# Patient Record
Sex: Female | Born: 1945 | Race: White | Hispanic: No | Smoking: Former smoker
Health system: Southern US, Community
[De-identification: ages and names within clinical notes are randomized; demographics above are authoritative.]

## PROBLEM LIST (undated history)

## (undated) ENCOUNTER — Ambulatory Visit: Admission: EM

## (undated) DIAGNOSIS — I639 Cerebral infarction, unspecified: Secondary | ICD-10-CM

## (undated) DIAGNOSIS — E1136 Type 2 diabetes mellitus with diabetic cataract: Secondary | ICD-10-CM

## (undated) DIAGNOSIS — K209 Esophagitis, unspecified without bleeding: Secondary | ICD-10-CM

## (undated) DIAGNOSIS — A0472 Enterocolitis due to Clostridium difficile, not specified as recurrent: Secondary | ICD-10-CM

## (undated) DIAGNOSIS — F419 Anxiety disorder, unspecified: Secondary | ICD-10-CM

## (undated) DIAGNOSIS — I739 Peripheral vascular disease, unspecified: Secondary | ICD-10-CM

## (undated) DIAGNOSIS — M1990 Unspecified osteoarthritis, unspecified site: Secondary | ICD-10-CM

## (undated) DIAGNOSIS — M869 Osteomyelitis, unspecified: Secondary | ICD-10-CM

## (undated) DIAGNOSIS — E538 Deficiency of other specified B group vitamins: Secondary | ICD-10-CM

## (undated) DIAGNOSIS — E782 Mixed hyperlipidemia: Secondary | ICD-10-CM

## (undated) DIAGNOSIS — J302 Other seasonal allergic rhinitis: Secondary | ICD-10-CM

## (undated) DIAGNOSIS — I10 Essential (primary) hypertension: Secondary | ICD-10-CM

## (undated) HISTORY — DX: Anxiety disorder, unspecified: F41.9

## (undated) HISTORY — PX: TONSILLECTOMY AND ADENOIDECTOMY: SUR1326

## (undated) HISTORY — DX: Esophagitis, unspecified without bleeding: K20.90

## (undated) HISTORY — DX: Cerebral infarction, unspecified: I63.9

## (undated) HISTORY — DX: Enterocolitis due to Clostridium difficile, not specified as recurrent: A04.72

## (undated) HISTORY — DX: Deficiency of other specified B group vitamins: E53.8

## (undated) HISTORY — PX: ABDOMINAL HYSTERECTOMY: SHX81

## (undated) HISTORY — DX: Peripheral vascular disease, unspecified: I73.9

## (undated) HISTORY — DX: Mixed hyperlipidemia: E78.2

## (undated) HISTORY — DX: Essential (primary) hypertension: I10

## (undated) HISTORY — DX: Type 2 diabetes mellitus with diabetic cataract: E11.36

---

## 1966-05-30 HISTORY — PX: PARTIAL HYMENECTOMY: SHX327

## 1985-05-30 HISTORY — PX: VAGINAL HYSTERECTOMY: SUR661

## 1988-05-30 DIAGNOSIS — I10 Essential (primary) hypertension: Secondary | ICD-10-CM

## 1988-05-30 HISTORY — DX: Essential (primary) hypertension: I10

## 2000-05-30 DIAGNOSIS — E538 Deficiency of other specified B group vitamins: Secondary | ICD-10-CM | POA: Insufficient documentation

## 2000-05-30 DIAGNOSIS — E1136 Type 2 diabetes mellitus with diabetic cataract: Secondary | ICD-10-CM

## 2000-05-30 HISTORY — DX: Type 2 diabetes mellitus with diabetic cataract: E11.36

## 2008-05-30 DIAGNOSIS — E782 Mixed hyperlipidemia: Secondary | ICD-10-CM

## 2008-05-30 DIAGNOSIS — I739 Peripheral vascular disease, unspecified: Secondary | ICD-10-CM | POA: Insufficient documentation

## 2008-05-30 HISTORY — DX: Mixed hyperlipidemia: E78.2

## 2015-07-15 DIAGNOSIS — M5020 Other cervical disc displacement, unspecified cervical region: Secondary | ICD-10-CM | POA: Diagnosis not present

## 2015-07-15 DIAGNOSIS — M5412 Radiculopathy, cervical region: Secondary | ICD-10-CM | POA: Diagnosis not present

## 2015-07-18 DIAGNOSIS — M5412 Radiculopathy, cervical region: Secondary | ICD-10-CM | POA: Diagnosis not present

## 2015-07-18 DIAGNOSIS — M5020 Other cervical disc displacement, unspecified cervical region: Secondary | ICD-10-CM | POA: Diagnosis not present

## 2015-07-19 DIAGNOSIS — M5020 Other cervical disc displacement, unspecified cervical region: Secondary | ICD-10-CM | POA: Diagnosis not present

## 2015-07-19 DIAGNOSIS — M5412 Radiculopathy, cervical region: Secondary | ICD-10-CM | POA: Diagnosis not present

## 2015-07-21 DIAGNOSIS — M5020 Other cervical disc displacement, unspecified cervical region: Secondary | ICD-10-CM | POA: Diagnosis not present

## 2015-07-21 DIAGNOSIS — M5412 Radiculopathy, cervical region: Secondary | ICD-10-CM | POA: Diagnosis not present

## 2015-07-22 DIAGNOSIS — M5412 Radiculopathy, cervical region: Secondary | ICD-10-CM | POA: Diagnosis not present

## 2015-07-22 DIAGNOSIS — M5020 Other cervical disc displacement, unspecified cervical region: Secondary | ICD-10-CM | POA: Diagnosis not present

## 2015-07-25 DIAGNOSIS — M5412 Radiculopathy, cervical region: Secondary | ICD-10-CM | POA: Diagnosis not present

## 2015-07-25 DIAGNOSIS — M5020 Other cervical disc displacement, unspecified cervical region: Secondary | ICD-10-CM | POA: Diagnosis not present

## 2015-07-29 DIAGNOSIS — M5412 Radiculopathy, cervical region: Secondary | ICD-10-CM | POA: Diagnosis not present

## 2015-07-29 DIAGNOSIS — M5020 Other cervical disc displacement, unspecified cervical region: Secondary | ICD-10-CM | POA: Diagnosis not present

## 2015-08-01 DIAGNOSIS — M5412 Radiculopathy, cervical region: Secondary | ICD-10-CM | POA: Diagnosis not present

## 2015-08-01 DIAGNOSIS — M5020 Other cervical disc displacement, unspecified cervical region: Secondary | ICD-10-CM | POA: Diagnosis not present

## 2015-08-05 DIAGNOSIS — M5020 Other cervical disc displacement, unspecified cervical region: Secondary | ICD-10-CM | POA: Diagnosis not present

## 2015-08-05 DIAGNOSIS — M5412 Radiculopathy, cervical region: Secondary | ICD-10-CM | POA: Diagnosis not present

## 2015-08-12 DIAGNOSIS — M5020 Other cervical disc displacement, unspecified cervical region: Secondary | ICD-10-CM | POA: Diagnosis not present

## 2015-08-12 DIAGNOSIS — M5412 Radiculopathy, cervical region: Secondary | ICD-10-CM | POA: Diagnosis not present

## 2015-08-13 DIAGNOSIS — M5412 Radiculopathy, cervical region: Secondary | ICD-10-CM | POA: Diagnosis not present

## 2015-08-13 DIAGNOSIS — M5020 Other cervical disc displacement, unspecified cervical region: Secondary | ICD-10-CM | POA: Diagnosis not present

## 2015-08-14 DIAGNOSIS — M5412 Radiculopathy, cervical region: Secondary | ICD-10-CM | POA: Diagnosis not present

## 2015-08-14 DIAGNOSIS — M5020 Other cervical disc displacement, unspecified cervical region: Secondary | ICD-10-CM | POA: Diagnosis not present

## 2015-08-19 DIAGNOSIS — M5412 Radiculopathy, cervical region: Secondary | ICD-10-CM | POA: Diagnosis not present

## 2015-08-19 DIAGNOSIS — M5020 Other cervical disc displacement, unspecified cervical region: Secondary | ICD-10-CM | POA: Diagnosis not present

## 2015-11-20 DIAGNOSIS — M9901 Segmental and somatic dysfunction of cervical region: Secondary | ICD-10-CM | POA: Diagnosis not present

## 2015-11-20 DIAGNOSIS — M5020 Other cervical disc displacement, unspecified cervical region: Secondary | ICD-10-CM | POA: Diagnosis not present

## 2015-11-25 DIAGNOSIS — B372 Candidiasis of skin and nail: Secondary | ICD-10-CM | POA: Diagnosis not present

## 2015-11-25 DIAGNOSIS — E1129 Type 2 diabetes mellitus with other diabetic kidney complication: Secondary | ICD-10-CM | POA: Diagnosis not present

## 2015-11-25 DIAGNOSIS — Z1389 Encounter for screening for other disorder: Secondary | ICD-10-CM | POA: Diagnosis not present

## 2015-11-25 DIAGNOSIS — Z6833 Body mass index (BMI) 33.0-33.9, adult: Secondary | ICD-10-CM | POA: Diagnosis not present

## 2015-11-25 DIAGNOSIS — E7800 Pure hypercholesterolemia, unspecified: Secondary | ICD-10-CM | POA: Diagnosis not present

## 2015-11-25 DIAGNOSIS — I10 Essential (primary) hypertension: Secondary | ICD-10-CM | POA: Diagnosis not present

## 2016-01-19 DIAGNOSIS — E1165 Type 2 diabetes mellitus with hyperglycemia: Secondary | ICD-10-CM | POA: Diagnosis not present

## 2016-01-19 DIAGNOSIS — E782 Mixed hyperlipidemia: Secondary | ICD-10-CM | POA: Diagnosis not present

## 2016-01-19 DIAGNOSIS — I10 Essential (primary) hypertension: Secondary | ICD-10-CM | POA: Diagnosis not present

## 2016-04-26 DIAGNOSIS — E1165 Type 2 diabetes mellitus with hyperglycemia: Secondary | ICD-10-CM | POA: Diagnosis not present

## 2016-04-26 DIAGNOSIS — E782 Mixed hyperlipidemia: Secondary | ICD-10-CM | POA: Diagnosis not present

## 2016-04-26 DIAGNOSIS — I10 Essential (primary) hypertension: Secondary | ICD-10-CM | POA: Diagnosis not present

## 2016-04-27 DIAGNOSIS — Z23 Encounter for immunization: Secondary | ICD-10-CM | POA: Diagnosis not present

## 2016-06-29 DIAGNOSIS — E1165 Type 2 diabetes mellitus with hyperglycemia: Secondary | ICD-10-CM | POA: Diagnosis not present

## 2016-08-08 DIAGNOSIS — E1165 Type 2 diabetes mellitus with hyperglycemia: Secondary | ICD-10-CM | POA: Diagnosis not present

## 2016-08-08 DIAGNOSIS — E782 Mixed hyperlipidemia: Secondary | ICD-10-CM | POA: Diagnosis not present

## 2016-08-08 DIAGNOSIS — I10 Essential (primary) hypertension: Secondary | ICD-10-CM | POA: Diagnosis not present

## 2016-08-31 DIAGNOSIS — B370 Candidal stomatitis: Secondary | ICD-10-CM | POA: Diagnosis not present

## 2016-08-31 DIAGNOSIS — J208 Acute bronchitis due to other specified organisms: Secondary | ICD-10-CM | POA: Diagnosis not present

## 2016-11-10 DIAGNOSIS — I10 Essential (primary) hypertension: Secondary | ICD-10-CM | POA: Diagnosis not present

## 2016-11-10 DIAGNOSIS — E782 Mixed hyperlipidemia: Secondary | ICD-10-CM | POA: Diagnosis not present

## 2016-11-10 DIAGNOSIS — E119 Type 2 diabetes mellitus without complications: Secondary | ICD-10-CM | POA: Diagnosis not present

## 2017-01-06 DIAGNOSIS — M19041 Primary osteoarthritis, right hand: Secondary | ICD-10-CM | POA: Diagnosis not present

## 2017-01-06 DIAGNOSIS — S6991XA Unspecified injury of right wrist, hand and finger(s), initial encounter: Secondary | ICD-10-CM | POA: Diagnosis not present

## 2017-01-06 DIAGNOSIS — M25531 Pain in right wrist: Secondary | ICD-10-CM | POA: Diagnosis not present

## 2017-01-06 DIAGNOSIS — M79641 Pain in right hand: Secondary | ICD-10-CM | POA: Diagnosis not present

## 2017-01-12 ENCOUNTER — Other Ambulatory Visit: Payer: Self-pay | Admitting: Pharmacy Technician

## 2017-01-12 NOTE — Patient Outreach (Signed)
[name] Care Management  01/12/2017  Lauren Craig Dec 28, 1945 [id number]  I attempted to contact patient in reference to medication adherence for [employer]. The medication in question is Atorvastatin 20 mg which was last filled 08/08/2016. I also wanted to inquire whether or not patient is aware of the 90 day option that is available to her which would save her a co-pay on her medication's. I left a voicemail and requested for the patient to return my call.  [name], [place] [phone]

## 2017-01-31 DIAGNOSIS — Z01818 Encounter for other preprocedural examination: Secondary | ICD-10-CM | POA: Diagnosis not present

## 2017-01-31 DIAGNOSIS — H25812 Combined forms of age-related cataract, left eye: Secondary | ICD-10-CM | POA: Diagnosis not present

## 2017-02-17 DIAGNOSIS — E782 Mixed hyperlipidemia: Secondary | ICD-10-CM | POA: Diagnosis not present

## 2017-02-17 DIAGNOSIS — I10 Essential (primary) hypertension: Secondary | ICD-10-CM | POA: Diagnosis not present

## 2017-02-17 DIAGNOSIS — Z794 Long term (current) use of insulin: Secondary | ICD-10-CM | POA: Diagnosis not present

## 2017-02-17 DIAGNOSIS — Z23 Encounter for immunization: Secondary | ICD-10-CM | POA: Diagnosis not present

## 2017-02-17 DIAGNOSIS — E1136 Type 2 diabetes mellitus with diabetic cataract: Secondary | ICD-10-CM | POA: Diagnosis not present

## 2017-02-17 DIAGNOSIS — E119 Type 2 diabetes mellitus without complications: Secondary | ICD-10-CM | POA: Diagnosis not present

## 2017-05-25 DIAGNOSIS — I10 Essential (primary) hypertension: Secondary | ICD-10-CM | POA: Diagnosis not present

## 2017-05-25 DIAGNOSIS — E782 Mixed hyperlipidemia: Secondary | ICD-10-CM | POA: Diagnosis not present

## 2017-05-25 DIAGNOSIS — E1136 Type 2 diabetes mellitus with diabetic cataract: Secondary | ICD-10-CM | POA: Diagnosis not present

## 2017-07-05 DIAGNOSIS — M79662 Pain in left lower leg: Secondary | ICD-10-CM | POA: Diagnosis not present

## 2017-07-05 DIAGNOSIS — M79661 Pain in right lower leg: Secondary | ICD-10-CM | POA: Diagnosis not present

## 2017-07-05 DIAGNOSIS — I10 Essential (primary) hypertension: Secondary | ICD-10-CM | POA: Diagnosis not present

## 2017-07-19 DIAGNOSIS — I7789 Other specified disorders of arteries and arterioles: Secondary | ICD-10-CM | POA: Diagnosis not present

## 2017-07-19 DIAGNOSIS — M79661 Pain in right lower leg: Secondary | ICD-10-CM | POA: Diagnosis not present

## 2017-07-19 DIAGNOSIS — I70211 Atherosclerosis of native arteries of extremities with intermittent claudication, right leg: Secondary | ICD-10-CM | POA: Diagnosis not present

## 2017-07-19 DIAGNOSIS — I70212 Atherosclerosis of native arteries of extremities with intermittent claudication, left leg: Secondary | ICD-10-CM | POA: Diagnosis not present

## 2017-07-24 ENCOUNTER — Encounter (HOSPITAL_COMMUNITY): Payer: Self-pay

## 2017-07-24 ENCOUNTER — Encounter: Payer: Self-pay | Admitting: Surgery

## 2017-07-24 DIAGNOSIS — E782 Mixed hyperlipidemia: Secondary | ICD-10-CM

## 2017-08-02 ENCOUNTER — Other Ambulatory Visit: Payer: Self-pay

## 2017-08-02 ENCOUNTER — Ambulatory Visit (INDEPENDENT_AMBULATORY_CARE_PROVIDER_SITE_OTHER): Payer: PPO | Admitting: Vascular Surgery

## 2017-08-02 ENCOUNTER — Encounter: Payer: Self-pay | Admitting: *Deleted

## 2017-08-02 ENCOUNTER — Other Ambulatory Visit: Payer: Self-pay | Admitting: *Deleted

## 2017-08-02 ENCOUNTER — Encounter: Payer: Self-pay | Admitting: Vascular Surgery

## 2017-08-02 VITALS — BP 131/79 | HR 97 | Temp 97.9°F | Resp 16 | Ht 65.5 in | Wt 176.0 lb

## 2017-08-02 DIAGNOSIS — I739 Peripheral vascular disease, unspecified: Secondary | ICD-10-CM

## 2017-08-02 NOTE — Progress Notes (Signed)
Patient name: Lauren Craig MRN: [id number] DOB: 11/11/1945 Sex: female   REASON FOR CONSULT:    Peripheral vascular disease.  The consult is requested by Darrol Jump, PA  HPI:   Lauren Craig is a pleasant 72 y.o. female, who was referred for evaluation of peripheral vascular disease. I have reviewed the records that were sent from the referring office.  The patient was seen on February 6.  The patient is followed with hypertension which has been under good control.  She was complaining of Claudication which initially began 1-2 months ago.  Her symptoms were progressing and therefore she sent for vascular consultation.  On my history she describes the gradual onset of pain mostly in the left leg which is brought on by ambulation and relieved with rest.  This involves her left thigh and calf.  This occurs at a very short distance.  The symptoms have gradually progressed over the last year.  She does have some occasional right calf claudication.  She describes pain in both lower extremities at night but given that the symptoms are equal on both sides are not convinced that this represents rest pain.  She denies any history of nonhealing wounds.  Her risk factors for peripheral vascular disease include diabetes, hypertension, and hypercholesterolemia.  She denies any family history of premature cardiovascular disease.  She is not a smoker.  She is on aspirin, a statin, and Xarelto.  It is not clear to me why she is on Xarelto.  I do not see any studies to suggest that she has a DVT.  Past Medical History:  Diagnosis Date  . Essential (primary) hypertension 1990  . Mixed hyperlipidemia 2010  . 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 Diabetes Mellitus II Mother   . Hypertension Father   . Heart Problems Brother        pacemaker  . Diabetes Mellitus II Brother   . Bipolar disorder Brother     SOCIAL  HISTORY: Social History   Socioeconomic History  . Marital status: Divorced    Spouse name: Not on file  . Number of children: 2  . Years of education: Not on file  . Highest education level: Not on file  Social Needs  . Financial resource strain: Not on file  . Food insecurity - worry: Not on file  . Food insecurity - inability: Not on file  . Transportation needs - medical: Not on file  . Transportation needs - non-medical: Not on file  Occupational History  . Occupation: substitute Product manager: [employer]  . 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 Smoking status: Former Smoker    Types: Cigarettes    Last attempt to quit: 2004    Years since quitting: 15.1  . Smokeless tobacco: Never Used  Substance and Sexual Activity  . Alcohol use: No    Frequency: Never  . Drug use: No  . Sexual activity: Not on file  Other Topics Concern  . Not on file  Social History Narrative  . Not on file    Allergies  Allergen Reactions  . Penicillins Rash    Current Outpatient Medications  Medication Sig Dispense Refill  . aspirin 325 MG EC tablet Take 325 mg by mouth daily.     Marland Kitchen atorvastatin (  LIPITOR) 20 MG tablet Take 20 mg by mouth Nightly.    . cyclobenzaprine (FLEXERIL) 10 MG tablet Take 10 mg by mouth at bedtime.    . enalapril (VASOTEC) 2.5 MG tablet Take 2.5 mg by mouth daily.    Marland Kitchen gabapentin (NEURONTIN) 100 MG capsule Take 100 mg by mouth 2 (two) times daily.    . insulin aspart (NOVOLOG) 100 UNIT/ML FlexPen Inject 30 Units into the skin 2 (two) times daily. Inject 30-35 units BID    . Insulin Glargine (LANTUS SOLOSTAR) 100 UNIT/ML Solostar Pen Inject 50 Units into the skin every morning.    . rivaroxaban (XARELTO) 2.5 MG TABS tablet Take 2.5 mg by mouth 2 (two) times daily.    Marland Kitchen triamcinolone cream (KENALOG) 0.1 % Apply 1 application topically 2 (two) times daily.    Marland Kitchen  albuterol (PROVENTIL HFA;VENTOLIN HFA) 108 (90 Base) MCG/ACT inhaler Inhale 2 puffs into the lungs every 6 (six) hours as needed for wheezing or shortness of breath.    . Fluticasone Furoate 50 MCG/ACT AEPB Place 1-2 sprays into both nostrils daily.     No current facility-administered medications for this visit.     REVIEW OF SYSTEMS:  [X]  denotes positive finding, [ ]  denotes negative finding Cardiac  Comments:  Chest pain or chest pressure:    Shortness of breath upon exertion:    Short of breath when lying flat:    Irregular heart rhythm:        Vascular    Pain in calf, thigh, or hip brought on by ambulation: x   Pain in feet at night that wakes you up from your sleep:  x   Blood clot in your veins:    Leg swelling:  x       Pulmonary    Oxygen at home:    Productive cough:     Wheezing:         Neurologic    Sudden weakness in arms or legs:     Sudden numbness in arms or legs:     Sudden onset of difficulty speaking or slurred speech:    Temporary loss of vision in one eye:     Problems with dizziness:         Gastrointestinal    Blood in stool:     Vomited blood:         Genitourinary    Burning when urinating:     Blood in urine:        Psychiatric    Major depression:         Hematologic    Bleeding problems:    Problems with blood clotting too easily:        Skin    Rashes or ulcers:        Constitutional    Fever or chills:     PHYSICAL EXAM:   Vitals:   08/02/17 1355  BP: 131/79  Pulse: 97  Resp: 16  Temp: 97.9 F (36.6 C)  TempSrc: Oral  SpO2: 95%  Weight: 176 lb (79.8 kg)  Height: 5' 5.5" (1.664 m)    GENERAL: The patient is a well-nourished female, in no acute distress. The vital signs are documented above. CARDIAC: There is a regular rate and rhythm.  VASCULAR: I do not detect carotid bruits. On the left side, which is the symptomatic side, I cannot palpate a femoral, popliteal, or pedal pulses. On the right side, there is a  palpable femoral, popliteal, and dorsalis pedis  pulse. PULMONARY: There is good air exchange bilaterally without wheezing or rales. ABDOMEN: Soft and non-tender with normal pitched bowel sounds.  MUSCULOSKELETAL: There are no major deformities or cyanosis. NEUROLOGIC: No focal weakness or paresthesias are detected. SKIN: There are no ulcers or rashes noted. PSYCHIATRIC: The patient has a normal affect.  DATA:    ARTERIAL DOPPLER STUDY: I have reviewed the arterial Doppler study that was done on 07/19/2017.  On the right side ABI is 80%.  On the left side ABI is 41%.  There are monophasic signals in the common femoral artery on the left suggesting iliac artery occlusive disease.  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  Her symptoms are quite disabling.  We have discussed the option of conservative treatment including a structured walking program.  However, she feels that her symptoms are not tolerable and that this is not an option.  For this reason I recommended that we proceed with arteriography.  I have reviewed with the patient the indications for arteriography. In addition, I have reviewed the potential complications of arteriography including but not limited to: Bleeding, arterial injury, arterial thrombosis, dye action, renal insufficiency, or other unpredictable medical problems. I have explained to the patient that if we find disease amenable to angioplasty we could potentially address this at the same time. I have discussed the potential complications of angioplasty and stenting, including but not limited to: Bleeding, arterial thrombosis, arterial injury, dissection, or the need for surgical intervention.  We will stop her Xarelto today.  Her angiogram is scheduled for 08/11/2017.  I will  make further recommendations pending these results.  If she is not a candidate for endovascular approach she might be a candidate for a right to left femorofemoral bypass.  We will need to check her renal function preoperatively.  I am unable to find any recent lab work.  [name] [facility] [phone]

## 2017-08-02 NOTE — H&P (View-Only) (Signed)
Patient name: Lauren Craig MRN: [id number] DOB: 08-10-1945 Sex: female   REASON FOR CONSULT:    Peripheral vascular disease.  The consult is requested by Darrol Jump, PA  HPI:   Lauren Craig is a pleasant 72 y.o. female, who was referred for evaluation of peripheral vascular disease. I have reviewed the records that were sent from the referring office.  The patient was seen on February 6.  The patient is followed with hypertension which has been under good control.  She was complaining of Claudication which initially began 1-2 months ago.  Her symptoms were progressing and therefore she sent for vascular consultation.  On my history she describes the gradual onset of pain mostly in the left leg which is brought on by ambulation and relieved with rest.  This involves her left thigh and calf.  This occurs at a very short distance.  The symptoms have gradually progressed over the last year.  She does have some occasional right calf claudication.  She describes pain in both lower extremities at night but given that the symptoms are equal on both sides are not convinced that this represents rest pain.  She denies any history of nonhealing wounds.  Her risk factors for peripheral vascular disease include diabetes, hypertension, and hypercholesterolemia.  She denies any family history of premature cardiovascular disease.  She is not a smoker.  She is on aspirin, a statin, and Xarelto.  It is not clear to me why she is on Xarelto.  I do not see any studies to suggest that she has a DVT.  Past Medical History:  Diagnosis Date  . Essential (primary) hypertension 1990  . Mixed hyperlipidemia 2010  . 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 Diabetes Mellitus II Mother   . Hypertension Father   . Heart Problems Brother        pacemaker  . Diabetes Mellitus II Brother   . Bipolar disorder Brother     SOCIAL  HISTORY: Social History   Socioeconomic History  . Marital status: Divorced    Spouse name: Not on file  . Number of children: 2  . Years of education: Not on file  . Highest education level: Not on file  Social Needs  . Financial resource strain: Not on file  . Food insecurity - worry: Not on file  . Food insecurity - inability: Not on file  . Transportation needs - medical: Not on file  . Transportation needs - non-medical: Not on file  Occupational History  . Occupation: substitute Product manager: [employer]  . 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 Smoking status: Former Smoker    Types: Cigarettes    Last attempt to quit: 2004    Years since quitting: 15.1  . Smokeless tobacco: Never Used  Substance and Sexual Activity  . Alcohol use: No    Frequency: Never  . Drug use: No  . Sexual activity: Not on file  Other Topics Concern  . Not on file  Social History Narrative  . Not on file    Allergies  Allergen Reactions  . Penicillins Rash    Current Outpatient Medications  Medication Sig Dispense Refill  . aspirin 325 MG EC tablet Take 325 mg by mouth daily.     Marland Kitchen atorvastatin (  LIPITOR) 20 MG tablet Take 20 mg by mouth Nightly.    . cyclobenzaprine (FLEXERIL) 10 MG tablet Take 10 mg by mouth at bedtime.    . enalapril (VASOTEC) 2.5 MG tablet Take 2.5 mg by mouth daily.    Marland Kitchen gabapentin (NEURONTIN) 100 MG capsule Take 100 mg by mouth 2 (two) times daily.    . insulin aspart (NOVOLOG) 100 UNIT/ML FlexPen Inject 30 Units into the skin 2 (two) times daily. Inject 30-35 units BID    . Insulin Glargine (LANTUS SOLOSTAR) 100 UNIT/ML Solostar Pen Inject 50 Units into the skin every morning.    . rivaroxaban (XARELTO) 2.5 MG TABS tablet Take 2.5 mg by mouth 2 (two) times daily.    Marland Kitchen triamcinolone cream (KENALOG) 0.1 % Apply 1 application topically 2 (two) times daily.    Marland Kitchen  albuterol (PROVENTIL HFA;VENTOLIN HFA) 108 (90 Base) MCG/ACT inhaler Inhale 2 puffs into the lungs every 6 (six) hours as needed for wheezing or shortness of breath.    . Fluticasone Furoate 50 MCG/ACT AEPB Place 1-2 sprays into both nostrils daily.     No current facility-administered medications for this visit.     REVIEW OF SYSTEMS:  [X]  denotes positive finding, [ ]  denotes negative finding Cardiac  Comments:  Chest pain or chest pressure:    Shortness of breath upon exertion:    Short of breath when lying flat:    Irregular heart rhythm:        Vascular    Pain in calf, thigh, or hip brought on by ambulation: x   Pain in feet at night that wakes you up from your sleep:  x   Blood clot in your veins:    Leg swelling:  x       Pulmonary    Oxygen at home:    Productive cough:     Wheezing:         Neurologic    Sudden weakness in arms or legs:     Sudden numbness in arms or legs:     Sudden onset of difficulty speaking or slurred speech:    Temporary loss of vision in one eye:     Problems with dizziness:         Gastrointestinal    Blood in stool:     Vomited blood:         Genitourinary    Burning when urinating:     Blood in urine:        Psychiatric    Major depression:         Hematologic    Bleeding problems:    Problems with blood clotting too easily:        Skin    Rashes or ulcers:        Constitutional    Fever or chills:     PHYSICAL EXAM:   Vitals:   08/02/17 1355  BP: 131/79  Pulse: 97  Resp: 16  Temp: 97.9 F (36.6 C)  TempSrc: Oral  SpO2: 95%  Weight: 176 lb (79.8 kg)  Height: 5' 5.5" (1.664 m)    GENERAL: The patient is a well-nourished female, in no acute distress. The vital signs are documented above. CARDIAC: There is a regular rate and rhythm.  VASCULAR: I do not detect carotid bruits. On the left side, which is the symptomatic side, I cannot palpate a femoral, popliteal, or pedal pulses. On the right side, there is a  palpable femoral, popliteal, and dorsalis pedis  pulse. PULMONARY: There is good air exchange bilaterally without wheezing or rales. ABDOMEN: Soft and non-tender with normal pitched bowel sounds.  MUSCULOSKELETAL: There are no major deformities or cyanosis. NEUROLOGIC: No focal weakness or paresthesias are detected. SKIN: There are no ulcers or rashes noted. PSYCHIATRIC: The patient has a normal affect.  DATA:    ARTERIAL DOPPLER STUDY: I have reviewed the arterial Doppler study that was done on 07/19/2017.  On the right side ABI is 80%.  On the left side ABI is 41%.  There are monophasic signals in the common femoral artery on the left suggesting iliac artery occlusive disease.  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  Her symptoms are quite disabling.  We have discussed the option of conservative treatment including a structured walking program.  However, she feels that her symptoms are not tolerable and that this is not an option.  For this reason I recommended that we proceed with arteriography.  I have reviewed with the patient the indications for arteriography. In addition, I have reviewed the potential complications of arteriography including but not limited to: Bleeding, arterial injury, arterial thrombosis, dye action, renal insufficiency, or other unpredictable medical problems. I have explained to the patient that if we find disease amenable to angioplasty we could potentially address this at the same time. I have discussed the potential complications of angioplasty and stenting, including but not limited to: Bleeding, arterial thrombosis, arterial injury, dissection, or the need for surgical intervention.  We will stop her Xarelto today.  Her angiogram is scheduled for 08/11/2017.  I will  make further recommendations pending these results.  If she is not a candidate for endovascular approach she might be a candidate for a right to left femorofemoral bypass.  We will need to check her renal function preoperatively.  I am unable to find any recent lab work.  [name] [facility] [phone]

## 2017-08-07 ENCOUNTER — Encounter: Payer: Self-pay | Admitting: Medical

## 2017-08-11 ENCOUNTER — Encounter (HOSPITAL_COMMUNITY)
Admission: RE | Disposition: A | Discharge status: Home / Self Care | Payer: Self-pay | Source: Ambulatory Visit | Attending: Vascular Surgery

## 2017-08-11 ENCOUNTER — Ambulatory Visit (HOSPITAL_COMMUNITY)
Admission: RE | Admit: 2017-08-11 | Discharge: 2017-08-11 | Disposition: A | Discharge status: Home / Self Care | Payer: PPO | Source: Ambulatory Visit | Attending: Vascular Surgery | Admitting: Vascular Surgery

## 2017-08-11 DIAGNOSIS — E782 Mixed hyperlipidemia: Secondary | ICD-10-CM | POA: Insufficient documentation

## 2017-08-11 DIAGNOSIS — E669 Obesity, unspecified: Secondary | ICD-10-CM | POA: Insufficient documentation

## 2017-08-11 DIAGNOSIS — I70212 Atherosclerosis of native arteries of extremities with intermittent claudication, left leg: Secondary | ICD-10-CM | POA: Diagnosis not present

## 2017-08-11 DIAGNOSIS — Z794 Long term (current) use of insulin: Secondary | ICD-10-CM | POA: Insufficient documentation

## 2017-08-11 DIAGNOSIS — E1136 Type 2 diabetes mellitus with diabetic cataract: Secondary | ICD-10-CM | POA: Insufficient documentation

## 2017-08-11 DIAGNOSIS — Z7982 Long term (current) use of aspirin: Secondary | ICD-10-CM | POA: Diagnosis not present

## 2017-08-11 DIAGNOSIS — I745 Embolism and thrombosis of iliac artery: Secondary | ICD-10-CM | POA: Diagnosis not present

## 2017-08-11 DIAGNOSIS — E1151 Type 2 diabetes mellitus with diabetic peripheral angiopathy without gangrene: Secondary | ICD-10-CM | POA: Insufficient documentation

## 2017-08-11 DIAGNOSIS — Z87891 Personal history of nicotine dependence: Secondary | ICD-10-CM | POA: Diagnosis not present

## 2017-08-11 DIAGNOSIS — I708 Atherosclerosis of other arteries: Secondary | ICD-10-CM | POA: Diagnosis not present

## 2017-08-11 DIAGNOSIS — I10 Essential (primary) hypertension: Secondary | ICD-10-CM | POA: Diagnosis not present

## 2017-08-11 DIAGNOSIS — I739 Peripheral vascular disease, unspecified: Secondary | ICD-10-CM | POA: Diagnosis present

## 2017-08-11 DIAGNOSIS — Z8249 Family history of ischemic heart disease and other diseases of the circulatory system: Secondary | ICD-10-CM | POA: Insufficient documentation

## 2017-08-11 DIAGNOSIS — Z7901 Long term (current) use of anticoagulants: Secondary | ICD-10-CM | POA: Insufficient documentation

## 2017-08-11 DIAGNOSIS — Z79899 Other long term (current) drug therapy: Secondary | ICD-10-CM | POA: Insufficient documentation

## 2017-08-11 DIAGNOSIS — Z833 Family history of diabetes mellitus: Secondary | ICD-10-CM | POA: Insufficient documentation

## 2017-08-11 HISTORY — PX: ABDOMINAL AORTOGRAM W/LOWER EXTREMITY: CATH118223

## 2017-08-11 LAB — GLUCOSE, CAPILLARY
Glucose-Capillary: 266 mg/dL — ABNORMAL HIGH (ref 65–99)
Glucose-Capillary: 219 mg/dL — ABNORMAL HIGH (ref 65–99)

## 2017-08-11 LAB — POCT I-STAT, CHEM 8
BUN: 13 mg/dL (ref 6–20)
CREATININE: 0.6 mg/dL (ref 0.44–1.00)
Calcium, Ion: 1.14 mmol/L — ABNORMAL LOW (ref 1.15–1.40)
Chloride: 101 mmol/L (ref 101–111)
Glucose, Bld: 341 mg/dL — ABNORMAL HIGH (ref 65–99)
HEMATOCRIT: 43 % (ref 36.0–46.0)
HEMOGLOBIN: 14.6 g/dL (ref 12.0–15.0)
POTASSIUM: 4.1 mmol/L (ref 3.5–5.1)
SODIUM: 136 mmol/L (ref 135–145)
TCO2: 25 mmol/L (ref 22–32)

## 2017-08-11 SURGERY — ABDOMINAL AORTOGRAM W/LOWER EXTREMITY
Anesthesia: LOCAL

## 2017-08-11 MED ORDER — SODIUM CHLORIDE 0.9% FLUSH: 3.0000 mL | Freq: Two times a day (BID) | INTRAVENOUS | Status: DC

## 2017-08-11 MED ORDER — LABETALOL HCL 5 MG/ML IV SOLN: 10.0000 mg | INTRAVENOUS | Status: DC | PRN

## 2017-08-11 MED ORDER — SODIUM CHLORIDE 0.9 % IV SOLN: 250.0000 mL | INTRAVENOUS | Status: DC | PRN

## 2017-08-11 MED ORDER — INSULIN ASPART 100 UNIT/ML ~~LOC~~ SOLN
SUBCUTANEOUS | Status: AC
  Filled 2017-08-11: qty 1

## 2017-08-11 MED ORDER — HYDRALAZINE HCL 20 MG/ML IJ SOLN: 5.0000 mg | INTRAMUSCULAR | Status: DC | PRN

## 2017-08-11 MED ORDER — MIDAZOLAM HCL 2 MG/2ML IJ SOLN
INTRAMUSCULAR | Status: AC
  Filled 2017-08-11: qty 2

## 2017-08-11 MED ORDER — IODIXANOL 320 MG/ML IV SOLN
INTRAVENOUS | Status: DC | PRN
  Administered 2017-08-11: 150 mL via INTRA_ARTERIAL

## 2017-08-11 MED ORDER — INSULIN ASPART 100 UNIT/ML ~~LOC~~ SOLN
8.0000 [IU] | Freq: Once | SUBCUTANEOUS | Status: AC
  Administered 2017-08-11: 8 [IU] via SUBCUTANEOUS
  Filled 2017-08-11: qty 0.08

## 2017-08-11 MED ORDER — LIDOCAINE HCL (PF) 1 % IJ SOLN
INTRAMUSCULAR | Status: DC | PRN
  Administered 2017-08-11 (×2): 10 mL

## 2017-08-11 MED ORDER — FENTANYL CITRATE (PF) 100 MCG/2ML IJ SOLN
INTRAMUSCULAR | Status: DC | PRN
  Administered 2017-08-11: 50 ug via INTRAVENOUS

## 2017-08-11 MED ORDER — HEPARIN (PORCINE) IN NACL 2-0.9 UNIT/ML-% IJ SOLN
INTRAMUSCULAR | Status: AC
  Filled 2017-08-11: qty 1000

## 2017-08-11 MED ORDER — FENTANYL CITRATE (PF) 100 MCG/2ML IJ SOLN
INTRAMUSCULAR | Status: AC
  Filled 2017-08-11: qty 2

## 2017-08-11 MED ORDER — LIDOCAINE HCL 1 % IJ SOLN
INTRAMUSCULAR | Status: AC
  Filled 2017-08-11: qty 20

## 2017-08-11 MED ORDER — MIDAZOLAM HCL 2 MG/2ML IJ SOLN
INTRAMUSCULAR | Status: DC | PRN
  Administered 2017-08-11 (×2): 1 mg via INTRAVENOUS

## 2017-08-11 MED ORDER — SODIUM CHLORIDE 0.9 % IV SOLN
INTRAVENOUS | Status: DC
  Administered 2017-08-11: 08:00:00 via INTRAVENOUS

## 2017-08-11 MED ORDER — HEPARIN (PORCINE) IN NACL 2-0.9 UNIT/ML-% IJ SOLN
INTRAMUSCULAR | Status: AC | PRN
  Administered 2017-08-11 (×2): 500 mL via INTRA_ARTERIAL

## 2017-08-11 MED ORDER — SODIUM CHLORIDE 0.9 % WEIGHT BASED INFUSION: 1.0000 mL/kg/h | INTRAVENOUS | Status: DC

## 2017-08-11 MED ORDER — SODIUM CHLORIDE 0.9% FLUSH: 3.0000 mL | INTRAVENOUS | Status: DC | PRN

## 2017-08-11 SURGICAL SUPPLY — 18 items
BALLN MUSTANG 4.0X40 75 (BALLOONS) ×2
BALLOON MUSTANG 4.0X40 75 (BALLOONS) ×1 IMPLANT
CATH ANGIO 5F BER2 65CM (CATHETERS) ×2 IMPLANT
CATH ANGIO 5F PIGTAIL 100CM (CATHETERS) ×2 IMPLANT
CATH OMNI FLUSH 5F 65CM (CATHETERS) ×2 IMPLANT
COVER PRB 48X5XTLSCP FOLD TPE (BAG) ×1 IMPLANT
COVER PROBE 5X48 (BAG) ×1
KIT ENCORE 26 ADVANTAGE (KITS) ×2 IMPLANT
KIT MICROINTRODUCER 5F 7206 (SHEATH) ×4 IMPLANT
KIT PV (KITS) ×2 IMPLANT
SHEATH AVANTI 11CM 5FR (SHEATH) ×6 IMPLANT
SYRINGE MEDRAD AVANTA MACH 7 (SYRINGE) ×2 IMPLANT
TRANSDUCER W/STOPCOCK (MISCELLANEOUS) ×2 IMPLANT
TRAY PV CATH (CUSTOM PROCEDURE TRAY) ×2 IMPLANT
WIRE AQUATRAK .035X150 ANG (WIRE) ×2 IMPLANT
WIRE BENTSON .035X145CM (WIRE) ×2 IMPLANT
WIRE HI TORQ VERSACORE J 260CM (WIRE) ×6 IMPLANT
WIRE TORQFLEX AUST .018X40CM (WIRE) ×2 IMPLANT

## 2017-08-11 NOTE — Discharge Instructions (Signed)
**Note -identified via Obfuscation** Femoral Site Care °Refer to this sheet in the next few weeks. These instructions provide you with information about caring for yourself after your procedure. Your health care provider may also give you more specific instructions. Your treatment has been planned according to current medical practices, but problems sometimes occur. Call your health care provider if you have any problems or questions after your procedure. °What can I expect after the procedure? °After your procedure, it is typical to have the following: °· Bruising at the site that usually fades within 1-2 weeks. °· Blood collecting in the tissue (hematoma) that may be painful to the touch. It should usually decrease in size and tenderness within 1-2 weeks. ° °Follow these instructions at home: °· Take medicines only as directed by your health care provider. °· You may shower 24-48 hours after the procedure or as directed by your health care provider. Remove the bandage (dressing) and gently wash the site with plain soap and water. Pat the area dry with a clean towel. Do not rub the site, because this may cause bleeding. °· Do not take baths, swim, or use a hot tub until your health care provider approves. °· Check your insertion site every day for redness, swelling, or drainage. °· Do not apply powder or lotion to the site. °· Limit use of stairs to twice a day for the first 2-3 days or as directed by your health care provider. °· Do not squat for the first 2-3 days or as directed by your health care provider. °· Do not lift over 10 lb (4.5 kg) for 5 days after your procedure or as directed by your health care provider. °· Ask your health care provider when it is okay to: °? Return to work or school. °? Resume usual physical activities or sports. °? Resume sexual activity. °· Do not drive home if you are discharged the same day as the procedure. Have someone else drive you. °· You may drive 24 hours after the procedure unless otherwise instructed by  your health care provider. °· Do not operate machinery or power tools for 24 hours after the procedure or as directed by your health care provider. °· If your procedure was done as an outpatient procedure, which means that you went home the same day as your procedure, a responsible adult should be with you for the first 24 hours after you arrive home. °· Keep all follow-up visits as directed by your health care provider. This is important. °Contact a health care provider if: °· You have a fever. °· You have chills. °· You have increased bleeding from the site. Hold pressure on the site. °Get help right away if: °· You have unusual pain at the site. °· You have redness, warmth, or swelling at the site. °· You have drainage (other than a small amount of blood on the dressing) from the site. °· The site is bleeding, and the bleeding does not stop after 30 minutes of holding steady pressure on the site. °· Your leg or foot becomes pale, cool, tingly, or numb. °This information is not intended to replace advice given to you by your health care provider. Make sure you discuss any questions you have with your health care provider. °Document Released: 01/17/2014 Document Revised: 10/22/2015 Document Reviewed: 12/03/2013 °Elsevier Interactive Patient Education © 2018 [employer] ° °

## 2017-08-11 NOTE — Interval H&P Note (Signed)
History and Physical Interval Note:  08/11/2017 9:05 AM  Lauren Craig  has presented today for surgery, with the diagnosis of claudication  The various methods of treatment have been discussed with the patient and family. After consideration of risks, benefits and other options for treatment, the patient has consented to  Procedure(s): ABDOMINAL AORTOGRAM W/LOWER EXTREMITY (N/A) as a surgical intervention .  The patient's history has been reviewed, patient examined, no change in status, stable for surgery.  I have reviewed the patient's chart and labs.  Questions were answered to the patient's satisfaction.     [name]

## 2017-08-11 NOTE — Progress Notes (Signed)
49fr sheath aspirated and removed from rfa. Manual pressure applied for 20 minutes. Groin level 0, no s+s of hematoma. Tegaderm dressing applied, bedrest instructions given. Bilateral dp pulses palpable.   Bedrest begins at 11:27:00

## 2017-08-11 NOTE — Op Note (Signed)
PATIENT: Lauren Craig      MRN: [id number] DOB: Sep 14, 1945    DATE OF PROCEDURE: 08/11/2017  INDICATIONS:    Lauren Craig is a 72 y.o. female who presents with disabling claudication on the left side.  We had discussed conservative treatment however she wished to pursue revascularization.  Therefore she presents for elective arteriography and possible intervention.  PROCEDURE:    1.  Ultrasound-guided access to the right common femoral artery 2.  Arch aortogram 3.  Aortogram with bilateral iliac arteriogram and bilateral lower extremity runoff 4.  Conscious sedation  SURGEON: Judeth Cornfield. Scot Dock, MD, FACS  ANESTHESIA: Local with sedation  EBL: Minimal  TECHNIQUE: The patient was taken to the peripheral vascular lab and was sedated. The period of conscious sedation was 51 1 mg of Versed and 50 mcg of fentanyl minutes.  During that time period, I was present face-to-face 100% of the time.  The patient was administered 1 mg of Versed and 50 mcg of fentanyl.. The patient's heart rate, blood pressure, and oxygen saturation were monitored by the nurse continuously during the procedure.  Both groins were prepped and draped in usual sterile fashion.  Under ultrasound guidance, after the skin was anesthetized, the right common femoral artery was cannulated with a micropuncture needle and a micropuncture sheath introduced over a wire.  I then advanced the Bentson wire but this would not advanced to the common iliac artery.  I used a Berenstein 2 catheter and an angled Glidewire to get the wire up into the infrarenal aorta.  A pigtail catheter was then positioned in the ascending aortic arch and arch aortogram was obtained.  This was done in case the patient required axillobifemoral bypass grafting in order to assess the inflow of the left subclavian artery.  Next the catheter was retracted to the L1 vertebral body level and aortogram was obtained with bilateral iliac arteriogram and bilateral  lower extremity runoff.  I felt that it would be reasonable to attempt cannulation of the left groin to try to used to cross the occluded left common iliac artery, however, there was a significant plaque in the common femoral artery and I attempted twice with a micropuncture needle but the wire would not advanced and it therefore I did not think it was safe to proceed.  In order to stick above the plaque would require a fairly high stick and risk of retroperitoneal hematoma.  The wire in the right was then removed.  The patient was transferred to the holding area for removal of the sheath.  No immediate complications were noted.   FINDINGS:   1.  The aortic arch is widely patent.  The innominate, left common carotid artery, and left subclavian artery are patent without significant stenosis. 2.  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  The renal arteries are patent without significant renal artery stenosis identified. 3.  On the left side, which is the more symptomatic side, the common iliac artery is occluded at its origin with reconstitution of the distal common iliac artery.  The external iliac artery is patent.  There is an eccentric calcific plaque in the left common femoral artery.  The deep femoral artery and superficial femoral arteries are patent.  The popliteal, anterior tibial, and peroneal arteries are patent on the left.  The posterior tibial artery in the left is occluded. On the right side, there is a calcific plaque4.  Producing significant stenosis in the right common iliac artery.  The iliac  artery below that is patent.  The hypogastric artery and external iliac artery and the right are patent.  The common femoral, superficial femoral, popliteal, anterior tibial, and peroneal arteries are patent on the right.  The posterior tibial artery is occluded on the right.    CLINICAL NOTE: Given this patient's age, obesity, and diabetes, I would favor a conservative approach.   However if she felt that we needed to proceed with revascularization I think it would be reasonable to perform a left femoral endarterectomy and then bilateral kissing balloon stents with covered stents if I was able to successfully cross the left common iliac artery occlusion.  Otherwise she might potentially be a candidate for an axillobifemoral bypass.  My primary concern however is infection especially in the groins given her obesity and diabetes.  [name], MD, FACS [facility]  DATE OF DICTATION:   08/11/2017

## 2017-08-14 ENCOUNTER — Telehealth: Payer: Self-pay | Admitting: Vascular Surgery

## 2017-08-14 ENCOUNTER — Encounter (HOSPITAL_COMMUNITY): Payer: Self-pay | Admitting: Vascular Surgery

## 2017-08-14 MED FILL — Lidocaine HCl Local Inj 1%: INTRAMUSCULAR | Qty: 20 | Status: AC

## 2017-08-14 MED FILL — Heparin Sodium (Porcine) 2 Unit/ML in Sodium Chloride 0.9%: INTRAMUSCULAR | Qty: 1000 | Status: AC

## 2017-08-14 NOTE — Telephone Encounter (Signed)
Spoke to pt and confirmed 09/13/17 appt. Mailed letter.

## 2017-08-14 NOTE — Telephone Encounter (Signed)
-----   Message from Mena Goes, RN sent at 08/11/2017  2:02 PM EDT ----- Regarding: 3-4 weeks   ----- Message ----- From: Angelia Mould, MD Sent: 08/11/2017  10:54 AM To: [facility] Charge Pool Subject: charge                                          PROCEDURE:   1.  Ultrasound-guided access to the right common femoral artery 2.  Arch aortogram 3.  Aortogram with bilateral iliac arteriogram and bilateral lower extremity runoff 4.  Conscious sedation  SURGEON: Judeth Cornfield. Scot Dock, MD, FACS  I need to save see this patient in follow-up in 3-4 weeks to discuss conservative treatment versus surgery.  Thank you. CD

## 2017-08-16 ENCOUNTER — Encounter: Payer: PPO | Admitting: Vascular Surgery

## 2017-08-24 ENCOUNTER — Encounter: Payer: Self-pay | Admitting: Vascular Surgery

## 2017-08-24 DIAGNOSIS — E782 Mixed hyperlipidemia: Secondary | ICD-10-CM | POA: Diagnosis not present

## 2017-08-24 DIAGNOSIS — I7389 Other specified peripheral vascular diseases: Secondary | ICD-10-CM | POA: Diagnosis not present

## 2017-08-24 DIAGNOSIS — I10 Essential (primary) hypertension: Secondary | ICD-10-CM | POA: Diagnosis not present

## 2017-08-24 DIAGNOSIS — E1151 Type 2 diabetes mellitus with diabetic peripheral angiopathy without gangrene: Secondary | ICD-10-CM | POA: Diagnosis not present

## 2017-09-13 ENCOUNTER — Other Ambulatory Visit: Payer: Self-pay

## 2017-09-13 ENCOUNTER — Ambulatory Visit (INDEPENDENT_AMBULATORY_CARE_PROVIDER_SITE_OTHER): Payer: PPO | Admitting: Vascular Surgery

## 2017-09-13 ENCOUNTER — Encounter: Payer: Self-pay | Admitting: Vascular Surgery

## 2017-09-13 VITALS — BP 151/86 | HR 84 | Temp 97.1°F | Resp 20 | Ht 65.5 in | Wt 176.0 lb

## 2017-09-13 DIAGNOSIS — I739 Peripheral vascular disease, unspecified: Secondary | ICD-10-CM | POA: Diagnosis not present

## 2017-09-13 NOTE — Progress Notes (Signed)
Patient name: Lauren Craig MRN: [id number] DOB: [birth year] Sex: female  REASON FOR VISIT:   Follow-up after arteriography.  HPI:   Lauren Craig is a pleasant 72 y.o. female who presented with disabling claudication of the left lower extremity.  We discussed conservative treatment however she wished to pursue a more aggressive approach.  She underwent an arteriogram on 08/11/2017.  This showed that on the left side, which is the more symptomatic side, the common iliac artery is occluded at its origin with reconstitution of the distal common iliac artery.  The external iliac artery is patent.  There is eccentric calcific plaque in the left common femoral artery.  The deep femoral artery and superficial femoral artery are patent.  The popliteal, anterior tibial, and peroneal arteries are patent on the left.  The posterior tibial artery was occluded.  Given the patient's age, obesity, and diabetes, I favored a more conservative approach.  However I felt that if she needed to consider revascularization it would be reasonable to perform a left femoral endarterectomy and then bilateral kissing balloon stents with covered stents if I was able to successfully cross the lot left common iliac artery occlusion.  Otherwise she might be a candidate for axillobifemoral bypass.  My main concern was the risk of infection in her groins given her obesity and diabetes.  The patient continues to have left lower extremity claudication which is brought on by ambulation and relieved with rest.  I do not get any history of rest pain.  She admits having some pain on the medial aspect of her left foot which sounds like it could potentially be tendinitis.  She denies any nonhealing ulcers.  The patient has become very interested in her health and his obtained to look Dr. Caren Macadam books.  She is pursuing a plant-based diet.  She is also joined [employer] and is starting to exercise daily.  She seems highly motivated to  improve her health.  Current Outpatient Medications  Medication Sig Dispense Refill  . albuterol (PROVENTIL HFA;VENTOLIN HFA) 108 (90 Base) MCG/ACT inhaler Inhale 2 puffs into the lungs every 6 (six) hours as needed for wheezing or shortness of breath.    Marland Kitchen aspirin 325 MG EC tablet Take 325 mg by mouth 2 (two) times daily.     Marland Kitchen atorvastatin (LIPITOR) 20 MG tablet Take 20 mg by mouth at bedtime.     . cyclobenzaprine (FLEXERIL) 5 MG tablet Take 5 mg by mouth 3 (three) times daily as needed for muscle spasms.    . enalapril (VASOTEC) 5 MG tablet Take 2.5 mg by mouth daily.    . fluticasone (FLONASE) 50 MCG/ACT nasal spray Place 1 spray into both nostrils daily as needed for allergies or rhinitis.    Marland Kitchen gabapentin (NEURONTIN) 100 MG capsule Take 100 mg by mouth 2 (two) times daily.    . insulin aspart (NOVOLOG) 100 UNIT/ML FlexPen Inject 25 Units into the skin 2 (two) times daily before a meal.     . Insulin Glargine (LANTUS SOLOSTAR) 100 UNIT/ML Solostar Pen Inject 50 Units into the skin at bedtime.     . triamcinolone cream (KENALOG) 0.1 % Apply 1 application topically daily as needed (for rash).      No current facility-administered medications for this visit.     REVIEW OF SYSTEMS:  [X]  denotes positive finding, [ ]  denotes negative finding Cardiac  Comments:  Chest pain or chest pressure:    Shortness of breath upon exertion:  Short of breath when lying flat:    Irregular heart rhythm:    Constitutional    Fever or chills:     PHYSICAL EXAM:   Vitals:   09/13/17 1546  BP: (!) 151/86  Pulse: 84  Resp: 20  Temp: (!) 97.1 F (36.2 C)  TempSrc: Oral  SpO2: 94%  Weight: 176 lb (79.8 kg)  Height: 5' 5.5" (1.664 m)    GENERAL: The patient is a well-nourished female, in no acute distress. The vital signs are documented above. CARDIOVASCULAR: There is a regular rate and rhythm. PULMONARY: There is good air exchange bilaterally without wheezing or rales. VASCULAR: The patient  has a diminished right femoral pulse  and a barely palpable right dorsalis pedis pulse. There is no left femoral pulse and no distal pulses on the left. There is no erythema of the left foot or ulcers.  DATA:   ARTERIOGRAM: I was unable to retrieve her images from her arteriogram in March in the office today.  I suspect the images were not sent to the server.  MEDICAL ISSUES:   MULTILEVEL ARTERIAL OCCLUSIVE DISEASE: This patient seems very highly motivated to begin exercising and work on her nutrition.  I think there is a reasonable chance that we will get by without surgery.  However if her symptoms progress I think the best option would be to consider left femoral endarterectomy and then kissing balloon stents in the iliac arteries.  Otherwise she might be a candidate for axillobifemoral bypass.  Alternatively we could address the iliac disease on the right and she can be a candidate potentially for a right to left femorofemoral bypass.  However she seems very motivated to try to avoid surgery.  I will see her back in 6 months.  She knows to call sooner if she has problems.  She is on aspirin and is on a statin.  [name] [facility] [phone]

## 2017-09-14 ENCOUNTER — Other Ambulatory Visit: Payer: Self-pay

## 2017-09-14 DIAGNOSIS — I739 Peripheral vascular disease, unspecified: Secondary | ICD-10-CM

## 2017-10-03 DIAGNOSIS — E118 Type 2 diabetes mellitus with unspecified complications: Secondary | ICD-10-CM | POA: Insufficient documentation

## 2017-10-03 DIAGNOSIS — E1165 Type 2 diabetes mellitus with hyperglycemia: Secondary | ICD-10-CM | POA: Diagnosis not present

## 2017-10-03 DIAGNOSIS — I10 Essential (primary) hypertension: Secondary | ICD-10-CM | POA: Insufficient documentation

## 2017-10-03 DIAGNOSIS — E785 Hyperlipidemia, unspecified: Secondary | ICD-10-CM | POA: Insufficient documentation

## 2017-10-30 DIAGNOSIS — E785 Hyperlipidemia, unspecified: Secondary | ICD-10-CM | POA: Diagnosis not present

## 2017-10-30 DIAGNOSIS — E1165 Type 2 diabetes mellitus with hyperglycemia: Secondary | ICD-10-CM | POA: Diagnosis not present

## 2017-10-30 DIAGNOSIS — Z794 Long term (current) use of insulin: Secondary | ICD-10-CM | POA: Diagnosis not present

## 2017-10-30 DIAGNOSIS — I10 Essential (primary) hypertension: Secondary | ICD-10-CM | POA: Diagnosis not present

## 2017-11-14 DIAGNOSIS — E119 Type 2 diabetes mellitus without complications: Secondary | ICD-10-CM | POA: Diagnosis not present

## 2017-11-14 DIAGNOSIS — H25812 Combined forms of age-related cataract, left eye: Secondary | ICD-10-CM | POA: Diagnosis not present

## 2017-11-15 DIAGNOSIS — M79675 Pain in left toe(s): Secondary | ICD-10-CM | POA: Diagnosis not present

## 2017-11-15 DIAGNOSIS — F411 Generalized anxiety disorder: Secondary | ICD-10-CM | POA: Diagnosis not present

## 2017-11-17 DIAGNOSIS — H2512 Age-related nuclear cataract, left eye: Secondary | ICD-10-CM | POA: Diagnosis not present

## 2017-11-17 DIAGNOSIS — H25812 Combined forms of age-related cataract, left eye: Secondary | ICD-10-CM | POA: Diagnosis not present

## 2017-11-27 DIAGNOSIS — E1151 Type 2 diabetes mellitus with diabetic peripheral angiopathy without gangrene: Secondary | ICD-10-CM | POA: Diagnosis not present

## 2017-11-27 DIAGNOSIS — Z6832 Body mass index (BMI) 32.0-32.9, adult: Secondary | ICD-10-CM | POA: Diagnosis not present

## 2017-11-27 DIAGNOSIS — I10 Essential (primary) hypertension: Secondary | ICD-10-CM | POA: Diagnosis not present

## 2017-11-27 DIAGNOSIS — I7389 Other specified peripheral vascular diseases: Secondary | ICD-10-CM | POA: Diagnosis not present

## 2017-11-27 DIAGNOSIS — E782 Mixed hyperlipidemia: Secondary | ICD-10-CM | POA: Diagnosis not present

## 2017-11-27 DIAGNOSIS — F411 Generalized anxiety disorder: Secondary | ICD-10-CM | POA: Diagnosis not present

## 2017-12-13 DIAGNOSIS — H25811 Combined forms of age-related cataract, right eye: Secondary | ICD-10-CM | POA: Diagnosis not present

## 2017-12-13 DIAGNOSIS — Z01818 Encounter for other preprocedural examination: Secondary | ICD-10-CM | POA: Diagnosis not present

## 2017-12-15 DIAGNOSIS — H25811 Combined forms of age-related cataract, right eye: Secondary | ICD-10-CM | POA: Diagnosis not present

## 2017-12-15 DIAGNOSIS — H2511 Age-related nuclear cataract, right eye: Secondary | ICD-10-CM | POA: Diagnosis not present

## 2017-12-27 ENCOUNTER — Ambulatory Visit (INDEPENDENT_AMBULATORY_CARE_PROVIDER_SITE_OTHER): Payer: PPO | Admitting: Vascular Surgery

## 2017-12-27 ENCOUNTER — Ambulatory Visit (HOSPITAL_COMMUNITY)
Admission: RE | Admit: 2017-12-27 | Discharge: 2017-12-27 | Disposition: A | Discharge status: Home / Self Care | Payer: PPO | Source: Ambulatory Visit | Attending: Vascular Surgery | Admitting: Vascular Surgery

## 2017-12-27 ENCOUNTER — Encounter: Payer: Self-pay | Admitting: Vascular Surgery

## 2017-12-27 ENCOUNTER — Other Ambulatory Visit: Payer: Self-pay

## 2017-12-27 VITALS — BP 163/75 | HR 65 | Temp 97.1°F | Resp 14 | Ht 65.0 in | Wt 178.0 lb

## 2017-12-27 DIAGNOSIS — I739 Peripheral vascular disease, unspecified: Secondary | ICD-10-CM | POA: Insufficient documentation

## 2017-12-27 NOTE — Progress Notes (Signed)
Vitals:   12/27/17 1447 12/27/17 1452  BP: (!) 157/75 (!) 142/65  Pulse: 65 65  Resp: 14   Temp: (!) 97.1 F (36.2 C)   TempSrc: Oral   SpO2: 96%   Weight: 178 lb (80.7 kg)   Height: 5\' 5"  (1.651 m)

## 2017-12-27 NOTE — Progress Notes (Signed)
Patient name: Lauren Craig MRN: [id number] DOB: Jul 13, 1945 Sex: female  REASON FOR VISIT:   Follow-up of peripheral vascular disease.  HPI:   Lauren Craig is a pleasant 72 y.o. female who had presented with disabling claudication of the left lower extremity.  She underwent an arteriogram in March of this year which showed on the left side the common iliac artery was occluded in its origin with reconstitution of the distal common iliac artery.  The external iliac artery was patent.  There was eccentric calcific plaque in the left common femoral artery.  The posterior tibial artery was occluded.  Given the patient's age and comorbidities including obesity and diabetes I favor a conservative approach.  If she did require revascularization I felt that it would be reasonable to consider a left femoral endarterectomy with bilateral kissing balloon stents if I was able to successfully cross the left common iliac artery occlusion.  Otherwise she might be a candidate for axillobifemoral bypass.  My main concern was her risk of an groin infections given her obesity and diabetes.  She comes in for 56-month follow-up visit.  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  Her symptoms have continued to gradually improve.  She is also become very interested in nutrition and is eating a largely plant-based diet with excellent results.  She has quit smoking.  Her claudication symptoms have improved.  She still experiences some pain in both legs which is brought on by ambulation relieved with rest however she can walk farther now.  Her symptoms are more significant on the left side.  She denies any history of rest pain.  Past Medical History:  Diagnosis Date  . Essential (primary) hypertension 1990  . Mixed hyperlipidemia 2010  . 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 Diabetes Mellitus II Mother   . Hypertension Father   . Heart Problems Brother        pacemaker  . Diabetes Mellitus II Brother   . Bipolar disorder Brother     SOCIAL HISTORY: Social History   Tobacco Use  . Smoking status: Former Smoker    Types: Cigarettes    Last attempt to quit: 2004    Years since quitting: 15.5  . Smokeless tobacco: Never Used  Substance Use Topics  . Alcohol use: No    Frequency: Never    Allergies  Allergen Reactions  . Penicillins Rash and Other (See Comments)    Has patient had a PCN reaction causing immediate rash, facial/tongue/throat swelling, SOB or lightheadedness with hypotension: Yes Has patient had a PCN reaction causing severe rash involving mucus membranes or skin necrosis: No Has patient had a PCN reaction that required hospitalization: Yes - MD office Has patient had a PCN reaction occurring within the last 10 years: No If all of the above answers are "NO", then may proceed with Cephalosporin use.  Other reaction(s): Other (See Comments), Rash Has patient had a PCN reaction causing immediate rash, facial/tongue/throat swelling, SOB or lightheadedness with hypotension: Yes Has patient had a PCN reaction causing severe rash involving mucus membranes or skin necrosis: No Has patient had a PCN reaction that required hospitalization: Yes - MD office Has patient had a PCN reaction occurring within the last 10 years:  No If all of the above answers are "NO", then may proceed with Cephalosporin use.  . Humalog [Insulin Lispro] Itching and Other (See Comments)    Site of injection was inflamed, red, painful episode    Current Outpatient Medications  Medication Sig Dispense Refill  . albuterol (PROVENTIL HFA;VENTOLIN HFA) 108 (90 Base) MCG/ACT inhaler Inhale 2 puffs into the lungs every 6 (six) hours as needed for wheezing or shortness of breath.    Marland Kitchen aspirin 325 MG EC tablet Take 325 mg  by mouth 2 (two) times daily.     Marland Kitchen atorvastatin (LIPITOR) 20 MG tablet Take 20 mg by mouth at bedtime.     . cyclobenzaprine (FLEXERIL) 5 MG tablet Take 5 mg by mouth 3 (three) times daily as needed for muscle spasms.    . enalapril (VASOTEC) 5 MG tablet Take 2.5 mg by mouth daily.    . fluticasone (FLONASE) 50 MCG/ACT nasal spray Place 1 spray into both nostrils daily as needed for allergies or rhinitis.    Marland Kitchen gabapentin (NEURONTIN) 100 MG capsule Take 100 mg by mouth 2 (two) times daily.    . insulin aspart (NOVOLOG) 100 UNIT/ML FlexPen Inject 25 Units into the skin 2 (two) times daily before a meal.     . Insulin Glargine (LANTUS SOLOSTAR) 100 UNIT/ML Solostar Pen Inject 50 Units into the skin at bedtime.     . triamcinolone cream (KENALOG) 0.1 % Apply 1 application topically daily as needed (for rash).      No current facility-administered medications for this visit.     REVIEW OF SYSTEMS:  [X]  denotes positive finding, [ ]  denotes negative finding Cardiac  Comments:  Chest pain or chest pressure:    Shortness of breath upon exertion:    Short of breath when lying flat:    Irregular heart rhythm:        Vascular    Pain in calf, thigh, or hip brought on by ambulation: x   Pain in feet at night that wakes you up from your sleep:     Blood clot in your veins:    Leg swelling:         Pulmonary    Oxygen at home:    Productive cough:     Wheezing:         Neurologic    Sudden weakness in arms or legs:     Sudden numbness in arms or legs:     Sudden onset of difficulty speaking or slurred speech:    Temporary loss of vision in one eye:     Problems with dizziness:         Gastrointestinal    Blood in stool:     Vomited blood:         Genitourinary    Burning when urinating:     Blood in urine:        Psychiatric    Major depression:         Hematologic    Bleeding problems:    Problems with blood clotting too easily:        Skin    Rashes or ulcers:         Constitutional    Fever or chills:     PHYSICAL EXAM:   Vitals:   12/27/17 1447 12/27/17 1452 12/27/17 1453  BP: (!) 157/75 (!) 142/65 (!) 163/75  Pulse: 65 65 65  Resp: 14    Temp: (!) 97.1 F (36.2 C)  TempSrc: Oral    SpO2: 96%    Weight: 178 lb (80.7 kg)    Height: 5\' 5"  (1.651 m)      GENERAL: The patient is a well-nourished female, in no acute distress. The vital signs are documented above. CARDIAC: There is a regular rate and rhythm.  VASCULAR: I do not detect carotid bruits. On the right side she has a slightly diminished but palpable right femoral pulse.  She has a palpable right dorsalis pedis pulse. On the left side cannot palpate a femoral pulse or pedal pulses. Both feet are adequately perfused. She has no significant lower extremity swelling. PULMONARY: There is good air exchange bilaterally without wheezing or rales. ABDOMEN: Soft and non-tender with normal pitched bowel sounds.  MUSCULOSKELETAL: There are no major deformities or cyanosis. NEUROLOGIC: No focal weakness or paresthesias are detected. SKIN: There are no ulcers or rashes noted. PSYCHIATRIC: The patient has a normal affect.  DATA:    ARTERIAL DOPPLER STUDY: I have independently interpreted her arterial Doppler study today.  On the left side there is a dorsalis pedis signal with a Doppler which is monophasic.  The ABI is 43% with a toe pressure of 75 mmHg.  On the right side there is a monophasic dorsalis pedis signal.  ABI is 73% with a toe pressure of 95 mmHg.  MEDICAL ISSUES:   MULTILEVEL ARTERIAL OCCLUSIVE DISEASE: This patient has multilevel arterial occlusive disease and I have been concerned about our surgical options given her risk for infection.  Fortunately, she has made excellent progress with conservative measures including a largely plant-based diet and lots of walking.  Her symptoms have improved significantly.  I have encouraged her to continue on this journey and I suspect she  will continue to improve and will not require surgery.  I plan on seeing her back in 6 months.  I have ordered follow-up ABIs at that time and also she is very concerned about the potential for carotid disease so I have ordered a carotid duplex scan.  She is on aspirin and is on a statin.  [name] [facility] [phone]

## 2018-01-15 ENCOUNTER — Other Ambulatory Visit: Payer: Self-pay

## 2018-01-15 DIAGNOSIS — I739 Peripheral vascular disease, unspecified: Secondary | ICD-10-CM

## 2018-01-15 DIAGNOSIS — I6529 Occlusion and stenosis of unspecified carotid artery: Secondary | ICD-10-CM

## 2018-01-23 DIAGNOSIS — H02834 Dermatochalasis of left upper eyelid: Secondary | ICD-10-CM | POA: Diagnosis not present

## 2018-01-23 DIAGNOSIS — H02831 Dermatochalasis of right upper eyelid: Secondary | ICD-10-CM | POA: Diagnosis not present

## 2018-02-01 DIAGNOSIS — E1165 Type 2 diabetes mellitus with hyperglycemia: Secondary | ICD-10-CM | POA: Diagnosis not present

## 2018-02-01 DIAGNOSIS — Z833 Family history of diabetes mellitus: Secondary | ICD-10-CM | POA: Diagnosis not present

## 2018-02-01 DIAGNOSIS — E785 Hyperlipidemia, unspecified: Secondary | ICD-10-CM | POA: Diagnosis not present

## 2018-02-01 DIAGNOSIS — Z794 Long term (current) use of insulin: Secondary | ICD-10-CM | POA: Diagnosis not present

## 2018-02-01 DIAGNOSIS — I10 Essential (primary) hypertension: Secondary | ICD-10-CM | POA: Diagnosis not present

## 2018-03-05 DIAGNOSIS — I739 Peripheral vascular disease, unspecified: Secondary | ICD-10-CM | POA: Diagnosis not present

## 2018-03-05 DIAGNOSIS — B379 Candidiasis, unspecified: Secondary | ICD-10-CM | POA: Diagnosis not present

## 2018-03-05 DIAGNOSIS — Z79899 Other long term (current) drug therapy: Secondary | ICD-10-CM | POA: Diagnosis not present

## 2018-03-05 DIAGNOSIS — R5383 Other fatigue: Secondary | ICD-10-CM | POA: Diagnosis not present

## 2018-03-05 DIAGNOSIS — Z0000 Encounter for general adult medical examination without abnormal findings: Secondary | ICD-10-CM | POA: Diagnosis not present

## 2018-03-05 DIAGNOSIS — E1165 Type 2 diabetes mellitus with hyperglycemia: Secondary | ICD-10-CM | POA: Diagnosis not present

## 2018-03-05 DIAGNOSIS — Z6832 Body mass index (BMI) 32.0-32.9, adult: Secondary | ICD-10-CM | POA: Diagnosis not present

## 2018-03-05 DIAGNOSIS — Z1339 Encounter for screening examination for other mental health and behavioral disorders: Secondary | ICD-10-CM | POA: Diagnosis not present

## 2018-03-05 DIAGNOSIS — Z1331 Encounter for screening for depression: Secondary | ICD-10-CM | POA: Diagnosis not present

## 2018-03-05 DIAGNOSIS — Z23 Encounter for immunization: Secondary | ICD-10-CM | POA: Diagnosis not present

## 2018-03-05 DIAGNOSIS — I10 Essential (primary) hypertension: Secondary | ICD-10-CM | POA: Diagnosis not present

## 2018-03-05 DIAGNOSIS — E785 Hyperlipidemia, unspecified: Secondary | ICD-10-CM | POA: Diagnosis not present

## 2018-03-15 DIAGNOSIS — Z6832 Body mass index (BMI) 32.0-32.9, adult: Secondary | ICD-10-CM | POA: Diagnosis not present

## 2018-03-15 DIAGNOSIS — E7800 Pure hypercholesterolemia, unspecified: Secondary | ICD-10-CM | POA: Diagnosis not present

## 2018-03-15 DIAGNOSIS — E1165 Type 2 diabetes mellitus with hyperglycemia: Secondary | ICD-10-CM | POA: Diagnosis not present

## 2018-03-15 DIAGNOSIS — D510 Vitamin B12 deficiency anemia due to intrinsic factor deficiency: Secondary | ICD-10-CM | POA: Diagnosis not present

## 2018-03-15 DIAGNOSIS — Z780 Asymptomatic menopausal state: Secondary | ICD-10-CM | POA: Diagnosis not present

## 2018-03-15 DIAGNOSIS — I10 Essential (primary) hypertension: Secondary | ICD-10-CM | POA: Diagnosis not present

## 2018-03-20 DIAGNOSIS — E1165 Type 2 diabetes mellitus with hyperglycemia: Secondary | ICD-10-CM | POA: Diagnosis not present

## 2018-03-28 DIAGNOSIS — Z6832 Body mass index (BMI) 32.0-32.9, adult: Secondary | ICD-10-CM | POA: Diagnosis not present

## 2018-03-28 DIAGNOSIS — E1165 Type 2 diabetes mellitus with hyperglycemia: Secondary | ICD-10-CM | POA: Diagnosis not present

## 2018-04-04 DIAGNOSIS — L97509 Non-pressure chronic ulcer of other part of unspecified foot with unspecified severity: Secondary | ICD-10-CM | POA: Insufficient documentation

## 2018-04-04 DIAGNOSIS — M79672 Pain in left foot: Secondary | ICD-10-CM | POA: Diagnosis not present

## 2018-04-04 DIAGNOSIS — E11621 Type 2 diabetes mellitus with foot ulcer: Secondary | ICD-10-CM | POA: Diagnosis not present

## 2018-04-04 DIAGNOSIS — M86172 Other acute osteomyelitis, left ankle and foot: Secondary | ICD-10-CM | POA: Diagnosis not present

## 2018-04-04 DIAGNOSIS — M8610 Other acute osteomyelitis, unspecified site: Secondary | ICD-10-CM | POA: Insufficient documentation

## 2018-04-05 DIAGNOSIS — D510 Vitamin B12 deficiency anemia due to intrinsic factor deficiency: Secondary | ICD-10-CM | POA: Diagnosis not present

## 2018-04-12 ENCOUNTER — Other Ambulatory Visit (HOSPITAL_COMMUNITY): Payer: Self-pay | Admitting: Orthopedic Surgery

## 2018-04-12 DIAGNOSIS — D510 Vitamin B12 deficiency anemia due to intrinsic factor deficiency: Secondary | ICD-10-CM | POA: Diagnosis not present

## 2018-04-20 DIAGNOSIS — D510 Vitamin B12 deficiency anemia due to intrinsic factor deficiency: Secondary | ICD-10-CM | POA: Diagnosis not present

## 2018-04-28 DIAGNOSIS — D510 Vitamin B12 deficiency anemia due to intrinsic factor deficiency: Secondary | ICD-10-CM | POA: Diagnosis not present

## 2018-04-28 DIAGNOSIS — E1165 Type 2 diabetes mellitus with hyperglycemia: Secondary | ICD-10-CM | POA: Diagnosis not present

## 2018-04-28 DIAGNOSIS — E785 Hyperlipidemia, unspecified: Secondary | ICD-10-CM | POA: Diagnosis not present

## 2018-04-28 DIAGNOSIS — I10 Essential (primary) hypertension: Secondary | ICD-10-CM | POA: Diagnosis not present

## 2018-05-01 DIAGNOSIS — D510 Vitamin B12 deficiency anemia due to intrinsic factor deficiency: Secondary | ICD-10-CM | POA: Diagnosis not present

## 2018-05-09 ENCOUNTER — Encounter (HOSPITAL_BASED_OUTPATIENT_CLINIC_OR_DEPARTMENT_OTHER): Payer: Self-pay | Admitting: *Deleted

## 2018-05-09 ENCOUNTER — Other Ambulatory Visit: Payer: Self-pay

## 2018-05-15 DIAGNOSIS — E7800 Pure hypercholesterolemia, unspecified: Secondary | ICD-10-CM | POA: Diagnosis not present

## 2018-05-15 DIAGNOSIS — E1165 Type 2 diabetes mellitus with hyperglycemia: Secondary | ICD-10-CM | POA: Diagnosis not present

## 2018-05-15 DIAGNOSIS — Z6831 Body mass index (BMI) 31.0-31.9, adult: Secondary | ICD-10-CM | POA: Diagnosis not present

## 2018-05-15 DIAGNOSIS — I10 Essential (primary) hypertension: Secondary | ICD-10-CM | POA: Diagnosis not present

## 2018-05-15 DIAGNOSIS — M810 Age-related osteoporosis without current pathological fracture: Secondary | ICD-10-CM | POA: Diagnosis not present

## 2018-05-17 ENCOUNTER — Encounter (HOSPITAL_BASED_OUTPATIENT_CLINIC_OR_DEPARTMENT_OTHER)
Admission: RE | Disposition: A | Discharge status: Home / Self Care | Payer: Self-pay | Source: Home / Self Care | Attending: Orthopedic Surgery

## 2018-05-17 ENCOUNTER — Encounter (HOSPITAL_BASED_OUTPATIENT_CLINIC_OR_DEPARTMENT_OTHER): Payer: Self-pay | Admitting: Anesthesiology

## 2018-05-17 ENCOUNTER — Ambulatory Visit (HOSPITAL_BASED_OUTPATIENT_CLINIC_OR_DEPARTMENT_OTHER)
Admission: RE | Admit: 2018-05-17 | Discharge: 2018-05-17 | Disposition: A | Discharge status: Home / Self Care | Payer: PPO | Attending: Orthopedic Surgery | Admitting: Orthopedic Surgery

## 2018-05-17 ENCOUNTER — Ambulatory Visit (HOSPITAL_BASED_OUTPATIENT_CLINIC_OR_DEPARTMENT_OTHER): Payer: PPO | Admitting: Anesthesiology

## 2018-05-17 DIAGNOSIS — E1136 Type 2 diabetes mellitus with diabetic cataract: Secondary | ICD-10-CM | POA: Diagnosis not present

## 2018-05-17 DIAGNOSIS — Z79899 Other long term (current) drug therapy: Secondary | ICD-10-CM | POA: Diagnosis not present

## 2018-05-17 DIAGNOSIS — L97529 Non-pressure chronic ulcer of other part of left foot with unspecified severity: Secondary | ICD-10-CM | POA: Insufficient documentation

## 2018-05-17 DIAGNOSIS — M1990 Unspecified osteoarthritis, unspecified site: Secondary | ICD-10-CM | POA: Diagnosis not present

## 2018-05-17 DIAGNOSIS — E1169 Type 2 diabetes mellitus with other specified complication: Secondary | ICD-10-CM | POA: Insufficient documentation

## 2018-05-17 DIAGNOSIS — Z87891 Personal history of nicotine dependence: Secondary | ICD-10-CM | POA: Insufficient documentation

## 2018-05-17 DIAGNOSIS — E11621 Type 2 diabetes mellitus with foot ulcer: Secondary | ICD-10-CM | POA: Diagnosis not present

## 2018-05-17 DIAGNOSIS — Z794 Long term (current) use of insulin: Secondary | ICD-10-CM | POA: Diagnosis not present

## 2018-05-17 DIAGNOSIS — E13621 Other specified diabetes mellitus with foot ulcer: Secondary | ICD-10-CM | POA: Diagnosis not present

## 2018-05-17 DIAGNOSIS — M868X7 Other osteomyelitis, ankle and foot: Secondary | ICD-10-CM | POA: Insufficient documentation

## 2018-05-17 DIAGNOSIS — M869 Osteomyelitis, unspecified: Secondary | ICD-10-CM | POA: Diagnosis not present

## 2018-05-17 HISTORY — PX: AMPUTATION TOE: SHX6595

## 2018-05-17 HISTORY — DX: Unspecified osteoarthritis, unspecified site: M19.90

## 2018-05-17 HISTORY — DX: Osteomyelitis, unspecified: M86.9

## 2018-05-17 HISTORY — DX: Other seasonal allergic rhinitis: J30.2

## 2018-05-17 LAB — GLUCOSE, CAPILLARY: Glucose-Capillary: 145 mg/dL — ABNORMAL HIGH (ref 70–99)

## 2018-05-17 SURGERY — AMPUTATION, TOE
Anesthesia: Monitor Anesthesia Care | Site: Foot | Laterality: Left

## 2018-05-17 MED ORDER — PROPOFOL 500 MG/50ML IV EMUL
INTRAVENOUS | Status: DC | PRN
  Administered 2018-05-17: 75 ug/kg/min via INTRAVENOUS

## 2018-05-17 MED ORDER — SCOPOLAMINE 1 MG/3DAYS TD PT72: 1.0000 | MEDICATED_PATCH | Freq: Once | TRANSDERMAL | Status: DC | PRN

## 2018-05-17 MED ORDER — MIDAZOLAM HCL 2 MG/2ML IJ SOLN
INTRAMUSCULAR | Status: AC
  Filled 2018-05-17: qty 2

## 2018-05-17 MED ORDER — CHLORHEXIDINE GLUCONATE 4 % EX LIQD: 60.0000 mL | Freq: Once | CUTANEOUS | Status: DC

## 2018-05-17 MED ORDER — MIDAZOLAM HCL 2 MG/2ML IJ SOLN
1.0000 mg | INTRAMUSCULAR | Status: DC | PRN
  Administered 2018-05-17: 1 mg via INTRAVENOUS

## 2018-05-17 MED ORDER — CEFAZOLIN SODIUM-DEXTROSE 2-4 GM/100ML-% IV SOLN
2.0000 g | INTRAVENOUS | Status: AC
  Administered 2018-05-17: 2 g via INTRAVENOUS

## 2018-05-17 MED ORDER — FENTANYL CITRATE (PF) 100 MCG/2ML IJ SOLN
50.0000 ug | INTRAMUSCULAR | Status: DC | PRN
  Administered 2018-05-17: 50 ug via INTRAVENOUS

## 2018-05-17 MED ORDER — SODIUM CHLORIDE 0.9 % IV SOLN: INTRAVENOUS | Status: DC

## 2018-05-17 MED ORDER — CEFAZOLIN SODIUM-DEXTROSE 2-4 GM/100ML-% IV SOLN
INTRAVENOUS | Status: AC
  Filled 2018-05-17: qty 100

## 2018-05-17 MED ORDER — LACTATED RINGERS IV SOLN
INTRAVENOUS | Status: DC
  Administered 2018-05-17: 13:00:00 via INTRAVENOUS

## 2018-05-17 MED ORDER — BUPIVACAINE-EPINEPHRINE (PF) 0.5% -1:200000 IJ SOLN
INTRAMUSCULAR | Status: DC | PRN
  Administered 2018-05-17: 25 mL via PERINEURAL

## 2018-05-17 MED ORDER — TRAMADOL HCL 50 MG PO TABS: 50.0000 mg | ORAL_TABLET | Freq: Four times a day (QID) | ORAL | 0 refills | Status: AC | PRN

## 2018-05-17 MED ORDER — FENTANYL CITRATE (PF) 100 MCG/2ML IJ SOLN
INTRAMUSCULAR | Status: AC
  Filled 2018-05-17: qty 2

## 2018-05-17 MED ORDER — 0.9 % SODIUM CHLORIDE (POUR BTL) OPTIME
TOPICAL | Status: DC | PRN
  Administered 2018-05-17: 200 mL

## 2018-05-17 MED ORDER — FENTANYL CITRATE (PF) 100 MCG/2ML IJ SOLN: 25.0000 ug | INTRAMUSCULAR | Status: DC | PRN

## 2018-05-17 SURGICAL SUPPLY — 64 items
BLADE AVERAGE 25MMX9MM (BLADE)
BLADE AVERAGE 25X9 (BLADE) IMPLANT
BLADE OSC/SAG .038X5.5 CUT EDG (BLADE) IMPLANT
BLADE SURG 10 STRL SS (BLADE) ×3 IMPLANT
BLADE SURG 15 STRL LF DISP TIS (BLADE) ×2 IMPLANT
BLADE SURG 15 STRL SS (BLADE) ×4
BNDG COHESIVE 4X5 TAN STRL (GAUZE/BANDAGES/DRESSINGS) ×3 IMPLANT
BNDG CONFORM 3 STRL LF (GAUZE/BANDAGES/DRESSINGS) IMPLANT
BNDG ESMARK 4X9 LF (GAUZE/BANDAGES/DRESSINGS) ×3 IMPLANT
CHLORAPREP W/TINT 26ML (MISCELLANEOUS) ×3 IMPLANT
COVER BACK TABLE 60X90IN (DRAPES) ×3 IMPLANT
COVER WAND RF STERILE (DRAPES) IMPLANT
CUFF TOURNIQUET SINGLE 24IN (TOURNIQUET CUFF) IMPLANT
DECANTER SPIKE VIAL GLASS SM (MISCELLANEOUS) IMPLANT
DRAPE EXTREMITY T 121X128X90 (DRAPE) ×3 IMPLANT
DRAPE SURG 17X23 STRL (DRAPES) ×3 IMPLANT
DRAPE U-SHAPE 47X51 STRL (DRAPES) ×3 IMPLANT
DRSG EMULSION OIL 3X3 NADH (GAUZE/BANDAGES/DRESSINGS) IMPLANT
DRSG MEPITEL 4X7.2 (GAUZE/BANDAGES/DRESSINGS) ×3 IMPLANT
DRSG PAD ABDOMINAL 8X10 ST (GAUZE/BANDAGES/DRESSINGS) IMPLANT
ELECT REM PT RETURN 9FT ADLT (ELECTROSURGICAL) ×3
ELECTRODE REM PT RTRN 9FT ADLT (ELECTROSURGICAL) ×1 IMPLANT
GAUZE SPONGE 4X4 12PLY STRL (GAUZE/BANDAGES/DRESSINGS) ×3 IMPLANT
GLOVE BIO SURGEON STRL SZ8 (GLOVE) ×3 IMPLANT
GLOVE BIOGEL PI IND STRL 7.0 (GLOVE) ×1 IMPLANT
GLOVE BIOGEL PI IND STRL 8 (GLOVE) ×2 IMPLANT
GLOVE BIOGEL PI INDICATOR 7.0 (GLOVE) ×2
GLOVE BIOGEL PI INDICATOR 8 (GLOVE) ×4
GLOVE ECLIPSE 8.0 STRL XLNG CF (GLOVE) ×3 IMPLANT
GLOVE SURG SYN 7.5  E (GLOVE) ×2
GLOVE SURG SYN 7.5 E (GLOVE) ×1 IMPLANT
GOWN STRL REIN XL XLG (GOWN DISPOSABLE) ×3 IMPLANT
GOWN STRL REUS W/ TWL LRG LVL3 (GOWN DISPOSABLE) IMPLANT
GOWN STRL REUS W/ TWL XL LVL3 (GOWN DISPOSABLE) ×1 IMPLANT
GOWN STRL REUS W/TWL LRG LVL3 (GOWN DISPOSABLE)
GOWN STRL REUS W/TWL XL LVL3 (GOWN DISPOSABLE) ×2
NDL SAFETY ECLIPSE 18X1.5 (NEEDLE) IMPLANT
NEEDLE HYPO 18GX1.5 SHARP (NEEDLE)
NEEDLE HYPO 25X1 1.5 SAFETY (NEEDLE) IMPLANT
NS IRRIG 1000ML POUR BTL (IV SOLUTION) ×3 IMPLANT
PACK BASIN DAY SURGERY FS (CUSTOM PROCEDURE TRAY) ×3 IMPLANT
PAD CAST 4YDX4 CTTN HI CHSV (CAST SUPPLIES) ×1 IMPLANT
PADDING CAST COTTON 4X4 STRL (CAST SUPPLIES) ×2
PENCIL BUTTON HOLSTER BLD 10FT (ELECTRODE) ×3 IMPLANT
SANITIZER HAND PURELL 535ML FO (MISCELLANEOUS) ×3 IMPLANT
SHEET MEDIUM DRAPE 40X70 STRL (DRAPES) ×6 IMPLANT
SLEEVE SCD COMPRESS KNEE MED (MISCELLANEOUS) ×3 IMPLANT
SPONGE LAP 18X18 RF (DISPOSABLE) ×3 IMPLANT
STOCKINETTE 6  STRL (DRAPES) ×2
STOCKINETTE 6 STRL (DRAPES) ×1 IMPLANT
SUCTION FRAZIER HANDLE 10FR (MISCELLANEOUS)
SUCTION TUBE FRAZIER 10FR DISP (MISCELLANEOUS) IMPLANT
SUT ETHILON 2 0 FS 18 (SUTURE) ×3 IMPLANT
SUT ETHILON 2 0 FSLX (SUTURE) IMPLANT
SUT ETHILON 3 0 PS 1 (SUTURE) IMPLANT
SUT MNCRL AB 3-0 PS2 18 (SUTURE) IMPLANT
SWAB COLLECTION DEVICE MRSA (MISCELLANEOUS) IMPLANT
SWAB CULTURE ESWAB REG 1ML (MISCELLANEOUS) IMPLANT
SYR BULB 3OZ (MISCELLANEOUS) ×3 IMPLANT
SYR CONTROL 10ML LL (SYRINGE) IMPLANT
TOWEL GREEN STERILE FF (TOWEL DISPOSABLE) ×3 IMPLANT
TUBE CONNECTING 20'X1/4 (TUBING)
TUBE CONNECTING 20X1/4 (TUBING) IMPLANT
UNDERPAD 30X30 (UNDERPADS AND DIAPERS) ×3 IMPLANT

## 2018-05-17 NOTE — Anesthesia Procedure Notes (Signed)
Performed by: Denette Hass D, CRNA       

## 2018-05-17 NOTE — Anesthesia Postprocedure Evaluation (Signed)
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)     Patient location during evaluation: PACU Anesthesia Type: Regional Level of consciousness: awake Pain management: pain level controlled Vital Signs Assessment: post-procedure vital signs reviewed and stable Respiratory status: spontaneous breathing and respiratory function stable Cardiovascular status: stable Postop Assessment: no apparent nausea or vomiting Anesthetic complications: no    Last Vitals:  Vitals:   05/17/18 1415 05/17/18 1430  BP: (!) 142/58 (!) 142/61  Pulse: 86 84  Resp: 17 16  Temp:    SpO2: 100% 100%    Last Pain:  Vitals:   05/17/18 1430  TempSrc:   PainSc: 0-No pain                 Gaspard Isbell DANIEL

## 2018-05-17 NOTE — Anesthesia Preprocedure Evaluation (Signed)
Anesthesia Evaluation  Patient identified by MRN, date of birth, ID band Patient awake    Reviewed: Allergy & Precautions, NPO status , Patient's Chart, lab work & pertinent test results  Airway Mallampati: II  TM Distance: >3 FB Neck ROM: Full    Dental   Pulmonary former smoker,    breath sounds clear to auscultation       Cardiovascular hypertension, Pt. on medications  Rhythm:Regular Rate:Normal     Neuro/Psych negative neurological ROS     GI/Hepatic negative GI ROS, Neg liver ROS,   Endo/Other  diabetes, Type 2, Insulin Dependent  Renal/GU negative Renal ROS     Musculoskeletal  (+) Arthritis ,   Abdominal   Peds  Hematology negative hematology ROS (+)   Anesthesia Other Findings   Reproductive/Obstetrics                             Anesthesia Physical Anesthesia Plan  ASA: III  Anesthesia Plan: MAC and Regional   Post-op Pain Management:  Regional for Post-op pain   Induction: Intravenous  PONV Risk Score and Plan: 2 and Ondansetron, Propofol infusion and Treatment may vary due to age or medical condition  Airway Management Planned: Natural Airway and Simple Face Mask  Additional Equipment:   Intra-op Plan:   Post-operative Plan:   Informed Consent: I have reviewed the patients History and Physical, chart, labs and discussed the procedure including the risks, benefits and alternatives for the proposed anesthesia with the patient or authorized representative who has indicated his/her understanding and acceptance.     Plan Discussed with: CRNA  Anesthesia Plan Comments:         Anesthesia Quick Evaluation

## 2018-05-17 NOTE — H&P (Signed)
Lauren Craig is an 72 y.o. female.   Chief Complaint:  Left 5th toe diabetic ulcer HPI: The patient is a 72 year old female with a past medical history significant for diabetes.  She has 1/5 toe nonhealing ulcer and has developed osteomyelitis deep to that wound.  She has failed nonoperative treatment to date and presents today for amputation of the left fifth toe.  Past Medical History:  Diagnosis Date  . Arthritis    hands, hips  . Essential (primary) hypertension 1990  . Mixed hyperlipidemia 2010  . Osteomyelitis ([place])    left 5th toe  . Seasonal allergies   . 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 ABDOMINAL AORTOGRAM W/LOWER EXTREMITY N/A 08/11/2017   Procedure: ABDOMINAL AORTOGRAM W/LOWER EXTREMITY;  Surgeon: Angelia Mould, MD;  Location: [facility];  Service: Cardiovascular;  Laterality: N/A;  . PARTIAL HYMENECTOMY  1968  . TONSILLECTOMY AND ADENOIDECTOMY      Family History  Problem Relation Age of Onset  . Diabetes Mellitus II Mother   . Hypertension Father   . Heart Problems Brother        pacemaker  . Diabetes Mellitus II Brother   . Bipolar disorder Brother    Social History:  reports that she quit smoking about 15 years ago. Her smoking use included cigarettes. She has never used smokeless tobacco. She reports current alcohol use. She reports that she does not use drugs.  Allergies:  Allergies  Allergen Reactions  . Penicillins Rash and Other (See Comments)    Has patient had a PCN reaction causing immediate rash, facial/tongue/throat swelling, SOB or lightheadedness with hypotension: Yes Has patient had a PCN reaction causing severe rash involving mucus membranes or skin necrosis: No Has patient had a PCN reaction that required hospitalization: Yes - MD office Has patient had a PCN reaction occurring within the last 10 years: No If all of the above answers are "NO",  then may proceed with Cephalosporin use.  Other reaction(s): Other (See Comments), Rash Has patient had a PCN reaction causing immediate rash, facial/tongue/throat swelling, SOB or lightheadedness with hypotension: Yes Has patient had a PCN reaction causing severe rash involving mucus membranes or skin necrosis: No Has patient had a PCN reaction that required hospitalization: Yes - MD office Has patient had a PCN reaction occurring within the last 10 years: No If all of the above answers are "NO", then may proceed with Cephalosporin use.  . Humalog [Insulin Lispro] Itching and Other (See Comments)    Site of injection was inflamed, red, painful episode    Medications Prior to Admission  Medication Sig Dispense Refill  . atorvastatin (LIPITOR) 20 MG tablet Take 20 mg by mouth at bedtime.     . empagliflozin (JARDIANCE) 10 MG TABS tablet Take 10 mg by mouth daily.    . enalapril (VASOTEC) 5 MG tablet Take 2.5 mg by mouth daily.    Marland Kitchen gabapentin (NEURONTIN) 100 MG capsule Take 100 mg by mouth 2 (two) times daily.    . insulin aspart (NOVOLOG) 100 UNIT/ML FlexPen Inject 25 Units into the skin 2 (two) times daily before a meal.     . Insulin Glargine (LANTUS SOLOSTAR) 100 UNIT/ML Solostar Pen Inject 40 Units into the skin at bedtime.     . Semaglutide,0.25 or 0.5MG /DOS, (OZEMPIC, 0.25 OR 0.5 MG/DOSE,) 2 MG/1.5ML SOPN Inject into the skin.    Marland Kitchen albuterol (PROVENTIL HFA;VENTOLIN  HFA) 108 (90 Base) MCG/ACT inhaler Inhale 2 puffs into the lungs every 6 (six) hours as needed for wheezing or shortness of breath.      No results found for this or any previous visit (from the past 48 hour(s)). No results found.  ROS no recent fever, chills, nausea, vomiting or changes in her appetite  Blood pressure (!) 120/57, pulse 88, temperature 97.6 F (36.4 C), temperature source Oral, resp. rate (!) 4, height 5' 5.5" (1.664 m), weight 79 kg, SpO2 94 %. Physical Exam  Well-nourished well-developed woman in no  apparent distress.  Alert and oriented x4.  Mood and affect are normal.  Extraocular motions are intact.  Respirations are unlabored.  Gait is normal.  Left fifth toe has an ulcer laterally.  Skin is otherwise healthy and intact.  Brisk capillary refill at the toes.  Diminished sensibility to light touch at the forefoot.  5 out of 5 strength in plantar flexion and dorsiflexion of the toes.  Assessment/Plan Left fifth toe diabetic ulcer and osteomyelitis -to the operating room today for left fifth toe amputation.  The risks and benefits of the alternative treatment options have been discussed in detail.  The patient wishes to proceed with surgery and specifically understands risks of bleeding, infection, nerve damage, blood clots, need for additional surgery, amputation and death.   Wylene Simmer, MD 2018-05-24, 12:50 PM

## 2018-05-17 NOTE — Op Note (Signed)
05/17/2018  2:04 PM  PATIENT:  Lauren Craig  72 y.o. female  PRE-OPERATIVE DIAGNOSIS: Left fifth toe diabetic ulcer and osteomyelitis  POST-OPERATIVE DIAGNOSIS: Same  Procedure(s): Left fifth toe amputation through the MTP joint  SURGEON:  Wylene Simmer, MD  ASSISTANT: None  ANESTHESIA:   MAC, regional  EBL:  minimal   TOURNIQUET:   Total Tourniquet Time Documented: Leg (Left) - 9 minutes Total: Leg (Left) - 9 minutes  COMPLICATIONS:  None apparent  DISPOSITION:  Extubated, awake and stable to recovery.  INDICATION FOR PROCEDURE: The patient is a 72 year old female with a past medical history significant for diabetes.  She has a chronic ulcer at her left fifth toe that has not healed despite extensive wound care.  She also has underlying osteomyelitis.  She presents now for left fifth toe amputation.  The risks and benefits of the alternative treatment options have been discussed in detail.  The patient wishes to proceed with surgery and specifically understands risks of bleeding, infection, nerve damage, blood clots, need for additional surgery, amputation and death.  PROCEDURE IN DETAIL:  After pre operative consent was obtained, and the correct operative site was identified, the patient was brought to the operating room and placed supine on the OR table.  Anesthesia was administered.  Pre-operative antibiotics were administered.  A surgical timeout was taken.  The left lower extremity was prepped and draped in standard sterile fashion.  The foot was exsanguinated and a 4 inch Esmarch tourniquet wrapped around the ankle.  A racquet style incision was made around the base of the fifth toe.  Dissection was carried down through the subcutaneous tissues.  Subperiosteal dissection was then carried to the MTP joint.  The toe was disarticulated and passed off the field.  The wound was irrigated copiously.  Vascular bundles were cauterized.  The incision was closed with horizontal mattress  sutures of 2-0 nylon.  Sterile dressings were applied followed by a compression wrap.  The tourniquet was released after application of the dressings.  The patient was awakened from anesthesia and transported to the recovery room in stable condition.   FOLLOW UP PLAN: Weightbearing as tolerated in a flat postop shoe.  Follow-up in the office in 3 weeks for suture removal.

## 2018-05-17 NOTE — Transfer of Care (Signed)
Immediate Anesthesia Transfer of Care Note  Patient: Lauren Craig  Procedure(s) Performed: 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 (Left Foot)  Patient Location: PACU  Anesthesia Type:MAC combined with regional for post-op pain  Level of Consciousness: awake, alert , oriented and patient cooperative  Airway & Oxygen Therapy: Patient Spontanous Breathing and Patient connected to face mask oxygen  Post-op Assessment: Report given to RN and Post -op Vital signs reviewed and stable  Post vital signs: Reviewed and stable  Last Vitals:  Vitals Value Taken Time  BP    Temp    Pulse 89 05/17/2018  1:57 PM  Resp 18 05/17/2018  1:57 PM  SpO2 98 % 05/17/2018  1:57 PM  Vitals shown include unvalidated device data.  Last Pain:  Vitals:   05/17/18 1239  TempSrc: Oral  PainSc: 5          Complications: No apparent anesthesia complications

## 2018-05-17 NOTE — Progress Notes (Signed)
Assisted Dr. Rob Fitzgerald with left, ultrasound guided, popliteal block. Side rails up, monitors on throughout procedure. See vital signs in flow sheet. Tolerated Procedure well. 

## 2018-05-17 NOTE — Anesthesia Procedure Notes (Signed)
Procedure Name: MAC Date/Time: 05/17/2018 1:39 PM Performed by: Signe Colt, CRNA Pre-anesthesia Checklist: Patient identified, Emergency Drugs available, Suction available, Patient being monitored and Timeout performed Patient Re-evaluated:Patient Re-evaluated prior to induction Oxygen Delivery Method: Simple face mask

## 2018-05-17 NOTE — Discharge Instructions (Addendum)
Wylene Simmer, MD [place]  Please read the following information regarding your care after surgery.  Medications  You only need a prescription for the narcotic pain medicine (ex. oxycodone, Percocet, Norco).  All of the other medicines listed below are available over the counter. X Aleve 2 pills twice a day for the first 3 days after surgery. X acetominophen (Tylenol) 650 mg every 4-6 hours as you need for minor to moderate pain X tramadol as prescribed for severe pain  Weight Bearing ? Bear weight when you are able on your operated leg or foot. X Bear weight only on your operated foot in the post-op shoe. ? Do not bear any weight on the operated leg or foot.  Cast / Splint / Dressing X Keep your splint, cast or dressing clean and dry.  Dont put anything (coat hanger, pencil, etc) down inside of it.  If it gets damp, use a hair dryer on the cool setting to dry it.  If it gets soaked, call the office to schedule an appointment for a cast change. ? Remove your dressing 3 days after surgery and cover the incisions with dry dressings.    After your dressing, cast or splint is removed; you may shower, but do not soak or scrub the wound.  Allow the water to run over it, and then gently pat it dry.  Swelling It is normal for you to have swelling where you had surgery.  To reduce swelling and pain, keep your toes above your nose for at least 3 days after surgery.  It may be necessary to keep your foot or leg elevated for several weeks.  If it hurts, it should be elevated.  Follow Up Call my office at [phone] when you are discharged from the hospital or surgery center to schedule an appointment to be seen two weeks after surgery.  Call my office at [phone] if you develop a fever >101.5 F, nausea, vomiting, bleeding from the surgical site or severe pain.     Regional Anesthesia Blocks  1. Numbness or the inability to move the "blocked" extremity may last from 3-48  hours after placement. The length of time depends on the medication injected and your individual response to the medication. If the numbness is not going away after 48 hours, call your surgeon.  2. The extremity that is blocked will need to be protected until the numbness is gone and the  Strength has returned. Because you cannot feel it, you will need to take extra care to avoid injury. Because it may be weak, you may have difficulty moving it or using it. You may not know what position it is in without looking at it while the block is in effect.  3. For blocks in the legs and feet, returning to weight bearing and walking needs to be done carefully. You will need to wait until the numbness is entirely gone and the strength has returned. You should be able to move your leg and foot normally before you try and bear weight or walk. You will need someone to be with you when you first try to ensure you do not fall and possibly risk injury.  4. Bruising and tenderness at the needle site are common side effects and will resolve in a few days.  5. Persistent numbness or new problems with movement should be communicated to the surgeon or the [facility] [phone] [place] [phone]).   Post Anesthesia Home Care Instructions  Activity: Get plenty of rest for the remainder of the day. A responsible individual must stay with you for 24 hours following the procedure.  For the next 24 hours, DO NOT: -Drive a car -Paediatric nurse -Drink alcoholic beverages -Take any medication unless instructed by your physician -Make any legal decisions or sign important papers.  Meals: Start with liquid foods such as gelatin or soup. Progress to regular foods as tolerated. Avoid greasy, spicy, heavy foods. If nausea and/or vomiting occur, drink only clear liquids until the nausea and/or vomiting subsides. Call your physician if vomiting continues.  Special  Instructions/Symptoms: Your throat may feel dry or sore from the anesthesia or the breathing tube placed in your throat during surgery. If this causes discomfort, gargle with warm salt water. The discomfort should disappear within 24 hours.  If you had a scopolamine patch placed behind your ear for the management of post- operative nausea and/or vomiting:  1. The medication in the patch is effective for 72 hours, after which it should be removed.  Wrap patch in a tissue and discard in the trash. Wash hands thoroughly with soap and water. 2. You may remove the patch earlier than 72 hours if you experience unpleasant side effects which may include dry mouth, dizziness or visual disturbances. 3. Avoid touching the patch. Wash your hands with soap and water after contact with the patch.

## 2018-05-17 NOTE — Anesthesia Procedure Notes (Signed)
Anesthesia Regional Block: Popliteal block   Pre-Anesthetic Checklist: ,, timeout performed, Correct Patient, Correct Site, Correct Laterality, Correct Procedure, Correct Position, site marked, Risks and benefits discussed,  Surgical consent,  Pre-op evaluation,  At surgeon's request and post-op pain management  Laterality: Left  Prep: chloraprep       Needles:  Injection technique: Single-shot  Needle Type: Echogenic Needle     Needle Length: 9cm  Needle Gauge: 21     Additional Needles:   Procedures:,,,, ultrasound used (permanent image in chart),,,,  Narrative:  Start time: 05/17/2018 12:20 PM End time: 05/17/2018 12:26 PM Injection made incrementally with aspirations every 5 mL.  Performed by: Personally  Anesthesiologist: Suzette Battiest, MD

## 2018-05-18 ENCOUNTER — Encounter (HOSPITAL_BASED_OUTPATIENT_CLINIC_OR_DEPARTMENT_OTHER): Payer: Self-pay | Admitting: Orthopedic Surgery

## 2018-05-18 NOTE — Addendum Note (Signed)
Addendum  created 05/18/18 1712 by Taher Vannote, Ernesta Amble, CRNA   Charge Capture section accepted

## 2018-05-29 DIAGNOSIS — E1165 Type 2 diabetes mellitus with hyperglycemia: Secondary | ICD-10-CM | POA: Diagnosis not present

## 2018-05-29 DIAGNOSIS — I739 Peripheral vascular disease, unspecified: Secondary | ICD-10-CM | POA: Diagnosis not present

## 2018-06-04 LAB — POCT I-STAT, CHEM 8
BUN: 21 mg/dL (ref 8–23)
Calcium, Ion: 1.11 mmol/L — ABNORMAL LOW (ref 1.15–1.40)
Chloride: 103 mmol/L (ref 98–111)
Creatinine, Ser: 0.9 mg/dL (ref 0.44–1.00)
Glucose, Bld: 177 mg/dL — ABNORMAL HIGH (ref 70–99)
HEMATOCRIT: 46 % (ref 36.0–46.0)
HEMOGLOBIN: 15.6 g/dL — AB (ref 12.0–15.0)
Potassium: 4.6 mmol/L (ref 3.5–5.1)
Sodium: 135 mmol/L (ref 135–145)
TCO2: 24 mmol/L (ref 22–32)

## 2018-06-27 ENCOUNTER — Encounter (HOSPITAL_COMMUNITY): Payer: PPO

## 2018-06-27 ENCOUNTER — Ambulatory Visit: Payer: PPO | Admitting: Vascular Surgery

## 2018-06-28 DIAGNOSIS — E1165 Type 2 diabetes mellitus with hyperglycemia: Secondary | ICD-10-CM | POA: Diagnosis not present

## 2018-06-28 DIAGNOSIS — I10 Essential (primary) hypertension: Secondary | ICD-10-CM | POA: Diagnosis not present

## 2018-06-28 DIAGNOSIS — D510 Vitamin B12 deficiency anemia due to intrinsic factor deficiency: Secondary | ICD-10-CM | POA: Diagnosis not present

## 2018-07-11 ENCOUNTER — Ambulatory Visit (INDEPENDENT_AMBULATORY_CARE_PROVIDER_SITE_OTHER): Payer: PPO | Admitting: Vascular Surgery

## 2018-07-11 ENCOUNTER — Encounter: Payer: Self-pay | Admitting: Vascular Surgery

## 2018-07-11 ENCOUNTER — Ambulatory Visit (HOSPITAL_COMMUNITY)
Admission: RE | Admit: 2018-07-11 | Discharge: 2018-07-11 | Disposition: A | Discharge status: Home / Self Care | Payer: PPO | Source: Ambulatory Visit | Attending: Vascular Surgery | Admitting: Vascular Surgery

## 2018-07-11 ENCOUNTER — Ambulatory Visit (INDEPENDENT_AMBULATORY_CARE_PROVIDER_SITE_OTHER)
Admission: RE | Admit: 2018-07-11 | Discharge: 2018-07-11 | Disposition: A | Discharge status: Home / Self Care | Payer: PPO | Source: Ambulatory Visit | Attending: Vascular Surgery | Admitting: Vascular Surgery

## 2018-07-11 ENCOUNTER — Other Ambulatory Visit: Payer: Self-pay

## 2018-07-11 VITALS — BP 128/70 | HR 81 | Temp 97.8°F | Resp 20 | Ht 65.5 in | Wt 174.0 lb

## 2018-07-11 DIAGNOSIS — I739 Peripheral vascular disease, unspecified: Secondary | ICD-10-CM | POA: Insufficient documentation

## 2018-07-11 DIAGNOSIS — I6529 Occlusion and stenosis of unspecified carotid artery: Secondary | ICD-10-CM | POA: Diagnosis not present

## 2018-07-11 MED ORDER — TRAMADOL HCL 50 MG PO TABS: 50.0000 mg | ORAL_TABLET | Freq: Four times a day (QID) | ORAL | 0 refills | Status: DC | PRN

## 2018-07-11 NOTE — Progress Notes (Signed)
Patient name: Lauren Craig MRN: [id number] DOB: January 03, 1946 Sex: female  REASON FOR VISIT:   Follow-up of peripheral vascular disease.  HPI:   Lauren Craig is a pleasant 73 y.o. female who I last saw on 12/27/2017.  She had presented initially with disabling claudication of the left lower extremity.  She underwent an arteriogram in March 2019 which showed that the left common iliac artery was occluded.  There was eccentric calcific plaque in the left common femoral artery.  The posterior tibial artery was occluded.  Given the patient's age and comorbidities including obesity and diabetes we elected a conservative approach.  At the time of her last visit her ABI on the left was 43% with a toe pressure of 75 mmHg.  On the right ABI was 73% with a toe pressure of 95 mmHg.  She was making excellent progress and was largely on a plant-based diet and doing lots of walking.  Her symptoms had improved significantly.  She comes in for 46-month follow-up visit.  She was also concerned about the potential for carotid disease or carotid duplex scan was ordered.  She was on aspirin and a statin.  Since I saw her last she apparently developed a wound on her left fifth toe and was seen by [facility] orthopedics and underwent a left fifth toe amputation on 05/20/18.  Fortunately this healed.  She does describe some mild bilateral calf claudication which is equal on both sides.  She denies any history of rest pain but gets pain in her calves at night which sounds like cramps.  She has requested Ultram for this.  She denies any history of stroke, TIAs, expressive or receptive aphasia, or amaurosis fugax.  She remains largely on a plant-based diet and is trying to stay as active as possible.  She continues to teach high school math.  Past Medical History:  Diagnosis Date  . Arthritis    hands, hips  . Essential (primary) hypertension 1990  . Mixed hyperlipidemia 2010  . Osteomyelitis ([place])    left 5th toe  .  Seasonal allergies   . 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 Diabetes Mellitus II Mother   . Hypertension Father   . Heart Problems Brother        pacemaker  . Diabetes Mellitus II Brother   . Bipolar disorder Brother     SOCIAL HISTORY: Social History   Tobacco Use  . Smoking status: Former Smoker    Types: Cigarettes    Last attempt to quit: 2004    Years since quitting: 16.1  . Smokeless tobacco: Never Used  Substance Use Topics  . Alcohol use: Yes    Frequency: Never    Comment: wine    Allergies  Allergen Reactions  . Penicillins Rash and Other (See Comments)    Has patient had a PCN reaction causing immediate rash, facial/tongue/throat swelling, SOB or lightheadedness with hypotension: Yes Has patient had a PCN reaction causing severe rash involving mucus membranes or skin necrosis: No Has patient had a PCN reaction that required hospitalization: Yes - MD office Has patient had a PCN reaction occurring within the last 10 years: No If all of the above answers are "NO", then may proceed with Cephalosporin use.  Other reaction(s): Other (See Comments), Rash Has patient had a PCN reaction causing immediate rash, facial/tongue/throat swelling, SOB or lightheadedness with hypotension: Yes Has patient had a PCN  reaction causing severe rash involving mucus membranes or skin necrosis: No Has patient had a PCN reaction that required hospitalization: Yes - MD office Has patient had a PCN reaction occurring within the last 10 years: No If all of the above answers are "NO", then may proceed with Cephalosporin use.  . Humalog Mix 50-50  [Insulin Lispro Prot & Lispro]   . Humalog [Insulin Lispro] Itching and Other (See Comments)    Site of injection was inflamed, red, painful episode    Current Outpatient Medications  Medication Sig Dispense Refill  . albuterol (PROVENTIL HFA;VENTOLIN HFA) 108 (90 Base)  MCG/ACT inhaler Inhale 2 puffs into the lungs every 6 (six) hours as needed for wheezing or shortness of breath.    Marland Kitchen atorvastatin (LIPITOR) 40 MG tablet Take 40 mg by mouth daily.    . empagliflozin (JARDIANCE) 10 MG TABS tablet Take 10 mg by mouth daily.    . enalapril (VASOTEC) 5 MG tablet Take 2.5 mg by mouth daily.    Marland Kitchen gabapentin (NEURONTIN) 100 MG capsule Take 100 mg by mouth 2 (two) times daily.    . insulin aspart (NOVOLOG) 100 UNIT/ML FlexPen Inject 25 Units into the skin 2 (two) times daily before a meal.     . Insulin Glargine (LANTUS SOLOSTAR) 100 UNIT/ML Solostar Pen Inject 40 Units into the skin at bedtime.     . Semaglutide,0.25 or 0.5MG /DOS, (OZEMPIC, 0.25 OR 0.5 MG/DOSE,) 2 MG/1.5ML SOPN Inject into the skin.    Marland Kitchen traMADol (ULTRAM) 50 MG tablet Take 1 tablet (50 mg total) by mouth every 6 (six) hours as needed for severe pain. (Patient not taking: Reported on 07/11/2018) 10 tablet 0   No current facility-administered medications for this visit.     REVIEW OF SYSTEMS:  [X]  denotes positive finding, [ ]  denotes negative finding Cardiac  Comments:  Chest pain or chest pressure:    Shortness of breath upon exertion:    Short of breath when lying flat:    Irregular heart rhythm:        Vascular    Pain in calf, thigh, or hip brought on by ambulation: x   Pain in feet at night that wakes you up from your sleep:  x   Blood clot in your veins:    Leg swelling:  x       Pulmonary    Oxygen at home:    Productive cough:     Wheezing:         Neurologic    Sudden weakness in arms or legs:     Sudden numbness in arms or legs:     Sudden onset of difficulty speaking or slurred speech:    Temporary loss of vision in one eye:     Problems with dizziness:         Gastrointestinal    Blood in stool:     Vomited blood:         Genitourinary    Burning when urinating:     Blood in urine:        Psychiatric    Major depression:         Hematologic    Bleeding problems:     Problems with blood clotting too easily:        Skin    Rashes or ulcers:        Constitutional    Fever or chills:     PHYSICAL EXAM:   Vitals:   07/11/18 1606 07/11/18  1611  BP: 126/69 128/70  Pulse: 81   Resp: 20   Temp: 97.8 F (36.6 C)   SpO2: 92%   Weight: 174 lb (78.9 kg)   Height: 5' 5.5" (1.664 m)     GENERAL: The patient is a well-nourished female, in no acute distress. The vital signs are documented above. CARDIAC: There is a regular rate and rhythm.  VASCULAR: I do not detect carotid bruits. I cannot palpate femoral pulses. On the right side I cannot palpate a dorsalis pedis pulse. I cannot palpate pulses in the left foot. Both feet are warm and well-perfused. She has no significant lower extremity swelling. PULMONARY: There is good air exchange bilaterally without wheezing or rales. ABDOMEN: Soft and non-tender with normal pitched bowel sounds.  MUSCULOSKELETAL: She has had an amputation of her left fifth toe. NEUROLOGIC: No focal weakness or paresthesias are detected. SKIN: There are no ulcers or rashes noted. PSYCHIATRIC: The patient has a normal affect.  DATA:    ARTERIAL DOPPLER STUDY: I have independently interpreted her arterial Doppler study today.  On the right side she has a biphasic posterior tibial and dorsalis pedis signal.  Her ABIs 81% with a toe pressure of 87 mmHg.  On the left side she has a monophasic dorsalis pedis and posterior tibial signal.  ABI is 47%.  Toe pressure is 34 mmHg.  CAROTID DUPLEX: I have independently interpreted her carotid duplex scan today.  On the right side there is a less than 39% carotid stenosis.  Right vertebral artery is patent with antegrade flow.  On the left side there is a less than 39% stenosis.  The left vertebral artery is patent with antegrade flow.  ARTERIOGRAM: I did review her arteriogram that was done on 08/11/2017.   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  Fortunately she has minimal symptoms and is very motivated following a largely plant-based diet and exercising regularly.  She is not a smoker.  She is on aspirin and a statin.  In addition she healed the toe amputation on the left which is the side with the iliac occlusion.  I have encouraged her to continue with her conservative treatment.  I will plan on seeing her back in 9 months with follow-up ABIs.  If she develops critical limb ischemia I think there would be 3 options.  She could potentially have an axillobifemoral bypass.  We would need to check arm pressures as the proximal left subclavian is somewhat difficult to visualize on her arch aortogram.  Second option would be left femoral endarterectomy with intraoperative placement of covered stents in the iliac system if I could get through the left common iliac artery.  The third option would be to address the right common iliac artery stenosis with a covered stent and consider a right to left femorofemoral bypass.  Regardless, because of her obesity and diabetes I think she would be at increased risk for a wound infection so hopefully we will continue with conservative treatment and she will not require intervention.  I will see her back in 9 months.  She knows to call sooner if she has problems.  [name] [facility] [phone]

## 2018-08-28 DIAGNOSIS — D519 Vitamin B12 deficiency anemia, unspecified: Secondary | ICD-10-CM | POA: Diagnosis not present

## 2018-08-28 DIAGNOSIS — E1165 Type 2 diabetes mellitus with hyperglycemia: Secondary | ICD-10-CM | POA: Diagnosis not present

## 2018-10-12 DIAGNOSIS — E7800 Pure hypercholesterolemia, unspecified: Secondary | ICD-10-CM | POA: Diagnosis not present

## 2018-10-12 DIAGNOSIS — I10 Essential (primary) hypertension: Secondary | ICD-10-CM | POA: Diagnosis not present

## 2018-10-12 DIAGNOSIS — E785 Hyperlipidemia, unspecified: Secondary | ICD-10-CM | POA: Diagnosis not present

## 2018-10-12 DIAGNOSIS — E1159 Type 2 diabetes mellitus with other circulatory complications: Secondary | ICD-10-CM | POA: Diagnosis not present

## 2018-10-12 DIAGNOSIS — E1165 Type 2 diabetes mellitus with hyperglycemia: Secondary | ICD-10-CM | POA: Diagnosis not present

## 2018-10-12 DIAGNOSIS — Z79899 Other long term (current) drug therapy: Secondary | ICD-10-CM | POA: Diagnosis not present

## 2018-10-12 DIAGNOSIS — Z6831 Body mass index (BMI) 31.0-31.9, adult: Secondary | ICD-10-CM | POA: Diagnosis not present

## 2018-10-12 DIAGNOSIS — Z9119 Patient's noncompliance with other medical treatment and regimen: Secondary | ICD-10-CM | POA: Diagnosis not present

## 2018-10-12 DIAGNOSIS — Z9181 History of falling: Secondary | ICD-10-CM | POA: Diagnosis not present

## 2018-10-12 DIAGNOSIS — M5432 Sciatica, left side: Secondary | ICD-10-CM | POA: Diagnosis not present

## 2018-10-23 DIAGNOSIS — M2560 Stiffness of unspecified joint, not elsewhere classified: Secondary | ICD-10-CM | POA: Diagnosis not present

## 2018-10-23 DIAGNOSIS — R2689 Other abnormalities of gait and mobility: Secondary | ICD-10-CM | POA: Diagnosis not present

## 2018-10-23 DIAGNOSIS — M545 Low back pain: Secondary | ICD-10-CM | POA: Diagnosis not present

## 2018-10-23 DIAGNOSIS — M79605 Pain in left leg: Secondary | ICD-10-CM | POA: Diagnosis not present

## 2018-10-23 DIAGNOSIS — M5430 Sciatica, unspecified side: Secondary | ICD-10-CM | POA: Diagnosis not present

## 2018-10-23 DIAGNOSIS — M6281 Muscle weakness (generalized): Secondary | ICD-10-CM | POA: Diagnosis not present

## 2018-10-23 DIAGNOSIS — R293 Abnormal posture: Secondary | ICD-10-CM | POA: Diagnosis not present

## 2018-10-24 DIAGNOSIS — M9901 Segmental and somatic dysfunction of cervical region: Secondary | ICD-10-CM | POA: Diagnosis not present

## 2018-10-24 DIAGNOSIS — M5020 Other cervical disc displacement, unspecified cervical region: Secondary | ICD-10-CM | POA: Diagnosis not present

## 2018-10-26 DIAGNOSIS — M9903 Segmental and somatic dysfunction of lumbar region: Secondary | ICD-10-CM | POA: Diagnosis not present

## 2018-10-26 DIAGNOSIS — M5186 Other intervertebral disc disorders, lumbar region: Secondary | ICD-10-CM | POA: Diagnosis not present

## 2018-10-31 DIAGNOSIS — M5186 Other intervertebral disc disorders, lumbar region: Secondary | ICD-10-CM | POA: Diagnosis not present

## 2018-10-31 DIAGNOSIS — M9903 Segmental and somatic dysfunction of lumbar region: Secondary | ICD-10-CM | POA: Diagnosis not present

## 2018-11-07 DIAGNOSIS — M5186 Other intervertebral disc disorders, lumbar region: Secondary | ICD-10-CM | POA: Diagnosis not present

## 2018-11-07 DIAGNOSIS — M9903 Segmental and somatic dysfunction of lumbar region: Secondary | ICD-10-CM | POA: Diagnosis not present

## 2018-11-13 DIAGNOSIS — R1110 Vomiting, unspecified: Secondary | ICD-10-CM | POA: Diagnosis not present

## 2018-11-13 DIAGNOSIS — E1165 Type 2 diabetes mellitus with hyperglycemia: Secondary | ICD-10-CM | POA: Diagnosis not present

## 2018-11-13 DIAGNOSIS — R197 Diarrhea, unspecified: Secondary | ICD-10-CM | POA: Diagnosis not present

## 2018-11-13 DIAGNOSIS — I10 Essential (primary) hypertension: Secondary | ICD-10-CM | POA: Diagnosis not present

## 2018-11-13 DIAGNOSIS — Z6831 Body mass index (BMI) 31.0-31.9, adult: Secondary | ICD-10-CM | POA: Diagnosis not present

## 2018-11-14 DIAGNOSIS — M5186 Other intervertebral disc disorders, lumbar region: Secondary | ICD-10-CM | POA: Diagnosis not present

## 2018-11-14 DIAGNOSIS — M9903 Segmental and somatic dysfunction of lumbar region: Secondary | ICD-10-CM | POA: Diagnosis not present

## 2018-11-20 DIAGNOSIS — K529 Noninfective gastroenteritis and colitis, unspecified: Secondary | ICD-10-CM | POA: Diagnosis not present

## 2018-11-21 DIAGNOSIS — M9903 Segmental and somatic dysfunction of lumbar region: Secondary | ICD-10-CM | POA: Diagnosis not present

## 2018-11-21 DIAGNOSIS — M5186 Other intervertebral disc disorders, lumbar region: Secondary | ICD-10-CM | POA: Diagnosis not present

## 2018-11-27 DIAGNOSIS — I10 Essential (primary) hypertension: Secondary | ICD-10-CM | POA: Diagnosis not present

## 2018-11-27 DIAGNOSIS — E1165 Type 2 diabetes mellitus with hyperglycemia: Secondary | ICD-10-CM | POA: Diagnosis not present

## 2018-11-28 DIAGNOSIS — M5186 Other intervertebral disc disorders, lumbar region: Secondary | ICD-10-CM | POA: Diagnosis not present

## 2018-11-28 DIAGNOSIS — M9903 Segmental and somatic dysfunction of lumbar region: Secondary | ICD-10-CM | POA: Diagnosis not present

## 2018-12-05 DIAGNOSIS — M9903 Segmental and somatic dysfunction of lumbar region: Secondary | ICD-10-CM | POA: Diagnosis not present

## 2018-12-05 DIAGNOSIS — M5186 Other intervertebral disc disorders, lumbar region: Secondary | ICD-10-CM | POA: Diagnosis not present

## 2018-12-12 DIAGNOSIS — M9903 Segmental and somatic dysfunction of lumbar region: Secondary | ICD-10-CM | POA: Diagnosis not present

## 2018-12-12 DIAGNOSIS — M5186 Other intervertebral disc disorders, lumbar region: Secondary | ICD-10-CM | POA: Diagnosis not present

## 2018-12-19 DIAGNOSIS — M9903 Segmental and somatic dysfunction of lumbar region: Secondary | ICD-10-CM | POA: Diagnosis not present

## 2018-12-19 DIAGNOSIS — M5186 Other intervertebral disc disorders, lumbar region: Secondary | ICD-10-CM | POA: Diagnosis not present

## 2018-12-31 DIAGNOSIS — R5381 Other malaise: Secondary | ICD-10-CM | POA: Diagnosis not present

## 2018-12-31 DIAGNOSIS — Z6831 Body mass index (BMI) 31.0-31.9, adult: Secondary | ICD-10-CM | POA: Diagnosis not present

## 2018-12-31 DIAGNOSIS — E785 Hyperlipidemia, unspecified: Secondary | ICD-10-CM | POA: Diagnosis not present

## 2018-12-31 DIAGNOSIS — I10 Essential (primary) hypertension: Secondary | ICD-10-CM | POA: Diagnosis not present

## 2018-12-31 DIAGNOSIS — R5383 Other fatigue: Secondary | ICD-10-CM | POA: Diagnosis not present

## 2018-12-31 DIAGNOSIS — D519 Vitamin B12 deficiency anemia, unspecified: Secondary | ICD-10-CM | POA: Diagnosis not present

## 2018-12-31 DIAGNOSIS — E1165 Type 2 diabetes mellitus with hyperglycemia: Secondary | ICD-10-CM | POA: Diagnosis not present

## 2018-12-31 DIAGNOSIS — F329 Major depressive disorder, single episode, unspecified: Secondary | ICD-10-CM | POA: Diagnosis not present

## 2018-12-31 DIAGNOSIS — Z79899 Other long term (current) drug therapy: Secondary | ICD-10-CM | POA: Diagnosis not present

## 2019-01-28 DIAGNOSIS — D519 Vitamin B12 deficiency anemia, unspecified: Secondary | ICD-10-CM | POA: Diagnosis not present

## 2019-01-28 DIAGNOSIS — E785 Hyperlipidemia, unspecified: Secondary | ICD-10-CM | POA: Diagnosis not present

## 2019-01-28 DIAGNOSIS — E1165 Type 2 diabetes mellitus with hyperglycemia: Secondary | ICD-10-CM | POA: Diagnosis not present

## 2019-02-19 DIAGNOSIS — E538 Deficiency of other specified B group vitamins: Secondary | ICD-10-CM | POA: Diagnosis not present

## 2019-02-19 DIAGNOSIS — Z23 Encounter for immunization: Secondary | ICD-10-CM | POA: Diagnosis not present

## 2019-03-29 DIAGNOSIS — E669 Obesity, unspecified: Secondary | ICD-10-CM | POA: Diagnosis not present

## 2019-03-29 DIAGNOSIS — E785 Hyperlipidemia, unspecified: Secondary | ICD-10-CM | POA: Diagnosis not present

## 2019-04-02 DIAGNOSIS — E1165 Type 2 diabetes mellitus with hyperglycemia: Secondary | ICD-10-CM | POA: Diagnosis not present

## 2019-04-02 DIAGNOSIS — D519 Vitamin B12 deficiency anemia, unspecified: Secondary | ICD-10-CM | POA: Diagnosis not present

## 2019-04-24 DIAGNOSIS — E538 Deficiency of other specified B group vitamins: Secondary | ICD-10-CM | POA: Diagnosis not present

## 2019-04-29 DIAGNOSIS — D519 Vitamin B12 deficiency anemia, unspecified: Secondary | ICD-10-CM | POA: Diagnosis not present

## 2019-04-29 DIAGNOSIS — E1165 Type 2 diabetes mellitus with hyperglycemia: Secondary | ICD-10-CM | POA: Diagnosis not present

## 2019-04-29 DIAGNOSIS — E7800 Pure hypercholesterolemia, unspecified: Secondary | ICD-10-CM | POA: Diagnosis not present

## 2019-05-30 DIAGNOSIS — E785 Hyperlipidemia, unspecified: Secondary | ICD-10-CM | POA: Diagnosis not present

## 2019-05-30 DIAGNOSIS — E1165 Type 2 diabetes mellitus with hyperglycemia: Secondary | ICD-10-CM | POA: Diagnosis not present

## 2019-06-12 DIAGNOSIS — Z0189 Encounter for other specified special examinations: Secondary | ICD-10-CM | POA: Diagnosis not present

## 2019-06-12 DIAGNOSIS — E1169 Type 2 diabetes mellitus with other specified complication: Secondary | ICD-10-CM | POA: Diagnosis not present

## 2019-06-12 DIAGNOSIS — E1165 Type 2 diabetes mellitus with hyperglycemia: Secondary | ICD-10-CM | POA: Diagnosis not present

## 2019-06-12 DIAGNOSIS — M545 Low back pain: Secondary | ICD-10-CM | POA: Diagnosis not present

## 2019-06-12 DIAGNOSIS — I10 Essential (primary) hypertension: Secondary | ICD-10-CM | POA: Diagnosis not present

## 2019-06-12 DIAGNOSIS — E7841 Elevated Lipoprotein(a): Secondary | ICD-10-CM | POA: Diagnosis not present

## 2019-06-27 DIAGNOSIS — E1165 Type 2 diabetes mellitus with hyperglycemia: Secondary | ICD-10-CM | POA: Diagnosis not present

## 2019-06-27 DIAGNOSIS — L03116 Cellulitis of left lower limb: Secondary | ICD-10-CM | POA: Diagnosis not present

## 2019-06-27 DIAGNOSIS — S91302A Unspecified open wound, left foot, initial encounter: Secondary | ICD-10-CM | POA: Diagnosis not present

## 2019-06-28 DIAGNOSIS — Z87891 Personal history of nicotine dependence: Secondary | ICD-10-CM | POA: Diagnosis not present

## 2019-06-28 DIAGNOSIS — E11621 Type 2 diabetes mellitus with foot ulcer: Secondary | ICD-10-CM | POA: Diagnosis not present

## 2019-06-28 DIAGNOSIS — Z794 Long term (current) use of insulin: Secondary | ICD-10-CM | POA: Diagnosis not present

## 2019-06-28 DIAGNOSIS — L97509 Non-pressure chronic ulcer of other part of unspecified foot with unspecified severity: Secondary | ICD-10-CM | POA: Diagnosis not present

## 2019-06-28 DIAGNOSIS — L03116 Cellulitis of left lower limb: Secondary | ICD-10-CM | POA: Diagnosis not present

## 2019-06-28 DIAGNOSIS — Z79899 Other long term (current) drug therapy: Secondary | ICD-10-CM | POA: Diagnosis not present

## 2019-06-28 DIAGNOSIS — E08621 Diabetes mellitus due to underlying condition with foot ulcer: Secondary | ICD-10-CM | POA: Diagnosis not present

## 2019-06-28 DIAGNOSIS — E119 Type 2 diabetes mellitus without complications: Secondary | ICD-10-CM | POA: Diagnosis not present

## 2019-06-28 DIAGNOSIS — E785 Hyperlipidemia, unspecified: Secondary | ICD-10-CM | POA: Diagnosis not present

## 2019-06-28 DIAGNOSIS — D649 Anemia, unspecified: Secondary | ICD-10-CM | POA: Diagnosis not present

## 2019-06-28 DIAGNOSIS — D72829 Elevated white blood cell count, unspecified: Secondary | ICD-10-CM | POA: Diagnosis not present

## 2019-06-28 DIAGNOSIS — I10 Essential (primary) hypertension: Secondary | ICD-10-CM | POA: Diagnosis not present

## 2019-06-28 DIAGNOSIS — E1165 Type 2 diabetes mellitus with hyperglycemia: Secondary | ICD-10-CM | POA: Diagnosis not present

## 2019-06-28 DIAGNOSIS — L97526 Non-pressure chronic ulcer of other part of left foot with bone involvement without evidence of necrosis: Secondary | ICD-10-CM | POA: Diagnosis not present

## 2019-06-28 DIAGNOSIS — L02612 Cutaneous abscess of left foot: Secondary | ICD-10-CM | POA: Diagnosis not present

## 2019-06-29 DIAGNOSIS — L03116 Cellulitis of left lower limb: Secondary | ICD-10-CM

## 2019-06-29 DIAGNOSIS — E11621 Type 2 diabetes mellitus with foot ulcer: Secondary | ICD-10-CM

## 2019-07-03 DIAGNOSIS — L03116 Cellulitis of left lower limb: Secondary | ICD-10-CM

## 2019-07-03 DIAGNOSIS — E11621 Type 2 diabetes mellitus with foot ulcer: Secondary | ICD-10-CM

## 2019-07-03 DIAGNOSIS — L97509 Non-pressure chronic ulcer of other part of unspecified foot with unspecified severity: Secondary | ICD-10-CM

## 2019-07-12 ENCOUNTER — Other Ambulatory Visit: Payer: Self-pay

## 2019-07-12 ENCOUNTER — Ambulatory Visit: Payer: PPO | Admitting: Sports Medicine

## 2019-07-12 ENCOUNTER — Other Ambulatory Visit: Payer: Self-pay | Admitting: Sports Medicine

## 2019-07-12 ENCOUNTER — Ambulatory Visit (INDEPENDENT_AMBULATORY_CARE_PROVIDER_SITE_OTHER): Payer: PPO

## 2019-07-12 ENCOUNTER — Encounter: Payer: Self-pay | Admitting: Sports Medicine

## 2019-07-12 DIAGNOSIS — D72829 Elevated white blood cell count, unspecified: Secondary | ICD-10-CM | POA: Insufficient documentation

## 2019-07-12 DIAGNOSIS — L03119 Cellulitis of unspecified part of limb: Secondary | ICD-10-CM | POA: Diagnosis not present

## 2019-07-12 DIAGNOSIS — R739 Hyperglycemia, unspecified: Secondary | ICD-10-CM | POA: Insufficient documentation

## 2019-07-12 DIAGNOSIS — E1165 Type 2 diabetes mellitus with hyperglycemia: Secondary | ICD-10-CM | POA: Diagnosis not present

## 2019-07-12 DIAGNOSIS — L97521 Non-pressure chronic ulcer of other part of left foot limited to breakdown of skin: Secondary | ICD-10-CM | POA: Diagnosis not present

## 2019-07-12 DIAGNOSIS — L02619 Cutaneous abscess of unspecified foot: Secondary | ICD-10-CM

## 2019-07-12 DIAGNOSIS — L03116 Cellulitis of left lower limb: Secondary | ICD-10-CM | POA: Insufficient documentation

## 2019-07-12 DIAGNOSIS — L02612 Cutaneous abscess of left foot: Secondary | ICD-10-CM

## 2019-07-12 DIAGNOSIS — L03032 Cellulitis of left toe: Secondary | ICD-10-CM

## 2019-07-12 DIAGNOSIS — E1142 Type 2 diabetes mellitus with diabetic polyneuropathy: Secondary | ICD-10-CM | POA: Diagnosis not present

## 2019-07-12 DIAGNOSIS — M8610 Other acute osteomyelitis, unspecified site: Secondary | ICD-10-CM | POA: Diagnosis not present

## 2019-07-12 DIAGNOSIS — E11621 Type 2 diabetes mellitus with foot ulcer: Secondary | ICD-10-CM | POA: Insufficient documentation

## 2019-07-12 MED ORDER — TRAMADOL HCL 50 MG PO TABS: 50.0000 mg | ORAL_TABLET | Freq: Four times a day (QID) | ORAL | 0 refills | Status: DC | PRN

## 2019-07-12 NOTE — Progress Notes (Signed)
Subjective: Kamaiya Antilla is a 74 y.o. female patient seen in office for evaluation of ulceration of the left foot sub met 5 and a new spot at the dorsal aspect of the foot. Patient has a history of diabetes and a blood glucose level  today of 145 mg/dl, last A1c 7-8.  Patient is changing the dressing using betadine but on Sunday started noticing some redness, drainage, pain and some maceration. Denies nausea/fever/vomiting/chills/night sweats/shortness of breath. Patient has no other pedal complaints at this time.  Review of Systems  All other systems reviewed and are negative.    Patient Active Problem List   Diagnosis Date Noted  . Cellulitis of foot, left 07/12/2019  . Diabetic foot ulcer ([place]) 07/12/2019  . Hyperglycemia 07/12/2019  . Leukocytosis 07/12/2019  . Acute osteomyelitis ([place]) 04/04/2018  . 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 HTN (hypertension) 10/03/2017  . Hyperlipidemia 10/03/2017  . Uncontrolled type 2 diabetes mellitus with hyperglycemia ([place]) 10/03/2017   Current Outpatient Medications on File Prior to Visit  Medication Sig Dispense Refill  . albuterol (PROVENTIL HFA;VENTOLIN HFA) 108 (90 Base) MCG/ACT inhaler Inhale 2 puffs into the lungs every 6 (six) hours as needed for wheezing or shortness of breath.    Marland Kitchen atorvastatin (LIPITOR) 40 MG tablet Take 40 mg by mouth daily.    . empagliflozin (JARDIANCE) 10 MG TABS tablet Take 10 mg by mouth daily.    . enalapril (VASOTEC) 5 MG tablet Take 2.5 mg by mouth daily.    Marland Kitchen gabapentin (NEURONTIN) 100 MG capsule Take 100 mg by mouth 2 (two) times daily.    . insulin aspart (NOVOLOG) 100 UNIT/ML FlexPen Inject 25 Units into the skin 2 (two) times daily before a meal.     . Insulin Glargine (LANTUS SOLOSTAR) 100 UNIT/ML Solostar Pen Inject 40 Units into the skin at bedtime.     . Semaglutide,0.25 or 0.5MG/DOS, (OZEMPIC, 0.25 OR 0.5 MG/DOSE,) 2 MG/1.5ML SOPN Inject into the skin.      No current facility-administered medications on file prior to visit.   Allergies  Allergen Reactions  . Penicillins Rash and Other (See Comments)    Has patient had a PCN reaction causing immediate rash, facial/tongue/throat swelling, SOB or lightheadedness with hypotension: Yes Has patient had a PCN reaction causing severe rash involving mucus membranes or skin necrosis: No Has patient had a PCN reaction that required hospitalization: Yes - MD office Has patient had a PCN reaction occurring within the last 10 years: No If all of the above answers are "NO", then may proceed with Cephalosporin use.  Other reaction(s): Other (See Comments), Rash Has patient had a PCN reaction causing immediate rash, facial/tongue/throat swelling, SOB or lightheadedness with hypotension: Yes Has patient had a PCN reaction causing severe rash involving mucus membranes or skin necrosis: No Has patient had a PCN reaction that required hospitalization: Yes - MD office Has patient had a PCN reaction occurring within the last 10 years: No If all of the above answers are "NO", then may proceed with Cephalosporin use.  . Humalog Mix 50-50  [Insulin Lispro Prot & Lispro]   . Humalog [Insulin Lispro] Itching and Other (See Comments)    Site of injection was inflamed, red, painful episode  . Semaglutide Nausea And Vomiting  . Metformin Diarrhea    No results found for this or any previous visit (from the past 2160 hour(s)).  Objective: There were no vitals filed for this visit.  General: Patient is awake, alert, oriented x 3 and in no acute distress.  Dermatology: Skin is warm and dry bilateral with a full thickness ulceration present dorsal 5th and plantar 5th on left that probes to capsule dorsally. Ulceration measures 0.5x0.8cm dorsally and 0.3x0.5cm plantarly. There is a  keratotic border with a fibrogranular base. There is no malodor, no active drainage, focal erythema, L>R pitting edema. No other acute  signs of infection.   Vascular: Dorsalis Pedis pulse = 1/4 Bilateral,  Posterior Tibial pulse = 0/4 Bilateral,  Capillary Fill Time < 5 seconds  Neurologic: Protective sensation diminished bilateral.  Musculosketal: There is mild pain with palpation to ulcerated area. No pain with compression to calves bilateral. 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  Xrays, Left foot, bony fragment, possible suggestive of osteomyelitis at 5th met head. No gas in soft tissues.   No results for input(s): GRAMSTAIN, LABORGA in the last 8760 hours.  Assessment and Plan:  Problem List Items Addressed This Visit      Endocrine   Uncontrolled type 2 diabetes mellitus with hyperglycemia ([place])     Musculoskeletal and Integument   Acute osteomyelitis ([place])    Other Visit Diagnoses    Ulcer of left foot, limited to breakdown of skin ([place])    -  Primary   Relevant Orders   WOUND CULTURE   Cellulitis and abscess of toe of left foot       Relevant Orders   WOUND CULTURE   Diabetic polyneuropathy associated with type 2 diabetes mellitus ([place])           -Examined patient and discussed the progression of the wound and treatment alternatives. -Xrays reviewed - Excisionally dedbrided ulceration at left dorsal and plantar 5th to healthy bleeding borders removing nonviable tissue using a sterile chisel blade. Wound measures post debridement as above. Wound was debrided to the level of the dermis with viable wound base exposed to promote healing. Hemostasis was achieved with manuel pressure. Patient tolerated procedure well without any discomfort or anesthesia necessary for this wound debridement.  -Wound culture obtained  -Applied silver cream and dry sterile dressing and instructed patient to continue with daily dressings at home consisting of and bandaid/dry sterile dressing. -Continue with clinda and cipro  -Rx Tramadol for pain  -CT ordered to evaluate for osteomyelitis  - Advised patient to go to the ER or return to  office if the wound worsens or if constitutional symptoms are present. -Patient to return to office in 1 week for follow up care and evaluation or sooner if problems arise.  Landis Martins, DPM

## 2019-07-14 LAB — WOUND CULTURE: Organism ID, Bacteria: NONE SEEN

## 2019-07-15 ENCOUNTER — Other Ambulatory Visit: Payer: Self-pay | Admitting: Sports Medicine

## 2019-07-15 DIAGNOSIS — L02619 Cutaneous abscess of unspecified foot: Secondary | ICD-10-CM

## 2019-07-15 DIAGNOSIS — L03119 Cellulitis of unspecified part of limb: Secondary | ICD-10-CM

## 2019-07-15 DIAGNOSIS — L97521 Non-pressure chronic ulcer of other part of left foot limited to breakdown of skin: Secondary | ICD-10-CM

## 2019-07-19 ENCOUNTER — Other Ambulatory Visit: Payer: Self-pay

## 2019-07-19 ENCOUNTER — Encounter: Payer: Self-pay | Admitting: Sports Medicine

## 2019-07-19 ENCOUNTER — Ambulatory Visit: Payer: PPO | Admitting: Sports Medicine

## 2019-07-19 DIAGNOSIS — L97521 Non-pressure chronic ulcer of other part of left foot limited to breakdown of skin: Secondary | ICD-10-CM | POA: Diagnosis not present

## 2019-07-19 DIAGNOSIS — M868X7 Other osteomyelitis, ankle and foot: Secondary | ICD-10-CM

## 2019-07-19 DIAGNOSIS — M8610 Other acute osteomyelitis, unspecified site: Secondary | ICD-10-CM

## 2019-07-19 DIAGNOSIS — L02612 Cutaneous abscess of left foot: Secondary | ICD-10-CM

## 2019-07-19 DIAGNOSIS — L03032 Cellulitis of left toe: Secondary | ICD-10-CM

## 2019-07-19 DIAGNOSIS — E1165 Type 2 diabetes mellitus with hyperglycemia: Secondary | ICD-10-CM

## 2019-07-19 NOTE — Progress Notes (Signed)
Subjective: Lauren Craig is a 74 y.o. female patient seen in office for follow up evaluation of ulceration of the left foot sub met 5 and dorsal fifth metatarsal head. Patient has a history of diabetes and a blood glucose level  Yesterday of 145 mg/dl, last A1c 7-8.  Patient is changing the dressing using Silvadene and reports that it is looking better occasional sharp pain 6 out of 10 with less swelling reports that there is a little bit of yellow drainage but is doing well with less redness and no warmth no odor.  Patient reports that she is also having increase in left knee pain and is currently walking with a cane. Denies nausea/fever/vomiting/chills/night sweats/shortness of breath. Patient has no other pedal complaints at this time.  Patient Active Problem List   Diagnosis Date Noted  . Cellulitis of foot, left 07/12/2019  . Diabetic foot ulcer ([place]) 07/12/2019  . Hyperglycemia 07/12/2019  . Leukocytosis 07/12/2019  . Acute osteomyelitis ([place]) 04/04/2018  . 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 HTN (hypertension) 10/03/2017  . Hyperlipidemia 10/03/2017  . Uncontrolled type 2 diabetes mellitus with hyperglycemia ([place]) 10/03/2017   Current Outpatient Medications on File Prior to Visit  Medication Sig Dispense Refill  . albuterol (PROVENTIL HFA;VENTOLIN HFA) 108 (90 Base) MCG/ACT inhaler Inhale 2 puffs into the lungs every 6 (six) hours as needed for wheezing or shortness of breath.    Marland Kitchen atorvastatin (LIPITOR) 40 MG tablet Take 40 mg by mouth daily.    Marland Kitchen atorvastatin (LIPITOR) 80 MG tablet Take 80 mg by mouth at bedtime.    . ciprofloxacin (CIPRO) 250 MG tablet TAKE THREE TABLETS (750MG) BY MOUTH TWICE DAILY FOR 12 DAYS.    Marland Kitchen clindamycin (CLEOCIN) 150 MG capsule TAKE 3 CAPSULES (450MG) BY MOUTH EVERY 8 HOURS FOR 12 DAYS.    Marland Kitchen empagliflozin (JARDIANCE) 10 MG TABS tablet Take 10 mg by mouth daily.    . enalapril (VASOTEC) 5 MG tablet Take 2.5 mg by  mouth daily.    Marland Kitchen gabapentin (NEURONTIN) 100 MG capsule Take 100 mg by mouth 2 (two) times daily.    . hydrALAZINE (APRESOLINE) 25 MG tablet Take 25 mg by mouth 3 (three) times daily.    . insulin aspart (NOVOLOG) 100 UNIT/ML FlexPen Inject 25 Units into the skin 2 (two) times daily before a meal.     . Insulin Glargine (LANTUS SOLOSTAR) 100 UNIT/ML Solostar Pen Inject 40 Units into the skin at bedtime.     . meloxicam (MOBIC) 15 MG tablet Take 15 mg by mouth at bedtime.    . meloxicam (MOBIC) 15 MG tablet Take 15 mg by mouth.    . mupirocin ointment (BACTROBAN) 2 % mupirocin 2 % topical ointment    . oxyCODONE (OXY IR/ROXICODONE) 5 MG immediate release tablet oxycodone 5 mg tablet    . Semaglutide,0.25 or 0.5MG/DOS, (OZEMPIC, 0.25 OR 0.5 MG/DOSE,) 2 MG/1.5ML SOPN Inject into the skin.    Marland Kitchen sertraline (ZOLOFT) 50 MG tablet Take 50 mg by mouth.    . traMADol (ULTRAM) 50 MG tablet Take 1 tablet (50 mg total) by mouth every 6 (six) hours as needed. 25 tablet 0  . triamcinolone ointment (KENALOG) 0.1 % APPLY TO AFFECTED AREAS TWICE DAILY AS NEEDED.     No current facility-administered medications on file prior to visit.   Allergies  Allergen Reactions  . Penicillins Rash and Other (See Comments)    Has patient had a PCN  reaction causing immediate rash, facial/tongue/throat swelling, SOB or lightheadedness with hypotension: Yes Has patient had a PCN reaction causing severe rash involving mucus membranes or skin necrosis: No Has patient had a PCN reaction that required hospitalization: Yes - MD office Has patient had a PCN reaction occurring within the last 10 years: No If all of the above answers are "NO", then may proceed with Cephalosporin use.  Other reaction(s): Other (See Comments), Rash Has patient had a PCN reaction causing immediate rash, facial/tongue/throat swelling, SOB or lightheadedness with hypotension: Yes Has patient had a PCN reaction causing severe rash involving mucus  membranes or skin necrosis: No Has patient had a PCN reaction that required hospitalization: Yes - MD office Has patient had a PCN reaction occurring within the last 10 years: No If all of the above answers are "NO", then may proceed with Cephalosporin use.  . Humalog Mix 50-50  [Insulin Lispro Prot & Lispro]   . Humalog [Insulin Lispro] Itching and Other (See Comments)    Site of injection was inflamed, red, painful episode  . Semaglutide Nausea And Vomiting  . Metformin Diarrhea    Recent Results (from the past 2160 hour(s))  WOUND CULTURE     Status: None   Collection Time: 07/12/19  2:05 PM   Specimen: Foot, Left; Wound   WOUND CULTURE AND SENS  Result Value Ref Range   Gram Stain Result Final report    Organism ID, Bacteria Comment     Comment: No white blood cells seen.   Organism ID, Bacteria No organisms seen    Aerobic Bacterial Culture Final report    Organism ID, Bacteria Comment     Comment: No growth in 36 - 48 hours.    Objective: There were no vitals filed for this visit.  General: Patient is awake, alert, oriented x 3 and in no acute distress.  Dermatology: Skin is warm and dry bilateral with a full thickness ulceration present dorsal 5th and plantar 5th on left that probes to capsule dorsally. Ulceration measures 0.5x0.4cm dorsally and 0.3x0.4cm plantarly. There is a  keratotic border with a fibrogranular base. There is no malodor, no active drainage, decreased erythema, L>R pitting edema, much improved from prior. No other acute signs of infection.   Vascular: Dorsalis Pedis pulse = 1/4 Bilateral,  Posterior Tibial pulse = 0/4 Bilateral,  Capillary Fill Time < 5 seconds  Neurologic: Protective sensation diminished bilateral.  Musculosketal: There is minimal pain with palpation to ulcerated area. No pain with compression to calves bilateral. 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   No results for input(s): GRAMSTAIN, LABORGA in the last 8760 hours.  Assessment and  Plan:  Problem List Items Addressed This Visit      Endocrine   Uncontrolled type 2 diabetes mellitus with hyperglycemia ([place])     Musculoskeletal and Integument   Acute osteomyelitis ([place])    Other Visit Diagnoses    Ulcer of left foot, limited to breakdown of skin ([place])    -  Primary   Cellulitis and abscess of toe of left foot           -Examined patient and discussed the progression of the wound and treatment alternatives. - Excisionally dedbrided ulceration at left dorsal and plantar 5th to healthy bleeding borders removing nonviable tissue using a sterile chisel blade. Wound measures post debridement as above. Wound was debrided to the level of the dermis with viable wound base exposed to promote healing. Hemostasis was achieved with manuel pressure. Patient  tolerated procedure well without any discomfort or anesthesia necessary for this wound debridement.  -Applied Medihoney and dry sterile dressing and instructed patient to continue with daily dressings at home consisting of and bandaid/dry sterile dressing. -Continue with clinda and cipro until completed at this time does not need any additional antibiotics since previous wound culture was negative -Continue with tramadol for pain and use of walking cane for knee pain -CT re-ordered to evaluate for osteomyelitis - Advised patient to go to the ER or return to office if the wound worsens or if constitutional symptoms are present. -Patient to return to office in 1-2 weeks for follow up care and evaluation or sooner if problems arise.  Landis Martins, DPM

## 2019-07-19 NOTE — Addendum Note (Signed)
Addended by: Cranford Mon R on: 07/19/2019 04:41 PM   Modules accepted: Orders

## 2019-07-24 ENCOUNTER — Telehealth: Payer: Self-pay | Admitting: *Deleted

## 2019-07-24 NOTE — Telephone Encounter (Signed)
The order was put in on 07-19-2019 and I sent a message to Dr Cannon Kettle and Assunta Found to get the Prior Authorization and to call the patient. Lauren Craig

## 2019-07-25 DIAGNOSIS — M1389 Other specified arthritis, multiple sites: Secondary | ICD-10-CM | POA: Diagnosis not present

## 2019-07-25 DIAGNOSIS — I10 Essential (primary) hypertension: Secondary | ICD-10-CM | POA: Diagnosis not present

## 2019-07-25 DIAGNOSIS — L03116 Cellulitis of left lower limb: Secondary | ICD-10-CM | POA: Diagnosis not present

## 2019-07-25 DIAGNOSIS — E7849 Other hyperlipidemia: Secondary | ICD-10-CM | POA: Diagnosis not present

## 2019-07-25 DIAGNOSIS — E1165 Type 2 diabetes mellitus with hyperglycemia: Secondary | ICD-10-CM | POA: Diagnosis not present

## 2019-07-25 DIAGNOSIS — E1169 Type 2 diabetes mellitus with other specified complication: Secondary | ICD-10-CM | POA: Diagnosis not present

## 2019-08-02 ENCOUNTER — Ambulatory Visit: Payer: PPO | Admitting: Sports Medicine

## 2019-09-11 ENCOUNTER — Ambulatory Visit
Admission: RE | Admit: 2019-09-11 | Discharge: 2019-09-11 | Disposition: A | Discharge status: Home / Self Care | Payer: PPO | Source: Ambulatory Visit | Attending: Sports Medicine | Admitting: Sports Medicine

## 2019-09-11 DIAGNOSIS — M868X7 Other osteomyelitis, ankle and foot: Secondary | ICD-10-CM

## 2019-09-11 DIAGNOSIS — M86172 Other acute osteomyelitis, left ankle and foot: Secondary | ICD-10-CM | POA: Diagnosis not present

## 2019-09-11 IMAGING — CT CT FOOT*L* W/O CM
3 of 4 series · 12 of 33 positions shown, 14 images · non-contrast
Comparison: Left foot x-rays dated [DATE]. CT left foot
dated [DATE].

CLINICAL DATA: Skin ulcer near the fifth metatarsal head. Evaluate
for osteomyelitis. Prior fifth toe amputation.

EXAM:
CT OF THE LEFT FOOT WITHOUT CONTRAST
TECHNIQUE: Multidetector CT imaging of the left foot was performed according to
the standard protocol. Multiplanar CT image reconstructions were
also generated.

[Series 9: lfov lower extremity 2.00 br40 s3 soft · coronal · 0.22mm/px · 3 of 125 slices shown (1 of 2)]
[im 44/125  bone]
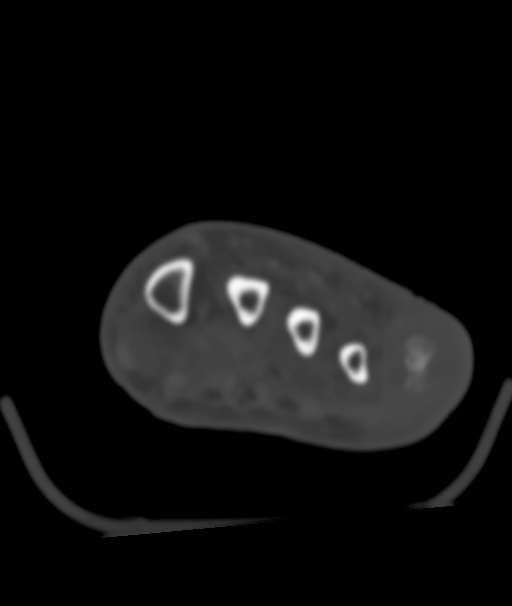
[im 56/125  bone]
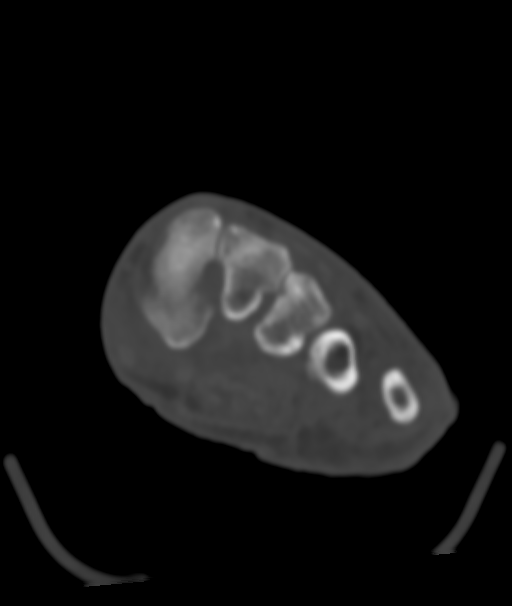
[im 69/125  bone]
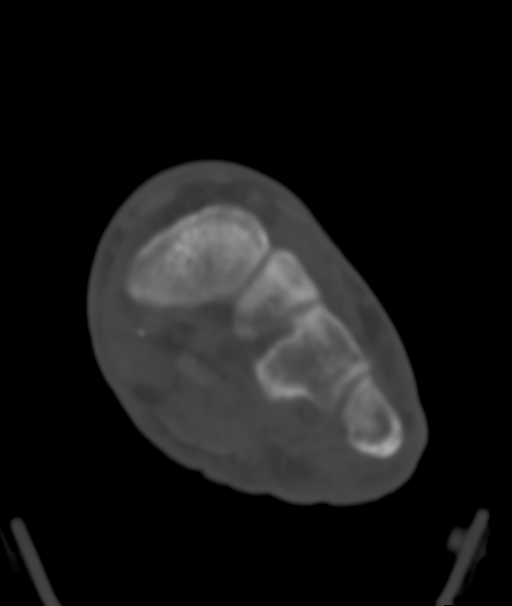

[Series 13: lfov lower extremity 2.00 br40 s3 soft · sagittal · 0.25mm/px · 5 of 56 slices shown, 6 images (2 of 2)]
[im 19/56  bone]
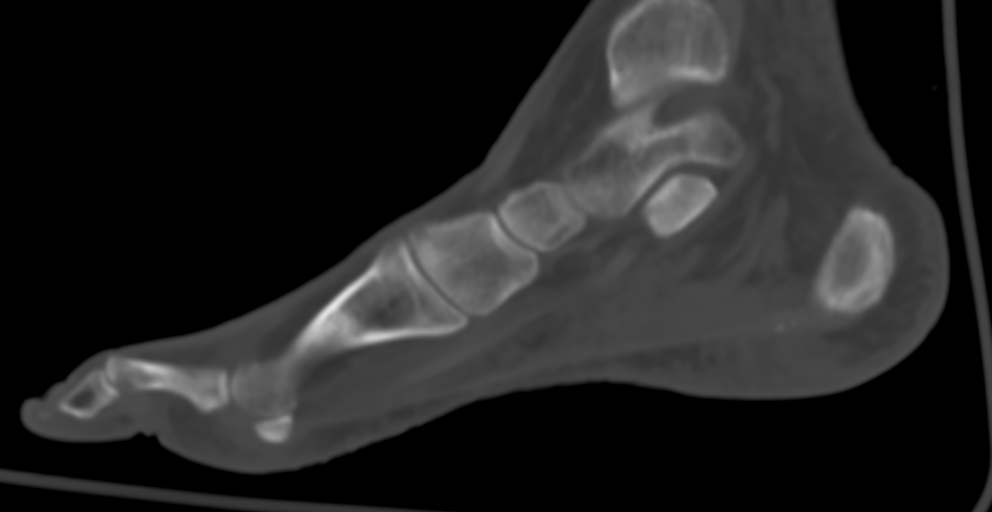
[im 23/56  bone]
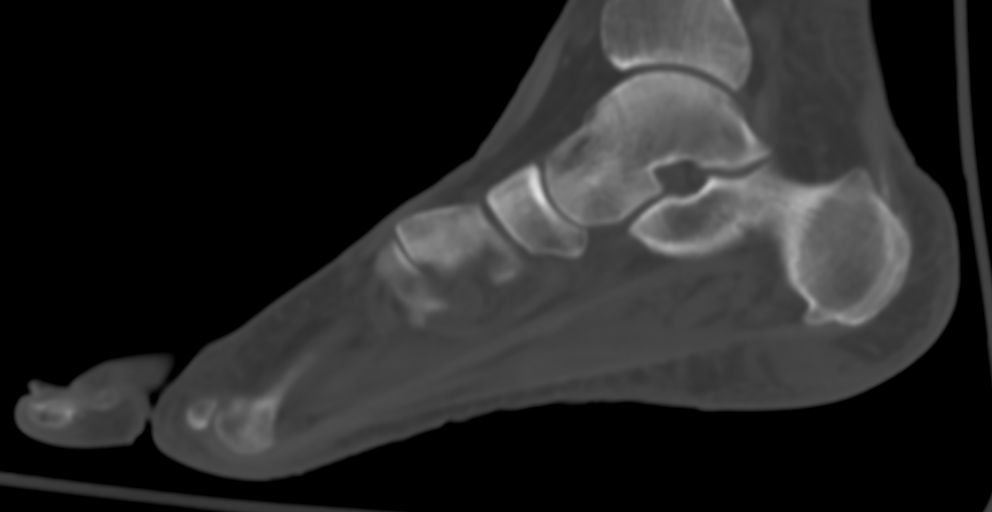
[im 28/56  soft-tissue]
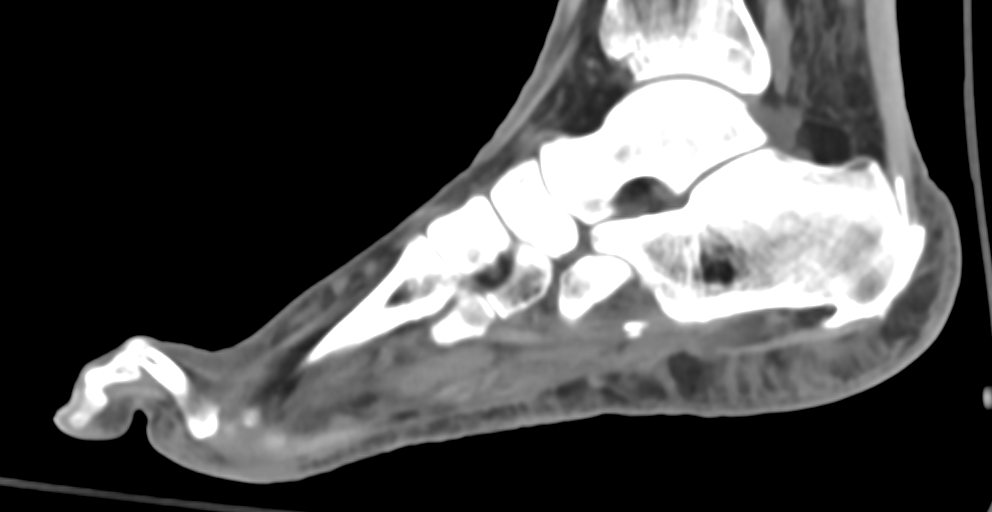
[im 28/56  bone]
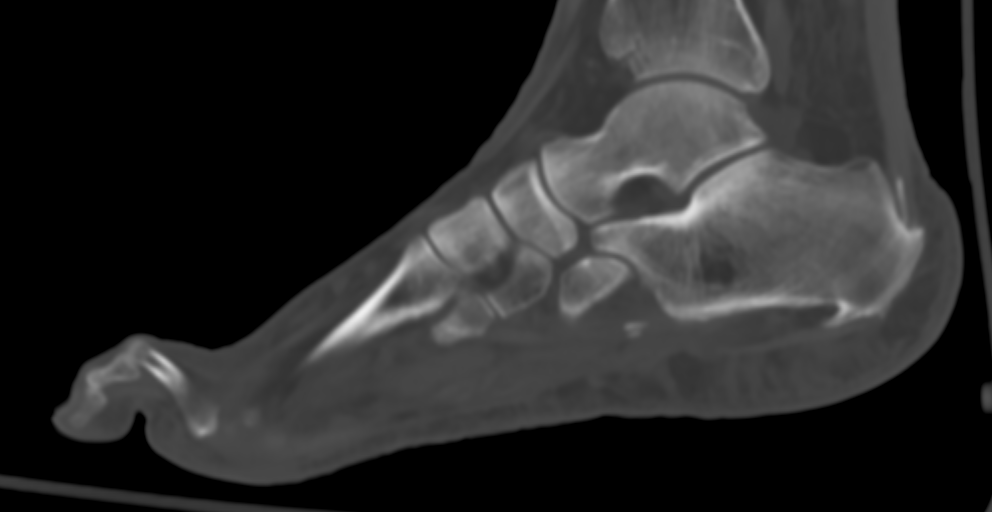
[im 33/56  bone]
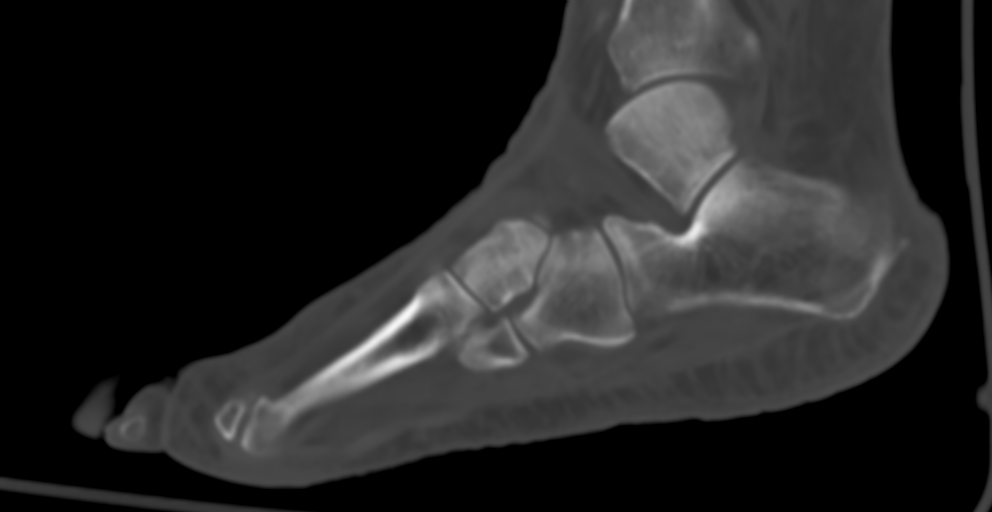
[im 37/56  bone]
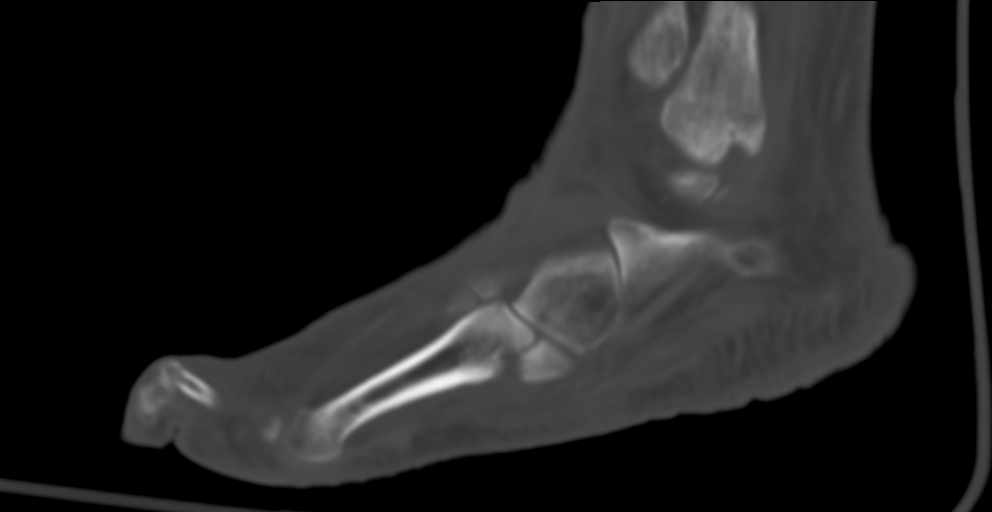

[Series 16: lfov lower extremity 0.60 br40 s3 soft thin · axial · 0.49mm/px · z∈[+454,+530]mm · 4 of 212 slices shown, 5 images]
[im 43/212  soft-tissue]
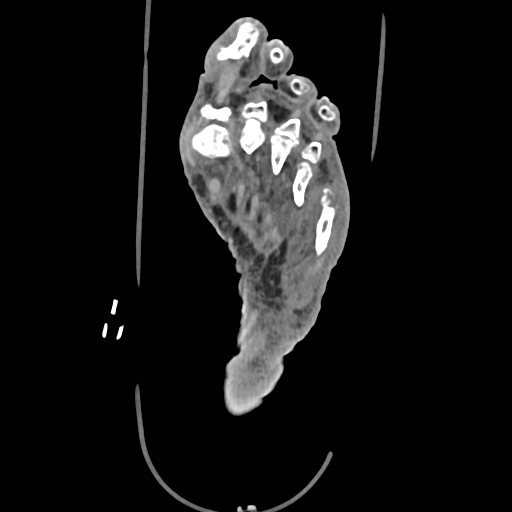
[im 43/212  bone]
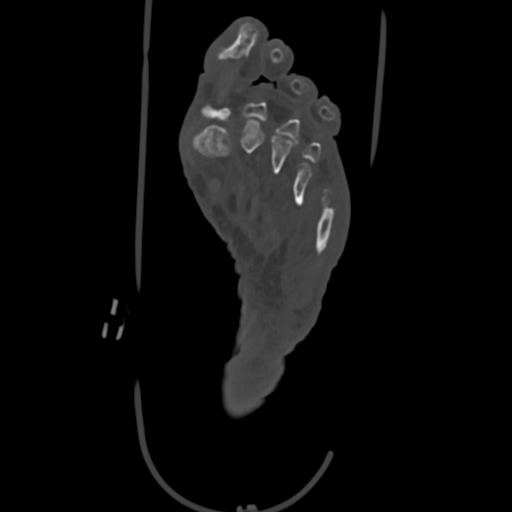
[im 85/212  bone]
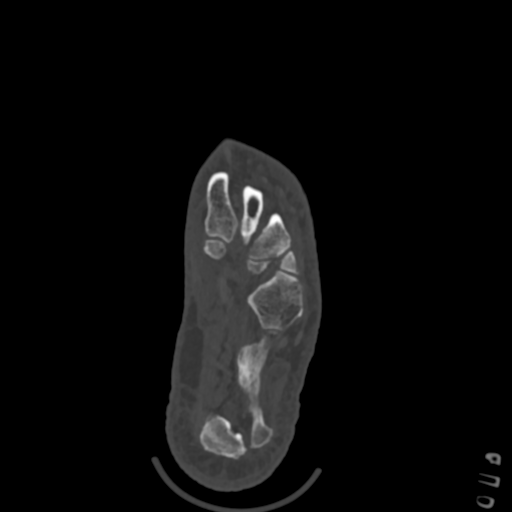
[im 127/212  bone]
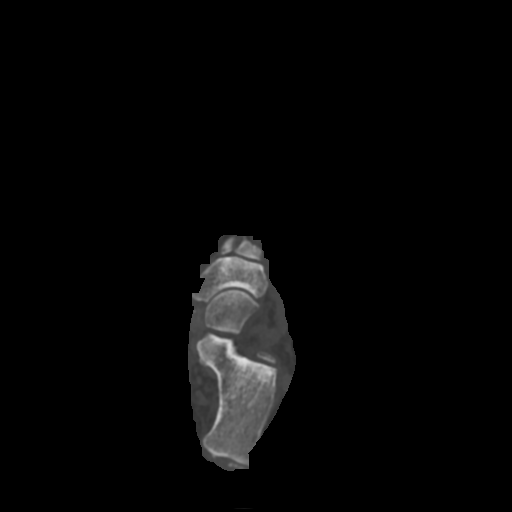
[im 169/212  bone]
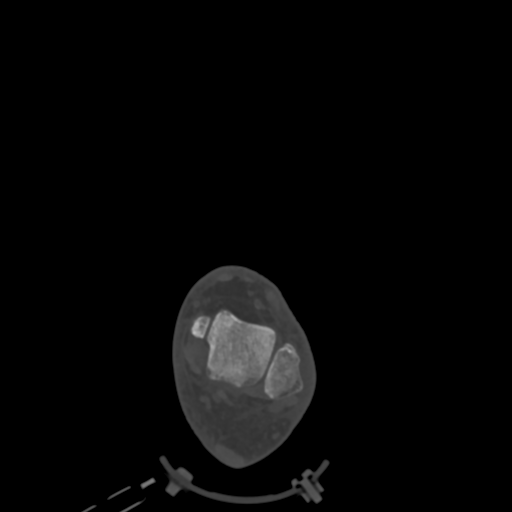

[12 of 33 positions shown; findings below may reference images not displayed]

FINDINGS: Bones/Joint/Cartilage

Bony destruction of the fifth metatarsal head with pathologic
fracture through the neck, progressed since prior x-rays from
[facility]. Findings are new when compared to prior CT.

Prior fifth toe amputation. Unchanged mild osteoarthritis of the
third MTP joint. No joint effusion.

Ligaments

Ligaments are suboptimally evaluated by CT.

Muscles and Tendons
Grossly intact.

Soft tissue
Prominent soft tissue swelling around the fifth metatarsal head. No
fluid collection or hematoma. No soft tissue mass.
IMPRESSION: 1. Osteomyelitis of the fifth metatarsal head with pathologic
fracture through the neck, progressed since prior x-rays from
[DATE]. Prominent soft tissue swelling around the fifth metatarsal head
without discrete abscess.

## 2019-09-12 ENCOUNTER — Telehealth: Payer: Self-pay

## 2019-09-12 NOTE — Telephone Encounter (Signed)
Tried contacting the pt to review CT results but the The mail box is full and can not receive any new messages.

## 2019-09-12 NOTE — Telephone Encounter (Signed)
-----   Message from Landis Martins, [place] sent at 09/11/2019  5:26 PM EDT ----- Will you let patient know that her CT results have been reviewed. 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 She needs to make a follow up appointment with me to discuss treatment options of more IV antibiotics or surgery to remove the infected bone. -Dr. Cannon Kettle

## 2019-09-12 NOTE — Telephone Encounter (Signed)
Called and spoke with pt about CT results, pt stated understanding of results and states she will give the [place] office a call later to schedule up an appt with Dr. Cannon Kettle to discuss treatment options for her infected toe.

## 2019-09-13 ENCOUNTER — Other Ambulatory Visit: Payer: Self-pay

## 2019-09-13 ENCOUNTER — Ambulatory Visit: Payer: PPO | Admitting: Sports Medicine

## 2019-09-13 DIAGNOSIS — M8610 Other acute osteomyelitis, unspecified site: Secondary | ICD-10-CM | POA: Diagnosis not present

## 2019-09-13 DIAGNOSIS — E1142 Type 2 diabetes mellitus with diabetic polyneuropathy: Secondary | ICD-10-CM | POA: Diagnosis not present

## 2019-09-13 DIAGNOSIS — E1165 Type 2 diabetes mellitus with hyperglycemia: Secondary | ICD-10-CM

## 2019-09-13 DIAGNOSIS — L03116 Cellulitis of left lower limb: Secondary | ICD-10-CM

## 2019-09-13 DIAGNOSIS — L97521 Non-pressure chronic ulcer of other part of left foot limited to breakdown of skin: Secondary | ICD-10-CM

## 2019-09-13 MED ORDER — SULFAMETHOXAZOLE-TRIMETHOPRIM 400-80 MG PO TABS: 1.0000 | ORAL_TABLET | Freq: Two times a day (BID) | ORAL | 0 refills | Status: AC

## 2019-09-13 NOTE — Progress Notes (Signed)
Subjective: Lauren Craig is a 74 y.o. female patient seen in office for follow up evaluation of left foot, reports that all the sores have healed but she did go for the CT scan last month and is here to discuss the results. Denies redness, swelling, drainage, denies nausea/fever/vomiting/chills/night sweats/shortness of breath, admits that sometimes when she had pain take tramadol but otherwise she is doing good. Patient has no other pedal complaints at this time.  Patient Active Problem List   Diagnosis Date Noted  . Cellulitis of foot, left 07/12/2019  . Diabetic foot ulcer ([place]) 07/12/2019  . Hyperglycemia 07/12/2019  . Leukocytosis 07/12/2019  . Acute osteomyelitis ([place]) 04/04/2018  . 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 HTN (hypertension) 10/03/2017  . Hyperlipidemia 10/03/2017  . Uncontrolled type 2 diabetes mellitus with hyperglycemia ([place]) 10/03/2017   Current Outpatient Medications on File Prior to Visit  Medication Sig Dispense Refill  . albuterol (PROVENTIL HFA;VENTOLIN HFA) 108 (90 Base) MCG/ACT inhaler Inhale 2 puffs into the lungs every 6 (six) hours as needed for wheezing or shortness of breath.    Marland Kitchen atorvastatin (LIPITOR) 40 MG tablet Take 40 mg by mouth daily.    Marland Kitchen atorvastatin (LIPITOR) 80 MG tablet Take 80 mg by mouth at bedtime.    . ciprofloxacin (CIPRO) 250 MG tablet TAKE THREE TABLETS ('750MG'$ ) BY MOUTH TWICE DAILY FOR 12 DAYS.    Marland Kitchen clindamycin (CLEOCIN) 150 MG capsule TAKE 3 CAPSULES ('450MG'$ ) BY MOUTH EVERY 8 HOURS FOR 12 DAYS.    Marland Kitchen empagliflozin (JARDIANCE) 10 MG TABS tablet Take 10 mg by mouth daily.    . enalapril (VASOTEC) 10 MG tablet Take 10 mg by mouth daily.    . enalapril (VASOTEC) 5 MG tablet Take 2.5 mg by mouth daily.    Marland Kitchen gabapentin (NEURONTIN) 100 MG capsule Take 100 mg by mouth 2 (two) times daily.    . hydrALAZINE (APRESOLINE) 25 MG tablet Take 25 mg by mouth 3 (three) times daily.    . insulin aspart (NOVOLOG)  100 UNIT/ML FlexPen Inject 25 Units into the skin 2 (two) times daily before a meal.     . Insulin Glargine (LANTUS SOLOSTAR) 100 UNIT/ML Solostar Pen Inject 40 Units into the skin at bedtime.     . meloxicam (MOBIC) 15 MG tablet Take 15 mg by mouth at bedtime.    . meloxicam (MOBIC) 15 MG tablet Take 15 mg by mouth.    . mupirocin ointment (BACTROBAN) 2 % mupirocin 2 % topical ointment    . oxyCODONE (OXY IR/ROXICODONE) 5 MG immediate release tablet oxycodone 5 mg tablet    . Semaglutide,0.25 or 0.'5MG'$ /DOS, (OZEMPIC, 0.25 OR 0.5 MG/DOSE,) 2 MG/1.5ML SOPN Inject into the skin.    Marland Kitchen sertraline (ZOLOFT) 50 MG tablet Take 50 mg by mouth.    . traMADol (ULTRAM) 50 MG tablet Take 1 tablet (50 mg total) by mouth every 6 (six) hours as needed. 25 tablet 0  . triamcinolone ointment (KENALOG) 0.1 % APPLY TO AFFECTED AREAS TWICE DAILY AS NEEDED.     No current facility-administered medications on file prior to visit.   Allergies  Allergen Reactions  . Penicillins Rash and Other (See Comments)    Has patient had a PCN reaction causing immediate rash, facial/tongue/throat swelling, SOB or lightheadedness with hypotension: Yes Has patient had a PCN reaction causing severe rash involving mucus membranes or skin necrosis: No Has patient had a PCN reaction that required hospitalization: Yes -  MD office Has patient had a PCN reaction occurring within the last 10 years: No If all of the above answers are "NO", then may proceed with Cephalosporin use.  Other reaction(s): Other (See Comments), Rash Has patient had a PCN reaction causing immediate rash, facial/tongue/throat swelling, SOB or lightheadedness with hypotension: Yes Has patient had a PCN reaction causing severe rash involving mucus membranes or skin necrosis: No Has patient had a PCN reaction that required hospitalization: Yes - MD office Has patient had a PCN reaction occurring within the last 10 years: No If all of the above answers are "NO", then  may proceed with Cephalosporin use.  . Humalog Mix 50-50  [Insulin Lispro Prot & Lispro]   . Humalog [Insulin Lispro] Itching and Other (See Comments)    Site of injection was inflamed, red, painful episode  . Semaglutide Nausea And Vomiting  . Metformin Diarrhea    Recent Results (from the past 2160 hour(s))  WOUND CULTURE     Status: None   Collection Time: 07/12/19  2:05 PM   Specimen: Foot, Left; Wound   WOUND CULTURE AND SENS  Result Value Ref Range   Gram Stain Result Final report    Organism ID, Bacteria Comment     Comment: No white blood cells seen.   Organism ID, Bacteria No organisms seen    Aerobic Bacterial Culture Final report    Organism ID, Bacteria Comment     Comment: No growth in 36 - 48 hours.    Objective: There were no vitals filed for this visit.  General: Patient is awake, alert, oriented x 3 and in no acute distress.  Dermatology: No open wound to the left foot, all previous ulcerations are healed, There is no erythema, no warmth, minimal swelling. No other acute signs of infection.   Vascular: Dorsalis Pedis pulse = 1/4 Bilateral,  Posterior Tibial pulse = 0/4 Bilateral,  Capillary Fill Time < 5 seconds.  Neurologic: Protective sensation diminished bilateral.  Musculosketal: There is no pain with palpation left foot. No pain with compression to calves bilateral. 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   No results for input(s): GRAMSTAIN, LABORGA in the last 8760 hours.  Assessment and Plan:  Problem List Items Addressed This Visit      Endocrine   Uncontrolled type 2 diabetes mellitus with hyperglycemia ([facility])   Relevant Medications   enalapril (VASOTEC) 10 MG tablet     Musculoskeletal and Integument   Acute osteomyelitis ([facility]) - Primary   Relevant Medications   sulfamethoxazole-trimethoprim (BACTRIM) 400-80 MG tablet   Cellulitis of foot, left    Other Visit Diagnoses    Ulcer of left foot, limited to breakdown of skin ([place])       HEALED     Diabetic polyneuropathy associated with type 2 diabetes mellitus ([facility])       Relevant Medications   enalapril (VASOTEC) 10 MG tablet     -Examined patient  -CT results reviewed and suggest Osteomyelitis and pathologic fracture at 5th met head  -All wounds have healed on left with no acute signs of infection -Advised patient that since wounds are healed there is no need for IV antibiotics dispite the CT findings or surgery since her foot does not show any acute symptoms however at this time due to patient history and risks will treat these bone changes with PO bactrim x 6 weeks -Advised stiff sole shoe to avoid worsening of pathologic fracture - Advised patient to go to the ER or return  to office if the wound worsens or if constitutional symptoms are present. -Patient to return to office in 3 weeks for follow up care and xrays or sooner if problems arise.  Landis Martins, DPM

## 2019-09-18 ENCOUNTER — Ambulatory Visit: Payer: PPO | Admitting: Sports Medicine

## 2019-09-19 DIAGNOSIS — E1169 Type 2 diabetes mellitus with other specified complication: Secondary | ICD-10-CM | POA: Diagnosis not present

## 2019-09-19 DIAGNOSIS — E7849 Other hyperlipidemia: Secondary | ICD-10-CM | POA: Diagnosis not present

## 2019-09-19 DIAGNOSIS — I10 Essential (primary) hypertension: Secondary | ICD-10-CM | POA: Diagnosis not present

## 2019-09-19 DIAGNOSIS — E559 Vitamin D deficiency, unspecified: Secondary | ICD-10-CM | POA: Diagnosis not present

## 2019-09-19 DIAGNOSIS — H6093 Unspecified otitis externa, bilateral: Secondary | ICD-10-CM | POA: Diagnosis not present

## 2019-10-04 ENCOUNTER — Ambulatory Visit: Payer: PPO | Admitting: Sports Medicine

## 2019-10-11 DIAGNOSIS — M79672 Pain in left foot: Secondary | ICD-10-CM | POA: Diagnosis not present

## 2019-10-11 DIAGNOSIS — S92353A Displaced fracture of fifth metatarsal bone, unspecified foot, initial encounter for closed fracture: Secondary | ICD-10-CM | POA: Insufficient documentation

## 2019-10-11 DIAGNOSIS — M869 Osteomyelitis, unspecified: Secondary | ICD-10-CM | POA: Diagnosis not present

## 2019-10-11 DIAGNOSIS — S92352A Displaced fracture of fifth metatarsal bone, left foot, initial encounter for closed fracture: Secondary | ICD-10-CM | POA: Diagnosis not present

## 2019-10-14 ENCOUNTER — Other Ambulatory Visit (HOSPITAL_COMMUNITY): Payer: Self-pay | Admitting: Orthopedic Surgery

## 2019-10-31 DIAGNOSIS — H9203 Otalgia, bilateral: Secondary | ICD-10-CM | POA: Diagnosis not present

## 2019-10-31 DIAGNOSIS — H938X3 Other specified disorders of ear, bilateral: Secondary | ICD-10-CM | POA: Diagnosis not present

## 2019-10-31 DIAGNOSIS — M26623 Arthralgia of bilateral temporomandibular joint: Secondary | ICD-10-CM | POA: Diagnosis not present

## 2019-10-31 DIAGNOSIS — J342 Deviated nasal septum: Secondary | ICD-10-CM | POA: Diagnosis not present

## 2019-11-08 ENCOUNTER — Other Ambulatory Visit: Payer: Self-pay

## 2019-11-08 ENCOUNTER — Encounter (HOSPITAL_BASED_OUTPATIENT_CLINIC_OR_DEPARTMENT_OTHER): Payer: Self-pay | Admitting: Orthopedic Surgery

## 2019-11-11 ENCOUNTER — Encounter (HOSPITAL_BASED_OUTPATIENT_CLINIC_OR_DEPARTMENT_OTHER)
Admission: RE | Admit: 2019-11-11 | Discharge: 2019-11-11 | Disposition: A | Discharge status: Home / Self Care | Payer: PPO | Source: Ambulatory Visit | Attending: Orthopedic Surgery | Admitting: Orthopedic Surgery

## 2019-11-11 ENCOUNTER — Other Ambulatory Visit (HOSPITAL_COMMUNITY)
Admission: RE | Admit: 2019-11-11 | Discharge: 2019-11-11 | Disposition: A | Discharge status: Home / Self Care | Payer: PPO | Source: Ambulatory Visit | Attending: Orthopedic Surgery | Admitting: Orthopedic Surgery

## 2019-11-11 DIAGNOSIS — Z20822 Contact with and (suspected) exposure to covid-19: Secondary | ICD-10-CM | POA: Diagnosis not present

## 2019-11-11 DIAGNOSIS — Z01818 Encounter for other preprocedural examination: Secondary | ICD-10-CM | POA: Insufficient documentation

## 2019-11-11 DIAGNOSIS — Z01812 Encounter for preprocedural laboratory examination: Secondary | ICD-10-CM | POA: Diagnosis not present

## 2019-11-11 LAB — SARS CORONAVIRUS 2 (TAT 6-24 HRS): SARS Coronavirus 2: NEGATIVE

## 2019-11-11 LAB — BASIC METABOLIC PANEL
Anion gap: 10 (ref 5–15)
BUN: 13 mg/dL (ref 8–23)
CO2: 24 mmol/L (ref 22–32)
Calcium: 9 mg/dL (ref 8.9–10.3)
Chloride: 103 mmol/L (ref 98–111)
Creatinine, Ser: 0.92 mg/dL (ref 0.44–1.00)
GFR calc Af Amer: >60 mL/min (ref >60)
GFR calc non Af Amer: >60 mL/min (ref >60)
Glucose, Bld: 202 mg/dL — ABNORMAL HIGH (ref 70–99)
Potassium: 4.4 mmol/L (ref 3.5–5.1)
Sodium: 137 mmol/L (ref 135–145)

## 2019-11-11 NOTE — Progress Notes (Signed)

## 2019-11-14 ENCOUNTER — Ambulatory Visit (HOSPITAL_BASED_OUTPATIENT_CLINIC_OR_DEPARTMENT_OTHER): Payer: PPO | Admitting: Anesthesiology

## 2019-11-14 ENCOUNTER — Ambulatory Visit (HOSPITAL_BASED_OUTPATIENT_CLINIC_OR_DEPARTMENT_OTHER)
Admission: RE | Admit: 2019-11-14 | Discharge: 2019-11-14 | Disposition: A | Discharge status: Home / Self Care | Payer: PPO | Attending: Orthopedic Surgery | Admitting: Orthopedic Surgery

## 2019-11-14 ENCOUNTER — Encounter (HOSPITAL_BASED_OUTPATIENT_CLINIC_OR_DEPARTMENT_OTHER): Payer: Self-pay | Admitting: Orthopedic Surgery

## 2019-11-14 ENCOUNTER — Encounter (HOSPITAL_BASED_OUTPATIENT_CLINIC_OR_DEPARTMENT_OTHER)
Admission: RE | Disposition: A | Discharge status: Home / Self Care | Payer: Self-pay | Source: Home / Self Care | Attending: Orthopedic Surgery

## 2019-11-14 ENCOUNTER — Other Ambulatory Visit: Payer: Self-pay

## 2019-11-14 DIAGNOSIS — Z87891 Personal history of nicotine dependence: Secondary | ICD-10-CM | POA: Insufficient documentation

## 2019-11-14 DIAGNOSIS — M160 Bilateral primary osteoarthritis of hip: Secondary | ICD-10-CM | POA: Insufficient documentation

## 2019-11-14 DIAGNOSIS — X58XXXA Exposure to other specified factors, initial encounter: Secondary | ICD-10-CM | POA: Insufficient documentation

## 2019-11-14 DIAGNOSIS — Z8249 Family history of ischemic heart disease and other diseases of the circulatory system: Secondary | ICD-10-CM | POA: Diagnosis not present

## 2019-11-14 DIAGNOSIS — S92352A Displaced fracture of fifth metatarsal bone, left foot, initial encounter for closed fracture: Secondary | ICD-10-CM | POA: Insufficient documentation

## 2019-11-14 DIAGNOSIS — I10 Essential (primary) hypertension: Secondary | ICD-10-CM | POA: Insufficient documentation

## 2019-11-14 DIAGNOSIS — Z791 Long term (current) use of non-steroidal anti-inflammatories (NSAID): Secondary | ICD-10-CM | POA: Insufficient documentation

## 2019-11-14 DIAGNOSIS — E1169 Type 2 diabetes mellitus with other specified complication: Secondary | ICD-10-CM | POA: Insufficient documentation

## 2019-11-14 DIAGNOSIS — Z833 Family history of diabetes mellitus: Secondary | ICD-10-CM | POA: Diagnosis not present

## 2019-11-14 DIAGNOSIS — Z6830 Body mass index (BMI) 30.0-30.9, adult: Secondary | ICD-10-CM | POA: Insufficient documentation

## 2019-11-14 DIAGNOSIS — Z880 Allergy status to penicillin: Secondary | ICD-10-CM | POA: Insufficient documentation

## 2019-11-14 DIAGNOSIS — M19042 Primary osteoarthritis, left hand: Secondary | ICD-10-CM | POA: Insufficient documentation

## 2019-11-14 DIAGNOSIS — M86672 Other chronic osteomyelitis, left ankle and foot: Secondary | ICD-10-CM | POA: Insufficient documentation

## 2019-11-14 DIAGNOSIS — E782 Mixed hyperlipidemia: Secondary | ICD-10-CM | POA: Insufficient documentation

## 2019-11-14 DIAGNOSIS — M8610 Other acute osteomyelitis, unspecified site: Secondary | ICD-10-CM

## 2019-11-14 DIAGNOSIS — Z888 Allergy status to other drugs, medicaments and biological substances status: Secondary | ICD-10-CM | POA: Diagnosis not present

## 2019-11-14 DIAGNOSIS — Z794 Long term (current) use of insulin: Secondary | ICD-10-CM | POA: Diagnosis not present

## 2019-11-14 DIAGNOSIS — E669 Obesity, unspecified: Secondary | ICD-10-CM | POA: Diagnosis not present

## 2019-11-14 DIAGNOSIS — Z79899 Other long term (current) drug therapy: Secondary | ICD-10-CM | POA: Insufficient documentation

## 2019-11-14 DIAGNOSIS — M86172 Other acute osteomyelitis, left ankle and foot: Secondary | ICD-10-CM | POA: Diagnosis not present

## 2019-11-14 DIAGNOSIS — M86272 Subacute osteomyelitis, left ankle and foot: Secondary | ICD-10-CM | POA: Diagnosis not present

## 2019-11-14 DIAGNOSIS — L97529 Non-pressure chronic ulcer of other part of left foot with unspecified severity: Secondary | ICD-10-CM | POA: Diagnosis not present

## 2019-11-14 DIAGNOSIS — M19041 Primary osteoarthritis, right hand: Secondary | ICD-10-CM | POA: Diagnosis not present

## 2019-11-14 DIAGNOSIS — E11621 Type 2 diabetes mellitus with foot ulcer: Secondary | ICD-10-CM | POA: Diagnosis not present

## 2019-11-14 HISTORY — PX: AMPUTATION: SHX166

## 2019-11-14 LAB — GLUCOSE, CAPILLARY
Glucose-Capillary: 194 mg/dL — ABNORMAL HIGH (ref 70–99)
Glucose-Capillary: 189 mg/dL — ABNORMAL HIGH (ref 70–99)
Glucose-Capillary: 178 mg/dL — ABNORMAL HIGH (ref 70–99)

## 2019-11-14 SURGERY — AMPUTATION, FOOT, RAY
Anesthesia: Monitor Anesthesia Care | Site: Foot | Laterality: Left

## 2019-11-14 MED ORDER — CEFAZOLIN SODIUM-DEXTROSE 1-4 GM/50ML-% IV SOLN
INTRAVENOUS | Status: AC
  Filled 2019-11-14: qty 50

## 2019-11-14 MED ORDER — PROPOFOL 500 MG/50ML IV EMUL
INTRAVENOUS | Status: DC | PRN
  Administered 2019-11-14: 50 ug/kg/min via INTRAVENOUS

## 2019-11-14 MED ORDER — LABETALOL HCL 5 MG/ML IV SOLN
INTRAVENOUS | Status: AC
  Filled 2019-11-14: qty 4

## 2019-11-14 MED ORDER — FENTANYL CITRATE (PF) 100 MCG/2ML IJ SOLN
100.0000 ug | Freq: Once | INTRAMUSCULAR | Status: AC
  Administered 2019-11-14: 50 ug via INTRAVENOUS

## 2019-11-14 MED ORDER — ONDANSETRON HCL 4 MG/2ML IJ SOLN
INTRAMUSCULAR | Status: DC | PRN
  Administered 2019-11-14: 4 mg via INTRAVENOUS

## 2019-11-14 MED ORDER — LIDOCAINE 2% (20 MG/ML) 5 ML SYRINGE
INTRAMUSCULAR | Status: DC | PRN
  Administered 2019-11-14: 40 mg via INTRAVENOUS

## 2019-11-14 MED ORDER — 0.9 % SODIUM CHLORIDE (POUR BTL) OPTIME
TOPICAL | Status: DC | PRN
  Administered 2019-11-14: 120 mL

## 2019-11-14 MED ORDER — FENTANYL CITRATE (PF) 100 MCG/2ML IJ SOLN: 25.0000 ug | INTRAMUSCULAR | Status: DC | PRN

## 2019-11-14 MED ORDER — OXYCODONE HCL 5 MG/5ML PO SOLN: 5.0000 mg | Freq: Once | ORAL | Status: DC | PRN

## 2019-11-14 MED ORDER — ONDANSETRON HCL 4 MG/2ML IJ SOLN: 4.0000 mg | Freq: Once | INTRAMUSCULAR | Status: DC | PRN

## 2019-11-14 MED ORDER — INSULIN ASPART 100 UNIT/ML ~~LOC~~ SOLN
5.0000 [IU] | Freq: Once | SUBCUTANEOUS | Status: AC
  Administered 2019-11-14: 5 [IU] via SUBCUTANEOUS

## 2019-11-14 MED ORDER — MIDAZOLAM HCL 2 MG/2ML IJ SOLN
INTRAMUSCULAR | Status: AC
  Filled 2019-11-14: qty 2

## 2019-11-14 MED ORDER — LABETALOL HCL 5 MG/ML IV SOLN
5.0000 mg | Freq: Once | INTRAVENOUS | Status: AC
  Administered 2019-11-14: 5 mg via INTRAVENOUS

## 2019-11-14 MED ORDER — CEFAZOLIN SODIUM-DEXTROSE 2-4 GM/100ML-% IV SOLN
2.0000 g | INTRAVENOUS | Status: AC
  Administered 2019-11-14: 2 g via INTRAVENOUS

## 2019-11-14 MED ORDER — PROPOFOL 10 MG/ML IV BOLUS
INTRAVENOUS | Status: DC | PRN
  Administered 2019-11-14: 20 mg via INTRAVENOUS

## 2019-11-14 MED ORDER — FENTANYL CITRATE (PF) 100 MCG/2ML IJ SOLN
INTRAMUSCULAR | Status: AC
  Filled 2019-11-14: qty 2

## 2019-11-14 MED ORDER — VANCOMYCIN HCL 500 MG IV SOLR
INTRAVENOUS | Status: DC | PRN
  Administered 2019-11-14: 500 mg via TOPICAL

## 2019-11-14 MED ORDER — PROPOFOL 10 MG/ML IV BOLUS
INTRAVENOUS | Status: AC
  Filled 2019-11-14: qty 20

## 2019-11-14 MED ORDER — SODIUM CHLORIDE 0.9 % IV SOLN: INTRAVENOUS | Status: DC

## 2019-11-14 MED ORDER — INSULIN ASPART 100 UNIT/ML ~~LOC~~ SOLN
SUBCUTANEOUS | Status: AC
  Filled 2019-11-14: qty 1

## 2019-11-14 MED ORDER — LABETALOL HCL 5 MG/ML IV SOLN
5.0000 mg | INTRAVENOUS | Status: AC | PRN
  Administered 2019-11-14 (×2): 5 mg via INTRAVENOUS

## 2019-11-14 MED ORDER — LACTATED RINGERS IV SOLN: INTRAVENOUS | Status: DC

## 2019-11-14 MED ORDER — OXYCODONE HCL 5 MG PO TABS: 5.0000 mg | ORAL_TABLET | Freq: Once | ORAL | Status: DC | PRN

## 2019-11-14 MED ORDER — BUPIVACAINE HCL (PF) 0.5 % IJ SOLN
INTRAMUSCULAR | Status: DC | PRN
Start: 2019-11-14 — End: 2019-11-14
  Administered 2019-11-14: 30 mL via PERINEURAL

## 2019-11-14 SURGICAL SUPPLY — 69 items
BLADE AVERAGE 25MMX9MM (BLADE) ×1
BLADE AVERAGE 25X9 (BLADE) ×2 IMPLANT
BLADE MICRO SAGITTAL (BLADE) IMPLANT
BLADE OSC/SAG .038X5.5 CUT EDG (BLADE) IMPLANT
BLADE SURG 10 STRL SS (BLADE) ×3 IMPLANT
BLADE SURG 15 STRL LF DISP TIS (BLADE) ×2 IMPLANT
BLADE SURG 15 STRL SS (BLADE) ×4
BNDG COHESIVE 4X5 TAN STRL (GAUZE/BANDAGES/DRESSINGS) ×3 IMPLANT
BNDG CONFORM 3 STRL LF (GAUZE/BANDAGES/DRESSINGS) ×3 IMPLANT
BNDG ESMARK 4X9 LF (GAUZE/BANDAGES/DRESSINGS) ×3 IMPLANT
BRUSH SCRUB EZ PLAIN DRY (MISCELLANEOUS) IMPLANT
CHLORAPREP W/TINT 26 (MISCELLANEOUS) ×3 IMPLANT
CNTNR URN SCR LID CUP LEK RST (MISCELLANEOUS) ×1 IMPLANT
CONT SPEC 4OZ STRL OR WHT (MISCELLANEOUS) ×2
COVER BACK TABLE 60X90IN (DRAPES) ×3 IMPLANT
COVER WAND RF STERILE (DRAPES) IMPLANT
DECANTER SPIKE VIAL GLASS SM (MISCELLANEOUS) IMPLANT
DRAPE EXTREMITY T 121X128X90 (DISPOSABLE) ×3 IMPLANT
DRAPE OEC MINIVIEW 54X84 (DRAPES) IMPLANT
DRAPE SURG 17X23 STRL (DRAPES) IMPLANT
DRAPE U-SHAPE 47X51 STRL (DRAPES) ×3 IMPLANT
DRSG MEPITEL 4X7.2 (GAUZE/BANDAGES/DRESSINGS) ×3 IMPLANT
DRSG PAD ABDOMINAL 8X10 ST (GAUZE/BANDAGES/DRESSINGS) IMPLANT
ELECT REM PT RETURN 9FT ADLT (ELECTROSURGICAL) ×3
ELECTRODE REM PT RTRN 9FT ADLT (ELECTROSURGICAL) ×1 IMPLANT
GAUZE SPONGE 4X4 12PLY STRL (GAUZE/BANDAGES/DRESSINGS) ×3 IMPLANT
GLOVE BIO SURGEON STRL SZ8 (GLOVE) ×3 IMPLANT
GLOVE BIOGEL PI IND STRL 7.0 (GLOVE) ×2 IMPLANT
GLOVE BIOGEL PI IND STRL 8 (GLOVE) ×2 IMPLANT
GLOVE BIOGEL PI INDICATOR 7.0 (GLOVE) ×4
GLOVE BIOGEL PI INDICATOR 8 (GLOVE) ×4
GLOVE ECLIPSE 8.0 STRL XLNG CF (GLOVE) ×3 IMPLANT
GLOVE SURG SS PI 7.0 STRL IVOR (GLOVE) ×3 IMPLANT
GOWN STRL REUS W/ TWL LRG LVL3 (GOWN DISPOSABLE) ×1 IMPLANT
GOWN STRL REUS W/ TWL XL LVL3 (GOWN DISPOSABLE) ×2 IMPLANT
GOWN STRL REUS W/TWL LRG LVL3 (GOWN DISPOSABLE) ×2
GOWN STRL REUS W/TWL XL LVL3 (GOWN DISPOSABLE) ×4
NDL SAFETY ECLIPSE 18X1.5 (NEEDLE) IMPLANT
NEEDLE HYPO 18GX1.5 SHARP (NEEDLE)
NEEDLE HYPO 25X1 1.5 SAFETY (NEEDLE) IMPLANT
NS IRRIG 1000ML POUR BTL (IV SOLUTION) ×3 IMPLANT
PAD CAST 4YDX4 CTTN HI CHSV (CAST SUPPLIES) ×1 IMPLANT
PADDING CAST COTTON 4X4 STRL (CAST SUPPLIES) ×2
PENCIL SMOKE EVACUATOR (MISCELLANEOUS) ×3 IMPLANT
SANITIZER HAND PURELL 535ML FO (MISCELLANEOUS) IMPLANT
SET BASIN DAY SURGERY F.S. (CUSTOM PROCEDURE TRAY) ×3 IMPLANT
SHEET MEDIUM DRAPE 40X70 STRL (DRAPES) ×3 IMPLANT
SLEEVE SCD COMPRESS KNEE MED (MISCELLANEOUS) ×3 IMPLANT
SPONGE LAP 18X18 RF (DISPOSABLE) ×3 IMPLANT
STOCKINETTE 6  STRL (DRAPES) ×2
STOCKINETTE 6 STRL (DRAPES) ×1 IMPLANT
SUCTION FRAZIER HANDLE 10FR (MISCELLANEOUS)
SUCTION TUBE FRAZIER 10FR DISP (MISCELLANEOUS) IMPLANT
SUT ETHILON 2 0 FS 18 (SUTURE) IMPLANT
SUT ETHILON 2 0 FSLX (SUTURE) IMPLANT
SUT ETHILON 3 0 PS 1 (SUTURE) ×3 IMPLANT
SUT MNCRL AB 3-0 PS2 18 (SUTURE) IMPLANT
SUT PDS AB 0 CT 36 (SUTURE) IMPLANT
SUT PDS AB 2-0 CT2 27 (SUTURE) IMPLANT
SUT VIC AB 2-0 SH 27 (SUTURE) ×2
SUT VIC AB 2-0 SH 27XBRD (SUTURE) ×1 IMPLANT
SWAB COLLECTION DEVICE MRSA (MISCELLANEOUS) IMPLANT
SYR BULB EAR ULCER 3OZ GRN STR (SYRINGE) ×3 IMPLANT
SYR CONTROL 10ML LL (SYRINGE) IMPLANT
TOWEL GREEN STERILE FF (TOWEL DISPOSABLE) ×3 IMPLANT
TRAY DSU PREP LF (CUSTOM PROCEDURE TRAY) IMPLANT
TUBE CONNECTING 20'X1/4 (TUBING) ×1
TUBE CONNECTING 20X1/4 (TUBING) ×2 IMPLANT
UNDERPAD 30X36 HEAVY ABSORB (UNDERPADS AND DIAPERS) ×3 IMPLANT

## 2019-11-14 NOTE — Anesthesia Postprocedure Evaluation (Signed)
Anesthesia Post Note  Patient: Marletta Bousquet  Procedure(s) Performed: Left Fifth ray amputation (Left Foot)     Patient location during evaluation: PACU Anesthesia Type: Regional Level of consciousness: awake and alert Pain management: pain level controlled Vital Signs Assessment: post-procedure vital signs reviewed and stable Respiratory status: spontaneous breathing, nonlabored ventilation and respiratory function stable Cardiovascular status: blood pressure returned to baseline and stable Postop Assessment: no apparent nausea or vomiting Anesthetic complications: no   No complications documented.  Last Vitals:  Vitals:   11/14/19 1045 11/14/19 1100  BP: (!) 147/56 (!) 162/60  Pulse: 69 68  Resp: 18 12  Temp:    SpO2: 99% 100%    Last Pain:  Vitals:   11/14/19 1100  TempSrc:   PainSc: 0-No pain    LLE Motor Response: No movement due to regional block (11/14/19 1100) LLE Sensation: Decreased (regional block) (11/14/19 1100)          Audry Pili

## 2019-11-14 NOTE — H&P (Signed)
Lauren Craig is an 74 y.o. female.   Chief Complaint: Left foot osteomyelitis HPI: The patient is a 74 year old female with a past medical history significant for diabetes complicated by previous fifth toe amputation.  Recently she was hospitalized for sepsis related to left foot cellulitis.  She presented to my office with radiographic findings of osteomyelitis at her fifth metatarsal.  She presents now for fifth ray amputation in an attempt to eradicate remaining occult osteomyelitis.  Past Medical History:  Diagnosis Date  . Arthritis    hands, hips  . Essential (primary) hypertension 1990  . Mixed hyperlipidemia 2010  . Osteomyelitis ([place])    left 5th toe  . Seasonal allergies   . 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 ABDOMINAL AORTOGRAM W/LOWER EXTREMITY N/A 08/11/2017   Procedure: ABDOMINAL AORTOGRAM W/LOWER EXTREMITY;  Surgeon: Angelia Mould, MD;  Location: [facility];  Service: Cardiovascular;  Laterality: N/A;  . ABDOMINAL HYSTERECTOMY    . AMPUTATION TOE Left 05/17/2018   Procedure: 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;  Surgeon: Wylene Simmer, MD;  Location: [facility];  Service: Orthopedics;  Laterality: Left;  . PARTIAL HYMENECTOMY  1968  . TONSILLECTOMY AND ADENOIDECTOMY    . VAGINAL HYSTERECTOMY  1987    Family History  Problem Relation Age of Onset  . Diabetes Mellitus II Mother   . Hypertension Father   . Heart Problems Brother        pacemaker  . Diabetes Mellitus II Brother   . Bipolar disorder Brother    Social History:  reports that she quit smoking about 17 years ago. Her smoking use included cigarettes. She has never used smokeless tobacco. She reports current alcohol use of about 1.0 standard drink of alcohol per week. She reports that she does not use drugs.  Allergies:  Allergies  Allergen Reactions  . Penicillins Rash and Other (See Comments)    Has  patient had a PCN reaction causing immediate rash, facial/tongue/throat swelling, SOB or lightheadedness with hypotension: Yes Has patient had a PCN reaction causing severe rash involving mucus membranes or skin necrosis: No Has patient had a PCN reaction that required hospitalization: Yes - MD office Has patient had a PCN reaction occurring within the last 10 years: No If all of the above answers are "NO", then may proceed with Cephalosporin use.  Other reaction(s): Other (See Comments), Rash Has patient had a PCN reaction causing immediate rash, facial/tongue/throat swelling, SOB or lightheadedness with hypotension: Yes Has patient had a PCN reaction causing severe rash involving mucus membranes or skin necrosis: No Has patient had a PCN reaction that required hospitalization: Yes - MD office Has patient had a PCN reaction occurring within the last 10 years: No If all of the above answers are "NO", then may proceed with Cephalosporin use.  . Humalog Mix 50-50  [Insulin Lispro Prot & Lispro]   . Humalog [Insulin Lispro] Itching and Other (See Comments)    Site of injection was inflamed, red, painful episode  . Semaglutide Nausea And Vomiting  . Metformin Diarrhea    Medications Prior to Admission  Medication Sig Dispense Refill  . albuterol (PROVENTIL HFA;VENTOLIN HFA) 108 (90 Base) MCG/ACT inhaler Inhale 2 puffs into the lungs every 6 (six) hours as needed for wheezing or shortness of breath.    Marland Kitchen atorvastatin (LIPITOR) 80 MG tablet Take 80 mg by mouth at bedtime.    Marland Kitchen  empagliflozin (JARDIANCE) 10 MG TABS tablet Take 10 mg by mouth daily.    . enalapril (VASOTEC) 10 MG tablet Take 10 mg by mouth daily.    Marland Kitchen gabapentin (NEURONTIN) 100 MG capsule Take 100 mg by mouth 2 (two) times daily.    . Insulin Glargine (LANTUS SOLOSTAR) 100 UNIT/ML Solostar Pen Inject 40 Units into the skin at bedtime.     . meloxicam (MOBIC) 15 MG tablet Take 15 mg by mouth at bedtime.    . traMADol (ULTRAM) 50 MG  tablet Take 1 tablet (50 mg total) by mouth every 6 (six) hours as needed. 25 tablet 0  . atorvastatin (LIPITOR) 40 MG tablet Take 40 mg by mouth daily.    . ciprofloxacin (CIPRO) 250 MG tablet TAKE THREE TABLETS (750MG ) BY MOUTH TWICE DAILY FOR 12 DAYS.    Marland Kitchen meloxicam (MOBIC) 15 MG tablet Take 15 mg by mouth.    . mupirocin ointment (BACTROBAN) 2 % mupirocin 2 % topical ointment    . triamcinolone ointment (KENALOG) 0.1 % APPLY TO AFFECTED AREAS TWICE DAILY AS NEEDED.      Results for orders placed or performed during the hospital encounter of 2019-11-17 (from the past 48 hour(s))  Glucose, capillary     Status: Abnormal   Collection Time: 11-17-2019  8:18 AM  Result Value Ref Range   Glucose-Capillary 194 (H) 70 - 99 mg/dL    Comment: Glucose reference range applies only to samples taken after fasting for at least 8 hours.  Glucose, capillary     Status: Abnormal   Collection Time: 17-Nov-2019  8:56 AM  Result Value Ref Range   Glucose-Capillary 189 (H) 70 - 99 mg/dL    Comment: Glucose reference range applies only to samples taken after fasting for at least 8 hours.   No results found.  Review of Systems no recent fever, chills, nausea, vomiting or changes in her appetite  Blood pressure (!) 183/56, pulse 71, temperature 97.7 F (36.5 C), temperature source Oral, resp. rate (!) 8, height 5' 5.5" (1.664 m), weight 85.1 kg, SpO2 100 %. Physical Exam  Well-nourished well-developed female no apparent distress.  Alert and oriented x4.  Normal mood and affect.  Extraocular motions are intact.  Respirations are unlabored.  Gait is normal.  Left foot has a previous fifth toe amputation that is well-healed.  Pulses are palpable.  Diminished sensibility to light touch in the left foot distally.  Active plantar flexion and dorsiflexion strength at the ankle and remaining 4 toes.   Assessment/Plan Left fifth metatarsal osteomyelitis -to the operating room today for fifth ray amputation.  The risks and  benefits of the alternative treatment options have been discussed in detail.  The patient wishes to proceed with surgery and specifically understands risks of bleeding, infection, nerve damage, blood clots, need for additional surgery, amputation and death.   Wylene Simmer, MD 11/17/2019, 9:41 AM

## 2019-11-14 NOTE — Transfer of Care (Signed)
Immediate Anesthesia Transfer of Care Note  Patient: Lauren Craig  Procedure(s) Performed: Left Fifth ray amputation (Left Foot)  Patient Location: PACU  Anesthesia Type:MAC combined with regional for post-op pain  Level of Consciousness: awake, alert  and oriented  Airway & Oxygen Therapy: Patient Spontanous Breathing and Patient connected to nasal cannula oxygen  Post-op Assessment: Report given to RN and Post -op Vital signs reviewed and stable  Post vital signs: Reviewed and stable  Last Vitals:  Vitals Value Taken Time  BP 124/75 11/14/19 1032  Temp    Pulse 69 11/14/19 1033  Resp 12 11/14/19 1033  SpO2 97 % 11/14/19 1033  Vitals shown include unvalidated device data.  Last Pain:  Vitals:   11/14/19 0919  TempSrc:   PainSc: 0-No pain         Complications: No complications documented.

## 2019-11-14 NOTE — Anesthesia Preprocedure Evaluation (Addendum)
Anesthesia Evaluation  Patient identified by MRN, date of birth, ID band Patient awake    Reviewed: Allergy & Precautions, NPO status , Patient's Chart, lab work & pertinent test results  History of Anesthesia Complications Negative for: history of anesthetic complications  Airway Mallampati: II  TM Distance: >3 FB Neck ROM: Full    Dental  (+) Upper Dentures, Partial Lower, Dental Advisory Given   Pulmonary former smoker,    Pulmonary exam normal        Cardiovascular hypertension, Pt. on medications Normal cardiovascular exam     Neuro/Psych negative neurological ROS  negative psych ROS   GI/Hepatic negative GI ROS, Neg liver ROS,   Endo/Other  diabetes, Poorly Controlled, Type 2, Insulin Dependent Obesity   Renal/GU negative Renal ROS     Musculoskeletal  (+) Arthritis ,   Abdominal   Peds  Hematology negative hematology ROS (+)   Anesthesia Other Findings Covid test negative   Reproductive/Obstetrics                            Anesthesia Physical Anesthesia Plan  ASA: III  Anesthesia Plan: Regional and MAC   Post-op Pain Management:    Induction: Intravenous  PONV Risk Score and Plan: 2 and Propofol infusion and Treatment may vary due to age or medical condition  Airway Management Planned: Natural Airway and Simple Face Mask  Additional Equipment: None  Intra-op Plan:   Post-operative Plan:   Informed Consent: I have reviewed the patients History and Physical, chart, labs and discussed the procedure including the risks, benefits and alternatives for the proposed anesthesia with the patient or authorized representative who has indicated his/her understanding and acceptance.       Plan Discussed with: CRNA and Anesthesiologist  Anesthesia Plan Comments:        Anesthesia Quick Evaluation

## 2019-11-14 NOTE — Op Note (Signed)
11/14/2019  10:40 AM  PATIENT:  Lauren Craig  74 y.o. female  PRE-OPERATIVE DIAGNOSIS:  Left 5th metatarsal fracture/osteomyelitis  POST-OPERATIVE DIAGNOSIS:  Left 5th metatarsal fracture/osteomyelitis  Procedure(s): Left Fifth ray amputation  SURGEON:  Wylene Simmer, MD  ASSISTANT: Mechele Claude, PA-C  ANESTHESIA:   MAC, regional  EBL:  minimal   TOURNIQUET:   Total Tourniquet Time Documented: Calf (Left) - 8 minutes Total: Calf (Left) - 8 minutes  COMPLICATIONS:  None apparent  DISPOSITION:  Extubated, awake and stable to recovery.  INDICATION FOR PROCEDURE: The patient is a 74 year old female with a past medical history significant for diabetes.  She has a history of fifth toe amputation for diabetic ulcer.  Recently she was hospitalized with sepsis related to a left foot ulcer and cellulitis.  Radiographs reveal osteomyelitis of the fifth metatarsal.  She presents now for left fifth ray amputation in an effort to eradicate her chronic osteomyelitis.  The risks and benefits of the alternative treatment options have been discussed in detail.  The patient wishes to proceed with surgery and specifically understands risks of bleeding, infection, nerve damage, blood clots, need for additional surgery, amputation and death.  PROCEDURE IN DETAIL:  After pre operative consent was obtained, and the correct operative site was identified, the patient was brought to the operating room and placed supine on the OR table.  Anesthesia was administered.  Pre-operative antibiotics were held.  A surgical timeout was taken.  The left lower extremity was prepped and draped in standard sterile fashion.  The foot was exsanguinated and a 4 inch Esmarch tourniquet wrapped around the ankle.  A longitudinal incision was made along the fifth ray.  Dissection was carried down through the subcutaneous tissues to the level of the fifth metatarsal.  Subperiosteal dissection was then carried proximally to the  junction of the middle and proximal thirds.  The distal portion of the bone was dissected free from the surrounding soft tissues in subperiosteal fashion.  The oscillating saw was used to cut through the metatarsal beveling the cut appropriately.  The distal two thirds of the bone was removed.  Deep tissue was harvested adjacent to the area of fracture and osteomyelitis.  This was sent as a specimen to microbiology for aerobic and anaerobic culture.  Perioperative antibiotics were then administered.  The tourniquet was released and hemostasis was achieved.  The wound was irrigated copiously.  Vancomycin powder was sprinkled in the wound.  The deep soft tissues were approximated with simple sutures of 3-0 Monocryl.  The skin incision was closed with horizontal mattress sutures of 3-0 nylon.  Sterile dressings were applied followed by a compression wrap.  The patient was awakened from anesthesia and transported to the recovery room in stable condition.  FOLLOW UP PLAN: Weightbearing as tolerated on the heel in a flat postop shoe.  Follow-up in the office in 2 weeks for suture removal and a wound check.    Mechele Claude PA-C was present and scrubbed for the duration of the operative case. His assistance was essential in positioning the patient, prepping and draping, gaining and maintaining exposure, performing the operation, closing and dressing the wounds and applying the splint.

## 2019-11-14 NOTE — Progress Notes (Signed)
Assisted Dr. Fransisco Beau with left, ultrasound guided, popliteal block. Side rails up, monitors on throughout procedure. See vital signs in flow sheet. Tolerated Procedure well.

## 2019-11-14 NOTE — Anesthesia Procedure Notes (Signed)
Anesthesia Regional Block: Popliteal block   Pre-Anesthetic Checklist: ,, timeout performed, Correct Patient, Correct Site, Correct Laterality, Correct Procedure, Correct Position, site marked, Risks and benefits discussed,  Surgical consent,  Pre-op evaluation,  At surgeon's request and post-op pain management  Laterality: Left  Prep: chloraprep       Needles:  Injection technique: Single-shot  Needle Type: Echogenic Needle     Needle Length: 10cm  Needle Gauge: 21     Additional Needles:   Narrative:  Start time: 11/14/2019 9:07 AM End time: 11/14/2019 9:11 AM Injection made incrementally with aspirations every 5 mL.  Performed by: Personally  Anesthesiologist: Audry Pili, MD  Additional Notes: No pain on injection. No increased resistance to injection. Injection made in 5cc increments. Good needle visualization. Patient tolerated the procedure well.

## 2019-11-14 NOTE — Discharge Instructions (Addendum)
Wylene Simmer, MD [facility]  Please read the following information regarding your care after surgery.  Medications  You only need a prescription for the narcotic pain medicine (ex. oxycodone, Percocet, Norco).  All of the other medicines listed below are available over the counter. X Resume Meloxicam that you have at home as prescribed for the first 3 days after surgery. X acetominophen (Tylenol) 650 mg every 4-6 hours as you need for minor to moderate pain X Resume tramadol that you have at home as prescribed for severe pain  Weight Bearing X Bear weight only on your operated foot in the post-op shoe.   Cast / Splint / Dressing X Keep your splint, cast or dressing clean and dry.  Don't put anything (coat hanger, pencil, etc) down inside of it.  If it gets damp, use a hair dryer on the cool setting to dry it.  If it gets soaked, call the office to schedule an appointment for a cast change.   After your dressing, cast or splint is removed; you may shower, but do not soak or scrub the wound.  Allow the water to run over it, and then gently pat it dry.  Swelling It is normal for you to have swelling where you had surgery.  To reduce swelling and pain, keep your toes above your nose for at least 3 days after surgery.  It may be necessary to keep your foot or leg elevated for several weeks.  If it hurts, it should be elevated.  Follow Up Call my office at [phone] when you are discharged from the hospital or surgery center to schedule an appointment to be seen two weeks after surgery.  Call my office at [phone] if you develop a fever >101.5 F, nausea, vomiting, bleeding from the surgical site or severe pain.      Post Anesthesia Home Care Instructions  Activity: Get plenty of rest for the remainder of the day. A responsible individual must stay with you for 24 hours following the procedure.  For the next 24 hours, DO NOT: -Drive a car -Paediatric nurse -Drink alcoholic  beverages -Take any medication unless instructed by your physician -Make any legal decisions or sign important papers.  Meals: Start with liquid foods such as gelatin or soup. Progress to regular foods as tolerated. Avoid greasy, spicy, heavy foods. If nausea and/or vomiting occur, drink only clear liquids until the nausea and/or vomiting subsides. Call your physician if vomiting continues.  Special Instructions/Symptoms: Your throat may feel dry or sore from the anesthesia or the breathing tube placed in your throat during surgery. If this causes discomfort, gargle with warm salt water. The discomfort should disappear within 24 hours.      Regional Anesthesia Blocks  1. Numbness or the inability to move the "blocked" extremity may last from 3-48 hours after placement. The length of time depends on the medication injected and your individual response to the medication. If the numbness is not going away after 48 hours, call your surgeon.  2. The extremity that is blocked will need to be protected until the numbness is gone and the  Strength has returned. Because you cannot feel it, you will need to take extra care to avoid injury. Because it may be weak, you may have difficulty moving it or using it. You may not know what position it is in without looking at it while the block is in effect.  3. For blocks in the legs and feet, returning to weight bearing and  walking needs to be done carefully. You will need to wait until the numbness is entirely gone and the strength has returned. You should be able to move your leg and foot normally before you try and bear weight or walk. You will need someone to be with you when you first try to ensure you do not fall and possibly risk injury.  4. Bruising and tenderness at the needle site are common side effects and will resolve in a few days.  5. Persistent numbness or new problems with movement should be communicated to the surgeon or the [place] [phone] [place] [phone]).

## 2019-11-15 ENCOUNTER — Encounter (HOSPITAL_BASED_OUTPATIENT_CLINIC_OR_DEPARTMENT_OTHER): Payer: Self-pay | Admitting: Orthopedic Surgery

## 2019-11-19 DIAGNOSIS — N289 Disorder of kidney and ureter, unspecified: Secondary | ICD-10-CM | POA: Diagnosis not present

## 2019-11-19 DIAGNOSIS — R1013 Epigastric pain: Secondary | ICD-10-CM | POA: Diagnosis not present

## 2019-11-19 DIAGNOSIS — R1011 Right upper quadrant pain: Secondary | ICD-10-CM | POA: Diagnosis not present

## 2019-11-19 LAB — AEROBIC/ANAEROBIC CULTURE W GRAM STAIN (SURGICAL/DEEP WOUND): Culture: NO GROWTH

## 2019-11-27 DIAGNOSIS — R1011 Right upper quadrant pain: Secondary | ICD-10-CM | POA: Diagnosis not present

## 2019-11-27 DIAGNOSIS — R1013 Epigastric pain: Secondary | ICD-10-CM | POA: Diagnosis not present

## 2019-12-18 DIAGNOSIS — E7849 Other hyperlipidemia: Secondary | ICD-10-CM | POA: Diagnosis not present

## 2019-12-18 DIAGNOSIS — H9223 Otorrhagia, bilateral: Secondary | ICD-10-CM | POA: Diagnosis not present

## 2019-12-18 DIAGNOSIS — E1165 Type 2 diabetes mellitus with hyperglycemia: Secondary | ICD-10-CM | POA: Diagnosis not present

## 2019-12-18 DIAGNOSIS — R1011 Right upper quadrant pain: Secondary | ICD-10-CM | POA: Diagnosis not present

## 2020-01-23 DIAGNOSIS — K801 Calculus of gallbladder with chronic 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: Secondary | ICD-10-CM | POA: Insufficient documentation

## 2020-01-31 DIAGNOSIS — L219 Seborrheic dermatitis, unspecified: Secondary | ICD-10-CM | POA: Diagnosis not present

## 2020-01-31 DIAGNOSIS — Z1159 Encounter for screening for other viral diseases: Secondary | ICD-10-CM | POA: Diagnosis not present

## 2020-01-31 DIAGNOSIS — L719 Rosacea, unspecified: Secondary | ICD-10-CM | POA: Diagnosis not present

## 2020-02-07 DIAGNOSIS — Z20828 Contact with and (suspected) exposure to other viral communicable diseases: Secondary | ICD-10-CM | POA: Diagnosis not present

## 2020-02-14 DIAGNOSIS — Z8719 Personal history of other diseases of the digestive system: Secondary | ICD-10-CM | POA: Diagnosis not present

## 2020-02-14 DIAGNOSIS — Z7982 Long term (current) use of aspirin: Secondary | ICD-10-CM | POA: Diagnosis not present

## 2020-02-14 DIAGNOSIS — E119 Type 2 diabetes mellitus without complications: Secondary | ICD-10-CM | POA: Diagnosis not present

## 2020-02-14 DIAGNOSIS — I10 Essential (primary) hypertension: Secondary | ICD-10-CM | POA: Diagnosis not present

## 2020-02-14 DIAGNOSIS — K801 Calculus of gallbladder with chronic 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: Secondary | ICD-10-CM | POA: Diagnosis not present

## 2020-02-14 DIAGNOSIS — E669 Obesity, unspecified: Secondary | ICD-10-CM | POA: Diagnosis not present

## 2020-02-14 DIAGNOSIS — Z881 Allergy status to other antibiotic agents status: Secondary | ICD-10-CM | POA: Diagnosis not present

## 2020-02-14 DIAGNOSIS — Z79899 Other long term (current) drug therapy: Secondary | ICD-10-CM | POA: Diagnosis not present

## 2020-02-14 DIAGNOSIS — Z9109 Other allergy status, other than to drugs and biological substances: Secondary | ICD-10-CM | POA: Diagnosis not present

## 2020-02-14 DIAGNOSIS — M1990 Unspecified osteoarthritis, unspecified site: Secondary | ICD-10-CM | POA: Diagnosis not present

## 2020-02-14 DIAGNOSIS — F419 Anxiety disorder, unspecified: Secondary | ICD-10-CM | POA: Diagnosis not present

## 2020-02-14 DIAGNOSIS — K802 Calculus of gallbladder without 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: Secondary | ICD-10-CM | POA: Diagnosis not present

## 2020-02-14 DIAGNOSIS — Z87891 Personal history of nicotine dependence: Secondary | ICD-10-CM | POA: Diagnosis not present

## 2020-02-14 DIAGNOSIS — Z794 Long term (current) use of insulin: Secondary | ICD-10-CM | POA: Diagnosis not present

## 2020-02-14 DIAGNOSIS — K811 Chronic cholecystitis: Secondary | ICD-10-CM | POA: Diagnosis not present

## 2020-02-14 DIAGNOSIS — E7800 Pure hypercholesterolemia, unspecified: Secondary | ICD-10-CM | POA: Diagnosis not present

## 2020-02-20 DIAGNOSIS — Z09 Encounter for follow-up examination after completed treatment for conditions other than malignant neoplasm: Secondary | ICD-10-CM | POA: Insufficient documentation

## 2020-03-18 DIAGNOSIS — H9203 Otalgia, bilateral: Secondary | ICD-10-CM | POA: Diagnosis not present

## 2020-03-18 DIAGNOSIS — Z23 Encounter for immunization: Secondary | ICD-10-CM | POA: Diagnosis not present

## 2020-03-18 DIAGNOSIS — I10 Essential (primary) hypertension: Secondary | ICD-10-CM | POA: Diagnosis not present

## 2020-05-14 DIAGNOSIS — E1169 Type 2 diabetes mellitus with other specified complication: Secondary | ICD-10-CM | POA: Diagnosis not present

## 2020-05-14 DIAGNOSIS — I10 Essential (primary) hypertension: Secondary | ICD-10-CM | POA: Diagnosis not present

## 2020-05-14 DIAGNOSIS — R609 Edema, unspecified: Secondary | ICD-10-CM | POA: Diagnosis not present

## 2020-07-13 DIAGNOSIS — I10 Essential (primary) hypertension: Secondary | ICD-10-CM | POA: Diagnosis not present

## 2020-07-13 DIAGNOSIS — H539 Unspecified visual disturbance: Secondary | ICD-10-CM | POA: Diagnosis not present

## 2020-07-13 DIAGNOSIS — J189 Pneumonia, unspecified organism: Secondary | ICD-10-CM | POA: Diagnosis not present

## 2020-07-13 DIAGNOSIS — M47814 Spondylosis without myelopathy or radiculopathy, thoracic region: Secondary | ICD-10-CM | POA: Diagnosis not present

## 2020-07-13 DIAGNOSIS — I6350 Cerebral infarction due to unspecified occlusion or stenosis of unspecified cerebral artery: Secondary | ICD-10-CM | POA: Diagnosis not present

## 2020-07-13 DIAGNOSIS — N179 Acute kidney failure, unspecified: Secondary | ICD-10-CM | POA: Diagnosis not present

## 2020-07-13 DIAGNOSIS — R112 Nausea with vomiting, unspecified: Secondary | ICD-10-CM | POA: Diagnosis not present

## 2020-07-13 DIAGNOSIS — E119 Type 2 diabetes mellitus without complications: Secondary | ICD-10-CM | POA: Diagnosis not present

## 2020-07-13 DIAGNOSIS — I6389 Other cerebral infarction: Secondary | ICD-10-CM | POA: Diagnosis not present

## 2020-07-13 DIAGNOSIS — Z79899 Other long term (current) drug therapy: Secondary | ICD-10-CM | POA: Diagnosis not present

## 2020-07-13 DIAGNOSIS — I6782 Cerebral ischemia: Secondary | ICD-10-CM | POA: Diagnosis not present

## 2020-07-13 DIAGNOSIS — E7800 Pure hypercholesterolemia, unspecified: Secondary | ICD-10-CM | POA: Diagnosis not present

## 2020-07-13 DIAGNOSIS — Z794 Long term (current) use of insulin: Secondary | ICD-10-CM | POA: Diagnosis not present

## 2020-07-13 DIAGNOSIS — H53462 Homonymous bilateral field defects, left side: Secondary | ICD-10-CM | POA: Diagnosis not present

## 2020-07-13 DIAGNOSIS — I160 Hypertensive urgency: Secondary | ICD-10-CM | POA: Diagnosis not present

## 2020-07-13 DIAGNOSIS — J9811 Atelectasis: Secondary | ICD-10-CM | POA: Diagnosis not present

## 2020-07-13 DIAGNOSIS — F419 Anxiety disorder, unspecified: Secondary | ICD-10-CM | POA: Diagnosis not present

## 2020-07-13 DIAGNOSIS — R29702 NIHSS score 2: Secondary | ICD-10-CM | POA: Diagnosis not present

## 2020-07-13 DIAGNOSIS — R079 Chest pain, unspecified: Secondary | ICD-10-CM | POA: Diagnosis not present

## 2020-07-13 DIAGNOSIS — R197 Diarrhea, unspecified: Secondary | ICD-10-CM | POA: Diagnosis not present

## 2020-07-13 DIAGNOSIS — R531 Weakness: Secondary | ICD-10-CM | POA: Diagnosis not present

## 2020-07-13 DIAGNOSIS — Z880 Allergy status to penicillin: Secondary | ICD-10-CM | POA: Diagnosis not present

## 2020-07-13 DIAGNOSIS — R0602 Shortness of breath: Secondary | ICD-10-CM | POA: Diagnosis not present

## 2020-07-13 DIAGNOSIS — K449 Diaphragmatic hernia without obstruction or gangrene: Secondary | ICD-10-CM | POA: Diagnosis not present

## 2020-07-13 DIAGNOSIS — Z888 Allergy status to other drugs, medicaments and biological substances status: Secondary | ICD-10-CM | POA: Diagnosis not present

## 2020-07-13 DIAGNOSIS — M1990 Unspecified osteoarthritis, unspecified site: Secondary | ICD-10-CM | POA: Diagnosis not present

## 2020-07-13 DIAGNOSIS — I6529 Occlusion and stenosis of unspecified carotid artery: Secondary | ICD-10-CM | POA: Diagnosis not present

## 2020-07-13 DIAGNOSIS — I2510 Atherosclerotic heart disease of native coronary artery without angina pectoris: Secondary | ICD-10-CM | POA: Diagnosis not present

## 2020-07-14 DIAGNOSIS — I6350 Cerebral infarction due to unspecified occlusion or stenosis of unspecified cerebral artery: Secondary | ICD-10-CM | POA: Diagnosis not present

## 2020-07-14 DIAGNOSIS — K449 Diaphragmatic hernia without obstruction or gangrene: Secondary | ICD-10-CM | POA: Diagnosis not present

## 2020-07-14 DIAGNOSIS — R197 Diarrhea, unspecified: Secondary | ICD-10-CM | POA: Diagnosis not present

## 2020-07-14 DIAGNOSIS — I2510 Atherosclerotic heart disease of native coronary artery without angina pectoris: Secondary | ICD-10-CM | POA: Diagnosis not present

## 2020-07-14 DIAGNOSIS — H539 Unspecified visual disturbance: Secondary | ICD-10-CM | POA: Diagnosis not present

## 2020-07-14 DIAGNOSIS — J189 Pneumonia, unspecified organism: Secondary | ICD-10-CM | POA: Diagnosis not present

## 2020-07-14 DIAGNOSIS — J9811 Atelectasis: Secondary | ICD-10-CM | POA: Diagnosis not present

## 2020-07-14 DIAGNOSIS — N179 Acute kidney failure, unspecified: Secondary | ICD-10-CM | POA: Diagnosis not present

## 2020-07-14 DIAGNOSIS — M47814 Spondylosis without myelopathy or radiculopathy, thoracic region: Secondary | ICD-10-CM | POA: Diagnosis not present

## 2020-07-15 DIAGNOSIS — N179 Acute kidney failure, unspecified: Secondary | ICD-10-CM | POA: Diagnosis not present

## 2020-07-15 DIAGNOSIS — I6350 Cerebral infarction due to unspecified occlusion or stenosis of unspecified cerebral artery: Secondary | ICD-10-CM | POA: Diagnosis not present

## 2020-07-15 DIAGNOSIS — J189 Pneumonia, unspecified organism: Secondary | ICD-10-CM | POA: Diagnosis not present

## 2020-08-12 DIAGNOSIS — E1165 Type 2 diabetes mellitus with hyperglycemia: Secondary | ICD-10-CM | POA: Diagnosis not present

## 2020-08-12 DIAGNOSIS — E1169 Type 2 diabetes mellitus with other specified complication: Secondary | ICD-10-CM | POA: Diagnosis not present

## 2020-08-12 DIAGNOSIS — R1110 Vomiting, unspecified: Secondary | ICD-10-CM | POA: Diagnosis not present

## 2020-08-12 DIAGNOSIS — I10 Essential (primary) hypertension: Secondary | ICD-10-CM | POA: Diagnosis not present

## 2020-08-12 DIAGNOSIS — E7849 Other hyperlipidemia: Secondary | ICD-10-CM | POA: Diagnosis not present

## 2020-08-12 DIAGNOSIS — J690 Pneumonitis due to inhalation of food and vomit: Secondary | ICD-10-CM | POA: Diagnosis not present

## 2020-08-12 DIAGNOSIS — R519 Headache, unspecified: Secondary | ICD-10-CM | POA: Diagnosis not present

## 2020-09-17 DIAGNOSIS — I672 Cerebral atherosclerosis: Secondary | ICD-10-CM | POA: Diagnosis not present

## 2020-09-17 DIAGNOSIS — F32A Depression, unspecified: Secondary | ICD-10-CM | POA: Diagnosis not present

## 2020-09-17 DIAGNOSIS — R29702 NIHSS score 2: Secondary | ICD-10-CM | POA: Diagnosis not present

## 2020-09-17 DIAGNOSIS — R778 Other specified abnormalities of plasma proteins: Secondary | ICD-10-CM | POA: Diagnosis not present

## 2020-09-17 DIAGNOSIS — E1122 Type 2 diabetes mellitus with diabetic chronic kidney disease: Secondary | ICD-10-CM | POA: Diagnosis not present

## 2020-09-17 DIAGNOSIS — Z79899 Other long term (current) drug therapy: Secondary | ICD-10-CM | POA: Diagnosis not present

## 2020-09-17 DIAGNOSIS — Z87891 Personal history of nicotine dependence: Secondary | ICD-10-CM | POA: Diagnosis not present

## 2020-09-17 DIAGNOSIS — N179 Acute kidney failure, unspecified: Secondary | ICD-10-CM | POA: Diagnosis not present

## 2020-09-17 DIAGNOSIS — Z794 Long term (current) use of insulin: Secondary | ICD-10-CM | POA: Diagnosis not present

## 2020-09-17 DIAGNOSIS — N281 Cyst of kidney, acquired: Secondary | ICD-10-CM | POA: Diagnosis not present

## 2020-09-17 DIAGNOSIS — N189 Chronic kidney disease, unspecified: Secondary | ICD-10-CM | POA: Diagnosis not present

## 2020-09-17 DIAGNOSIS — Z7901 Long term (current) use of anticoagulants: Secondary | ICD-10-CM | POA: Diagnosis not present

## 2020-09-17 DIAGNOSIS — I6350 Cerebral infarction due to unspecified occlusion or stenosis of unspecified cerebral artery: Secondary | ICD-10-CM | POA: Diagnosis not present

## 2020-09-17 DIAGNOSIS — Z8673 Personal history of transient ischemic attack (TIA), and cerebral infarction without residual deficits: Secondary | ICD-10-CM | POA: Diagnosis not present

## 2020-09-17 DIAGNOSIS — Z888 Allergy status to other drugs, medicaments and biological substances status: Secondary | ICD-10-CM | POA: Diagnosis not present

## 2020-09-17 DIAGNOSIS — E785 Hyperlipidemia, unspecified: Secondary | ICD-10-CM | POA: Diagnosis not present

## 2020-09-17 DIAGNOSIS — N184 Chronic kidney disease, stage 4 (severe): Secondary | ICD-10-CM | POA: Diagnosis not present

## 2020-09-17 DIAGNOSIS — Z880 Allergy status to penicillin: Secondary | ICD-10-CM | POA: Diagnosis not present

## 2020-09-17 DIAGNOSIS — R29810 Facial weakness: Secondary | ICD-10-CM | POA: Diagnosis not present

## 2020-09-17 DIAGNOSIS — F419 Anxiety disorder, unspecified: Secondary | ICD-10-CM | POA: Diagnosis not present

## 2020-09-17 DIAGNOSIS — I63233 Cerebral infarction due to unspecified occlusion or stenosis of bilateral carotid arteries: Secondary | ICD-10-CM | POA: Diagnosis not present

## 2020-09-17 DIAGNOSIS — Z7982 Long term (current) use of aspirin: Secondary | ICD-10-CM | POA: Diagnosis not present

## 2020-09-17 DIAGNOSIS — I6389 Other cerebral infarction: Secondary | ICD-10-CM

## 2020-09-17 DIAGNOSIS — M1990 Unspecified osteoarthritis, unspecified site: Secondary | ICD-10-CM | POA: Diagnosis not present

## 2020-09-17 DIAGNOSIS — R29704 NIHSS score 4: Secondary | ICD-10-CM | POA: Diagnosis not present

## 2020-09-17 DIAGNOSIS — K224 Dyskinesia of esophagus: Secondary | ICD-10-CM | POA: Diagnosis not present

## 2020-09-17 DIAGNOSIS — E7800 Pure hypercholesterolemia, unspecified: Secondary | ICD-10-CM | POA: Diagnosis not present

## 2020-09-17 DIAGNOSIS — R4781 Slurred speech: Secondary | ICD-10-CM | POA: Diagnosis not present

## 2020-09-17 DIAGNOSIS — I129 Hypertensive chronic kidney disease with stage 1 through stage 4 chronic kidney disease, or unspecified chronic kidney disease: Secondary | ICD-10-CM | POA: Diagnosis not present

## 2020-09-17 DIAGNOSIS — R29818 Other symptoms and signs involving the nervous system: Secondary | ICD-10-CM | POA: Diagnosis not present

## 2020-09-18 DIAGNOSIS — I6389 Other cerebral infarction: Secondary | ICD-10-CM | POA: Diagnosis not present

## 2020-10-27 DIAGNOSIS — R112 Nausea with vomiting, unspecified: Secondary | ICD-10-CM | POA: Diagnosis not present

## 2020-10-28 ENCOUNTER — Encounter (HOSPITAL_COMMUNITY): Payer: Self-pay | Admitting: Family Medicine

## 2020-10-28 ENCOUNTER — Other Ambulatory Visit: Payer: Self-pay

## 2020-10-28 ENCOUNTER — Inpatient Hospital Stay (HOSPITAL_COMMUNITY)
Admission: EM | Admit: 2020-10-28 | Discharge: 2020-11-01 | DRG: 392 | Disposition: A | Discharge status: Home / Self Care | Payer: PPO | Attending: Family Medicine | Admitting: Family Medicine

## 2020-10-28 ENCOUNTER — Emergency Department (HOSPITAL_COMMUNITY): Payer: PPO

## 2020-10-28 DIAGNOSIS — K2100 Gastro-esophageal reflux disease with esophagitis, without bleeding: Principal | ICD-10-CM | POA: Diagnosis present

## 2020-10-28 DIAGNOSIS — K429 Umbilical hernia without obstruction or gangrene: Secondary | ICD-10-CM | POA: Diagnosis not present

## 2020-10-28 DIAGNOSIS — L03116 Cellulitis of left lower limb: Secondary | ICD-10-CM

## 2020-10-28 DIAGNOSIS — E875 Hyperkalemia: Secondary | ICD-10-CM | POA: Diagnosis not present

## 2020-10-28 DIAGNOSIS — K2980 Duodenitis without bleeding: Secondary | ICD-10-CM

## 2020-10-28 DIAGNOSIS — I129 Hypertensive chronic kidney disease with stage 1 through stage 4 chronic kidney disease, or unspecified chronic kidney disease: Secondary | ICD-10-CM | POA: Diagnosis present

## 2020-10-28 DIAGNOSIS — R197 Diarrhea, unspecified: Secondary | ICD-10-CM | POA: Diagnosis not present

## 2020-10-28 DIAGNOSIS — R935 Abnormal findings on diagnostic imaging of other abdominal regions, including retroperitoneum: Secondary | ICD-10-CM | POA: Diagnosis not present

## 2020-10-28 DIAGNOSIS — E860 Dehydration: Secondary | ICD-10-CM | POA: Diagnosis not present

## 2020-10-28 DIAGNOSIS — Z8673 Personal history of transient ischemic attack (TIA), and cerebral infarction without residual deficits: Secondary | ICD-10-CM

## 2020-10-28 DIAGNOSIS — E782 Mixed hyperlipidemia: Secondary | ICD-10-CM | POA: Diagnosis not present

## 2020-10-28 DIAGNOSIS — Z7984 Long term (current) use of oral hypoglycemic drugs: Secondary | ICD-10-CM | POA: Diagnosis not present

## 2020-10-28 DIAGNOSIS — J302 Other seasonal allergic rhinitis: Secondary | ICD-10-CM | POA: Diagnosis not present

## 2020-10-28 DIAGNOSIS — Z8249 Family history of ischemic heart disease and other diseases of the circulatory system: Secondary | ICD-10-CM | POA: Diagnosis not present

## 2020-10-28 DIAGNOSIS — F419 Anxiety disorder, unspecified: Secondary | ICD-10-CM | POA: Diagnosis present

## 2020-10-28 DIAGNOSIS — K5730 Diverticulosis of large intestine without perforation or abscess without bleeding: Secondary | ICD-10-CM | POA: Diagnosis not present

## 2020-10-28 DIAGNOSIS — Z818 Family history of other mental and behavioral disorders: Secondary | ICD-10-CM | POA: Diagnosis not present

## 2020-10-28 DIAGNOSIS — E1165 Type 2 diabetes mellitus with hyperglycemia: Secondary | ICD-10-CM | POA: Diagnosis not present

## 2020-10-28 DIAGNOSIS — R778 Other specified abnormalities of plasma proteins: Secondary | ICD-10-CM | POA: Diagnosis not present

## 2020-10-28 DIAGNOSIS — I159 Secondary hypertension, unspecified: Secondary | ICD-10-CM | POA: Diagnosis not present

## 2020-10-28 DIAGNOSIS — Z9049 Acquired absence of other specified parts of digestive tract: Secondary | ICD-10-CM | POA: Diagnosis not present

## 2020-10-28 DIAGNOSIS — R1010 Upper abdominal pain, unspecified: Secondary | ICD-10-CM | POA: Diagnosis not present

## 2020-10-28 DIAGNOSIS — E1122 Type 2 diabetes mellitus with diabetic chronic kidney disease: Secondary | ICD-10-CM | POA: Diagnosis present

## 2020-10-28 DIAGNOSIS — Z79899 Other long term (current) drug therapy: Secondary | ICD-10-CM

## 2020-10-28 DIAGNOSIS — Z794 Long term (current) use of insulin: Secondary | ICD-10-CM

## 2020-10-28 DIAGNOSIS — K449 Diaphragmatic hernia without obstruction or gangrene: Secondary | ICD-10-CM | POA: Diagnosis not present

## 2020-10-28 DIAGNOSIS — E1121 Type 2 diabetes mellitus with diabetic nephropathy: Secondary | ICD-10-CM | POA: Diagnosis not present

## 2020-10-28 DIAGNOSIS — K2950 Unspecified chronic gastritis without bleeding: Secondary | ICD-10-CM | POA: Diagnosis not present

## 2020-10-28 DIAGNOSIS — R1110 Vomiting, unspecified: Secondary | ICD-10-CM | POA: Diagnosis present

## 2020-10-28 DIAGNOSIS — Z888 Allergy status to other drugs, medicaments and biological substances status: Secondary | ICD-10-CM

## 2020-10-28 DIAGNOSIS — I10 Essential (primary) hypertension: Secondary | ICD-10-CM | POA: Diagnosis not present

## 2020-10-28 DIAGNOSIS — Z20822 Contact with and (suspected) exposure to covid-19: Secondary | ICD-10-CM | POA: Diagnosis present

## 2020-10-28 DIAGNOSIS — Z89422 Acquired absence of other left toe(s): Secondary | ICD-10-CM

## 2020-10-28 DIAGNOSIS — N179 Acute kidney failure, unspecified: Secondary | ICD-10-CM | POA: Diagnosis present

## 2020-10-28 DIAGNOSIS — E1169 Type 2 diabetes mellitus with other specified complication: Secondary | ICD-10-CM | POA: Diagnosis present

## 2020-10-28 DIAGNOSIS — Z880 Allergy status to penicillin: Secondary | ICD-10-CM

## 2020-10-28 DIAGNOSIS — R109 Unspecified abdominal pain: Secondary | ICD-10-CM | POA: Diagnosis not present

## 2020-10-28 DIAGNOSIS — R112 Nausea with vomiting, unspecified: Secondary | ICD-10-CM | POA: Diagnosis not present

## 2020-10-28 DIAGNOSIS — Z90710 Acquired absence of both cervix and uterus: Secondary | ICD-10-CM | POA: Diagnosis not present

## 2020-10-28 DIAGNOSIS — Z87891 Personal history of nicotine dependence: Secondary | ICD-10-CM | POA: Diagnosis not present

## 2020-10-28 DIAGNOSIS — K2990 Gastroduodenitis, unspecified, without bleeding: Secondary | ICD-10-CM | POA: Diagnosis not present

## 2020-10-28 DIAGNOSIS — R739 Hyperglycemia, unspecified: Secondary | ICD-10-CM | POA: Diagnosis present

## 2020-10-28 DIAGNOSIS — Z833 Family history of diabetes mellitus: Secondary | ICD-10-CM | POA: Diagnosis not present

## 2020-10-28 DIAGNOSIS — K2210 Ulcer of esophagus without bleeding: Secondary | ICD-10-CM

## 2020-10-28 DIAGNOSIS — F339 Major depressive disorder, recurrent, unspecified: Secondary | ICD-10-CM

## 2020-10-28 DIAGNOSIS — N189 Chronic kidney disease, unspecified: Secondary | ICD-10-CM | POA: Diagnosis present

## 2020-10-28 DIAGNOSIS — R1111 Vomiting without nausea: Secondary | ICD-10-CM | POA: Diagnosis not present

## 2020-10-28 DIAGNOSIS — Z791 Long term (current) use of non-steroidal anti-inflammatories (NSAID): Secondary | ICD-10-CM

## 2020-10-28 DIAGNOSIS — R531 Weakness: Secondary | ICD-10-CM | POA: Diagnosis present

## 2020-10-28 DIAGNOSIS — E118 Type 2 diabetes mellitus with unspecified complications: Secondary | ICD-10-CM | POA: Diagnosis present

## 2020-10-28 DIAGNOSIS — J309 Allergic rhinitis, unspecified: Secondary | ICD-10-CM | POA: Diagnosis not present

## 2020-10-28 LAB — CBC
HCT: 42.3 % (ref 36.0–46.0)
Hemoglobin: 13.3 g/dL (ref 12.0–15.0)
MCH: 26.1 pg (ref 26.0–34.0)
MCHC: 31.4 g/dL (ref 30.0–36.0)
MCV: 83.1 fL (ref 80.0–100.0)
Platelets: 328 10*3/uL (ref 150–400)
RBC: 5.09 MIL/uL (ref 3.87–5.11)
RDW: 15.9 % — ABNORMAL HIGH (ref 11.5–15.5)
WBC: 12.5 10*3/uL — ABNORMAL HIGH (ref 4.0–10.5)
nRBC: 0 % (ref 0.0–0.2)

## 2020-10-28 LAB — URINALYSIS, ROUTINE W REFLEX MICROSCOPIC
Bilirubin Urine: NEGATIVE
Glucose, UA: 50 mg/dL — AB
Ketones, ur: NEGATIVE mg/dL
Leukocytes,Ua: NEGATIVE
Nitrite: NEGATIVE
Protein, ur: 100 mg/dL — AB
Specific Gravity, Urine: 1.013 (ref 1.005–1.030)
pH: 5 (ref 5.0–8.0)

## 2020-10-28 LAB — COMPREHENSIVE METABOLIC PANEL
ALT: 10 U/L (ref 0–44)
AST: 10 U/L — ABNORMAL LOW (ref 15–41)
Albumin: 3.3 g/dL — ABNORMAL LOW (ref 3.5–5.0)
Alkaline Phosphatase: 77 U/L (ref 38–126)
Anion gap: 10 (ref 5–15)
BUN: 54 mg/dL — ABNORMAL HIGH (ref 8–23)
CO2: 18 mmol/L — ABNORMAL LOW (ref 22–32)
Calcium: 9.5 mg/dL (ref 8.9–10.3)
Chloride: 106 mmol/L (ref 98–111)
Creatinine, Ser: 2.43 mg/dL — ABNORMAL HIGH (ref 0.44–1.00)
GFR, Estimated: 20 mL/min — ABNORMAL LOW (ref >60)
Glucose, Bld: 132 mg/dL — ABNORMAL HIGH (ref 70–99)
Potassium: 5.3 mmol/L — ABNORMAL HIGH (ref 3.5–5.1)
Sodium: 134 mmol/L — ABNORMAL LOW (ref 135–145)
Total Bilirubin: 0.7 mg/dL (ref 0.3–1.2)
Total Protein: 6.9 g/dL (ref 6.5–8.1)

## 2020-10-28 LAB — CBG MONITORING, ED: Glucose-Capillary: 127 mg/dL — ABNORMAL HIGH (ref 70–99)

## 2020-10-28 LAB — RESP PANEL BY RT-PCR (FLU A&B, COVID) ARPGX2
Influenza A by PCR: NEGATIVE
Influenza B by PCR: NEGATIVE
SARS Coronavirus 2 by RT PCR: NEGATIVE

## 2020-10-28 LAB — TROPONIN I (HIGH SENSITIVITY)
Troponin I (High Sensitivity): 26 ng/L — ABNORMAL HIGH (ref <18)
Troponin I (High Sensitivity): 16 ng/L (ref <18)

## 2020-10-28 LAB — GLUCOSE, CAPILLARY: Glucose-Capillary: 116 mg/dL — ABNORMAL HIGH (ref 70–99)

## 2020-10-28 LAB — LIPASE, BLOOD: Lipase: 36 U/L (ref 11–51)

## 2020-10-28 IMAGING — CT CT ABD-PELV W/O CM
2 of 4 series · 15 of 46 positions shown, 17 images · non-contrast
Comparison: Ultrasound [DATE].

CLINICAL DATA: Abdominal pain, nausea, vomiting

EXAM:
CT ABDOMEN AND PELVIS WITHOUT CONTRAST
TECHNIQUE: Multidetector CT imaging of the abdomen and pelvis was performed
following the standard protocol without IV contrast.

[Series 3: a/p w/o 5mm · axial · non-contrast · 0.84mm/px · z∈[+890,+1345]mm · 12 of 101 slices shown, 14 images]
[im 5/101  soft-tissue]
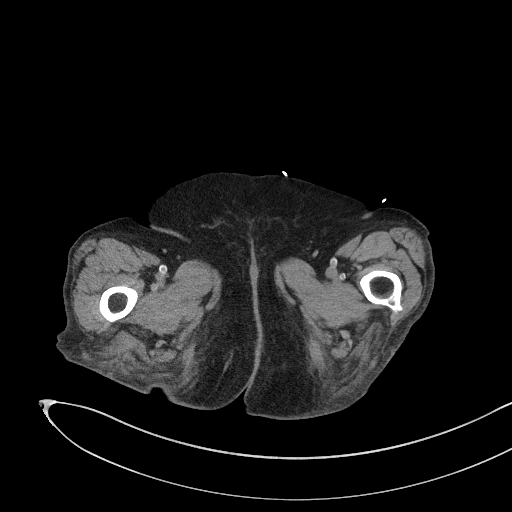
[im 5/101  bone]
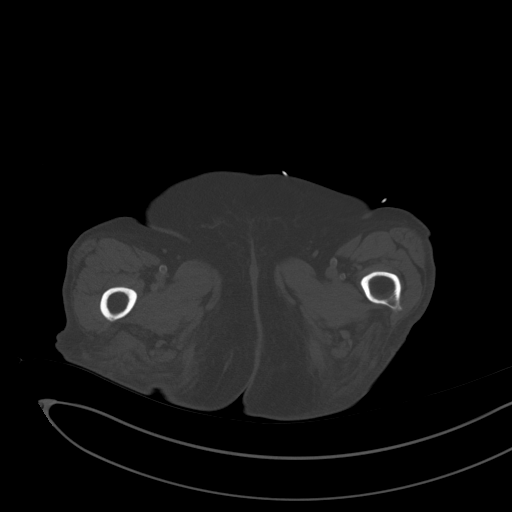
[im 13/101  soft-tissue]
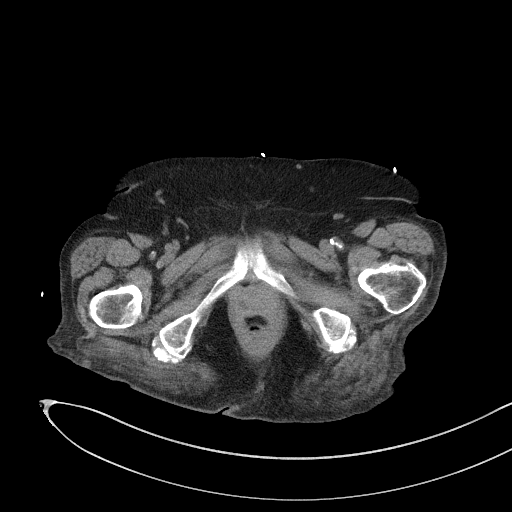
[im 21/101  soft-tissue]
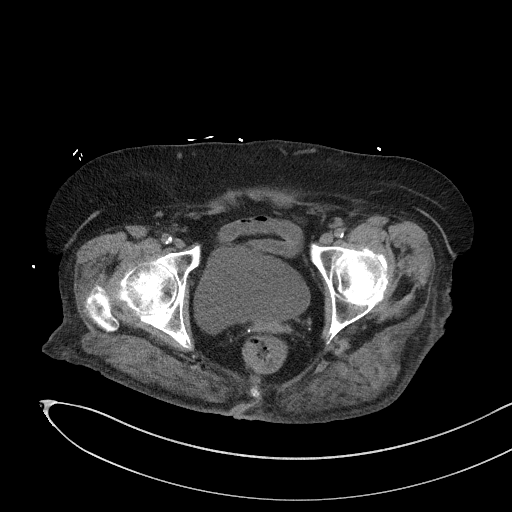
[im 30/101  soft-tissue]
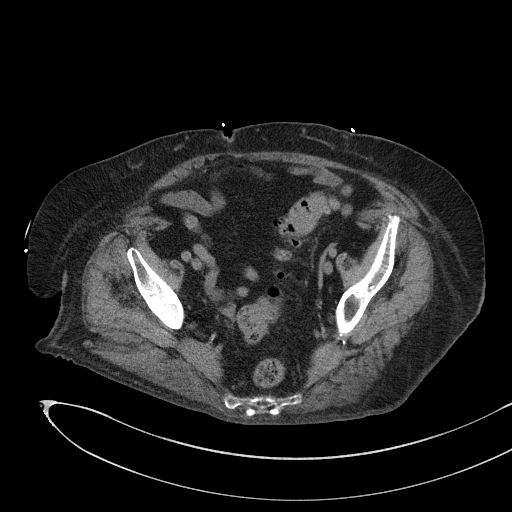
[im 38/101  soft-tissue]
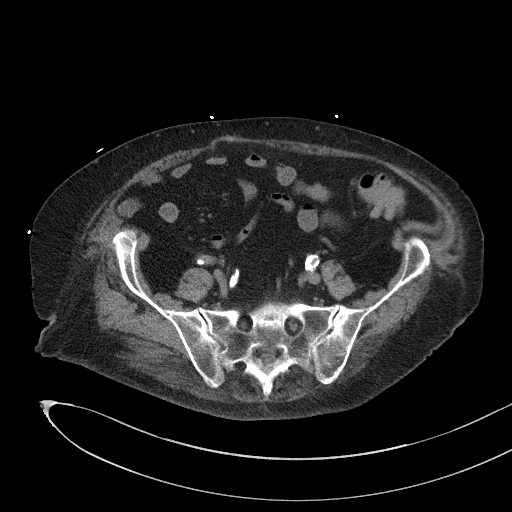
[im 46/101  soft-tissue]
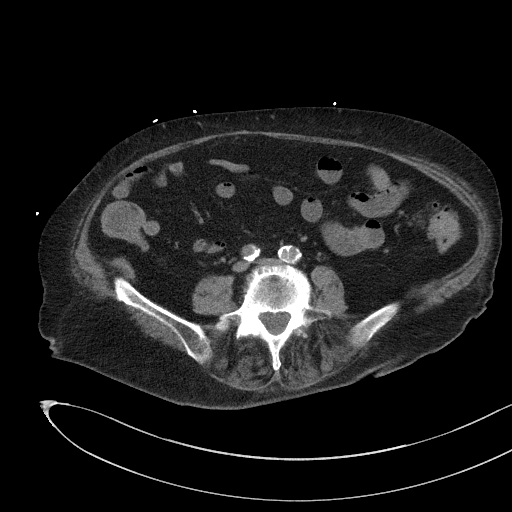
[im 55/101  soft-tissue]
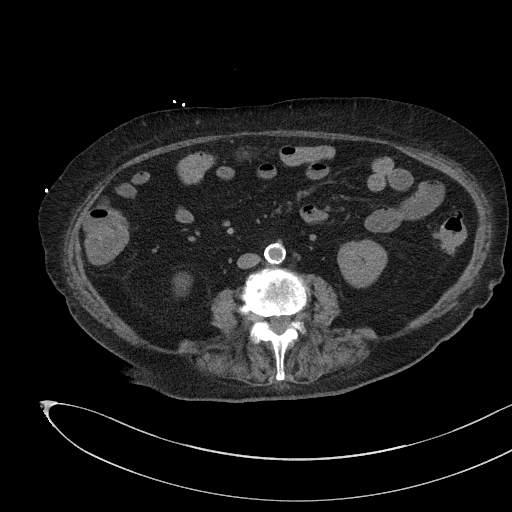
[im 63/101  soft-tissue]
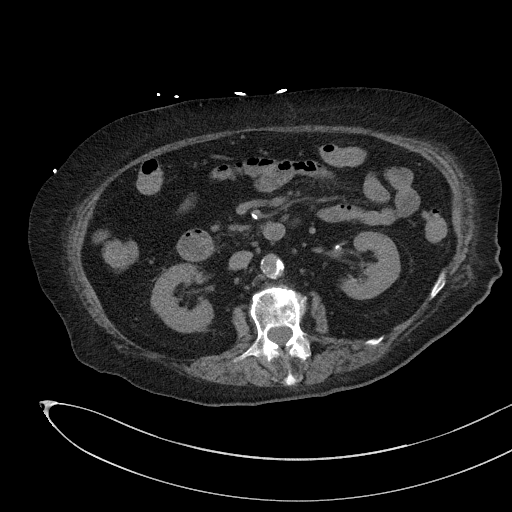
[im 71/101  soft-tissue]
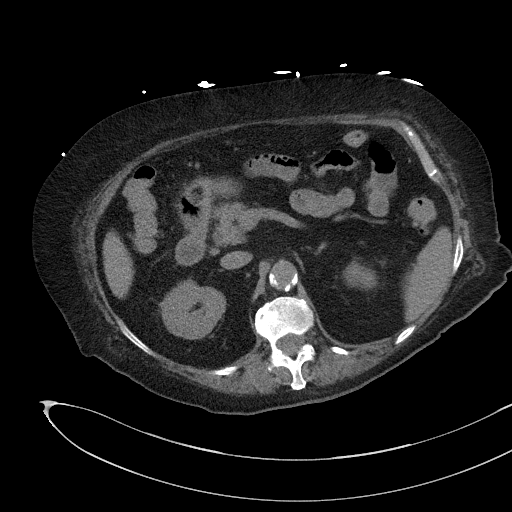
[im 71/101  bone]
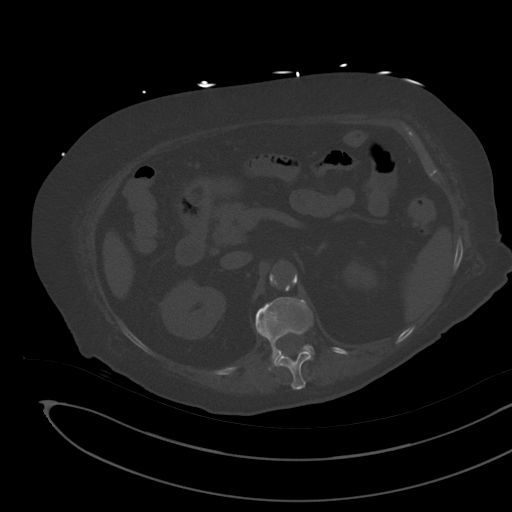
[im 80/101  soft-tissue]
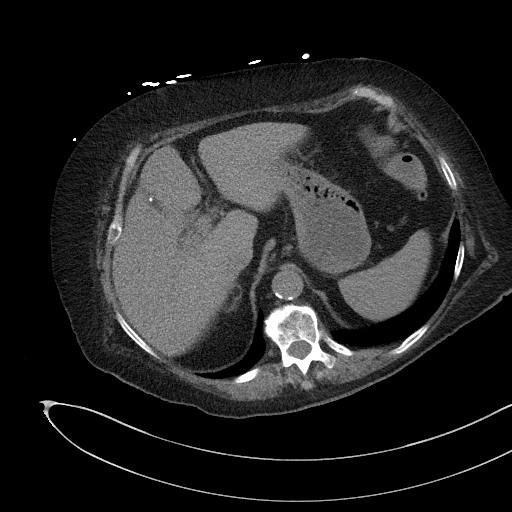
[im 88/101  soft-tissue]
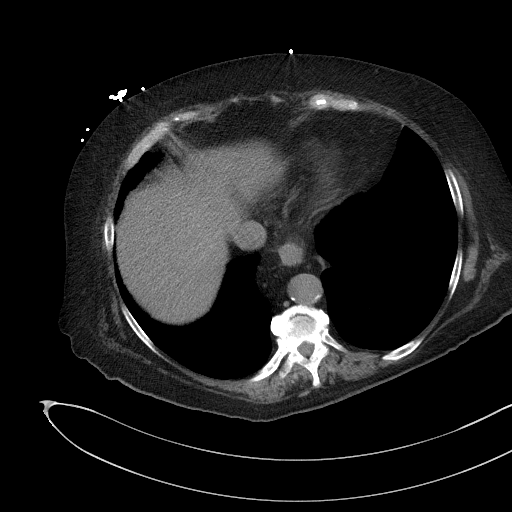
[im 96/101  soft-tissue]
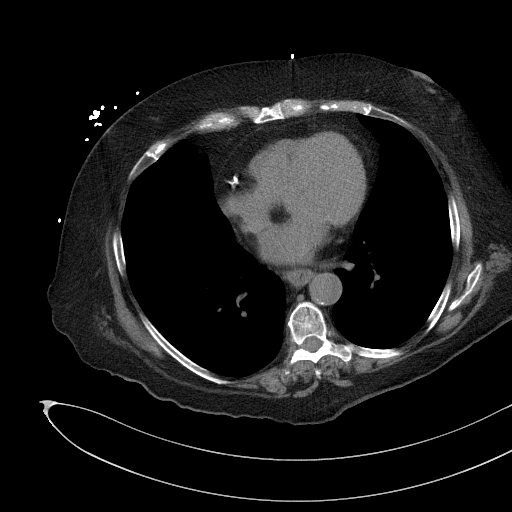

[Series 6: a/p w/o cor · coronal · non-contrast · 0.91mm/px · 3 of 151 slices shown]
[im 51/151  soft-tissue]
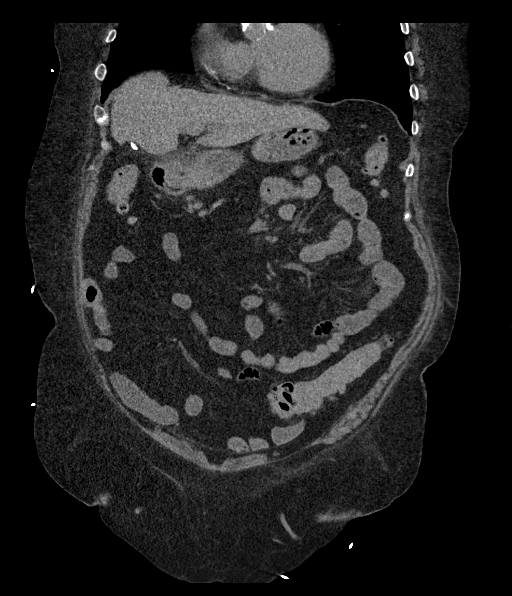
[im 67/151  soft-tissue]
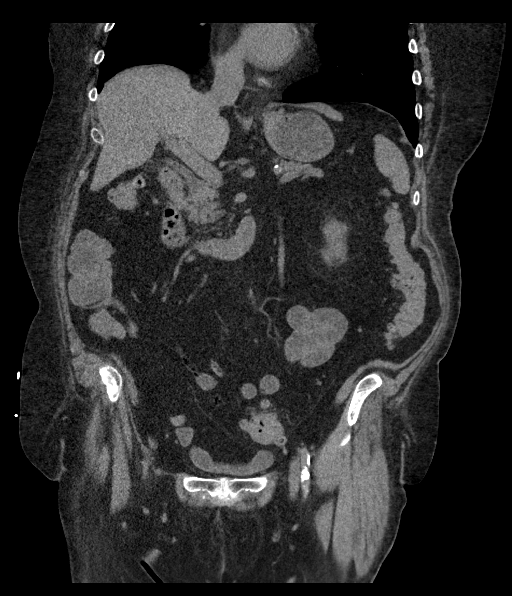
[im 84/151  soft-tissue]
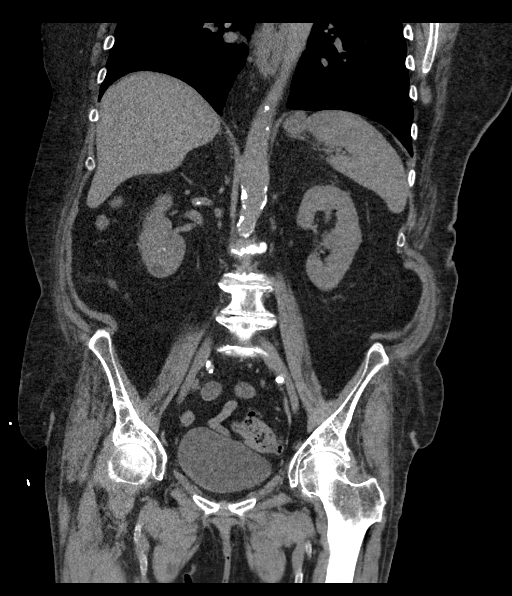

[15 of 46 positions shown; findings below may reference images not displayed]

FINDINGS: Lower chest: Included lung bases are clear. Heart size is normal.
Coronary artery calcification is present.

Hepatobiliary: No focal liver abnormality is evident on noncontrast
study. Status post cholecystectomy. No biliary dilatation.

Pancreas: Unremarkable. No pancreatic ductal dilatation or
surrounding inflammatory changes.

Spleen: Normal in size without focal abnormality.

Adrenals/Urinary Tract: Unremarkable adrenal glands. There are a few
tiny calcified densities within the bilateral renal hila, likely
reflecting vascular calcifications. Tiny nonobstructing calculi not
excluded. 1.4 cm low-density lesion at the lower pole of the right
kidney, recently described as a cyst by ultrasound. Kidneys are
otherwise within normal limits. No hydronephrosis. Ureters and
urinary bladder are unremarkable.

Stomach/Bowel: Circumferential thickening of the distal esophagus
(series 6, images 81-85). Small sliding type hiatal hernia. Subtle
fat stranding in the region of the duodenal bulb (series 6, image
52). Stomach appears within normal limits. No dilated loops of small
bowel. The tip of the appendix is mildly dilated measuring 11 mm in
diameter with subtle periappendiceal fat stranding (series 3, images
49-50). No adjacent fluid, extraluminal air, or abscess. Extensive
diverticulosis most pronounced within the sigmoid and descending
colon. No focally inflamed diverticula or pericolonic inflammatory
change.

Vascular/Lymphatic: Aortic atherosclerosis. No enlarged abdominal or
pelvic lymph nodes.

Reproductive: Status post hysterectomy. No adnexal masses.

Other: No free fluid. No abdominopelvic fluid collection. No
pneumoperitoneum. Small fat containing umbilical hernia.

Musculoskeletal: No acute or significant osseous findings.
Multilevel degenerative disc disease of the lumbar spine.
IMPRESSION: 1. The tip of the appendix is mildly dilated with subtle
periappendiceal fat stranding. Findings are concerning for early
acute uncomplicated tip appendicitis.
2. Subtle fat stranding in the region of the duodenal bulb, which
may represent duodenitis or peptic ulcer disease.
3. Circumferential thickening of the distal esophagus, suggesting
esophagitis. Correlation with nonemergent endoscopy is recommended.
4. Small sliding type hiatal hernia.
5. Extensive colonic diverticulosis without evidence of acute
diverticulitis.
6. Aortic atherosclerosis ([CU]-[CU]).

## 2020-10-28 MED ORDER — ONDANSETRON HCL 4 MG/2ML IJ SOLN
4.0000 mg | Freq: Once | INTRAMUSCULAR | Status: AC
  Administered 2020-10-28: 4 mg via INTRAVENOUS
  Filled 2020-10-28: qty 2

## 2020-10-28 MED ORDER — SODIUM CHLORIDE 0.9 % IV BOLUS
500.0000 mL | Freq: Once | INTRAVENOUS | Status: AC
  Administered 2020-10-28: 500 mL via INTRAVENOUS

## 2020-10-28 MED ORDER — LABETALOL HCL 5 MG/ML IV SOLN
10.0000 mg | Freq: Once | INTRAVENOUS | Status: AC
  Administered 2020-10-28: 10 mg via INTRAVENOUS
  Filled 2020-10-28: qty 4

## 2020-10-28 MED ORDER — PANTOPRAZOLE SODIUM 40 MG IV SOLR
40.0000 mg | Freq: Once | INTRAVENOUS | Status: AC
  Administered 2020-10-28: 40 mg via INTRAVENOUS
  Filled 2020-10-28: qty 40

## 2020-10-28 MED ORDER — PANTOPRAZOLE SODIUM 40 MG IV SOLR
40.0000 mg | Freq: Two times a day (BID) | INTRAVENOUS | Status: DC
  Administered 2020-10-28 – 2020-10-31 (×6): 40 mg via INTRAVENOUS
  Filled 2020-10-28 (×6): qty 40

## 2020-10-28 MED ORDER — LACTATED RINGERS IV SOLN: INTRAVENOUS | Status: DC

## 2020-10-28 MED ORDER — HYDRALAZINE HCL 20 MG/ML IJ SOLN
5.0000 mg | Freq: Three times a day (TID) | INTRAMUSCULAR | Status: DC
  Administered 2020-10-28 – 2020-10-29 (×3): 5 mg via INTRAVENOUS
  Filled 2020-10-28 (×3): qty 1

## 2020-10-28 MED ORDER — ONDANSETRON HCL 4 MG/2ML IJ SOLN: 4.0000 mg | Freq: Four times a day (QID) | INTRAMUSCULAR | Status: DC | PRN

## 2020-10-28 MED ORDER — INSULIN ASPART 100 UNIT/ML IJ SOLN
0.0000 [IU] | Freq: Three times a day (TID) | INTRAMUSCULAR | Status: DC
  Administered 2020-10-29: 1 [IU] via SUBCUTANEOUS

## 2020-10-28 NOTE — Consult Note (Signed)
Consult Note  Lauren Craig [birth year]  UQ:[id number].    Requesting MD: Lorenza Cambridge PA-C Chief Complaint/Reason for Consult: abdominal pain - possible appendicitis on CT  HPI:  Patient is a 75 year old female who presented to [facility] after being seen at an urgent care in [place] yesterday with abdominal pain and intractable nausea and vomiting for the last 6 days. Patient reports pain in upper abdomen that is intermittent and vomiting everything she tried to eat. Reports bilious emesis as well, denies coffee-ground or bloody emesis. She also started having diarrhea in the last few days, denies dark or bloody stools. She reports pain is worsened by eating and has been occurring for the last year on and off. She has had her gallbladder removed by a surgeon in [place] as a possible source of abdominal pain and is still having symptoms. She has been referred to gastroenterology but has not been seen yet. She has lost about 40 lbs over the last year although she reports some of this was intentional. She denies fever, chills, chest pain, SOB, urinary symptoms. PMH otherwise significant for HTN and T2DM. Past abdominal surgery otherwise includes abdominal hysterectomy. Patient formerly smoked but stopped 15 years ago. She denies alcohol or illicit drug use. She is not on any chronic steroids.   ROS: Review of Systems  Constitutional: Positive for weight loss. Negative for chills and fever.  Respiratory: Negative for shortness of breath and wheezing.   Cardiovascular: Negative for chest pain and palpitations.  Gastrointestinal: Positive for abdominal pain, diarrhea, nausea and vomiting. Negative for blood in stool, constipation and melena.  Genitourinary: Negative for dysuria, frequency and urgency.  All other systems reviewed and are negative.   Family History  Problem Relation Age of Onset  . Diabetes Mellitus II Mother   . Hypertension Father   . Heart Problems Brother        pacemaker   . Diabetes Mellitus II Brother   . Bipolar disorder Brother     Past Medical History:  Diagnosis Date  . Arthritis    hands, hips  . Essential (primary) hypertension 1990  . Mixed hyperlipidemia 2010  . 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 Seasonal allergies   . 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 ABDOMINAL AORTOGRAM W/LOWER EXTREMITY N/A 08/11/2017   Procedure: ABDOMINAL AORTOGRAM W/LOWER EXTREMITY;  Surgeon: Angelia Mould, MD;  Location: [facility];  Service: Cardiovascular;  Laterality: N/A;  . ABDOMINAL HYSTERECTOMY    . AMPUTATION Left 11/14/2019   Procedure: Left Fifth ray amputation;  Surgeon: Wylene Simmer, MD;  Location: [facility];  Service: Orthopedics;  Laterality: Left;  . AMPUTATION TOE Left 05/17/2018   Procedure: 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;  Surgeon: Wylene Simmer, MD;  Location: [facility];  Service: Orthopedics;  Laterality: Left;  . PARTIAL HYMENECTOMY  1968  . TONSILLECTOMY AND ADENOIDECTOMY    . VAGINAL HYSTERECTOMY  1987    Social History:  reports that she quit smoking about 18 years ago. Her smoking use included cigarettes. She has never used smokeless tobacco. She reports current alcohol use of about 1.0 standard drink of alcohol per week. She reports that she does not use drugs.  Allergies:  Allergies  Allergen Reactions  . Penicillins Rash and Other (See Comments)    Has patient had a PCN reaction causing immediate rash, facial/tongue/throat  swelling, SOB or lightheadedness with hypotension: Yes Has patient had a PCN reaction causing severe rash involving mucus membranes or skin necrosis: No Has patient had a PCN reaction that required hospitalization: Yes - MD office Has patient had a PCN reaction occurring within the last 10 years: No If all of the above answers are "NO", then may proceed with Cephalosporin use.  Other  reaction(s): Other (See Comments), Rash Has patient had a PCN reaction causing immediate rash, facial/tongue/throat swelling, SOB or lightheadedness with hypotension: Yes Has patient had a PCN reaction causing severe rash involving mucus membranes or skin necrosis: No Has patient had a PCN reaction that required hospitalization: Yes - MD office Has patient had a PCN reaction occurring within the last 10 years: No If all of the above answers are "NO", then may proceed with Cephalosporin use.  . Humalog Mix 50-50  [Insulin Lispro Prot & Lispro]   . Humalog [Insulin Lispro] Itching and Other (See Comments)    Site of injection was inflamed, red, painful episode  . Semaglutide Nausea And Vomiting  . Metformin Diarrhea    (Not in a hospital admission)   Blood pressure (!) 180/100, pulse 87, temperature 97.6 F (36.4 C), temperature source Oral, resp. rate (!) 23, SpO2 100 %. Physical Exam:  General: pleasant, WD, overweight female who is laying in bed in NAD HEENT: head is normocephalic, atraumatic.  Sclera are anicteric. Ears and nose without any masses or lesions.  Mouth is pink and moist Heart: regular, rate, and rhythm.  Normal s1,s2. No obvious murmurs, gallops, or rubs noted.  Palpable radial and pedal pulses bilaterally Lungs: CTAB, no wheezes, rhonchi, or rales noted.  Respiratory effort nonlabored Abd: soft, NT, ND, +BS, no masses, hernias, or organomegaly MS: all 4 extremities are symmetrical with no cyanosis, clubbing, or edema. Skin: warm and dry with no masses, lesions, or rashes Neuro: Cranial nerves 2-12 grossly intact, sensation is normal throughout Psych: A&Ox3 with an appropriate affect.   Results for orders placed or performed during the hospital encounter of 10/28/20 (from the past 48 hour(s))  Lipase, blood     Status: None   Collection Time: 10/28/20 11:06 AM  Result Value Ref Range   Lipase 36 11 - 51 U/L    Comment: Performed at [facility], [address], [place], [place] [address]  Comprehensive metabolic panel     Status: Abnormal   Collection Time: 10/28/20 11:06 AM  Result Value Ref Range   Sodium 134 (L) 135 - 145 mmol/L   Potassium 5.3 (H) 3.5 - 5.1 mmol/L   Chloride 106 98 - 111 mmol/L   CO2 18 (L) 22 - 32 mmol/L   Glucose, Bld 132 (H) 70 - 99 mg/dL    Comment: Glucose reference range applies only to samples taken after fasting for at least 8 hours.   BUN 54 (H) 8 - 23 mg/dL   Creatinine, Ser 2.43 (H) 0.44 - 1.00 mg/dL   Calcium 9.5 8.9 - 10.3 mg/dL   Total Protein 6.9 6.5 - 8.1 g/dL   Albumin 3.3 (L) 3.5 - 5.0 g/dL   AST 10 (L) 15 - 41 U/L   ALT 10 0 - 44 U/L   Alkaline Phosphatase 77 38 - 126 U/L   Total Bilirubin 0.7 0.3 - 1.2 mg/dL   GFR, Estimated 20 (L) >60 mL/min    Comment: (NOTE) Calculated using the CKD-EPI Creatinine Equation (2021)    Anion gap 10 5 - 15  Comment: Performed at [facility], [place] [address] [place], [place] [address]  CBC     Status: Abnormal   Collection Time: 10/28/20 11:06 AM  Result Value Ref Range   WBC 12.5 (H) 4.0 - 10.5 K/uL   RBC 5.09 3.87 - 5.11 MIL/uL   Hemoglobin 13.3 12.0 - 15.0 g/dL   HCT 42.3 36.0 - 46.0 %   MCV 83.1 80.0 - 100.0 fL   MCH 26.1 26.0 - 34.0 pg   MCHC 31.4 30.0 - 36.0 g/dL   RDW 15.9 (H) 11.5 - 15.5 %   Platelets 328 150 - 400 K/uL   nRBC 0.0 0.0 - 0.2 %    Comment: Performed at [facility], [place] [address] [place], [place] [address]  Troponin I (High Sensitivity)     Status: Abnormal   Collection Time: 10/28/20 11:06 AM  Result Value Ref Range   Troponin I (High Sensitivity) 26 (H) <18 ng/L    Comment: (NOTE) Elevated high sensitivity troponin I (hsTnI) values and significant  changes across serial measurements may suggest ACS but many other  chronic and acute conditions are known to elevate hsTnI results.  Refer to the "Links" section for chest pain algorithms and additional  guidance. Performed at [facility], [place] [address] [place], [place] [address]   CBG monitoring, ED     Status: Abnormal   Collection Time: 10/28/20 11:06 AM  Result Value Ref Range   Glucose-Capillary 127 (H) 70 - 99 mg/dL    Comment: Glucose reference range applies only to samples taken after fasting for at least 8 hours.   Comment 1 Notify RN    Comment 2 Document in Chart    CT ABDOMEN PELVIS WO CONTRAST  Result Date: 10/28/2020 CLINICAL DATA:  Abdominal pain, nausea, vomiting EXAM: CT ABDOMEN AND PELVIS WITHOUT CONTRAST TECHNIQUE: Multidetector CT imaging of the abdomen and pelvis was performed following the standard protocol without IV contrast. COMPARISON:  Ultrasound 09/18/2020. FINDINGS: Lower chest: Included lung bases are clear. Heart size is normal. Coronary artery calcification is present. Hepatobiliary: No focal liver abnormality is evident on noncontrast study. Status post cholecystectomy. No biliary dilatation. Pancreas: Unremarkable. No pancreatic ductal dilatation or surrounding inflammatory changes. Spleen: Normal in size without focal abnormality. Adrenals/Urinary Tract: Unremarkable adrenal glands. There are a few tiny calcified densities within the bilateral renal hila, likely reflecting vascular calcifications. Tiny nonobstructing calculi not excluded. 1.4 cm low-density lesion at the lower pole of the right kidney, recently described as a cyst by ultrasound. Kidneys are otherwise within normal limits. No hydronephrosis. Ureters and urinary bladder are unremarkable. Stomach/Bowel: Circumferential thickening of the distal esophagus (series 6, images 81-85). Small sliding type hiatal hernia. Subtle fat stranding in the region of the duodenal bulb (series 6, image 52). Stomach appears within normal limits. No dilated loops of small bowel. The tip of the appendix is mildly dilated measuring 11 mm in diameter with subtle periappendiceal fat stranding (series 3, images 49-50). No adjacent fluid, extraluminal air, or abscess. Extensive  diverticulosis most pronounced within the sigmoid and descending colon. No focally inflamed diverticula or pericolonic inflammatory change. Vascular/Lymphatic: Aortic atherosclerosis. No enlarged abdominal or pelvic lymph nodes. Reproductive: Status post hysterectomy. No adnexal masses. Other: No free fluid. No abdominopelvic fluid collection. No pneumoperitoneum. Small fat containing umbilical hernia. Musculoskeletal: No acute or significant osseous findings. Multilevel degenerative disc disease of the lumbar spine. IMPRESSION: 1. The tip of the appendix is mildly dilated with subtle  periappendiceal fat stranding. Findings are concerning for early acute uncomplicated tip appendicitis. 2. Subtle fat stranding in the region of the duodenal bulb, which may represent duodenitis or peptic ulcer disease. 3. Circumferential thickening of the distal esophagus, suggesting esophagitis. Correlation with nonemergent endoscopy is recommended. 4. Small sliding type hiatal hernia. 5. Extensive colonic diverticulosis without evidence of acute diverticulitis. 6. Aortic atherosclerosis (ICD10-I70.0). Electronically Signed   By: Davina Poke D.O.   On: 10/28/2020 14:07      Assessment/Plan HTN T2DM on insulin   Abdominal pain  Intractable nausea and vomiting  Diarrhea - patient with upper abdominal pain and no peritonitis or pain on palpation of abdomen - mild leukocytosis of 12K and afebrile - CT today with mildly dilated appendiceal tip at 11 mm and possible mild stranding; also shows stranding around duodenal bulb with possible duodenitis or peptic ulcer disease, also possible esophagitis  - clinical picture not at all consistent with appendicitis. Duodenitis or peptic ulcer disease vs enteritis should be considered as possible sources of abdominal pain, n/v and diarrhea - recommend medical admission and GI consultation  - no indication for emergency surgical intervention at this time, we will sign off. Please  call us back if any questions or concerns  Norm Parcel, [facility] Surgery 10/28/2020, 2:58 PM Please see Amion for pager number during day hours 7:00am-4:30pm

## 2020-10-28 NOTE — ED Triage Notes (Signed)
Pt reports generalized abdominal pain, n/v since Friday. Went to an UC in [place] who advised to come to ED. Hx of DM2 and has not taken insulin since Friday when she became sick.

## 2020-10-28 NOTE — H&P (Addendum)
[facility] Admission History and Physical Service Pager: [phone]  Patient name: Lauren Craig Medical record number: [id number] Date of birth: [birth year] Age: 75 y.o. Gender: female  Primary Care Provider: Randel Books, FNP Consultants: General surgery Code Status: Full   Preferred Emergency Contact: [name] (daughter) [phone]  Chief Complaint: Vomiting, Abdominal Pain  Assessment and Plan: Lauren Craig is a 75 y.o. female presenting with vomiting and abdominal pain. PMH is significant for HTN, insulin-dependent T2DM, and osteomyelitis of the foot.  Vomiting, Abdominal Pain Patient with persistent vomiting and diarrhea x6 days. Patient reports that these episodes have been recurrent for the last 1.5 years but typically only last 2 to 3 days. Patient has had multiple admissions and has underwent cholecystectomy without improvement of symptoms.  Patient unable to tolerate any type of liquids orally.  She has significant AKI. CT scan in ED suggestive of esophagitis, possible duodenitis, and concern for early appendicitis to dilated tip of appendix with mild stranding.  Patient was evaluated by general surgery for the concern for appendicitis and it was ruled unlikely given clinical picture.  Physical exam is reassuring for minimal tenderness with palpation of the LUQ, afebrile, emesis is nonbloody. Labs are significant for an AKI and elevated white count of 12.5. Clinical picture is more consistent with duodenitis versus PUD versus enteritis.  GI consulted and plans for endoscopy tomorrow. Recommends GI pathogen panel and C. Diff testing. - Admit to [place] service, attending Dr. Owens Shark -General surgery consulted in ED, appreciate recommendations (signed off) -GI following, appreciate recommendations: plan for endoscopy tomorrow  - Vitals per routine - Up with assistance - PT/OT eval and treat -Zofran IV as needed, consider Phenergan as needed if  emesis refractory to Zofran. -- maintenance IVF  --NPO --SCDs -- follow up AM labs  AKI Patient with persistent vomiting and diarrhea with poor oral intake over the last 6 days, though she states she still has been able to urinate though it is more frequent and in smaller amounts.  BMP significant for creatinine of 2.43 on admission, baseline appears about 0.9.  No prior history of CKD.  Likely prerenal etiology in the setting of vomiting and diarrhea with poor p.o. intake.  Patient is status post 2 boluses of 500 cc NS in the ED. - IVF with LR at maintenance of 125 mL/h - BMP in the a.m.  Hyperkalemia Potassium elevated to 5.3, likely in the setting of dehydration.  Patient is unable to tolerate oral medications, considered giving Lokelma to lower but held off given it is an oral medication.  EKG unremarkable and anticipate decrease with IVF, will recheck in the morning. - Recheck BMP in a.m.  Leukocytosis WBC elevated to 12.5 on admission, consideration for infectious etiology such as enteritis, though possible to be demargination given dehydration and significant vomiting and diarrhea. - Continue to monitor with CBC in the a.m.  HTN BP on admission reached a max of 218/74, patient was given labetalol in the ED and blood pressure decreased to 106/81, blood pressure leveled out to 167/76.  Patient reports that her systolic blood pressure was elevated to the 200s if she does not take her home medication, which is enalapril 10 mg twice daily. - Holding home enalapril in setting of AKI  - Hydralazine 5 mg 3 times daily, will titrate up as necessary - Vitals per routine  T2DM Patient reports that her last known A1c was around 7 or 8, but is unsure of exact number.  Per home medications in the chart patient was previously on Jardiance 10 mg but states she is currently only taking Lantus 25 units twice daily. - A1c with morning labs - Sensitive SSI - CBGs every 8 hours while n.p.o., will  change to before every meal and nightly once eating  HLD Home meds: Atorvastatin '80mg'$  daily - Holding atorvastatin 80 mg until able to tolerate p.o.  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  Only persistent deficit is drooping of right side of the mouth.  Patient is not on any anticoagulation or antiplatelet medications.   FEN/GI: NPO Prophylaxis: SCDs  Disposition: Inpatient med-surg  History of Present Illness:  Lauren Craig is a 75 y.o. female presenting with persistent nausea and vomiting.  History provided by daughter and patient.  Daughter reports that for the last 6 days patient has been without food, water, medications due to vomiting.  For about the last year and a half, patient has been dealing with recurrent episodes of persistent vomiting that would typically last 2 to 3 days with episodes of vomiting every 30 minutes to 1 hour.  She is also having watery diarrhea episodes without blood.   Patient reports that she is unsure of her last meal, but notes that she has not eaten since at least 5/27, daughter was concerned that this is the longest episode she has ever had.  Daughter took patient to [facility] in [place] yesterday and was told that she needed to get to the hospital for further evaluation by GI. Daughter feels like these episodes started occurring when dealing with the stress of family issues involving CPS.  Patient is hospitalized 3 [facility] at [facility] and has not been able to get to the root of the issue.  She had a gallbladder surgery in fall 2021 for cholecystitis but did not resolve the symptoms.  Patient also has a history of 4 strokes in the past year, only remaining deficit left is mild drooping of the right side of her mouth. History provided by daughter and patient.    Review Of Systems: Per HPI with the following additions:   Review of Systems  Constitutional: Positive for fatigue. Negative for fever.  HENT:  Positive for sore throat and trouble swallowing.   Eyes:       No blurry vision  Respiratory: Negative for cough and shortness of breath.   Cardiovascular: Negative for chest pain.  Gastrointestinal: Positive for abdominal pain, nausea and vomiting. Negative for blood in stool.  Genitourinary: Negative for decreased urine volume and dysuria.  Musculoskeletal: Negative for back pain and neck pain.  Skin: Negative for rash and wound.  Neurological: Negative for dizziness, weakness and headaches.     Patient Active Problem List   Diagnosis Date Noted  . Vomiting and diarrhea 10/28/2020  . Cellulitis of foot, left 07/12/2019  . Diabetic foot ulcer ([place]) 07/12/2019  . Hyperglycemia 07/12/2019  . Leukocytosis 07/12/2019  . Acute osteomyelitis ([place]) 04/04/2018  . 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 HTN (hypertension) 10/03/2017  . Hyperlipidemia 10/03/2017  . Uncontrolled type 2 diabetes mellitus with hyperglycemia ([place]) 10/03/2017    Past Medical History: Past Medical History:  Diagnosis Date  . Arthritis    hands, hips  . Essential (primary) hypertension 1990  . Mixed hyperlipidemia 2010  . Osteomyelitis ([place])    left 5th toe  . Seasonal allergies   .  Type 2 diabetes mellitus with diabetic cataract ([place]) 2002    Past Surgical History: Past Surgical History:  Procedure Laterality Date  . ABDOMINAL AORTOGRAM W/LOWER EXTREMITY N/A 08/11/2017   Procedure: ABDOMINAL AORTOGRAM W/LOWER EXTREMITY;  Surgeon: Angelia Mould, MD;  Location: [facility];  Service: Cardiovascular;  Laterality: N/A;  . ABDOMINAL HYSTERECTOMY    . AMPUTATION Left 11/14/2019   Procedure: Left Fifth ray amputation;  Surgeon: Wylene Simmer, MD;  Location: [facility];  Service: Orthopedics;  Laterality: Left;  . AMPUTATION TOE Left 05/17/2018   Procedure: 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;  Surgeon: Wylene Simmer, MD;  Location: [facility];  Service: Orthopedics;  Laterality: Left;  . PARTIAL HYMENECTOMY  1968  . TONSILLECTOMY AND ADENOIDECTOMY    . VAGINAL HYSTERECTOMY  1987    Social History: Social History   Tobacco Use  . Smoking status: Former Smoker    Types: Cigarettes    Quit date: 2004    Years since quitting: 18.4  . Smokeless tobacco: Never Used  Vaping Use  . Vaping Use: Never used  Substance Use Topics  . Alcohol use: Yes    Alcohol/week: 1.0 standard drink    Types: 1 Glasses of wine per week    Comment: wine.  occasional  . Drug use: No  Former smoker for 49 about 0.5 ppd (25 pack years).   Family History: Family History  Problem Relation Age of Onset  . Diabetes Mellitus II Mother   . Hypertension Father   . Heart Problems Brother        pacemaker  . Diabetes Mellitus II Brother   . Bipolar disorder Brother     Allergies and Medications: Allergies  Allergen Reactions  . Penicillins Rash and Other (See Comments)    Has patient had a PCN reaction causing immediate rash, facial/tongue/throat swelling, SOB or lightheadedness with hypotension: Yes Has patient had a PCN reaction causing severe rash involving mucus membranes or skin necrosis: No Has patient had a PCN reaction that required hospitalization: Yes - [facility] office Has patient had a PCN reaction occurring within the last 10 years: No If all of the above answers are "NO", then may proceed with Cephalosporin use.  Other reaction(s): Other (See Comments), Rash Has patient had a PCN reaction causing immediate rash, facial/tongue/throat swelling, SOB or lightheadedness with hypotension: Yes Has patient had a PCN reaction causing severe rash involving mucus membranes or skin necrosis: No Has patient had a PCN reaction that required hospitalization: Yes - [facility] office Has patient had a PCN reaction occurring within the last 10 years: No If all of the above answers are "NO", then may proceed with Cephalosporin use.  . Humalog Mix 50-50   [Insulin Lispro Prot & Lispro]   . Humalog [Insulin Lispro] Itching and Other (See Comments)    Site of injection was inflamed, red, painful episode  . Semaglutide Nausea And Vomiting  . Metformin Diarrhea   No current facility-administered medications on file prior to encounter.   Current Outpatient Medications on File Prior to Encounter  Medication Sig Dispense Refill  . albuterol (PROVENTIL HFA;VENTOLIN HFA) 108 (90 Base) MCG/ACT inhaler Inhale 2 puffs into the lungs every 6 (six) hours as needed for wheezing or shortness of breath.    Marland Kitchen atorvastatin (LIPITOR) 40 MG tablet Take 40 mg by mouth daily.    Marland Kitchen atorvastatin (LIPITOR) 80 MG tablet Take 80 mg by mouth at bedtime.    . ciprofloxacin (CIPRO) 250  MG tablet TAKE THREE TABLETS ('750MG'$ ) BY MOUTH TWICE DAILY FOR 12 DAYS.    Marland Kitchen empagliflozin (JARDIANCE) 10 MG TABS tablet Take 10 mg by mouth daily.    . enalapril (VASOTEC) 10 MG tablet Take 10 mg by mouth daily.    Marland Kitchen gabapentin (NEURONTIN) 100 MG capsule Take 100 mg by mouth 2 (two) times daily.    . Insulin Glargine (LANTUS SOLOSTAR) 100 UNIT/ML Solostar Pen Inject 40 Units into the skin at bedtime.     . meloxicam (MOBIC) 15 MG tablet Take 15 mg by mouth at bedtime.    . meloxicam (MOBIC) 15 MG tablet Take 15 mg by mouth.    . mupirocin ointment (BACTROBAN) 2 % mupirocin 2 % topical ointment    . traMADol (ULTRAM) 50 MG tablet Take 1 tablet (50 mg total) by mouth every 6 (six) hours as needed. 25 tablet 0  . triamcinolone ointment (KENALOG) 0.1 % APPLY TO AFFECTED AREAS TWICE DAILY AS NEEDED.      Objective: BP (!) 167/76   Pulse 70   Temp 97.6 F (36.4 C) (Oral)   Resp (!) 22   SpO2 97%  Exam: General -- NAD, appears tired, pleasant and cooperative. Daughter at bedside HEENT -- Head is normocephalic. PERRLA. EOMI. Ears, nose and throat were benign. Dry mucous membranes. Neck -- supple; no bruits. Integument -- intact. No rash, erythema, or ecchymoses.  Chest -- good expansion.  Lungs clear to auscultation. Cardiac -- RRR. No murmurs noted.  Abdomen -- soft, mild tenderness to palpation in LUQ. No masses palpable. Bowel sounds present. Genital, rectal and breast exam -- deferred. CNS -- cranial nerves II through XII grossly intact. A&O x4 Extremeties - no tenderness or effusions noted. ROM good. 5/5 bilateral strength. Dorsalis pedis pulses present and symmetrical.   Labs and Imaging: CBC BMET  Recent Labs  Lab 10/28/20 1106  WBC 12.5*  HGB 13.3  HCT 42.3  PLT 328   Recent Labs  Lab 10/28/20 1106  NA 134*  K 5.3*  CL 106  CO2 18*  BUN 54*  CREATININE 2.43*  GLUCOSE 132*  CALCIUM 9.5     EKG: Sinus rhythm, RAD, no ST elevations, QTc 447  CT ABDOMEN PELVIS WO CONTRAST  Result Date: 10/28/2020 CLINICAL DATA:  Abdominal pain, nausea, vomiting EXAM: CT ABDOMEN AND PELVIS WITHOUT CONTRAST TECHNIQUE: Multidetector CT imaging of the abdomen and pelvis was performed following the standard protocol without IV contrast. COMPARISON:  Ultrasound 09/18/2020. FINDINGS: Lower chest: Included lung bases are clear. Heart size is normal. Coronary artery calcification is present. Hepatobiliary: No focal liver abnormality is evident on noncontrast study. Status post cholecystectomy. No biliary dilatation. Pancreas: Unremarkable. No pancreatic ductal dilatation or surrounding inflammatory changes. Spleen: Normal in size without focal abnormality. Adrenals/Urinary Tract: Unremarkable adrenal glands. There are a few tiny calcified densities within the bilateral renal hila, likely reflecting vascular calcifications. Tiny nonobstructing calculi not excluded. 1.4 cm low-density lesion at the lower pole of the right kidney, recently described as a cyst by ultrasound. Kidneys are otherwise within normal limits. No hydronephrosis. Ureters and urinary bladder are unremarkable. Stomach/Bowel: Circumferential thickening of the distal esophagus (series 6, images 81-85). Small sliding type  hiatal hernia. Subtle fat stranding in the region of the duodenal bulb (series 6, image 52). Stomach appears within normal limits. No dilated loops of small bowel. The tip of the appendix is mildly dilated measuring 11 mm in diameter with subtle periappendiceal fat stranding (series 3, images 49-50). No adjacent  fluid, extraluminal air, or abscess. Extensive diverticulosis most pronounced within the sigmoid and descending colon. No focally inflamed diverticula or pericolonic inflammatory change. Vascular/Lymphatic: Aortic atherosclerosis. No enlarged abdominal or pelvic lymph nodes. Reproductive: Status post hysterectomy. No adnexal masses. Other: No free fluid. No abdominopelvic fluid collection. No pneumoperitoneum. Small fat containing umbilical hernia. Musculoskeletal: No acute or significant osseous findings. Multilevel degenerative disc disease of the lumbar spine. IMPRESSION: 1. The tip of the appendix is mildly dilated with subtle periappendiceal fat stranding. Findings are concerning for early acute uncomplicated tip appendicitis. 2. Subtle fat stranding in the region of the duodenal bulb, which may represent duodenitis or peptic ulcer disease. 3. Circumferential thickening of the distal esophagus, suggesting esophagitis. Correlation with nonemergent endoscopy is recommended. 4. Small sliding type hiatal hernia. 5. Extensive colonic diverticulosis without evidence of acute diverticulitis. 6. Aortic atherosclerosis (ICD10-I70.0). Electronically Signed   By: Davina Poke D.O.   On: 10/28/2020 14:07     Rise Patience, DO 10/28/2020, 5:26 PM PGY-1, [facility] Intern pager: [phone], text pages welcome  FPTS Upper-Level Resident Addendum   I have independently interviewed and examined the patient. I have discussed the above with the original author and agree with their documentation. My edits for correction/addition/clarification are in blue Please see also any attending notes.    Lyndee Hensen, DO PGY-2, [facility] 10/28/2020 7:35 PM  FPTS Service pager: [phone] (text pages welcome through [facility])

## 2020-10-28 NOTE — ED Provider Notes (Signed)
[facility] EMERGENCY DEPARTMENT Provider Note   CSN: [id number] Arrival date & time: 10/28/20  1053     History Chief Complaint  Patient presents with  . Abdominal Pain  . Emesis    Lauren Craig is a 75 y.o. female.  Patient presents to the emergency department today for evaluation of nausea, vomiting, occasional upper abdominal pain.  Patient has a history of insulin-dependent diabetes, foot ulcer with osteomyelitis, hypertension.  She states that since Friday (5 days ago) she has been very sick and unable to eat or drink very much.  She has had upper abdominal pains at times but not currently.  She denies diarrhea or constipation.  Decreased urination but no dysuria, increased frequency or urgency.  No chest pain or shortness of breath.  She has not noticed any fevers.  She went to an urgent care in [place] yesterday.  She has a history of a cholecystectomy, no other abdominal surgeries.  The onset of this condition was acute. The course is constant. Aggravating factors: none. Alleviating factors: none.          Past Medical History:  Diagnosis Date  . Arthritis    hands, hips  . Essential (primary) hypertension 1990  . Mixed hyperlipidemia 2010  . Osteomyelitis ([place])    left 5th toe  . Seasonal allergies   . 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 Cellulitis of foot, left 07/12/2019  . Diabetic foot ulcer ([place]) 07/12/2019  . Hyperglycemia 07/12/2019  . Leukocytosis 07/12/2019  . Acute osteomyelitis ([place]) 04/04/2018  . 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 HTN (hypertension) 10/03/2017  . Hyperlipidemia 10/03/2017  . Uncontrolled type 2 diabetes mellitus with hyperglycemia ([place]) 10/03/2017    Past Surgical History:  Procedure Laterality Date  . ABDOMINAL AORTOGRAM W/LOWER EXTREMITY N/A 08/11/2017   Procedure: ABDOMINAL AORTOGRAM  W/LOWER EXTREMITY;  Surgeon: Angelia Mould, MD;  Location: [facility];  Service: Cardiovascular;  Laterality: N/A;  . ABDOMINAL HYSTERECTOMY    . AMPUTATION Left 11/14/2019   Procedure: Left Fifth ray amputation;  Surgeon: Wylene Simmer, MD;  Location: [facility];  Service: Orthopedics;  Laterality: Left;  . AMPUTATION TOE Left 05/17/2018   Procedure: 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;  Surgeon: Wylene Simmer, MD;  Location: [facility];  Service: Orthopedics;  Laterality: Left;  . PARTIAL HYMENECTOMY  1968  . TONSILLECTOMY AND ADENOIDECTOMY    . VAGINAL HYSTERECTOMY  1987     OB History   No obstetric history on file.     Family History  Problem Relation Age of Onset  . Diabetes Mellitus II Mother   . Hypertension Father   . Heart Problems Brother        pacemaker  . Diabetes Mellitus II Brother   . Bipolar disorder Brother     Social History   Tobacco Use  . Smoking status: Former Smoker    Types: Cigarettes    Quit date: 2004    Years since quitting: 18.4  . Smokeless tobacco: Never Used  Vaping Use  . Vaping Use: Never used  Substance Use Topics  . Alcohol use: Yes    Alcohol/week: 1.0 standard drink    Types: 1 Glasses of wine per week    Comment: wine.  occasional  . Drug use: No    Home Medications Prior to  Admission medications   Medication Sig Start Date End Date Taking? Authorizing Provider  albuterol (PROVENTIL HFA;VENTOLIN HFA) 108 (90 Base) MCG/ACT inhaler Inhale 2 puffs into the lungs every 6 (six) hours as needed for wheezing or shortness of breath.    [provider]  atorvastatin (LIPITOR) 40 MG tablet Take 40 mg by mouth daily.    [provider]  atorvastatin (LIPITOR) 80 MG tablet Take 80 mg by mouth at bedtime. 04/05/19   [provider]  ciprofloxacin (CIPRO) 250 MG tablet TAKE THREE TABLETS ('750MG'$ ) BY MOUTH TWICE DAILY FOR 12 DAYS. 07/03/19   [provider]  empagliflozin  (JARDIANCE) 10 MG TABS tablet Take 10 mg by mouth daily.    [provider]  enalapril (VASOTEC) 10 MG tablet Take 10 mg by mouth daily. 07/25/19   [provider]  gabapentin (NEURONTIN) 100 MG capsule Take 100 mg by mouth 2 (two) times daily.    [provider]  Insulin Glargine (LANTUS SOLOSTAR) 100 UNIT/ML Solostar Pen Inject 40 Units into the skin at bedtime.     [provider]  meloxicam (MOBIC) 15 MG tablet Take 15 mg by mouth at bedtime. 05/22/19   [provider]  meloxicam (MOBIC) 15 MG tablet Take 15 mg by mouth. 06/28/19   [provider]  mupirocin ointment (BACTROBAN) 2 % mupirocin 2 % topical ointment 06/28/19   [provider]  traMADol (ULTRAM) 50 MG tablet Take 1 tablet (50 mg total) by mouth every 6 (six) hours as needed. 07/12/19   Landis Martins, DPM  triamcinolone ointment (KENALOG) 0.1 % APPLY TO AFFECTED AREAS TWICE DAILY AS NEEDED. 07/05/19   [provider]    Allergies    Penicillins, Humalog mix 50-50  [insulin lispro prot & lispro], Humalog [insulin lispro], Semaglutide, and Metformin  Review of Systems   Review of Systems  Constitutional: Negative for fever.  HENT: Negative for rhinorrhea and sore throat.   Eyes: Negative for redness.  Respiratory: Negative for cough.   Cardiovascular: Negative for chest pain.  Gastrointestinal: Positive for abdominal pain, nausea and vomiting. Negative for diarrhea.  Genitourinary: Negative for dysuria, frequency, hematuria and urgency.  Musculoskeletal: Negative for myalgias.  Skin: Negative for rash.  Neurological: Negative for headaches.    Physical Exam Updated Vital Signs BP (!) 149/77 (BP Location: Left Arm)   Pulse 94   Temp 97.6 F (36.4 C) (Oral)   Resp 15   SpO2 99%   Physical Exam Vitals and nursing note reviewed.  Constitutional:      General: She is not in acute distress.    Appearance: She is well-developed.  HENT:     Head:  Normocephalic and atraumatic.     Right Ear: External ear normal.     Left Ear: External ear normal.     Nose: Nose normal.  Eyes:     Conjunctiva/sclera: Conjunctivae normal.  Cardiovascular:     Rate and Rhythm: Normal rate and regular rhythm.     Heart sounds: No murmur heard.   Pulmonary:     Effort: No respiratory distress.     Breath sounds: No wheezing, rhonchi or rales.  Abdominal:     Palpations: Abdomen is soft.     Tenderness: There is abdominal tenderness (mild) in the epigastric area and left upper quadrant. There is no guarding or rebound. Negative signs include Murphy's sign and McBurney's sign.  Musculoskeletal:     Cervical back: Normal range of motion and neck supple.  Right lower leg: No edema.     Left lower leg: No edema.  Skin:    General: Skin is warm and dry.     Findings: No rash.  Neurological:     General: No focal deficit present.     Mental Status: She is alert. Mental status is at baseline.     Motor: No weakness.  Psychiatric:        Mood and Affect: Mood normal.     ED Results / Procedures / Treatments   Labs (all labs ordered are listed, but only abnormal results are displayed) Labs Reviewed  COMPREHENSIVE METABOLIC PANEL - Abnormal; Notable for the following components:      Result Value   Sodium 134 (*)    Potassium 5.3 (*)    CO2 18 (*)    Glucose, Bld 132 (*)    BUN 54 (*)    Creatinine, Ser 2.43 (*)    Albumin 3.3 (*)    AST 10 (*)    GFR, Estimated 20 (*)    All other components within normal limits  CBC - Abnormal; Notable for the following components:   WBC 12.5 (*)    RDW 15.9 (*)    All other components within normal limits  CBG MONITORING, ED - Abnormal; Notable for the following components:   Glucose-Capillary 127 (*)    All other components within normal limits  TROPONIN I (HIGH SENSITIVITY) - Abnormal; Notable for the following components:   Troponin I (High Sensitivity) 26 (*)    All other components within  normal limits  RESP PANEL BY RT-PCR (FLU A&B, COVID) ARPGX2  LIPASE, BLOOD  URINALYSIS, ROUTINE W REFLEX MICROSCOPIC  TROPONIN I (HIGH SENSITIVITY)    ED ECG REPORT   Date: 10/28/2020  Rate: 95  Rhythm: normal sinus rhythm  QRS Axis: right  Intervals: normal  ST/T Wave abnormalities: normal  Conduction Disutrbances:none  Narrative Interpretation:   Old EKG Reviewed: changes noted, right axis deviation today otherwise unchanged morphology  I have personally reviewed the EKG tracing and agree with the computerized printout as noted.  Radiology CT ABDOMEN PELVIS WO CONTRAST  Result Date: 10/28/2020 CLINICAL DATA:  Abdominal pain, nausea, vomiting EXAM: CT ABDOMEN AND PELVIS WITHOUT CONTRAST TECHNIQUE: Multidetector CT imaging of the abdomen and pelvis was performed following the standard protocol without IV contrast. COMPARISON:  Ultrasound 09/18/2020. FINDINGS: Lower chest: Included lung bases are clear. Heart size is normal. Coronary artery calcification is present. Hepatobiliary: No focal liver abnormality is evident on noncontrast study. Status post cholecystectomy. No biliary dilatation. Pancreas: Unremarkable. No pancreatic ductal dilatation or surrounding inflammatory changes. Spleen: Normal in size without focal abnormality. Adrenals/Urinary Tract: Unremarkable adrenal glands. There are a few tiny calcified densities within the bilateral renal hila, likely reflecting vascular calcifications. Tiny nonobstructing calculi not excluded. 1.4 cm low-density lesion at the lower pole of the right kidney, recently described as a cyst by ultrasound. Kidneys are otherwise within normal limits. No hydronephrosis. Ureters and urinary bladder are unremarkable. Stomach/Bowel: Circumferential thickening of the distal esophagus (series 6, images 81-85). Small sliding type hiatal hernia. Subtle fat stranding in the region of the duodenal bulb (series 6, image 52). Stomach appears within normal limits. No  dilated loops of small bowel. The tip of the appendix is mildly dilated measuring 11 mm in diameter with subtle periappendiceal fat stranding (series 3, images 49-50). No adjacent fluid, extraluminal air, or abscess. Extensive diverticulosis most pronounced within the sigmoid and descending colon. No focally inflamed  diverticula or pericolonic inflammatory change. Vascular/Lymphatic: Aortic atherosclerosis. No enlarged abdominal or pelvic lymph nodes. Reproductive: Status post hysterectomy. No adnexal masses. Other: No free fluid. No abdominopelvic fluid collection. No pneumoperitoneum. Small fat containing umbilical hernia. Musculoskeletal: No acute or significant osseous findings. Multilevel degenerative disc disease of the lumbar spine. IMPRESSION: 1. The tip of the appendix is mildly dilated with subtle periappendiceal fat stranding. Findings are concerning for early acute uncomplicated tip appendicitis. 2. Subtle fat stranding in the region of the duodenal bulb, which may represent duodenitis or peptic ulcer disease. 3. Circumferential thickening of the distal esophagus, suggesting esophagitis. Correlation with nonemergent endoscopy is recommended. 4. Small sliding type hiatal hernia. 5. Extensive colonic diverticulosis without evidence of acute diverticulitis. 6. Aortic atherosclerosis (ICD10-I70.0). Electronically Signed   By: Davina Poke D.O.   On: 10/28/2020 14:07    Procedures Procedures   Medications Ordered in ED Medications - No data to display  ED Course  I have reviewed the triage vital signs and the nursing notes.  Pertinent labs & imaging results that were available during my care of the patient were reviewed by me and considered in my medical decision making (see chart for details).  Patient seen and examined. Work-up initiated.   Vital signs reviewed and are as follows: BP (!) 158/121   Pulse 91   Temp 97.6 F (36.4 C) (Oral)   Resp (!) 28   SpO2 100%   12:55 PM AKI  noted. BUN/creatinine > 20 consistent with pre-renal AKI.  CT abd/pelvis ordered. Nausea better controlled.  Pt and daughter updated. Plan for admission after completion of work-up.    CT shows possible esophagitis/duodenitis which fits with her clinical picture.  Radiology also made note of dilated tip of the appendix.  Possible early tip appendicitis.  I spoke with Claiborne Billings of general surgery and asked that they weigh in on appendix findings.  We will admit to medicine.  2:58 PM Spoke with [place] who will see. Asked that I give medication for BP -- ordered labetalol '10mg'$  IV.    3:06 PM Callback from [facility] -- they will hold off seeing patient given BP now in the 200's. Pt rechecked, her exam is unchanged. She confirms that she has been unable to take home enalapril due to N/V/.  RN giving labetalol now.   Will reassess BP after labetalol.   Signout to [employer] at shift change.     MDM Rules/Calculators/A&P                          Admit.   Final Clinical Impression(s) / ED Diagnoses Final diagnoses:  Intractable vomiting with nausea, unspecified vomiting type  Duodenitis  Acute kidney injury ([place])  Hypertension, unspecified type    Rx / DC Orders ED Discharge Orders    None       Carlisle Cater, PA-C 10/28/20 1509    Carmin Muskrat, MD 10/28/20 1630

## 2020-10-28 NOTE — Progress Notes (Addendum)
Consult and spoke with GI, Dr. Bryan Lemma, regarding pt's chronic NVD. Dr. Bryan Lemma would like pt NPO at midnight for endoscopy tomorrow.  Obtain GIPP, C. Diff, and start Protonix BID.   Lyndee Hensen, DO PGY-2, [facility] 10/28/2020

## 2020-10-28 NOTE — ED Provider Notes (Signed)
  Physical Exam  BP (!) 215/80   Pulse 85   Temp 97.6 F (36.4 C) (Oral)   Resp 19   SpO2 100%   Physical Exam  ED Course/Procedures   Clinical Course as of 10/28/20 1518  Wed Oct 28, 2020  1518 Creatinine(!): 2.43 [JS]    Clinical Course User Index [JS] Janeece Fitting, PA-C    Procedures  MDM  Patient care assumed from [place]. PA- at shift change, please see his note for a full HPI. Briefly, patient here with nausea, vomiting, and upper abdominal pain. He is insulin dependent diabetes N/V abdominal.She has an AKI 2.43 with a prior level in the past 11 months. Leukocytosis of 12.5. Attempted to be admitted by family medicine during prior shift but request for treating HTN, given 10 labetalol she will need to be reassess in 35-45 minutes.  Will also be evaluated by General Surgery.   Plan: BP reassess, delta trop, and general surgery to see.   Patient's blood pressure was reassessed, she is now 167/76.  Delta troponin is flat.  She has been evaluated by general surgery, they do not suspect appendicitis at this time.  They will continue to consult although this is likely a medical admission.  Call placed again to family practice in order to have patient admitted.  Patient admitted to family practice service, appreciate their help.  Portions of this note were generated with [employer]. Dictation errors may occur despite best attempts at proofreading.    Janeece Fitting, PA-C 10/28/20 1649    Horton, Alvin Critchley, DO 10/28/20 2331

## 2020-10-29 ENCOUNTER — Encounter (HOSPITAL_COMMUNITY)
Admission: EM | Disposition: A | Discharge status: Home / Self Care | Payer: Self-pay | Source: Home / Self Care | Attending: Family Medicine

## 2020-10-29 ENCOUNTER — Inpatient Hospital Stay (HOSPITAL_COMMUNITY): Payer: PPO | Admitting: Anesthesiology

## 2020-10-29 ENCOUNTER — Encounter (HOSPITAL_COMMUNITY): Payer: Self-pay | Admitting: Family Medicine

## 2020-10-29 DIAGNOSIS — R1110 Vomiting, unspecified: Secondary | ICD-10-CM

## 2020-10-29 DIAGNOSIS — R935 Abnormal findings on diagnostic imaging of other abdominal regions, including retroperitoneum: Secondary | ICD-10-CM

## 2020-10-29 DIAGNOSIS — R1010 Upper abdominal pain, unspecified: Secondary | ICD-10-CM

## 2020-10-29 DIAGNOSIS — I159 Secondary hypertension, unspecified: Secondary | ICD-10-CM

## 2020-10-29 DIAGNOSIS — R197 Diarrhea, unspecified: Secondary | ICD-10-CM

## 2020-10-29 DIAGNOSIS — K2980 Duodenitis without bleeding: Secondary | ICD-10-CM

## 2020-10-29 DIAGNOSIS — K2210 Ulcer of esophagus without bleeding: Secondary | ICD-10-CM

## 2020-10-29 DIAGNOSIS — E1165 Type 2 diabetes mellitus with hyperglycemia: Secondary | ICD-10-CM

## 2020-10-29 HISTORY — PX: BIOPSY: SHX5522

## 2020-10-29 HISTORY — PX: ESOPHAGOGASTRODUODENOSCOPY (EGD) WITH PROPOFOL: SHX5813

## 2020-10-29 LAB — CBC
HCT: 36.2 % (ref 36.0–46.0)
Hemoglobin: 11.5 g/dL — ABNORMAL LOW (ref 12.0–15.0)
MCH: 26 pg (ref 26.0–34.0)
MCHC: 31.8 g/dL (ref 30.0–36.0)
MCV: 81.9 fL (ref 80.0–100.0)
Platelets: 254 10*3/uL (ref 150–400)
RBC: 4.42 MIL/uL (ref 3.87–5.11)
RDW: 15.9 % — ABNORMAL HIGH (ref 11.5–15.5)
WBC: 12.6 10*3/uL — ABNORMAL HIGH (ref 4.0–10.5)
nRBC: 0 % (ref 0.0–0.2)

## 2020-10-29 LAB — BASIC METABOLIC PANEL
Anion gap: 9 (ref 5–15)
BUN: 49 mg/dL — ABNORMAL HIGH (ref 8–23)
CO2: 19 mmol/L — ABNORMAL LOW (ref 22–32)
Calcium: 8.9 mg/dL (ref 8.9–10.3)
Chloride: 108 mmol/L (ref 98–111)
Creatinine, Ser: 2.04 mg/dL — ABNORMAL HIGH (ref 0.44–1.00)
GFR, Estimated: 25 mL/min — ABNORMAL LOW (ref >60)
Glucose, Bld: 90 mg/dL (ref 70–99)
Potassium: 5 mmol/L (ref 3.5–5.1)
Sodium: 136 mmol/L (ref 135–145)

## 2020-10-29 LAB — GLUCOSE, CAPILLARY
Glucose-Capillary: 83 mg/dL (ref 70–99)
Glucose-Capillary: 91 mg/dL (ref 70–99)
Glucose-Capillary: 142 mg/dL — ABNORMAL HIGH (ref 70–99)
Glucose-Capillary: 87 mg/dL (ref 70–99)
Glucose-Capillary: 90 mg/dL (ref 70–99)

## 2020-10-29 SURGERY — ESOPHAGOGASTRODUODENOSCOPY (EGD) WITH PROPOFOL
Anesthesia: Monitor Anesthesia Care

## 2020-10-29 MED ORDER — PROPOFOL 10 MG/ML IV BOLUS
INTRAVENOUS | Status: DC | PRN
  Administered 2020-10-29: 20 mg via INTRAVENOUS
  Administered 2020-10-29: 30 mg via INTRAVENOUS

## 2020-10-29 MED ORDER — ACETAMINOPHEN 325 MG PO TABS
650.0000 mg | ORAL_TABLET | ORAL | Status: DC | PRN
  Administered 2020-10-29 – 2020-11-01 (×3): 650 mg via ORAL
  Filled 2020-10-29 (×3): qty 2

## 2020-10-29 MED ORDER — GLUCOSE 4 G PO CHEW
CHEWABLE_TABLET | ORAL | Status: AC
  Filled 2020-10-29: qty 1

## 2020-10-29 MED ORDER — LABETALOL HCL 5 MG/ML IV SOLN
5.0000 mg | Freq: Once | INTRAVENOUS | Status: AC
  Administered 2020-10-29: 5 mg via INTRAVENOUS
  Filled 2020-10-29: qty 4

## 2020-10-29 MED ORDER — LIDOCAINE 2% (20 MG/ML) 5 ML SYRINGE
INTRAMUSCULAR | Status: DC | PRN
  Administered 2020-10-29: 40 mg via INTRAVENOUS

## 2020-10-29 MED ORDER — CIPROFLOXACIN IN D5W 400 MG/200ML IV SOLN
400.0000 mg | Freq: Two times a day (BID) | INTRAVENOUS | Status: DC
  Filled 2020-10-29: qty 200

## 2020-10-29 MED ORDER — SODIUM CHLORIDE 0.9 % IV SOLN: INTRAVENOUS | Status: DC

## 2020-10-29 MED ORDER — HYDRALAZINE HCL 20 MG/ML IJ SOLN
10.0000 mg | Freq: Three times a day (TID) | INTRAMUSCULAR | Status: DC
  Administered 2020-10-29 (×2): 10 mg via INTRAVENOUS
  Filled 2020-10-29 (×3): qty 1

## 2020-10-29 MED ORDER — PROPOFOL 500 MG/50ML IV EMUL
INTRAVENOUS | Status: DC | PRN
  Administered 2020-10-29: 75 ug/kg/min via INTRAVENOUS

## 2020-10-29 SURGICAL SUPPLY — 14 items

## 2020-10-29 NOTE — Consult Note (Addendum)
[facility] Consult: 10:29 AM 10/29/2020  LOS: 1 day    Referring Provider: Dr Ardelia Mems. Primary Care Physician:  Randel Books, FNP Primary Gastroenterologist: None    Reason for Consultation: Nausea, vomiting, diarrhea, abdominal pain   HPI: Lauren Craig is a 75 y.o. female.  PMH insulin-dependent type 2 diabetes.  Hypertension.  Toe osteomyelitis.  Gallstones. No previous EGD or colonoscopy.  Tells me that for 14 months she has had these symptoms of watery, nonbloody, not melenic diarrhea, nonbloody emesis, diffuse abdominal pain.  For several days unable to keep down her p.o. meds.  Symptoms are 24/7.   Tells me that she was admitted for 1 week w similar complaints in 2021 and admitted for 3 or 4 days last month.  Both admissions at [place] scan and MRI last month at [facility].  After last month's admission she was referred to GI and has a pending appointment with a GI in [place] within the next month. Feeling progressively weaker.  Unable to keep down her p.o. meds but urinating okay.  Diarrhea has traditionally had short lasting response to Imodium and bismuth to control the diarrhea.  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  CT w ? Tip appendicitis.  Fat stranding at duodenal bulb.  Thickened distal esophagus. Hgb 11.5.  WBCs 12.6.   + AKI.  LFTs, Lipase normal except albumin low of 3.3.     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  No EtOH, no cigarettes.   Past Medical History:  Diagnosis Date  . Arthritis    hands, hips  . Essential (primary) hypertension 1990  . Mixed hyperlipidemia  2010  . Osteomyelitis ([place])    left 5th toe  . Seasonal allergies   . Type 2 diabetes mellitus with diabetic cataract ([place]) 2002    Past Surgical History:  Procedure Laterality Date  . ABDOMINAL AORTOGRAM W/LOWER EXTREMITY N/A 08/11/2017   Procedure: ABDOMINAL AORTOGRAM W/LOWER EXTREMITY;  Surgeon: Angelia Mould, MD;  Location: [place] [facility];  Service: Cardiovascular;  Laterality: N/A;  . ABDOMINAL HYSTERECTOMY    . AMPUTATION Left 11/14/2019   Procedure: Left Fifth ray amputation;  Surgeon: Wylene Simmer, MD;  Location: [place];  Service: Orthopedics;  Laterality: Left;  . AMPUTATION TOE Left 05/17/2018   Procedure: 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;  Surgeon: Wylene Simmer, MD;  Location: [place];  Service: Orthopedics;  Laterality: Left;  . PARTIAL HYMENECTOMY  1968  . TONSILLECTOMY AND ADENOIDECTOMY    . VAGINAL HYSTERECTOMY  1987    Prior to Admission medications   Medication Sig Start Date End Date Taking? Authorizing Provider  atorvastatin (LIPITOR) 40 MG tablet Take 40 mg by mouth daily.   Yes [provider]  enalapril (VASOTEC) 20 MG tablet Take 20 mg by mouth 2 (two) times daily.   Yes [provider]  insulin glargine (LANTUS) 100 UNIT/ML Solostar Pen Inject 25 Units into the skin in the morning and at bedtime.   Yes [provider]  traMADol (ULTRAM) 50 MG tablet Take 1 tablet (50 mg total) by mouth every 6 (six) hours as needed. 07/12/19  Yes Stover, Lady Saucier, DPM  albuterol (PROVENTIL HFA;VENTOLIN HFA) 108 (90 Base) MCG/ACT inhaler Inhale 2 puffs into the lungs every 6 (six) hours as needed for wheezing or shortness of breath.    [provider]  triamcinolone ointment (KENALOG) 0.1 % APPLY TO AFFECTED AREAS TWICE DAILY AS NEEDED. 07/05/19   [provider]    Scheduled Meds: . glucose      . hydrALAZINE  5 mg Intravenous Q8H  . insulin aspart  0-9 Units Subcutaneous TID WC  . pantoprazole  (PROTONIX) IV  40 mg Intravenous Q12H   Infusions: . lactated ringers 125 mL/hr at 10/29/20 0207   PRN Meds: ondansetron (ZOFRAN) IV   Allergies as of 10/28/2020 - Review Complete 10/28/2020  Allergen Reaction Noted  . Penicillins Hives, Rash, and Other (See Comments) 07/24/2017  . Insulin lispro Itching and Other (See Comments) 08/07/2017  . Insulin lispro prot & lispro Other (See Comments) 07/11/2018  . Semaglutide Nausea And Vomiting 06/28/2019  . Metformin Diarrhea 06/28/2019    Family History  Problem Relation Age of Onset  . Diabetes Mellitus II Mother   . Hypertension Father   . Heart Problems Brother        pacemaker  . Diabetes Mellitus II Brother   . Bipolar disorder Brother     Social History   Socioeconomic History  . Marital status: Divorced    Spouse name: Not on file  . Number of children: 2  . Years of education: Not on file  . Highest education level: Not on file  Occupational History  . Occupation: substitute Product manager: [employer]  . 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 Smoking status: Former Smoker    Types: Cigarettes    Quit date: 2004    Years since quitting: 18.4  . Smokeless tobacco: Never Used  Vaping Use  . Vaping Use: Never used  Substance and Sexual Activity  . Alcohol use: Yes    Alcohol/week: 1.0 standard drink    Types: 1 Glasses of wine per week    Comment: wine.  occasional  . Drug use: No  . Sexual activity: Not Currently  Other Topics Concern  . Not on file  Social History Narrative  . Not on file   Social Determinants of Health   Financial Resource Strain: Not on file  Food Insecurity: Not on file  Transportation Needs: Not on file  Physical Activity: Not on file  Stress: Not on file  Social Connections: Not on file  Intimate Partner Violence: Not on file    REVIEW OF SYSTEMS: Constitutional:  Generalized weakness, fatigue. ENT:  No nose bleeds Pulm: No shortness of breath or cough CV:  No palpitations, no LE edema.  No angina GU:  No hematuria, no frequency GI: See HPI. Heme: No unusual bleeding or bruising.  No prior history of anemia. Transfusions: None. Neuro:  No headaches, no peripheral tingling or numbness.  No syncope, no seizures. Derm:  No itching, no rash or sores.  Endocrine:  No sweats or chills.  No polyuria or dysuria Immunization: Not queried.  Travel:  None beyond local counties in last few months.    PHYSICAL EXAM: Vital signs in last 24 hours: Vitals:   10/29/20 0642 10/29/20 0659  BP: (!) 191/64 (!) 182/84  Pulse:    Resp:    Temp:    SpO2:     Wt Readings from Last 3 Encounters:  11/14/19 85.1 kg  07/11/18 78.9 kg  05/17/18 79 kg    General: Patient looks unwell and somewhat frail.  Comfortable but not feeling good. Head: No facial asymmetry or swelling.  No signs of head trauma. Eyes: No conjunctival pallor.  No scleral icterus. Ears: No hearing loss Nose: No congestion, no discharge Mouth: Oropharynx is moist, pink, clear.  Tongue midline.  Has about 8 or 10 remaining teeth in her lower jaw.  Dentures and partial are at home. Neck: No JVD, no masses, no thyromegaly Lungs: Clear bilaterally.  No labored breathing or cough Heart: RRR.  No MRG.  S1, S2 present Abdomen: Soft.  Minimal nonfocal, diffuse tenderness.  Active bowel sounds.  No distention.  No HSM, masses, bruits, hernias.   Rectal: Did not perform at request of patient. Musc/Skeltl: No joint redness, swelling or gross deformity. Extremities: No CCE Neurologic: Alert.  Appropriate.  Oriented x3.  Staccato speech pattern.  Periodic hand tremor at rest.  No asterixis.  Moves all 4 limbs, strength not tested. Skin: No rash, no sores, no telangiectasia Nodes: No cervical adenopathy Psych: Calm, pleasant, cooperative.  Intake/Output from previous day: 06/01 0701 - 06/02 0700 In:  500 [IV Piggyback:500] Out: -  Intake/Output this shift: No intake/output data recorded.  LAB RESULTS: Recent Labs    10/28/20 1106 10/29/20 0038  WBC 12.5* 12.6*  HGB 13.3 11.5*  HCT 42.3 36.2  PLT 328 254   BMET Lab Results  Component Value Date   NA 136 10/29/2020   NA 134 (L) 10/28/2020   NA 137 11/11/2019   K 5.0 10/29/2020   K 5.3 (H) 10/28/2020   K 4.4 11/11/2019   CL 108 10/29/2020   CL 106 10/28/2020   CL 103 11/11/2019   CO2 19 (L) 10/29/2020   CO2 18 (L) 10/28/2020   CO2 24 11/11/2019   GLUCOSE 90 10/29/2020   GLUCOSE 132 (H) 10/28/2020   GLUCOSE 202 (H) 11/11/2019   BUN 49 (H) 10/29/2020   BUN 54 (H) 10/28/2020   BUN 13 11/11/2019   CREATININE 2.04 (H) 10/29/2020   CREATININE 2.43 (H) 10/28/2020   CREATININE 0.92 11/11/2019   CALCIUM 8.9 10/29/2020   CALCIUM 9.5 10/28/2020   CALCIUM 9.0 11/11/2019   LFT Recent Labs    10/28/20 1106  PROT 6.9  ALBUMIN 3.3*  AST 10*  ALT 10  ALKPHOS 77  BILITOT 0.7   PT/INR No results found for: INR, PROTIME Hepatitis Panel No results for input(s): HEPBSAG, HCVAB, HEPAIGM, HEPBIGM in the last 72 hours. C-Diff No components found for: CDIFF Lipase     Component Value Date/Time   LIPASE 36 10/28/2020 1106    Drugs of Abuse  No results found for: LABOPIA, COCAINSCRNUR, LABBENZ, AMPHETMU, THCU, LABBARB   RADIOLOGY STUDIES: CT ABDOMEN PELVIS WO CONTRAST  Result Date: 10/28/2020 CLINICAL DATA:  Abdominal pain, nausea, vomiting EXAM: CT ABDOMEN AND PELVIS WITHOUT CONTRAST TECHNIQUE: Multidetector CT imaging of the abdomen and pelvis was performed following the standard protocol without IV contrast. COMPARISON:  Ultrasound 09/18/2020. FINDINGS: Lower chest: Included lung bases are clear. Heart size is normal. Coronary artery calcification is present. Hepatobiliary:  No focal liver abnormality is evident on noncontrast study. Status post cholecystectomy. No biliary dilatation. Pancreas: Unremarkable. No  pancreatic ductal dilatation or surrounding inflammatory changes. Spleen: Normal in size without focal abnormality. Adrenals/Urinary Tract: Unremarkable adrenal glands. There are a few tiny calcified densities within the bilateral renal hila, likely reflecting vascular calcifications. Tiny nonobstructing calculi not excluded. 1.4 cm low-density lesion at the lower pole of the right kidney, recently described as a cyst by ultrasound. Kidneys are otherwise within normal limits. No hydronephrosis. Ureters and urinary bladder are unremarkable. Stomach/Bowel: Circumferential thickening of the distal esophagus (series 6, images 81-85). Small sliding type hiatal hernia. Subtle fat stranding in the region of the duodenal bulb (series 6, image 52). Stomach appears within normal limits. No dilated loops of small bowel. The tip of the appendix is mildly dilated measuring 11 mm in diameter with subtle periappendiceal fat stranding (series 3, images 49-50). No adjacent fluid, extraluminal air, or abscess. Extensive diverticulosis most pronounced within the sigmoid and descending colon. No focally inflamed diverticula or pericolonic inflammatory change. Vascular/Lymphatic: Aortic atherosclerosis. No enlarged abdominal or pelvic lymph nodes. Reproductive: Status post hysterectomy. No adnexal masses. Other: No free fluid. No abdominopelvic fluid collection. No pneumoperitoneum. Small fat containing umbilical hernia. Musculoskeletal: No acute or significant osseous findings. Multilevel degenerative disc disease of the lumbar spine. IMPRESSION: 1. The tip of the appendix is mildly dilated with subtle periappendiceal fat stranding. Findings are concerning for early acute uncomplicated tip appendicitis. 2. Subtle fat stranding in the region of the duodenal bulb, which may represent duodenitis or peptic ulcer disease. 3. Circumferential thickening of the distal esophagus, suggesting esophagitis. Correlation with nonemergent endoscopy is  recommended. 4. Small sliding type hiatal hernia. 5. Extensive colonic diverticulosis without evidence of acute diverticulitis. 6. Aortic atherosclerosis (ICD10-I70.0). Electronically Signed   By: Davina Poke D.O.   On: 10/28/2020 14:07     IMPRESSION:   1.   14 months of diarrhea, nausea, vomiting, diffuse abdominal pain.  Nausea and vomiting worse in recent days and unable to tolerate p.o. CT concerning for appendicitis, duodenitis/PUD, esophagitis. General surgery consulted and "clinical picture not at all consistent with appendicitis".  Seen by surgery.  2.  Status post cholecystectomy  3.  Diabetes mellitus with complications  4.  AKI.     PLAN:     *    Needs upper endoscopy and colonoscopy.  EGD today.  Hold off on colonoscopy until able to tolerate bowel prep (may need to be outpatient).   *  Await collection and results of pndg C diff and stool path PCR panel.     Azucena Freed  10/29/2020, 10:29 AM Phone [phone]  GI ATTENDING  History, laboratories, x-rays reviewed.  Patient personally seen and examined.  Agree with comprehensive consultation note as outlined above.  Patient with multiple medical problems including diabetes mellitus and has had over a years worth of problems with intermittent nausea and vomiting as well as abdominal discomfort and diarrhea.  CT scan shows abnormalities of the esophagus and duodenum which may represent peptic disease.  She may have an element of diabetic gastroparesis as well as diabetic diarrhea.  Stool studies for enteric pathogens pending.  Agree with IV hydration, PPI, and plans for endoscopy.  We will perform endoscopy this afternoon.  Patient is high risk given her comorbidities.The nature of the procedure, as well as the risks, benefits, and alternatives were carefully and thoroughly reviewed with the patient. Ample time for discussion and questions allowed. The  patient understood, was satisfied, and agreed to proceed.   Additional recommendations post procedure (see report).  Docia Chuck. Geri Seminole., M.D. [facility] Division of Gastroenterology

## 2020-10-29 NOTE — Progress Notes (Signed)
OT Cancellation Note  Patient Details Name: Velveeta Blann MRN: [id number] DOB: 01-13-1946   Cancelled Treatment:    Reason Eval/Treat Not Completed: Patient at procedure or test/ unavailable. Will follow.   Malka So 10/29/2020, 2:02 PM  Nestor Lewandowsky, OTR/L Acute Rehabilitation Services Pager: [phone] Office: [phone]

## 2020-10-29 NOTE — Progress Notes (Signed)
Family Medicine Teaching Service Daily Progress Note Intern Pager: [phone]  Patient name: Lauren Craig Medical record number: [id number] Date of birth: 22-Jul-1945 Age: 75 y.o. Gender: female  Primary Care Provider: Randel Books, FNP Consultants: GI Code Status: Full  Pt Overview and Major Events to Date:  6/1: admitted, GI consulted  Assessment and Plan:  Lauren Craig is a 75 y.o. female presenting with vomiting and abdominal pain. PMH is significant for HTN, insulin-dependent T2DM, and osteomyelitis of the foot.  Vomiting, Abdominal Pain Patient with persistent nausea and abdominal discomfort this morning. Also with diarrhea. States her symptoms are the same as the ones over the past 1 year. CT scan suggestive of esophagitis, possible duodenitis and concern for early appendicitis. Gen surg evaluated patient and felt her clinical picture not at all consistent with appendicitis. -GI consulted, appreciate recommendations -Tentative plan for EGD today -GIPP and C. Diff pending -IV Zofran '4mg'$  q6h prn -IV Protonix '40mg'$  BID -mIVF (LR at 129m/hr) -NPO pending EGD  Hypertension BP persistently elevated, 182/84 this morning. States her BP is often elevated up to the 200s at home. Compliance with home BP meds is limited 2/2 GI symptoms. -Increase Hydralazine to '10mg'$  q8h -Close monitoring of BP -s/p labetalol '10mg'$  x1 in the ED -Holding home enalapril due to AKI  AKI Cr 2.04 this morning from 2.43 on admission. No hx of CKD, baseline Cr 0.9. Most likely pre-renal etiology in the setting of vomiting, poor PO intake. -IV fluids (LR @ 766mhr) -Daily BMP  T2DM Glucose 90 on am labs. Has not required any insulin since admission. A1c pending. 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  Only persistent deficit is drooping of right side of the mouth.  Patient is not on any anticoagulation or antiplatelet medications.  FEN/GI: NPO PPx: SCDs   Status is: Inpatient Remains inpatient appropriate because:Ongoing diagnostic testing needed not appropriate for outpatient work up   Dispo: The patient is from: Home              Anticipated d/c is to: Home              Patient currently is not medically stable to d/c.   Difficult to place patient No    Subjective:  No acute events overnight. Patient reports ongoing nausea, abdominal pain, and diarrhea this morning.  Objective: Temp:  [97.6 F (36.4 C)-98.2 F (36.8 C)] 98.2 F (36.8 C) (06/02 0517) Pulse Rate:  [67-94] 79 (06/02 0517) Resp:  [12-28] 18 (06/02 0517) BP: (106-218)/(56-121) 191/64 (06/02 0642) SpO2:  [95 %-100 %] 96 % (06/02 0517) Physical Exam: General: alert, ill-appearing Cardiovascular: RRR, normal S1/S2 without m/r/g Respiratory: normal effort, lungs CTAB Abdomen: +BS, soft, mild diffuse tenderness (greatest in RLQ and LLQ), no rebound or guarding Extremities: no peripheral edema Neuro: alert, oriented x3, no focal findings  Laboratory: Recent Labs  Lab 10/28/20 1106 10/29/20 0038  WBC 12.5* 12.6*  HGB 13.3 11.5*  HCT 42.3 36.2  PLT 328 254   Recent Labs  Lab 10/28/20 1106 10/29/20 0038  NA 134* 136  K 5.3* 5.0  CL 106 108  CO2 18* 19*  BUN 54* 49*  CREATININE 2.43* 2.04*  CALCIUM 9.5  8.9  PROT 6.9  --   BILITOT 0.7  --   ALKPHOS 77  --   ALT 10  --   AST 10*  --   GLUCOSE 132* 90     Imaging/Diagnostic Tests: CT ABDOMEN PELVIS WO CONTRAST Result Date: 10/28/2020 IMPRESSION: 1. The tip of the appendix is mildly dilated with subtle periappendiceal fat stranding. Findings are concerning for early acute uncomplicated tip appendicitis. 2. Subtle fat stranding in the region of the duodenal bulb, which may represent  duodenitis or peptic ulcer disease. 3. Circumferential thickening of the distal esophagus, suggesting esophagitis. Correlation with nonemergent endoscopy is recommended. 4. Small sliding type hiatal hernia. 5. Extensive colonic diverticulosis without evidence of acute diverticulitis. 6. Aortic atherosclerosis (ICD10-I70.0). Electronically Signed   By: Davina Poke D.O.   On: 10/28/2020 14:07    Alcus Dad, MD 10/29/2020, 6:44 AM PGY-1, [place] Intern pager: [phone], text pages welcome

## 2020-10-29 NOTE — Anesthesia Preprocedure Evaluation (Signed)
Anesthesia Evaluation  Patient identified by MRN, date of birth, ID band Patient awake    Reviewed: Allergy & Precautions, NPO status , Patient's Chart, lab work & pertinent test results  Airway Mallampati: II  TM Distance: >3 FB Neck ROM: Full    Dental  (+) Dental Advisory Given   Pulmonary former smoker,    breath sounds clear to auscultation       Cardiovascular hypertension, Pt. on medications  Rhythm:Regular Rate:Normal     Neuro/Psych negative neurological ROS     GI/Hepatic Neg liver ROS, N/v/d   Endo/Other  diabetes, Type 2, Insulin Dependent  Renal/GU Renal disease     Musculoskeletal  (+) Arthritis ,   Abdominal   Peds  Hematology  (+) anemia ,   Anesthesia Other Findings   Reproductive/Obstetrics                             Anesthesia Physical Anesthesia Plan  ASA: III  Anesthesia Plan: MAC   Post-op Pain Management:    Induction:   PONV Risk Score and Plan: 2 and Propofol infusion, Ondansetron and Treatment may vary due to age or medical condition  Airway Management Planned: Natural Airway and Nasal Cannula  Additional Equipment:   Intra-op Plan:   Post-operative Plan:   Informed Consent: I have reviewed the patients History and Physical, chart, labs and discussed the procedure including the risks, benefits and alternatives for the proposed anesthesia with the patient or authorized representative who has indicated his/her understanding and acceptance.       Plan Discussed with: CRNA  Anesthesia Plan Comments:         Anesthesia Quick Evaluation

## 2020-10-29 NOTE — Progress Notes (Addendum)
FPTS Interim Progress Note  Patient sleeping and resting comfortably.  Rounded with primary RN.  Requested to have Tramadol ordered tomorrow.  Patient reported to RN that she used this to help sleep.  Currently sleeping without Tramadol.  No other concerns voiced.  No orders required.  Appreciated nightly round.  Today's Vitals   10/28/20 1815 10/28/20 1845 10/28/20 2134 10/29/20 0011  BP: (!) 163/56  120/70 (!) 166/65  Pulse: 70  70 86  Resp: '16  18 20  '$ Temp:  97.7 F (36.5 C) 98 F (36.7 C) 97.7 F (36.5 C)  TempSrc:  Oral Oral Oral  SpO2: 96%  96% 96%  PainSc:       Will discuss with day team to review Home medications Tramadol and Gabapentin in am    Carollee Leitz, MD Family Medicine Residency

## 2020-10-29 NOTE — Progress Notes (Signed)
Received page regarding patient requesting something for headache as well as immodium.   - Tylenol 650 mg q4hr PRN - If not improved with Tylenol, indicated for nurse to page and we will come up and assess further - Want to continue to hold Immodium in setting of unknown cause of diarrhea   Delora Fuel, MD 10/29/2020, 11:04 PM PGY-1, [place] Medicine Service pager [phone]

## 2020-10-29 NOTE — Op Note (Signed)
[facility] Patient Name: Lauren Craig Procedure Date : 10/29/2020 MRN: [id number] Attending MD: Docia Chuck. Henrene Pastor , MD Date of Birth: August 13, 1945 CSN: [id number] Age: 75 Admit Type: Inpatient Procedure:                Upper GI endoscopy with biopsies Indications:              Epigastric abdominal pain, Abnormal CT of the GI                            tract, Nausea with vomiting for > 1 year. Providers:                Docia Chuck. Henrene Pastor, MD, Josie Dixon, RN, Laverda Sorenson, Technician, Herminio Commons Referring MD:             [facility] hospitalist Medicines:                Monitored Anesthesia Care Complications:            No immediate complications. Estimated Blood Loss:     Estimated blood loss: none. Procedure:                Pre-Anesthesia Assessment:                           - Prior to the procedure, a History and Physical                            was performed, and patient medications and                            allergies were reviewed. The patient's tolerance of                            previous anesthesia was also reviewed. The risks                            and benefits of the procedure and the sedation                            options and risks were discussed with the patient.                            All questions were answered, and informed consent                            was obtained. Prior Anticoagulants: The patient has                            taken no previous anticoagulant or antiplatelet                            agents. ASA Grade Assessment: III - A patient with  severe systemic disease. After reviewing the risks                            and benefits, the patient was deemed in                            satisfactory condition to undergo the procedure.                           After obtaining informed consent, the endoscope was                            passed under direct vision.  Throughout the                            procedure, the patient's blood pressure, pulse, and                            oxygen saturations were monitored continuously. The                            [id number]) Olympus gastroscope was                            introduced through the mouth, and advanced to the                            second part of duodenum. The upper GI endoscopy was                            accomplished without difficulty. The patient                            tolerated the procedure well. Scope In: Scope Out: Findings:      LA Grade D (one or more mucosal breaks involving at least 75% of       esophageal circumference) esophagitis was found in the distal.      The esophagus also revealed mild stricturing.      The stomach was normal save a hiatal hernia . Biopsies were hiatal       herniataken with a cold forceps for histology.      The examined duodenum severe duodenitis involving the bulb. There was       significant edema.      The cardia and gastric fundus were normal on retroflexion. Impression:               1. Severe reflux esophagitis                           2. Severe duodenitis. Status post biopsies                           3. Otherwise unremarkable EGD                           4. Diabetes mellitus. Recommendation:  1. Pantoprazole 40 mg twice daily. The patient                            should stay on this until further notice                           2. Soft diet                           3. Good diabetic control                           4. Antinausea medicine as needed. The patient                            states that she has Zofran and Phenergan at home.                            Please prescribe more if needed                           5. Outpatient GI follow-up has been arranged for                            November 27, 2020 at 3 PM. She will be seeing [facility]                            nurse practitioner Tye Savoy                           GI will sign off. Patient is okay for discharge                            when taking adequate oral intake. Hopefully in the                            morning                           I have discussed all the above with the patient and                            provided her a copy of her endoscopy report                           . Procedure Code(s):        --- Professional ---                           [phone], Esophagogastroduodenoscopy, flexible,                            transoral; with biopsy, single or multiple Diagnosis Code(s):        --- Professional ---  K21.00, Gastro-esophageal reflux disease with                            esophagitis, without bleeding                           R10.13, Epigastric pain                           R11.2, Nausea with vomiting, unspecified                           R93.3, Abnormal findings on diagnostic imaging of                            other parts of digestive tract CPT copyright 2019 American Medical Association. All rights reserved. The codes documented in this report are preliminary and upon coder review may  be revised to meet current compliance requirements. Docia Chuck. Henrene Pastor, MD 10/29/2020 2:56:30 PM This report has been signed electronically. Number of Addenda: 0

## 2020-10-29 NOTE — Transfer of Care (Signed)
Immediate Anesthesia Transfer of Care Note  Patient: Lauren Craig  Procedure(s) Performed: ESOPHAGOGASTRODUODENOSCOPY (EGD) WITH PROPOFOL (N/A ) BIOPSY  Patient Location: Endoscopy Unit  Anesthesia Type:MAC  Level of Consciousness: drowsy and patient cooperative  Airway & Oxygen Therapy: Patient Spontanous Breathing  Post-op Assessment: Report given to RN and Post -op Vital signs reviewed and stable  Post vital signs: Reviewed and stable  Last Vitals:  Vitals Value Taken Time  BP 155/52 10/29/20 1451  Temp    Pulse 70 10/29/20 1453  Resp 22 10/29/20 1453  SpO2 97 % 10/29/20 1453  Vitals shown include unvalidated device data.  Last Pain:  Vitals:   10/29/20 1350  TempSrc:   PainSc: 0-No pain         Complications: No complications documented.

## 2020-10-30 LAB — GASTROINTESTINAL PANEL BY PCR, STOOL (REPLACES STOOL CULTURE)

## 2020-10-30 LAB — C DIFFICILE QUICK SCREEN W PCR REFLEX
C Diff antigen: POSITIVE — AB
C Diff toxin: NEGATIVE

## 2020-10-30 LAB — PHOSPHORUS: Phosphorus: 3.5 mg/dL (ref 2.5–4.6)

## 2020-10-30 LAB — GLUCOSE, CAPILLARY
Glucose-Capillary: 98 mg/dL (ref 70–99)
Glucose-Capillary: 116 mg/dL — ABNORMAL HIGH (ref 70–99)
Glucose-Capillary: 117 mg/dL — ABNORMAL HIGH (ref 70–99)

## 2020-10-30 LAB — BASIC METABOLIC PANEL
Anion gap: 8 (ref 5–15)
BUN: 35 mg/dL — ABNORMAL HIGH (ref 8–23)
CO2: 18 mmol/L — ABNORMAL LOW (ref 22–32)
Calcium: 8.9 mg/dL (ref 8.9–10.3)
Chloride: 112 mmol/L — ABNORMAL HIGH (ref 98–111)
Creatinine, Ser: 1.78 mg/dL — ABNORMAL HIGH (ref 0.44–1.00)
GFR, Estimated: 29 mL/min — ABNORMAL LOW (ref >60)
Glucose, Bld: 105 mg/dL — ABNORMAL HIGH (ref 70–99)
Potassium: 4.4 mmol/L (ref 3.5–5.1)
Sodium: 138 mmol/L (ref 135–145)

## 2020-10-30 LAB — HEMOGLOBIN A1C
Hgb A1c MFr Bld: 6.1 % — ABNORMAL HIGH (ref 4.8–5.6)
Mean Plasma Glucose: 128 mg/dL

## 2020-10-30 LAB — MAGNESIUM: Magnesium: 1.5 mg/dL — ABNORMAL LOW (ref 1.7–2.4)

## 2020-10-30 LAB — CLOSTRIDIUM DIFFICILE BY PCR, REFLEXED: Toxigenic C. Difficile by PCR: POSITIVE — AB

## 2020-10-30 MED ORDER — ATORVASTATIN CALCIUM 40 MG PO TABS
40.0000 mg | ORAL_TABLET | Freq: Every day | ORAL | Status: DC
  Administered 2020-10-30 – 2020-11-01 (×3): 40 mg via ORAL
  Filled 2020-10-30 (×3): qty 1

## 2020-10-30 MED ORDER — FIDAXOMICIN 200 MG PO TABS
200.0000 mg | ORAL_TABLET | Freq: Two times a day (BID) | ORAL | Status: DC
  Administered 2020-10-31 – 2020-11-01 (×3): 200 mg via ORAL
  Filled 2020-10-30 (×5): qty 1

## 2020-10-30 MED ORDER — FAMOTIDINE 20 MG PO TABS
10.0000 mg | ORAL_TABLET | Freq: Every day | ORAL | Status: DC
  Administered 2020-10-30 – 2020-11-01 (×3): 10 mg via ORAL
  Filled 2020-10-30 (×3): qty 1

## 2020-10-30 MED ORDER — HYDRALAZINE HCL 20 MG/ML IJ SOLN
5.0000 mg | Freq: Once | INTRAMUSCULAR | Status: AC
  Administered 2020-10-30: 5 mg via INTRAVENOUS

## 2020-10-30 MED ORDER — AMLODIPINE BESYLATE 10 MG PO TABS
10.0000 mg | ORAL_TABLET | Freq: Every day | ORAL | Status: DC
  Administered 2020-10-30 – 2020-11-01 (×3): 10 mg via ORAL
  Filled 2020-10-30 (×3): qty 1

## 2020-10-30 MED ORDER — SERTRALINE HCL 50 MG PO TABS
50.0000 mg | ORAL_TABLET | Freq: Every day | ORAL | Status: DC
  Administered 2020-10-31: 50 mg via ORAL
  Filled 2020-10-30: qty 1

## 2020-10-30 MED ORDER — ENOXAPARIN SODIUM 30 MG/0.3ML IJ SOSY
30.0000 mg | PREFILLED_SYRINGE | INTRAMUSCULAR | Status: DC
  Administered 2020-10-30 – 2020-10-31 (×2): 30 mg via SUBCUTANEOUS
  Filled 2020-10-30 (×2): qty 0.3

## 2020-10-30 MED ORDER — MAGNESIUM SULFATE 2 GM/50ML IV SOLN
2.0000 g | Freq: Once | INTRAVENOUS | Status: AC
  Administered 2020-10-30: 2 g via INTRAVENOUS
  Filled 2020-10-30: qty 50

## 2020-10-30 MED ORDER — SUCRALFATE 1 G PO TABS
1.0000 g | ORAL_TABLET | Freq: Every day | ORAL | Status: DC
Start: 2020-10-30 — End: 2020-11-01
  Administered 2020-10-30 – 2020-10-31 (×2): 1 g via ORAL
  Filled 2020-10-30 (×2): qty 1

## 2020-10-30 MED ORDER — FLUTICASONE PROPIONATE 50 MCG/ACT NA SUSP
1.0000 | Freq: Every day | NASAL | Status: DC
  Administered 2020-10-30 – 2020-11-01 (×3): 1 via NASAL
  Filled 2020-10-30: qty 16

## 2020-10-30 MED ORDER — SERTRALINE HCL 50 MG PO TABS: 50.0000 mg | ORAL_TABLET | Freq: Every day | ORAL | Status: DC

## 2020-10-30 NOTE — Evaluation (Signed)
Physical Therapy Evaluation Patient Details Name: Lauren Craig MRN: [id number] DOB: [birth year] Today's Date: 10/30/2020   History of Present Illness  Lauren Craig is a 75 y.o. female admitted 10/28/20  presenting with persistent nausea and vomiting, diarrhea.CT scan suggestive of esophagitis, possible duodenitis and concern for early appendicitis (GI does not think this is what it is). EGD 6/2: Severe reflux esophagitis, Severe duodenitis.  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 Generally able to mobilize on a min guard basis with no device- would benefit from RW, but declined trying it in the room this morning. Slow and mildly unsteady, but with good safety awareness and functional. Left up in recliner with OT present and attending. Would likely benefit from skilled HHPT f/u at DC.     Follow Up Recommendations Home health PT;Supervision for mobility/OOB    Equipment Recommendations  None recommended by PT (seems well equipped)    Recommendations for Other Services       Precautions / Restrictions Precautions Precautions: Fall;Other (comment) Precaution Comments: enteric precautions Restrictions Weight Bearing Restrictions: No      Mobility  Bed Mobility               General bed mobility comments: in bathroom with OT upon entry    Transfers Overall transfer level: Needs assistance Equipment used: None Transfers: Sit to/from Stand Sit to Stand: Min guard         General transfer comment: increased time, no significant physical assistance given  Ambulation/Gait Ambulation/Gait assistance: Min guard Gait Distance (Feet): 15 Feet Assistive device: None Gait Pattern/deviations: Step-through pattern;Decreased step length - right;Decreased step length - left;Trunk flexed Gait velocity:  decreased   General Gait Details: slow and mildly unsteady, politely declines use of RW. Able to ambulate in room distances without too much difficulty and min guard  Stairs            Wheelchair Mobility    Modified Rankin (Stroke Patients Only)       Balance Overall balance assessment: Mild deficits observed, not formally tested                                           Pertinent Vitals/Pain Pain Assessment: No/denies pain    Home Living Family/patient expects to be discharged to:: Private residence Living Arrangements: Children;Other (Comment) (daughter) Available Help at Discharge: Family;Available PRN/intermittently (daughter works as a Pharmacist, hospital, will be out for the summer) Type of Home: House Home Access: Stairs to enter   [employer] of Steps: 1 STE Home Layout: Two level [place]: Shower seat - built in;Grab bars - tub/shower;Wheelchair - Education administrator (comment);Cane - single point;Walker - 2 wheels (walking stick) Additional Comments: 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 usually works for full days with no assistive device. 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    Prior Function Level of Independence: Independent               Journalist, newspaper        Extremity/Trunk Assessment   Upper Extremity Assessment Upper Extremity Assessment: Defer to OT evaluation    Lower Extremity Assessment Lower Extremity Assessment: Generalized weakness    Cervical / Trunk Assessment  Cervical / Trunk Assessment: Kyphotic  Communication   Communication: Expressive difficulties  Cognition Arousal/Alertness: Awake/alert Behavior During Therapy: WFL for tasks assessed/performed;Flat affect Overall Cognitive Status: Within Functional Limits for tasks assessed                                 General Comments: pleasant and cooperative      General Comments      Exercises      Assessment/Plan    PT Assessment Patient needs continued PT services  PT Problem List Decreased strength;Decreased knowledge of use of DME;Decreased activity tolerance;Decreased safety awareness;Decreased balance;Decreased mobility;Decreased coordination       PT Treatment Interventions DME instruction;Balance training;Gait training;Stair training;Functional mobility training;Patient/family education;Therapeutic activities;Therapeutic exercise    PT Goals (Current goals can be found in the Care Plan section)  Acute Rehab PT Goals Patient Stated Goal: go home, return to work PT Goal Formulation: With patient Time For Goal Achievement: 11/13/20 Potential to Achieve Goals: Good    Frequency Min 3X/week   Barriers to discharge        Co-evaluation               AM-PAC PT "6 Clicks" Mobility  Outcome Measure Help needed turning from your back to your side while in a flat bed without using bedrails?: None Help needed moving from lying on your back to sitting on the side of a flat bed without using bedrails?: A Little Help needed moving to and from a bed to a chair (including a wheelchair)?: A Little Help needed standing up from a chair using your arms (e.g., wheelchair or bedside chair)?: A Little Help needed to walk in hospital room?: A Little Help needed climbing 3-5 steps with a railing? : A Lot 6 Click Score: 18    End of Session   Activity Tolerance: Patient tolerated treatment well Patient left: in chair;with call bell/phone within reach;with chair alarm set;Other (comment) (OT present and attending) Nurse Communication: Mobility status PT Visit Diagnosis: Unsteadiness on feet (R26.81);Difficulty in walking, not elsewhere classified (R26.2);Muscle weakness (generalized) (M62.81)    Time: [id number] PT Time Calculation (min) (ACUTE ONLY): 22 min   Charges:   PT Evaluation $PT Eval Moderate Complexity: 1 Mod          Windell Norfolk, DPT, PN1   Supplemental  Physical Therapist [place]    Pager [phone] Acute Rehab Office [phone]

## 2020-10-30 NOTE — Progress Notes (Addendum)
Family Medicine Teaching Service Daily Progress Note Intern Pager: [phone]  Patient name: Lauren Craig Medical record number: [id number] Date of birth: [birth year] Age: 75 y.o. Gender: female  Primary Care Provider: Randel Books, FNP Consultants: GI Code Status: Full  Pt Overview and Major Events to Date:  6/1: admitted, GI consulted 6/2: underwent EGD  Assessment and Plan:  Lauren Craig is a 75 y.o. female presenting with vomiting and abdominal pain. PMH is significant for HTN, insulin-dependent T2DM, prior CVA, and osteomyelitis of the foot.  Vomiting, Abdominal Pain EGD performed yesterday showed severe esophagitis and severe duodenitis. She reports pain is improved this morning. Still having some diarrhea and decreased appetite, but no nausea/vomiting. Has had minimal PO intake since her EGD. -GI has signed off -GIPP and C. Diff pending -IV Zofran '4mg'$  q6h prn -IV Protonix '40mg'$  BID, plan to switch to PO tomorrow -Continue mIVF (LR at 118m/hr) until PO intake improves -Add Carafate 1g daily and Famotidine '10mg'$  daily -Monitor PO intake  Hypertension BP persistently elevated to [id number] despite IV hydralazine yesterday. -d/c hydralazine -Amlodipine '10mg'$  daily -Close monitoring of BP -Holding home enalapril due to AKI  AKI Improving- Cr 1.78 this morning from 2.43 on admission. No hx of CKD, baseline Cr 0.9. Most likely pre-renal etiology in the setting of poor PO intake over extended period of time. -IV fluids (LR @ 1262mhr) -Daily BMP  T2DM Glucose 83-142 over past 24h. Has not required any insulin since admission. A1c 6.1%. Home meds: Lantus 25u BID  -d/c sSSI -CBG monitoring q8h (can switch to qAC and qhs if eating meals) -Regular diet -Continue holding Lantus at discharge. Will need PCP follow up  HLD Home meds: atorvastatin '80mg'$  daily -Restart home Atorvastatin  Anxiety -Restart home Sertraline '50mg'$  daily  Seasonal Allergies -Flonase  daily  Hypomanesemia Mag 1.5 today. -Repleted with IV Mag 2g x1 -Repeat Mag tomorrow am  History of CVA  She has 2 CVAs in February and 2 in April in the last year.  Only persistent deficit is drooping of right side of the mouth.  Patient is not on any anticoagulation or antiplatelet medications.  FEN/GI: Regular diet PPx: Lovenox   Status is: Inpatient Remains inpatient appropriate because:Ongoing active pain requiring inpatient pain management and IV treatments appropriate due to intensity of illness or inability to take PO   Dispo: The patient is from: Home              Anticipated d/c is to: Home              Patient currently is not medically stable to d/c.   Difficult to place patient No    Subjective:  No acute events overnight. Patient reports her abdominal pain is improved this morning. Still with diarrhea and very poor appetite. No nausea or vomiting. Requesting for her home flonase and sertraline to be restarted.  Objective: Temp:  [97.6 F (36.4 C)-98.4 F (36.9 C)] 97.6 F (36.4 C) (06/03 0505) Pulse Rate:  [68-77] 74 (06/03 0607) Resp:  [14-20] 16 (06/03 0505) BP: (137-213)/(42-84) 185/68 (06/03 0607) SpO2:  [96 %-100 %] 100 % (06/03 0505) Weight:  [68.9 kg] 68.9 kg (06/02 1350) Physical Exam: General: alert, NAD Cardiovascular: RRR, normal S1/S2 without m/r/g Respiratory: normal effort, lungs CTAB Abdomen: +BS, soft, nontender, nondistended, no rebound or guarding Extremities: no peripheral edema Neuro: alert, oriented x3, no focal findings  Laboratory: Recent Labs  Lab 10/28/20 1106 10/29/20 0038  WBC 12.5* 12.6*  HGB 13.3 11.5*  HCT 42.3 36.2  PLT 328 254   Recent Labs  Lab 10/28/20 1106 10/29/20 0038 10/30/20 0436  NA 134* 136 138  K 5.3* 5.0 4.4  CL 106 108 112*  CO2 18* 19* 18*  BUN 54* 49* 35*  CREATININE 2.43* 2.04* 1.78*  CALCIUM 9.5 8.9 8.9  PROT 6.9  --   --   BILITOT 0.7  --   --   ALKPHOS 77  --   --   ALT 10  --   --    AST 10*  --   --   GLUCOSE 132* 90 105*     Imaging/Diagnostic Tests: Upper Endoscopy Impression: 1. Severe reflux esophagitis 2. Severe duodenitis. Status post biopsies 3. Otherwise unremarkable EGD    Alcus Dad, MD 10/30/2020, 6:39 AM PGY-1, [place] Intern pager: [phone], text pages welcome

## 2020-10-30 NOTE — Anesthesia Postprocedure Evaluation (Signed)
Anesthesia Post Note  Patient: Lauren Craig  Procedure(s) Performed: ESOPHAGOGASTRODUODENOSCOPY (EGD) WITH PROPOFOL (N/A ) BIOPSY     Patient location during evaluation: PACU Anesthesia Type: MAC Level of consciousness: awake and alert Pain management: pain level controlled Vital Signs Assessment: post-procedure vital signs reviewed and stable Respiratory status: spontaneous breathing, nonlabored ventilation, respiratory function stable and patient connected to nasal cannula oxygen Cardiovascular status: stable and blood pressure returned to baseline Postop Assessment: no apparent nausea or vomiting Anesthetic complications: no   No complications documented.  Last Vitals:  Vitals:   10/30/20 0731 10/30/20 1217  BP: (!) 158/82 (!) 167/100  Pulse:  82  Resp: 18 18  Temp:  36.5 C  SpO2:  100%    Last Pain:  Vitals:   10/30/20 1217  TempSrc: Oral  PainSc:                  Tiajuana Amass

## 2020-10-30 NOTE — Evaluation (Signed)
Occupational Therapy Evaluation Patient Details Name: Lauren Craig MRN: [id number] DOB: Jul 06, 1945 Today's Date: 10/30/2020    History of Present Illness Lauren Craig is a 75 y.o. female admitted 10/28/20  presenting with persistent nausea and vomiting, diarrhea.CT scan suggestive of esophagitis, possible duodenitis and concern for early appendicitis (GI does not think this is what it is). EGD 6/2: Severe reflux esophagitis, Severe duodenitis.  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 Currently she is quite weak from all that has been going home, moving slower than normal, tires more easily, needs increased time for tasks, and is at an overall setup/S-min guard A level. She will continue to benefit from acute OT with follow up [place].    Follow Up Recommendations  Home health OT;Supervision/Assistance - 24 hour    Equipment Recommendations  None recommended by OT       Precautions / Restrictions Precautions Precautions: Fall Precaution Comments: enteric precautions Restrictions Weight Bearing Restrictions: No      Mobility Bed Mobility               General bed mobility comments: in bathroom upon entry (with NT in room)    Transfers Overall transfer level: Needs assistance Equipment used: None Transfers: Sit to/from Stand Sit to Stand: Min guard         General transfer comment: increased time, no significant physical assistance given    Balance Overall balance assessment: Mild deficits observed, not formally tested                                         ADL either performed or assessed with clinical judgement   ADL Overall ADL's : Needs  assistance/impaired Eating/Feeding: Independent;Sitting   Grooming: Min guard;Wash/dry hands;Standing   Upper Body Bathing: Set up;Sitting   Lower Body Bathing: Min guard;Sit to/from stand   Upper Body Dressing : Set up;Sitting   Lower Body Dressing: Min guard;Sit to/from stand   Toilet Transfer: Min guard;Ambulation;Regular Toilet;Grab bars   Toileting- Clothing Manipulation and Hygiene: Min guard;Sit to/from stand               Vision Baseline Vision/History: Wears glasses Wears Glasses: Reading only Patient Visual Report: No change from baseline              Pertinent Vitals/Pain Pain Assessment: No/denies pain        Extremity/Trunk Assessment Upper Extremity Assessment Upper Extremity Assessment: Overall WFL for tasks assessed   Lower Extremity Assessment Lower Extremity Assessment: Generalized weakness   Cervical / Trunk Assessment Cervical / Trunk Assessment: Kyphotic   Communication Communication Communication: Expressive difficulties   Cognition Arousal/Alertness: Awake/alert Behavior During Therapy: WFL for tasks assessed/performed;Flat affect Overall Cognitive Status: Within Functional Limits for tasks assessed                                 General Comments: pleasant and cooperative              Home Living Family/patient expects to be discharged to:: Private residence Living Arrangements: Children;Other (Comment) (daughter) Available Help  at Discharge: Family;Available PRN/intermittently (daughter works as a Pharmacist, hospital, will be out for the summer) Type of Home: House Home Access: Stairs to enter [employer] of Steps: 1 [place]: Two level Alternate Level Stairs-Number of Steps: lives on main level, daughter lives downstairs   Bathroom Shower/Tub: Hospital doctor Toilet: Handicapped height     Home Equipment: [place] - built in;Grab bars - tub/shower;Wheelchair - Education administrator  (comment);Cane - single point;Walker - 2 wheels (walking stick)   Additional Comments: 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 usually works for full days with no assistive device. 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      Prior Functioning/Environment Level of Independence: Independent                 OT Problem List: Decreased strength;Impaired balance (sitting and/or standing)      OT Treatment/Interventions: Self-care/ADL training;DME and/or AE instruction;Patient/family education;Balance training    OT Goals(Current goals can be found in the care plan section) Acute Rehab OT Goals Patient Stated Goal: go home, return to work OT Goal Formulation: With patient Time For Goal Achievement: 11/13/20 Potential to Achieve Goals: Good  OT Frequency: Min 2X/week           Co-evaluation PT/OT/SLP Co-Evaluation/Treatment: Yes (partial) Reason for Co-Treatment: To address functional/ADL transfers          AM-PAC OT "6 Clicks" Daily Activity     Outcome Measure Help from another person eating meals?: None Help from another person taking care of personal grooming?: A Little Help from another person toileting, which includes using toliet, bedpan, or urinal?: A Little Help from another person bathing (including washing, rinsing, drying)?: A Little Help from another person to put on and taking off regular upper body clothing?: A Little Help from another person to put on and taking off regular lower body clothing?: A Little 6 Click Score: 19   End of Session    Activity Tolerance: Patient tolerated treatment well Patient left: in chair;with call bell/phone within reach;with chair alarm set  OT Visit Diagnosis: Unsteadiness on feet (R26.81);Muscle weakness (generalized) (M62.81)                Time: [id number] OT Time Calculation (min): 40 min Charges:  OT General Charges $OT Visit: 1 Visit OT Evaluation $OT Eval Moderate  Complexity: 1 Mod OT Treatments $Self Care/Home Management : 8-22 mins  [place], OTR/L Acute [employer] Pager [phone] Office [phone]     Almon Register 10/30/2020, 10:42 AM

## 2020-10-30 NOTE — Progress Notes (Signed)
FPTS Interim Progress Note  Patient alert and resting comfortably. Complaints of headache.  Has had similar headache in past and attributed to blood pressure. Has not been eating a lot since EGD.  No focal deficits on exam.  Rounded with primary RN.  No concerns voiced.  No orders required.  Appreciated nightly round.  Today's Vitals   10/29/20 2200 10/30/20 0017 10/30/20 0505 10/30/20 0607  BP: (!) 137/42 (!) 172/50 (!) 169/64 (!) 185/68  Pulse:   77 74  Resp:   16   Temp:   97.6 F (36.4 C)   TempSrc:   Oral   SpO2:   100%   Weight:      Height:      PainSc:       CBG 97. SBP 137/42 Hydralazine dose was increased today form 5 mg to 10 mg TID.  Would consider switching to po antihypertensive in am.  Carollee Leitz, MD [facility] Medicine Res[facility]

## 2020-10-31 LAB — BASIC METABOLIC PANEL
Anion gap: 7 (ref 5–15)
BUN: 29 mg/dL — ABNORMAL HIGH (ref 8–23)
CO2: 20 mmol/L — ABNORMAL LOW (ref 22–32)
Calcium: 8.9 mg/dL (ref 8.9–10.3)
Chloride: 108 mmol/L (ref 98–111)
Creatinine, Ser: 1.73 mg/dL — ABNORMAL HIGH (ref 0.44–1.00)
GFR, Estimated: 30 mL/min — ABNORMAL LOW (ref >60)
Glucose, Bld: 148 mg/dL — ABNORMAL HIGH (ref 70–99)
Potassium: 3.9 mmol/L (ref 3.5–5.1)
Sodium: 135 mmol/L (ref 135–145)

## 2020-10-31 LAB — GLUCOSE, CAPILLARY
Glucose-Capillary: 116 mg/dL — ABNORMAL HIGH (ref 70–99)
Glucose-Capillary: 142 mg/dL — ABNORMAL HIGH (ref 70–99)
Glucose-Capillary: 143 mg/dL — ABNORMAL HIGH (ref 70–99)
Glucose-Capillary: 173 mg/dL — ABNORMAL HIGH (ref 70–99)

## 2020-10-31 LAB — MAGNESIUM: Magnesium: 1.9 mg/dL (ref 1.7–2.4)

## 2020-10-31 MED ORDER — PANTOPRAZOLE SODIUM 40 MG PO TBEC
40.0000 mg | DELAYED_RELEASE_TABLET | Freq: Two times a day (BID) | ORAL | Status: DC
  Administered 2020-10-31 – 2020-11-01 (×2): 40 mg via ORAL
  Filled 2020-10-31 (×2): qty 1

## 2020-10-31 MED ORDER — ONDANSETRON 4 MG PO TBDP: 4.0000 mg | ORAL_TABLET | Freq: Three times a day (TID) | ORAL | Status: DC | PRN

## 2020-10-31 MED ORDER — CARVEDILOL 6.25 MG PO TABS
6.2500 mg | ORAL_TABLET | Freq: Two times a day (BID) | ORAL | Status: DC
  Administered 2020-10-31 – 2020-11-01 (×2): 6.25 mg via ORAL
  Filled 2020-10-31 (×2): qty 1

## 2020-10-31 NOTE — Progress Notes (Signed)
FPTS Interim Progress Note Patient sleeping and resting comfortably.  Rounded with primary RN. Tolerated Amlodipine well.  No concerns voiced. Marland Kitchen Appreciated nightly round.  Today's Vitals   10/30/20 2137 10/30/20 2150 10/31/20 0535 10/31/20 0550  BP: (!) 147/77  (!) 173/133 (!) 185/83  Pulse: 93  88   Resp: 20  18   Temp: (!) 97.5 F (36.4 C)  97.7 F (36.5 C)   TempSrc: Axillary  Oral   SpO2: 98%  96%   Weight:      Height:      PainSc:  0-No pain     HTN Uncontrolled.  Home medication Enalapril 40 mg. Holding in setting of increased Cr. Continue Amlodipine 10 mg daily Continue to monitor  C Diff results indeterminate. Called Lab to see if could add NAAT for gene testing to confirm.  No stool sample remaining to send sample.  -Fidaxomicin 200 mg BID x 10 days. (06/04-06/14) -Continue to monitor   Carollee Leitz, MD Family Medicine Residency

## 2020-10-31 NOTE — Progress Notes (Addendum)
Family Medicine Teaching Service Daily Progress Note Intern Pager: [phone]  Patient name: Lauren Craig Medical record number: [id number] Date of birth: [birth year] Age: 75 y.o. Gender: female  Primary Care Provider: Randel Books, FNP Consultants: GI (signed off) Code Status: Full  Pt Overview and Major Events to Date:  6/1: admitted, GI consulted 6/2: underwent EGD  Assessment and Plan:  Lauren Craig is a 75 y.o. female presenting with vomiting and abdominal pain. PMH is significant for HTN, insulin-dependent T2DM, prior CVA, and osteomyelitis of the foot.  Severe Esophagitis & Duodenitis EGD on 6/2 showed severe esophagitis and severe duodenitis. Patient feeling better- no abdominal pain or nausea this morning. Able to tolerate fair amount of PO yesterday. -GI has signed off -Change Protonix from IV to PO '40mg'$  BID -Continue Carafate 1g daily and Famotidine '10mg'$  daily -Zofran ODT '4mg'$  q8h prn -Monitor PO intake  C. Diff C. Diff studies showed positive PCR but negative toxin. Given her ongoing diarrhea will treat for C. Diff. -Fidaxomicin BID x10 days  Hypertension BP remains persistently elevated up to A999333 systolic. -Amlodipine '10mg'$  daily -Will add PO Carvedilol 6.'25mg'$  BID -Close monitoring of BP -Holding home enalapril due to AKI -If persistently elevated, consider additional workup for underlying cause (ie RTA)  AKI Improved from admission, but Cr still elevated and essentially unchanged from yesterday. Cr 1.73 this morning from 1.78 yesterday and 2.43 on admission. No known hx of CKD, baseline Cr 0.9 (1 year ago). Thought to be pre-renal etiology in the setting of poor PO intake. However she may have developed some CKD over the past 1 year due to her uncontrolled HTN as well. -d/c IV fluids -Daily BMP -Avoid nephrotoxic agents  T2DM Glucose 116-173 over past 24h. A1c 6.1%. Home meds: Lantus 25u BID  -CBG monitoring qAC and qHS -Regular diet -Holding Lantus.  Plan to hold at discharge as well. 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 Only persistent deficit is drooping of right side of the mouth.  Patient is not on any anticoagulation or antiplatelet medications.  FEN/GI: Regular diet PPx: Lovenox   Status is: Inpatient Remains inpatient appropriate because:Ongoing active pain requiring inpatient pain management and IV treatments appropriate due to intensity of illness or inability to take PO   Dispo: The patient is from: Home              Anticipated d/c is to: Home              Patient currently is not medically stable to d/c.   Difficult to place patient No    Subjective:  No acute events overnight. Patient feels well this morning. Able to tolerate some chicken broth, small amount of tilapia, and fruit yesterday. No abdominal pain, nausea, or vomiting. Still with diarrhea. Occasional headaches which resolve with Tylenol.  Objective: Temp:  [97.5 F (36.4 C)-97.7 F (36.5 C)] 97.7 F (36.5 C) (06/04 0535) Pulse Rate:  [82-93] 88 (06/04 0535) Resp:  [18-20] 18 (06/04 0535) BP: (147-185)/(77-133) 185/83 (06/04 0550) SpO2:  [96 %-100 %] 96 % (06/04 0535) Physical Exam: General: alert, NAD Cardiovascular: RRR, normal S1/S2 without m/r/g Respiratory: normal effort, lungs CTAB Abdomen: +BS, soft, nontender, nondistended, no rebound or guarding Extremities: no peripheral edema Neuro: alert, oriented x3, no focal findings  Laboratory: Recent  Labs  Lab 10/28/20 1106 10/29/20 0038  WBC 12.5* 12.6*  HGB 13.3 11.5*  HCT 42.3 36.2  PLT 328 254   Recent Labs  Lab 10/28/20 1106 10/29/20 0038 10/30/20 0436 10/31/20 0117  NA 134* 136 138 135  K 5.3* 5.0 4.4 3.9  CL 106 108 112* 108  CO2 18* 19* 18* 20*  BUN 54* 49* 35* 29*   CREATININE 2.43* 2.04* 1.78* 1.73*  CALCIUM 9.5 8.9 8.9 8.9  PROT 6.9  --   --   --   BILITOT 0.7  --   --   --   ALKPHOS 77  --   --   --   ALT 10  --   --   --   AST 10*  --   --   --   GLUCOSE 132* 90 105* 148*   CDiff PCR positive, toxin negative GIPP negative  Imaging/Diagnostic Tests: Upper Endoscopy Impression: 1. Severe reflux esophagitis 2. Severe duodenitis. Status post biopsies 3. Otherwise unremarkable EGD    Alcus Dad, MD 10/31/2020, 7:07 AM PGY-1, [place] Intern pager: [phone], text pages welcome

## 2020-11-01 LAB — BASIC METABOLIC PANEL
Anion gap: 6 (ref 5–15)
BUN: 27 mg/dL — ABNORMAL HIGH (ref 8–23)
CO2: 20 mmol/L — ABNORMAL LOW (ref 22–32)
Calcium: 8.3 mg/dL — ABNORMAL LOW (ref 8.9–10.3)
Chloride: 111 mmol/L (ref 98–111)
Creatinine, Ser: 1.75 mg/dL — ABNORMAL HIGH (ref 0.44–1.00)
GFR, Estimated: 30 mL/min — ABNORMAL LOW (ref >60)
Glucose, Bld: 163 mg/dL — ABNORMAL HIGH (ref 70–99)
Potassium: 4.1 mmol/L (ref 3.5–5.1)
Sodium: 137 mmol/L (ref 135–145)

## 2020-11-01 LAB — GLUCOSE, CAPILLARY: Glucose-Capillary: 110 mg/dL — ABNORMAL HIGH (ref 70–99)

## 2020-11-01 MED ORDER — CARVEDILOL 6.25 MG PO TABS: 6.2500 mg | ORAL_TABLET | Freq: Two times a day (BID) | ORAL | 0 refills | Status: DC

## 2020-11-01 MED ORDER — AMLODIPINE BESYLATE 10 MG PO TABS: 10.0000 mg | ORAL_TABLET | Freq: Every day | ORAL | 0 refills | Status: DC

## 2020-11-01 MED ORDER — SUCRALFATE 1 G PO TABS: 1.0000 g | ORAL_TABLET | Freq: Every day | ORAL | 0 refills | Status: DC

## 2020-11-01 MED ORDER — ACETAMINOPHEN 325 MG PO TABS
650.0000 mg | ORAL_TABLET | Freq: Four times a day (QID) | ORAL | Status: AC | PRN
Start: 2020-11-01 — End: ?

## 2020-11-01 MED ORDER — PANTOPRAZOLE SODIUM 40 MG PO TBEC: 40.0000 mg | DELAYED_RELEASE_TABLET | Freq: Two times a day (BID) | ORAL | 0 refills | Status: DC

## 2020-11-01 MED ORDER — FAMOTIDINE 10 MG PO TABS: 10.0000 mg | ORAL_TABLET | Freq: Every day | ORAL | 0 refills | Status: DC

## 2020-11-01 MED ORDER — FIDAXOMICIN 200 MG PO TABS: 200.0000 mg | ORAL_TABLET | Freq: Two times a day (BID) | ORAL | 0 refills | Status: DC

## 2020-11-01 NOTE — Discharge Instructions (Signed)
Dear Lauren Craig,   Thank you for letting us participate in your care! In this section, you will find a brief summary of why you were admitted to the hospital, what happened during your admission, your diagnosis/diagnoses, and recommended follow up.   You were admitted because you were experiencing abdominal pain and nausea.   Your testing revealed severe inflammation in your esophagus and the beginning of your small intestine.   You were diagnosed with esophagitis, duodenitis, and a C. Diff infection.  You were also seen by the GI doctors. They recommended medication to help with your symptoms and they will see you again as an outpatient.   You were also found to have very high blood pressure and some kidney disease, which your primary care doctor will continue to follow.  Your symptoms improved and you were discharged from the hospital for meeting this goal.    POST-HOSPITAL & CARE INSTRUCTIONS Several of your medications were changed. Please see medications section of this packet for details. Importantly, you should STOP taking your insulin.   DOCTOR'S APPOINTMENTS & FOLLOW UP Future Appointments  Date Time Provider [place]  11/06/2020 11:00 AM Penni Bombard, MD [facility] None  11/27/2020  3:00 PM Willia Craze, NP [facility]     Thank you for choosing [facility]! Take care and be well!  [facility]  [place], [place] [address] [phone]

## 2020-11-01 NOTE — Progress Notes (Signed)
   11/01/20 1409  AVS Discharge Documentation  AVS Discharge Instructions Including Medications Provided to patient/caregiver  Name of Person Receiving AVS Discharge Instructions Including Medications Lauren Craig  Name of Clinician That Reviewed AVS Discharge Instructions Including Medications [place]  Discharge paperwork gone over with patient Lauren Craig.  Made aware of medication changes and of future doctors appointments. Patients questions were answered. Patient then taken down to daughter who is picking her up to take her home.

## 2020-11-01 NOTE — Discharge Summary (Signed)
[facility] Discharge Summary  Patient name: Lauren Craig Medical record number: [id number] Date of birth: 11-02-1945 Age: 75 y.o. Gender: female Date of Admission: 10/28/2020  Date of Discharge: 11/01/2020 Admitting Physician: Martyn Malay, MD  Primary Care Provider: Randel Books, FNP Consultants: GI  Indication for Hospitalization: abdominal pain, N/V, AKI  Discharge Diagnoses/Problem List:  Severe duodenitis Severe esophagitis C. Difficile Hypertension AKI on CKD T2DM HLD Anxiety Hx of CVA  Disposition: Home  Discharge Condition: Improved  Discharge Exam:  Gen: Alert, well appearing, NAD CV: RRR, normal S1/S2, no m/r/g Resp: Normal effort, lungs CTAB GI: Normoactive BS, soft, nontender, nondistended MSK: No peripheral edema Neuro: Alert, oriented x4, grossly intact Psych: Appropriate affect, very appreciative of her care  Brief Hospital Course:   Toccora Harlanis a 75 y.o.femalewho presented with vomiting and abdominal pain. PMH is significant forHTN, insulin-dependent T2DM, prior CVA, and osteomyelitis of the foot.  Severe Esophagitis & Duodenitis Patient presented with vomiting, abdominal pain and 40lb weight loss over the past 1 year.  EGD on 6/2 showed severe esophagitis and severe duodenitis.  She was started on Protonix 40 mg BID as well as Carafate and Famotidine (at reduced doses due to kidney function).  Her symptoms improved and she was able to tolerate adequate PO. Patient has outpatient GI follow-up scheduled.  C. Diff Patient also with diarrhea during her hospitalization. C. Diff PCR positive but toxin negative. She was treated with Fidoxamicin x10 days in light of her symptoms.  HTN BP persistently elevated up to A999333 systolic during admission. Her home Enalapril was held due to AKI and she was initially treated w/IV labetalol and hydralazine. Once tolerating PO, she was started on Amlodipine and Coreg. Manual BP  140/56 on day of discharge.  AKI (on newly diagnosed CKD) Patient's Cr 2.43 on admission. No known hx of CKD and most recent Cr from 1 year ago was 0.9. Thought to be pre-renal etiology in the setting of vomiting/poor PO intake although Cr only improved to 1.7 with IV fluids. 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   T2DM A1c 6.1% on admission. Lantus was discontinued (previously taking 25u BID).  Issues for Follow Up:  1. Address BP. Consider increasing dose of Coreg 2. Recommend repeat BMP to monitor kidney function 3. Lantus was discontinued. Evaluate need for DM medication in the future. 4. Patient would like outpatient referrals for SLP and podiatry   Significant Procedures: EGD on 6/2  Significant Labs and Imaging:  Recent Labs  Lab 10/28/20 1106 10/29/20 0038  WBC 12.5* 12.6*  HGB 13.3 11.5*  HCT 42.3 36.2  PLT 328 254   Recent Labs  Lab 10/28/20 1106 10/29/20 0038 10/30/20 0436 10/31/20 0117 11/01/20 0107  NA 134* 136 138 135 137  K 5.3* 5.0 4.4 3.9 4.1  CL 106 108 112* 108 111  CO2 18* 19* 18* 20* 20*  GLUCOSE 132* 90 105* 148* 163*  BUN 54* 49* 35* 29* 27*  CREATININE 2.43* 2.04* 1.78* 1.73* 1.75*  CALCIUM 9.5 8.9 8.9 8.9 8.3*  MG  --   --  1.5* 1.9  --   PHOS  --   --  3.5  --   --   ALKPHOS 77  --   --   --   --   AST 10*  --   --   --   --   ALT 10  --   --   --   --  ALBUMIN 3.3*  --   --   --   --     Results/Tests Pending at Time of Discharge: None  Discharge Medications:  Allergies as of 11/01/2020      Reactions   Penicillins Hives, Rash, Other (See Comments)   Did it involve swelling of the face/tongue/throat, SOB, or low BP? Yes Did it involve sudden or severe rash/hives, skin peeling, or any reaction on the inside of your mouth or nose? No Did you need to seek medical attention at a hospital or doctor's office? Yes When did it last happen?? If all above answers are "NO", may proceed with  cephalosporin use.   Insulin Lispro Itching, Other (See Comments)   Site of injection was inflamed, red, painful episode   Insulin Lispro Prot & Lispro Other (See Comments)   Site of injection was inflamed, red, painful episode   Semaglutide Nausea And Vomiting   Metformin Diarrhea      Medication List    STOP taking these medications   enalapril 20 MG tablet Commonly known as: VASOTEC   insulin glargine 100 UNIT/ML Solostar Pen Commonly known as: LANTUS   promethazine 12.5 MG tablet Commonly known as: PHENERGAN   traMADol 50 MG tablet Commonly known as: ULTRAM     TAKE these medications   acetaminophen 325 MG tablet Commonly known as: TYLENOL Take 2 tablets (650 mg total) by mouth every 6 (six) hours as needed for headache.   albuterol 108 (90 Base) MCG/ACT inhaler Commonly known as: VENTOLIN HFA Inhale 2 puffs into the lungs every 6 (six) hours as needed for wheezing or shortness of breath.   amLODipine 10 MG tablet Commonly known as: NORVASC Take 1 tablet (10 mg total) by mouth daily.   atorvastatin 40 MG tablet Commonly known as: LIPITOR Take 40 mg by mouth daily.   carvedilol 6.25 MG tablet Commonly known as: COREG Take 1 tablet (6.25 mg total) by mouth 2 (two) times daily with a meal.   famotidine 10 MG tablet Commonly known as: PEPCID Take 1 tablet (10 mg total) by mouth daily.   fidaxomicin 200 MG Tabs tablet Commonly known as: DIFICID Take 1 tablet (200 mg total) by mouth 2 (two) times daily for 9 days.   fluticasone 50 MCG/ACT nasal spray Commonly known as: FLONASE Place into both nostrils.   ondansetron 4 MG tablet Commonly known as: ZOFRAN Take 4 mg by mouth daily as needed for nausea or vomiting.   pantoprazole 40 MG tablet Commonly known as: PROTONIX Take 1 tablet (40 mg total) by mouth 2 (two) times daily.   sertraline 50 MG tablet Commonly known as: ZOLOFT Take 50 mg by mouth daily.   sucralfate 1 g tablet Commonly known as:  CARAFATE Take 1 tablet (1 g total) by mouth daily before supper.       Discharge Instructions: Please refer to Patient Instructions section of EMR for full details.  Patient was counseled important signs and symptoms that should prompt return to medical care, changes in medications, dietary instructions, activity restrictions, and follow up appointments.   Follow-Up Appointments: Future Appointments  Date Time Provider [place]  11/06/2020 11:00 AM Penni Bombard, MD [facility] None  11/09/2020  9:10 AM Martyn Malay, MD [facility]  11/27/2020  3:00 PM Willia Craze, NP [facility] Bertrum Sol, MD 11/01/2020, 11:56 AM PGY-1, [place]

## 2020-11-02 ENCOUNTER — Encounter (HOSPITAL_COMMUNITY): Payer: Self-pay | Admitting: Internal Medicine

## 2020-11-02 LAB — SURGICAL PATHOLOGY

## 2020-11-03 ENCOUNTER — Encounter: Payer: Self-pay | Admitting: *Deleted

## 2020-11-05 ENCOUNTER — Telehealth: Payer: Self-pay

## 2020-11-05 NOTE — Telephone Encounter (Signed)
[facility] RN LVM on nurse line stating the patient has not been able to pick up Dificid. Lauren Craig reports the medication in unaffordable for the patient. Lauren Craig is suggesting a cheaper alternative and mentioned Vancomycin. Please advise on change. Patient has an apt with [facility] on 6/13.  I attempted to call patient and nurse, however no answer. I am not sure which pharmacy.

## 2020-11-06 ENCOUNTER — Encounter: Payer: Self-pay | Admitting: Diagnostic Neuroimaging

## 2020-11-06 ENCOUNTER — Telehealth: Payer: Self-pay

## 2020-11-06 ENCOUNTER — Ambulatory Visit: Payer: PPO | Admitting: Diagnostic Neuroimaging

## 2020-11-06 VITALS — BP 190/67 | HR 71 | Ht 65.0 in | Wt 155.0 lb

## 2020-11-06 DIAGNOSIS — K2210 Ulcer of esophagus without bleeding: Secondary | ICD-10-CM | POA: Diagnosis not present

## 2020-11-06 DIAGNOSIS — E1165 Type 2 diabetes mellitus with hyperglycemia: Secondary | ICD-10-CM

## 2020-11-06 DIAGNOSIS — I6340 Cerebral infarction due to embolism of unspecified cerebral artery: Secondary | ICD-10-CM | POA: Diagnosis not present

## 2020-11-06 DIAGNOSIS — E785 Hyperlipidemia, unspecified: Secondary | ICD-10-CM | POA: Diagnosis not present

## 2020-11-06 DIAGNOSIS — K2980 Duodenitis without bleeding: Secondary | ICD-10-CM

## 2020-11-06 DIAGNOSIS — I159 Secondary hypertension, unspecified: Secondary | ICD-10-CM | POA: Diagnosis not present

## 2020-11-06 NOTE — Progress Notes (Signed)
[facility]  PATIENT: Lauren Craig DOB: 04-06-1946  REFERRING CLINICIAN: Noah Delaine, MD HISTORY FROM: patient  REASON FOR VISIT: new consult    HISTORICAL  CHIEF COMPLAINT:  Chief Complaint  Patient presents with   New Patient (Initial Visit)    Rm 6, alone, states BP has been very elevated, reports no lightheadedness, dizziness or numbness     HISTORY OF PRESENT ILLNESS:   75 year old female here for evaluation of stroke.  History of hypertension, hyperlipidemia, diabetes, peripheral vascular disease.  February 2022 patient had visual disturbance and nausea, went to [facility] and was diagnosed with acute and subacute ischemic infarcts on the right side.  Full hospital records not available today.  Patient returned to [facility] in April 2022 for right-sided weakness and was diagnosed with left internal capsule ischemic infarct.  Also with multifocal intracranial atherosclerosis as previously noted.  Full hospital records not available today.  Apparently she was told to start aspirin 81 mg daily but she had been inconsistent with this.  Patient also been struggling with chronic nausea and abdominal pain for past 1 year and was ultimately admitted to [facility] in June 2022 diagnosed with severe esophagitis and duodenitis, likely related to peptic ulcer disease and gastroparesis.  Overall patient doing well from stroke symptoms and feels like she is fully recovered.   REVIEW OF SYSTEMS: Full 14 system review of systems performed and negative with exception of: as per HPI.    ALLERGIES: Allergies  Allergen Reactions   Penicillins Hives, Rash and Other (See Comments)    Did it involve swelling of the face/tongue/throat, SOB, or low BP? Yes Did it involve sudden or severe rash/hives, skin peeling, or any reaction on the inside of your mouth or nose? No Did you need to seek medical attention at a hospital or doctor's office?  Yes When did it last happen? ? If all above answers are "NO", may proceed with cephalosporin use.    Insulin Lispro Itching and Other (See Comments)    Site of injection was inflamed, red, painful episode   Insulin Lispro Prot & Lispro Other (See Comments)    Site of injection was inflamed, red, painful episode   Semaglutide Nausea And Vomiting   Metformin Diarrhea    HOME MEDICATIONS: Outpatient Medications Prior to Visit  Medication Sig Dispense Refill   acetaminophen (TYLENOL) 325 MG tablet Take 2 tablets (650 mg total) by mouth every 6 (six) hours as needed for headache.     albuterol (PROVENTIL HFA;VENTOLIN HFA) 108 (90 Base) MCG/ACT inhaler Inhale 2 puffs into the lungs every 6 (six) hours as needed for wheezing or shortness of breath.     amLODipine (NORVASC) 10 MG tablet Take 1 tablet (10 mg total) by mouth daily. 90 tablet 0   atorvastatin (LIPITOR) 40 MG tablet Take 40 mg by mouth daily.     carvedilol (COREG) 6.25 MG tablet Take 1 tablet (6.25 mg total) by mouth 2 (two) times daily with a meal. 60 tablet 0   famotidine (PEPCID) 10 MG tablet Take 1 tablet (10 mg total) by mouth daily. 30 tablet 0   fidaxomicin (DIFICID) 200 MG TABS tablet Take 1 tablet (200 mg total) by mouth 2 (two) times daily for 9 days. 18 tablet 0   fluticasone (FLONASE) 50 MCG/ACT nasal spray Place into both nostrils.     ondansetron (ZOFRAN) 4 MG tablet Take 4 mg by mouth daily as needed for nausea or vomiting.  pantoprazole (PROTONIX) 40 MG tablet Take 1 tablet (40 mg total) by mouth 2 (two) times daily. 60 tablet 0   sertraline (ZOLOFT) 50 MG tablet Take 50 mg by mouth daily.     sucralfate (CARAFATE) 1 g tablet Take 1 tablet (1 g total) by mouth daily before supper. 30 tablet 0   No facility-administered medications prior to visit.    PAST MEDICAL HISTORY: Past Medical History:  Diagnosis Date   Anxiety disorder    Arthritis    hands, hips   Essential (primary) hypertension 1990   Mixed  hyperlipidemia 2010   Osteomyelitis ([place])    left 5th toe   PVD (peripheral vascular disease) (HCC)    Seasonal allergies    Stroke (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    PAST SURGICAL HISTORY: Past Surgical History:  Procedure Laterality Date   ABDOMINAL AORTOGRAM W/LOWER EXTREMITY N/A 08/11/2017   Procedure: ABDOMINAL AORTOGRAM W/LOWER EXTREMITY;  Surgeon: Angelia Mould, MD;  Location: [facility];  Service: Cardiovascular;  Laterality: N/A;   ABDOMINAL HYSTERECTOMY     AMPUTATION Left 11/14/2019   Procedure: Left Fifth ray amputation;  Surgeon: Wylene Simmer, MD;  Location: [facility];  Service: Orthopedics;  Laterality: Left;   AMPUTATION TOE Left 05/17/2018   Procedure: 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;  Surgeon: Wylene Simmer, MD;  Location: [facility];  Service: Orthopedics;  Laterality: Left;   BIOPSY  10/29/2020   Procedure: BIOPSY;  Surgeon: Irene Shipper, MD;  Location: [facility] ENDOSCOPY;  Service: Endoscopy;;   ESOPHAGOGASTRODUODENOSCOPY (EGD) WITH PROPOFOL N/A 10/29/2020   Procedure: ESOPHAGOGASTRODUODENOSCOPY (EGD) WITH PROPOFOL;  Surgeon: Irene Shipper, MD;  Location: [facility] ENDOSCOPY;  Service: Endoscopy;  Laterality: N/A;   PARTIAL HYMENECTOMY  1968   TONSILLECTOMY AND ADENOIDECTOMY     VAGINAL HYSTERECTOMY  1987    FAMILY HISTORY: Family History  Problem Relation Age of Onset   Diabetes Mellitus II Mother    Hypertension Father    Heart Problems Brother        pacemaker   Diabetes Mellitus II Brother    Bipolar disorder Brother     SOCIAL HISTORY: Social History   Socioeconomic History   Marital status: Divorced    Spouse name: Not on file   Number of children: 2   Years of education: Not on file   Highest education level: Not on file  Occupational History   Occupation: substitute Product manager: [employer]   Occupation: Retired Scientist, 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  occasional   Drug use: No   Sexual activity: Not Currently  Other Topics Concern   Not on file  Social History Narrative   Lives with daughter   Marciano Sequin 1 a day   Social Determinants of Health   Financial Resource Strain: Not on file  Food Insecurity: Not on file  Transportation  Needs: Not on file  Physical Activity: Not on file  Stress: Not on file  Social Connections: Not on file  Intimate Partner Violence: Not on file     PHYSICAL EXAM  GENERAL EXAM/CONSTITUTIONAL: Vitals:  Vitals:   11/06/20 1036  BP: (!) 190/67  Pulse: 71  Weight: 155 lb (70.3 kg)  Height: '5\' 5"'$  (1.651 m)   Body mass index is 25.79 kg/m. Wt Readings from Last 3 Encounters:  11/06/20 155 lb (70.3 kg)  10/29/20 152 lb (68.9 kg)  11/14/19 187 lb 9.8 oz (85.1 kg)   Patient is in no distress; well developed, nourished and groomed; neck is supple  CARDIOVASCULAR: Examination of carotid arteries is normal; no carotid bruits Regular rate and rhythm, no murmurs Examination of peripheral vascular system by observation and palpation is normal  EYES: Ophthalmoscopic exam of optic discs and posterior segments is normal; no papilledema or hemorrhages No results found.  MUSCULOSKELETAL: Gait, strength, tone, movements noted in Neurologic exam below  NEUROLOGIC: MENTAL STATUS:  No flowsheet data found. awake, alert, oriented to person, place and time recent and remote memory intact normal attention and concentration language fluent, comprehension intact, naming intact fund of  knowledge appropriate  CRANIAL NERVE:  2nd - no papilledema on fundoscopic exam 2nd, 3rd, 4th, 6th - pupils equal and reactive to light, visual fields full to confrontation except LEFT INFERIOR QUADRANTANOPSIA; extraocular muscles intact, no nystagmus 5th - facial sensation symmetric 7th - facial strength --> DECR RIGHT LOWER FACIAL STRENGTH 8th - hearing intact 9th - palate elevates symmetrically, uvula midline 11th - shoulder shrug symmetric 12th - tongue protrusion midline  MOTOR:  normal bulk and tone, full strength in the BUE, BLE  SENSORY:  normal and symmetric to light touch, temperature, vibration  COORDINATION:  finger-nose-finger, fine finger movements normal  REFLEXES:  deep tendon reflexes TRACE and symmetric  GAIT/STATION:  SLOW AND CAUTIOUS     DIAGNOSTIC DATA (LABS, IMAGING, TESTING) - I reviewed patient records, labs, notes, testing and imaging myself where available.  Lab Results  Component Value Date   WBC 12.6 (H) 10/29/2020   HGB 11.5 (L) 10/29/2020   HCT 36.2 10/29/2020   MCV 81.9 10/29/2020   PLT 254 10/29/2020      Component Value Date/Time   NA 137 11/01/2020 0107   K 4.1 11/01/2020 0107   CL 111 11/01/2020 0107   CO2 20 (L) 11/01/2020 0107   GLUCOSE 163 (H) 11/01/2020 0107   BUN 27 (H) 11/01/2020 0107   CREATININE 1.75 (H) 11/01/2020 0107   CALCIUM 8.3 (L) 11/01/2020 0107   PROT 6.9 10/28/2020 1106   ALBUMIN 3.3 (L) 10/28/2020 1106   AST 10 (L) 10/28/2020 1106   ALT 10 10/28/2020 1106   ALKPHOS 77 10/28/2020 1106   BILITOT 0.7 10/28/2020 1106   GFRNONAA 30 (L) 11/01/2020 0107   GFRAA >60 11/11/2019 1200   No results found for: CHOL, HDL, LDLCALC, LDLDIRECT, TRIG, CHOLHDL Lab Results  Component Value Date   HGBA1C 6.1 (H) 10/29/2020   No results found for: VITAMINB12 No results found for: TSH   07/14/20  MRI brain / MRA head / neck [I reviewed images myself and agree with interpretation. -VRP]  1. Acute/subacute infarct  involving the posterior limb of the internal capsule extending along the periventricular white matter of the right temporal horn.  2. Focus of increased signal on the diffusion-weighted images within the body of the corpus callosum on the right, without corresponding low signal on the  ADC map. This may represent a subacute infarct.  3. Mild chronic microangiopathic changes with old lacunar infarct in the right thalamus and right cerebellar hemisphere.  4. No evidence of hemodynamically significant flow-limiting stenosis or proximal branch occlusion in the intracranial or neck.  5. A 2 mm inferiorly projecting outpouching from the cavernous segment of the left ICA (extradural) may represent a small aneurysm.  6. A 2 mm outpouching projecting inferiorly from the undersurface of the supraclinoid left ICA may represent a small aneurysm versus infundibulum at the origin of a fetal PCA.    09/18/20 [I reviewed images myself and agree with interpretation. -VRP]  MRI brain:   1. Motion degraded examination.  2. 10 mm acute infarct within the left caudate nucleus.  3. 8 mm acute infarct within the posterior limb of left internal capsule/lentiform nucleus.  4. Stable background generalized parenchymal atrophy and chronic ischemic disease with multiple chronic infarcts, as detailed.  5. Bilateral mastoid effusions    MRA head:   1. No intracranial large vessel occlusion.  2. Unchanged intracranial atherosclerotic disease with multifocal stenoses, most notably as follows.  3. Moderate stenosis within the right PCA at the P3/P4 junction.  4. Sites of apparent severe stenosis within the P4 right PCA.  5. Mild/moderate stenosis within the P2 left PCA.  6. Unchanged 2 mm inferiorly projecting vascular protrusion arising from the cavernous left ICA, which may reflect a small aneurysm.  7. Unchanged 2 mm inferiorly projecting vascular protrusion arising from the supraclinoid left ICA, which may reflect an  aneurysm or infundibulum at the origin of the left posterior communicating artery.  8. Unchanged 1-2 mm inferiorly projecting vascular protrusion arising from the cavernous right ICA, which may reflect a small aneurysm.    MRA neck:   1. Common carotid and internal carotid arteries patent within the neck without hemodynamically significant stenosis. Mild atherosclerotic disease bout the carotid bifurcations bilaterally.  2. Vertebral arteries codominant and patent within the neck with antegrade flow. Redemonstrated sites of apparent severe stenosis within the V1 right vertebral artery. Apparent moderate/severe stenosis within the V1 left vertebral artery (although this apparent stenosis may be accentuated by motion artifact on the current exam).    ASSESSMENT AND PLAN  75 y.o. year old female here with hypertension, hyperlipidemia, diabetes, with multiple bilateral strokes in February and April 2022.  Findings could be related to multifocal intracranial atherosclerosis.  We will request hospital records and consider additional cardioembolic work-up.  Also with recent esophagitis and duodenitis, has GI follow-up pending.  Will request GI recommendations on safety to use antithrombotic treatment such as aspirin or Plavix.  Dx:  1. Cerebrovascular accident (CVA) due to embolism of cerebral artery ([place])   2. Uncontrolled type 2 diabetes mellitus with hyperglycemia ([place])   3. Secondary hypertension   4. Hyperlipidemia, unspecified hyperlipidemia type   5. Duodenitis   6. Erosive esophagitis       PLAN:  STROKE (? Embolic from artery-artery embolism vs cardio-embolic source) - continue BP, lipid and diabetes control per PCP - follow up with GI to see if ok to use aspirin or plavix given severe esophagitis and duodenitis - will request [facility] records for review; may need to consider TEE and implanted loop recorder  Return in about 3 months (around 02/06/2021).    Penni Bombard, MD [id number], [id number] AM Certified in Neurology, Neurophysiology and Neuroimaging  [facility] [address] [place], [place] [address] [phone]

## 2020-11-06 NOTE — Telephone Encounter (Signed)
Patient completed and signed medical records request.  Dr. Leta Baptist is requesting discharge records from patient's hospitalization in February 2022 and of April 2022.

## 2020-11-06 NOTE — Telephone Encounter (Signed)
Spoke with patient. She was asked to pay 1100 up front and insurance would pay 1000. Diarrhea has resolved. Will not send in vancomycin. Patient will follow up 6/13.   Gaelle Adriance Autry-Lott, DO 11/06/2020, 7:53 AM PGY-2, [place]

## 2020-11-08 ENCOUNTER — Encounter: Payer: Self-pay | Admitting: Family Medicine

## 2020-11-08 DIAGNOSIS — H5340 Unspecified visual field defects: Secondary | ICD-10-CM | POA: Insufficient documentation

## 2020-11-08 DIAGNOSIS — I679 Cerebrovascular disease, unspecified: Secondary | ICD-10-CM | POA: Insufficient documentation

## 2020-11-08 DIAGNOSIS — I63512 Cerebral infarction due to unspecified occlusion or stenosis of left middle cerebral artery: Secondary | ICD-10-CM | POA: Insufficient documentation

## 2020-11-09 ENCOUNTER — Encounter: Payer: Self-pay | Admitting: Family Medicine

## 2020-11-09 ENCOUNTER — Other Ambulatory Visit: Payer: Self-pay

## 2020-11-09 ENCOUNTER — Ambulatory Visit (INDEPENDENT_AMBULATORY_CARE_PROVIDER_SITE_OTHER): Payer: PPO | Admitting: Family Medicine

## 2020-11-09 VITALS — BP 141/65 | HR 65 | Ht 65.0 in | Wt 174.2 lb

## 2020-11-09 DIAGNOSIS — I679 Cerebrovascular disease, unspecified: Secondary | ICD-10-CM | POA: Diagnosis not present

## 2020-11-09 DIAGNOSIS — F39 Unspecified mood [affective] disorder: Secondary | ICD-10-CM | POA: Diagnosis not present

## 2020-11-09 DIAGNOSIS — A0472 Enterocolitis due to Clostridium difficile, not specified as recurrent: Secondary | ICD-10-CM | POA: Diagnosis not present

## 2020-11-09 DIAGNOSIS — E118 Type 2 diabetes mellitus with unspecified complications: Secondary | ICD-10-CM

## 2020-11-09 DIAGNOSIS — E538 Deficiency of other specified B group vitamins: Secondary | ICD-10-CM | POA: Diagnosis not present

## 2020-11-09 DIAGNOSIS — I159 Secondary hypertension, unspecified: Secondary | ICD-10-CM | POA: Diagnosis not present

## 2020-11-09 DIAGNOSIS — R7989 Other specified abnormal findings of blood chemistry: Secondary | ICD-10-CM | POA: Diagnosis not present

## 2020-11-09 DIAGNOSIS — H9203 Otalgia, bilateral: Secondary | ICD-10-CM | POA: Diagnosis not present

## 2020-11-09 MED ORDER — SERTRALINE HCL 50 MG PO TABS: 50.0000 mg | ORAL_TABLET | Freq: Two times a day (BID) | ORAL | 3 refills | Status: DC

## 2020-11-09 MED ORDER — VANCOMYCIN HCL 125 MG PO CAPS: 125.0000 mg | ORAL_CAPSULE | Freq: Four times a day (QID) | ORAL | 0 refills | Status: AC

## 2020-11-09 NOTE — Progress Notes (Signed)
SUBJECTIVE:   CHIEF COMPLAINT:hospital follow up, medication issues (cost)  HPI:   Lauren Craig is a 75 y.o. yo with history notable for type 2 diabetes, cerebrovascular disease, recent diagnosis of esophagitis, duodenitis and C. difficile colitis presenting for hospital follow-up and discussed medications.  The patient is joined by her daughter today.  They report overall the patient is doing well.  The patient has no specific concerns today.  Diabetes The patient reports she has been monitoring her blood sugar.  She has been very strict about what she has been eating.  She drinks only lemon water and has been limiting sweets.  She denies polyuria polydipsia.  14-day average of blood glucose are 160.  Her morning sugar this morning was 141 and last A1c was 6.1.  History of CVA The patient reports some gait instability and at times difficulty with words.  Her daughter is interested in speech therapy.  They deny falls, new changes in speech, weakness, numbness.  She does endorse some mild dizziness with standing today.  She denies changes in vision, dizziness with turning her head, headache, or other symptoms.  She denies nausea or vomiting.  C. difficile colitis The patient had a positive PCR and positive antigen test on admission to the hospital for C. difficile.  The fidaxomicin was not covered by her insurance.  She does endorse some very mild nausea but specifically denies vomiting or diarrhea.  She denies melena or hematochezia.    PERTINENT  PMH / PSH/Family/Social History : Updated history tabs and problem list    Social history The patient has been working full-time as a Oceanographer until recently.  She has had a recent stressful event over the custody of her great-grandchildren.  Her granddaughter Gillie Manners was in a custody battle.  There is a criminal case pending.  This is caused significant stress Former smoker-- quit in 2004.     OBJECTIVE:   BP (!) 141/65    Pulse 65   Ht '5\' 5"'$  (1.651 m)   Wt 174 lb 3.2 oz (79 kg)   SpO2 96%   BMI 28.99 kg/m   Today's weight:  Last Weight  Most recent update: 11/09/2020  9:29 AM    Weight  79 kg (174 lb 3.2 oz)            Review of prior weights: [employer]   11/09/20 0928  Weight: 174 lb 3.2 oz (79 kg)   Neuro: CN II: PERRL CN III, IV,VI: EOMI CV V: Normal sensation in V1, V2, V3 CVII: + mild facial droop on r (baseline)  CN VIII: slightly diminished hearing  CN IX,X: Symmetric palate raise  CN XI: 5/5 shoulder shrug CN XII: Symmetric tongue protrusion  UE and LE strength 5/5 Normal sensation in UE and LE bilaterally  No ataxia with finger to nose No nystagmus Endorses mild dizziness upon standing (described as feeling unsteady)    Cardiac: Regular rate and rhythm. Normal S1/S2. No murmurs, rubs, or gallops appreciated. Lungs: Clear bilaterally to ascultation.  Abdomen: Normoactive bowel sounds. No tenderness to deep or light palpation. No rebound or guarding.   Psych: Pleasant and appropriate    ASSESSMENT/PLAN:   Controlled diabetes mellitus type 2 with complications ([place]) Congratulated on dietary changes.  Morning blood sugars are around 140s 14-day average is 160.  Given her significant weight loss and side effects of multiple medications we will continue with dietary changes at this time.  She will be due for an  A1c in late August.  HTN (hypertension) Currently at goal.  Monitor for side effects of hypotension.  Cerebrovascular disease Given the fact that she has had a change in her functional status over the past few months referral to physical therapy as well as speech-language pathology for further evaluation. At follow up:  - Cardiology for Zio patch  - Referral for Sleep Study  Follow-up ensure she has follow-up with neurology.   Orthostasis, mild, ongoing, no falls, encouraged PO hydration, may have to reduce Coreg in future if persists.   Mood Disorder Refilled  Zoloft, reports she is doing okay.   C. Difficile, cost prohibitive Difcid, Rx vancomycin QID X 10 days   Vitamin B12 deficiency, level today.   HCM Will request records and update as appropriate - Discuss COVID booster, Pneumonia vaccines, Hep C screening, diabetic eye exam, mammograms (if past normal) at follow up  Scheduled for close follow up for HCM Items  Also need to discuss Cardiology (Zio) and Sleep Study (per discharge summary)    Dorris Singh, [place]

## 2020-11-09 NOTE — Assessment & Plan Note (Addendum)
Given the fact that she has had a change in her functional status over the past few months referral to physical therapy as well as speech-language pathology for further evaluation. At follow up:  - Cardiology for Zio patch  - Referral for Sleep Study  Follow-up ensure she has follow-up with neurology.

## 2020-11-09 NOTE — Assessment & Plan Note (Signed)
Congratulated on dietary changes.  Morning blood sugars are around 140s 14-day average is 160.  Given her significant weight loss and side effects of multiple medications we will continue with dietary changes at this time.  She will be due for an A1c in late August.

## 2020-11-09 NOTE — Patient Instructions (Signed)
It was wonderful to see you today.  Please bring ALL of your medications with you to every visit.   Today we talked about:  --Going to the lab for blood work  - Call me if your dizziness changes  --I sent in refills of your medication (Zoloft)  - Take Vancomycin four times a day for 10 days--call me if too costly   Thank you for choosing [facility].   Please call [phone] with any questions about today's appointment.  I call with blood work results and give you the records in July   Dorris Singh, MD  [facility]

## 2020-11-09 NOTE — Assessment & Plan Note (Signed)
Currently at goal.  Monitor for side effects of hypotension.

## 2020-11-09 NOTE — Telephone Encounter (Signed)
Received medical records form [facility] hospital re: Discharge summary. Placed on MD desk for review.

## 2020-11-10 LAB — BASIC METABOLIC PANEL
BUN/Creatinine Ratio: 10 — ABNORMAL LOW (ref 12–28)
BUN: 16 mg/dL (ref 8–27)
CO2: 22 mmol/L (ref 20–29)
Calcium: 8.6 mg/dL — ABNORMAL LOW (ref 8.7–10.3)
Chloride: 106 mmol/L (ref 96–106)
Creatinine, Ser: 1.61 mg/dL — ABNORMAL HIGH (ref 0.57–1.00)
Glucose: 157 mg/dL — ABNORMAL HIGH (ref 65–99)
Potassium: 4.9 mmol/L (ref 3.5–5.2)
Sodium: 140 mmol/L (ref 134–144)
eGFR: 33 mL/min/{1.73_m2} — ABNORMAL LOW (ref >59)

## 2020-11-10 LAB — CBC
Hematocrit: 31.2 % — ABNORMAL LOW (ref 34.0–46.6)
Hemoglobin: 9.9 g/dL — ABNORMAL LOW (ref 11.1–15.9)
MCH: 26.1 pg — ABNORMAL LOW (ref 26.6–33.0)
MCHC: 31.7 g/dL (ref 31.5–35.7)
MCV: 82 fL (ref 79–97)
Platelets: 218 10*3/uL (ref 150–450)
RBC: 3.79 x10E6/uL (ref 3.77–5.28)
RDW: 14.9 % (ref 11.7–15.4)
WBC: 8.2 10*3/uL (ref 3.4–10.8)

## 2020-11-10 LAB — VITAMIN B12: Vitamin B-12: 418 pg/mL (ref 232–1245)

## 2020-11-11 ENCOUNTER — Telehealth: Payer: Self-pay | Admitting: Family Medicine

## 2020-11-11 NOTE — Telephone Encounter (Signed)
Attempted to call patient with results.  Left generic voicemail.  Hemoglobin is down slightly, will need to repeat at follow-up.  Will discuss bleeding with her.  She denied any at her visit.  Creatinine is stable.  Will need ferritin, smear, iron panel at follow-up.  B12 is normal---repeat in 8-12 weeks.  Dorris Singh, MD  Family Medicine Teaching Service

## 2020-11-12 ENCOUNTER — Telehealth: Payer: Self-pay | Admitting: Family Medicine

## 2020-11-12 ENCOUNTER — Encounter: Payer: Self-pay | Admitting: Family Medicine

## 2020-11-12 NOTE — Addendum Note (Signed)
Addended by: Owens Shark, Martel Galvan on: 11/12/2020 08:38 AM   Modules accepted: Level of Service

## 2020-11-12 NOTE — Telephone Encounter (Signed)
Attempted to call patient again with results.  Anemia- has GI follow up, will need evaluation at follow up.   Will send letter.  Dorris Singh, MD  Family Medicine Teaching Service

## 2020-11-13 ENCOUNTER — Telehealth: Payer: Self-pay | Admitting: Family Medicine

## 2020-11-13 NOTE — Telephone Encounter (Signed)
Attempted to call. Letter sent.   Dorris Singh, MD  Family Medicine Teaching Service

## 2020-11-16 ENCOUNTER — Telehealth: Payer: Self-pay

## 2020-11-16 NOTE — Telephone Encounter (Signed)
Please call patient and help to schedule visit this week---this is unlikely to be related to vancomycin.   Dorris Singh, MD  Family Medicine Teaching Service

## 2020-11-16 NOTE — Telephone Encounter (Signed)
Called patient, no answer. Left VM requesting that patient return call to office to schedule appointment.   Talbot Grumbling, RN

## 2020-11-16 NOTE — Telephone Encounter (Signed)
Received phone call from RN case manager at [facility] regarding patient. Reports that patient has been having swelling in feet, where she was unable to put shoes on yesterday.   Called patient. Patient reports swelling in feet starting yesterday. Patient denies swelling in legs, arms, face. Patient denies pain or redness. Patient reports that she has had this side effect to medications in the past and is unsure if this is a reaction to antibiotic. Patient denies shortness or breath or difficulty breathing and is able to speak in complete sentences.   Patient reports normal blood sugars as well.   Advised of supportive measures. Please advise additional recommendations.   Talbot Grumbling, RN

## 2020-11-18 NOTE — Patient Instructions (Addendum)
It was a pleasure to see you today!  For your leg swelling: Keep your feet elevated if you are at home. Wear compression stockings to help reduce swelling when out and about. Limit the amount of salt you add to your food. For blood pressure: start taking indapamide (lozol) 1.25 mg daily. Take this in the morning. If you have any signs of low blood pressure (dizziness, feeling unsteady especially when standing or changing position), please give our office a call [phone]. We will get some labs today.  If they are abnormal or we need to do something about them, I will call you.  If they are normal, I will send you a message on MyChart (if it is active) or a letter in the mail.  If you don't hear from [place] in 2 weeks, please call the office  [phone]. Follow up with Dr. Owens Shark on July 18.    Be Well,  Dr. Chauncey Reading

## 2020-11-18 NOTE — Progress Notes (Signed)
    SUBJECTIVE:   CHIEF COMPLAINT / HPI: leg swelling  Leg swelling: 75 yo female patient presents with complaint of leg swelling. She noticed swelling in her lower legs in the last week or so. It is not present in the mornings, gets worse throughout the day. She had difficulty putting on her regular shoes a few days ago. She is being treated with vancomycin for 10 days for c. Difficile, today is day 9. Daughter wonders if it is a side effect of abx.  HTN: patient is not taking amlodipine 10 mg. BP is elevated today 180/92. She previously took enalapril and enalapril-HCTZ. But this was discontinued after she had some hypotension. She also has CKD, last cr 1.6 on 11/09/20 (improved from 2 while hospitalized in early June), GFR 33. She has had no episodes of hypotension or dizziness at home. She denies vision changes or other symptoms today.  DM: patient reports that her fasting BG's have been appropriate, 140s.  Vit B 12: daughter asks about mother's level of Vit B12 and if she should have supplementation.   Anemia: patient with recent anemia and hospitalization for severe esophagitis and duodenitis. Hgb 9.9 on 11/09/20. Will repeat CBC today and obtain ferritin. She has appt with GI on 7/1.  PERTINENT  PMH / PSH: DM, HTN, anemia  OBJECTIVE:   BP (!) 180/92   Pulse 97   Ht '5\' 5"'$  (1.651 m)   Wt 167 lb 8 oz (76 kg)   SpO2 (!) 78%   BMI 27.87 kg/m   Nursing note and vitals reviewed GEN: age-appropriate WW, resting comfortably in chair, NAD, WNWD HEENT: NCAT. Sclera without injection or icterus.  Cardiac: Regular rate and rhythm. Normal S1/S2. No murmurs, rubs, or gallops appreciated. 2+ radial pulses. Lungs: Clear bilaterally to ascultation. No increased WOB, no accessory muscle usage. No w/r/r. Neuro: Alert and at baseline Ext: +1 pitting edema to mid calves Psych: Pleasant and appropriate   ASSESSMENT/PLAN:   Anemia Patient hospitalized earlier this month with esophagitis and  duodenitis, suspect blood loss anemia. Will obtain CBC today to follow hgb trend as well as ferritin to evaluate for iron-deficiency anemia. She has f/u with GI on 7/1.  Patient and daughter had question about Vit B12, normal level in 400s. Patient occasionally eats meat, not a strict vegetarian. Recommend that she does not need supplementation with normal levels. Reviewed Dr. Saul Fordyce notes, repeat Vit B12 level in 8-12 weeks.  HTN (hypertension) BP not at goal today 180/92. Previously treated with ACE and thiazide. No recent episodes of hypotension. Patient not taking amlodipine. As she is also having dependent peripheral edema, will treat with thiazide. Will start at low dose and re-evaluate in 3-4 weeks for improvement and recheck BMP at that time (BMP checked last week). - start indapamide 1.'25mg'$  daily - follow up 12/14/20 with Dr. Owens Shark  Peripheral edema Patient with mild peripheral edema, dependent by history. Discussed keeping legs elevated when at home, wearing compression stockings when out (patient already has some), not adding salt to foods. Unlikely to be SE from vancomycin. Patient is not presently taking amlodipine, so also unlikely due to this. Will treat with indapamide for HTN, please see that problem for further details.     Gladys Damme, MD [place]

## 2020-11-19 ENCOUNTER — Other Ambulatory Visit: Payer: Self-pay

## 2020-11-19 ENCOUNTER — Ambulatory Visit (INDEPENDENT_AMBULATORY_CARE_PROVIDER_SITE_OTHER): Payer: PPO | Admitting: Family Medicine

## 2020-11-19 VITALS — BP 180/92 | HR 97 | Ht 65.0 in | Wt 167.5 lb

## 2020-11-19 DIAGNOSIS — R600 Localized edema: Secondary | ICD-10-CM | POA: Insufficient documentation

## 2020-11-19 DIAGNOSIS — R609 Edema, unspecified: Secondary | ICD-10-CM | POA: Insufficient documentation

## 2020-11-19 DIAGNOSIS — I10 Essential (primary) hypertension: Secondary | ICD-10-CM

## 2020-11-19 DIAGNOSIS — D649 Anemia, unspecified: Secondary | ICD-10-CM | POA: Insufficient documentation

## 2020-11-19 MED ORDER — INDAPAMIDE 1.25 MG PO TABS: 1.2500 mg | ORAL_TABLET | Freq: Every day | ORAL | 1 refills | Status: DC

## 2020-11-19 NOTE — Assessment & Plan Note (Addendum)
Patient hospitalized earlier this month with esophagitis and duodenitis, suspect blood loss anemia. Will obtain CBC today to follow hgb trend as well as ferritin to evaluate for iron-deficiency anemia. She has f/u with GI on 7/1.  Patient and daughter had question about Vit B12, normal level in 400s. Patient occasionally eats meat, not a strict vegetarian. Recommend that she does not need supplementation with normal levels. Reviewed Dr. Saul Fordyce notes, repeat Vit B12 level in 8-12 weeks.

## 2020-11-19 NOTE — Assessment & Plan Note (Signed)
BP not at goal today 180/92. Previously treated with ACE and thiazide. No recent episodes of hypotension. Patient not taking amlodipine. As she is also having dependent peripheral edema, will treat with thiazide. Will start at low dose and re-evaluate in 3-4 weeks for improvement and recheck BMP at that time (BMP checked last week). - start indapamide 1.'25mg'$  daily - follow up 12/14/20 with Dr. Owens Shark

## 2020-11-19 NOTE — Assessment & Plan Note (Signed)
Patient with mild peripheral edema, dependent by history. Discussed keeping legs elevated when at home, wearing compression stockings when out (patient already has some), not adding salt to foods. Unlikely to be SE from vancomycin. Patient is not presently taking amlodipine, so also unlikely due to this. Will treat with indapamide for HTN, please see that problem for further details.

## 2020-11-20 ENCOUNTER — Encounter: Payer: Self-pay | Admitting: Family Medicine

## 2020-11-20 ENCOUNTER — Telehealth: Payer: Self-pay | Admitting: Family Medicine

## 2020-11-20 LAB — CBC WITH DIFFERENTIAL/PLATELET
Basophils Absolute: 0.1 10*3/uL (ref 0.0–0.2)
Basos: 1 %
EOS (ABSOLUTE): 0.3 10*3/uL (ref 0.0–0.4)
Eos: 4 %
Hematocrit: 30.8 % — ABNORMAL LOW (ref 34.0–46.6)
Hemoglobin: 10 g/dL — ABNORMAL LOW (ref 11.1–15.9)
Immature Grans (Abs): 0 10*3/uL (ref 0.0–0.1)
Immature Granulocytes: 1 %
Lymphocytes Absolute: 1.5 10*3/uL (ref 0.7–3.1)
Lymphs: 20 %
MCH: 26.5 pg — ABNORMAL LOW (ref 26.6–33.0)
MCHC: 32.5 g/dL (ref 31.5–35.7)
MCV: 82 fL (ref 79–97)
Monocytes Absolute: 0.5 10*3/uL (ref 0.1–0.9)
Monocytes: 7 %
Neutrophils Absolute: 5.2 10*3/uL (ref 1.4–7.0)
Neutrophils: 67 %
Platelets: 319 10*3/uL (ref 150–450)
RBC: 3.78 x10E6/uL (ref 3.77–5.28)
RDW: 14.2 % (ref 11.7–15.4)
WBC: 7.6 10*3/uL (ref 3.4–10.8)

## 2020-11-20 LAB — FERRITIN: Ferritin: 220 ng/mL — ABNORMAL HIGH (ref 15–150)

## 2020-11-20 NOTE — Telephone Encounter (Signed)
Reviewed CBC and ferritin results. Hgb stable at 10, no improvement. Ferritin elevated. Most likely anemia related to CKD. Patient will follow up with Dr. Owens Shark in July. Attempted to call, no answer, HIPAA compliant VM left. Will send letter.  Gladys Damme, MD [facility], PGY-2

## 2020-11-20 NOTE — Telephone Encounter (Signed)
-----   Message from Martyn Malay, MD sent at 11/20/2020  2:49 PM EDT ----- Regarding: RE: results I suspect in part due to CKD?   Will refer to Nephro at follow up most likely  ----- Message ----- From: Gladys Damme, MD Sent: 11/20/2020   8:45 AM EDT To: Martyn Malay, MD Subject: results                                        Hgb has basically not changed, interestingly ferritin high, inflammation? ----- Message ----- From: Interface, [facility] Lab Results In Sent: 11/20/2020   8:19 AM EDT To: Gladys Damme, MD

## 2020-11-27 ENCOUNTER — Ambulatory Visit (INDEPENDENT_AMBULATORY_CARE_PROVIDER_SITE_OTHER): Payer: PPO | Admitting: Nurse Practitioner

## 2020-11-27 VITALS — BP 186/100 | HR 66 | Ht 65.0 in | Wt 168.0 lb

## 2020-11-27 DIAGNOSIS — K209 Esophagitis, unspecified without bleeding: Secondary | ICD-10-CM | POA: Diagnosis not present

## 2020-11-27 DIAGNOSIS — A498 Other bacterial infections of unspecified site: Secondary | ICD-10-CM

## 2020-11-27 DIAGNOSIS — K2980 Duodenitis without bleeding: Secondary | ICD-10-CM

## 2020-11-27 NOTE — Patient Instructions (Signed)
Continue Pantoprazole and Pecpid.   Follow up in 6 months or sooner if needed.  If you are age 76 or older, your body mass index should be between 23-30. Your Body mass index is 27.96 kg/m. If this is out of the aforementioned range listed, please consider follow up with your Primary Care Provider.  If you are age 59 or younger, your body mass index should be between 19-25. Your Body mass index is 27.96 kg/m. If this is out of the aformentioned range listed, please consider follow up with your Primary Care Provider.   __________________________________________________________  The  GI providers would like to encourage you to use [facility] to communicate with providers for non-urgent requests or questions.  Due to long hold times on the telephone, sending your provider a message by [facility] may be a faster and more efficient way to get a response.  Please allow 48 business hours for a response.  Please remember that this is for non-urgent requests.

## 2020-11-27 NOTE — Progress Notes (Signed)
ASSESSMENT AND PLAN    # 75 yo female recently hospitalized with > 1 year of nausea, vomiting, generalized abdominal pain. EGD with biopsies remarkable for severe reflux esophagitis / chronic gastritis ( no H.pylori)  / duodenitis. Given uncontrolled diabetes there was concern that diabetic gastroparesis may be contributing to her symptoms. Her symptoms have resolved with BID PPI,  low dose Pepcid at bedtime and Carafate.  Additionally she has had significant improvement in blood glucose levels at home.  --For now will continue on anti-reflux regimen. She has only been on Carafate for ~ 3 weeks so okay to continue for another 3 -4 weeks. Will get an update from patient in 3-4 weeks.  If she is still doing well then will decrease PPI to once every morning and continue Pepcid at bedtime.   # Chronic diarrhea. C-diff result one month ago were indeterminate with positive antigen and PCR but negative C-diff toxin. Diarrhea has resolved with Vancomycin ( insurance didn't cover Dificid).  --Discussed with patient that she is at risk for reoccurrences of C-diff  # AKI, slowly resolving. Cr 2.04 >>> 1.61. PCP following.   # HTN. Marked elevation in BP today ( 186 /100). PCP has been in process of adjusting / changing HTN medication regimen. She has PCP follow up mid July  # Colon cancer screening. Patient has not had a screening colonoscopy.  --Ideally at some point she needs a screening colonoscopy but would hold off for now as she is recovering from an extended period of nausea, vomiting and diarrhea ( presumably C-diff). Furthermore, her HTN is not controlled and she is currently undergoing Neurology evaluation for the stroke she had February [facility]) and Neurology has asked GI for clearance to possibly start Plavix.   --I will defer to Dr. Henrene Pastor but in the absence of active GI bleeding and ongoing treatment for esophagitis / duodenitis he will probably be fine with starting Plavix.    --Will readdress screening colonoscopy after CVA workup.   # [place] anemia, Hgb 10. Recent baseline hgb not really known (though it was normal in 2019). She entered the hospital on 10/28/20 with a hgb of 13 but was probably hemoconcentrated to some degree at that time. Most recent hgb on 6/23 was down to 10 but in the setting of recent illness / AKI / IV fluids. Ferritin 220.  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 Undergoing workup with Neurology who may want to start Plavix if okay with GI.    HISTORY OF PRESENT ILLNESS    Chief Complaint :hospital follow up  Lauren Craig is a 75 y.o. female known to Dr. Henrene Pastor  with a past medical history significant for DM2, HTN, CVA (Feb 2022) ,  cholecystectomy. See PMH below for any additional medical problems.    Patient hospitalized early June with > 1 year of vomiting, diarrhea,  abdominal pain and weight loss of ~ 20 pounds,  please refer to our 10/29/20 consultation note. CT scan that admission demonstrated abnormalities of her esophagus and duodenum. There was also concern for appendicitis but General Surgery evaluation was not consistent with that. GI pathogen panel was negative. C-diff antigen was positive, toxigenic C-diff PCR was positive but toxin negative was negative. Given diarrhea she was treated with Dificid but in the outpatient setting it was no covered by her insurance so she was treated instead with vancomycin. Inpatient EGD by Dr. Henrene Pastor showed severe reflux esophagitis  and duodenitis. She was treated with BID PPI.   INTERVAL HISTORY: Patient is here for hospital follow up. Her nausea / vomiting have have improved significantly. Not requiring anti-emetics. She is taking BID PPI, Pepcid Q HS plus Carafate before dinner. Her diarrhea has resolved after completion of C-diff treatment.  She has been really working on watching her diet. Limiting consumption of sweets.  Blood sugars have significant  improved, averaging around 160. Marland Kitchen She has been able to get off insulin.    DATA REVIEWED: 11/19/20 Hgb 10, MCV 82, ferritin 220    PREVIOUS ENDOSCOPIC EVALUATIONS / PERTINENT STUDIES:   June 2022 EGD for chronic nausea / vomiting Severe reflux esophagitis. Severe duodenitis. Status post biopsies. Otherwise unremarkable EGD  Gastric biopsies - chronic gastritis. 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   Current Outpatient Medications  Medication Sig Dispense Refill   acetaminophen (TYLENOL) 325 MG tablet Take 2 tablets (650 mg total) by mouth every 6 (six) hours as needed for headache.     albuterol (PROVENTIL HFA;VENTOLIN HFA) 108 (90 Base) MCG/ACT inhaler Inhale 2 puffs into the lungs every 6 (six) hours as needed for wheezing or shortness of breath.     amLODipine (NORVASC) 10 MG tablet Take 1 tablet (10 mg total) by mouth daily. (Patient not taking: Reported on 11/19/2020) 90 tablet 0   atorvastatin (LIPITOR) 40 MG tablet Take 40 mg by mouth daily.     carvedilol (COREG) 6.25 MG tablet Take 1 tablet (6.25 mg total) by mouth 2 (two) times daily with a meal. 60 tablet 0   famotidine (PEPCID) 10 MG tablet Take 1 tablet (10 mg total) by mouth daily. 30 tablet 0   fluticasone (FLONASE) 50 MCG/ACT nasal spray Place into both nostrils.     indapamide (LOZOL) 1.25 MG tablet Take 1 tablet (1.25 mg total) by mouth daily. 30 tablet 1   ondansetron (ZOFRAN) 4 MG tablet Take 4 mg by mouth daily as needed for  nausea or vomiting.     pantoprazole (PROTONIX) 40 MG tablet Take 1 tablet (40 mg total) by mouth 2 (two) times daily. 60 tablet 0   sertraline (ZOLOFT) 50 MG tablet Take 1 tablet (50 mg total) by mouth in the morning and at bedtime. 180 tablet 3   sucralfate (CARAFATE) 1 g tablet Take 1 tablet (1 g total) by mouth daily before supper. (Patient not taking: Reported on 11/19/2020) 30 tablet 0   No current facility-administered medications for this visit.    Review of Systems: No chest pain. No shortness of breath. No urinary complaints.   PHYSICAL EXAM :    Wt Readings from Last 3 Encounters:  11/27/20 168 lb (76.2 kg)  11/19/20 167 lb 8 oz (76 kg)  11/09/20 174 lb 3.2 oz (79 kg)    BP (!) 186/100   Pulse 66   Ht '5\' 5"'$  (1.651 m)   Wt 168 lb (76.2 kg)   BMI 27.96  kg/m  Constitutional:  Pleasant female in no acute distress. Psychiatric: Normal mood and affect. Behavior is normal. EENT: Pupils normal.  Conjunctivae are normal. No scleral icterus. Neck supple.  Cardiovascular: Normal rate, regular rhythm. No edema Pulmonary/chest: Effort normal and breath sounds normal. No wheezing, rales or rhonchi. Abdominal: Soft, nondistended, nontender. Bowel sounds active throughout. There are no masses palpable. No hepatomegaly. Neurological: Alert and oriented to person place and time. Skin: Skin is warm and dry. No rashes noted.   I spent 30 minutes total reviewing records, obtaining history, performing exam, counseling patient and documenting visit / findings.   Tye Savoy, NP  11/27/2020, 3:35 PM

## 2020-12-01 ENCOUNTER — Encounter: Payer: Self-pay | Admitting: Nurse Practitioner

## 2020-12-01 NOTE — Progress Notes (Signed)
Assessment and plan reviewed. Okay to resume Plavix. Have her see you back in the office in a couple of months.  If she is doing well, you can arrange her colonoscopy at that time.  Thanks

## 2020-12-03 DIAGNOSIS — J301 Allergic rhinitis due to pollen: Secondary | ICD-10-CM | POA: Insufficient documentation

## 2020-12-03 DIAGNOSIS — Z8739 Personal history of other diseases of the musculoskeletal system and connective tissue: Secondary | ICD-10-CM | POA: Insufficient documentation

## 2020-12-03 DIAGNOSIS — M159 Polyosteoarthritis, unspecified: Secondary | ICD-10-CM | POA: Insufficient documentation

## 2020-12-03 DIAGNOSIS — F419 Anxiety disorder, unspecified: Secondary | ICD-10-CM | POA: Insufficient documentation

## 2020-12-03 DIAGNOSIS — Z794 Long term (current) use of insulin: Secondary | ICD-10-CM | POA: Insufficient documentation

## 2020-12-03 DIAGNOSIS — E1169 Type 2 diabetes mellitus with other specified complication: Secondary | ICD-10-CM | POA: Insufficient documentation

## 2020-12-03 DIAGNOSIS — E118 Type 2 diabetes mellitus with unspecified complications: Secondary | ICD-10-CM | POA: Insufficient documentation

## 2020-12-03 DIAGNOSIS — E1151 Type 2 diabetes mellitus with diabetic peripheral angiopathy without gangrene: Secondary | ICD-10-CM | POA: Insufficient documentation

## 2020-12-03 DIAGNOSIS — E1159 Type 2 diabetes mellitus with other circulatory complications: Secondary | ICD-10-CM | POA: Insufficient documentation

## 2020-12-03 DIAGNOSIS — E782 Mixed hyperlipidemia: Secondary | ICD-10-CM | POA: Insufficient documentation

## 2020-12-03 DIAGNOSIS — Z8619 Personal history of other infectious and parasitic diseases: Secondary | ICD-10-CM | POA: Insufficient documentation

## 2020-12-14 ENCOUNTER — Other Ambulatory Visit: Payer: Self-pay

## 2020-12-14 ENCOUNTER — Encounter: Payer: Self-pay | Admitting: Family Medicine

## 2020-12-14 ENCOUNTER — Ambulatory Visit (INDEPENDENT_AMBULATORY_CARE_PROVIDER_SITE_OTHER): Payer: PPO | Admitting: Family Medicine

## 2020-12-14 VITALS — BP 219/87 | HR 97 | Ht 65.0 in | Wt 165.2 lb

## 2020-12-14 DIAGNOSIS — F39 Unspecified mood [affective] disorder: Secondary | ICD-10-CM

## 2020-12-14 DIAGNOSIS — E118 Type 2 diabetes mellitus with unspecified complications: Secondary | ICD-10-CM

## 2020-12-14 DIAGNOSIS — R112 Nausea with vomiting, unspecified: Secondary | ICD-10-CM | POA: Diagnosis not present

## 2020-12-14 DIAGNOSIS — I679 Cerebrovascular disease, unspecified: Secondary | ICD-10-CM

## 2020-12-14 DIAGNOSIS — R609 Edema, unspecified: Secondary | ICD-10-CM | POA: Diagnosis not present

## 2020-12-14 DIAGNOSIS — I10 Essential (primary) hypertension: Secondary | ICD-10-CM

## 2020-12-14 DIAGNOSIS — K2210 Ulcer of esophagus without bleeding: Secondary | ICD-10-CM

## 2020-12-14 DIAGNOSIS — Z1159 Encounter for screening for other viral diseases: Secondary | ICD-10-CM

## 2020-12-14 MED ORDER — ALBUTEROL SULFATE HFA 108 (90 BASE) MCG/ACT IN AERS: 2.0000 | INHALATION_SPRAY | Freq: Four times a day (QID) | RESPIRATORY_TRACT | 3 refills | Status: DC | PRN

## 2020-12-14 MED ORDER — SUCRALFATE 1 G PO TABS: 1.0000 g | ORAL_TABLET | Freq: Every day | ORAL | 0 refills | Status: DC

## 2020-12-14 MED ORDER — PANTOPRAZOLE SODIUM 40 MG PO TBEC: 40.0000 mg | DELAYED_RELEASE_TABLET | Freq: Two times a day (BID) | ORAL | 1 refills | Status: DC

## 2020-12-14 MED ORDER — CARVEDILOL 6.25 MG PO TABS: 6.2500 mg | ORAL_TABLET | Freq: Two times a day (BID) | ORAL | 3 refills | Status: DC

## 2020-12-14 MED ORDER — AMLODIPINE BESYLATE 10 MG PO TABS: 10.0000 mg | ORAL_TABLET | Freq: Every day | ORAL | 3 refills | Status: DC

## 2020-12-14 MED ORDER — SERTRALINE HCL 50 MG PO TABS: 50.0000 mg | ORAL_TABLET | Freq: Two times a day (BID) | ORAL | 3 refills | Status: DC

## 2020-12-14 MED ORDER — INDAPAMIDE 1.25 MG PO TABS: 1.2500 mg | ORAL_TABLET | Freq: Every day | ORAL | 3 refills | Status: DC

## 2020-12-14 MED ORDER — FAMOTIDINE 10 MG PO TABS: 10.0000 mg | ORAL_TABLET | Freq: Every day | ORAL | 3 refills | Status: DC

## 2020-12-14 MED ORDER — ATORVASTATIN CALCIUM 40 MG PO TABS: 40.0000 mg | ORAL_TABLET | Freq: Every day | ORAL | 3 refills | Status: DC

## 2020-12-14 NOTE — Assessment & Plan Note (Signed)
Differential remains broad.  This could be due to uncontrolled hypertension, worsening kidney disease or heart failure.  Discussed with patient.  Will obtain labs today and restart antihypertensive therapy.  She is to follow-up with Dr. Claiborne Billings next week for this.  As symmetric low suspicion for venous thrombosis, but could consider ultrasound in future.  If edema persist recommend echocardiogram in addition to urine protein.

## 2020-12-14 NOTE — Assessment & Plan Note (Signed)
Improved on repeat but still uncontrolled today.  Discussed with patient.  Reviewed ED return precautions.  Recommend she pick up all of her medications today and restart them.  She is to stop lisinopril.  She will continue continue indapamide, amlodipine and Coreg.  Discussed symptoms of hypertensive emergency.  Metabolic panel today.
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
## 2020-12-14 NOTE — Assessment & Plan Note (Signed)
We will repeat CBC today.  Per gastroenterology the patient can restart Plavix when appropriate.  Would like to restart this is soon as possible.  If CBC near baseline will restart.  Discussed with patient today.

## 2020-12-14 NOTE — Patient Instructions (Addendum)
It was wonderful to see you today.  Please bring ALL of your medications with you to every visit.   Today we talked about:  -Getting blood work done--I will call you with results   ---Going to pick up your blood pressure pills- You will see Dr. Georgina Peer July 25th at 230 PM   -- [place]  --If your blood count (hemoglobin) is good we will restart your medication to prevent stroke (plavix, also called clopidogrel)  --You will be called about a heart doctor appointment and a sleep study     Please call [phone] about Speech Therapy       Thank you for choosing [facility].   Please call [phone] with any questions about today's appointment.  Please be sure to schedule follow up at the front  desk before you leave today.   Dorris Singh, MD  Family Medicine

## 2020-12-15 ENCOUNTER — Telehealth: Payer: Self-pay | Admitting: Family Medicine

## 2020-12-15 DIAGNOSIS — I679 Cerebrovascular disease, unspecified: Secondary | ICD-10-CM

## 2020-12-15 DIAGNOSIS — H9203 Otalgia, bilateral: Secondary | ICD-10-CM

## 2020-12-15 LAB — CBC
Hematocrit: 34.8 % (ref 34.0–46.6)
Hemoglobin: 11.1 g/dL (ref 11.1–15.9)
MCH: 26 pg — ABNORMAL LOW (ref 26.6–33.0)
MCHC: 31.9 g/dL (ref 31.5–35.7)
MCV: 82 fL (ref 79–97)
Platelets: 265 10*3/uL (ref 150–450)
RBC: 4.27 x10E6/uL (ref 3.77–5.28)
RDW: 14.6 % (ref 11.7–15.4)
WBC: 6.8 10*3/uL (ref 3.4–10.8)

## 2020-12-15 LAB — COMPREHENSIVE METABOLIC PANEL
ALT: 9 IU/L (ref 0–32)
AST: 10 IU/L (ref 0–40)
Albumin/Globulin Ratio: 1.3 (ref 1.2–2.2)
Albumin: 3.6 g/dL — ABNORMAL LOW (ref 3.7–4.7)
Alkaline Phosphatase: 114 IU/L (ref 44–121)
BUN/Creatinine Ratio: 10 — ABNORMAL LOW (ref 12–28)
BUN: 14 mg/dL (ref 8–27)
Bilirubin Total: <0.2 mg/dL (ref 0.0–1.2)
CO2: 20 mmol/L (ref 20–29)
Calcium: 9 mg/dL (ref 8.7–10.3)
Chloride: 105 mmol/L (ref 96–106)
Creatinine, Ser: 1.45 mg/dL — ABNORMAL HIGH (ref 0.57–1.00)
Globulin, Total: 2.8 g/dL (ref 1.5–4.5)
Glucose: 150 mg/dL — ABNORMAL HIGH (ref 65–99)
Potassium: 4.7 mmol/L (ref 3.5–5.2)
Sodium: 142 mmol/L (ref 134–144)
Total Protein: 6.4 g/dL (ref 6.0–8.5)
eGFR: 37 mL/min/{1.73_m2} — ABNORMAL LOW (ref >59)

## 2020-12-15 MED ORDER — CLOPIDOGREL BISULFATE 75 MG PO TABS: 75.0000 mg | ORAL_TABLET | Freq: Every day | ORAL | 3 refills | Status: DC

## 2020-12-15 NOTE — Telephone Encounter (Signed)
Called patient to discuss results.  Hemoglobin is normalized no longer anemic.  Given gastroenterology recommendations will restart Plavix.  Repeat hemoglobin in August  The patient reports her swelling is markedly improved since she started her antihypertensive therapy.  She is to stop lisinopril she is not taking her other indicated antihypertensive pills as denoted on her medication list.  She denies side effects this morning  The patient has follow-up with Dr. Georgina Peer.  Recommend BMP at that visit.  All questions answered, teachback method used to ensure understanding.  Dorris Singh, MD  Family Medicine Teaching Service

## 2020-12-16 LAB — SPECIMEN STATUS REPORT

## 2020-12-16 LAB — HEPATITIS C ANTIBODY: Hep C Virus Ab: 0.2 s/co ratio (ref 0.0–0.9)

## 2020-12-17 ENCOUNTER — Emergency Department (HOSPITAL_COMMUNITY): Payer: PPO

## 2020-12-17 ENCOUNTER — Encounter: Payer: Self-pay | Admitting: Family Medicine

## 2020-12-17 ENCOUNTER — Inpatient Hospital Stay (HOSPITAL_COMMUNITY)
Admission: EM | Admit: 2020-12-17 | Discharge: 2020-12-25 | DRG: 065 | Disposition: A | Discharge status: Rehabilitation Facility | Payer: PPO | Attending: Family Medicine | Admitting: Family Medicine

## 2020-12-17 ENCOUNTER — Other Ambulatory Visit: Payer: Self-pay

## 2020-12-17 ENCOUNTER — Encounter (HOSPITAL_COMMUNITY): Payer: Self-pay

## 2020-12-17 DIAGNOSIS — Z79899 Other long term (current) drug therapy: Secondary | ICD-10-CM | POA: Diagnosis not present

## 2020-12-17 DIAGNOSIS — K2210 Ulcer of esophagus without bleeding: Secondary | ICD-10-CM | POA: Diagnosis not present

## 2020-12-17 DIAGNOSIS — I959 Hypotension, unspecified: Secondary | ICD-10-CM | POA: Diagnosis not present

## 2020-12-17 DIAGNOSIS — S8012XA Contusion of left lower leg, initial encounter: Secondary | ICD-10-CM | POA: Diagnosis present

## 2020-12-17 DIAGNOSIS — R0902 Hypoxemia: Secondary | ICD-10-CM | POA: Diagnosis not present

## 2020-12-17 DIAGNOSIS — F32A Depression, unspecified: Secondary | ICD-10-CM | POA: Diagnosis present

## 2020-12-17 DIAGNOSIS — R609 Edema, unspecified: Secondary | ICD-10-CM

## 2020-12-17 DIAGNOSIS — Y92410 Unspecified street and highway as the place of occurrence of the external cause: Secondary | ICD-10-CM

## 2020-12-17 DIAGNOSIS — I6389 Other cerebral infarction: Secondary | ICD-10-CM | POA: Diagnosis not present

## 2020-12-17 DIAGNOSIS — S2232XA Fracture of one rib, left side, initial encounter for closed fracture: Secondary | ICD-10-CM | POA: Diagnosis present

## 2020-12-17 DIAGNOSIS — R079 Chest pain, unspecified: Secondary | ICD-10-CM | POA: Diagnosis not present

## 2020-12-17 DIAGNOSIS — N183 Chronic kidney disease, stage 3 unspecified: Secondary | ICD-10-CM | POA: Diagnosis not present

## 2020-12-17 DIAGNOSIS — I129 Hypertensive chronic kidney disease with stage 1 through stage 4 chronic kidney disease, or unspecified chronic kidney disease: Secondary | ICD-10-CM | POA: Diagnosis present

## 2020-12-17 DIAGNOSIS — T148XXA Other injury of unspecified body region, initial encounter: Secondary | ICD-10-CM | POA: Diagnosis present

## 2020-12-17 DIAGNOSIS — Z8249 Family history of ischemic heart disease and other diseases of the circulatory system: Secondary | ICD-10-CM

## 2020-12-17 DIAGNOSIS — E1122 Type 2 diabetes mellitus with diabetic chronic kidney disease: Secondary | ICD-10-CM | POA: Diagnosis present

## 2020-12-17 DIAGNOSIS — S22029A Unspecified fracture of second thoracic vertebra, initial encounter for closed fracture: Secondary | ICD-10-CM | POA: Diagnosis present

## 2020-12-17 DIAGNOSIS — Z7982 Long term (current) use of aspirin: Secondary | ICD-10-CM

## 2020-12-17 DIAGNOSIS — I159 Secondary hypertension, unspecified: Secondary | ICD-10-CM

## 2020-12-17 DIAGNOSIS — S22019A Unspecified fracture of first thoracic vertebra, initial encounter for closed fracture: Secondary | ICD-10-CM | POA: Diagnosis present

## 2020-12-17 DIAGNOSIS — I6340 Cerebral infarction due to embolism of unspecified cerebral artery: Secondary | ICD-10-CM | POA: Insufficient documentation

## 2020-12-17 DIAGNOSIS — Z90710 Acquired absence of both cervix and uterus: Secondary | ICD-10-CM | POA: Diagnosis not present

## 2020-12-17 DIAGNOSIS — Z7902 Long term (current) use of antithrombotics/antiplatelets: Secondary | ICD-10-CM | POA: Diagnosis not present

## 2020-12-17 DIAGNOSIS — K2980 Duodenitis without bleeding: Secondary | ICD-10-CM

## 2020-12-17 DIAGNOSIS — M660 Rupture of popliteal cyst: Secondary | ICD-10-CM | POA: Diagnosis present

## 2020-12-17 DIAGNOSIS — I69322 Dysarthria following cerebral infarction: Secondary | ICD-10-CM | POA: Diagnosis not present

## 2020-12-17 DIAGNOSIS — E669 Obesity, unspecified: Secondary | ICD-10-CM | POA: Diagnosis present

## 2020-12-17 DIAGNOSIS — I671 Cerebral aneurysm, nonruptured: Secondary | ICD-10-CM | POA: Diagnosis present

## 2020-12-17 DIAGNOSIS — S2221XD Fracture of manubrium, subsequent encounter for fracture with routine healing: Secondary | ICD-10-CM | POA: Diagnosis not present

## 2020-12-17 DIAGNOSIS — Z20822 Contact with and (suspected) exposure to covid-19: Secondary | ICD-10-CM | POA: Diagnosis present

## 2020-12-17 DIAGNOSIS — N281 Cyst of kidney, acquired: Secondary | ICD-10-CM

## 2020-12-17 DIAGNOSIS — F39 Unspecified mood [affective] disorder: Secondary | ICD-10-CM

## 2020-12-17 DIAGNOSIS — R29701 NIHSS score 1: Secondary | ICD-10-CM | POA: Diagnosis present

## 2020-12-17 DIAGNOSIS — I679 Cerebrovascular disease, unspecified: Secondary | ICD-10-CM | POA: Diagnosis not present

## 2020-12-17 DIAGNOSIS — E1151 Type 2 diabetes mellitus with diabetic peripheral angiopathy without gangrene: Secondary | ICD-10-CM | POA: Diagnosis present

## 2020-12-17 DIAGNOSIS — I6529 Occlusion and stenosis of unspecified carotid artery: Secondary | ICD-10-CM | POA: Diagnosis not present

## 2020-12-17 DIAGNOSIS — N1832 Chronic kidney disease, stage 3b: Secondary | ICD-10-CM | POA: Diagnosis present

## 2020-12-17 DIAGNOSIS — I517 Cardiomegaly: Secondary | ICD-10-CM | POA: Diagnosis not present

## 2020-12-17 DIAGNOSIS — I63421 Cerebral infarction due to embolism of right anterior cerebral artery: Secondary | ICD-10-CM | POA: Diagnosis present

## 2020-12-17 DIAGNOSIS — M25511 Pain in right shoulder: Secondary | ICD-10-CM | POA: Diagnosis present

## 2020-12-17 DIAGNOSIS — S199XXA Unspecified injury of neck, initial encounter: Secondary | ICD-10-CM | POA: Diagnosis not present

## 2020-12-17 DIAGNOSIS — E1136 Type 2 diabetes mellitus with diabetic cataract: Secondary | ICD-10-CM | POA: Diagnosis not present

## 2020-12-17 DIAGNOSIS — L03116 Cellulitis of left lower limb: Secondary | ICD-10-CM | POA: Diagnosis not present

## 2020-12-17 DIAGNOSIS — S0990XA Unspecified injury of head, initial encounter: Secondary | ICD-10-CM | POA: Diagnosis not present

## 2020-12-17 DIAGNOSIS — S22018S Other fracture of first thoracic vertebra, sequela: Secondary | ICD-10-CM | POA: Diagnosis not present

## 2020-12-17 DIAGNOSIS — M25562 Pain in left knee: Secondary | ICD-10-CM | POA: Diagnosis not present

## 2020-12-17 DIAGNOSIS — R0789 Other chest pain: Secondary | ICD-10-CM | POA: Diagnosis not present

## 2020-12-17 DIAGNOSIS — H5340 Unspecified visual field defects: Secondary | ICD-10-CM

## 2020-12-17 DIAGNOSIS — R2689 Other abnormalities of gait and mobility: Secondary | ICD-10-CM | POA: Diagnosis not present

## 2020-12-17 DIAGNOSIS — Z833 Family history of diabetes mellitus: Secondary | ICD-10-CM

## 2020-12-17 DIAGNOSIS — Z6827 Body mass index (BMI) 27.0-27.9, adult: Secondary | ICD-10-CM

## 2020-12-17 DIAGNOSIS — S2221XA Fracture of manubrium, initial encounter for closed fracture: Principal | ICD-10-CM | POA: Diagnosis present

## 2020-12-17 DIAGNOSIS — S22018G Other fracture of first thoracic vertebra, subsequent encounter for fracture with delayed healing: Secondary | ICD-10-CM | POA: Diagnosis not present

## 2020-12-17 DIAGNOSIS — G319 Degenerative disease of nervous system, unspecified: Secondary | ICD-10-CM | POA: Diagnosis not present

## 2020-12-17 DIAGNOSIS — M4312 Spondylolisthesis, cervical region: Secondary | ICD-10-CM | POA: Diagnosis not present

## 2020-12-17 DIAGNOSIS — H9203 Otalgia, bilateral: Secondary | ICD-10-CM

## 2020-12-17 DIAGNOSIS — E782 Mixed hyperlipidemia: Secondary | ICD-10-CM | POA: Diagnosis not present

## 2020-12-17 DIAGNOSIS — S298XXA Other specified injuries of thorax, initial encounter: Secondary | ICD-10-CM

## 2020-12-17 DIAGNOSIS — Z87891 Personal history of nicotine dependence: Secondary | ICD-10-CM | POA: Diagnosis not present

## 2020-12-17 DIAGNOSIS — F419 Anxiety disorder, unspecified: Secondary | ICD-10-CM | POA: Diagnosis present

## 2020-12-17 DIAGNOSIS — I639 Cerebral infarction, unspecified: Secondary | ICD-10-CM | POA: Diagnosis not present

## 2020-12-17 DIAGNOSIS — S3991XA Unspecified injury of abdomen, initial encounter: Secondary | ICD-10-CM | POA: Diagnosis not present

## 2020-12-17 DIAGNOSIS — Z8673 Personal history of transient ischemic attack (TIA), and cerebral infarction without residual deficits: Secondary | ICD-10-CM | POA: Diagnosis not present

## 2020-12-17 DIAGNOSIS — K219 Gastro-esophageal reflux disease without esophagitis: Secondary | ICD-10-CM | POA: Diagnosis present

## 2020-12-17 DIAGNOSIS — M25462 Effusion, left knee: Secondary | ICD-10-CM | POA: Diagnosis not present

## 2020-12-17 DIAGNOSIS — Z89422 Acquired absence of other left toe(s): Secondary | ICD-10-CM

## 2020-12-17 DIAGNOSIS — Z8719 Personal history of other diseases of the digestive system: Secondary | ICD-10-CM

## 2020-12-17 DIAGNOSIS — I10 Essential (primary) hypertension: Secondary | ICD-10-CM | POA: Diagnosis not present

## 2020-12-17 DIAGNOSIS — I672 Cerebral atherosclerosis: Secondary | ICD-10-CM | POA: Diagnosis present

## 2020-12-17 DIAGNOSIS — Z818 Family history of other mental and behavioral disorders: Secondary | ICD-10-CM

## 2020-12-17 LAB — COMPREHENSIVE METABOLIC PANEL
ALT: 15 U/L (ref 0–44)
AST: 15 U/L (ref 15–41)
Albumin: 3 g/dL — ABNORMAL LOW (ref 3.5–5.0)
Alkaline Phosphatase: 86 U/L (ref 38–126)
Anion gap: 4 — ABNORMAL LOW (ref 5–15)
BUN: 24 mg/dL — ABNORMAL HIGH (ref 8–23)
CO2: 23 mmol/L (ref 22–32)
Calcium: 8.6 mg/dL — ABNORMAL LOW (ref 8.9–10.3)
Chloride: 106 mmol/L (ref 98–111)
Creatinine, Ser: 1.58 mg/dL — ABNORMAL HIGH (ref 0.44–1.00)
GFR, Estimated: 34 mL/min — ABNORMAL LOW (ref >60)
Glucose, Bld: 202 mg/dL — ABNORMAL HIGH (ref 70–99)
Potassium: 5.2 mmol/L — ABNORMAL HIGH (ref 3.5–5.1)
Sodium: 133 mmol/L — ABNORMAL LOW (ref 135–145)
Total Bilirubin: 0.6 mg/dL (ref 0.3–1.2)
Total Protein: 6.2 g/dL — ABNORMAL LOW (ref 6.5–8.1)

## 2020-12-17 LAB — SAMPLE TO BLOOD BANK

## 2020-12-17 LAB — CBC
HCT: 34.4 % — ABNORMAL LOW (ref 36.0–46.0)
Hemoglobin: 10.4 g/dL — ABNORMAL LOW (ref 12.0–15.0)
MCH: 26.4 pg (ref 26.0–34.0)
MCHC: 30.2 g/dL (ref 30.0–36.0)
MCV: 87.3 fL (ref 80.0–100.0)
Platelets: 251 10*3/uL (ref 150–400)
RBC: 3.94 MIL/uL (ref 3.87–5.11)
RDW: 14.7 % (ref 11.5–15.5)
WBC: 9.4 10*3/uL (ref 4.0–10.5)
nRBC: 0 % (ref 0.0–0.2)

## 2020-12-17 LAB — URINALYSIS, ROUTINE W REFLEX MICROSCOPIC
Bilirubin Urine: NEGATIVE
Glucose, UA: 50 mg/dL — AB
Hgb urine dipstick: NEGATIVE
Ketones, ur: NEGATIVE mg/dL
Leukocytes,Ua: NEGATIVE
Nitrite: NEGATIVE
Protein, ur: 100 mg/dL — AB
Specific Gravity, Urine: 1.011 (ref 1.005–1.030)
pH: 9 — ABNORMAL HIGH (ref 5.0–8.0)

## 2020-12-17 LAB — PROTIME-INR
INR: 0.9 (ref 0.8–1.2)
Prothrombin Time: 12.4 seconds (ref 11.4–15.2)

## 2020-12-17 LAB — LACTIC ACID, PLASMA: Lactic Acid, Venous: 1.6 mmol/L (ref 0.5–1.9)

## 2020-12-17 LAB — ETHANOL: Alcohol, Ethyl (B): <10 mg/dL (ref <10)
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
## 2020-12-17 MED ORDER — HYDROCODONE-ACETAMINOPHEN 5-325 MG PO TABS: 1.0000 | ORAL_TABLET | Freq: Four times a day (QID) | ORAL | 0 refills | Status: DC | PRN

## 2020-12-17 MED ORDER — FENTANYL CITRATE (PF) 100 MCG/2ML IJ SOLN
50.0000 ug | Freq: Once | INTRAMUSCULAR | Status: AC
Start: 2020-12-17 — End: 2020-12-17
  Administered 2020-12-17: 50 ug via INTRAVENOUS
  Filled 2020-12-17: qty 2

## 2020-12-17 MED ORDER — HYDROCODONE-ACETAMINOPHEN 5-325 MG PO TABS
1.0000 | ORAL_TABLET | Freq: Once | ORAL | Status: AC
  Administered 2020-12-17: 1 via ORAL
  Filled 2020-12-17: qty 1
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
## 2020-12-17 NOTE — ED Notes (Signed)
Patient transported to CT 
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
## 2020-12-18 ENCOUNTER — Encounter (HOSPITAL_COMMUNITY): Payer: Self-pay | Admitting: General Surgery

## 2020-12-18 ENCOUNTER — Other Ambulatory Visit: Payer: Self-pay | Admitting: Family Medicine

## 2020-12-18 ENCOUNTER — Observation Stay (HOSPITAL_COMMUNITY): Payer: PPO

## 2020-12-18 ENCOUNTER — Telehealth: Payer: Self-pay

## 2020-12-18 DIAGNOSIS — I679 Cerebrovascular disease, unspecified: Secondary | ICD-10-CM

## 2020-12-18 DIAGNOSIS — S0990XA Unspecified injury of head, initial encounter: Secondary | ICD-10-CM | POA: Diagnosis not present

## 2020-12-18 DIAGNOSIS — T148XXA Other injury of unspecified body region, initial encounter: Secondary | ICD-10-CM | POA: Diagnosis present

## 2020-12-18 LAB — I-STAT CHEM 8, ED
BUN: 25 mg/dL — ABNORMAL HIGH (ref 8–23)
Calcium, Ion: 1.15 mmol/L (ref 1.15–1.40)
Chloride: 106 mmol/L (ref 98–111)
Creatinine, Ser: 1.5 mg/dL — ABNORMAL HIGH (ref 0.44–1.00)
Glucose, Bld: 199 mg/dL — ABNORMAL HIGH (ref 70–99)
HCT: 32 % — ABNORMAL LOW (ref 36.0–46.0)
Hemoglobin: 10.9 g/dL — ABNORMAL LOW (ref 12.0–15.0)
Potassium: 5.1 mmol/L (ref 3.5–5.1)
Sodium: 137 mmol/L (ref 135–145)
TCO2: 23 mmol/L (ref 22–32)

## 2020-12-18 LAB — SARS CORONAVIRUS 2 (TAT 6-24 HRS): SARS Coronavirus 2: NEGATIVE

## 2020-12-18 LAB — LDL CHOLESTEROL, DIRECT: Direct LDL: 201.5 mg/dL — ABNORMAL HIGH (ref 0–99)

## 2020-12-18 IMAGING — MR MR MRA HEAD W/O CM
2 series · 17 of 48 positions shown · IV contrast (gadavist)
Comparison: CT head without contrast [DATE]

CLINICAL DATA: Head trauma. Abnormal head CT with new white matter
hypoattenuation. Possible acute stroke.

EXAM:
MRI HEAD WITHOUT AND WITH CONTRAST
MRA HEAD WITHOUT CONTRAST
MRA NECK WITHOUT CONTRAST
TECHNIQUE: Multiplanar, multiecho pulse sequences of the brain and surrounding
structures were obtained without and with intravenous contrast.
Angiographic images of the Circle of Willis were obtained using MRA
technique without intravenous contrast. Angiographic images of the
neck were obtained using MRA technique without and with intravenous
contrast. Carotid stenosis measurements (when applicable) are
obtained utilizing NASCET criteria, using the distal internal
carotid diameter as the denominator.
CONTRAST:  7.5mL GADAVIST GADOBUTROL 1 MMOL/ML IV SOLN

[Series 3: ax (id) · axial · 1.0mm · 0.43mm/px · z∈[-56,+24]mm · 16 of 176 slices shown]
[im 1/176]
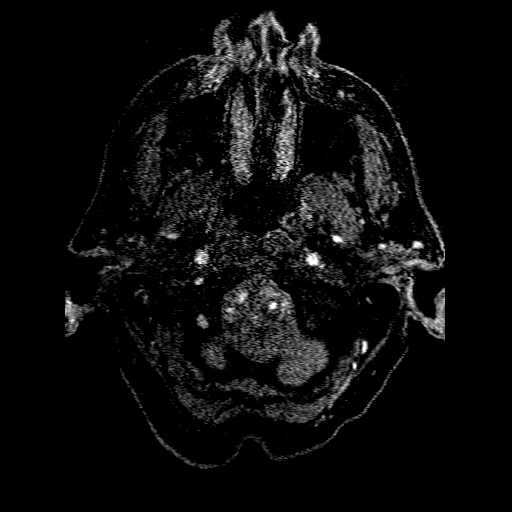
[im 4/176]
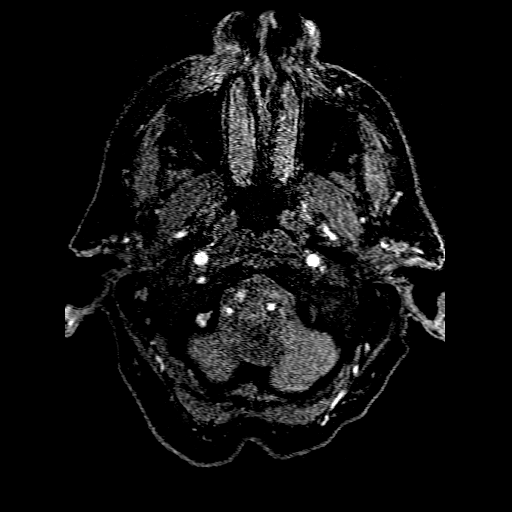
[im 8/176]
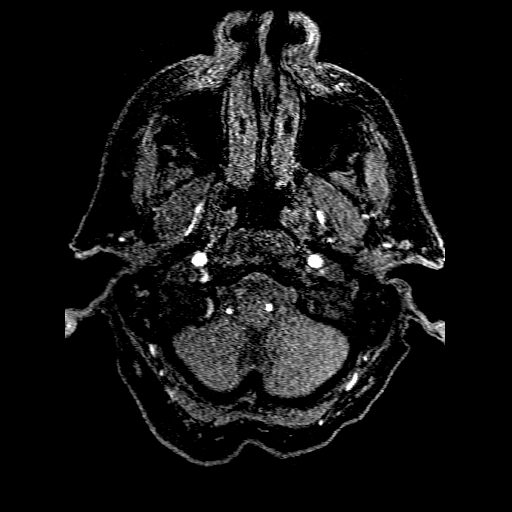
[im 12/176]
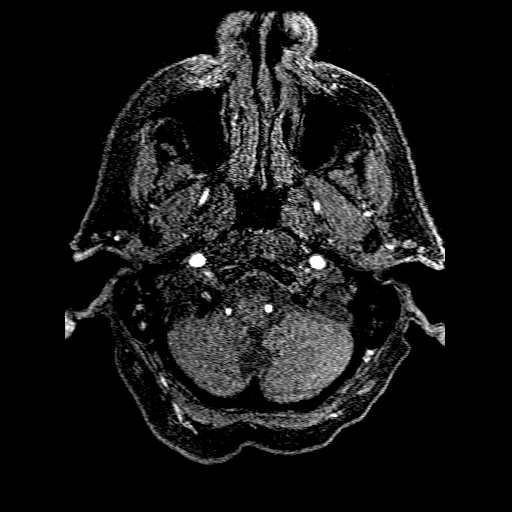
[im 16/176]
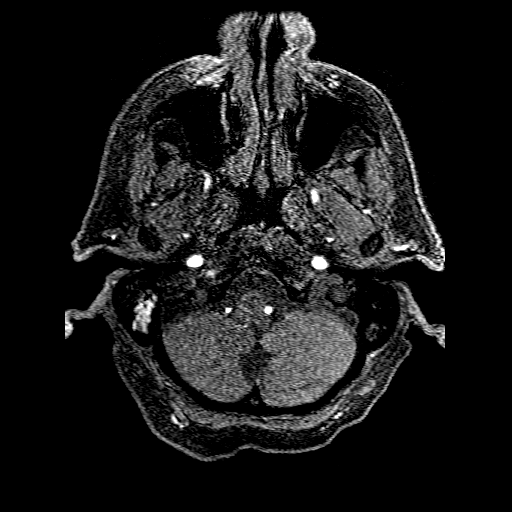
[im 20/176]
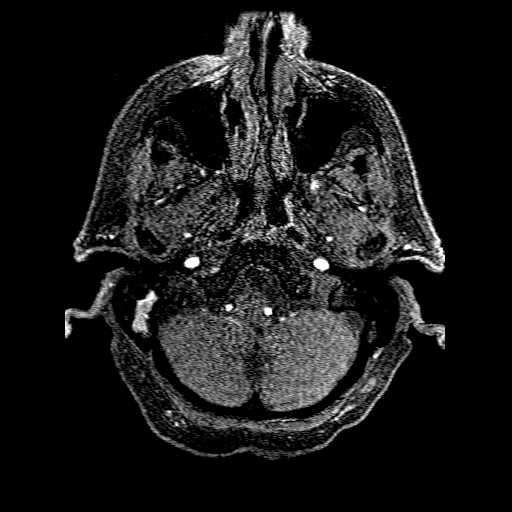
[im 27/176]
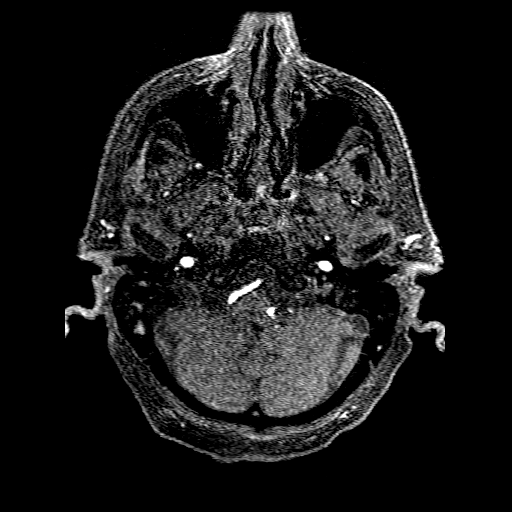
[im 31/176]
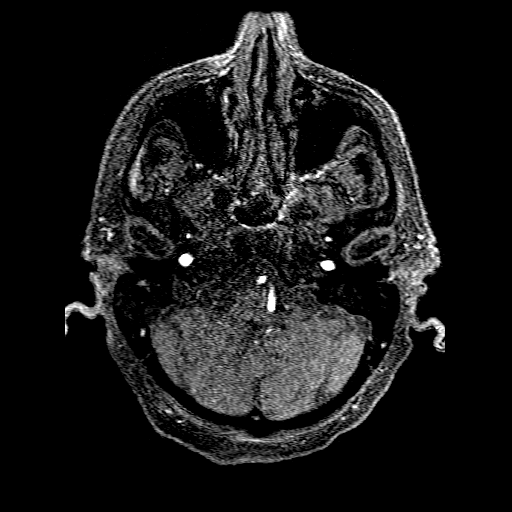
[im 54/176]
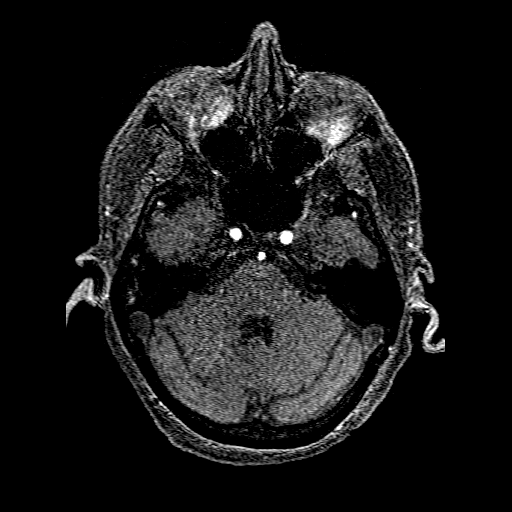
[im 77/176]
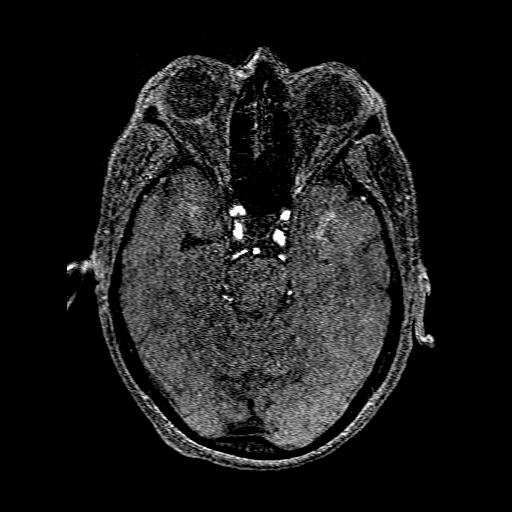
[im 88/176]
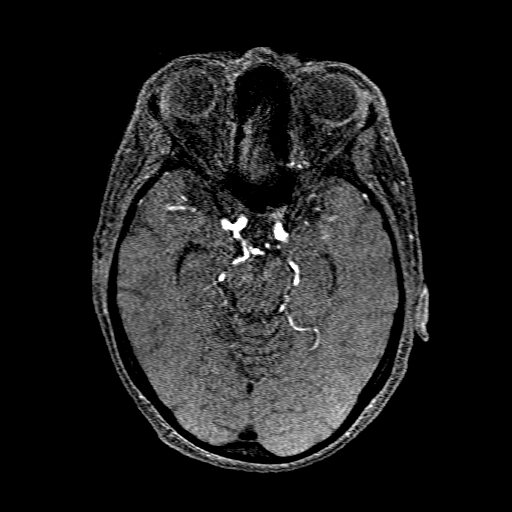
[im 99/176]
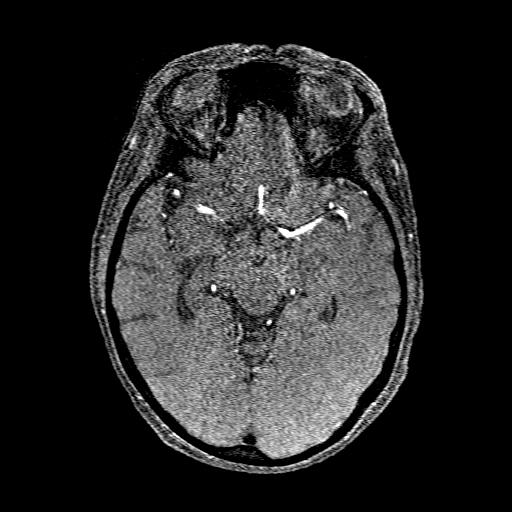
[im 122/176]
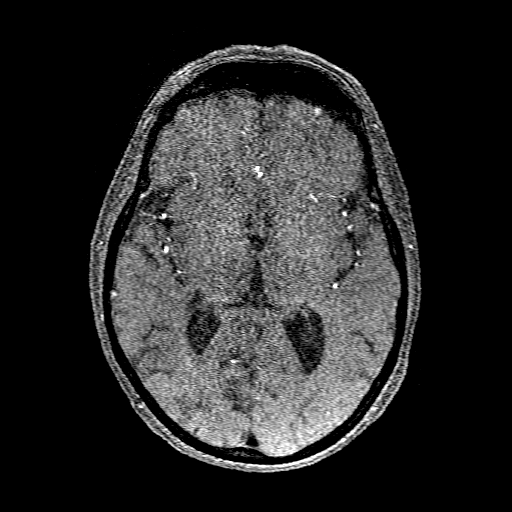
[im 145/176]
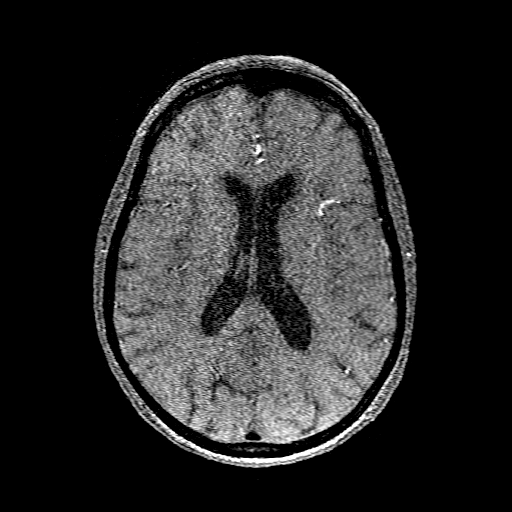
[im 149/176]
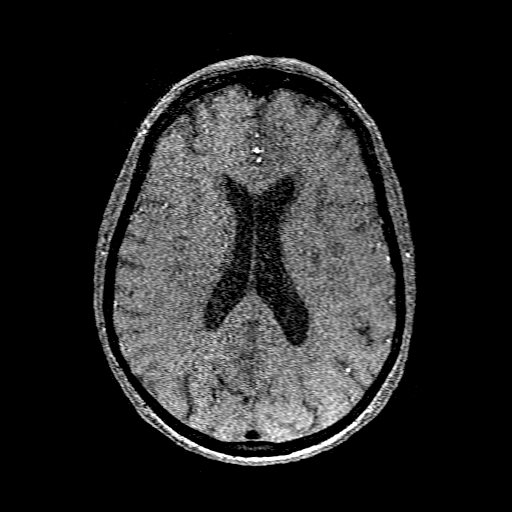
[im 168/176]
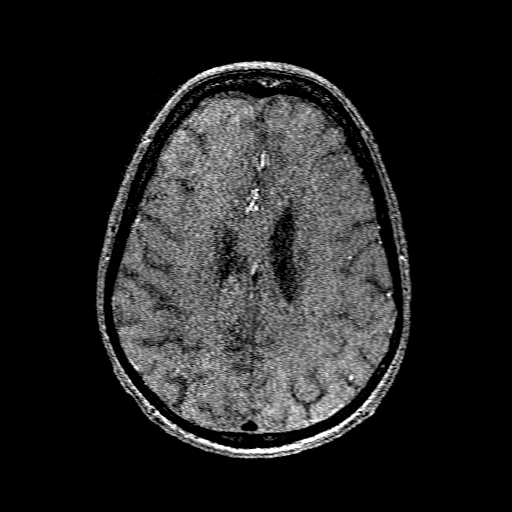

[Series 301: pjn:ax (id) · sagittal · 1.0mm · 0.43mm/px · 1 of 5 slices shown]
[im 1/5]
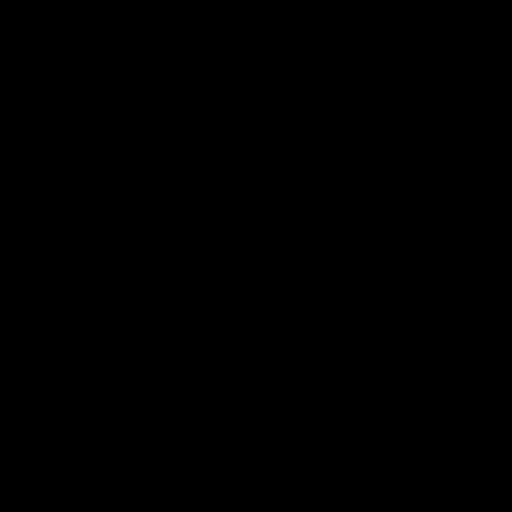

[17 of 48 positions shown; findings below may reference images not displayed]

FINDINGS: MRI HEAD FINDINGS

Brain: Diffusion-weighted images demonstrate acute nonhemorrhagic
infarcts the left corona radiata corresponding to the abnormality on
CT. Acute nonhemorrhagic infarct is present in the posterior limb of
the right internal capsule measuring 6 mm. Focal restricted
diffusion is also noted along the dorsal aspect of the posterior
limb right internal capsule. No hemorrhage is associated with these
infarcts.

An area of more remote infarction is evident adjacent to the atrium
of the right lateral ventricle. Mild periventricular white matter
changes are present bilaterally, compatible with age.

The ventricles are of normal size. No significant extraaxial fluid
collection is present.

Mild white matter changes extend into the brainstem. Remote lacunar
infarct present the right cerebellum.

Vascular: Flow is present in the major intracranial arteries.

Skull and upper cervical spine: The craniocervical junction is
normal. Upper cervical spine is within normal limits. Marrow signal
is unremarkable.

Sinuses/Orbits: Bilateral mastoid effusions are present. No
obstructing nasopharyngeal lesion is present. The paranasal sinuses
and mastoid air cells are otherwise clear. Bilateral lens
replacements are noted. Globes and orbits are otherwise
unremarkable.

MRA HEAD FINDINGS

Atherosclerotic irregularity is present within the cavernous
internal carotid arteries bilaterally. A 2.4 mm left posterior
communicating artery aneurysm present. The A1 and M1 segments are
normal. MCA bifurcations are intact. There is some segmental
narrowing of distal ACA and MCA branch vessels without significant
proximal stenosis or occlusion.

The vertebral arteries are codominant. Vertebrobasilar junction is
normal. The left posterior cerebral artery originates from basilar
tip. The right posterior cerebral artery is of fetal type. There is
some attenuation of distal branch vessels without significant
proximal stenosis or occlusion.

MRA NECK FINDINGS

Time-of-flight and enhance images demonstrated 3 4 vessel arch
configuration. There is mild tortuosity of the right common carotid
artery. Bifurcation is unremarkable. Cervical right ICA is normal.

The left common carotid artery is within normal limits. Mild
atherosclerotic irregularity is present at bifurcation without
significant stenosis. Cervical left ICA is otherwise normal.

Vertebral arteries originate from the subclavian arteries
bilaterally without significant stenosis. Left vertebral artery is
slightly dominant to the right. No significant stenosis is present.
IMPRESSION: 1. Acute nonhemorrhagic infarcts involving the posterior limb of the
right internal capsule and left corona radiata.
2. Acute nonhemorrhagic infarct along the dorsal aspect of the
posterior limb right internal capsule. This represents acute on
chronic infarction with adjacent encephalomalacia.
3. Multifocality of infarcts suggest a central source. Question fat
emboli related to hemorrhages. No focal vascular etiology evident.
4. No other acute intracranial abnormality.
5. Mild distal small vessel disease without significant proximal
stenosis, occlusion, or branch vessel occlusion within the Circle of
[place]. This likely reflects some degree of intracranial
atherosclerosis.
6. 2.4 mm left posterior communicating artery aneurysm.
7. No significant stenosis in the neck.

## 2020-12-18 IMAGING — MR MR MRA NECK W/O CM
1 of 3 series · 17 of 48 positions shown · IV contrast (gadavist)
Comparison: CT head without contrast [DATE]

CLINICAL DATA: Head trauma. Abnormal head CT with new white matter
hypoattenuation. Possible acute stroke.

EXAM:
MRI HEAD WITHOUT AND WITH CONTRAST
MRA HEAD WITHOUT CONTRAST
MRA NECK WITHOUT CONTRAST
TECHNIQUE: Multiplanar, multiecho pulse sequences of the brain and surrounding
structures were obtained without and with intravenous contrast.
Angiographic images of the Circle of [place] were obtained using MRA
technique without intravenous contrast. Angiographic images of the
neck were obtained using MRA technique without and with intravenous
contrast. Carotid stenosis measurements (when applicable) are
obtained utilizing NASCET criteria, using the distal internal
carotid diameter as the denominator.
CONTRAST:  7.5mL GADAVIST GADOBUTROL 1 MMOL/ML IV SOLN

[Series 8: sag inhance (id) · sagittal · 1.2mm · 0.47mm/px · 17 of 376 slices shown]
[im 1/376]
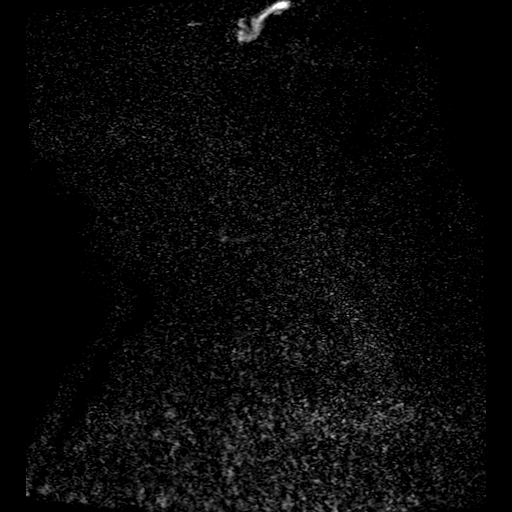
[im 12/376]
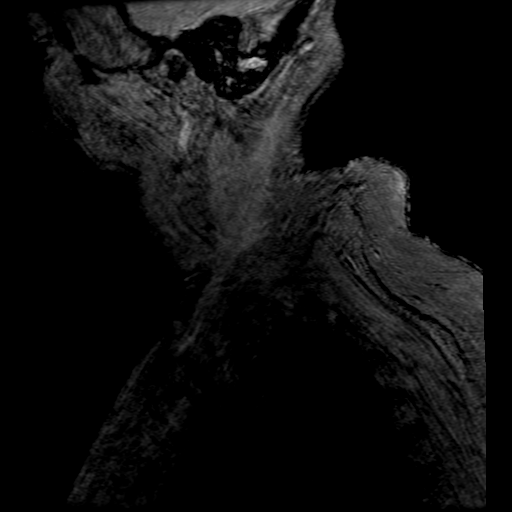
[im 23/376]
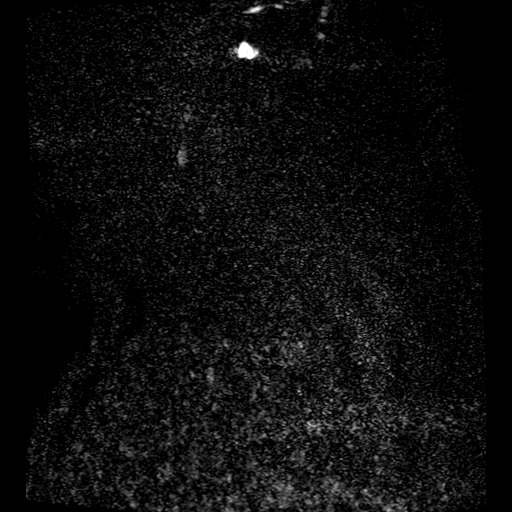
[im 35/376]
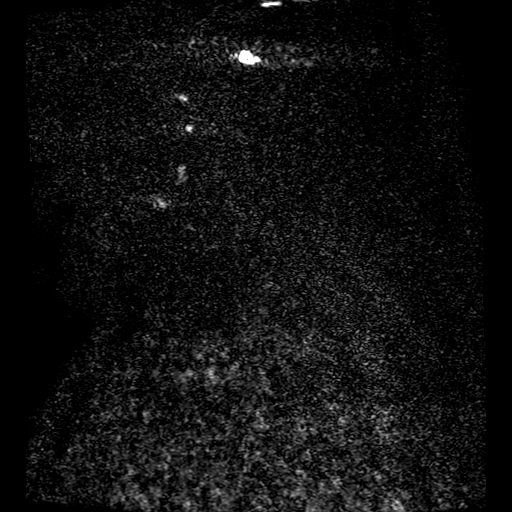
[im 46/376]
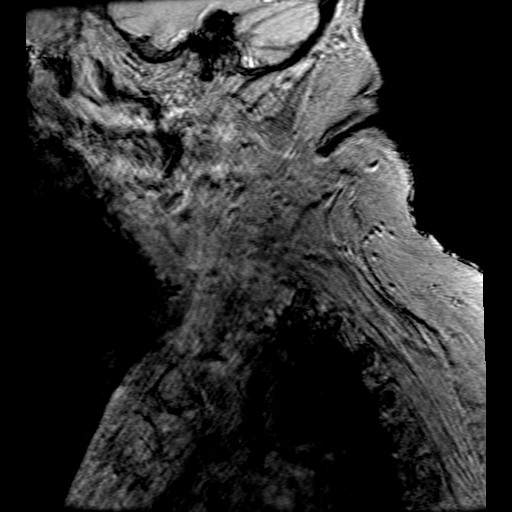
[im 57/376]
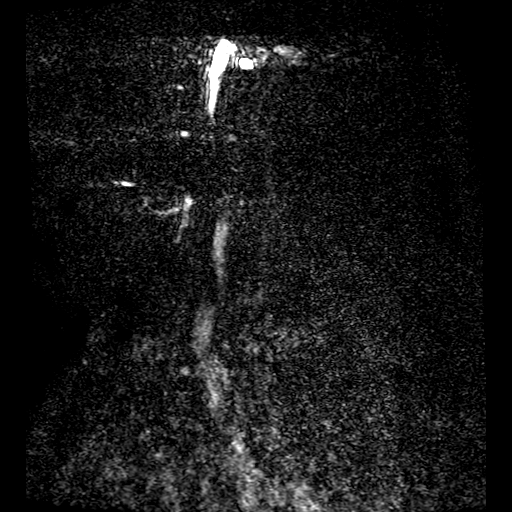
[im 69/376]
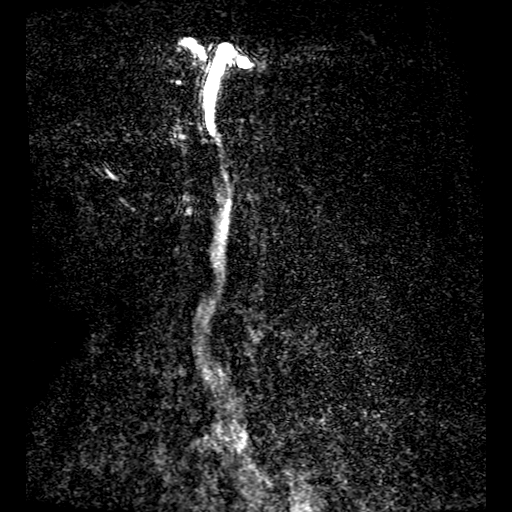
[im 80/376]
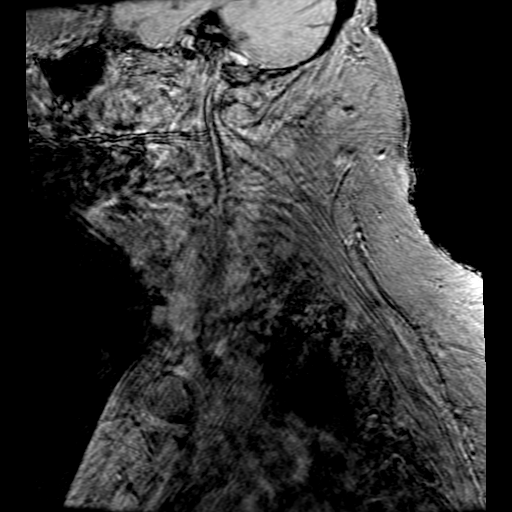
[im 91/376]
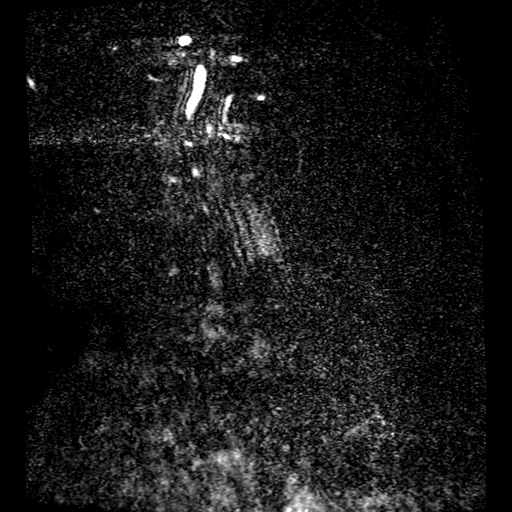
[im 114/376]
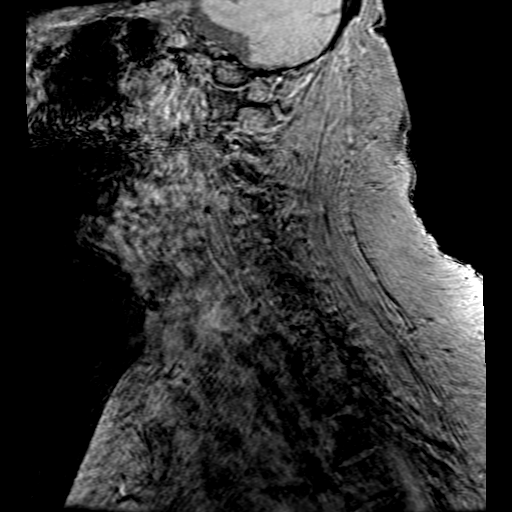
[im 160/376]
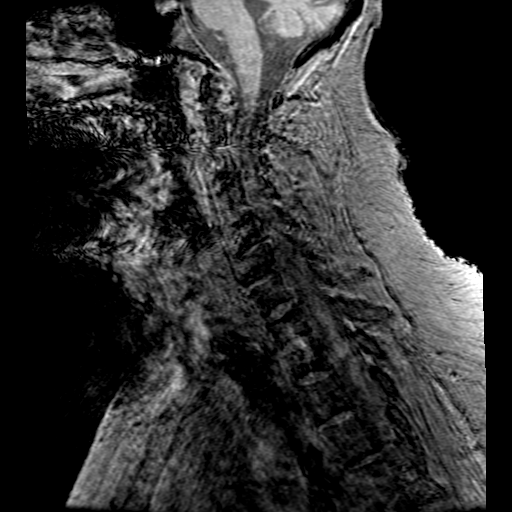
[im 194/376]
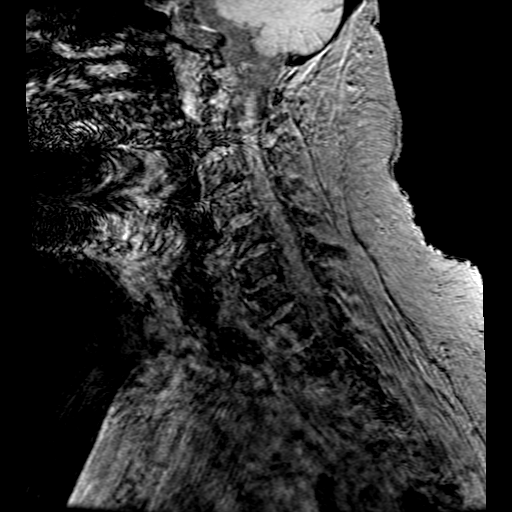
[im 216/376]
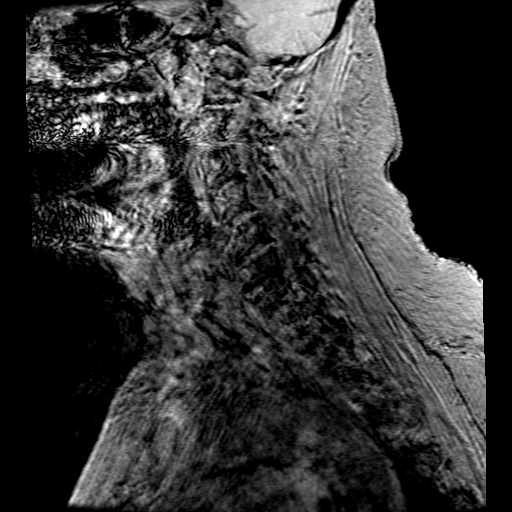
[im 262/376]
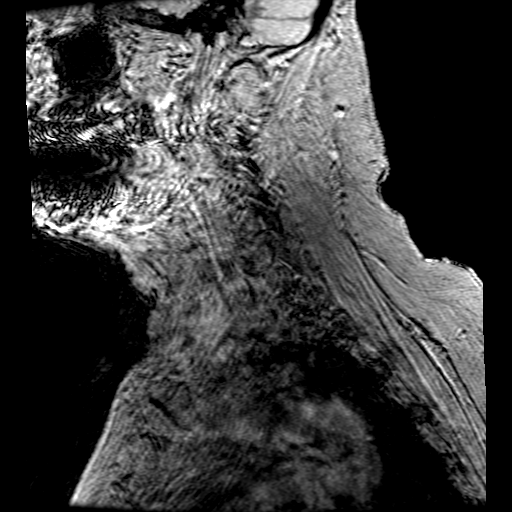
[im 307/376]
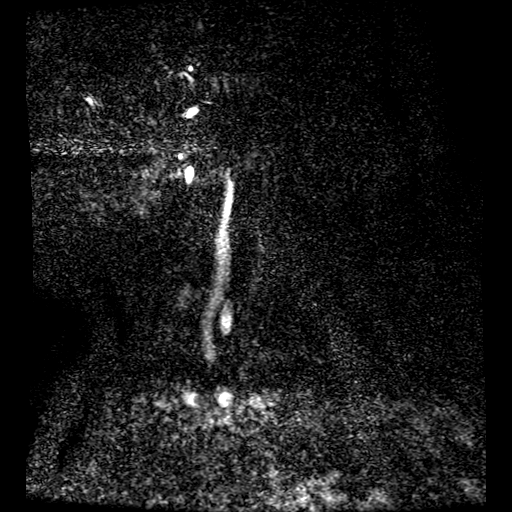
[im 319/376]
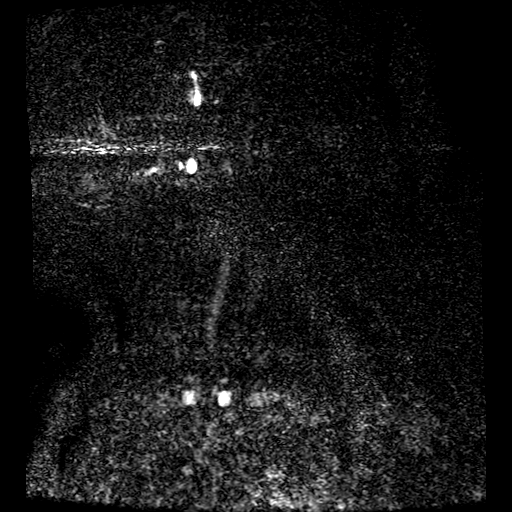
[im 353/376]
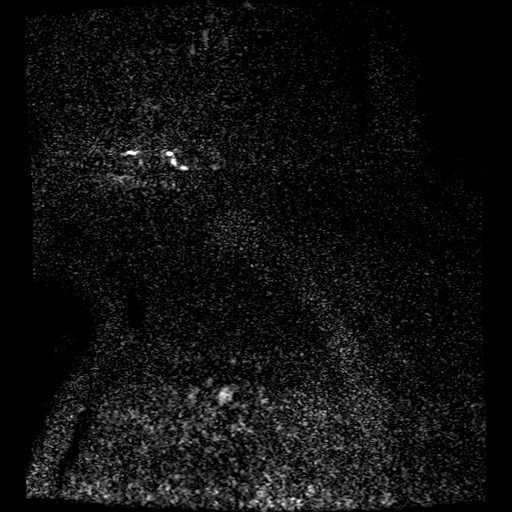

[17 of 48 positions shown; findings below may reference images not displayed]

FINDINGS: MRI HEAD FINDINGS

Brain: Diffusion-weighted images demonstrate acute nonhemorrhagic
infarcts the left corona radiata corresponding to the abnormality on
CT. Acute nonhemorrhagic infarct is present in the posterior limb of
the right internal capsule measuring 6 mm. Focal restricted
diffusion is also noted along the dorsal aspect of the posterior
limb right internal capsule. No hemorrhage is associated with these
infarcts.

An area of more remote infarction is evident adjacent to the atrium
of the right lateral ventricle. Mild periventricular white matter
changes are present bilaterally, compatible with age.

The ventricles are of normal size. No significant extraaxial fluid
collection is present.

Mild white matter changes extend into the brainstem. Remote lacunar
infarct present the right cerebellum.

Vascular: Flow is present in the major intracranial arteries.

Skull and upper cervical spine: The craniocervical junction is
normal. Upper cervical spine is within normal limits. Marrow signal
is unremarkable.

Sinuses/Orbits: Bilateral mastoid effusions are present. No
obstructing nasopharyngeal lesion is present. The paranasal sinuses
and mastoid air cells are otherwise clear. Bilateral lens
replacements are noted. Globes and orbits are otherwise
unremarkable.

MRA HEAD FINDINGS

Atherosclerotic irregularity is present within the cavernous
internal carotid arteries bilaterally. A 2.4 mm left posterior
communicating artery aneurysm present. The A1 and M1 segments are
normal. MCA bifurcations are intact. There is some segmental
narrowing of distal ACA and MCA branch vessels without significant
proximal stenosis or occlusion.

The vertebral arteries are codominant. Vertebrobasilar junction is
normal. The left posterior cerebral artery originates from basilar
tip. The right posterior cerebral artery is of fetal type. There is
some attenuation of distal branch vessels without significant
proximal stenosis or occlusion.

MRA NECK FINDINGS

Time-of-flight and enhance images demonstrated 3 4 vessel arch
configuration. There is mild tortuosity of the right common carotid
artery. Bifurcation is unremarkable. Cervical right ICA is normal.

The left common carotid artery is within normal limits. Mild
atherosclerotic irregularity is present at bifurcation without
significant stenosis. Cervical left ICA is otherwise normal.

Vertebral arteries originate from the subclavian arteries
bilaterally without significant stenosis. Left vertebral artery is
slightly dominant to the right. No significant stenosis is present.
IMPRESSION: 1. Acute nonhemorrhagic infarcts involving the posterior limb of the
right internal capsule and left corona radiata.
2. Acute nonhemorrhagic infarct along the dorsal aspect of the
posterior limb right internal capsule. This represents acute on
chronic infarction with adjacent encephalomalacia.
3. Multifocality of infarcts suggest a central source. Question fat
emboli related to hemorrhages. No focal vascular etiology evident.
4. No other acute intracranial abnormality.
5. Mild distal small vessel disease without significant proximal
stenosis, occlusion, or branch vessel occlusion within the Circle of
[place]. This likely reflects some degree of intracranial
atherosclerosis.
6. 2.4 mm left posterior communicating artery aneurysm.
7. No significant stenosis in the neck.

## 2020-12-18 IMAGING — MR MR HEAD WO/W CM
6 of 12 series · 27 of 48 positions shown · IV contrast (YES GAD)
Comparison: CT head without contrast [DATE]

CLINICAL DATA: Head trauma. Abnormal head CT with new white matter
hypoattenuation. Possible acute stroke.

EXAM:
MRI HEAD WITHOUT AND WITH CONTRAST
MRA HEAD WITHOUT CONTRAST
MRA NECK WITHOUT CONTRAST
TECHNIQUE: Multiplanar, multiecho pulse sequences of the brain and surrounding
structures were obtained without and with intravenous contrast.
Angiographic images of the Circle of Willis were obtained using MRA
technique without intravenous contrast. Angiographic images of the
neck were obtained using MRA technique without and with intravenous
contrast. Carotid stenosis measurements (when applicable) are
obtained utilizing NASCET criteria, using the distal internal
carotid diameter as the denominator.
CONTRAST:  7.5mL GADAVIST GADOBUTROL 1 MMOL/ML IV SOLN

[Series 2: DWI · axial · 3.0mm · 0.94mm/px · z∈[-70,+72]mm · 9 of 99 slices shown (1 of 2)]
[im 1/99]
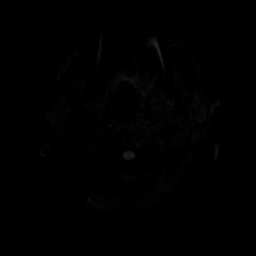
[im 13/99]
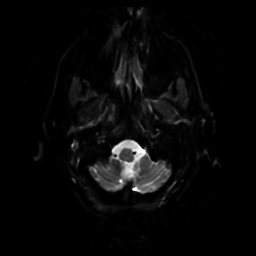
[im 25/99]
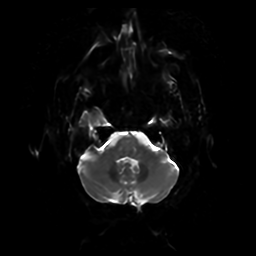
[im 37/99]
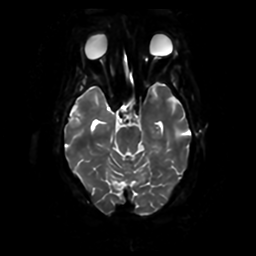
[im 50/99]
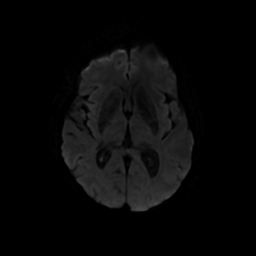
[im 62/99]
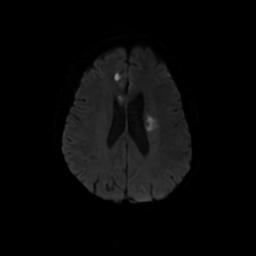
[im 74/99]
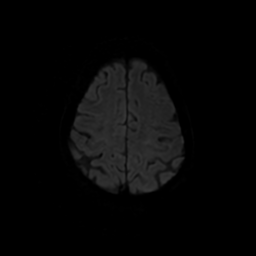
[im 86/99]
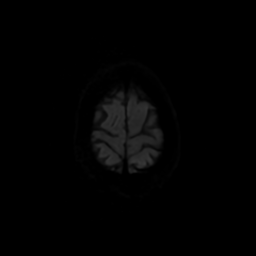
[im 99/99]
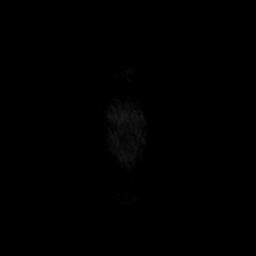

[Series 4: DWI · coronal · 4.0mm · 0.94mm/px · 6 of 70 slices shown (2 of 2)]
[im 1/70]
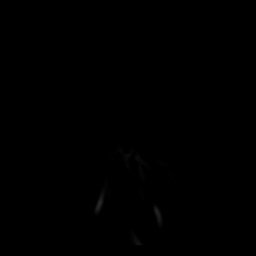
[im 14/70]
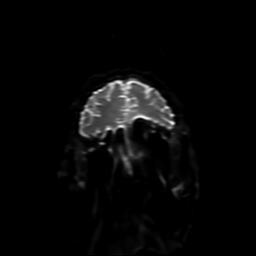
[im 28/70]
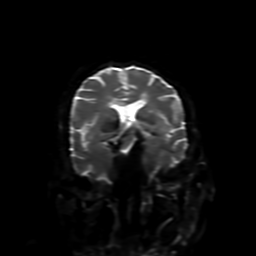
[im 42/70]
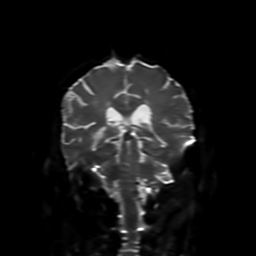
[im 56/70]
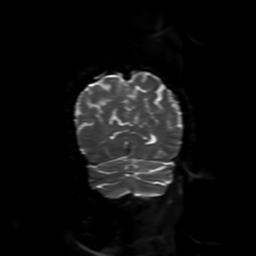
[im 70/70]
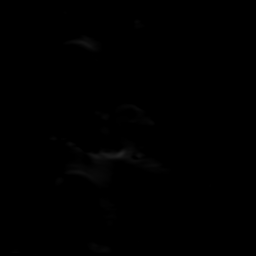

[Series 6: FLAIR · sagittal · 5.0mm · 0.23mm/px · 2 of 23 slices shown (1 of 2)]
[im 1/23]
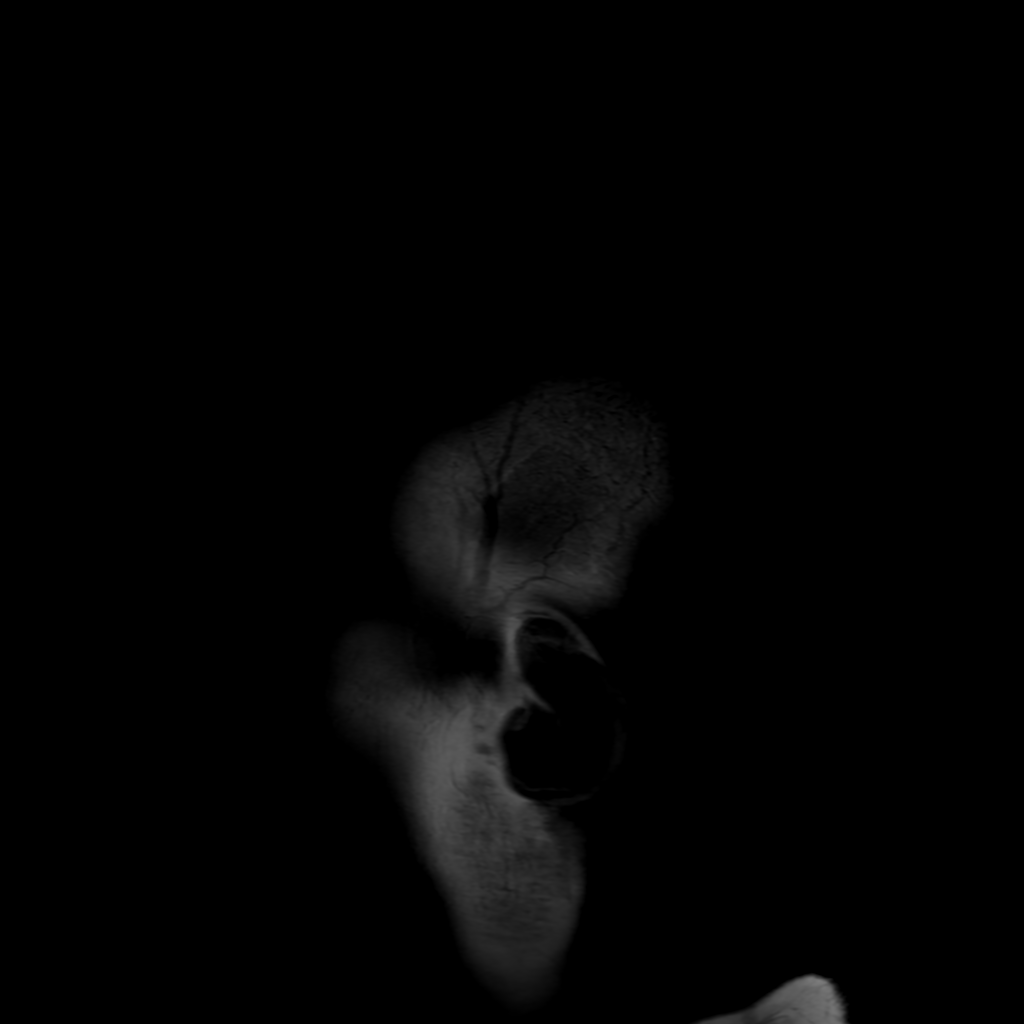
[im 23/23]
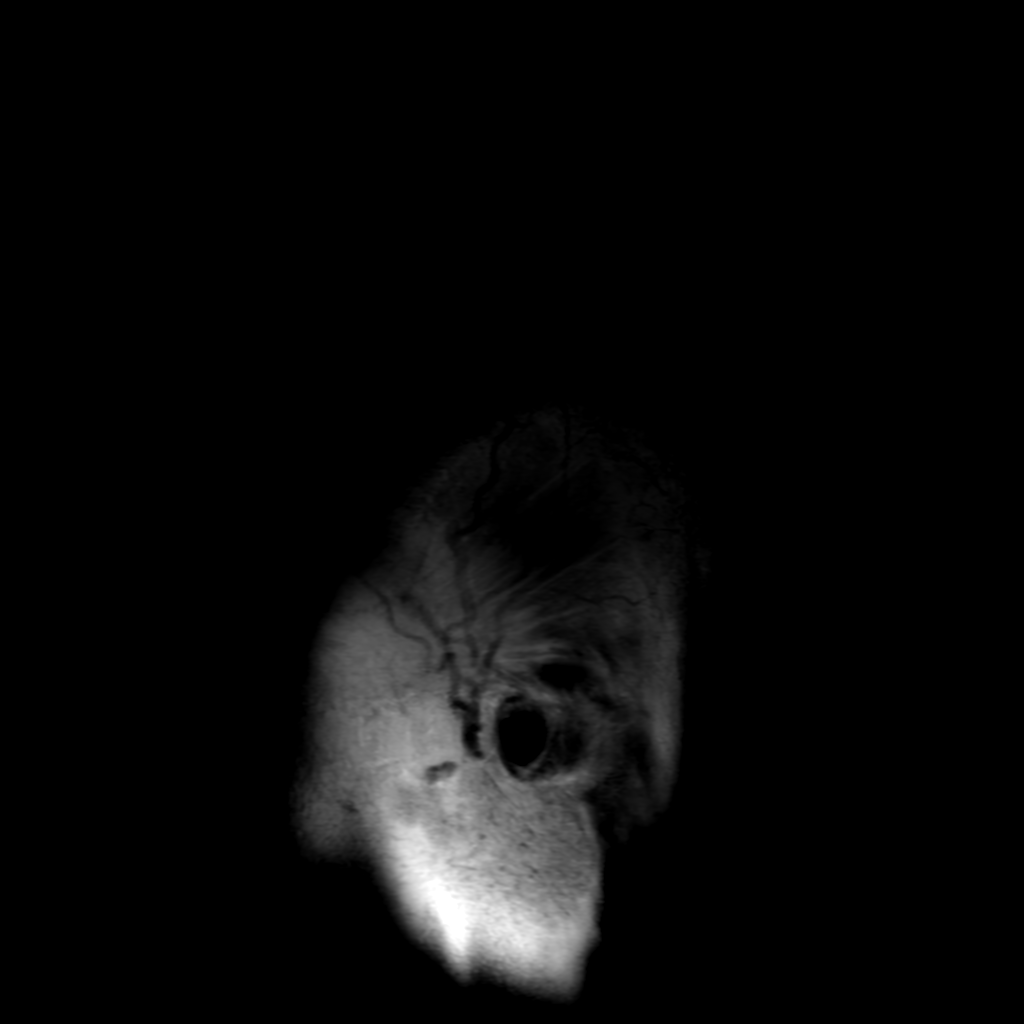

[Series 11: FLAIR · axial · 5.0mm · 0.47mm/px · z∈[-68,+70]mm · 2 of 25 slices shown (2 of 2)]
[im 1/25]
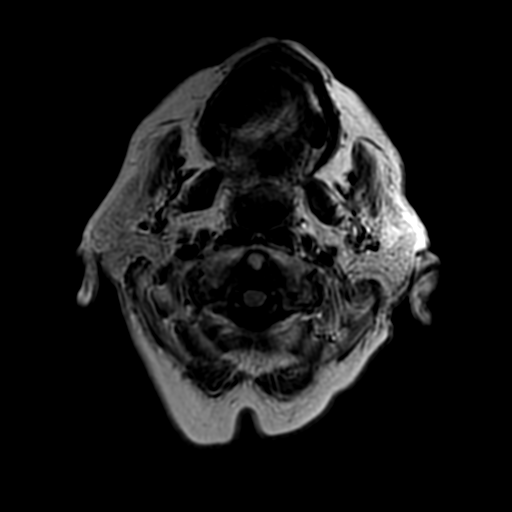
[im 25/25]
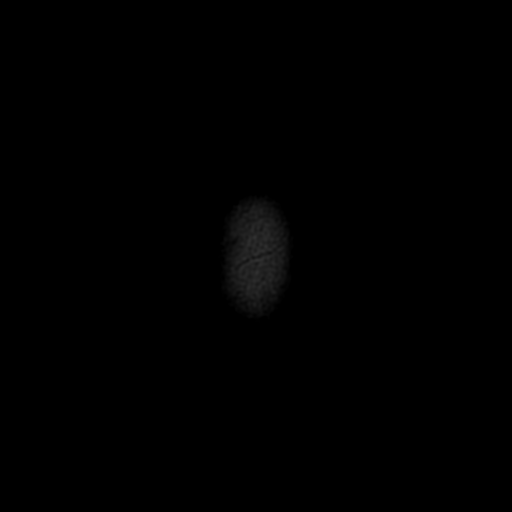

[Series 250: ADC · axial · 3.0mm · 0.94mm/px · z∈[-70,+72]mm · 5 of 50 slices shown (1 of 2)]
[im 1/50]
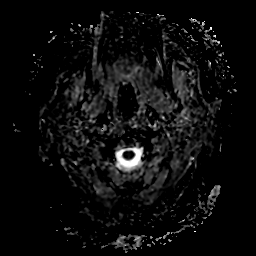
[im 13/50]
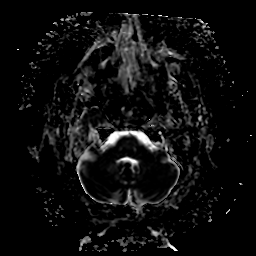
[im 25/50]
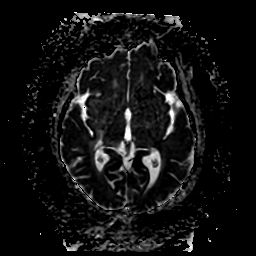
[im 37/50]
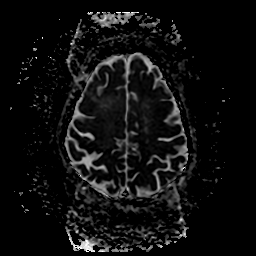
[im 50/50]
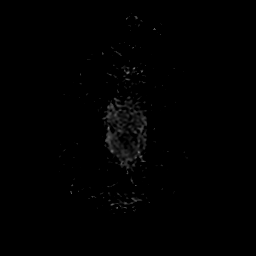

[Series 450: ADC · coronal · 4.0mm · 0.94mm/px · 3 of 32 slices shown (2 of 2)]
[im 1/32]
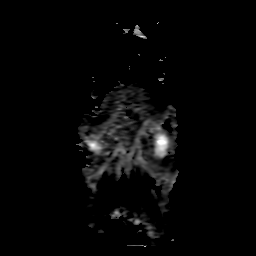
[im 16/32]
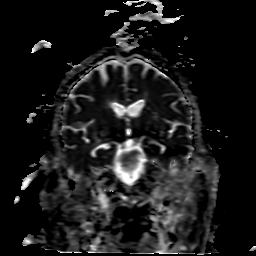
[im 32/32]
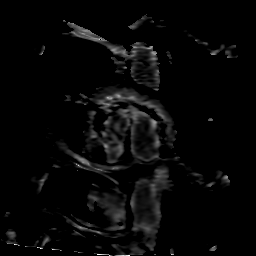

[27 of 48 positions shown; findings below may reference images not displayed]

FINDINGS: MRI HEAD FINDINGS

Brain: Diffusion-weighted images demonstrate acute nonhemorrhagic
infarcts the left corona radiata corresponding to the abnormality on
CT. Acute nonhemorrhagic infarct is present in the posterior limb of
the right internal capsule measuring 6 mm. Focal restricted
diffusion is also noted along the dorsal aspect of the posterior
limb right internal capsule. No hemorrhage is associated with these
infarcts.

An area of more remote infarction is evident adjacent to the atrium
of the right lateral ventricle. Mild periventricular white matter
changes are present bilaterally, compatible with age.

The ventricles are of normal size. No significant extraaxial fluid
collection is present.

Mild white matter changes extend into the brainstem. Remote lacunar
infarct present the right cerebellum.

Vascular: Flow is present in the major intracranial arteries.

Skull and upper cervical spine: The craniocervical junction is
normal. Upper cervical spine is within normal limits. Marrow signal
is unremarkable.

Sinuses/Orbits: Bilateral mastoid effusions are present. No
obstructing nasopharyngeal lesion is present. The paranasal sinuses
and mastoid air cells are otherwise clear. Bilateral lens
replacements are noted. Globes and orbits are otherwise
unremarkable.

MRA HEAD FINDINGS

Atherosclerotic irregularity is present within the cavernous
internal carotid arteries bilaterally. A 2.4 mm left posterior
communicating artery aneurysm present. The A1 and M1 segments are
normal. MCA bifurcations are intact. There is some segmental
narrowing of distal ACA and MCA branch vessels without significant
proximal stenosis or occlusion.

The vertebral arteries are codominant. Vertebrobasilar junction is
normal. The left posterior cerebral artery originates from basilar
tip. The right posterior cerebral artery is of fetal type. There is
some attenuation of distal branch vessels without significant
proximal stenosis or occlusion.

MRA NECK FINDINGS

Time-of-flight and enhance images demonstrated 3 4 vessel arch
configuration. There is mild tortuosity of the right common carotid
artery. Bifurcation is unremarkable. Cervical right ICA is normal.

The left common carotid artery is within normal limits. Mild
atherosclerotic irregularity is present at bifurcation without
significant stenosis. Cervical left ICA is otherwise normal.

Vertebral arteries originate from the subclavian arteries
bilaterally without significant stenosis. Left vertebral artery is
slightly dominant to the right. No significant stenosis is present.
IMPRESSION: 1. Acute nonhemorrhagic infarcts involving the posterior limb of the
right internal capsule and left corona radiata.
2. Acute nonhemorrhagic infarct along the dorsal aspect of the
posterior limb right internal capsule. This represents acute on
chronic infarction with adjacent encephalomalacia.
3. Multifocality of infarcts suggest a central source. Question fat
emboli related to hemorrhages. No focal vascular etiology evident.
4. No other acute intracranial abnormality.
5. Mild distal small vessel disease without significant proximal
stenosis, occlusion, or branch vessel occlusion within the Circle of
[place]. This likely reflects some degree of intracranial
atherosclerosis.
6. 2.4 mm left posterior communicating artery aneurysm.
7. No significant stenosis in the neck.

## 2020-12-18 MED ORDER — CLOPIDOGREL BISULFATE 75 MG PO TABS
75.0000 mg | ORAL_TABLET | Freq: Every day | ORAL | Status: DC
  Administered 2020-12-18 – 2020-12-25 (×8): 75 mg via ORAL
  Filled 2020-12-18 (×8): qty 1

## 2020-12-18 MED ORDER — AMLODIPINE BESYLATE 10 MG PO TABS
10.0000 mg | ORAL_TABLET | Freq: Every day | ORAL | Status: DC
  Administered 2020-12-18: 10 mg via ORAL
  Filled 2020-12-18: qty 2

## 2020-12-18 MED ORDER — ALBUTEROL SULFATE (2.5 MG/3ML) 0.083% IN NEBU: 2.5000 mg | INHALATION_SOLUTION | Freq: Four times a day (QID) | RESPIRATORY_TRACT | Status: DC | PRN

## 2020-12-18 MED ORDER — ENOXAPARIN SODIUM 40 MG/0.4ML IJ SOSY
40.0000 mg | PREFILLED_SYRINGE | INTRAMUSCULAR | Status: DC
  Administered 2020-12-18 – 2020-12-19 (×2): 40 mg via SUBCUTANEOUS
  Filled 2020-12-18 (×2): qty 0.4

## 2020-12-18 MED ORDER — ACETAMINOPHEN 325 MG PO TABS
650.0000 mg | ORAL_TABLET | Freq: Four times a day (QID) | ORAL | Status: DC
  Administered 2020-12-18 – 2020-12-25 (×27): 650 mg via ORAL
  Filled 2020-12-18 (×27): qty 2

## 2020-12-18 MED ORDER — GADOBUTROL 1 MMOL/ML IV SOLN
7.5000 mL | Freq: Once | INTRAVENOUS | Status: AC | PRN
  Administered 2020-12-18: 7.5 mL via INTRAVENOUS

## 2020-12-18 MED ORDER — FENTANYL CITRATE (PF) 100 MCG/2ML IJ SOLN
50.0000 ug | Freq: Once | INTRAMUSCULAR | Status: AC
  Administered 2020-12-18: 50 ug via INTRAVENOUS
  Filled 2020-12-18: qty 2

## 2020-12-18 MED ORDER — HYDROCODONE-ACETAMINOPHEN 5-325 MG PO TABS
1.0000 | ORAL_TABLET | Freq: Four times a day (QID) | ORAL | Status: DC | PRN
  Administered 2020-12-18 (×2): 1 via ORAL
  Filled 2020-12-18 (×2): qty 1

## 2020-12-18 MED ORDER — SERTRALINE HCL 50 MG PO TABS
25.0000 mg | ORAL_TABLET | Freq: Every day | ORAL | Status: DC
  Administered 2020-12-19 – 2020-12-25 (×7): 25 mg via ORAL
  Filled 2020-12-18 (×7): qty 1

## 2020-12-18 MED ORDER — INDAPAMIDE 1.25 MG PO TABS
1.2500 mg | ORAL_TABLET | Freq: Every day | ORAL | Status: DC
  Filled 2020-12-18: qty 1

## 2020-12-18 MED ORDER — CARVEDILOL 6.25 MG PO TABS
6.2500 mg | ORAL_TABLET | Freq: Two times a day (BID) | ORAL | Status: DC
  Administered 2020-12-18: 6.25 mg via ORAL
  Filled 2020-12-18: qty 2

## 2020-12-18 MED ORDER — MIDAZOLAM HCL 2 MG/2ML IJ SOLN: 1.0000 mg | Freq: Once | INTRAMUSCULAR | Status: DC | PRN

## 2020-12-18 MED ORDER — ATORVASTATIN CALCIUM 40 MG PO TABS
40.0000 mg | ORAL_TABLET | Freq: Every day | ORAL | Status: DC
  Administered 2020-12-18: 40 mg via ORAL
  Filled 2020-12-18: qty 1

## 2020-12-18 MED ORDER — HYDRALAZINE HCL 50 MG PO TABS
50.0000 mg | ORAL_TABLET | Freq: Once | ORAL | Status: AC
  Administered 2020-12-18: 50 mg via ORAL
  Filled 2020-12-18: qty 1

## 2020-12-18 MED ORDER — SUCRALFATE 1 G PO TABS
1.0000 g | ORAL_TABLET | Freq: Every day | ORAL | Status: DC
  Administered 2020-12-18 – 2020-12-24 (×7): 1 g via ORAL
  Filled 2020-12-18 (×7): qty 1

## 2020-12-18 MED ORDER — ATORVASTATIN CALCIUM 80 MG PO TABS
80.0000 mg | ORAL_TABLET | Freq: Every day | ORAL | Status: DC
  Administered 2020-12-19 – 2020-12-24 (×6): 80 mg via ORAL
  Filled 2020-12-18 (×6): qty 1

## 2020-12-18 MED ORDER — FAMOTIDINE 10 MG PO TABS
10.0000 mg | ORAL_TABLET | Freq: Every day | ORAL | Status: DC
  Administered 2020-12-19 – 2020-12-25 (×7): 10 mg via ORAL
  Filled 2020-12-18 (×8): qty 1

## 2020-12-18 MED ORDER — ASPIRIN EC 81 MG PO TBEC
81.0000 mg | DELAYED_RELEASE_TABLET | Freq: Every day | ORAL | Status: DC
  Administered 2020-12-18 – 2020-12-25 (×8): 81 mg via ORAL
  Filled 2020-12-18 (×8): qty 1

## 2020-12-18 MED ORDER — OXYCODONE HCL 5 MG PO TABS
5.0000 mg | ORAL_TABLET | ORAL | Status: AC
Start: 2020-12-18 — End: 2020-12-19
  Administered 2020-12-18 – 2020-12-19 (×3): 5 mg via ORAL
  Filled 2020-12-18 (×3): qty 1

## 2020-12-18 NOTE — ED Notes (Signed)
Pt stated she wanted to sit at side of bed. Remove pt from O2. Pt is at 96% on room air.
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
## 2020-12-18 NOTE — Progress Notes (Signed)
Pt checked by SRN. No visual or verbal deficits noted. MAE well.  Visisted a while, assisted to [facility] and back.

## 2020-12-18 NOTE — Significant Event (Signed)
Family medicine teaching service will be admitting this patient. Our pager information can be located in the physician sticky notes, treatment team sticky notes, and the headers of all our official daily progress notes.   FAMILY MEDICINE TEACHING SERVICE Patient - Please contact intern pager [phone] or text page via website [email or web address] (login: [email or web address]) for questions regarding care. DO NOT page listed attending provider unless there is no answer from the number above.   Lauren Thurmond, MD PGY-2, [place] [facility] Service pager [phone]   

## 2020-12-18 NOTE — ED Notes (Signed)
Switched pulse ox to left hand. O2 at 95%

## 2020-12-18 NOTE — Telephone Encounter (Signed)
Patient seen in ED to check in after [facility] and phone call to clinic. Appreciate ED provider care of this patient.  Dorris Singh, MD  Family Medicine Teaching Service

## 2020-12-18 NOTE — ED Notes (Signed)
Assisted pt in using BSC.

## 2020-12-18 NOTE — ED Notes (Addendum)
Assisted pt back into bed. Provided pt handwarmers.

## 2020-12-18 NOTE — ED Notes (Signed)
Dr Owens Shark, pts PMD at bedside
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
## 2020-12-18 NOTE — Plan of Care (Signed)

## 2020-12-18 NOTE — ED Notes (Signed)
Pt assisted to bedside commode
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
## 2020-12-18 NOTE — ED Notes (Signed)
Pt vomited at the bedside. Cleaned pt mouth, provided wet cloth, warm blanket, and ice chips.
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
## 2020-12-18 NOTE — ED Notes (Signed)
Attempt to call report, nurse unable to come to the phone at this time. Will try again.
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
## 2020-12-19 ENCOUNTER — Observation Stay (HOSPITAL_COMMUNITY): Payer: PPO

## 2020-12-19 ENCOUNTER — Other Ambulatory Visit: Payer: Self-pay | Admitting: Medical

## 2020-12-19 DIAGNOSIS — I679 Cerebrovascular disease, unspecified: Secondary | ICD-10-CM

## 2020-12-19 DIAGNOSIS — I6340 Cerebral infarction due to embolism of unspecified cerebral artery: Secondary | ICD-10-CM | POA: Diagnosis not present

## 2020-12-19 DIAGNOSIS — Z8673 Personal history of transient ischemic attack (TIA), and cerebral infarction without residual deficits: Secondary | ICD-10-CM | POA: Diagnosis not present

## 2020-12-19 DIAGNOSIS — I6389 Other cerebral infarction: Secondary | ICD-10-CM

## 2020-12-19 DIAGNOSIS — I639 Cerebral infarction, unspecified: Secondary | ICD-10-CM

## 2020-12-19 LAB — BASIC METABOLIC PANEL
Anion gap: 6 (ref 5–15)
BUN: 19 mg/dL (ref 8–23)
CO2: 24 mmol/L (ref 22–32)
Calcium: 8.7 mg/dL — ABNORMAL LOW (ref 8.9–10.3)
Chloride: 105 mmol/L (ref 98–111)
Creatinine, Ser: 1.5 mg/dL — ABNORMAL HIGH (ref 0.44–1.00)
GFR, Estimated: 36 mL/min — ABNORMAL LOW (ref >60)
Glucose, Bld: 170 mg/dL — ABNORMAL HIGH (ref 70–99)
Potassium: 4.6 mmol/L (ref 3.5–5.1)
Sodium: 135 mmol/L (ref 135–145)

## 2020-12-19 LAB — CBC
HCT: 31.6 % — ABNORMAL LOW (ref 36.0–46.0)
Hemoglobin: 9.8 g/dL — ABNORMAL LOW (ref 12.0–15.0)
MCH: 25.9 pg — ABNORMAL LOW (ref 26.0–34.0)
MCHC: 31 g/dL (ref 30.0–36.0)
MCV: 83.4 fL (ref 80.0–100.0)
Platelets: 222 10*3/uL (ref 150–400)
RBC: 3.79 MIL/uL — ABNORMAL LOW (ref 3.87–5.11)
RDW: 14.5 % (ref 11.5–15.5)
WBC: 8 10*3/uL (ref 4.0–10.5)
nRBC: 0 % (ref 0.0–0.2)

## 2020-12-19 MED ORDER — OXYCODONE HCL 5 MG PO TABS
5.0000 mg | ORAL_TABLET | ORAL | Status: DC | PRN
  Administered 2020-12-20 (×2): 5 mg via ORAL
  Filled 2020-12-19 (×2): qty 1
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
## 2020-12-19 NOTE — Progress Notes (Signed)
EEG completed, results pending. 

## 2020-12-19 NOTE — Progress Notes (Signed)
  Echocardiogram 2D Echocardiogram has been performed.  Merrie Roof F 12/19/2020, 5:48 PM
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
## 2020-12-19 NOTE — Progress Notes (Signed)
   30-day monitor requested to further evaluation CVA. Patient does not follow with [facility]. Will place order for Dr. Curt Bears to read as he is DOD 12/21/20. Will route to schedulers to arrange a 2 month follow-up to review results.   Abigail Butts, PA-C 12/19/20; 3:27 PM
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
## 2020-12-19 NOTE — Plan of Care (Signed)
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
## 2020-12-20 ENCOUNTER — Inpatient Hospital Stay (HOSPITAL_COMMUNITY): Payer: PPO

## 2020-12-20 DIAGNOSIS — R609 Edema, unspecified: Secondary | ICD-10-CM | POA: Diagnosis not present

## 2020-12-20 DIAGNOSIS — I63421 Cerebral infarction due to embolism of right anterior cerebral artery: Secondary | ICD-10-CM | POA: Diagnosis present

## 2020-12-20 DIAGNOSIS — I639 Cerebral infarction, unspecified: Secondary | ICD-10-CM | POA: Diagnosis not present

## 2020-12-20 DIAGNOSIS — S22019A Unspecified fracture of first thoracic vertebra, initial encounter for closed fracture: Secondary | ICD-10-CM | POA: Diagnosis present

## 2020-12-20 DIAGNOSIS — F419 Anxiety disorder, unspecified: Secondary | ICD-10-CM | POA: Diagnosis present

## 2020-12-20 DIAGNOSIS — S22029A Unspecified fracture of second thoracic vertebra, initial encounter for closed fracture: Secondary | ICD-10-CM | POA: Diagnosis present

## 2020-12-20 DIAGNOSIS — I672 Cerebral atherosclerosis: Secondary | ICD-10-CM | POA: Diagnosis present

## 2020-12-20 DIAGNOSIS — N183 Chronic kidney disease, stage 3 unspecified: Secondary | ICD-10-CM | POA: Diagnosis not present

## 2020-12-20 DIAGNOSIS — I129 Hypertensive chronic kidney disease with stage 1 through stage 4 chronic kidney disease, or unspecified chronic kidney disease: Secondary | ICD-10-CM | POA: Diagnosis present

## 2020-12-20 DIAGNOSIS — L03116 Cellulitis of left lower limb: Secondary | ICD-10-CM | POA: Diagnosis not present

## 2020-12-20 DIAGNOSIS — E669 Obesity, unspecified: Secondary | ICD-10-CM | POA: Diagnosis present

## 2020-12-20 DIAGNOSIS — R2689 Other abnormalities of gait and mobility: Secondary | ICD-10-CM | POA: Diagnosis not present

## 2020-12-20 DIAGNOSIS — Y92410 Unspecified street and highway as the place of occurrence of the external cause: Secondary | ICD-10-CM | POA: Diagnosis not present

## 2020-12-20 DIAGNOSIS — K219 Gastro-esophageal reflux disease without esophagitis: Secondary | ICD-10-CM | POA: Diagnosis present

## 2020-12-20 DIAGNOSIS — S22018S Other fracture of first thoracic vertebra, sequela: Secondary | ICD-10-CM | POA: Diagnosis not present

## 2020-12-20 DIAGNOSIS — Z20822 Contact with and (suspected) exposure to covid-19: Secondary | ICD-10-CM | POA: Diagnosis present

## 2020-12-20 DIAGNOSIS — I679 Cerebrovascular disease, unspecified: Secondary | ICD-10-CM | POA: Diagnosis not present

## 2020-12-20 DIAGNOSIS — I6340 Cerebral infarction due to embolism of unspecified cerebral artery: Secondary | ICD-10-CM | POA: Diagnosis not present

## 2020-12-20 DIAGNOSIS — E1122 Type 2 diabetes mellitus with diabetic chronic kidney disease: Secondary | ICD-10-CM | POA: Diagnosis present

## 2020-12-20 DIAGNOSIS — I671 Cerebral aneurysm, nonruptured: Secondary | ICD-10-CM | POA: Diagnosis present

## 2020-12-20 DIAGNOSIS — S2221XA Fracture of manubrium, initial encounter for closed fracture: Secondary | ICD-10-CM | POA: Diagnosis present

## 2020-12-20 DIAGNOSIS — S298XXA Other specified injuries of thorax, initial encounter: Secondary | ICD-10-CM | POA: Diagnosis not present

## 2020-12-20 DIAGNOSIS — Z87891 Personal history of nicotine dependence: Secondary | ICD-10-CM | POA: Diagnosis not present

## 2020-12-20 DIAGNOSIS — F32A Depression, unspecified: Secondary | ICD-10-CM | POA: Diagnosis present

## 2020-12-20 DIAGNOSIS — I10 Essential (primary) hypertension: Secondary | ICD-10-CM | POA: Diagnosis not present

## 2020-12-20 DIAGNOSIS — I69322 Dysarthria following cerebral infarction: Secondary | ICD-10-CM | POA: Diagnosis not present

## 2020-12-20 DIAGNOSIS — Z7902 Long term (current) use of antithrombotics/antiplatelets: Secondary | ICD-10-CM | POA: Diagnosis not present

## 2020-12-20 DIAGNOSIS — E1151 Type 2 diabetes mellitus with diabetic peripheral angiopathy without gangrene: Secondary | ICD-10-CM | POA: Diagnosis present

## 2020-12-20 DIAGNOSIS — T148XXA Other injury of unspecified body region, initial encounter: Secondary | ICD-10-CM | POA: Diagnosis present

## 2020-12-20 DIAGNOSIS — N1832 Chronic kidney disease, stage 3b: Secondary | ICD-10-CM | POA: Diagnosis present

## 2020-12-20 DIAGNOSIS — S8012XA Contusion of left lower leg, initial encounter: Secondary | ICD-10-CM | POA: Diagnosis present

## 2020-12-20 DIAGNOSIS — Z79899 Other long term (current) drug therapy: Secondary | ICD-10-CM | POA: Diagnosis not present

## 2020-12-20 DIAGNOSIS — Z7982 Long term (current) use of aspirin: Secondary | ICD-10-CM | POA: Diagnosis not present

## 2020-12-20 DIAGNOSIS — M660 Rupture of popliteal cyst: Secondary | ICD-10-CM | POA: Diagnosis present

## 2020-12-20 DIAGNOSIS — R29701 NIHSS score 1: Secondary | ICD-10-CM | POA: Diagnosis present

## 2020-12-20 DIAGNOSIS — S2232XA Fracture of one rib, left side, initial encounter for closed fracture: Secondary | ICD-10-CM | POA: Diagnosis present

## 2020-12-20 LAB — ECHOCARDIOGRAM COMPLETE BUBBLE STUDY
AR max vel: 0.89 cm2
AV Area VTI: 1.08 cm2
AV Area mean vel: 0.93 cm2
AV Mean grad: 10 mmHg
AV Peak grad: 20.8 mmHg
Ao pk vel: 2.28 m/s
Area-P 1/2: 2.91 cm2
S' Lateral: 4.6 cm

## 2020-12-20 LAB — HEMOGLOBIN AND HEMATOCRIT, BLOOD
HCT: 32.2 % — ABNORMAL LOW (ref 36.0–46.0)
Hemoglobin: 10 g/dL — ABNORMAL LOW (ref 12.0–15.0)

## 2020-12-20 LAB — CREATININE, SERUM
Creatinine, Ser: 1.67 mg/dL — ABNORMAL HIGH (ref 0.44–1.00)
GFR, Estimated: 32 mL/min — ABNORMAL LOW (ref >60)

## 2020-12-20 MED ORDER — OXYCODONE HCL 5 MG PO TABS
5.0000 mg | ORAL_TABLET | ORAL | Status: DC | PRN
Start: 2020-12-20 — End: 2020-12-22
  Administered 2020-12-21 – 2020-12-22 (×3): 5 mg via ORAL
  Filled 2020-12-20 (×3): qty 1

## 2020-12-20 MED ORDER — ENOXAPARIN SODIUM 30 MG/0.3ML IJ SOSY
30.0000 mg | PREFILLED_SYRINGE | INTRAMUSCULAR | Status: DC
  Administered 2020-12-20 – 2020-12-22 (×3): 30 mg via SUBCUTANEOUS
  Filled 2020-12-20 (×3): qty 0.3
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
## 2020-12-20 NOTE — Progress Notes (Signed)
VASCULAR LAB    Bilateral lower extremity venous duplex has been performed.  See CV proc for preliminary results.   Jamaurion Slemmer, RVT 12/20/2020, 4:43 PM

## 2020-12-20 NOTE — Progress Notes (Signed)
FPTS Interim Night Progress Note  S:Patient sleeping comfortably.  Rounded with primary night RN.  No concerns voiced.  No orders required.    O: Today's Vitals   12/19/20 2000 12/19/20 2104 12/20/20 0033 12/20/20 0427  BP:  (!) 158/67 (!) 148/61 (!) 162/57  Pulse:  66 63 62  Resp:  '15 20 16  '$ Temp:  98.1 F (36.7 C) 97.7 F (36.5 C) 98.1 F (36.7 C)  TempSrc:  Oral Oral Oral  SpO2:  93% 90% 96%  Weight:   73.9 kg   Height:   '5\' 5"'$  (1.651 m)   PainSc: 3          A/P: Continue current management  Carollee Leitz MD PGY-3, [place] Medicine Service pager [phone]

## 2020-12-20 NOTE — Progress Notes (Signed)
FPTS Brief Progress Note  S:Pt lying comfortably in bed looking at phone.  States her pain is well controlled and has no complaints  O: BP 117/89 (BP Location: Left Arm)   Pulse (!) 59   Temp 97.9 F (36.6 C) (Oral)   Resp 18   Ht '5\' 5"'$  (1.651 m)   Wt 73.9 kg   SpO2 97%   BMI 27.11 kg/m   General: lying supine in bed, NAD Respiratory: breathing comfortably on RA Neuro: alert and oriented   A/P:  Continue to follow plan outline by day teams progress note  - Orders reviewed. Labs for AM ordered, which was adjusted as needed.    Precious Gilding, DO 12/20/2020, 11:35 PM PGY-1, [facility] Night Resident  Please page [phone] with questions.
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
## 2020-12-20 NOTE — Plan of Care (Signed)
  Problem: Education: Goal: Knowledge of General Education information will improve Description: Including pain rating scale, medication(s)/side effects and non-pharmacologic comfort measures Outcome: Progressing   Problem: Activity: Goal: Risk for activity intolerance will decrease Outcome: Progressing   Problem: Nutrition: Goal: Adequate nutrition will be maintained Outcome: Progressing   Problem: Coping: Goal: Level of anxiety will decrease Outcome: Progressing   
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
## 2020-12-20 NOTE — Plan of Care (Signed)

## 2020-12-21 ENCOUNTER — Ambulatory Visit: Payer: PPO | Admitting: Pharmacist

## 2020-12-21 DIAGNOSIS — T148XXA Other injury of unspecified body region, initial encounter: Secondary | ICD-10-CM

## 2020-12-21 DIAGNOSIS — S2221XA Fracture of manubrium, initial encounter for closed fracture: Secondary | ICD-10-CM

## 2020-12-21 LAB — CBC
HCT: 32.8 % — ABNORMAL LOW (ref 36.0–46.0)
Hemoglobin: 10.3 g/dL — ABNORMAL LOW (ref 12.0–15.0)
MCH: 26.2 pg (ref 26.0–34.0)
MCHC: 31.4 g/dL (ref 30.0–36.0)
MCV: 83.5 fL (ref 80.0–100.0)
Platelets: 236 10*3/uL (ref 150–400)
RBC: 3.93 MIL/uL (ref 3.87–5.11)
RDW: 14.4 % (ref 11.5–15.5)
WBC: 7 10*3/uL (ref 4.0–10.5)
nRBC: 0 % (ref 0.0–0.2)

## 2020-12-21 LAB — BASIC METABOLIC PANEL
Anion gap: 7 (ref 5–15)
BUN: 23 mg/dL (ref 8–23)
CO2: 25 mmol/L (ref 22–32)
Calcium: 9.1 mg/dL (ref 8.9–10.3)
Chloride: 102 mmol/L (ref 98–111)
Creatinine, Ser: 1.49 mg/dL — ABNORMAL HIGH (ref 0.44–1.00)
GFR, Estimated: 36 mL/min — ABNORMAL LOW (ref >60)
Glucose, Bld: 160 mg/dL — ABNORMAL HIGH (ref 70–99)
Potassium: 4.8 mmol/L (ref 3.5–5.1)
Sodium: 134 mmol/L — ABNORMAL LOW (ref 135–145)

## 2020-12-21 MED ORDER — CARVEDILOL 6.25 MG PO TABS
6.2500 mg | ORAL_TABLET | Freq: Two times a day (BID) | ORAL | Status: DC
  Administered 2020-12-21 – 2020-12-25 (×7): 6.25 mg via ORAL
  Filled 2020-12-21 (×7): qty 1

## 2020-12-21 MED ORDER — INDAPAMIDE 1.25 MG PO TABS
1.2500 mg | ORAL_TABLET | Freq: Every day | ORAL | Status: DC
  Administered 2020-12-21 – 2020-12-23 (×3): 1.25 mg via ORAL
  Filled 2020-12-21 (×3): qty 1

## 2020-12-21 MED ORDER — AMLODIPINE BESYLATE 10 MG PO TABS
10.0000 mg | ORAL_TABLET | Freq: Every day | ORAL | Status: DC
  Administered 2020-12-21 – 2020-12-25 (×5): 10 mg via ORAL
  Filled 2020-12-21 (×5): qty 1

## 2020-12-21 NOTE — Progress Notes (Signed)
Patient was in the bathroom when I rounded on her.  It sounded like she was in the shower.  No events overnight, no concerns from the nurse.
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
## 2020-12-21 NOTE — Plan of Care (Signed)
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
## 2020-12-21 NOTE — NC FL2 (Signed)
[place] MEDICAID FL2 LEVEL OF CARE SCREENING TOOL     IDENTIFICATION  Patient Name: Lauren Craig Birthdate: Mar 15, 1946 Sex: female Admission Date (Current Location): 12/17/2020  [facility] and [place] Number:  Herbalist and Address:  [facility], [place] [address], [place], [place] [address]      Provider Number: [id number]  Attending Physician Name and Address:  Leeanne Rio, MD  Relative Name and Phone Number:       Current Level of Care:   Recommended Level of Care: Skilled Nursing Facility Prior Approval Number:    Date Approved/Denied:   PASRR Number: [id number]  Discharge Plan: SNF    Current Diagnoses: Patient Active Problem List   Diagnosis Date Noted   Cerebral embolism with cerebral infarction 12/19/2020   Fracture 12/18/2020   Renal cyst 12/17/2020   Anemia 11/19/2020   Peripheral edema 11/19/2020   Otalgia, bilateral 11/09/2020   Visual field defect 11/08/2020   Cerebrovascular disease 11/08/2020   Erosive esophagitis    Duodenitis    HTN (hypertension) 10/03/2017   Hyperlipidemia 10/03/2017   Controlled diabetes mellitus type 2 with complications ([place]) [id number]    Orientation RESPIRATION BLADDER Height & Weight     Time, Self, Situation, Place  Normal Continent Weight: 162 lb 14.7 oz (73.9 kg) Height:  '5\' 5"'$  (165.1 cm)  BEHAVIORAL SYMPTOMS/MOOD NEUROLOGICAL BOWEL NUTRITION STATUS      Continent Diet  AMBULATORY STATUS COMMUNICATION OF NEEDS Skin   Limited Assist Verbally Normal                       Personal Care Assistance Level of Assistance  Feeding, Dressing, Bathing Bathing Assistance: Limited assistance   Dressing Assistance: Limited assistance     Functional Limitations Info  Sight, Speech, Hearing Sight Info: Adequate Hearing Info: Adequate Speech Info: Adequate    SPECIAL CARE FACTORS FREQUENCY  PT (By licensed PT), OT (By licensed OT)     PT Frequency: 5x a week OT  Frequency: 5x a week            Contractures Contractures Info: Not present    Additional Factors Info  Code Status, Allergies Code Status Info: Parital Allergies Info: Penicillins   Insulin Lispro   Insulin Lispro Prot & Lispro   Semaglutide   Metformin           Current Medications (12/21/2020):  This is the current hospital active medication list Current Facility-Administered Medications  Medication Dose Route Frequency Provider Last Rate Last Admin   acetaminophen (TYLENOL) tablet 650 mg  650 mg Oral Q6H Patel, Poonam, MD   650 mg at 12/21/20 1055   albuterol (PROVENTIL) (2.5 MG/3ML) 0.083% nebulizer solution 2.5 mg  2.5 mg Inhalation Q6H PRN Ezequiel Essex, MD       amLODipine (NORVASC) tablet 10 mg  10 mg Oral Daily Ezequiel Essex, MD   10 mg at 12/21/20 1226   aspirin EC tablet 81 mg  81 mg Oral Daily Lurline Del, DO   81 mg at 12/21/20 1055   atorvastatin (LIPITOR) tablet 80 mg  80 mg Oral QHS Rosalin Hawking, MD   80 mg at 12/20/20 2100   carvedilol (COREG) tablet 6.25 mg  6.25 mg Oral BID WC Ezequiel Essex, MD       clopidogrel (PLAVIX) tablet 75 mg  75 mg Oral Daily Welborn, Ryan, DO   75 mg at 12/21/20 1055   enoxaparin (LOVENOX) injection 30 mg  30 mg Subcutaneous Q24H Darlina Sicilian, [place]   30 mg at 12/20/20 2042   famotidine (PEPCID) tablet 10 mg  10 mg Oral Daily Ezequiel Essex, MD   10 mg at 12/21/20 1055   indapamide (LOZOL) tablet 1.25 mg  1.25 mg Oral Daily Ezequiel Essex, MD   1.25 mg at 12/21/20 1226   midazolam (VERSED) injection 1 mg  1 mg Intravenous Once PRN Luna Fuse, MD       oxyCODONE (Oxy IR/ROXICODONE) immediate release tablet 5 mg  5 mg Oral Q4H PRN Lattie Haw, MD       sertraline (ZOLOFT) tablet 25 mg  25 mg Oral Daily Ezequiel Essex, MD   25 mg at 12/21/20 1055   sucralfate (CARAFATE) tablet 1 g  1 g Oral QAC supper Ezequiel Essex, MD   1 g at 12/20/20 1731     Discharge Medications: Please see discharge summary for a list of  discharge medications.  Relevant Imaging Results:  Relevant Lab Results:   Additional Information SSN: [id number], Covid Vaccinated with booster  Emeterio Reeve, LCSW
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
## 2020-12-22 MED ORDER — OXYCODONE HCL 5 MG PO TABS
2.5000 mg | ORAL_TABLET | ORAL | Status: DC | PRN
  Administered 2020-12-23 – 2020-12-24 (×3): 2.5 mg via ORAL
  Filled 2020-12-22 (×4): qty 1

## 2020-12-22 NOTE — Plan of Care (Signed)
  Problem: Clinical Measurements: Goal: Ability to maintain clinical measurements within normal limits will improve Outcome: Progressing Goal: Will remain free from infection Outcome: Progressing Goal: Diagnostic test results will improve Outcome: Progressing Goal: Respiratory complications will improve Outcome: Progressing Goal: Cardiovascular complication will be avoided Outcome: Progressing   Problem: Health Behavior/Discharge Planning: Goal: Ability to manage health-related needs will improve Outcome: Progressing   Problem: Education: Goal: Knowledge of General Education information will improve Description: Including pain rating scale, medication(s)/side effects and non-pharmacologic comfort measures Outcome: Progressing   Problem: Pain Managment: Goal: General experience of comfort will improve Outcome: Progressing   Problem: Safety: Goal: Ability to remain free from injury will improve Outcome: Progressing   Problem: Skin Integrity: Goal: Risk for impaired skin integrity will decrease Outcome: Progressing

## 2020-12-22 NOTE — Progress Notes (Addendum)
Inpatient Rehab Admissions Coordinator Note:   Per therapy recommendations, pt was screened for CIR candidacy by Clemens Catholic, [place] CCC-SLP. At this time, Pt. Appears to have functional decline and is a potential  candidate for CIR. Note that pt. Is already relatively high level and insurance may not approve CIR. Will request order for rehab consult per protocol.  Please contact me with questions.   Clemens Catholic, [place], [place] Admissions Coordinator  [phone] ([place]) [phone] (office)
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
## 2020-12-22 NOTE — Progress Notes (Signed)
FPTS Brief Progress Note  S Saw patient at bedside this evening. Patient reports that she has some back pain. She also wants to be considered for CIR. I will pass on this message to the day team.   O: BP (!) 132/59 (BP Location: Left Arm)   Pulse (!) 57   Temp 97.8 F (36.6 C) (Oral)   Resp 18   Ht '5\' 5"'$  (1.651 m)   Wt 73.9 kg   SpO2 100%   BMI 27.11 kg/m    General: Alert, no acute distress, comfortable  A/P: Plan per day team  - Orders reviewed. Labs for AM ordered, which was adjusted as needed.   Lattie Haw, MD 12/22/2020, 4:29 AM PGY-3, [facility] Family [facility] Night Resident  Please page [phone] with questions.

## 2020-12-23 LAB — BASIC METABOLIC PANEL
Anion gap: 7 (ref 5–15)
BUN: 35 mg/dL — ABNORMAL HIGH (ref 8–23)
CO2: 21 mmol/L — ABNORMAL LOW (ref 22–32)
Calcium: 8.8 mg/dL — ABNORMAL LOW (ref 8.9–10.3)
Chloride: 106 mmol/L (ref 98–111)
Creatinine, Ser: 1.37 mg/dL — ABNORMAL HIGH (ref 0.44–1.00)
GFR, Estimated: 40 mL/min — ABNORMAL LOW (ref >60)
Glucose, Bld: 159 mg/dL — ABNORMAL HIGH (ref 70–99)
Potassium: 4.3 mmol/L (ref 3.5–5.1)
Sodium: 134 mmol/L — ABNORMAL LOW (ref 135–145)

## 2020-12-23 LAB — CBC
HCT: 30.8 % — ABNORMAL LOW (ref 36.0–46.0)
Hemoglobin: 9.6 g/dL — ABNORMAL LOW (ref 12.0–15.0)
MCH: 26.2 pg (ref 26.0–34.0)
MCHC: 31.2 g/dL (ref 30.0–36.0)
MCV: 83.9 fL (ref 80.0–100.0)
Platelets: 261 10*3/uL (ref 150–400)
RBC: 3.67 MIL/uL — ABNORMAL LOW (ref 3.87–5.11)
RDW: 14.5 % (ref 11.5–15.5)
WBC: 7.6 10*3/uL (ref 4.0–10.5)
nRBC: 0 % (ref 0.0–0.2)

## 2020-12-23 MED ORDER — LIDOCAINE 5 % EX PTCH
1.0000 | MEDICATED_PATCH | Freq: Two times a day (BID) | CUTANEOUS | Status: DC | PRN
  Administered 2020-12-25: 1 via TRANSDERMAL
  Filled 2020-12-23 (×2): qty 1

## 2020-12-23 MED ORDER — ENOXAPARIN SODIUM 40 MG/0.4ML IJ SOSY
40.0000 mg | PREFILLED_SYRINGE | INTRAMUSCULAR | Status: DC
  Administered 2020-12-23 – 2020-12-24 (×2): 40 mg via SUBCUTANEOUS
  Filled 2020-12-23 (×2): qty 0.4

## 2020-12-23 MED ORDER — INDAPAMIDE 2.5 MG PO TABS
2.5000 mg | ORAL_TABLET | Freq: Every day | ORAL | Status: DC
  Administered 2020-12-24 – 2020-12-25 (×2): 2.5 mg via ORAL
  Filled 2020-12-23 (×2): qty 1
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
## 2020-12-23 NOTE — Progress Notes (Signed)
Inpatient Rehab Admissions Coordinator:   Consult received.  I met with patient at the bedside to discuss CIR goals/expectations.  I explained average length of stay to be about 2 weeks, but dependent on progress I felt she would likely only need a week or less.  Goals would be supervision to mod I.  She states her daughter lives with her, but is actually being discharged from [place] today; her grand daughter can come stay with her after a CIR stay if needed.  I will call to confirm this today.  Pt is mobilizing fairly well with PT (original OT recommendations are for SNF.  SLP has not evaluated pt, but given acute CVAs with clear dysarthria would certainly benefit from a consult.  I explained to her that I would need to seek insurance prior auth from [facility], which I can start today.  I will continue to follow for possible admission pending authorization and bed availability.    Shann Medal, PT, DPT Admissions Coordinator [phone] 12/23/20  12:51 PM
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
## 2020-12-23 NOTE — Progress Notes (Signed)
FPTS Brief Progress Note  S: Pt was awake.  Her only complaint was right shoulder pain that she rated at a 10/10.     O: BP (!) 161/68 (BP Location: Left Arm)   Pulse 61   Temp 98.6 F (37 C) (Oral)   Resp 17   Ht '5\' 5"'$  (1.651 m)   Wt 73.9 kg   SpO2 98%   BMI 27.11 kg/m   General: lying in bed, NAD Respiratory: breathing comfortably on RA  A/P: Pain control Patient rated her shoulder pain at a 10/10.  She had received 650 mg acetaminophen and 2.5 mg oxycodone about an hour prior to exam but stated it did not seem to help with the shoulder pain.  A lidocaine patch was ordered. -Oxycodone 2.5 mg every 4 hours as needed -Acetaminophen 600 mg every 6 hours as needed -Lidocaine patch as needed Continue to follow plan outlined in day teams progress note - Orders reviewed. Labs for AM not ordered, which was adjusted as needed.    Precious Gilding, DO 12/23/2020, 11:45 PM PGY-1, [facility] Night Resident  Please page [phone] with questions.

## 2020-12-24 DIAGNOSIS — S298XXA Other specified injuries of thorax, initial encounter: Secondary | ICD-10-CM

## 2020-12-24 NOTE — TOC Progression Note (Signed)
Transition of Care [facility]) - Progression Note    Patient Details  Name: Lauren Craig MRN: [id number] Date of Birth: Dec 15, 1945  Transition of Care [facility]) CM/SW [place], RN Phone Number: 12/24/2020, 4:06 PM  Clinical Narrative:    Patient lives with daughter who is just getting out of CIR, patient was recommended to CIR< CIR saw her and are performing authorization for admission. She states that her grandaughter could come stay with her and her daughter post CIR.   Expected Discharge Plan: [place] Barriers to Discharge: Continued Medical Work up, Ship broker  Expected Discharge Plan and Services Expected Discharge Plan: [place]       Living arrangements for the past 2 months: [place]                                       Social Determinants of Health (SDOH) Interventions    Readmission Risk Interventions No flowsheet data found.

## 2020-12-24 NOTE — Progress Notes (Signed)
PT Cancellation Note  Patient Details Name: Lauren Craig MRN: [id number] DOB: February 14, 1946   Cancelled Treatment:    Reason Eval/Treat Not Completed: Patient declined, no reason specified.  Pt is tired from the day, declines therapy and will re-try tomorrow.   Ramond Dial 12/24/2020, 3:29 PM  Mee Hives, PT MS Acute Rehab Dept. Number: [place]
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
## 2020-12-24 NOTE — Care Management Important Message (Signed)
Important Message  Patient Details  Name: Lauren Craig MRN: [id number] Date of Birth: [birth year]   Medicare Important Message Given:  Yes     Orbie Pyo 12/24/2020, 2:15 PM

## 2020-12-25 ENCOUNTER — Encounter (HOSPITAL_COMMUNITY): Payer: Self-pay | Admitting: Physical Medicine & Rehabilitation

## 2020-12-25 ENCOUNTER — Other Ambulatory Visit: Payer: Self-pay

## 2020-12-25 ENCOUNTER — Inpatient Hospital Stay (HOSPITAL_COMMUNITY)
Admission: RE | Admit: 2020-12-25 | Discharge: 2021-01-05 | DRG: 092 | Disposition: A | Discharge status: Home / Self Care | Payer: PPO | Source: Intra-hospital | Attending: Physical Medicine & Rehabilitation | Admitting: Physical Medicine & Rehabilitation

## 2020-12-25 DIAGNOSIS — I679 Cerebrovascular disease, unspecified: Secondary | ICD-10-CM

## 2020-12-25 DIAGNOSIS — Z7902 Long term (current) use of antithrombotics/antiplatelets: Secondary | ICD-10-CM | POA: Diagnosis not present

## 2020-12-25 DIAGNOSIS — Z79899 Other long term (current) drug therapy: Secondary | ICD-10-CM

## 2020-12-25 DIAGNOSIS — R2689 Other abnormalities of gait and mobility: Secondary | ICD-10-CM | POA: Diagnosis not present

## 2020-12-25 DIAGNOSIS — I639 Cerebral infarction, unspecified: Secondary | ICD-10-CM | POA: Diagnosis not present

## 2020-12-25 DIAGNOSIS — I10 Essential (primary) hypertension: Secondary | ICD-10-CM

## 2020-12-25 DIAGNOSIS — Z90710 Acquired absence of both cervix and uterus: Secondary | ICD-10-CM

## 2020-12-25 DIAGNOSIS — K2210 Ulcer of esophagus without bleeding: Secondary | ICD-10-CM | POA: Diagnosis not present

## 2020-12-25 DIAGNOSIS — Z8673 Personal history of transient ischemic attack (TIA), and cerebral infarction without residual deficits: Secondary | ICD-10-CM

## 2020-12-25 DIAGNOSIS — K219 Gastro-esophageal reflux disease without esophagitis: Secondary | ICD-10-CM | POA: Diagnosis not present

## 2020-12-25 DIAGNOSIS — E1151 Type 2 diabetes mellitus with diabetic peripheral angiopathy without gangrene: Secondary | ICD-10-CM | POA: Diagnosis not present

## 2020-12-25 DIAGNOSIS — E1136 Type 2 diabetes mellitus with diabetic cataract: Secondary | ICD-10-CM | POA: Diagnosis present

## 2020-12-25 DIAGNOSIS — L97901 Non-pressure chronic ulcer of unspecified part of unspecified lower leg limited to breakdown of skin: Secondary | ICD-10-CM

## 2020-12-25 DIAGNOSIS — N183 Chronic kidney disease, stage 3 unspecified: Secondary | ICD-10-CM | POA: Diagnosis present

## 2020-12-25 DIAGNOSIS — I129 Hypertensive chronic kidney disease with stage 1 through stage 4 chronic kidney disease, or unspecified chronic kidney disease: Secondary | ICD-10-CM | POA: Diagnosis not present

## 2020-12-25 DIAGNOSIS — M25511 Pain in right shoulder: Secondary | ICD-10-CM | POA: Diagnosis not present

## 2020-12-25 DIAGNOSIS — E1122 Type 2 diabetes mellitus with diabetic chronic kidney disease: Secondary | ICD-10-CM | POA: Diagnosis not present

## 2020-12-25 DIAGNOSIS — S22018G Other fracture of first thoracic vertebra, subsequent encounter for fracture with delayed healing: Secondary | ICD-10-CM

## 2020-12-25 DIAGNOSIS — Z87891 Personal history of nicotine dependence: Secondary | ICD-10-CM

## 2020-12-25 DIAGNOSIS — E782 Mixed hyperlipidemia: Secondary | ICD-10-CM | POA: Diagnosis present

## 2020-12-25 DIAGNOSIS — Z7982 Long term (current) use of aspirin: Secondary | ICD-10-CM

## 2020-12-25 DIAGNOSIS — L03116 Cellulitis of left lower limb: Secondary | ICD-10-CM | POA: Diagnosis not present

## 2020-12-25 DIAGNOSIS — R112 Nausea with vomiting, unspecified: Secondary | ICD-10-CM

## 2020-12-25 DIAGNOSIS — S2221XD Fracture of manubrium, subsequent encounter for fracture with routine healing: Secondary | ICD-10-CM | POA: Diagnosis not present

## 2020-12-25 DIAGNOSIS — S22018S Other fracture of first thoracic vertebra, sequela: Secondary | ICD-10-CM

## 2020-12-25 LAB — CBC
HCT: 31.7 % — ABNORMAL LOW (ref 36.0–46.0)
Hemoglobin: 9.9 g/dL — ABNORMAL LOW (ref 12.0–15.0)
MCH: 26.1 pg (ref 26.0–34.0)
MCHC: 31.2 g/dL (ref 30.0–36.0)
MCV: 83.6 fL (ref 80.0–100.0)
Platelets: 304 10*3/uL (ref 150–400)
RBC: 3.79 MIL/uL — ABNORMAL LOW (ref 3.87–5.11)
RDW: 14.5 % (ref 11.5–15.5)
WBC: 8.4 10*3/uL (ref 4.0–10.5)
nRBC: 0 % (ref 0.0–0.2)

## 2020-12-25 LAB — CREATININE, SERUM
Creatinine, Ser: 1.24 mg/dL — ABNORMAL HIGH (ref 0.44–1.00)
GFR, Estimated: 45 mL/min — ABNORMAL LOW (ref >60)

## 2020-12-25 MED ORDER — ATORVASTATIN CALCIUM 80 MG PO TABS
80.0000 mg | ORAL_TABLET | Freq: Every day | ORAL | Status: DC
  Administered 2020-12-25 – 2021-01-04 (×11): 80 mg via ORAL
  Filled 2020-12-25 (×11): qty 1

## 2020-12-25 MED ORDER — ASPIRIN EC 81 MG PO TBEC
81.0000 mg | DELAYED_RELEASE_TABLET | Freq: Every day | ORAL | Status: DC
  Administered 2020-12-26 – 2021-01-05 (×11): 81 mg via ORAL
  Filled 2020-12-25 (×11): qty 1

## 2020-12-25 MED ORDER — ATORVASTATIN CALCIUM 80 MG PO TABS: 80.0000 mg | ORAL_TABLET | Freq: Every day | ORAL | Status: DC

## 2020-12-25 MED ORDER — INDAPAMIDE 2.5 MG PO TABS: 2.5000 mg | ORAL_TABLET | Freq: Every day | ORAL | Status: DC

## 2020-12-25 MED ORDER — ALBUTEROL SULFATE (2.5 MG/3ML) 0.083% IN NEBU: 2.5000 mg | INHALATION_SOLUTION | Freq: Four times a day (QID) | RESPIRATORY_TRACT | Status: DC | PRN

## 2020-12-25 MED ORDER — ASPIRIN 81 MG PO TBEC: 81.0000 mg | DELAYED_RELEASE_TABLET | Freq: Every day | ORAL | 0 refills | Status: AC

## 2020-12-25 MED ORDER — SERTRALINE HCL 25 MG PO TABS: 25.0000 mg | ORAL_TABLET | Freq: Every day | ORAL | Status: DC

## 2020-12-25 MED ORDER — CLOPIDOGREL BISULFATE 75 MG PO TABS
75.0000 mg | ORAL_TABLET | Freq: Every day | ORAL | Status: DC
  Administered 2020-12-26 – 2021-01-05 (×11): 75 mg via ORAL
  Filled 2020-12-25 (×11): qty 1

## 2020-12-25 MED ORDER — INDAPAMIDE 2.5 MG PO TABS
2.5000 mg | ORAL_TABLET | Freq: Every day | ORAL | Status: DC
  Administered 2020-12-26 – 2020-12-31 (×6): 2.5 mg via ORAL
  Filled 2020-12-25 (×6): qty 1

## 2020-12-25 MED ORDER — LIVING WELL WITH DIABETES BOOK
Freq: Once | Status: AC
  Filled 2020-12-25: qty 1

## 2020-12-25 MED ORDER — CLOPIDOGREL BISULFATE 75 MG PO TABS: 75.0000 mg | ORAL_TABLET | Freq: Every day | ORAL | Status: DC

## 2020-12-25 MED ORDER — SUCRALFATE 1 G PO TABS
1.0000 g | ORAL_TABLET | Freq: Every day | ORAL | Status: DC
  Administered 2020-12-25 – 2021-01-04 (×11): 1 g via ORAL
  Filled 2020-12-25 (×12): qty 1

## 2020-12-25 MED ORDER — SERTRALINE HCL 50 MG PO TABS
25.0000 mg | ORAL_TABLET | Freq: Every day | ORAL | Status: DC
  Administered 2020-12-26 – 2021-01-05 (×11): 25 mg via ORAL
  Filled 2020-12-25 (×11): qty 1

## 2020-12-25 MED ORDER — LIDOCAINE 5 % EX PTCH: 1.0000 | MEDICATED_PATCH | Freq: Two times a day (BID) | CUTANEOUS | 0 refills | Status: DC | PRN

## 2020-12-25 MED ORDER — CARVEDILOL 6.25 MG PO TABS
6.2500 mg | ORAL_TABLET | Freq: Two times a day (BID) | ORAL | Status: DC
  Administered 2020-12-25 – 2021-01-05 (×20): 6.25 mg via ORAL
  Filled 2020-12-25 (×22): qty 1

## 2020-12-25 MED ORDER — ENOXAPARIN SODIUM 40 MG/0.4ML IJ SOSY
40.0000 mg | PREFILLED_SYRINGE | INTRAMUSCULAR | Status: DC
  Administered 2020-12-25 – 2021-01-04 (×11): 40 mg via SUBCUTANEOUS
  Filled 2020-12-25 (×11): qty 0.4

## 2020-12-25 MED ORDER — BLOOD PRESSURE CONTROL BOOK
Freq: Once | Status: AC
  Filled 2020-12-25: qty 1

## 2020-12-25 MED ORDER — OXYCODONE HCL 5 MG PO TABS
2.5000 mg | ORAL_TABLET | ORAL | Status: DC | PRN
  Administered 2020-12-25 – 2021-01-05 (×23): 2.5 mg via ORAL
  Filled 2020-12-25 (×24): qty 1

## 2020-12-25 MED ORDER — FAMOTIDINE 20 MG PO TABS
10.0000 mg | ORAL_TABLET | Freq: Every day | ORAL | Status: DC
  Administered 2020-12-26 – 2021-01-05 (×11): 10 mg via ORAL
  Filled 2020-12-25 (×11): qty 1

## 2020-12-25 MED ORDER — ENOXAPARIN SODIUM 40 MG/0.4ML IJ SOSY: 40.0000 mg | PREFILLED_SYRINGE | INTRAMUSCULAR | Status: DC

## 2020-12-25 MED ORDER — AMLODIPINE BESYLATE 10 MG PO TABS
10.0000 mg | ORAL_TABLET | Freq: Every day | ORAL | Status: DC
  Administered 2020-12-26 – 2021-01-05 (×11): 10 mg via ORAL
  Filled 2020-12-25 (×11): qty 1

## 2020-12-25 MED ORDER — LIDOCAINE 5 % EX PTCH
1.0000 | MEDICATED_PATCH | Freq: Two times a day (BID) | CUTANEOUS | Status: DC | PRN
  Administered 2020-12-26 – 2021-01-05 (×3): 1 via TRANSDERMAL
  Filled 2020-12-25 (×3): qty 1
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
## 2020-12-25 NOTE — Progress Notes (Signed)
FPTS Brief Progress Note  S: Spoke with nurse who stated pt is sleeping soundly. No concerns at this time.   O: BP (!) 167/57 (BP Location: Right Arm)   Pulse 61   Temp 98 F (36.7 C) (Oral)   Resp 16   Ht '5\' 5"'$  (1.651 m)   Wt 73.9 kg   SpO2 99%   BMI 27.11 kg/m     A/P: Continue to follow plan outlined in day teams progress note - Orders reviewed. Labs for AM not ordered, which was adjusted as needed.    Precious Gilding, DO 12/25/2020, 4:15 AM PGY-1, [facility] Night Resident  Please page [phone] with questions.
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
## 2020-12-25 NOTE — Progress Notes (Signed)
Inpatient Rehabilitation Medication Review by a Pharmacist  A complete drug regimen review was completed for this patient to identify any potential clinically significant medication issues.  Clinically significant medication issues were identified:  yes   Type of Medication Issue Identified Description of Issue Urgent (address now) Non-Urgent (address on AM team rounds) Plan Plan Accepted by Provider? (Yes / No / Pending AM Rounds)  Drug Interaction(s) (clinically significant)       Duplicate Therapy       Allergy       No Medication Administration End Date       Incorrect Dose       Additional Drug Therapy Needed  Protonix BID Non-urgent Restart PPI pending  Other         For non-urgent medication issues to be resolved on team rounds tomorrow morning a [facility] Secure Chat Handoff was sent to: Lauraine Rinne, PA-C   Pharmacist comments: Patient has erosive esophagitis and was on an anti-reflux regimen per outpatient GI that includes BID PPI. Please restart Protonix 40 mg BID  Time spent performing this drug regimen review (minutes):  25   Thank you for allowing [place] to participate in this patients care. Jens Som, PharmD 12/25/2020 5:48 PM
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
## 2020-12-26 MED ORDER — ACETAMINOPHEN 325 MG PO TABS
650.0000 mg | ORAL_TABLET | Freq: Three times a day (TID) | ORAL | Status: DC
  Administered 2020-12-26 – 2021-01-05 (×31): 650 mg via ORAL
  Filled 2020-12-26 (×31): qty 2
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
## 2020-12-27 MED ORDER — PANTOPRAZOLE SODIUM 40 MG PO TBEC
40.0000 mg | DELAYED_RELEASE_TABLET | Freq: Two times a day (BID) | ORAL | Status: DC
  Administered 2020-12-27 – 2021-01-05 (×18): 40 mg via ORAL
  Filled 2020-12-27 (×21): qty 1
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
## 2020-12-27 NOTE — Discharge Instructions (Addendum)
Inpatient Rehab Discharge Instructions  Lauren Craig Discharge date and time: No discharge date for patient encounter.   Activities/Precautions/ Functional Status: Activity: As tolerated Diet: Diabetic diet Wound Care: Routine skin checks Functional status:  ___ No restrictions     ___ Walk up steps independently ___ 24/7 supervision/assistance   ___ Walk up steps with assistance ___ Intermittent supervision/assistance  ___ Bathe/dress independently ___ Walk with walker     __x_ Bathe/dress with assistance ___ Walk Independently    ___ Shower independently ___ Walk with assistance    ___ Shower with assistance ___ No alcohol     ___ Return to work/school ________  COMMUNITY REFERRALS UPON DISCHARGE:    Outpatient: PT     OT    S               Agency:[facility] Phone: [phone]              Appointment Date/Time: TBD  Medical Equipment/Items Ordered: NO DME                                                 Agency/Supplier:   Special Instructions: No driving smoking or alcohol     My questions have been answered and I understand these instructions. I will adhere to these goals and the provided educational materials after my discharge from the hospital.  Patient/Caregiver Signature _______________________________ Date __________  Clinician Signature _______________________________________ Date __________  Please bring this form and your medication list with you to all your follow-up doctor's appointments.

## 2020-12-27 NOTE — Progress Notes (Signed)
Inpatient Rehabilitation  Patient information reviewed and entered into eRehab system by Mixtli Reno M. Vora Clover, M.A., CCC/SLP, PPS Coordinator.  Information including medical coding, functional ability and quality indicators will be reviewed and updated through discharge.    

## 2020-12-28 LAB — CBC WITH DIFFERENTIAL/PLATELET
Abs Immature Granulocytes: 0.03 10*3/uL (ref 0.00–0.07)
Basophils Absolute: 0.1 10*3/uL (ref 0.0–0.1)
Basophils Relative: 1 %
Eosinophils Absolute: 0.3 10*3/uL (ref 0.0–0.5)
Eosinophils Relative: 4 %
HCT: 32 % — ABNORMAL LOW (ref 36.0–46.0)
Hemoglobin: 10.1 g/dL — ABNORMAL LOW (ref 12.0–15.0)
Immature Granulocytes: 0 %
Lymphocytes Relative: 17 %
Lymphs Abs: 1.3 10*3/uL (ref 0.7–4.0)
MCH: 25.8 pg — ABNORMAL LOW (ref 26.0–34.0)
MCHC: 31.6 g/dL (ref 30.0–36.0)
MCV: 81.8 fL (ref 80.0–100.0)
Monocytes Absolute: 0.6 10*3/uL (ref 0.1–1.0)
Monocytes Relative: 8 %
Neutro Abs: 5.7 10*3/uL (ref 1.7–7.7)
Neutrophils Relative %: 70 %
Platelets: 315 10*3/uL (ref 150–400)
RBC: 3.91 MIL/uL (ref 3.87–5.11)
RDW: 14.3 % (ref 11.5–15.5)
WBC: 8.1 10*3/uL (ref 4.0–10.5)
nRBC: 0 % (ref 0.0–0.2)

## 2020-12-28 LAB — COMPREHENSIVE METABOLIC PANEL
ALT: 19 U/L (ref 0–44)
AST: 11 U/L — ABNORMAL LOW (ref 15–41)
Albumin: 2.7 g/dL — ABNORMAL LOW (ref 3.5–5.0)
Alkaline Phosphatase: 131 U/L — ABNORMAL HIGH (ref 38–126)
Anion gap: 7 (ref 5–15)
BUN: 35 mg/dL — ABNORMAL HIGH (ref 8–23)
CO2: 22 mmol/L (ref 22–32)
Calcium: 9.2 mg/dL (ref 8.9–10.3)
Chloride: 107 mmol/L (ref 98–111)
Creatinine, Ser: 1.33 mg/dL — ABNORMAL HIGH (ref 0.44–1.00)
GFR, Estimated: 41 mL/min — ABNORMAL LOW (ref >60)
Glucose, Bld: 177 mg/dL — ABNORMAL HIGH (ref 70–99)
Potassium: 4.3 mmol/L (ref 3.5–5.1)
Sodium: 136 mmol/L (ref 135–145)
Total Bilirubin: 0.4 mg/dL (ref 0.3–1.2)
Total Protein: 6.1 g/dL — ABNORMAL LOW (ref 6.5–8.1)

## 2020-12-28 NOTE — Progress Notes (Signed)
Speech Language Pathology Daily Session Note  Patient Details  Name: Miku Greving MRN: [id number] Date of Birth: [birth year]  Today's Date: 12/28/2020 SLP Individual Time: [id number] SLP Individual Time Calculation (min): 43 min  Short Term Goals: Week 1: SLP Short Term Goal 1 (Week 1): STG=LTG due to short ELOS  Skilled Therapeutic Interventions:Skilled ST services focused on swallow and cognitive skills. Pt expressed continued coughing on thin liquids, as well as conflicting information pertaining to chronic verse acute swallow issues. Pt supported chronic esophogeal deficits, however imaging records are not listed on Epic from previous hospital. Pt consumed thin liquids via cup and straw with instruction for small sips and cease verbalization, however occasional delayed throat clear and x1 cough was noted. Pt also reported globus sensation. SLP ordered MBS to further investigate swallow function.  SLP educated pt on current medication name/function/time per day creating medication list, pt demonstrated mildly complex verbal problem solving but unable to complete pill organizer due to time restraint. Pt was left in room with call bell within reach and bed alarm set. SLP recommends to continue skilled services     Pain Pain Assessment Pain Scale: 0-10 Pain Score: 0-No pain Pain Type: Acute pain Pain Location: Leg (& sternum) Pain Orientation: Left;Mid Pain Descriptors / Indicators: Dull;Stabbing Pain Frequency: Intermittent Pain Intervention(s): Medication (See eMAR)  Therapy/Group: Individual Therapy  Addam Goeller  [facility] 12/28/2020, 4:23 PM
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
## 2020-12-28 NOTE — Progress Notes (Signed)
Occupational Therapy Session Note  Patient Details  Name: Lauren Craig MRN: [id number] Date of Birth: 04-08-1946  Today's Date: 12/28/2020 OT Individual Time: 1550-2714 OT Individual Time Calculation (min): 75 min    Short Term Goals: Week 1:  OT Short Term Goal 1 (Week 1): SRG=LTG d/t ELOS  Skilled Therapeutic Interventions/Progress Updates:     Pt received in EOB beginning breakfast.pt  with mild sternum/rib pain. Heat from shower provided for pain relief. ADL: Pt eating EOB with noticeable coughing. Pt educated on not talking during eating, taking small bites and sips, and alternating solid and liquids until SLP can give explicit instrucitons Pt completes bathing with Supervision seated on BSC  Pt completes UB dressing with set up Pt completes LB dressing with to don underwear only with CGA and pt leaves pants off so MD can look at skin tear (swollen/pus with RN alerted).  Pt completes footwear with MOD A to don shoes/pull heels into shoes Pt completes toileting with CGA for CM, hygiene seated Pt completes toileting transfer with CGA Pt completes shower/Tub transfer with CGA with no AD and increased lateral sway but no overt LOB. Grooming completed in standing.  Pt left at end of session in edge of bed with exit alarm on, call light in reach and all needs met   Therapy Documentation Precautions:  Precautions Precautions: Fall, Other (comment) Precaution Comments: monitor body mechanics Restrictions Weight Bearing Restrictions: No General:   Vital Signs:  Pain: Pain Assessment Pain Scale: 0-10 Pain Score: 0-No pain ADL: ADL Grooming: Contact guard Where Assessed-Grooming: Standing at sink Upper Body Bathing: Supervision/safety Where Assessed-Upper Body Bathing: Shower Lower Body Bathing: Minimal assistance Where Assessed-Lower Body Bathing: Shower Upper Body Dressing: Setup Where Assessed-Upper Body Dressing: Edge of bed Lower Body Dressing: Minimal  assistance Where Assessed-Lower Body Dressing: Edge of bed Toileting: Minimal assistance Where Assessed-Toileting: Glass blower/designer: Psychiatric nurse Method: Counselling psychologist: Geophysical data processor: Environmental education officer Method: Higher education careers adviser    Praxis   Exercises:   Other Treatments:     Therapy/Group: Individual Therapy  Tonny Branch 12/28/2020, 10:28 AM
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
## 2020-12-28 NOTE — Progress Notes (Signed)
Occupational Therapy Session Note  Patient Details  Name: Lauren Craig MRN: [id number] Date of Birth: [birth year]  Today's Date: 12/28/2020 OT Individual Time: 1133-1203 OT Individual Time Calculation (min): 30 min    Short Term Goals: Week 1:  OT Short Term Goal 1 (Week 1): SRG=LTG d/t ELOS  Skilled Therapeutic Interventions/Progress Updates:  Pt greeted EOB with family present and pt agreeable to OT intervention. Pt completed stand pivot transfer from EOB >w/c with CGA. Session focus on functional mobility in relation to IADL tasks at home. Overall pt requires CGA for functional mobility with no AD to collect IADLs at various surface heights in gym with no LOB,  pt able to sit to fold towels and reports that is her normal routine at home. Education provided on fall prevention strategies in relation to IADLs at home as well energy conservation strategies for home. Pt transported back to room with total A where pt left EOB with family present, bed alarm activated and all needs within reach.   Therapy Documentation Precautions:  Precautions Precautions: Fall, Other (comment) Precaution Comments: monitor body mechanics Restrictions Weight Bearing Restrictions: No  Pt reports pain in wound on RLE, nurse enter to dress wound, rest breaks provided throughout session as pain mgmt strategy.    Therapy/Group: Individual Therapy  [name] 12/28/2020, 3:56 PM

## 2020-12-28 NOTE — Care Management (Signed)
Inpatient [facility] Individual Statement of Services  Patient Name:  Lauren Craig  Date:  12/28/2020  Welcome to the [facility].  Our goal is to provide you with an individualized program based on your diagnosis and situation, designed to meet your specific needs.  With this comprehensive rehabilitation program, you will be expected to participate in at least 3 hours of rehabilitation therapies Monday-Friday, with modified therapy programming on the weekends.  Your rehabilitation program will include the following services:  Physical Therapy (PT), Occupational Therapy (OT), Speech Therapy (ST), 24 hour per day rehabilitation nursing, Therapeutic Recreaction (TR), Psychology, Neuropsychology, Care Coordinator, Rehabilitation Medicine, [place], and Other  Weekly team conferences will be held on Wednesdays to discuss your progress.  Your Inpatient Rehabilitation Care Coordinator will talk with you frequently to get your input and to update you on team discussions.  Team conferences with you and your family in attendance may also be held.  Expected length of stay: 7-10 days    Overall anticipated outcome: Independent with an Assistive Device  Depending on your progress and recovery, your program may change. Your Inpatient Rehabilitation Care Coordinator will coordinate services and will keep you informed of any changes. Your Inpatient Rehabilitation Care Coordinator's name and contact numbers are listed  below.  The following services may also be recommended but are not provided by the [facility] will be made to provide these services after discharge if needed.  Arrangements include referral to agencies that provide these services.  Your insurance has been verified to be:   [employer]  Your primary doctor is:  Dorris Singh  Pertinent information will be shared with your doctor and your insurance company.  Inpatient Rehabilitation Care Coordinator:  Erlene Quan, [place] or [phone]  Information discussed with and copy given to patient by: Rana Snare, 12/28/2020, 10:14 AM

## 2020-12-28 NOTE — Progress Notes (Signed)
Blood Pressure on the high side this morning @ 5am 171/61 MAP 88. Rechecked after 30 mins and documented. Patient asymptomatic. To have am staff Follow up with Team this morning.

## 2020-12-28 NOTE — Progress Notes (Signed)
Patient was on consult list for an AD. Chaplain took one to her room and she said she already had one and did not need one. Family was bedside and they seemed to agree. Chaplain visited with family and patient and is available when needed.    12/28/20 1200  Clinical Encounter Type  Visited With Patient and family together  Visit Type Initial  Referral From Nurse  Consult/Referral To Chaplain  Spiritual Encounters  Spiritual Needs Literature

## 2020-12-28 NOTE — Progress Notes (Signed)
Physical Therapy Session Note  Patient Details  Name: Lauren Craig MRN: [id number] Date of Birth: 09-18-1945  Today's Date: 12/28/2020 PT Individual Time: 1000-1100 PT Individual Time Calculation (min): 60 min   Short Term Goals: Week 1:  PT Short Term Goal 1 (Week 1): = to LTGs based on ELOS  Skilled Therapeutic Interventions/Progress Updates:     Pt sitting EOB at start of session. RN at bedside providing education on CVA and pt agreeable to therapy session. Reports mild pain in sternum and R leg. R leg with some edema and swelling around laceration - RN to apply dressing at end of session. Pt donned pants with setupA - able to thread them while sitting at EOB and pulls them over hips in standing with supervision. Pt requesting to use bathroom prior to leaving room to rehab gym. Sit<>stand with supervision and ambulates with CGA and no AD to her bathroom - able to manage clothes in standing with supervision and pt continent of bladder. Sit<>stand from low sitting toilet with supervision and ambulates to sink where she washes hands with supervision as well. Pt transported in w/c from her room to main rehab gym for time. Completed BEG balance test (outlined below with details) where she scored 37/56 - score indicative of increased falls risk. Score mostly impacted by single leg stance, alternating toe taps, and need for supervision for higher level balance. Pt then ambulated ~153f with mostly CGA and no AD in hallways with straight path gait, conversant throughout for dual-cog tasking and had x1 LOB to the R requiring minA for correction. Pt then wheeled to her room for time in her w/c and retrieved 16x16 roho cushion for w/c to optimize pressure distribution and preserve skin integrity while sitting in w/c. Pt completed ambulatory transfer to EOB with CGA and requested to remain seated EOB at end of session as her daughter and daughter's boyfriend were at the bedside. Bed alarm set and all needs in  reach. RN notified at end of session for dressing change.    Therapy Documentation Precautions:  Precautions Precautions: Fall, Other (comment) Precaution Comments: monitor body mechanics Restrictions Weight Bearing Restrictions: No General:    Balance: Balance Balance Assessed: Yes Standardized Balance Assessment Standardized Balance Assessment: Berg Balance Test Berg Balance Test Sit to Stand: Able to stand without using hands and stabilize independently Standing Unsupported: Able to stand safely 2 minutes Sitting with Back Unsupported but Feet Supported on Floor or Stool: Able to sit safely and securely 2 minutes Stand to Sit: Sits safely with minimal use of hands Transfers: Able to transfer safely, definite need of hands Standing Unsupported with Eyes Closed: Able to stand 10 seconds with supervision Standing Ubsupported with Feet Together: Able to place feet together independently and stand for 1 minute with supervision From Standing, Reach Forward with Outstretched Arm: Can reach forward >5 cm safely (2") From Standing Position, Pick up Object from Floor: Able to pick up shoe, needs supervision From Standing Position, Turn to Look Behind Over each Shoulder: Looks behind one side only/other side shows less weight shift Turn 360 Degrees: Able to turn 360 degrees safely but slowly Standing Unsupported, Alternately Place Feet on Step/Stool: Needs assistance to keep from falling or unable to try Standing Unsupported, One Foot in Front: Able to take small step independently and hold 30 seconds Standing on One Leg: Unable to try or needs assist to prevent fall Total Score: 37/56  Patient demonstrates increased fall risk as noted by score of  37/56 on Berg Balance Scale.  (<36= high risk for falls, close to 100%; 37-45 significant >80%; 46-51 moderate >50%; 52-55 lower >25%)  Therapy/Group: Individual Therapy  Tashawna Thom P Michaeleen Down 12/28/2020, 10:01 AM

## 2020-12-29 ENCOUNTER — Inpatient Hospital Stay (HOSPITAL_COMMUNITY): Payer: PPO

## 2020-12-29 NOTE — Progress Notes (Signed)
Physical Therapy Session Note  Patient Details  Name: Lauren Craig MRN: [id number] Date of Birth: [birth year]  Today's Date: 12/29/2020 PT Individual Time: 1022-1119 and 1310-1405 PT Individual Time Calculation (min): 57 min and 55 min  Short Term Goals: Week 1:  PT Short Term Goal 1 (Week 1): = to LTGs based on ELOS  Skilled Therapeutic Interventions/Progress Updates:    Session 1: Pt received asleep, supine in bed requiring increased time to awaken. Pt agreeable to therapy session. Supine>sitting R EOB, HOB slightly elevated and using bedrail, with light mod assist for trunk upright via HHA due to sternal/rib pain with this movement. Donned B LE knee high TED hose per MD order with pt noted to have edema in both lower legs (bandage intact on L shin). Sitting EOB donned pants with assist for threading LEs. Gait in room, no AD, with CGA for steadying while collecting items to comb hair at sink. Pt reports sudden urge to use bathroom. Gait in/out bathroom, no AD, with CGA for steadying - pt able to complete LB clothing management and peri-care without assist - continent of bladder. Standing hand hygiene at sink with close supervision/CGA.  Transported to/from gym in w/c for time management and energy conservation. Gait training ~148f, no AD, with CGA/light min assist for steadying - pt demos excessive R/L lateral trunk lean/sway during gait indicating trunk/hip weakness and impaired balance. Standing balance and B LE strengthening via alternate B LE foot taps on 4" step with min assist for balance - increased difficulty lifting R LE to place to step likely due to poor L weight shift during stance, improving with cues. Dynamic gait training via R/L lateral side stepping with CGA for steadying progressed to backwards ambulating ~24fwith light min assist and facilitation for increased weight shift for reciprocal stepping pattern. Throughout session pt demos fatigue requiring frequent seated rest breaks.  Gait training ~11079fack towards room, no AD, with CGA and w/c follow in event of fatigue - continues to demo above impairments. Transported remainder of distance back to room. Pt left seated EOB in care of MarCorinne PortsTA.  Session 2: Pt received sitting EOB and agreeable to therapy session. Reports some B LE muscle "soreness" from AM session. L stand pivot EOB>w/c, no AD, with CGA for steadying.  Transported to/from gym in w/c for time management and energy conservation. Gait training ~135f67fo AD, with CGA for steadying - pt continues to demo excessive R/L lateral trunk lean/sway during stance phases of gait likely indicating hip and trunk strength impairments - pt with difficulty correcting despite cuing.  Sit>supine on mat with head elevated on wedge due to sternal pain with full supine positioning and min assist for trunk descent. Supine bridging 2x15reps with level 2 theraband resistance around knees to promote increased hip abductor activation. Supine>sit heavy min assist for bringing trunk upright due to pain. Repeated sit<>stands to/from EOM with limited UE support on knees and level 2 theraband around knees to promote increased hip abductor activation 2x10 reps. Repeated lateral step up/down on/off 4" step in // bars for safety but quickly advanced from B UE support to no UE support with light min assist for balance - provided music to promote continuous rhythm and increased speed of movement with slight improvement noted. Gait training ~150ft72f UE support, with CGA progressing towards light min assist with fatigue - pt initially demonstrates improved trunk control with decreased R/L lateral sway but towards end of gait with fatigue demonstrates worsening of this  back towards as was seen above - continues to demo short steps and decreased gait speed. Transported to day room. Stand pivot w/c<>Nustep, no AD, with CGA. Performed B LE reciprocal movements patterns on Nustep against level 4 resistance for  5 minutes targeting continuous reciprocal pattern with increased B LE strengthening. Transported back to room. Stand pivot w/c>EOB, no AD, with CGA. Pt left seated EOB with needs in reach and bed alarm on.  Therapy Documentation Precautions:  Precautions Precautions: Fall, Other (comment) Precaution Comments: monitor body mechanics Restrictions Weight Bearing Restrictions: No   Pain: Session 1: Reports pain across chest/sternum while moving from supine>sitting - therapist provided assistance with this task for pain management.  Session 2: Reports pain in sternum and ribs when lying supine on mat - therapist provided adjustments to positioning for pain management as described above - RN present at end of session for medication administration.   Therapy/Group: Individual Therapy  Tawana Scale , PT, DPT, NCS, CSRS 12/29/2020, 8:01 AM

## 2020-12-29 NOTE — Progress Notes (Signed)
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   Vitamin B12 deficiency    Past Surgical History:  Past Surgical History:  Procedure Laterality Date   ABDOMINAL AORTOGRAM W/LOWER EXTREMITY N/A 08/11/2017   Procedure: ABDOMINAL AORTOGRAM W/LOWER EXTREMITY;  Surgeon: Angelia Mould, MD;  Location: [place] [facility];  Service: Cardiovascular;  Laterality: N/A;   ABDOMINAL HYSTERECTOMY     AMPUTATION Left 11/14/2019   Procedure: Left Fifth ray amputation;  Surgeon: Wylene Simmer, MD;  Location: [place];  Service: Orthopedics;  Laterality: Left;   AMPUTATION TOE Left 05/17/2018   Procedure: 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;  Surgeon: Wylene Simmer, MD;  Location: [place];  Service: Orthopedics;  Laterality: Left;   BIOPSY  10/29/2020   Procedure: BIOPSY;  Surgeon: Irene Shipper, MD;  Location: [facility] ENDOSCOPY;  Service: Endoscopy;;   ESOPHAGOGASTRODUODENOSCOPY (EGD) WITH PROPOFOL N/A 10/29/2020   Procedure: ESOPHAGOGASTRODUODENOSCOPY (EGD) WITH PROPOFOL;  Surgeon: Irene Shipper, MD;  Location: [facility] ENDOSCOPY;  Service: Endoscopy;  Laterality: N/A;   PARTIAL HYMENECTOMY  1968   TONSILLECTOMY AND ADENOIDECTOMY     VAGINAL HYSTERECTOMY  1987   Social History:  reports that she quit smoking about 18 years ago. Her smoking use included cigarettes. She smoked an average of 1 pack per day. She has never used  smokeless tobacco. She reports previous alcohol use of about 1.0 standard drink of alcohol per week. She reports that she does not use drugs.  Family / Support Systems Marital Status: Divorced Patient Roles: Parent Spouse/Significant Other: Divorced Children: 2 children- [name], and dtr [name]-passed at 1. She raised her teenage children [name] (F) and Insurance risk surveyor (M). [name] lives in [place]. Other Supports: A friend who will help Anticipated Caregiver: Pt dtr [name] who lives in her home. Ability/Limitations of Caregiver: Pt dtr and a friend Caregiver Availability: Intermittent Family Dynamics: Pt and dtr live in the home together.  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 Education: some college. 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 Legal History/Current Legal Issues: Denies Guardian/Conservator: N/A   Abuse/Neglect Abuse/Neglect Assessment Can Be Completed: Yes Physical Abuse: Denies Verbal Abuse: Denies Sexual Abuse: Denies Exploitation of patient/patient's resources: Denies Self-Neglect: Denies  Emotional Status [name] affect, behavior and adjustment status: Pt in good spirits at time of visit. Recent Psychosocial Issues: Pt denies Psychiatric History: Denies Substance Abuse History: Denies  Patient / Family Perceptions, Expectations & Goals Pt/Family understanding of illness & functional limitations: Pt has  a general understanding of pt care needs Premorbid pt/family roles/activities: Independent Anticipated  changes in roles/activities/participation: Assistance with ADLs/IADls Pt/family expectations/goals: Pt goal is to improve speech and balance.  Community Resources Express Scripts: None Premorbid Home Care/DME Agencies: None Transportation available at discharge: Dtr Resource referrals recommended: Neuropsychology  Discharge Planning Living Arrangements: Children Support Systems: Children, Water engineer, Other relatives Type of Residence: Private residence Insurance Resources: Multimedia programmer (specify) ([employer]) Museum/gallery curator Resources: Employment, Fish farm manager, Other (Comment) (pension) Financial Screen Referred: No Living Expenses: Own Money Management: Patient Does the patient have any problems obtaining your medications?: No Home Management: Pt managed all homecare needs Patient/Family Preliminary Plans: TBD Care Coordinator Barriers to Discharge: Decreased caregiver support, Lack of/limited family support Care Coordinator Anticipated Follow Up Needs: HH/OP Expected length of stay: 7-10 days  Clinical Impression This SW covering for primary SW, [employer].   SW met with pt in room at bedside to introduce self, explain role, and discuss discharge process. Pt is not a English as a second language teacher. HCPOA is her dtr [place]. DME: crutches, RW, cane, w/c, and walk-in shower with built in shower seat with sliding glass doors and grab bars; high rise toilet seats. Pt aware Margreta Journey will follow-up.   Nicholaos Schippers A Askia Hazelip 12/29/2020, 8:58 AM
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
## 2020-12-29 NOTE — Progress Notes (Signed)
Modified Barium Swallow Progress Note  Patient Details  Name: Lauren Craig MRN: [id number] Date of Birth: 04-28-1946  Today's Date: 12/29/2020  Modified Barium Swallow completed.  Full report located under Chart Review in the Imaging Section.  Brief recommendations include the following:  Clinical Impression   Pt presents with mild pharyngeal dysphagia characterized by delayed timing in swallow initiation and hyperextension of head positioning resulting in one occurrence of trace silent aspiration (PAS 8) on thin liquids when consuming barium tablet. Pt demonstrates swallow initiation of thin and nectar thick liquids at the pyriform sinuses and dys 1/dys 3 textures at the valleculae. Pt demonstrates appropriate oral manipulation, AP transport and oral clearances on all assessed textures. Pt demonstrates mild valleculae and trace pyriform sinuses residue on thin liquids which is primarily cleared in automatic second swallow. Pt demonstrates consistent penetration that was cleared during the swallow (PAS 2) and occasional unclear penetration (PAS 3) when consuming larger cup/straw sips of thin liquids. Compensatory strategy of chin tuck was slightly more effective in protecting airway resulting in fewer penetration occurrences. The only noted aspiration that occurred (PAS 8, trace silent), was during an attempt to swallow barium tablet with thin liquids. Pt required nectar thick liquid to complete AP transport of barium tablet and no issues were noted following the tablet down the esophagus. No overt s/s aspiration occurred during this assessment as noted at bedside, if this occurs after following the below listed strategies, implementation of compensatory chin tuck is recommended with thin liquids. SLP recommends regular textures and thin liquid diet, with precautions keep head in neutral position, consume small sips, separate solids/liquids and remain up right for 30-45 minutes after PO consumption. Pt  supports h/o of esophageal impairments. Medication whole in puree or very small pills one a time with thin liquids. SLP can aid in increasing swallow initiation timing with use of supraglottic swallow and Mendelsohn exercises.    Swallow Evaluation Recommendations       SLP Diet Recommendations: Thin liquid;Regular solids   Liquid Administration via: Cup;Straw   Medication Administration: Whole meds with puree   Supervision: Patient able to self feed   Compensations: Minimize environmental distractions;Slow rate;Small sips/bites   Postural Changes: Seated upright at 90 degrees;Remain semi-upright after after feeds/meals (Comment)   Oral Care Recommendations: Oral care BID        Millan Legan 12/29/2020,1:32 PM

## 2020-12-29 NOTE — Progress Notes (Signed)
Occupational Therapy Session Note  Patient Details  Name: Lauren Craig MRN: [id number] Date of Birth: 1945/07/19  Today's Date: 12/29/2020 OT Individual Time: [id number] OT Individual Time Calculation (min): 46 min    Short Term Goals: Week 1:  OT Short Term Goal 1 (Week 1): SRG=LTG d/t ELOS  Skilled Therapeutic Interventions/Progress Updates:  Pt received from PT, pt reports wanting to shower. Pt completed ambulatory shower transfer from EOB> shower with CGA and no AD. Pt required set- up of shampoo/ conditioner for bathing with pt completing multiple sit<>stands from 3n1 with supervision during LB bathing using grab bar for balance. Pt returned from shower in similar fashion as previously indicated. Pt completed dressing from EOB with set- up assist, MAX A to don foot wear. Pt left seated EOB with bed alarm activated and all needs within reach.  Therapy Documentation Precautions:  Precautions Precautions: Fall, Other (comment) Precaution Comments: monitor body mechanics Restrictions Weight Bearing Restrictions: No General:   Vital Signs:  Pain: Pt reports pain in chest, offered rest breaks and repositioning as pain mgmt strategies.    Therapy/Group: Individual Therapy  Corinne Ports [facility] 12/29/2020, 12:22 PM

## 2020-12-29 NOTE — Progress Notes (Signed)
Inpatient Rehabilitation Care Coordinator Assessment and Plan Patient Details  Name: Lauren Craig MRN: [id number] Date of Birth: Sep 21, 1945  Today's Date: 12/29/2020  Hospital Problems: Principal Problem:   Infarction of left basal ganglia [facility])  Past Medical History:  Past Medical History:  Diagnosis Date   Anxiety disorder    Arthritis    hands, hips   Essential (primary) hypertension 1990   Mixed hyperlipidemia 2010   Osteomyelitis ([place])    left 5th toe   PVD (peripheral vascular disease) ([place])    Seasonal allergies    Stroke ([place])    Type 2 diabetes mellitus with diabetic cataract ([place]) 2002   Vitamin B12 deficiency    Past Surgical History:  Past Surgical History:  Procedure Laterality Date   ABDOMINAL AORTOGRAM W/LOWER EXTREMITY N/A 08/11/2017   Procedure: ABDOMINAL AORTOGRAM W/LOWER EXTREMITY;  Surgeon: Angelia Mould, MD;  Location: [place] [facility];  Service: Cardiovascular;  Laterality: N/A;   ABDOMINAL HYSTERECTOMY     AMPUTATION Left 11/14/2019   Procedure: Left Fifth ray amputation;  Surgeon: Wylene Simmer, MD;  Location: [place];  Service: Orthopedics;  Laterality: Left;   AMPUTATION TOE Left 05/17/2018   Procedure: 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;  Surgeon: Wylene Simmer, MD;  Location: [place];  Service: Orthopedics;  Laterality: Left;   BIOPSY  10/29/2020   Procedure: BIOPSY;  Surgeon: Irene Shipper, MD;  Location: [facility] ENDOSCOPY;  Service: Endoscopy;;   ESOPHAGOGASTRODUODENOSCOPY (EGD) WITH PROPOFOL N/A 10/29/2020   Procedure: ESOPHAGOGASTRODUODENOSCOPY (EGD) WITH PROPOFOL;  Surgeon: Irene Shipper, MD;  Location: [facility] ENDOSCOPY;  Service: Endoscopy;  Laterality: N/A;   PARTIAL HYMENECTOMY  1968   TONSILLECTOMY AND ADENOIDECTOMY     VAGINAL HYSTERECTOMY  1987   Social History:  reports that she quit smoking about 18 years ago. Her smoking use included cigarettes. She smoked an average of 1 pack per day. She has never used  smokeless tobacco. She reports previous alcohol use of about 1.0 standard drink of alcohol per week. She reports that she does not use drugs.  Family / Support Systems Marital Status: Divorced Patient Roles: Parent Spouse/Significant Other: Divorced Children: 2 children- Lauren Craig, and dtr Lauren Craig-passed at 63. She raised her teenage children Lauren Craig (F) and Insurance risk surveyor (M). Lauren Craig lives in [place]. Other Supports: A friend who will help Anticipated Caregiver: Granddaughter:Lauren Craig - [phone] Ability/Limitations of Caregiver: [name] daughter can assist.  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 Education: some college. Read: Yes Write: Yes Employment Status: Employed Name of Employer: [employer] of Employment: 15 (years) Return to Work Plans: TBD, plans to retire Public relations account executive Issues: Denies Guardian/Conservator: Information systems manager   Abuse/Neglect Abuse/Neglect Assessment Can Be Completed: Yes Physical Abuse: Denies Verbal Abuse: Denies Sexual Abuse: Denies Exploitation of patient/patient's resources: Denies Self-Neglect: Denies  Emotional Status [name] affect, behavior and adjustment status: Pt in good spirits at time of visit. Recent Psychosocial Issues: coping Psychiatric History: n/a Substance Abuse History: n/a  Patient / Family Perceptions, Expectations & Goals Pt/Family understanding of illness & functional limitations: yes Premorbid pt/family roles/activities: Patient independent and working Anticipated changes in roles/activities/participation: Daughter able to assist  with roles and task Pt/family expectations/goals: MOD I  [employer]: None Premorbid Home Care/DME Agencies: Other (Comment) (Crutches, RW, Cane , WC, shower seat, sliding board, grab bars) Transportation available at discharge: Daughter able to transport Resource referrals recommended: Neuropsychology (coping)  Discharge Planning Living Arrangements: Children Support Systems: Children Type of Residence: Private residence (Lives with daughter) Insurance Resources: Chartered certified accountant Resources: Employment Financial Screen Referred: No Living Expenses: Lives with family Money Management: Patient Does the patient have any problems obtaining your medications?: No Home Management: Independent Patient/Family Preliminary Plans: Pt plans to reach MOD I goals Care Coordinator Barriers to Discharge: Decreased caregiver support, Lack of/limited family support Care Coordinator Anticipated Follow Up Needs: HH/OP Expected length of stay: 7-10 Days  Clinical Impression Patient plans to discharge home with daughter. Granddaughter able to provide care. Patient will have 24/7 supervision.   [name] 12/29/2020, 11:40 AM

## 2020-12-30 ENCOUNTER — Ambulatory Visit: Payer: PPO | Admitting: Nurse Practitioner

## 2020-12-30 MED ORDER — DOXYCYCLINE HYCLATE 100 MG PO TABS
100.0000 mg | ORAL_TABLET | Freq: Two times a day (BID) | ORAL | Status: DC
  Administered 2020-12-30 – 2020-12-31 (×4): 100 mg via ORAL
  Filled 2020-12-30 (×5): qty 1
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
## 2020-12-30 NOTE — Progress Notes (Signed)
Occupational Therapy Session Note  Patient Details  Name: Emmarose Klinke MRN: [id number] Date of Birth: 19-Jun-1945  Today's Date: 12/30/2020 OT Individual Time: 1345-1455 OT Individual Time Calculation (min): 70 min    Short Term Goals: Week 1:  OT Short Term Goal 1 (Week 1): SRG=LTG d/t ELOS   Skilled Therapeutic Interventions/Progress Updates:    Pt received supine, initially declining session stating "my doctor told me to stay off my L leg". Reviewed MD note and orders and found no evidence to support this so encouraged OOB mobility with shower and pt was agreeable. Bed mobility to EOB with min lifting assist. Pt completed ambulatory transfer to the toilet with CGA- supervision. Pt completed toileting tasks with supervision overall. Transfer into shower with CGA. All bathing completed with supervision. Pt completed transfer to EOB and asked OT to look at her sacrum- area of very dry skin. Placed foam bandage on her sacrum and she reported relief. Pt donned underwear with supervision. She was verbose and required intermittent redirection to task. 200 ft of functional mobility with CGA. Pt was left sitting EOB with all needs met, bed alarm set.   Therapy Documentation Precautions:  Precautions Precautions: Fall, Other (comment) Precaution Comments: monitor body mechanics Restrictions Weight Bearing Restrictions: No Therapy/Group: Individual Therapy  Curtis Sites 12/30/2020, 6:10 AM

## 2020-12-30 NOTE — Progress Notes (Signed)
Speech Language Pathology Daily Session Note  Patient Details  Name: Lauren Craig MRN: [id number] Date of Birth: Nov 01, 1945  Today's Date: 12/30/2020 SLP Individual Time: 1100-1200 SLP Individual Time Calculation (min): 60 min  Short Term Goals: Week 1: SLP Short Term Goal 1 (Week 1): STG=LTG due to short ELOS  Skilled Therapeutic Interventions: Patient agreeable to skilled ST intervention with focus on cognitive and swallow goals. Reinforced education from [facility] yesterday including recommendations for safe swallow precautions and strategies to minimize risk for aspiration. Srategies included keeping head in neutral position and upright positioning in chair and bed, consuming small bites/sips, consuming solids and liquids separately,  and remaining upright for 30-45 minutes following PO consumption 2/2 esophageal hx. Patient verbalized understanding of strategies with teach back. Patient was instructed to perform supraglottic swallow for additional airway protection. Patient returned demonstration and performed effectively with liquid sips. She was concerned about experiencing discomfort in chest/abdominal area however denied any pain. SLP provided education compensatory memory strategies including use of weekly pillbox. Patient indicated interest in pursuing for organization/memory strategy. Unable to initiate pillbox organization due to time constraints however plan to address during upcoming sessions.    Pain Assessment Pain Scale: 0-10 Pain Score: 0-No pain  Therapy/Group: Individual Therapy  Patty Sermons 12/30/2020, 3:21 PM
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
## 2020-12-30 NOTE — Plan of Care (Signed)
  Problem: Consults Goal: RH STROKE PATIENT EDUCATION Description: See Patient Education module for education specifics  Outcome: Progressing   Problem: RH BOWEL ELIMINATION Goal: RH STG MANAGE BOWEL WITH ASSISTANCE Description: STG Manage Bowel with mod I Assistance. Outcome: Progressing Goal: RH STG MANAGE BOWEL W/MEDICATION W/ASSISTANCE Description: STG Manage Bowel with Medication with mod I Assistance. Outcome: Progressing   Problem: RH SAFETY Goal: RH STG ADHERE TO SAFETY PRECAUTIONS W/ASSISTANCE/DEVICE Description: STG Adhere to Safety Precautions With cues/reminders Assistance/Device. Outcome: Progressing   Problem: RH KNOWLEDGE DEFICIT Goal: RH STG INCREASE KNOWLEDGE OF DIABETES Description: Patient will be able to manage DM with dietary modifications using handouts and educational resources independently Outcome: Progressing Goal: RH STG INCREASE KNOWLEDGE OF HYPERTENSION Description: Patient will be able to manage HTN  with medications and dietary modifications using handouts and educational resources independently Outcome: Progressing Goal: RH STG INCREASE KNOWLEDGE OF STROKE PROPHYLAXIS Description: Patient will be able to manage secondary stroke risks  with medications and dietary modifications using handouts and educational resources independently Outcome: Progressing   Problem: RH KNOWLEDGE DEFICIT Goal: RH STG INCREASE KNOWLEGDE OF HYPERLIPIDEMIA Description: Patient will be able to manage HLD  with medications and dietary modifications using handouts and educational resources independently Outcome: Progressing

## 2020-12-30 NOTE — Progress Notes (Signed)
Physical Therapy Session Note  Patient Details  Name: Lauren Craig MRN: [id number] Date of Birth: 1946/03/22  Today's Date: 12/30/2020 PT Individual Time: 1005-1020 and [id number] PT Individual Time Calculation (min): 15 min  and 41 min and  Today's Date: 12/30/2020 PT Missed Time: 15 Minutes Missed Time Reason: Pain  Short Term Goals: Week 1:  PT Short Term Goal 1 (Week 1): = to LTGs based on ELOS  Skilled Therapeutic Interventions/Progress Updates:    Session 1: Pt received supine in bed with a face of discomfort. Pt reports that she is concerned about L lower leg wound and that MD started her on antibiotics. Pt reports she is having increased R shoulder and rib pain - pt states that RN recently provided pain medication but that her pain has not yet started to ease off. Pt requests to rest at this time but agreeable for assistance with therapeutic repositioning in the bed. Donned B LE TED hose for edema management. Therapist assisted with repositioning in the bed and elevating B LEs along with improving spine/trunk alignment. Supine>sitting with min assist for trunk ascent due to chest/sternum pain and then only CGA for descent using bedrail. Pt left supine in bed with needs in reach and bed alarm on. Missed 15 minutes of skilled physical therapy.  Session 2: Pt received sitting EOB and reports feeling much better compared to this AM after resting and eating. Pt agreeable to therapy session. Therapist educated pt on performing gait training without an AD at this time to challenge patient; however, discussed that pt will likely need an AD upon D/C to improve balance and decrease fall risk - pt in agreement with this plan. Gait training ~15f towards therapy gym, no AD, with CGA for steadying and therapist bringing w/c in event of fatigue - pt is demonstrating increasing gait speed and increased B LE step lengths though still has R/L lateral trunk sway/lean throughout. Dynamic gait training in //  bars for safety but not using UE support via R/L lateral side stepping over small thresholds with pt demonstrating increased difficulty/more impaired balance stepping towards L compared to R (pt reports this is due to L lower leg wound discomfort causing pt to favor that side). Progressed to backwards stepping over small thresholds with CGA/min assist for balance. Gait training ~1143fback towards room, no AD, with CGA and therapist continuing to bring w/c in event of fatigue. Transported remainder of distance back to room. Stand pivot w/c>EOB, no AD, with CGA for steadying. Pt left seated EOB with needs in reach and bed alarm on.   Therapy Documentation Precautions:  Precautions Precautions: Fall, Other (comment) Precaution Comments: monitor body mechanics Restrictions Weight Bearing Restrictions: No   Pain:  Session 1: Reports significant R shoulder and rib/sternal pain - details above - premedicated.   Session 2: Continues to report sternal pain, premedicated, avoided exercises that would cause strain in that region; however, pt states any and all movement causes increased discomfort.   Therapy/Group: Individual Therapy  CaTawana Scale PT, DPT, NCS, CSRS 12/30/2020, 7:57 AM

## 2020-12-30 NOTE — Progress Notes (Signed)
Patient ID: Lauren Craig, female   DOB: 02-12-1946, 75 y.o.   MRN: [id number] Team Conference Report to Patient/Family  Team Conference discussion was reviewed with the patient and caregiver, including goals, any changes in plan of care and target discharge date.  Patient and caregiver express understanding and are in agreement.  The patient has a target discharge date of 01/05/21.  SW met with patient, patient did not want to call daughter. Provided updates. SW will set patient up at Neuro OP. No additional questions or concerns.   Lauren Craig 12/30/2020, 2:07 PM

## 2020-12-31 MED ORDER — INDAPAMIDE 2.5 MG PO TABS
5.0000 mg | ORAL_TABLET | Freq: Every day | ORAL | Status: DC
  Administered 2021-01-01 – 2021-01-05 (×5): 5 mg via ORAL
  Filled 2020-12-31 (×5): qty 2

## 2020-12-31 NOTE — Progress Notes (Signed)
Physical Therapy Session Note  Patient Details  Name: Lauren Craig MRN: [id number] Date of Birth: 07-01-1945  Today's Date: 12/31/2020 PT Individual Time: [id number] PT Individual Time Calculation (min): 32 min   Short Term Goals: Week 1:  PT Short Term Goal 1 (Week 1): = to LTGs based on ELOS  Skilled Therapeutic Interventions/Progress Updates:    Pt received sitting EOB and agreeable to therapy session. Pt reports back pain - therapist inquired if pt would prefer to sit in a chair to provide back support during the day as opposed so sitting EOB - pt in agreement to this. Reinforced prior education regarding recommendation to use AD upon D/C to improve balance and decrease fall risk - pt in agreement. Sit>stand EOB>RW with CGA and cuing for safe hand placement. Gait training ~245f to main therapy gym using RW with pt demoing improved balance (no longer having significant R/L lateral trunk leans as seen without AD) and pt reporting feeling increased balance as well as improved walking endurance with pt able to ambulate whole distance to gym without a seated break. Dynamic standing/gait training task via combination of alternate B LE foot taps on 6" steps with side stepping over small thresholds all without UE support - requires CGA/light min assist for balance with pt demonstrating significant improvement in her balance. Gait training ~2012fback to room using RW with CGA progressing to supervision - significant endurance improvements noted with AD. Pt left seated in chair with chair alarm on and needs in reach.   Therapy Documentation Precautions:  Precautions Precautions: Fall, Other (comment) Precaution Comments: monitor body mechanics Restrictions Weight Bearing Restrictions: No   Pain:  Reports back pain, unrated, but reports she recently asked nursing for medication - recommended pt trial chair with back support during day for pain management, assisted to sit in chair at end of  session.   Therapy/Group: Individual Therapy  CaTawana Scale PT, DPT, NCS, CSRS 12/31/2020, 3:11 PM
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
## 2020-12-31 NOTE — Progress Notes (Signed)
Occupational Therapy Session Note  Patient Details  Name: Lauren Craig MRN: [id number] Date of Birth: Aug 11, 1945  Today's Date: 12/31/2020 OT Individual Time: 1000-1059 OT Individual Time Calculation (min): 59 min    Short Term Goals: Week 1:  OT Short Term Goal 1 (Week 1): SRG=LTG d/t ELOS  Skilled Therapeutic Interventions/Progress Updates:  Pt greeted EOB agreeable to OT intervention. Session focus on BADL reeducation with pt completing full shower with supervision for bathing from shower seat. Pt completed functional mobility throughout session with no AD and CGA. Pt completed UB dressing and LB dressing with set- up assist from EOB, supervision for sit<>stands to pull pants up to waist line. Pt transported by this OTA to group session.   Therapy Documentation Precautions:  Precautions Precautions: Fall, Other (comment) Precaution Comments: monitor body mechanics Restrictions Weight Bearing Restrictions: No   Pain: Pt reports pain in chest, offered rest breaks and repositioning as pain mgmt strategy/    Therapy/Group: Individual Therapy  [name] 12/31/2020, 12:17 PM

## 2020-12-31 NOTE — Progress Notes (Signed)
Speech Language Pathology Daily Session Note  Patient Details  Name: Decora Almanza MRN: [id number] Date of Birth: January 18, 1946  Today's Date: 12/31/2020 SLP Individual Time: [id number] SLP Individual Time Calculation (min): 45 min  Short Term Goals: Week 1: SLP Short Term Goal 1 (Week 1): STG=LTG due to short ELOS  Skilled Therapeutic Interventions: Patient agreeable to skilled ST intervention with focus on cognitive goals. Facilitated TID pillbox organization with sup A for organization accuracy. Patient exhibited error during 1/5 occasions and did not display awareness of error following cue to re-assess pillbox. Patient identified error during third attempt. No additional organization errors were made during subsequent occasions. She answered medication safety and organization questions with effective logic and anticipatory awareness. Patient was left in bed with alarm activated and immediate needs within reach at end of session. Continue per current plan of care.      Pain Pain Assessment Pain Scale: 0-10 Pain Score: 6  Pain Intervention(s): RN made aware 2nd Pain Site Pain Location: Sternum  Therapy/Group: Individual Therapy  Royalti Schauf T Yuuki Skeens 12/31/2020, 9:09 AM

## 2020-12-31 NOTE — Progress Notes (Signed)
Occupational Therapy Session Note  Patient Details  Name: Lauren Craig MRN: [id number] Date of Birth: 1946-05-19  Today's Date: 12/31/2020 OT Group Time: 1100-1202 OT Group Time Calculation (min): 62 min   Short Term Goals: Week 1:  OT Short Term Goal 1 (Week 1): SRG=LTG d/t ELOS  Skilled Therapeutic Interventions/Progress Updates:  Pt participate in group session with a focus on therapeutic activity of Bowling to facilitate magagement of w/c, hand-eye- coordination, UB strength, core strength, social interaction, and increasing activity tolerance. Pt completed bowling turns via ambulation with no AD and CGA. Pt used BUEs to bowl, pt able to write score on score board and able to walk with OTA to end of bowling lane where OTA p/u bowling ball for pt.  Pt with no c/o pain during session, pt actively interacting with other group members by  providing encouragement to other group members. Pt transported back to room by RT.   Therapy Documentation Precautions:  Precautions Precautions: Fall, Other (comment) Precaution Comments: monitor body mechanics Restrictions Weight Bearing Restrictions: No   Pain: Pt reports no pain during session.    Therapy/Group: Group Therapy  [name] 12/31/2020, 12:29 PM

## 2021-01-01 LAB — CREATININE, SERUM
Creatinine, Ser: 1.32 mg/dL — ABNORMAL HIGH (ref 0.44–1.00)
GFR, Estimated: 42 mL/min — ABNORMAL LOW (ref >60)

## 2021-01-01 MED ORDER — SODIUM CHLORIDE 0.9 % IV SOLN
100.0000 mg | Freq: Two times a day (BID) | INTRAVENOUS | Status: DC
  Administered 2021-01-01 – 2021-01-04 (×9): 100 mg via INTRAVENOUS
  Filled 2021-01-01 (×11): qty 100

## 2021-01-01 MED ORDER — SORBITOL 70 % SOLN
60.0000 mL | Freq: Once | Status: DC
  Filled 2021-01-01: qty 60

## 2021-01-01 MED ORDER — SODIUM CHLORIDE 0.9 % IV SOLN
INTRAVENOUS | Status: DC | PRN
  Administered 2021-01-01 – 2021-01-02 (×2): 250 mL via INTRAVENOUS
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
## 2021-01-01 NOTE — Progress Notes (Signed)
Occupational Therapy Note  Patient Details  Name: Lauren Craig MRN: [id number] Date of Birth: 08-26-1945  Today's Date: 01/01/2021 OT Missed Time: 60 Minutes Missed Time Reason: Unavailable (comment);Other (comment) (pt finishing lunch, unable to attend group session)  Pt missed 60 mins of group session as pt was eating lunch and requested to finish lunch prior to session. Will f/u as time allows to make up missed minutes.   Corinne Ports [facility] 01/01/2021, 3:55 PM

## 2021-01-01 NOTE — Progress Notes (Signed)
Speech Language Pathology Daily Session Note  Patient Details  Name: Anastazia Spinola MRN: [id number] Date of Birth: [birth year]  Today's Date: 01/01/2021 SLP Individual Time: [id number] SLP Individual Time Calculation (min): 58 min  Short Term Goals: Week 1: SLP Short Term Goal 1 (Week 1): STG=LTG due to short ELOS  Skilled Therapeutic Interventions: Pt seen for skilled ST with focus on speech and cognitive goals. Pt demonstrates good awareness of dysarthric speech at complex conversation level and benefits from Supervision level cues for use of strategies as trained. 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 SLP facilitating medication management task by providing current list of medications, dosage and frequency. Nursing present during portion of task to aid in discussing any contraindications with meds. Pt able to recall ~75% of home medications independently, min cues increased to 100%. Pt states she wants to utilize pill box organizer at discharge vs her previous med management system of taking pills directly from bottle kept in white pharmacy bag. Unable to complete pill organization task due to time constraints. Will continue to focus on high level cognitive tasks for home, daughter can assist as needed. Pt left in wheelchair with alarm set and all needs within reach. Cont ST POC.   Pain Pain Assessment Pain Scale: 0-10 Pain Score: 8  Faces Pain Scale: Hurts a little bit Pain Type: Acute pain Pain Location: Rib cage Pain Orientation: Right Pain Radiating Towards: sternum Pain Descriptors / Indicators: Discomfort Pain Onset: On-going Pain Intervention(s): Medication (See eMAR)  Therapy/Group: Individual Therapy  Dewaine Conger 01/01/2021, 10:23 AM

## 2021-01-01 NOTE — Progress Notes (Signed)
Physical Therapy Session Note  Patient Details  Name: Lauren Craig MRN: [id number] Date of Birth: Dec 19, 1945  Today's Date: 01/01/2021 PT Individual Time: 0805-0848 PT Individual Time Calculation (min): 43 min   Short Term Goals: Week 1:  PT Short Term Goal 1 (Week 1): = to LTGs based on ELOS  Skilled Therapeutic Interventions/Progress Updates:    Pt sitting EOB drinking coffee and agreeable to therapy upon entry. Pt states she is concerned about swelling around wound on left shin and would like MD to take a look prior to OOB mobility, stating "I was told if I dont get this healed I could lose my leg." Pt completed change of clothing independently, supervision for balance when standing. MD assessed wound, assured pt of healing process.   STS throughout session with CGA-Close supervision. Gait training ~258ft to main therapy gym using RW CGA-close supervision. Pt demonstrated slight R/L lateral lean and slightly forward flexed posture that improved with verbal cuing. Gait training without RW ~2ft CGA. Dynamic balance without RW weaving around cones 4x12ft CGA-light MinA for balance. Pt demonstrated increased time with deliberate movements during turns around cones and slightly increased R/L lateral lean compared to RW. Lateral stepping in // bars 4x 63ft no BUE support CGA-MinA for balance. Verbal and visual cuing via mirror for correct form. Improvement in R/L lateral lean with visual cuing. Pt requested to be wheeled back to room 2/2 fatigue. Wc>chair transfer with CGA no AD.   Pt sitting in chair with chair alarm on, call bell/tray within reach, and mat pad under feet per request. All needs met at this time.   Therapy Documentation Precautions:  Precautions Precautions: Fall, Other (comment) Precaution Comments: monitor body mechanics Restrictions Weight Bearing Restrictions: No  Pain: Pain Assessment Pain Scale: Faces Faces Pain Scale: Hurts a little bit Pain Type: Acute pain Pain  Location: Sternum Pain Descriptors / Indicators: Aching;Discomfort Pain Onset: On-going    Therapy/Group: Individual Therapy  Torin Whisner, SPT 01/01/2021, 9:40 AM

## 2021-01-01 NOTE — Progress Notes (Signed)
Occupational Therapy Session Note  Patient Details  Name: Lauren Craig MRN: [id number] Date of Birth: Mar 13, 1946  Today's Date: 01/01/2021 OT Individual Time: [id number] OT Individual Time Calculation (min): 45 min    Short Term Goals: Week 1:  OT Short Term Goal 1 (Week 1): SRG=LTG d/t ELOS  Skilled Therapeutic Interventions/Progress Updates:  Pt greeted seated in straight back chair agreeable to OT intervention. Pt completed ambulatory shower transfer to walkin shower with no AD and CGA. Pt completed bathing with set- up assist from seated on shower seat, sit<>stands completed as needed with use of grab bars and supervision. Pt completed ambulatory transfer back to chair in similar fashion as previously indicted. Pt completed grooming tasks from sitting with set- up assist. Pt required set-up for UB/LB dressing via sit<>stands. Supervision for sit<>stand with no AD. Pt left seated in straight back chair with chair alarm activated and all needs within reach.   Therapy Documentation Precautions:  Precautions Precautions: Fall, Other (comment) Precaution Comments: monitor body mechanics Restrictions Weight Bearing Restrictions: No  Pain: Pt reports no pain during session    Therapy/Group: Individual Therapy  Corinne Ports [facility] 01/01/2021, 12:04 PM

## 2021-01-02 DIAGNOSIS — L03116 Cellulitis of left lower limb: Secondary | ICD-10-CM

## 2021-01-02 NOTE — Progress Notes (Signed)
Physical Therapy Session Note  Patient Details  Name: Lauren Craig MRN: [id number] Date of Birth: [birth year]  Today's Date: 01/02/2021 PT Individual Time: 1010-1104 and 1540-1615 PT Individual Time Calculation (min): 54 min and 35 min  Short Term Goals: Week 1:  PT Short Term Goal 1 (Week 1): = to LTGs based on ELOS  Skilled Therapeutic Interventions/Progress Updates:    Session 1: Pt received sitting on EOB and agreeable to therapy session despite reporting some fatigue. Discussed plan for upcoming discharge with recommendation for follow-up OPPT, recommendation to use RW for all standing/ambulation, and for initial 24hr support - pt in agreement with this plan. Sit<>stands using RW with supervision during session. Gait training ~256f to main therapy gym using RW with supervision - continues to have slow gait speed but decreased R/L lateral trunk lean/sway. Therapy session focused on providing pt HEP and reviewing exercises - performed at least 1 set of each of the following with cuing for proper technique as well as education on safe set-up at home. Pt demos good safety awareness throughout to perform these exercises with supervision initially quickly progressing to mod-I.   Access Code: [email or web address]: [email or web address] Date: 01/02/2021 Prepared by: CPage Spiro Exercises Walking - 1 x daily - 7 x weekly - 3 sets - 5 minutes hold Sit to Stand with Resistance Around Legs - 1 x daily - 7 x weekly - 2 sets - 15 reps Side Stepping with Resistance at Thighs and Counter Support - 1 x daily - 7 x weekly - 2 sets - 10 reps Alternating Step Taps with Counter Support - 1 x daily - 7 x weekly - 2 sets - 30 reps  Provided pt with HEP printout. Pt reports no questions/concerns. Therapist provided pt with walker bag. Gait training ~2052fback to room using RW with supervision as described above. Pt left seated EOB with needs in reach and bed alarm on.    Session 2: Pt received  sitting EOB and agreeable to therapy session. Sit>stand EOB>RW supervision - 1x cuing for correct hand placement when using AD with pt demonstrating recall of this during session without additional cues. Gait training ~2007fo main therapy gym using RW with supervision - continues to demo slower gait speed though improved along with excessive thoracic rounding/trunk flexion. Pt continues to demo improved endurance. Stair navigation training 12steps 6" height using B HRs via reciprocal pattern on ascent and step-to leading with R LE on descent with supervision for safety. Educated pt on stair safety at home to only perform when family present. Educated on curManufacturing systems engineer with pt demonstrating understanding 2x without cuing - supervision for safety. Pt reports main concern regarding D/C at this point is her R shoulder pain - therapist encouraged her to ask MD if there are any restrictions and educated pt on continuing to let pain be her guide by avoiding movements/excessive activity that results in increased pain. Gait training back to room using RW with supervision as above. Gait in/out bathroom using RW with supervision and therapist educated pt on proper AD management in/out of the bathroom - continent of bladder, seated peri-care without assist. Educated on proper AD management at sink counter. Pt left seated EOB with needs in reach and bed alarm on.    Therapy Documentation Precautions:  Precautions Precautions: Fall, Other (comment) Precaution Comments: monitor body mechanics Restrictions Weight Bearing Restrictions: No   Pain: Session 1: Reports R shoulder pain - premedicated - provided rest breaks and distraction for  pain management.  Session 2: Continues to report R shoulder pain stating she is living pain medicine dose by pain medicine dose - educated pt on healing process as well as consideration of follow-up OPPT if pt continues to have R shoulder pain/discomfort after typical  healing time has passed.   Therapy/Group: Individual Therapy  Tawana Scale , PT, DPT, NCS, CSRS 01/02/2021, 7:57 AM

## 2021-01-02 NOTE — Progress Notes (Signed)
Speech Language Pathology Discharge Summary  Patient Details  Name: Lauren Craig MRN: [id number] Date of Birth: [birth year]  Today's Date: 01/04/2021 SLP Individual Time: 6384-6659 SLP Individual Time Calculation (min): 45 min   Skilled Therapeutic Interventions:  Patient agreeable to skilled ST intervention with focus on cognitive goals. SLP facilitated session with overall sup-to-min A verbal cues for immediate recall of information expressed to her from various appointment cards.She recalled ~50% of pertinent details from memory without use of strategies. She was able to report pertinent details with 100% accuracy when she simultaneously wrote down the information on paper, a strategy in which she uses at prior level secondary to baseline memory deficits. Patient continues to endorse concern with reduced speech intelligibility, however her speech has consistently been perceived as >90% intelligible with excellent use of speech rate and over-articulation strategies at conversation level. Patient continues to exhibit reduced vocal intensity, however this does not impede on her ability to functionally communicate. Provided education and instruction on diaphragmatic breathing. Patient was able to return demonstration however she reported mild discomfort in sternum/chest region. Thus, patient encouraged to engage in communication exchange in quiet environment as much as possible if unable to obtain optimal breath support secondary to ongoing discomfort. Patient was left in bed with alarm activated and immediate needs within reach at end of session.   Patient has met 7 of 7 long term goals.  Patient to discharge at overall Modified Independent;Supervision level.   Reasons goals not met: N/A   Clinical Impression/Discharge Summary: Patient has made excellent gains and has met 7 of 7 long-term goals this admission. 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 Patient continues to exhibit reduced vocal intensity, however this does not impede on her ability to functionally communicate. She demonstrates good safety awareness and judgement and is able to provide logical solutions with emphasis on her safety and well-being. Patient is currently an overall mod I-to-sup A for basic cognitive tasks, and mod I for verbal expression with use of speech intelligibility strategies at conversation level. Requires min A for higher level cognition. Patient obtained a modified barium swallow study on 12/29/2020 revealing mild pharyngeal dysphagia with recommendations for regular diet and thin liquids with use of swallow strategies including keep head in neutral position, consume small sips, separate solids/liquids and remain up right for 30-45 minutes after PO consumption. Patient can independently recall and implement safe swallow strategies and precautions and exhibits no overt s/sx of aspiration or complaints with current diet. Education is complete and patient will discharge home with support from her family. Patient may benefit from follow up SLP services to maximize cognitive function and independence.   Care Partner:  Caregiver Able to Provide Assistance: Yes  Type of Caregiver Assistance: Physical;Cognitive  Recommendation:  Outpatient SLP  Rationale for SLP Follow Up: Maximize cognitive function and independence  Equipment: N/A   Reasons for discharge: Treatment goals met   Patient/Family Agrees with Progress Made and Goals Achieved: Yes Patty Sermons 01/04/2021, 4:42 PM

## 2021-01-02 NOTE — Progress Notes (Addendum)
Occupational Therapy Session Note  Patient Details  Name: Nneka Freese MRN: [id number] Date of Birth: [birth year]  Today's Date: 01/03/2021 OT Group Time:  - 60 minutes missed     Skilled Therapeutic Interventions/Progress Updates:    Per RT, pt declined participation in group today. 60 minutes missed.   Therapy Documentation Precautions:  Precautions Precautions: Fall, Other (comment) Precaution Comments: monitor body mechanics Restrictions Weight Bearing Restrictions: No  Vital Signs:  Pain: Pain Assessment Pain Scale: 0-10 Pain Score: 7  Faces Pain Scale: Hurts a little bit Pain Type: Acute pain Pain Location: Shoulder Pain Orientation: Right Pain Radiating Towards: arm, hand Pain Descriptors / Indicators: Discomfort Pain Frequency: Intermittent Pain Onset: Gradual Patients Stated Pain Goal: 1 Pain Intervention(s): Medication (See eMAR) ADL: ADL Grooming: Contact guard Where Assessed-Grooming: Standing at sink Upper Body Bathing: Supervision/safety Where Assessed-Upper Body Bathing: Shower Lower Body Bathing: Minimal assistance Where Assessed-Lower Body Bathing: Shower Upper Body Dressing: Setup Where Assessed-Upper Body Dressing: Edge of bed Lower Body Dressing: Minimal assistance Where Assessed-Lower Body Dressing: Edge of bed Toileting: Minimal assistance Where Assessed-Toileting: Glass blower/designer: Psychiatric nurse Method: Counselling psychologist: Sport and exercise psychologist Transfer: Environmental education officer Method: Ambulating     Therapy/Group: Group Therapy  Skeet Simmer 01/03/2021, 12:37 PM
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
## 2021-01-02 NOTE — Progress Notes (Signed)
Physical Therapy Discharge Summary  Patient Details  Name: Lauren Craig MRN: [id number] Date of Birth: September 12, 1945  Today's Date: 01/04/2021 PT Individual Time: 7939-0300 PT Individual Time Calculation (min): 54 min    Patient has met 9 of 9 long term goals due to improved activity tolerance, improved balance, improved postural control, increased strength, decreased pain, ability to compensate for deficits, functional use of  right lower extremity and left lower extremity, improved attention, and improved awareness.  Patient to discharge at an ambulatory level Modified Independent using RW, requiring supervision for car transfers. Patient's care partner is independent to provide the necessary physical and cognitive assistance at discharge.  Reasons goals not met: n/a  Recommendation:  Patient will benefit from ongoing skilled PT services in outpatient setting to continue to advance safe functional mobility, address ongoing impairments in B LE strength, dynamic standing balance, dynamic gait training with LRAD, community reintegration, and minimize fall risk.  Equipment: No equipment provided - pt has all necessary DME  Reasons for discharge: treatment goals met and discharge from hospital  Patient/family agrees with progress made and goals achieved: Yes  PT Discharge Precautions/Restrictions Precautions Precautions: Fall Restrictions Weight Bearing Restrictions: No Vision/Perception  Perception Perception: Within Functional Limits Praxis Praxis: Intact  Cognition Arousal/Alertness: Awake/alert Orientation Level: Oriented X4 Attention: Selective;Focused;Sustained Focused Attention: Appears intact Sustained Attention: Appears intact Selective Attention: Appears intact Safety/Judgment: Appears intact Sensation Sensation Light Touch: Appears Intact Hot/Cold: Not tested Proprioception: Appears Intact Stereognosis: Not tested Coordination Gross Motor Movements are Fluid and  Coordinated: Yes Fine Motor Movements are Fluid and Coordinated: Yes Motor  Motor Motor: Within Functional Limits Motor - Discharge Observations: General deconditioning impacting mobility and balance - significant improvement since date of evaluatoin  Mobility Transfers Transfers: Sit to Stand;Stand to Sit;Stand Pivot Transfers Sit to Stand: Independent with assistive device Stand to Sit: Independent with assistive device Stand Pivot Transfers: Independent with assistive device Transfer (Assistive device): Rolling walker Locomotion  Gait Ambulation: Yes Gait Assistance: Independent with assistive device Gait Distance (Feet): 200 Feet Assistive device: Rolling walker Gait Gait: Yes Gait Pattern: Within Functional Limits Gait velocity: decreased Stairs / Additional Locomotion Stairs: Yes Stairs Assistance: Independent with assistive device Stair Management Technique: Two rails Number of Stairs: 12 Height of Stairs: 6 Wheelchair Mobility Wheelchair Mobility: No  Trunk/Postural Assessment  Cervical Assessment Cervical Assessment: Exceptions to [facility] (forward head) Thoracic Assessment Thoracic Assessment: Exceptions to [facility] (rounded shoulders) Lumbar Assessment Lumbar Assessment: Exceptions to [facility] (posterior pelvic tilt) Postural Control Postural Control: Within Functional Limits  Balance Balance Balance Assessed: Yes Standardized Balance Assessment Standardized Balance Assessment: Timed Up and Go Test Berg Balance Test Sit to Stand: Able to stand without using hands and stabilize independently Standing Unsupported: Able to stand safely 2 minutes Sitting with Back Unsupported but Feet Supported on Floor or Stool: Able to sit safely and securely 2 minutes Stand to Sit: Sits safely with minimal use of hands Transfers: Able to transfer safely, minor use of hands Standing Unsupported with Eyes Closed: Able to stand 10 seconds safely Standing Ubsupported with Feet Together:  Able to place feet together independently and stand 1 minute safely From Standing, Reach Forward with Outstretched Arm: Can reach forward >12 cm safely (5") From Standing Position, Pick up Object from Floor: Able to pick up shoe safely and easily From Standing Position, Turn to Look Behind Over each Shoulder: Looks behind from both sides and weight shifts well Turn 360 Degrees: Able to turn 360 degrees safely in 4 seconds or  less Standing Unsupported, Alternately Place Feet on Step/Stool: Needs assistance to keep from falling or unable to try Standing Unsupported, One Foot in Front: Able to take small step independently and hold 30 seconds Standing on One Leg: Unable to try or needs assist to prevent fall Total Score: 45/56 Timed Up and Go Test TUG: Normal TUG Normal TUG (seconds): 13.3 (AVERAGE of 3 trials) Extremity Assessment  RLE Assessment RLE Assessment: Within Functional Limits General Strength Comments: Ankle DF 5/5, knee ext 5/5, hip flex 4/5 LLE Assessment LLE Assessment: Within Functional Limits General Strength Comments: Ankle DF 5/5, knee ext 4+/5, hip flex 4-/5    [name] P [name] , PT, DPT 01/04/2021, 2:12 PM

## 2021-01-03 MED ORDER — LISINOPRIL 5 MG PO TABS
5.0000 mg | ORAL_TABLET | Freq: Every day | ORAL | Status: DC
  Administered 2021-01-03 – 2021-01-05 (×3): 5 mg via ORAL
  Filled 2021-01-03 (×3): qty 1
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
## 2021-01-03 NOTE — Progress Notes (Signed)
Speech Language Pathology Daily Session Note  Patient Details  Name: Zariel Croan MRN: [id number] Date of Birth: 08-03-1945  Today's Date: 01/03/2021 SLP Individual Time: 1350-1415 SLP Individual Time Calculation (min): 25 min  Short Term Goals: Week 1: SLP Short Term Goal 1 (Week 1): STG=LTG due to short ELOS  Skilled Therapeutic Interventions:  Pt was seen for skilled ST targeting cognitive goals.  SLP facilitated the session with a novel scheduling/deductive reasoning puzzle.  Pt was able to organize 7 out of 8 targeted tasks into a schedule according to written clues which improved to 8 out of 8 accuracy with min assist verbal cues to recognize and correct 1 error.  Pt was left in bed with bed alarm set and call bell within reach.  Continue per current plan of care.    Pain Pain Assessment Pain Scale: 0-10 Pain Score: 0-No pain    Therapy/Group: Individual Therapy  Rubel Heckard, Selinda Orion 01/03/2021, 3:30 PM

## 2021-01-03 NOTE — Progress Notes (Signed)
Pt c/o burning at IV site with med administration. Nurse assessed, IV infiltrated. IV removed, tip intact. Warn compress applied. Extremity elevated. Nurse attempted new IV x 1. Unsuccessful, IV team consulted

## 2021-01-04 ENCOUNTER — Ambulatory Visit: Payer: PPO | Admitting: Family Medicine

## 2021-01-04 LAB — BASIC METABOLIC PANEL
Anion gap: 7 (ref 5–15)
BUN: 27 mg/dL — ABNORMAL HIGH (ref 8–23)
CO2: 20 mmol/L — ABNORMAL LOW (ref 22–32)
Calcium: 9.1 mg/dL (ref 8.9–10.3)
Chloride: 111 mmol/L (ref 98–111)
Creatinine, Ser: 1.45 mg/dL — ABNORMAL HIGH (ref 0.44–1.00)
GFR, Estimated: 37 mL/min — ABNORMAL LOW (ref >60)
Glucose, Bld: 141 mg/dL — ABNORMAL HIGH (ref 70–99)
Potassium: 4.1 mmol/L (ref 3.5–5.1)
Sodium: 138 mmol/L (ref 135–145)

## 2021-01-04 LAB — CBC
HCT: 30.7 % — ABNORMAL LOW (ref 36.0–46.0)
Hemoglobin: 9.9 g/dL — ABNORMAL LOW (ref 12.0–15.0)
MCH: 26.5 pg (ref 26.0–34.0)
MCHC: 32.2 g/dL (ref 30.0–36.0)
MCV: 82.1 fL (ref 80.0–100.0)
Platelets: 284 10*3/uL (ref 150–400)
RBC: 3.74 MIL/uL — ABNORMAL LOW (ref 3.87–5.11)
RDW: 14.5 % (ref 11.5–15.5)
WBC: 7.1 10*3/uL (ref 4.0–10.5)
nRBC: 0 % (ref 0.0–0.2)
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
## 2021-01-04 NOTE — Progress Notes (Signed)
Occupational Therapy Session Note  Patient Details  Name: Lauren Craig MRN: [id number] Date of Birth: [birth year]  Today's Date: 01/04/2021 OT Individual Time: 1030-1058 OT Individual Time Calculation (min): 28 min    Short Term Goals: Week 1:  OT Short Term Goal 1 (Week 1): SRG=LTG d/t ELOS   Skilled Therapeutic Interventions/Progress Updates:    Pt greeted at time of session supine in bed stating she was not feeling well, needed IV hooked up and that RN aware, and NT present as well. Pt agreeable to toilet prior to RN hooking up IV, supine > sit with log roll CGA and ambulated HHA bed > toilet > sink > bed and transfer to bed same manner. CGA 3/3 toileting tasks and cues to prevent twisting with hygiene. Back in bed, positioned in partial side lying for back relief with pillows for support, pt saying this was a comfortable position. Alarm on call bell in reach.  Therapy Documentation Precautions:  Precautions Precautions: Fall, Other (comment) Precaution Comments: monitor body mechanics Restrictions Weight Bearing Restrictions: No     Therapy/Group: Individual Therapy  Viona Gilmore 01/04/2021, 7:21 AM
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
## 2021-01-04 NOTE — Plan of Care (Signed)
  Problem: RH Swallowing Goal: LTG Patient will consume least restrictive diet using compensatory strategies with assistance (SLP) Description: LTG:  Patient will consume least restrictive diet using compensatory strategies with assistance (SLP) Outcome: Completed/Met Goal: LTG Patient will participate in dysphagia therapy to increase swallow function with assistance (SLP) Description: LTG:  Patient will participate in dysphagia therapy to increase swallow function with assistance (SLP) Outcome: Completed/Met Goal: LTG Pt will demonstrate functional change in swallow as evidenced by bedside/clinical objective assessment (SLP) Description: LTG: Patient will demonstrate functional change in swallow as evidenced by bedside/clinical objective assessment (SLP) Outcome: Completed/Met   Problem: RH Expression Communication Goal: LTG Patient will increase speech intelligibility (SLP) Description: LTG: Patient will increase speech intelligibility at word/phrase/conversation level with cues, % of the time (SLP) Outcome: Completed/Met   Problem: RH Problem Solving Goal: LTG Patient will demonstrate problem solving for (SLP) Description: LTG:  Patient will demonstrate problem solving for basic/complex daily situations with cues  (SLP) Outcome: Completed/Met   Problem: RH Memory Goal: LTG Patient will demonstrate ability for day to day (SLP) Description: LTG:   Patient will demonstrate ability for day to day recall/carryover during cognitive/linguistic activities with assist  (SLP) Outcome: Completed/Met   Problem: RH Awareness Goal: LTG: Patient will demonstrate awareness during functional activites type of (SLP) Description: LTG: Patient will demonstrate awareness during functional activites type of (SLP) Outcome: Completed/Met

## 2021-01-04 NOTE — Progress Notes (Signed)
Patient ID: Lauren Craig, female   DOB: [birth year], 75 y.o.   MRN: [id number]  Patient OP referral faxed to Neuro OP.  [place], [place]

## 2021-01-04 NOTE — Progress Notes (Signed)
Inpatient Rehabilitation Care Coordinator Discharge Note  The overall goal for the admission was met for:   Discharge location: Yes, home   Length of Stay: Yes, 11 Days  Discharge activity level: Yes  Home/community participation: Yes  Services provided included: MD, RD, PT, OT, SLP, RN, CM, TR, Pharmacy, Neuropsych, and SW  Financial Services: Private Insurance: [employer]  Choices offered to/list presented to: patient  Follow-up services arranged: Outpatient: [facility]  Comments (or additional information): PT OT ST NO DME  Patient/Family verbalized understanding of follow-up arrangements: Yes  Individual responsible for coordination of the follow-up plan: patient, [phone]  Confirmed correct DME delivered: [name] 01/04/2021    [name]

## 2021-01-04 NOTE — Progress Notes (Signed)
Physical Therapy Session Note  Patient Details  Name: Lauren Craig MRN: [id number] Date of Birth: 1946-01-20  Today's Date: 01/04/2021 PT Individual Time: 1062-6948 PT Individual Time Calculation (min): 54 min   Short Term Goals: Week 1:  PT Short Term Goal 1 (Week 1): = to LTGs based on ELOS  Skilled Therapeutic Interventions/Progress Updates:    Pt sitting EOB to start session and agreeable to therapy. Pt with IV antibiotics running and this needed to be with [place] during therapy session. No reports of pain. Sit<>stand mod I to RW. Ambulated from her room to main rehab gym, ~159ft, mod I with RW - conversant throughout. Completed BERG balance test as outlined below:  Patient demonstrates increased fall risk as noted by score of  45/56 on Berg Balance Scale.  (<36= high risk for falls, close to 100%; 37-45 significant >80%; 46-51 moderate >50%; 52-55 lower >25%)  Completed TUG x3 trials: Trial 1: 15 seconds Trial 2: 13 seconds Trial 3: 12 seconds Avg = 13.3 seconds. Scores >13.5 seconds indicates increased falls risk.  Ambulated length of main rehab gym mod I with RW prior to completing stairs where she navigated up/down 12 (6inch) steps mod I with 2 hand rails - no cues needed for safety and demonstrated appropriate safety awareness with both ascent and descent. She ambulated the length of hallway again mod I with RW to mat table. After brief seated rest break, ambulated to ortho rehab gym mod I with RW to practice car transfers. She required supervision for completing car transfer as she tried to "abandon" RW while stepping closer to the car. Reminded and educated on importance of RW use for mobility and car transfers to reduce falls risk which she voiced understanding. Ambulated back to her room mod I with RW and elected to end session seated EOB. All needs met at end of session, bed alarm on.   Therapy Documentation Precautions:  Precautions Precautions: Fall, Other  (comment) Precaution Comments: monitor body mechanics Restrictions Weight Bearing Restrictions: No General:   Balance Balance Balance Assessed: Yes Standardized Balance Assessment Standardized Balance Assessment: Berg Balance Test Berg Balance Test Sit to Stand: Able to stand without using hands and stabilize independently Standing Unsupported: Able to stand safely 2 minutes Sitting with Back Unsupported but Feet Supported on Floor or Stool: Able to sit safely and securely 2 minutes Stand to Sit: Sits safely with minimal use of hands Transfers: Able to transfer safely, minor use of hands Standing Unsupported with Eyes Closed: Able to stand 10 seconds safely Standing Ubsupported with Feet Together: Able to place feet together independently and stand 1 minute safely From Standing, Reach Forward with Outstretched Arm: Can reach forward >12 cm safely (5") From Standing Position, Pick up Object from Floor: Able to pick up shoe safely and easily From Standing Position, Turn to Look Behind Over each Shoulder: Looks behind from both sides and weight shifts well Turn 360 Degrees: Able to turn 360 degrees safely in 4 seconds or less Standing Unsupported, Alternately Place Feet on Step/Stool: Needs assistance to keep from falling or unable to try Standing Unsupported, One Foot in Front: Able to take small step independently and hold 30 seconds Standing on One Leg: Unable to try or needs assist to prevent fall Total Score: 45/56   Therapy/Group: Individual Therapy  Codey Burling P Michaelpaul Apo PT 01/04/2021, 7:31 AM

## 2021-01-04 NOTE — Progress Notes (Signed)
Occupational Therapy Discharge Summary  Patient Details  Name: Lauren Craig MRN: [id number] Date of Birth: 1945/07/07  Today's Date: 01/04/2021 OT Individual Time: 0802-0904 OT Individual Time Calculation (min): 62 min   Session Note:  Pt sitting EOB to start with IV team in to change IV secondary to infiltration and nursing in for am medication administration.  She was able to complete donning her pants at modified independent level sitting EOB with RW in front for support.  She then ambulated to the sink where she stood to brush her teeth at the same modified independent level.  She ambulated down to the ortho gym where she engaged in use of the BITs to further assess visual scanning as well as reaction time.  She was able to complete Visual Scanning Program in an average of 2.3 seconds with 88% accuracy.  Progressed to cognitive Memory Sequence task and se was able to recall 5 word sequence but was unsuccessful with 6 word sequence on the first two attempts.  She was finally able to complete 6 word recall on the 3rd attempt within 4 min time limit parameter.  Finished session with ambulation back to the room at modified independent level and pt left sitting on the EOB with the call button and phone in reach and safety alarm in place.    Patient has met 11 of 11 long term goals due to improved activity tolerance, improved balance, postural control, ability to compensate for deficits, and improved coordination.  Patient to discharge at overall Modified Independent level.  Patient's care partner is independent to provide the necessary physical and cognitive assistance at discharge.    Reasons goals not met: NA  Recommendation:  Patient will benefit from ongoing skilled OT services in outpatient setting to continue to advance functional skills in the area of BADL, iADL, and Reduce care partner burden.  Pt has made greater progress with OT to an overall modified independent level with supervision for  bathing tasks.  Feel she will benefit from outpatient OT eval for further progression back to independent level for selfcare tasks.    Equipment: No equipment provided  Reasons for discharge: treatment goals met and discharge from hospital  Patient/family agrees with progress made and goals achieved: Yes  OT Discharge Precautions/Restrictions  Precautions Precautions: Fall Restrictions Weight Bearing Restrictions: No Pain  See Pain Flowsheet for details  ADL ADL Eating: Independent Where Assessed-Eating: Edge of bed Grooming: Modified independent Where Assessed-Grooming: Standing at sink Upper Body Bathing: Setup Where Assessed-Upper Body Bathing: Multimedia programmer, Chair Lower Body Bathing: Setup Where Assessed-Lower Body Bathing: Chair, Shower Upper Body Dressing: Independent Where Assessed-Upper Body Dressing: Edge of bed Lower Body Dressing: Modified independent Where Assessed-Lower Body Dressing: Edge of bed Toileting: Modified independent Where Assessed-Toileting: Bedside Commode Toilet Transfer: Modified independent Armed forces technical officer Method: Counselling psychologist: Geophysical data processor: Modified independent Social research officer, government Method: Heritage manager: Civil engineer, contracting with back Vision Baseline Vision/History: Wears glasses Wears Glasses: At all times Patient Visual Report: No change from baseline Vision Assessment?: No apparent visual deficits Perception  Perception: Within Functional Limits Praxis Praxis: Intact Cognition Overall Cognitive Status: History of cognitive impairments - at baseline Arousal/Alertness: Awake/alert Orientation Level: Oriented X4 Attention: Focused;Sustained;Selective Focused Attention: Appears intact Sustained Attention: Appears intact Selective Attention: Appears intact Memory: Impaired Memory Impairment: Decreased short term memory Reasoning: Appears intact Safety/Judgment: Appears  intact Sensation Sensation Light Touch: Appears Intact Hot/Cold: Appears Intact Proprioception: Appears Intact Stereognosis: Appears Intact Coordination Gross Motor Movements  are Fluid and Coordinated: Yes Fine Motor Movements are Fluid and Coordinated: Yes Motor  Motor Motor: Within Functional Limits Motor - Discharge Observations: General deconditioning impacting mobility and balance - significant improvement since date of evaluatoin Mobility  Transfers Sit to Stand: Independent with assistive device Stand to Sit: Independent with assistive device  Trunk/Postural Assessment  Cervical Assessment Cervical Assessment: Exceptions to [facility] Thoracic Assessment Thoracic Assessment: Exceptions to [facility] Lumbar Assessment Lumbar Assessment: Exceptions to [facility] Postural Control Postural Control: Within Functional Limits  Balance Balance Balance Assessed: Yes Static Sitting Balance Static Sitting - Balance Support: Feet supported;Bilateral upper extremity supported Static Sitting - Level of Assistance: 7: Independent Dynamic Sitting Balance Dynamic Sitting - Level of Assistance: 6: Modified independent (Device/Increase time) Static Standing Balance Static Standing - Balance Support: During functional activity Static Standing - Level of Assistance: 6: Modified independent (Device/Increase time) Dynamic Standing Balance Dynamic Standing - Balance Support: During functional activity;Bilateral upper extremity supported Dynamic Standing - Level of Assistance: 6: Modified independent (Device/Increase time) Extremity/Trunk Assessment RUE Assessment RUE Assessment: Within Functional Limits General Strength Comments: Strength 4+/5 throughout but pt reporting increased supra scapular pain as well as pain at the medial border.  Does not affect AROM however. LUE Assessment LUE Assessment: Within Functional Limits General Strength Comments: strength 5/5 throughout   Kameah Rawl  OTR/L 01/04/2021, 4:56 PM

## 2021-01-05 ENCOUNTER — Inpatient Hospital Stay (HOSPITAL_COMMUNITY): Payer: PPO
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
## 2021-01-05 MED ORDER — LISINOPRIL 5 MG PO TABS: 5.0000 mg | ORAL_TABLET | Freq: Every day | ORAL | 0 refills | Status: DC

## 2021-01-05 MED ORDER — LIDOCAINE 5 % EX PTCH: 1.0000 | MEDICATED_PATCH | Freq: Two times a day (BID) | CUTANEOUS | 0 refills | Status: DC | PRN

## 2021-01-05 MED ORDER — SERTRALINE HCL 25 MG PO TABS: 25.0000 mg | ORAL_TABLET | Freq: Every day | ORAL | 0 refills | Status: DC

## 2021-01-05 MED ORDER — DOXYCYCLINE HYCLATE 100 MG PO TABS
100.0000 mg | ORAL_TABLET | Freq: Two times a day (BID) | ORAL | Status: DC
  Administered 2021-01-05: 100 mg via ORAL
  Filled 2021-01-05: qty 1

## 2021-01-05 MED ORDER — PANTOPRAZOLE SODIUM 40 MG PO TBEC: 40.0000 mg | DELAYED_RELEASE_TABLET | Freq: Two times a day (BID) | ORAL | 1 refills | Status: DC

## 2021-01-05 MED ORDER — ALBUTEROL SULFATE HFA 108 (90 BASE) MCG/ACT IN AERS
2.0000 | INHALATION_SPRAY | Freq: Four times a day (QID) | RESPIRATORY_TRACT | 3 refills | Status: DC | PRN
Start: 2021-01-05 — End: 2021-01-29

## 2021-01-05 MED ORDER — ATORVASTATIN CALCIUM 80 MG PO TABS: 80.0000 mg | ORAL_TABLET | Freq: Every day | ORAL | 0 refills | Status: DC

## 2021-01-05 MED ORDER — OXYCODONE HCL 5 MG PO TABS: 2.5000 mg | ORAL_TABLET | ORAL | 0 refills | Status: DC | PRN

## 2021-01-05 MED ORDER — FAMOTIDINE 10 MG PO TABS: 10.0000 mg | ORAL_TABLET | Freq: Every day | ORAL | 3 refills | Status: DC

## 2021-01-05 MED ORDER — SUCRALFATE 1 G PO TABS: 1.0000 g | ORAL_TABLET | Freq: Every day | ORAL | 0 refills | Status: DC

## 2021-01-05 MED ORDER — CLOPIDOGREL BISULFATE 75 MG PO TABS: 75.0000 mg | ORAL_TABLET | Freq: Every day | ORAL | 0 refills | Status: DC

## 2021-01-05 MED ORDER — DOXYCYCLINE HYCLATE 100 MG PO TABS: 100.0000 mg | ORAL_TABLET | Freq: Two times a day (BID) | ORAL | 0 refills | Status: DC

## 2021-01-05 MED ORDER — CARVEDILOL 6.25 MG PO TABS: 6.2500 mg | ORAL_TABLET | Freq: Two times a day (BID) | ORAL | 3 refills | Status: DC

## 2021-01-05 MED ORDER — AMLODIPINE BESYLATE 10 MG PO TABS: 10.0000 mg | ORAL_TABLET | Freq: Every day | ORAL | 3 refills | Status: DC

## 2021-01-05 MED ORDER — INDAPAMIDE 2.5 MG PO TABS: 5.0000 mg | ORAL_TABLET | Freq: Every day | ORAL | 0 refills | Status: DC
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
## 2021-01-06 ENCOUNTER — Encounter: Payer: Self-pay | Admitting: *Deleted

## 2021-01-18 ENCOUNTER — Telehealth: Payer: Self-pay

## 2021-01-18 NOTE — Telephone Encounter (Signed)
Attempted to call Manuela Schwartz. Left generic voicemail  Should NOT be taking clopidogrel (stopped after 21 days). Should continue Aspirin.   Should be taking carvedilol 6.25 mg BID.  Let me know if questions---please try to call her again.  Dorris Singh, MD  Family Medicine Teaching Service

## 2021-01-18 NOTE — Telephone Encounter (Signed)
Manuela Schwartz [facility] RN calls nurse line reporting medication management concerns. Manuela Schwartz reports patient is not taking Plavix or Carvedilol. Patient reported to her those two medications were discontinued at rehab facility. Patient has a hospital FU on 9/2 with PCP.   Manuela Schwartz would like for PCP to call her to clear up any confusion.   Please advise.

## 2021-01-19 NOTE — Telephone Encounter (Signed)
Lauren Craig returns call to nurse line. Lauren Craig reports the patients discharge summary from rehab has conflicting information. Discharge summary states she should not be taking aspirin and should be continuing to take plavix. My thought was to schedule her for a pharmacy visit for med rec, however she has transportation concerns and lives in the [place] area.   Please call Lauren Craig to discuss.

## 2021-01-19 NOTE — Telephone Encounter (Signed)
Lauren Craig is correct---discrepant information. Discussed medications. She is to take clopidogrel daily, no aspirin. Carvedilol should be taken. Discussed Coreg.   Dorris Singh, MD  Family Medicine Teaching Service

## 2021-01-25 ENCOUNTER — Encounter: Payer: PPO | Admitting: Registered Nurse

## 2021-01-28 ENCOUNTER — Telehealth: Payer: Self-pay | Admitting: Family Medicine

## 2021-01-28 NOTE — Telephone Encounter (Signed)
Called patients daughter per request as she is not coming in with patient tomorrow. Requests patient be considered for in home services. Will also discuss Advance Directives and Geriatric clinic referral with patient.   All questions answered and will discuss above with patient tomorrow.   Dorris Singh, MD  Family Medicine Teaching Service

## 2021-01-28 NOTE — Telephone Encounter (Signed)
Attempted to call patient's daughter about patient's condition--requested call before appointment. Unable to LVM. Will message.  Dorris Singh, MD  Family Medicine Teaching Service

## 2021-01-28 NOTE — Telephone Encounter (Signed)
   Laronda Cassarino DOB: February 17, 1946 MRN: [id number]   RIDER WAIVER AND RELEASE OF LIABILITY  For purposes of improving physical access to our facilities, [facility] is pleased to partner with third parties to provide [place] patients or other authorized individuals the option of convenient, on-demand ground transportation services (the [place]") through use of the technology service that enables users to request on-demand ground transportation from independent third-party providers.  By opting to use and accept these [employer], I, the undersigned, hereby agree on behalf of myself, and on behalf of any minor child using the Government social research officer for whom I am the parent or legal guardian, as follows:  Government social research officer provided to me are provided by independent third-party transportation providers who are not [employer] or employees and who are unaffiliated with [employer]. [facility] is neither a transportation carrier nor a common or public carrier. [facility] has no control over the quality or safety of the transportation that occurs as a result of the [employer]. [facility] cannot guarantee that any third-party transportation provider will complete any arranged transportation service. [facility] makes no representation, warranty, or guarantee regarding the reliability, timeliness, quality, safety, suitability, or availability of any of the Transport Services or that they will be error free. I fully understand that traveling by vehicle involves risks and dangers of serious bodily injury, including permanent disability, paralysis, and death. I agree, on behalf of myself and on behalf of any minor child using the Transport Services for whom I am the parent or legal guardian, that the entire risk arising out of my use of the [employer] remains solely with me, to the maximum extent permitted under applicable law. The [employer] are provided "as  is" and "as available." [facility] disclaims all representations and warranties, express, implied or statutory, not expressly set out in these terms, including the implied warranties of merchantability and fitness for a particular purpose. I hereby waive and release [facility], its agents, employees, officers, directors, representatives, insurers, attorneys, assigns, successors, subsidiaries, and affiliates from any and all past, present, or future claims, demands, liabilities, actions, causes of action, or suits of any kind directly or indirectly arising from acceptance and use of the [employer]. I further waive and release [facility] and its affiliates from all present and future liability and responsibility for any injury or death to persons or damages to property caused by or related to the use of the [employer]. I have read this Waiver and Release of Liability, and I understand the terms used in it and their legal significance. This Waiver is freely and voluntarily given with the understanding that my right (as well as the right of any minor child for whom I am the parent or legal guardian using the [employer]) to legal recourse against [facility] in connection with the [employer] is knowingly surrendered in return for use of these services.   I attest that I read the consent document to Cathleen Fears, gave Ms. Bafford the opportunity to ask questions and answered the questions asked (if any). I affirm that Cathleen Fears then provided consent for she's participation in this program.     Legrand Pitts

## 2021-01-29 ENCOUNTER — Telehealth: Payer: Self-pay | Admitting: *Deleted

## 2021-01-29 ENCOUNTER — Ambulatory Visit (INDEPENDENT_AMBULATORY_CARE_PROVIDER_SITE_OTHER): Payer: PPO

## 2021-01-29 ENCOUNTER — Other Ambulatory Visit: Payer: Self-pay

## 2021-01-29 ENCOUNTER — Encounter: Payer: Self-pay | Admitting: Family Medicine

## 2021-01-29 ENCOUNTER — Ambulatory Visit (INDEPENDENT_AMBULATORY_CARE_PROVIDER_SITE_OTHER): Payer: PPO | Admitting: Family Medicine

## 2021-01-29 VITALS — BP 179/59 | HR 66 | Ht 65.0 in | Wt 157.8 lb

## 2021-01-29 DIAGNOSIS — Z23 Encounter for immunization: Secondary | ICD-10-CM

## 2021-01-29 DIAGNOSIS — I159 Secondary hypertension, unspecified: Secondary | ICD-10-CM

## 2021-01-29 DIAGNOSIS — E118 Type 2 diabetes mellitus with unspecified complications: Secondary | ICD-10-CM

## 2021-01-29 DIAGNOSIS — I10 Essential (primary) hypertension: Secondary | ICD-10-CM

## 2021-01-29 DIAGNOSIS — D649 Anemia, unspecified: Secondary | ICD-10-CM

## 2021-01-29 DIAGNOSIS — R112 Nausea with vomiting, unspecified: Secondary | ICD-10-CM

## 2021-01-29 DIAGNOSIS — I679 Cerebrovascular disease, unspecified: Secondary | ICD-10-CM | POA: Diagnosis not present

## 2021-01-29 DIAGNOSIS — S2221XD Fracture of manubrium, subsequent encounter for fracture with routine healing: Secondary | ICD-10-CM | POA: Diagnosis not present

## 2021-01-29 LAB — POCT GLYCOSYLATED HEMOGLOBIN (HGB A1C): HbA1c, POC (controlled diabetic range): 6.6 % (ref 0.0–7.0)

## 2021-01-29 MED ORDER — OXYCODONE HCL 5 MG PO TABS
5.0000 mg | ORAL_TABLET | Freq: Two times a day (BID) | ORAL | 0 refills | Status: DC | PRN
Start: 2021-01-29 — End: 2021-03-18

## 2021-01-29 MED ORDER — ATORVASTATIN CALCIUM 80 MG PO TABS: 80.0000 mg | ORAL_TABLET | Freq: Every day | ORAL | 3 refills | Status: DC

## 2021-01-29 MED ORDER — CARVEDILOL 6.25 MG PO TABS: 6.2500 mg | ORAL_TABLET | Freq: Two times a day (BID) | ORAL | 3 refills | Status: DC

## 2021-01-29 MED ORDER — SERTRALINE HCL 25 MG PO TABS: 25.0000 mg | ORAL_TABLET | Freq: Every day | ORAL | 3 refills | Status: DC

## 2021-01-29 MED ORDER — LISINOPRIL 5 MG PO TABS: 5.0000 mg | ORAL_TABLET | Freq: Every day | ORAL | 3 refills | Status: DC

## 2021-01-29 MED ORDER — INDAPAMIDE 2.5 MG PO TABS: 5.0000 mg | ORAL_TABLET | Freq: Every day | ORAL | 3 refills | Status: DC

## 2021-01-29 MED ORDER — EZETIMIBE 10 MG PO TABS: 10.0000 mg | ORAL_TABLET | Freq: Every day | ORAL | 3 refills | Status: DC

## 2021-01-29 MED ORDER — FAMOTIDINE 10 MG PO TABS: 10.0000 mg | ORAL_TABLET | Freq: Every day | ORAL | 3 refills | Status: DC

## 2021-01-29 MED ORDER — PANTOPRAZOLE SODIUM 40 MG PO TBEC: 40.0000 mg | DELAYED_RELEASE_TABLET | Freq: Two times a day (BID) | ORAL | 3 refills | Status: DC

## 2021-01-29 MED ORDER — CLOPIDOGREL BISULFATE 75 MG PO TABS: 75.0000 mg | ORAL_TABLET | Freq: Every day | ORAL | 3 refills | Status: DC

## 2021-01-29 MED ORDER — SUCRALFATE 1 G PO TABS: 1.0000 g | ORAL_TABLET | Freq: Every day | ORAL | 3 refills | Status: DC

## 2021-01-29 MED ORDER — AMLODIPINE BESYLATE 10 MG PO TABS: 10.0000 mg | ORAL_TABLET | Freq: Every day | ORAL | 3 refills | Status: DC

## 2021-01-29 MED ORDER — ALBUTEROL SULFATE HFA 108 (90 BASE) MCG/ACT IN AERS: 2.0000 | INHALATION_SPRAY | Freq: Four times a day (QID) | RESPIRATORY_TRACT | 3 refills | Status: DC | PRN

## 2021-01-29 NOTE — Assessment & Plan Note (Signed)
The patient has had multiple strokes.  Her blood pressure remains uncontrolled.  However I am limited in increasing her blood pressure medication given her diastolic number of 59.  She is very stressed today and under considerable emotional stress.  We will have her repeat at home and consider increasing her lisinopril in the future.  Also consider adding SGLT2 inhibitor in the future given her risk factors for heart failure.

## 2021-01-29 NOTE — Patient Instructions (Addendum)
It was wonderful to see you today.  Please bring ALL of your medications with you to every visit.   Today we talked about:  - Wrapping your leg each day with fresh gauze and a wrap  - Going to the pharmacy to get your medications   --I will call you with blood work results   -- You will be called by Neurology Dr. Bonney Aid office Our case manager and social worker   Thank you for choosing [facility].   Please call [phone] with any questions about today's appointment.  Please be sure to schedule follow up at the front  desk before you leave today.   Dorris Singh, MD  Family Medicine

## 2021-01-29 NOTE — Assessment & Plan Note (Signed)
A1c rising slightly today, may need to consider restarting a low-dose of metformin although she has had prior intolerance to this.  Could also consider an SGLT2 inhibitor which she has not tried in the past.
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
## 2021-01-29 NOTE — Assessment & Plan Note (Signed)
Normocytic with a normal ferritin, we will obtain TIBC and sat today.  Suspect this is in part due to her chronic disease and possibly poor nutrition.  She did have a prolonged period of time where she was not eating well due to persistent gastrointestinal symptoms which is since resolved.

## 2021-01-29 NOTE — Chronic Care Management (AMB) (Signed)
  Care Management   Outreach Note  01/29/2021 Name: Lauren Craig MRN: [id number] DOB: 28-Aug-1945  Referred by: Martyn Malay, MD Reason for referral : Care Coordination (Initial outreach to schedule referral with [facility] and Licensed Clinical SW )   An unsuccessful telephone outreach was attempted today. The patient was referred to the case management team for assistance with care management and care coordination.   Follow Up Plan:  The care management team will reach out to the patient again over the next 7 days. If patient returns call to provider office, please advise to call [place]  at [phone].  [facility] Management  Direct Dial: [phone]

## 2021-01-29 NOTE — Assessment & Plan Note (Signed)
This is not at goal.  Medications were refilled for 90-day supply today and sent to her pharmacy.

## 2021-01-30 LAB — BASIC METABOLIC PANEL
BUN/Creatinine Ratio: 9 — ABNORMAL LOW (ref 12–28)
BUN: 11 mg/dL (ref 8–27)
CO2: 24 mmol/L (ref 20–29)
Calcium: 9.3 mg/dL (ref 8.7–10.3)
Chloride: 101 mmol/L (ref 96–106)
Creatinine, Ser: 1.18 mg/dL — ABNORMAL HIGH (ref 0.57–1.00)
Glucose: 121 mg/dL — ABNORMAL HIGH (ref 65–99)
Potassium: 4.1 mmol/L (ref 3.5–5.2)
Sodium: 138 mmol/L (ref 134–144)
eGFR: 48 mL/min/{1.73_m2} — ABNORMAL LOW (ref >59)

## 2021-01-30 LAB — IRON AND TIBC
Iron Saturation: 16 % (ref 15–55)
Iron: 40 ug/dL (ref 27–139)
Total Iron Binding Capacity: 253 ug/dL (ref 250–450)
UIBC: 213 ug/dL (ref 118–369)

## 2021-01-30 LAB — CBC
Hematocrit: 35.7 % (ref 34.0–46.6)
Hemoglobin: 11.5 g/dL (ref 11.1–15.9)
MCH: 25.7 pg — ABNORMAL LOW (ref 26.6–33.0)
MCHC: 32.2 g/dL (ref 31.5–35.7)
MCV: 80 fL (ref 79–97)
Platelets: 241 10*3/uL (ref 150–450)
RBC: 4.47 x10E6/uL (ref 3.77–5.28)
RDW: 14.2 % (ref 11.7–15.4)
WBC: 6.6 10*3/uL (ref 3.4–10.8)

## 2021-01-30 LAB — LDL CHOLESTEROL, DIRECT: LDL Direct: 175 mg/dL — ABNORMAL HIGH (ref 0–99)

## 2021-01-30 LAB — VITAMIN B12: Vitamin B-12: 478 pg/mL (ref 232–1245)

## 2021-02-02 ENCOUNTER — Telehealth: Payer: Self-pay

## 2021-02-02 ENCOUNTER — Telehealth: Payer: Self-pay | Admitting: Family Medicine

## 2021-02-02 DIAGNOSIS — I679 Cerebrovascular disease, unspecified: Secondary | ICD-10-CM

## 2021-02-02 MED ORDER — EZETIMIBE 10 MG PO TABS
10.0000 mg | ORAL_TABLET | Freq: Every day | ORAL | 3 refills | Status: DC
Start: 2021-02-02 — End: 2021-03-19

## 2021-02-02 NOTE — Telephone Encounter (Signed)
Called patient with results.  She had a good weekend she is talking with her daughter and overall doing okay.  We reviewed her results which overall demonstrate improvement in her creatinine and normalization of her CBC.  Her iron saturation is lower and her B12 is normal.  Iron saturation is 16% and her ferritin in June was 220.  Suspect component of both mixed iron deficiency anemia in the setting of her year-long history of duodenitis and gastritis as well as anemia of chronic disease in the setting of her kidney disease and underlying comorbid medical conditions.  Follow-up detailed as below   Reduced iron saturation: Has follow up with Gastroenterology, will monitor, she is due for colonoscopy and follows with Dr. Henrene Pastor.  Will discuss again at follow-up.  Elevated LDL: Discussed, started zetia, repeat direct in 4-6 weeks.   All questions answered.  I also provided numbers to neurology and physical medicine and rehabilitation.  She is scheduled for follow-up.

## 2021-02-02 NOTE — Chronic Care Management (AMB) (Signed)
  Care Management   Outreach Note  02/02/2021 Name: Lauren Craig MRN: [id number] DOB: Mar 17, 1946  Referred by: Martyn Malay, MD Reason for referral : Care Coordination (Initial outreach to schedule referral with [facility] and Licensed Clinical SW )   A second unsuccessful telephone outreach was attempted today. The patient was referred to the case management team for assistance with care management and care coordination.   Follow Up Plan:  The care management team will reach out to the patient again over the next 7 days.  If patient returns call to provider office, please advise to call [place]* at [phone].*  [facility] Management  Direct Dial: [phone]

## 2021-02-02 NOTE — Telephone Encounter (Signed)
Unable to LVM to schedule pt for AWV

## 2021-02-05 NOTE — Chronic Care Management (AMB) (Signed)
  Care Management   Note  02/05/2021 Name: Lauren Craig MRN: [id number] DOB: 02/09/1946  Lauren Craig is a 75 y.o. year old female who is a primary care patient of Martyn Malay, MD. I reached out to Cathleen Fears by phone today in response to a referral sent by Lauren Craig PCP,  Martyn Malay, MD.    Lauren Craig was given information about care management services today including:  Care management services include personalized support from designated clinical staff supervised by her physician, including individualized plan of care and coordination with other care providers 24/7 contact phone numbers for assistance for urgent and routine care needs. The patient may stop care management services at any time by phone call to the office staff.  Patient agreed to services and verbal consent obtained.   Follow up plan: Telephone appointment with care management team member scheduled for: RNCM on 02/12/21 and SW 02/15/21  [facility] Management  Direct Dial: [phone]

## 2021-02-08 ENCOUNTER — Inpatient Hospital Stay: Payer: PPO | Admitting: Diagnostic Neuroimaging

## 2021-02-10 ENCOUNTER — Ambulatory Visit: Payer: PPO

## 2021-02-10 NOTE — Patient Instructions (Signed)
Ms. Korman  it was nice speaking with you. Please call me directly [phone] if you have questions about the goals we discussed.   Goals Addressed             This Visit's Progress    Track and Manage My Blood Pressure-Hypertension       Timeframe:  Long-Range Goal Priority:  High Start Date:   02/10/21                          Expected End Date:    03/29/21                   Follow Up Date 02/24/21    - check blood pressure daily - choose a place to take my blood pressure (home, clinic or office, retail store) - write blood pressure results in a log or diary    Why is this important?   You won't feel high blood pressure, but it can still hurt your blood vessels.  High blood pressure can cause heart or kidney problems. It can also cause a stroke.  Making lifestyle changes like losing a little weight or eating less salt will help.  Checking your blood pressure at home and at different times of the day can help to control blood pressure.  If the doctor prescribes medicine remember to take it the way the doctor ordered.  Call the office if you cannot afford the medicine or if there are questions about it.     Notes:         Patient Care Plan: RN Care Manager     Problem Identified: Hypertension      Goal: The patient wil monitor and maintain her BP within given range   Start Date: 02/10/2021  Expected End Date: 03/29/2021  Priority: High  Note:   Current Barriers:  Knowledge Deficits related to plan of care for management of HTN  Chronic Disease Management support and education needs related to HTN  RNCM Clinical Goal(s):  Patient will verbalize understanding of plan for management of HTN through collaboration with RN Care manager, provider, and care team.   Interventions: 1:1 collaboration with primary care provider regarding development and update of comprehensive plan of care as evidenced by provider attestation and co-signature Inter-disciplinary care team  collaboration (see longitudinal plan of care) Evaluation of current treatment plan related to  self management and patient's adherence to plan as established by provider   Hypertension: (Status: New goal.) Last practice recorded BP readings:  BP Readings from Last 3 Encounters:  01/29/21 (!) 179/59  01/05/21 (!) 159/54  12/25/20 (!) 183/59  Most recent eGFR/CrCl:  Lab Results  Component Value Date   EGFR 48 (L) 01/29/2021    No components found for: CRCL  Evaluation of current treatment plan related to hypertension self management and patient's adherence to plan as established by provider;   Provided education to patient re: stroke prevention, s/s of heart attack and stroke; Reviewed prescribed diet low sodium Reviewed medications with patient and discussed importance of compliance;  Patient denies any chest pain shortness of breath but has some swelling in her feet.  Advised the patient to elevate her feet when resting Provided assistance with obtaining home blood pressure monitor via discussed with the patient about calling her insurance to see if they had a catalog that she could order a bp monitor if not get back with me and we can see about getting her  one.;  Discussed plans with patient for ongoing care management follow up and provided patient with direct contact information for care management team; Advised patient, providing education and rationale, to monitor blood pressure daily and record, calling PCP for findings outside established parameters;  Reviewed scheduled/upcoming provider appointments including: Social Worker 9/19 , Dr Owens Shark 10/3,  Will send the patient a Calendar and HTN booklet.  Patient Goals/Self-Care Activities: Patient will self administer medications as prescribed Patient will attend all scheduled provider appointments Patient will call pharmacy for medication refills Patient will continue to perform ADL's independently Patient will continue to perform  IADL's independently Patient will call provider office for new concerns or questions        Ms. Ament received Care Management services today:  Care Management services include personalized support from designated clinical staff supervised by her physician, including individualized plan of care and coordination with other care providers 24/7 contact [phone] for assistance for urgent and routine care needs. Care Management are voluntary services and be declined at any time by calling the office. The patient verbalizes understanding of the information and instructions discussed today.  Our next appointment is scheduled for  02/24/21.   Please feel free to call me or the office if you have any questions or concerns.  Lazaro Arms RN, BSN, [facility] Care Management Coordinator [place] Phone: [phone] I Fax: [phone]

## 2021-02-10 NOTE — Chronic Care Management (AMB) (Signed)
Care Management    RN Visit Note  02/10/2021 Name: Lauren Craig MRN: [id number] DOB: 03-14-1946  Subjective: Lauren Craig is a 75 y.o. year old female who is a primary care patient of Lauren Malay, MD. The care management team was consulted for assistance with disease management and care coordination needs.    Engaged with patient by telephone for initial visit in response to provider referral for case management and/or care coordination services.   Consent to Services:   Lauren Craig was given information about Care Management services today including:  Care Management services includes personalized support from designated clinical staff supervised by her physician, including individualized plan of care and coordination with other care providers 24/7 contact phone numbers for assistance for urgent and routine care needs. The patient may stop case management services at any time by phone call to the office staff.  Patient agreed to services and consent obtained.    Assessment:  The patient  continues to experience difficulty with Elevated blood pressure.. See Care Plan below for interventions and patient self-care actives. Follow up Plan: Patient would like continued follow-up.  CCM RNCM will outreach the patient within the next 2 weeks.  Patient will call office if needed prior to next encounter  Review of patient past medical history, allergies, medications, health status, including review of consultants reports, laboratory and other test data, was performed as part of comprehensive evaluation and provision of chronic care management services.   SDOH (Social Determinants of Health) assessments and interventions performed:    Care Plan  Allergies  Allergen Reactions   Penicillins Hives, Rash and Other (See Comments)    Did it involve swelling of the face/tongue/throat, SOB, or low BP? Yes Did it involve sudden or severe rash/hives, skin peeling, or any reaction on the inside  of your mouth or nose? No Did you need to seek medical attention at a hospital or doctor's office? Yes When did it last happen? ? If all above answers are "NO", may proceed with cephalosporin use.    Insulin Lispro Itching and Other (See Comments)    Site of injection was inflamed, red, painful episode   Insulin Lispro Prot & Lispro Other (See Comments)    Site of injection was inflamed, red, painful episode   Semaglutide Nausea And Vomiting   Metformin Diarrhea    Outpatient Encounter Medications as of 02/10/2021  Medication Sig Note   acetaminophen (TYLENOL) 325 MG tablet Take 2 tablets (650 mg total) by mouth every 6 (six) hours as needed for headache.    albuterol (VENTOLIN HFA) 108 (90 Base) MCG/ACT inhaler Inhale 2 puffs into the lungs every 6 (six) hours as needed for wheezing or shortness of breath.    amLODipine (NORVASC) 10 MG tablet Take 1 tablet (10 mg total) by mouth daily.    atorvastatin (LIPITOR) 80 MG tablet Take 1 tablet (80 mg total) by mouth at bedtime.    carvedilol (COREG) 6.25 MG tablet Take 1 tablet (6.25 mg total) by mouth 2 (two) times daily with a meal.    clopidogrel (PLAVIX) 75 MG tablet Take 1 tablet (75 mg total) by mouth daily.    ezetimibe (ZETIA) 10 MG tablet Take 1 tablet (10 mg total) by mouth daily.    fluticasone (FLONASE) 50 MCG/ACT nasal spray Place 2 sprays into both nostrils daily.    indapamide (LOZOL) 2.5 MG tablet Take 2 tablets (5 mg total) by mouth daily.    lisinopril (ZESTRIL) 5 MG tablet Take  1 tablet (5 mg total) by mouth daily.    oxyCODONE (ROXICODONE) 5 MG immediate release tablet Take 1 tablet (5 mg total) by mouth 2 (two) times daily as needed for severe pain. 02/10/2021: Only takes at night if back is hurting    pantoprazole (PROTONIX) 40 MG tablet Take 1 tablet (40 mg total) by mouth 2 (two) times daily.    sertraline (ZOLOFT) 25 MG tablet Take 1 tablet (25 mg total) by mouth daily.    sucralfate (CARAFATE) 1 g tablet Take 1 tablet  (1 g total) by mouth daily before supper.    famotidine (PEPCID) 10 MG tablet Take 1 tablet (10 mg total) by mouth daily. (Patient not taking: Reported on 02/10/2021)    lidocaine (LIDODERM) 5 % Place 1 patch onto the skin 2 (two) times daily as needed. Remove & Discard patch within 12 hours or as directed by MD (Patient not taking: Reported on 02/10/2021)    No facility-administered encounter medications on file as of 02/10/2021.    Patient Active Problem List   Diagnosis Date Noted   Infarction of left basal ganglia ([place]) 12/25/2020   Fracture 12/18/2020   Renal cyst 12/17/2020   Anemia 11/19/2020   Peripheral edema 11/19/2020   Otalgia, bilateral 11/09/2020   Visual field defect 11/08/2020   Cerebrovascular disease 11/08/2020   HTN (hypertension) 10/03/2017   Hyperlipidemia 10/03/2017   Controlled diabetes mellitus type 2 with complications ([place]) 44/96/7591    Conditions to be addressed/monitored: HTN  Care Plan : RN Care Manager  Updates made by Lazaro Arms, RN since 02/10/2021 12:00 AM     Problem: Hypertension      Goal: The patient wil monitor and maintain her BP within given range   Start Date: 02/10/2021  Expected End Date: 03/29/2021  Priority: High  Note:   Current Barriers:  Knowledge Deficits related to plan of care for management of HTN  Chronic Disease Management support and education needs related to HTN  RNCM Clinical Goal(s):  Patient will verbalize understanding of plan for management of HTN through collaboration with RN Care manager, provider, and care team.   Interventions: 1:1 collaboration with primary care provider regarding development and update of comprehensive plan of care as evidenced by provider attestation and co-signature Inter-disciplinary care team collaboration (see longitudinal plan of care) Evaluation of current treatment plan related to  self management and patient's adherence to plan as established by provider   Hypertension:  (Status: New goal.) Last practice recorded BP readings:  BP Readings from Last 3 Encounters:  01/29/21 (!) 179/59  01/05/21 (!) 159/54  12/25/20 (!) 183/59  Most recent eGFR/CrCl:  Lab Results  Component Value Date   EGFR 48 (L) 01/29/2021    No components found for: CRCL  Evaluation of current treatment plan related to hypertension self management and patient's adherence to plan as established by provider;   Provided education to patient re: stroke prevention, s/s of heart attack and stroke; Reviewed prescribed diet low sodium Reviewed medications with patient and discussed importance of compliance;  Patient denies any chest pain shortness of breath but has some swelling in her feet.  Advised the patient to elevate her feet when resting Provided assistance with obtaining home blood pressure monitor via discussed with the patient about calling her insurance to see if they had a catalog that she could order a bp monitor if not get back with me and we can see about getting her one.;  Discussed plans with patient for ongoing  care management follow up and provided patient with direct contact information for care management team; Advised patient, providing education and rationale, to monitor blood pressure daily and record, calling PCP for findings outside established parameters;  Reviewed scheduled/upcoming provider appointments including: Social Worker 9/19 , Dr Owens Shark 10/3,  Will send the patient a Calendar and HTN booklet.  Patient Goals/Self-Care Activities: Patient will self administer medications as prescribed Patient will attend all scheduled provider appointments Patient will call pharmacy for medication refills Patient will continue to perform ADL's independently Patient will continue to perform IADL's independently Patient will call provider office for new concerns or questions       Lazaro Arms RN, BSN, [facility] Care Management Coordinator [place] Phone: [phone] I Fax: [phone]

## 2021-02-15 ENCOUNTER — Ambulatory Visit: Payer: PPO | Admitting: Licensed Clinical Social Worker

## 2021-02-15 DIAGNOSIS — F4329 Adjustment disorder with other symptoms: Secondary | ICD-10-CM

## 2021-02-15 DIAGNOSIS — F39 Unspecified mood [affective] disorder: Secondary | ICD-10-CM

## 2021-02-15 DIAGNOSIS — Z7189 Other specified counseling: Secondary | ICD-10-CM

## 2021-02-15 NOTE — Patient Instructions (Signed)
Licensed Clinical Social Worker Visit Information  Goals we discussed today:   Goals Addressed             This Visit's Progress    Coping Skills Enhanced       Patient Goals/Self-Care Activities: Over the next 30 days Call [facility] [phone] next week to schedule ride to appointment I have placed a referral to [facility] for counseling         Ms. Niswonger was given information about Care Management services today including:  Care Management services include personalized support from designated clinical staff supervised by her physician, including individualized plan of care and coordination with other care providers 24/7 contact phone numbers for assistance for urgent and routine care needs. The patient may stop Care Management services at any time by phone call to the office staff.  Patient agreed to services and verbal consent obtained.   Patient verbalizes understanding of instructions provided today and agrees to view in [place].   Follow up plan: SW will follow up with patient by phone over the next 30 days.   Casimer Lanius, [place] / [place]   [phone]

## 2021-02-15 NOTE — Chronic Care Management (AMB) (Signed)
Care Management Clinical Social Work Note  02/15/2021 Name: Lauren Craig MRN: [id number] DOB: 1946-01-12  Lauren Craig is a 75 y.o. year old female who is a primary care patient of Martyn Malay, MD.  The Care Management team was consulted for assistance with chronic disease management and coordination needs.  Engaged with patient by telephone for initial visit in response to provider referral for social work chronic care management and care coordination services  Consent to Services:  [name] was given information about Care Management services today including:  Care Management services includes personalized support from designated clinical staff supervised by her physician, including individualized plan of care and coordination with other care providers 24/7 contact phone numbers for assistance for urgent and routine care needs. The patient may stop case management services at any time by phone call to the office staff.  Patient agreed to services and consent obtained.   Assessment: Transportation Needs  and Mental Health Counseling and Resources.   Patient seems to experiencing symptoms of stress related to decline in her health, concerns about her daughter and lack of transportation  . See Care Plan below for interventions and patient self-care actives.  Recommendation: Patient may benefit from, and is in agreement for LCSW to make referral for counseling to assist her with finding her new normal.   Follow up Plan: Patient would like continued follow-up from CCM LCSW .  per patient's request will follow up in 30 days.  CCM RN will f/u with patient in 1 weeks.  Will call office if needed prior to next encounter.     : Review of patient past medical history, allergies, medications, and health status, including review of relevant consultants reports was performed today as part of a comprehensive evaluation and provision of chronic care management and care coordination  services.  SDOH (Social Determinants of Health) assessments and interventions performed:  SDOH Interventions    Flowsheet Row Most Recent Value  SDOH Interventions   Stress Interventions Provide Counseling  Transportation Interventions [facility]        Advanced Directives Status: Not addressed in this encounter.  Care Plan  Allergies  Allergen Reactions   Penicillins Hives, Rash and Other (See Comments)    Did it involve swelling of the face/tongue/throat, SOB, or low BP? Yes Did it involve sudden or severe rash/hives, skin peeling, or any reaction on the inside of your mouth or nose? No Did you need to seek medical attention at a hospital or doctor's office? Yes When did it last happen? ? If all above answers are "NO", may proceed with cephalosporin use.    Insulin Lispro Itching and Other (See Comments)    Site of injection was inflamed, red, painful episode   Insulin Lispro Prot & Lispro Other (See Comments)    Site of injection was inflamed, red, painful episode   Semaglutide Nausea And Vomiting   Metformin Diarrhea    Outpatient Encounter Medications as of 02/15/2021  Medication Sig Note   acetaminophen (TYLENOL) 325 MG tablet Take 2 tablets (650 mg total) by mouth every 6 (six) hours as needed for headache.    albuterol (VENTOLIN HFA) 108 (90 Base) MCG/ACT inhaler Inhale 2 puffs into the lungs every 6 (six) hours as needed for wheezing or shortness of breath.    amLODipine (NORVASC) 10 MG tablet Take 1 tablet (10 mg total) by mouth daily.    atorvastatin (LIPITOR) 80 MG tablet Take 1 tablet (80 mg total) by mouth at bedtime.  carvedilol (COREG) 6.25 MG tablet Take 1 tablet (6.25 mg total) by mouth 2 (two) times daily with a meal.    clopidogrel (PLAVIX) 75 MG tablet Take 1 tablet (75 mg total) by mouth daily.    ezetimibe (ZETIA) 10 MG tablet Take 1 tablet (10 mg total) by mouth daily.    famotidine (PEPCID) 10 MG tablet Take 1 tablet (10 mg total)  by mouth daily. (Patient not taking: Reported on 02/10/2021)    fluticasone (FLONASE) 50 MCG/ACT nasal spray Place 2 sprays into both nostrils daily.    indapamide (LOZOL) 2.5 MG tablet Take 2 tablets (5 mg total) by mouth daily.    lidocaine (LIDODERM) 5 % Place 1 patch onto the skin 2 (two) times daily as needed. Remove & Discard patch within 12 hours or as directed by MD (Patient not taking: Reported on 02/10/2021)    lisinopril (ZESTRIL) 5 MG tablet Take 1 tablet (5 mg total) by mouth daily.    oxyCODONE (ROXICODONE) 5 MG immediate release tablet Take 1 tablet (5 mg total) by mouth 2 (two) times daily as needed for severe pain. 02/10/2021: Only takes at night if back is hurting    pantoprazole (PROTONIX) 40 MG tablet Take 1 tablet (40 mg total) by mouth 2 (two) times daily.    sertraline (ZOLOFT) 25 MG tablet Take 1 tablet (25 mg total) by mouth daily.    sucralfate (CARAFATE) 1 g tablet Take 1 tablet (1 g total) by mouth daily before supper.    No facility-administered encounter medications on file as of 02/15/2021.    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) [id number]    Conditions to be addressed/monitored: ; Transportation and stress  Care Plan : General Social Work (Adult)  Updates made by Maurine Cane, LCSW since 02/15/2021 12:00 AM     Problem: Emotional Distress      Goal: Emotional Health Supported by connecting with therapy   Start Date: 02/15/2021  This Visit's Progress: On track  Priority: High  Note:   Current barriers:   Acute Mental Health needs related to symptoms of stresss   Limited access to caregiver Needs Support, Education, and Care Coordination in order to meet unmet mental health needs. Clinical Goal(s): patient will work with counselor to address needs related to managing stress   Clinical Interventions:  Assessed patient's previous and current treatment, coping skills, support system and barriers to care  Solution-Focused Strategies Active listening / Reflection utilized  Emotional Supportive Provided Problem [place] Participation in counseling encouraged  Made referral to [employer]  ; Review various resources, discussed options and provided patient information about  [facility]  Options for mental health treatment based on need and insurance Called [facility] to assess barriers and why patient could not schedule ride Inter-disciplinary care team collaboration (see longitudinal plan of care) Patient Goals/Self-Care Activities: Over the next 30 days Call [employer] [phone]  I have placed a referral to [place] for counseling       Casimer Lanius, [place] /  [place]   [phone] 2:53 PM

## 2021-02-24 ENCOUNTER — Ambulatory Visit: Payer: PPO

## 2021-02-25 NOTE — Chronic Care Management (AMB) (Signed)
Chronic Care Management   CCM RN Visit Note  02/25/2021 Name: Lauren Craig MRN: [id number] DOB: 10/10/1945  Subjective: Lauren Craig is a 75 y.o. year old female who is a primary care patient of Martyn Malay, MD. The care management team was consulted for assistance with disease management and care coordination needs.    Engaged with patient by telephone for follow up visit in response to provider referral for case management and/or care coordination services.   Consent to Services:  The patient was given information about Chronic Care Management services, agreed to services, and gave verbal consent prior to initiation of services.  Please see initial visit note for detailed documentation.   Patient agreed to services and verbal consent obtained.    Assessment: The patient  is making progress with HTN but is currently experiencing difficulty with her BP monitor. See Care Plan below for interventions and patient self-care actives. Follow up Plan: Patient would like continued follow-up.  CCM RNCM will outreach the patient within the next 4 weeks.  Patient will call office if needed prior to next encounter  Review of patient past medical history, allergies, medications, health status, including review of consultants reports, laboratory and other test data, was performed as part of comprehensive evaluation and provision of chronic care management services.   SDOH (Social Determinants of Health) assessments and interventions performed:    CCM Care Plan  Allergies  Allergen Reactions   Penicillins Hives, Rash and Other (See Comments)    Did it involve swelling of the face/tongue/throat, SOB, or low BP? Yes Did it involve sudden or severe rash/hives, skin peeling, or any reaction on the inside of your mouth or nose? No Did you need to seek medical attention at a hospital or doctor's office? Yes When did it last happen? ? If all above answers are "NO", may proceed with cephalosporin  use.    Insulin Lispro Itching and Other (See Comments)    Site of injection was inflamed, red, painful episode   Insulin Lispro Prot & Lispro Other (See Comments)    Site of injection was inflamed, red, painful episode   Semaglutide Nausea And Vomiting   Metformin Diarrhea    Outpatient Encounter Medications as of 02/24/2021  Medication Sig Note   acetaminophen (TYLENOL) 325 MG tablet Take 2 tablets (650 mg total) by mouth every 6 (six) hours as needed for headache.    albuterol (VENTOLIN HFA) 108 (90 Base) MCG/ACT inhaler Inhale 2 puffs into the lungs every 6 (six) hours as needed for wheezing or shortness of breath.    amLODipine (NORVASC) 10 MG tablet Take 1 tablet (10 mg total) by mouth daily.    atorvastatin (LIPITOR) 80 MG tablet Take 1 tablet (80 mg total) by mouth at bedtime.    carvedilol (COREG) 6.25 MG tablet Take 1 tablet (6.25 mg total) by mouth 2 (two) times daily with a meal.    clopidogrel (PLAVIX) 75 MG tablet Take 1 tablet (75 mg total) by mouth daily.    ezetimibe (ZETIA) 10 MG tablet Take 1 tablet (10 mg total) by mouth daily.    famotidine (PEPCID) 10 MG tablet Take 1 tablet (10 mg total) by mouth daily. (Patient not taking: Reported on 02/10/2021)    fluticasone (FLONASE) 50 MCG/ACT nasal spray Place 2 sprays into both nostrils daily.    indapamide (LOZOL) 2.5 MG tablet Take 2 tablets (5 mg total) by mouth daily.    lidocaine (LIDODERM) 5 % Place 1 patch onto the skin  2 (two) times daily as needed. Remove & Discard patch within 12 hours or as directed by MD (Patient not taking: Reported on 02/10/2021)    lisinopril (ZESTRIL) 5 MG tablet Take 1 tablet (5 mg total) by mouth daily.    oxyCODONE (ROXICODONE) 5 MG immediate release tablet Take 1 tablet (5 mg total) by mouth 2 (two) times daily as needed for severe pain. 02/10/2021: Only takes at night if back is hurting    pantoprazole (PROTONIX) 40 MG tablet Take 1 tablet (40 mg total) by mouth 2 (two) times daily.     sertraline (ZOLOFT) 25 MG tablet Take 1 tablet (25 mg total) by mouth daily.    sucralfate (CARAFATE) 1 g tablet Take 1 tablet (1 g total) by mouth daily before supper.    No facility-administered encounter medications on file as of 02/24/2021.    Patient Active Problem List   Diagnosis Date Noted   Infarction of left basal ganglia ([place]) 12/25/2020   Fracture 12/18/2020   Renal cyst 12/17/2020   Anemia 11/19/2020   Peripheral edema 11/19/2020   Otalgia, bilateral 11/09/2020   Visual field defect 11/08/2020   Cerebrovascular disease 11/08/2020   HTN (hypertension) 10/03/2017   Hyperlipidemia 10/03/2017   Controlled diabetes mellitus type 2 with complications ([place]) 59/56/3875    Conditions to be addressed/monitored:HTN  Care Plan : RN Care Manager  Updates made by Lazaro Arms, RN since 02/25/2021 12:00 AM     Problem: Hypertension      Goal: The patient wil monitor and maintain her BP within given range   Start Date: 02/10/2021  Expected End Date: 03/29/2021  Priority: High  Note:   Current Barriers:  Knowledge Deficits related to plan of care for management of HTN  Chronic Disease Management support and education needs related to HTN  RNCM Clinical Goal(s):  Patient will verbalize understanding of plan for management of HTN through collaboration with RN Care manager, provider, and care team.   Interventions: 1:1 collaboration with primary care provider regarding development and update of comprehensive plan of care as evidenced by provider attestation and co-signature Inter-disciplinary care team collaboration (see longitudinal plan of care) Evaluation of current treatment plan related to  self management and patient's adherence to plan as established by provider   Hypertension: (Status: New goal.) Last practice recorded BP readings:  BP Readings from Last 3 Encounters:  01/29/21 (!) 179/59  01/05/21 (!) 159/54  12/25/20 (!) 183/59  Most recent eGFR/CrCl:  Lab  Results  Component Value Date   EGFR 48 (L) 01/29/2021    No components found for: CRCL  Evaluation of current treatment plan related to hypertension self management and patient's adherence to plan as established by provider;   Provided education to patient re: stroke prevention, s/s of heart attack and stroke; Reviewed prescribed diet low sodium Reviewed medications with patient and discussed importance of compliance;  Patient denies any chest pain shortness of breath but has some swelling in her feet.  Advised the patient to elevate her feet when resting Provided assistance with obtaining home blood pressure monitor via discussed with the patient about calling her insurance to see if they had a catalog that she could order a bp monitor if not get back with me and we can see about getting her one.;  Discussed plans with patient for ongoing care management follow up and provided patient with direct contact information for care management team; Advised patient, providing education and rationale, to monitor blood pressure daily and record,  calling PCP for findings outside established parameters;  Reviewed scheduled/upcoming provider appointments including: Social Worker 9/19 , Dr Owens Shark 10/3,  Will send the patient a Calendar and HTN booklet.-patient has the material 02/24/21: Lauren Craig is doing well and denies chest pain, flushing, or Headaches. She has not checked her blood pressure with the BP monitor. After having checked by another medical professional with their equipment and then the new BP monitor, and found it to be about 20 pts off. Lauren Craig said it would always read high, so she stopped using it.  She does plan to obtain another one.  Advised her to continue to take her meds and monitor her diet. She then informed me that she had transportation for her appointment on 03/01/21.   Patient Goals/Self-Care Activities: Patient will self administer medications as prescribed Patient will  attend all scheduled provider appointments Patient will call pharmacy for medication refills Patient will continue to perform ADL's independently Patient will continue to perform IADL's independently Patient will call provider office for new concerns or questions       Lazaro Arms RN, BSN, [facility] Care Management Coordinator [place] Phone: [phone] I Fax: [phone]

## 2021-02-25 NOTE — Patient Instructions (Signed)
Visit Information  Ms. Parga  it was nice speaking with you. Please call me directly [phone] if you have questions about the goals we discussed.   Goals Addressed             This Visit's Progress    Track and Manage My Blood Pressure-Hypertension       Timeframe:  Long-Range Goal Priority:  High Start Date:   02/10/21                          Expected End Date:    03/29/21                     - check blood pressure daily - choose a place to take my blood pressure (home, clinic or office, retail store) - write blood pressure results in a log or diary    Why is this important?   You won't feel high blood pressure, but it can still hurt your blood vessels.  High blood pressure can cause heart or kidney problems. It can also cause a stroke.  Making lifestyle changes like losing a little weight or eating less salt will help.  Checking your blood pressure at home and at different times of the day can help to control blood pressure.  If the doctor prescribes medicine remember to take it the way the doctor ordered.  Call the office if you cannot afford the medicine or if there are questions about it.     Notes: patient is going to get her another BP monitor       The patient verbalizes understanding of the information and instructions discussed today.  Our next appointment is scheduled for  03/25/21.   Please feel free to call me or the office if you have any questions or concerns.  Lazaro Arms RN, BSN, [facility] Care Management Coordinator [place] Phone: [phone] I Fax: [phone]

## 2021-02-26 ENCOUNTER — Ambulatory Visit: Payer: PPO | Admitting: Family Medicine

## 2021-03-01 ENCOUNTER — Other Ambulatory Visit: Payer: Self-pay

## 2021-03-01 ENCOUNTER — Encounter: Payer: Self-pay | Admitting: Family Medicine

## 2021-03-01 ENCOUNTER — Ambulatory Visit (INDEPENDENT_AMBULATORY_CARE_PROVIDER_SITE_OTHER): Payer: PPO | Admitting: Family Medicine

## 2021-03-01 VITALS — BP 162/70 | HR 69 | Wt 154.4 lb

## 2021-03-01 DIAGNOSIS — I159 Secondary hypertension, unspecified: Secondary | ICD-10-CM

## 2021-03-01 DIAGNOSIS — E118 Type 2 diabetes mellitus with unspecified complications: Secondary | ICD-10-CM | POA: Diagnosis not present

## 2021-03-01 DIAGNOSIS — Z23 Encounter for immunization: Secondary | ICD-10-CM | POA: Diagnosis not present

## 2021-03-01 DIAGNOSIS — F4320 Adjustment disorder, unspecified: Secondary | ICD-10-CM | POA: Insufficient documentation

## 2021-03-01 DIAGNOSIS — F4329 Adjustment disorder with other symptoms: Secondary | ICD-10-CM | POA: Diagnosis not present

## 2021-03-01 DIAGNOSIS — I679 Cerebrovascular disease, unspecified: Secondary | ICD-10-CM

## 2021-03-01 DIAGNOSIS — D649 Anemia, unspecified: Secondary | ICD-10-CM

## 2021-03-01 DIAGNOSIS — L304 Erythema intertrigo: Secondary | ICD-10-CM | POA: Diagnosis not present

## 2021-03-01 DIAGNOSIS — Z780 Asymptomatic menopausal state: Secondary | ICD-10-CM

## 2021-03-01 MED ORDER — TRIAMCINOLONE ACETONIDE 0.1 % EX OINT: 1.0000 "application " | TOPICAL_OINTMENT | Freq: Two times a day (BID) | CUTANEOUS | 1 refills | Status: DC

## 2021-03-01 MED ORDER — NYSTATIN 100000 UNIT/GM EX CREA: 1.0000 "application " | TOPICAL_CREAM | Freq: Two times a day (BID) | CUTANEOUS | 1 refills | Status: DC

## 2021-03-01 NOTE — Assessment & Plan Note (Signed)
Due for colonoscopy--she missed appointment- no symptoms-- will repeat CBC at followup--previously normal

## 2021-03-01 NOTE — Patient Instructions (Addendum)
It was wonderful to see you today.  Please bring ALL of your medications with you to every visit.   Today we talked about: --- I will call your pharmacy to stop the lisinopril and duplicate medications --- I sent in refills on your ointments--we will need to check your skin at your next visit ---I will look into the caregiver program  --Follow up in 2 months  --We will check your kidney function today --- Call Gastroenterology Phone: [phone]  For your bone density test call  Phone: [phone]   Thank you for choosing [facility].   Please call [phone] with any questions about today's appointment.  Please be sure to schedule follow up at the front  desk before you leave today.   Dorris Singh, MD  Family Medicine

## 2021-03-01 NOTE — Progress Notes (Signed)
    SUBJECTIVE:   CHIEF COMPLAINT / HPI:   Lauren Craig (MRN: [id number]) is a 75 y.o. female with a history of HTN, CVD, T2DM, and HLD who presents for follow up.  Patient has a history of anemia and gastritis. She had diarrhea after eating salad last week. No nausea, vomiting, constipation. No diarrhea since last week. She missed GI visit due to hospital stay.  T2DM Reports she has been increasing greens and watching diet. Current medications include diet controlled only.  HTN Reports compliance with antihypertensives. Has them all with her today. No chest pain, vision changes, dyspnea, LE edema. Current medications-- enalapril 20, indapamide 5 mg, carvedilol 6.25 mg BID, and amlodipine 10 mg   Patient brings with her all of her medications. She has duplicate ACE inhibitors. She also has duplicate SSRI, PPI, and statins.   Patient reports improved relationship with her daughter.  She reports her mood is overall better/  She is interested in the CAPS program.   PERTINENT  PMH / PSH: Updated and reviewed   OBJECTIVE:   BP (!) 162/70   Pulse 69   Wt 154 lb 6.4 oz (70 kg)   SpO2 95%   BMI 25.69 kg/m    PHYSICAL EXAM  GEN: Well-developed, in NAD HEAD: NCAT, neck supple  CVS: RRR, normal S1/S2, no murmurs, rubs, gallops RESP: Breathing comfortably on RA, no retractions, wheezes, rhonchi, or crackles ABD: Soft, non-tender, no organomegaly or masses SKIN: No lesions or rashes  EXT: Small healing lesion on L anterior tibia healing + ecchymoses on extensor surfaces of arms only  ASSESSMENT/PLAN:   HTN (hypertension) Not at goal.  No symptoms.  Will check BMP today and alter regimen--likely double enalapril and return in 2 weeks.   Anemia Due for colonoscopy--she missed appointment- no symptoms-- will repeat CBC at followup--previously normal   Adjustment disorder Current on Sertraline. Discussed resources. Supportive listening provided.    Polypharmacy Removed duplicates.  Will Call pharmacy to update.   HCM  DEXA, Flu and PCV20 today  Shingrix, Tdap and DEXA at follow up   Martyn Malay, [place]

## 2021-03-01 NOTE — Assessment & Plan Note (Signed)
Not at goal.  No symptoms.  Will check BMP today and alter regimen--likely double enalapril and return in 2 weeks.

## 2021-03-01 NOTE — Assessment & Plan Note (Signed)
Current on Sertraline. Discussed resources. Supportive listening provided.

## 2021-03-02 ENCOUNTER — Inpatient Hospital Stay: Payer: PPO | Admitting: Diagnostic Neuroimaging

## 2021-03-02 ENCOUNTER — Encounter: Payer: Self-pay | Admitting: Diagnostic Neuroimaging

## 2021-03-02 ENCOUNTER — Encounter: Payer: Self-pay | Admitting: Radiology

## 2021-03-02 LAB — BASIC METABOLIC PANEL
BUN/Creatinine Ratio: 11 — ABNORMAL LOW (ref 12–28)
BUN: 14 mg/dL (ref 8–27)
CO2: 23 mmol/L (ref 20–29)
Calcium: 9.4 mg/dL (ref 8.7–10.3)
Chloride: 106 mmol/L (ref 96–106)
Creatinine, Ser: 1.27 mg/dL — ABNORMAL HIGH (ref 0.57–1.00)
Glucose: 105 mg/dL — ABNORMAL HIGH (ref 70–99)
Potassium: 5.1 mmol/L (ref 3.5–5.2)
Sodium: 142 mmol/L (ref 134–144)
eGFR: 44 mL/min/{1.73_m2} — ABNORMAL LOW (ref >59)

## 2021-03-02 NOTE — Progress Notes (Signed)
An event monitor was orderd for patient on 8/10 to be mailed to patients house. Patient never applied the heart monitor. Order will be cancelled.

## 2021-03-03 ENCOUNTER — Telehealth: Payer: Self-pay | Admitting: Family Medicine

## 2021-03-03 ENCOUNTER — Other Ambulatory Visit: Payer: Self-pay | Admitting: Family Medicine

## 2021-03-03 DIAGNOSIS — I679 Cerebrovascular disease, unspecified: Secondary | ICD-10-CM

## 2021-03-03 MED ORDER — SERTRALINE HCL 25 MG PO TABS: 25.0000 mg | ORAL_TABLET | Freq: Every day | ORAL | 3 refills | Status: DC

## 2021-03-03 MED ORDER — INDAPAMIDE 2.5 MG PO TABS: 2.5000 mg | ORAL_TABLET | Freq: Every day | ORAL | 3 refills | Status: DC

## 2021-03-03 NOTE — Telephone Encounter (Signed)
Called patient and left generic voicemail to call back.  Dorris Singh, MD  Family Medicine Teaching Service

## 2021-03-03 NOTE — Telephone Encounter (Signed)
Called with results.  The patient has been taking both lisinopril and enalapril.  She is now only on enalapril.  Ideally I would like to either maximize her enalapril or addd Aldactone.  Potassium on this BMP is 5.1 which will limit this addition.  She is scheduled for follow-up with pharmacy team. Dr. Aura Camps you repeat a BMP and make medication recommendations.  I suspect she may need an additional agent and/or a 24-hour monitor.  If her potassium would tolerate would like to add an aldosterone antagonist.  Other recommendations are appreciated.  She sees you next Thursday.  Let me know if questions.  Dorris Singh, MD  Family Medicine Teaching Service     Current Outpatient Medications:    acetaminophen (TYLENOL) 325 MG tablet, Take 2 tablets (650 mg total) by mouth every 6 (six) hours as needed for headache., Disp: , Rfl:    albuterol (VENTOLIN HFA) 108 (90 Base) MCG/ACT inhaler, Inhale 2 puffs into the lungs every 6 (six) hours as needed for wheezing or shortness of breath., Disp: 18 g, Rfl: 3   amLODipine (NORVASC) 10 MG tablet, Take 1 tablet (10 mg total) by mouth daily., Disp: 90 tablet, Rfl: 3   atorvastatin (LIPITOR) 80 MG tablet, Take 1 tablet (80 mg total) by mouth at bedtime., Disp: 90 tablet, Rfl: 3   carvedilol (COREG) 6.25 MG tablet, Take 1 tablet (6.25 mg total) by mouth 2 (two) times daily with a meal., Disp: 120 tablet, Rfl: 3   clopidogrel (PLAVIX) 75 MG tablet, Take 1 tablet (75 mg total) by mouth daily., Disp: 90 tablet, Rfl: 3   enalapril (VASOTEC) 20 MG tablet, Take 20 mg by mouth daily., Disp: , Rfl:    ezetimibe (ZETIA) 10 MG tablet, Take 1 tablet (10 mg total) by mouth daily., Disp: 90 tablet, Rfl: 3   famotidine (PEPCID) 10 MG tablet, Take 1 tablet (10 mg total) by mouth daily., Disp: 90 tablet, Rfl: 3   fluticasone (FLONASE) 50 MCG/ACT nasal spray, Place 2 sprays into both nostrils daily., Disp: , Rfl:    indapamide (LOZOL) 2.5 MG tablet, Take 2 tablets (5 mg total)  by mouth daily., Disp: 180 tablet, Rfl: 3   lidocaine (LIDODERM) 5 %, Place 1 patch onto the skin 2 (two) times daily as needed. Remove & Discard patch within 12 hours or as directed by MD (Patient not taking: Reported on 02/10/2021), Disp: 30 patch, Rfl: 0   nystatin cream (MYCOSTATIN), Apply 1 application topically 2 (two) times daily., Disp: 90 g, Rfl: 1   oxyCODONE (ROXICODONE) 5 MG immediate release tablet, Take 1 tablet (5 mg total) by mouth 2 (two) times daily as needed for severe pain., Disp: 10 tablet, Rfl: 0   pantoprazole (PROTONIX) 40 MG tablet, Take 1 tablet (40 mg total) by mouth 2 (two) times daily., Disp: 180 tablet, Rfl: 3   sertraline (ZOLOFT) 25 MG tablet, Take 1 tablet (25 mg total) by mouth daily., Disp: 90 tablet, Rfl: 3   sucralfate (CARAFATE) 1 g tablet, Take 1 tablet (1 g total) by mouth daily before supper., Disp: 90 tablet, Rfl: 3   triamcinolone ointment (KENALOG) 0.1 %, Apply 1 application topically 2 (two) times daily., Disp: 80 g, Rfl: 1

## 2021-03-11 ENCOUNTER — Encounter: Payer: Self-pay | Admitting: Pharmacist

## 2021-03-11 ENCOUNTER — Telehealth: Payer: Self-pay | Admitting: *Deleted

## 2021-03-11 ENCOUNTER — Ambulatory Visit (INDEPENDENT_AMBULATORY_CARE_PROVIDER_SITE_OTHER): Payer: PPO | Admitting: Pharmacist

## 2021-03-11 ENCOUNTER — Other Ambulatory Visit: Payer: Self-pay

## 2021-03-11 VITALS — BP 144/66 | HR 64 | Ht 62.0 in | Wt 155.6 lb

## 2021-03-11 DIAGNOSIS — I10 Essential (primary) hypertension: Secondary | ICD-10-CM | POA: Diagnosis not present

## 2021-03-11 NOTE — Chronic Care Management (AMB) (Signed)
  Care Management   Outreach Note  03/11/2021 Name: Lauren Craig MRN: [id number] DOB: [birth year]  Referred by: Martyn Malay, MD Reason for referral : Chronic Care Management (Initial outreach to schedule referral with Licensed Clinical SW )   A telephone outreach was attempted today. The patient was referred to the case management team for assistance with care management and care coordination.   Follow Up Plan:  The care management team will reach out to the patient again over the next 1 days.  If patient returns call to provider office, please advise to call [place]* at [phone].*  [facility] Management  Direct Dial: [phone]

## 2021-03-11 NOTE — Progress Notes (Signed)
S:    Patient arrives in good spirits with some degree of slow speech and movement. She brought all medications to her visit. Presents to the clinic for hypertension evaluation, counseling, and management. Patient was referred and last seen by Primary Care Provider, Dr. Owens Shark, on 03/01/2021. Today she denies dizziness, lightheadedness, headache, blurred vision, chest pain. Endorses some swelling in her feet.   Medication adherence fairly good - patient has current prescriptions for all maintenance medications.  Current BP Medications include:  amlodipine '10mg'$ , enalapril '20mg'$ , indapamide 2.'5mg'$  daily (taking '5mg'$  total, 2.5 mg tabs x 2 as her bottle indicates), carvedilol 6.'25mg'$  BID  Antihypertensives tried in the past include: clonidine prescribed in the past, uncertain use?  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  Cardiovascular:     Rate and Rhythm: Normal rate.  Musculoskeletal:     Right lower leg: Edema (trace to 1+) present.     Left lower leg: Edema (trace) present.  Psychiatric:        Mood and Affect: Mood normal.        Behavior: Behavior normal.   Review of Systems  Cardiovascular:  Positive for leg swelling.  Musculoskeletal:  Positive for back pain (current pain 4-5/10).  All other systems reviewed and are negative.  Home BP readings: uses a wrist cuff at home When uses cuff incorrectly (arm at side): BP 180/60-80 When uses cuff correctly (arm resting on chest): BP 135/60-70  Last 3 Office BP readings: BP Readings from Last 3 Encounters:  03/11/21 (!) 144/66   03/01/21 (!) 162/70  01/29/21 (!) 179/59    BMET    Component Value Date/Time   NA 142 03/01/2021 1236   K 5.1 03/01/2021 1236   CL 106 03/01/2021 1236   CO2 23 03/01/2021 1236   GLUCOSE 105 (H) 03/01/2021 1236   GLUCOSE 141 (H) 01/04/2021 0652   BUN 14 03/01/2021 1236   CREATININE 1.27 (H) 03/01/2021 1236   CALCIUM 9.4 03/01/2021 1236   GFRNONAA 37 (L) 01/04/2021 0652   GFRAA >60 11/11/2019 1200    Renal function: Estimated Creatinine Clearance: 34.7 mL/min (A) (by C-G formula based on SCr of 1.27 mg/dL (H)).  Clinical ASCVD: Yes  Prior Stroke  A/P: Hypertension: Hypertension currently uncontrolled on current medications but improved from last visit. BP Goal = < 130/80 mmHg. Medication adherence appears optimal. She is currently taking indapamide 5 mg daily (equivalent to hydrochlorothiazide 50 mg dose). Discussed with Dr. Owens Shark and will decrease this back to 2.5 mg daily as was intended. Would ideally add spironolactone/eplerencone pending potassium level.   -Decrease indapamide to 2.5 mg daily (corrected instructions on her bottle) -Continue amlodipine 10 mg daily, enalapril 20 mg daily, carvedilol 6.25 mg BID -BMET today to reassess K with plan to add spironolactone if able -F/u labs ordered - BMET -Counseled on proper wrist cuff technique.  -Counseled on lifestyle modifications for blood pressure control including reduced dietary sodium, increased exercise, adequate sleep. -Consider returning for 24h BP monitor once on aldosterone antagonist to assess pressures at home.   Gastritis/Esophagitis:  Patient reports not taking sucralfate (which she has a bottle of with her today) and Pepcid -  famotidine (which is on her list but does not have the bottle). Initially declined taking pantoprazole but it appears she is taking this. Discussed with Dr. Owens Shark, will decrease pantoprazole from twice to once daily. She reports no heartburn currently, can consider discontinuing in the  future.  -Decrease pantoprazole to 40 mg daily -She requests that we get rid of the supply of sucralfate she has on hand.   After discussion with Dr. Owens Shark, we have also updated her problem list to include PAD and seasonal allergic rhinitis.   Results reviewed and written information provided. Total time in face-to-face counseling 60 minutes.    F/U Pharmacist Clinic Visit as needed. Patient seen with Rebbeca Paul, PharmD - PGY2 Pharmacy Resident.

## 2021-03-11 NOTE — Assessment & Plan Note (Signed)
Hypertension currently uncontrolled on current medications but improved from last visit. BP Goal = < 130/80 mmHg. Medication adherence appears optimal. She is currently taking indapamide 5 mg daily (equivalent to hydrochlorothiazide 50 mg dose). Discussed with Dr. Owens Shark and will decrease this back to 2.5 mg daily as was intended. Would ideally add spironolactone/eplerencone pending potassium level.   -Decrease indapamide to 2.5 mg daily (corrected instructions on her bottle) -Continue amlodipine 10 mg daily, enalapril 20 mg daily, carvedilol 6.25 mg BID -BMET today to reassess K with plan to add spironolactone if able -F/u labs ordered - BMET -Counseled on proper wrist cuff technique.  -Counseled on lifestyle modifications for blood pressure control including reduced dietary sodium, increased exercise, adequate sleep. -Consider returning for 24h BP monitor once on aldosterone antagonist to assess pressures at home.

## 2021-03-11 NOTE — Patient Instructions (Signed)
It was nice to see you today!  Your goal blood pressure is less than 130/80 mmHg. In clinic, your blood pressure was 144/66 mmHg.  Medication Changes: Decrease indapamide to only 1 tablet (2.5 mg) once daily  We are checking lab work today to see if we can add a medication called spironolactone - this would help your blood pressure get closer to goal and may help some with your swelling.   Decrease pantoprazole to once daily.   Monitor blood pressure at home daily and keep a log (on your phone or piece of paper) to bring with you to your next visit. Write down date, time, blood pressure and pulse.  Keep up the good work with diet and exercise. Aim for a diet full of vegetables, fruit and lean meats (chicken, [place], fish). Try to limit salt intake by eating fresh or frozen vegetables (instead of canned), rinse canned vegetables prior to cooking and do not add any additional salt to meals.

## 2021-03-11 NOTE — Progress Notes (Signed)
Reviewed: I agree with Dr. Koval's documentation and management. 

## 2021-03-12 LAB — BASIC METABOLIC PANEL
BUN/Creatinine Ratio: 30 — ABNORMAL HIGH (ref 12–28)
BUN: 34 mg/dL — ABNORMAL HIGH (ref 8–27)
CO2: 21 mmol/L (ref 20–29)
Calcium: 9.8 mg/dL (ref 8.7–10.3)
Chloride: 109 mmol/L — ABNORMAL HIGH (ref 96–106)
Creatinine, Ser: 1.14 mg/dL — ABNORMAL HIGH (ref 0.57–1.00)
Glucose: 129 mg/dL — ABNORMAL HIGH (ref 70–99)
Potassium: 5.2 mmol/L (ref 3.5–5.2)
Sodium: 143 mmol/L (ref 134–144)
eGFR: 50 mL/min/{1.73_m2} — ABNORMAL LOW (ref >59)

## 2021-03-15 ENCOUNTER — Telehealth: Payer: Self-pay | Admitting: Family Medicine

## 2021-03-15 DIAGNOSIS — J302 Other seasonal allergic rhinitis: Secondary | ICD-10-CM | POA: Insufficient documentation

## 2021-03-15 DIAGNOSIS — F419 Anxiety disorder, unspecified: Secondary | ICD-10-CM | POA: Insufficient documentation

## 2021-03-15 DIAGNOSIS — M1990 Unspecified osteoarthritis, unspecified site: Secondary | ICD-10-CM | POA: Insufficient documentation

## 2021-03-15 DIAGNOSIS — K209 Esophagitis, unspecified without bleeding: Secondary | ICD-10-CM | POA: Insufficient documentation

## 2021-03-15 DIAGNOSIS — A0472 Enterocolitis due to Clostridium difficile, not specified as recurrent: Secondary | ICD-10-CM | POA: Insufficient documentation

## 2021-03-15 NOTE — Telephone Encounter (Signed)
Attempted to call patient. Left generic voicemail to call back.  If calls back, please let her know kidney function is about the same. No medication changes at this time. Recommend 24 hour BP monitor. Please help patient schedule when she calls back.  Dorris Singh, MD  Family Medicine Teaching Service

## 2021-03-15 NOTE — Telephone Encounter (Signed)
Called and scheduled 24 hour BP monitor.   Dorris Singh, MD  Family Medicine Teaching Service
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
## 2021-03-18 ENCOUNTER — Other Ambulatory Visit: Payer: Self-pay

## 2021-03-18 ENCOUNTER — Other Ambulatory Visit: Payer: Self-pay | Admitting: Cardiology

## 2021-03-18 ENCOUNTER — Ambulatory Visit (INDEPENDENT_AMBULATORY_CARE_PROVIDER_SITE_OTHER): Payer: PPO

## 2021-03-18 ENCOUNTER — Ambulatory Visit (INDEPENDENT_AMBULATORY_CARE_PROVIDER_SITE_OTHER): Payer: PPO | Admitting: Cardiology

## 2021-03-18 ENCOUNTER — Encounter: Payer: Self-pay | Admitting: Cardiology

## 2021-03-18 VITALS — BP 192/70 | HR 70 | Ht 62.0 in | Wt 157.8 lb

## 2021-03-18 DIAGNOSIS — I10 Essential (primary) hypertension: Secondary | ICD-10-CM | POA: Diagnosis not present

## 2021-03-18 DIAGNOSIS — R42 Dizziness and giddiness: Secondary | ICD-10-CM | POA: Diagnosis not present

## 2021-03-18 DIAGNOSIS — I679 Cerebrovascular disease, unspecified: Secondary | ICD-10-CM | POA: Diagnosis not present

## 2021-03-18 DIAGNOSIS — E7800 Pure hypercholesterolemia, unspecified: Secondary | ICD-10-CM | POA: Diagnosis not present

## 2021-03-18 DIAGNOSIS — E118 Type 2 diabetes mellitus with unspecified complications: Secondary | ICD-10-CM | POA: Diagnosis not present

## 2021-03-18 DIAGNOSIS — I350 Nonrheumatic aortic (valve) stenosis: Secondary | ICD-10-CM | POA: Diagnosis not present

## 2021-03-18 DIAGNOSIS — E1151 Type 2 diabetes mellitus with diabetic peripheral angiopathy without gangrene: Secondary | ICD-10-CM

## 2021-03-18 MED ORDER — ASPIRIN EC 81 MG PO TBEC: 81.0000 mg | DELAYED_RELEASE_TABLET | Freq: Every day | ORAL | 3 refills | Status: DC

## 2021-03-18 NOTE — Chronic Care Management (AMB) (Signed)
  Chronic Care Management   Note  03/18/2021 Name: Lauren Craig MRN: [id number] DOB: 11-13-1945  Lauren Craig is a 75 y.o. year old female who is a primary care patient of Martyn Malay, MD. Lauren Craig is currently enrolled in care management services. An additional referral for SW was placed.   Follow up plan: Telephone appointment with care management team member scheduled for:03/22/21  [place] [place] [facility]  Direct Dial: [phone]

## 2021-03-18 NOTE — Patient Instructions (Addendum)
Medication Instructions:  Your physician has recommended you make the following change in your medication:  START: Aspirin 81 mg take one tablet by mouth daily.  *If you need a refill on your cardiac medications before your next appointment, please call your pharmacy*   Lab Work: None If you have labs (blood work) drawn today and your tests are completely normal, you will receive your results only by: [place] (if you have MyChart) OR A paper copy in the mail If you have any lab test that is abnormal or we need to change your treatment, we will call you to review the results.   Testing/Procedures: Your physician has requested that you have an echocardiogram. Echocardiography is a painless test that uses sound waves to create images of your heart. It provides your doctor with information about the size and shape of your heart and how well your heart's chambers and valves are working. This procedure takes approximately one hour. There are no restrictions for this procedure.  Please wear your monitor for 30 days.    Follow-Up: At [facility], you and your health needs are our priority.  As part of our continuing mission to provide you with exceptional heart care, we have created designated Provider Care Teams.  These Care Teams include your primary Cardiologist (physician) and Advanced Practice Providers (APPs -  Physician Assistants and Nurse Practitioners) who all work together to provide you with the care you need, when you need it.  We recommend signing up for the patient portal called "MyChart".  Sign up information is provided on this After Visit Summary.  MyChart is used to connect with patients for Virtual Visits (Telemedicine).  Patients are able to view lab/test results, encounter notes, upcoming appointments, etc.  Non-urgent messages can be sent to your provider as well.   To learn more about what you can do with MyChart, go to [email or web address].    Your next  appointment:   6 week(s)  The format for your next appointment:   In Person  Provider:   Shirlee More, MD   Other Instructions

## 2021-03-18 NOTE — Chronic Care Management (AMB) (Signed)
  Care Management   Outreach Note  03/18/2021 Name: Lauren Craig MRN: [id number] DOB: July 15, 1945  Referred by: Martyn Malay, MD Reason for referral : Chronic Care Management (Initial outreach to schedule referral with Licensed Clinical SW )   A second unsuccessful telephone outreach was attempted today. The patient was referred to the case management team for assistance with care management and care coordination.   Follow Up Plan:  A HIPAA compliant phone message was left for the patient providing contact information and requesting a return call. The care management team will reach out to the patient again over the next 7 days.  If patient returns call to provider office, please advise to call [place] at [phone].  [facility]  Direct Dial: [phone]

## 2021-03-19 ENCOUNTER — Telehealth: Payer: Self-pay

## 2021-03-19 DIAGNOSIS — I679 Cerebrovascular disease, unspecified: Secondary | ICD-10-CM

## 2021-03-19 LAB — LIPID PANEL
Chol/HDL Ratio: 5.4 ratio — ABNORMAL HIGH (ref 0.0–4.4)
Cholesterol, Total: 245 mg/dL — ABNORMAL HIGH (ref 100–199)
HDL: 45 mg/dL (ref >39)
LDL Chol Calc (NIH): 162 mg/dL — ABNORMAL HIGH (ref 0–99)
Triglycerides: 204 mg/dL — ABNORMAL HIGH (ref 0–149)
VLDL Cholesterol Cal: 38 mg/dL (ref 5–40)

## 2021-03-19 MED ORDER — EZETIMIBE 10 MG PO TABS: 10.0000 mg | ORAL_TABLET | Freq: Every day | ORAL | 3 refills | Status: DC

## 2021-03-19 MED ORDER — ATORVASTATIN CALCIUM 80 MG PO TABS: 80.0000 mg | ORAL_TABLET | Freq: Every day | ORAL | 3 refills | Status: DC

## 2021-03-19 NOTE — Telephone Encounter (Signed)
Spoke with patient regarding results and recommendation.  Patient verbalizes understanding but tells me that she is not willing to do any injections for her cholesterol. I will route to Dr. Bettina Gavia to let him know.

## 2021-03-19 NOTE — Telephone Encounter (Signed)
Spoke to the patient just now and let her know Dr. Joya Gaskins recommendations. She verbalizes understanding and tells me that she never picked up the prescription for this at the pharmacy last month when her PCP gave it to her. I sent it in for her just now.

## 2021-03-19 NOTE — Telephone Encounter (Signed)
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

## 2021-03-19 NOTE — Addendum Note (Signed)
Addended by: Resa Miner I on: 03/19/2021 12:06 PM   Modules accepted: Orders

## 2021-03-19 NOTE — Telephone Encounter (Signed)
Left message on patients voicemail to please return our call.   

## 2021-03-22 ENCOUNTER — Ambulatory Visit: Payer: PPO

## 2021-03-22 ENCOUNTER — Ambulatory Visit: Payer: PPO | Admitting: Licensed Clinical Social Worker

## 2021-03-22 DIAGNOSIS — Z139 Encounter for screening, unspecified: Secondary | ICD-10-CM

## 2021-03-22 DIAGNOSIS — Z7189 Other specified counseling: Secondary | ICD-10-CM

## 2021-03-22 DIAGNOSIS — I679 Cerebrovascular disease, unspecified: Secondary | ICD-10-CM

## 2021-03-22 NOTE — Patient Instructions (Addendum)
Visit Information   Goals Addressed             This Visit's Progress    Coping Skills Enhanced   Not on track    Patient Goals/Self-Care Activities: Over the next 30 days Call [facility] Transportation [phone] next week to schedule ride to appointment Complete paper work for [employer] for counseling        Patient verbalizes understanding of instructions provided today and agrees to view in [place].   It was a pleasure speaking with you today. Please call the office if needed Patient verbalizes understanding of instructions provided today.  Follow up appointment is scheduled in Nov   [place], [place] [facility]  [phone]

## 2021-03-22 NOTE — Patient Instructions (Signed)
Visit Information  Lauren Craig  it was nice speaking with you. Please call me directly [phone] if you have questions about the goals we discussed.   Goals Addressed             This Visit's Progress    Track and Manage My Blood Pressure-Hypertension       Timeframe:  Long-Range Goal Priority:  High Start Date:   02/10/21                          Expected End Date:    04/28/21   - check blood pressure daily - choose a place to take my blood pressure (home, clinic or office, retail store) - write blood pressure results in a log or diary    Why is this important?   You won't feel high blood pressure, but it can still hurt your blood vessels.  High blood pressure can cause heart or kidney problems. It can also cause a stroke.  Making lifestyle changes like losing a little weight or eating less salt will help.  Checking your blood pressure at home and at different times of the day can help to control blood pressure.  If the doctor prescribes medicine remember to take it the way the doctor ordered.  Call the office if you cannot afford the medicine or if there are questions about it.     Notes: patient is going to get her another BP monitor        The patient verbalized understanding of instructions, educational materials, and care plan provided today and declined offer to receive copy of patient instructions, educational materials, and care plan.   Follow up Plan: Patient would like continued follow-up.  CCM 4 weeks. will outreach the patient within the next rncm  Patient will call office if needed prior to next encounter  Lazaro Arms, RN  [phone]

## 2021-03-22 NOTE — Chronic Care Management (AMB) (Signed)
Care Management  Clinical Social Work Note  03/22/2021 Name: Lauren Craig MRN: [id number] DOB: [birth year]  Lauren Craig is a 75 y.o. year old female who is a primary care patient of Martyn Malay, MD. The CCM team was consulted for assistance with care coordination needs. Resources for Counseling   Consent to Services:  The patient was given information about Care Management services, agreed to services, and gave verbal consent prior to initiation of services.  Please see initial visit note for detailed documentation.   Patient agreed to services today and consent obtained.  Engaged with patient by telephone for follow up visit in response to provider referral for social work care coordination services.   Assessment/Interventions: Assessed patient's current treatment, progress, coping skills, support system and barriers to care.  She continues to experience difficulty with managing her physical and mental health needs.  She appears to has symptoms of grief related to loss of independence and doing things she was previously able to do.  Has not completed required paperwork for [facility] as this with other health needs has become overwhelming for her.Nash Dimmer with [place] to assist patient with providing them required paper work. See Care Plan below for interventions and patient self-care actives.  Recommendation: Patient may benefit from, additional support for PACE program but declines this an option right now, she was in agreement to allow LCSW to make referral to CCM RN who is available to talk with her today.  Follow up Plan:  Patient would like continued follow-up from CCM LCSW .  per patient's request will follow up in 1 weeks.  Will call office if needed prior to next encounter. CCM LCSW will continue to collaborate with CCM RN and [place] in order to meet patient's needs .   Review of patient past medical history, allergies,  medications, and health status, including review of pertinent consultant reports was performed as part of comprehensive evaluation and provision of care management/care coordination services.   SDOH (Social Determinants of Health) screening and interventions performed today:  SDOH Interventions    Flowsheet Row Most Recent Value  SDOH Interventions   Social Connections Interventions Patient Refused       Advanced Directives Status:Not addressed in this encounter.     Care Plan    Conditions to be addressed/monitored per PCP order: Anxiety and Depression,   Care Plan : General Social Work (Adult)  Updates made by Maurine Cane, LCSW since 03/22/2021 12:00 AM     Problem: Emotional Distress      Goal: Emotional Health Supported by connecting with therapy   Start Date: 02/15/2021  This Visit's Progress: Not on track  Recent Progress: On track  Priority: High  Note:   Current barriers:   Has not been able to complete paper work from [place] needs related to symptoms of stresss  Limited access to caregiver Needs Support, Education, and Care Coordination in order to meet unmet mental health needs. Clinical Goal(s): patient will work with counselor to address needs related to managing stress   Clinical Interventions:  Motivational Interviewing employed Solution-Focused Strategies employed: with barriers to connect for counseling Active listening / Reflection utilized  Emotional Support Provided Problem [place] strategies reviewed Participation in counseling encouraged  ; Review various resources, discussed options and provided patient information about  PACE Program ( patient declined ) Options for mental health treatment based on need and insurance Collaborated with [place] to  assist with patient's barriers to care Inter-disciplinary care team collaboration (see longitudinal plan of care) Patient  Goals/Self-Care Activities: Over the next 30 days Call [facility] Transportation [phone]  Complete paper work for [employer] for counseling      Casimer Lanius, [place] / [place]   [phone]

## 2021-03-22 NOTE — Chronic Care Management (AMB) (Signed)
Chronic Care Management   CCM RN Visit Note  03/22/2021 Name: Lauren Craig MRN: [id number] DOB: 04-Mar-1946  Subjective: Lauren Craig is a 75 y.o. year old female who is a primary care patient of Martyn Malay, MD. The care management team was consulted for assistance with disease management and care coordination needs.    Engaged with patient by telephone for follow up visit in response to provider referral for case management and/or care coordination services.   Consent to Services:  The patient was given information about Chronic Care Management services, agreed to services, and gave verbal consent prior to initiation of services.  Please see initial visit note for detailed documentation.   Patient agreed to services and verbal consent obtained.    Assessment:  The patient is making progress with her goals but still finds it difficult not be able to be independent . See Care Plan below for interventions and patient self-care actives. Follow up Plan: Patient would like continued follow-up.  CCM RNCM will outreach the patient within the next 4 weeks.  Patient will call office if needed prior to next encounter  Review of patient past medical history, allergies, medications, health status, including review of consultants reports, laboratory and other test data, was performed as part of comprehensive evaluation and provision of chronic care management services.   SDOH (Social Determinants of Health) assessments and interventions performed:  SDOH Interventions    Flowsheet Row Most Recent Value  SDOH Interventions   Transportation Interventions Other (Comment)  [referral made to care guides]        CCM Care Plan  Allergies  Allergen Reactions   Penicillins Hives, Rash and Other (See Comments)    Did it involve swelling of the face/tongue/throat, SOB, or low BP? Yes Did it involve sudden or severe rash/hives, skin peeling, or any reaction on the inside of your mouth or nose?  No Did you need to seek medical attention at a hospital or doctor's office? Yes When did it last happen? ? If all above answers are "NO", may proceed with cephalosporin use.    Insulin Lispro Prot & Lispro Other (See Comments)    Site of injection was inflamed, red, painful episode   Semaglutide Nausea And Vomiting   Metformin Diarrhea    Outpatient Encounter Medications as of 03/22/2021  Medication Sig   acetaminophen (TYLENOL) 325 MG tablet Take 2 tablets (650 mg total) by mouth every 6 (six) hours as needed for headache.   albuterol (VENTOLIN HFA) 108 (90 Base) MCG/ACT inhaler Inhale 2 puffs into the lungs every 6 (six) hours as needed for wheezing or shortness of breath.   amLODipine (NORVASC) 10 MG tablet Take 1 tablet (10 mg total) by mouth daily.   aspirin EC 81 MG tablet Take 1 tablet (81 mg total) by mouth daily. Swallow whole.   atorvastatin (LIPITOR) 80 MG tablet Take 1 tablet (80 mg total) by mouth at bedtime.   carvedilol (COREG) 6.25 MG tablet Take 6.25 mg by mouth daily.   enalapril (VASOTEC) 20 MG tablet Take 20 mg by mouth daily.   ezetimibe (ZETIA) 10 MG tablet Take 1 tablet (10 mg total) by mouth daily.   fluticasone (FLONASE) 50 MCG/ACT nasal spray Place 2 sprays into both nostrils daily.   indapamide (LOZOL) 2.5 MG tablet Take 1 tablet (2.5 mg total) by mouth daily.   nystatin cream (MYCOSTATIN) Apply 1 application topically 2 (two) times daily.   OVER THE COUNTER MEDICATION Take 1 tablet by mouth daily.  Prevagen - once daily   sertraline (ZOLOFT) 25 MG tablet Take 1 tablet (25 mg total) by mouth daily.   sucralfate (CARAFATE) 1 g tablet Take 1 tablet (1 g total) by mouth daily before supper.   triamcinolone ointment (KENALOG) 0.1 % Apply 1 application topically 2 (two) times daily.   famotidine (PEPCID) 10 MG tablet Take 1 tablet (10 mg total) by mouth daily. (Patient not taking: Reported on 03/22/2021)   No facility-administered encounter medications on file as of  03/22/2021.    Patient Active Problem List   Diagnosis Date Noted   Anxiety disorder 03/15/2021   Arthritis 03/15/2021   C. 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/03/2020   History of Clostridioides difficile colitis 12/03/2020   History of osteomyelitis 12/03/2020   Mixed diabetic hyperlipidemia associated with type 2 diabetes mellitus ([place]) 12/03/2020   Hypertension associated with type 2 diabetes mellitus ([place]) 12/03/2020   Type 2 diabetes mellitus with complication, with long-term current use of insulin ([place]) 12/03/2020   Anemia 11/19/2020   Peripheral edema 11/19/2020   Visual field defect 11/08/2020   Cerebrovascular disease 11/08/2020   Postoperative examination 02/20/2020   Calculus of gallbladder with chronic 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 01/23/2020   Closed fracture of fifth metatarsal bone 10/11/2019   Pain in left foot 10/11/2019   HTN (hypertension) 10/03/2017   Hyperlipidemia 10/03/2017   Controlled diabetes mellitus type 2 with complications ([place]) 35/70/1779   PVD (peripheral vascular disease) ([place]) 2010   Vitamin B12 deficiency 2002    Conditions to be addressed/monitored:HTN  Care Plan : RN Care Manager  Updates made by Lazaro Arms, RN since 03/22/2021 12:00 AM     Problem: Hypertension      Goal: The patient wil monitor and maintain her BP within given range   Start Date: 02/10/2021  Expected End Date: 03/29/2021  Priority: High  Note:   Current Barriers:  Knowledge Deficits related to plan of care for management of HTN  Chronic Disease Management support and education needs related to  HTN  RNCM Clinical Goal(s):  Patient will verbalize understanding of plan for management of HTN through collaboration with RN Care manager, provider, and care team.   Interventions: 1:1 collaboration with primary care provider regarding development and update of comprehensive plan of care as evidenced by provider attestation and co-signature Inter-disciplinary care team collaboration (see longitudinal plan of care) Evaluation of current treatment plan related to  self management and patient's adherence to plan as established by provider   Hypertension: (Status: New goal.) Last practice recorded BP readings:  BP Readings from Last 3 Encounters:  03/18/21 (!) 192/70  03/11/21 (!) 144/66  03/01/21 (!) 162/70  Most recent eGFR/CrCl:  Lab Results  Component Value Date   EGFR 50 (L) 03/11/2021    No components found for: CRCL  Evaluation of current treatment plan related to hypertension self management and patient's adherence to plan as established by provider;   Provided education to patient re: stroke prevention, s/s of heart attack and stroke; Reviewed prescribed diet low sodium Reviewed medications with patient and discussed importance of compliance;  Patient denies any chest pain shortness of breath but has some  swelling in her feet.  Advised the patient to elevate her feet when resting Provided assistance with obtaining home blood pressure monitor via discussed with the patient about calling her insurance to see if they had a catalog that she could order a bp monitor if not get back with me and we can see about getting her one.;  Discussed plans with patient for ongoing care management follow up and provided patient with direct contact information for care management team; Advised patient, providing education and rationale, to monitor blood pressure daily and record, calling PCP for findings outside established parameters;  Reviewed scheduled/upcoming provider appointments including:  Social Worker 9/19 , Dr Owens Shark 10/3,  Will send the patient a Calendar and HTN booklet.-patient has the material 03/22/21: I spoke with Mrs. Foister today she seems to be in a fair mood. She has been checking her blood pressure, and it has been high. The readings have been at random times 161/77, 206/80 on the 25, and 182/83 on the 28th. I reviewed with her when and how to take her blood pressure, and she verbalized understanding. She denies any chest pain or flushing but states that she gets a headache every once in a while and takes one of her enalapril 20 mg. She said that she discussed it with Dr. Owens Shark. She explained that she is wearing a cardiac monitor and is unhappy about it but understands why she must wear it. She finds it annoying. We discussed the importance of the monitor and the information the monitor and Echo will provide her physicians to provide her with the appropriate care.  Mrs. Alfred also informed me that she has trouble getting to the grocery store and pharmacy .  I placed a referral to the care guides to offer her some assistance   Patient Goals/Self-Care Activities: Patient will self administer medications as prescribed Patient will attend all scheduled provider appointments Patient will call pharmacy for medication refills Patient will continue to perform ADL's independently Patient will continue to perform IADL's independently Patient will call provider office for new concerns or questions       Lazaro Arms RN, BSN, [facility] Care Management Coordinator [place] Phone: [phone] I Fax: [phone]

## 2021-03-23 DIAGNOSIS — S2221XD Fracture of manubrium, subsequent encounter for fracture with routine healing: Secondary | ICD-10-CM | POA: Diagnosis not present

## 2021-03-24 ENCOUNTER — Telehealth: Payer: Self-pay | Admitting: *Deleted

## 2021-03-24 NOTE — Telephone Encounter (Signed)
   Telephone encounter was:  Unsuccessful.  03/24/2021 Name: Nate Perri MRN: [id number] DOB: Feb 24, 1946  Unsuccessful outbound call made today to assist with:  Transportation Needs   Outreach Attempt:  1st Attempt  A HIPAA compliant voice message was left requesting a return call.  Instructed patient to call back at   Instructed patient to call back at [phone]  at their earliest convenience. .  [place], Care Management  [phone] [address] , [place] [address] Email : [email or web address]

## 2021-03-25 ENCOUNTER — Telehealth: Payer: PPO

## 2021-03-26 ENCOUNTER — Encounter: Payer: PPO | Admitting: Physical Medicine & Rehabilitation

## 2021-03-26 ENCOUNTER — Telehealth: Payer: Self-pay

## 2021-03-26 ENCOUNTER — Telehealth: Payer: Self-pay | Admitting: *Deleted

## 2021-03-26 NOTE — Telephone Encounter (Signed)
Patient calls nurse line regarding not being able to go to appointment today with Dr.Kirsteins.   Patient reports that [facility] canceled her ride and she was unable to find alternate transportation.   FYI to PCP.   Talbot Grumbling, RN

## 2021-03-26 NOTE — Telephone Encounter (Signed)
Called patient back. Reports she fell over purse. Has been able to walk. No LOC. Did not hit head. Scheduled for visit with Dr. Larae Grooms on Tuesday--sees Dr. Valentina Lucks at 830 AM that day.  Dorris Singh, MD  Family Medicine Teaching Service

## 2021-03-26 NOTE — Telephone Encounter (Signed)
   Telephone encounter was:  Successful.  03/26/2021 Name: Lauren Craig MRN: [id number] DOB: Jun 07, 1945  Lauren Craig is a 75 y.o. year old female who is a primary care patient of Martyn Malay, MD . The community resource team was consulted for assistance with Transportation Needs Patient has Rcats . provided her with information on senior services in [place] so that she could reach out about some of there programimg   Care guide performed the following interventions: Patient provided with information about care guide support team and interviewed to confirm resource needs Follow up call placed to community resources to determine status of patients referral.  Follow Up Plan:  No further follow up planned at this time. The patient has been provided with needed resources.  [place], Care Management  [phone] [address] , [place] [address] Email : [email or web address]

## 2021-03-30 ENCOUNTER — Ambulatory Visit (HOSPITAL_BASED_OUTPATIENT_CLINIC_OR_DEPARTMENT_OTHER): Payer: No Typology Code available for payment source

## 2021-03-30 ENCOUNTER — Ambulatory Visit
Admission: RE | Admit: 2021-03-30 | Discharge: 2021-03-30 | Disposition: A | Discharge status: Home / Self Care | Payer: PPO | Source: Ambulatory Visit | Attending: Family Medicine | Admitting: Family Medicine

## 2021-03-30 ENCOUNTER — Ambulatory Visit (INDEPENDENT_AMBULATORY_CARE_PROVIDER_SITE_OTHER): Payer: PPO | Admitting: Pharmacist

## 2021-03-30 ENCOUNTER — Other Ambulatory Visit: Payer: Self-pay

## 2021-03-30 ENCOUNTER — Ambulatory Visit (INDEPENDENT_AMBULATORY_CARE_PROVIDER_SITE_OTHER): Payer: PPO | Admitting: Family Medicine

## 2021-03-30 VITALS — BP 168/68 | HR 59 | Ht 62.0 in | Wt 162.0 lb

## 2021-03-30 DIAGNOSIS — M25462 Effusion, left knee: Secondary | ICD-10-CM

## 2021-03-30 DIAGNOSIS — M7989 Other specified soft tissue disorders: Secondary | ICD-10-CM | POA: Diagnosis not present

## 2021-03-30 DIAGNOSIS — E118 Type 2 diabetes mellitus with unspecified complications: Secondary | ICD-10-CM

## 2021-03-30 DIAGNOSIS — M25562 Pain in left knee: Secondary | ICD-10-CM

## 2021-03-30 DIAGNOSIS — S8992XA Unspecified injury of left lower leg, initial encounter: Secondary | ICD-10-CM | POA: Diagnosis not present

## 2021-03-30 DIAGNOSIS — I10 Essential (primary) hypertension: Secondary | ICD-10-CM

## 2021-03-30 DIAGNOSIS — Z794 Long term (current) use of insulin: Secondary | ICD-10-CM

## 2021-03-30 IMAGING — CR DG KNEE COMPLETE 4+V*L*
4 series · 4 of 4 positions shown · non-contrast
Comparison: The knee radiograph dated [DATE].

CLINICAL DATA: Left knee injury and pain.

EXAM:
LEFT KNEE - COMPLETE 4+ VIEW

[w knee ap left]
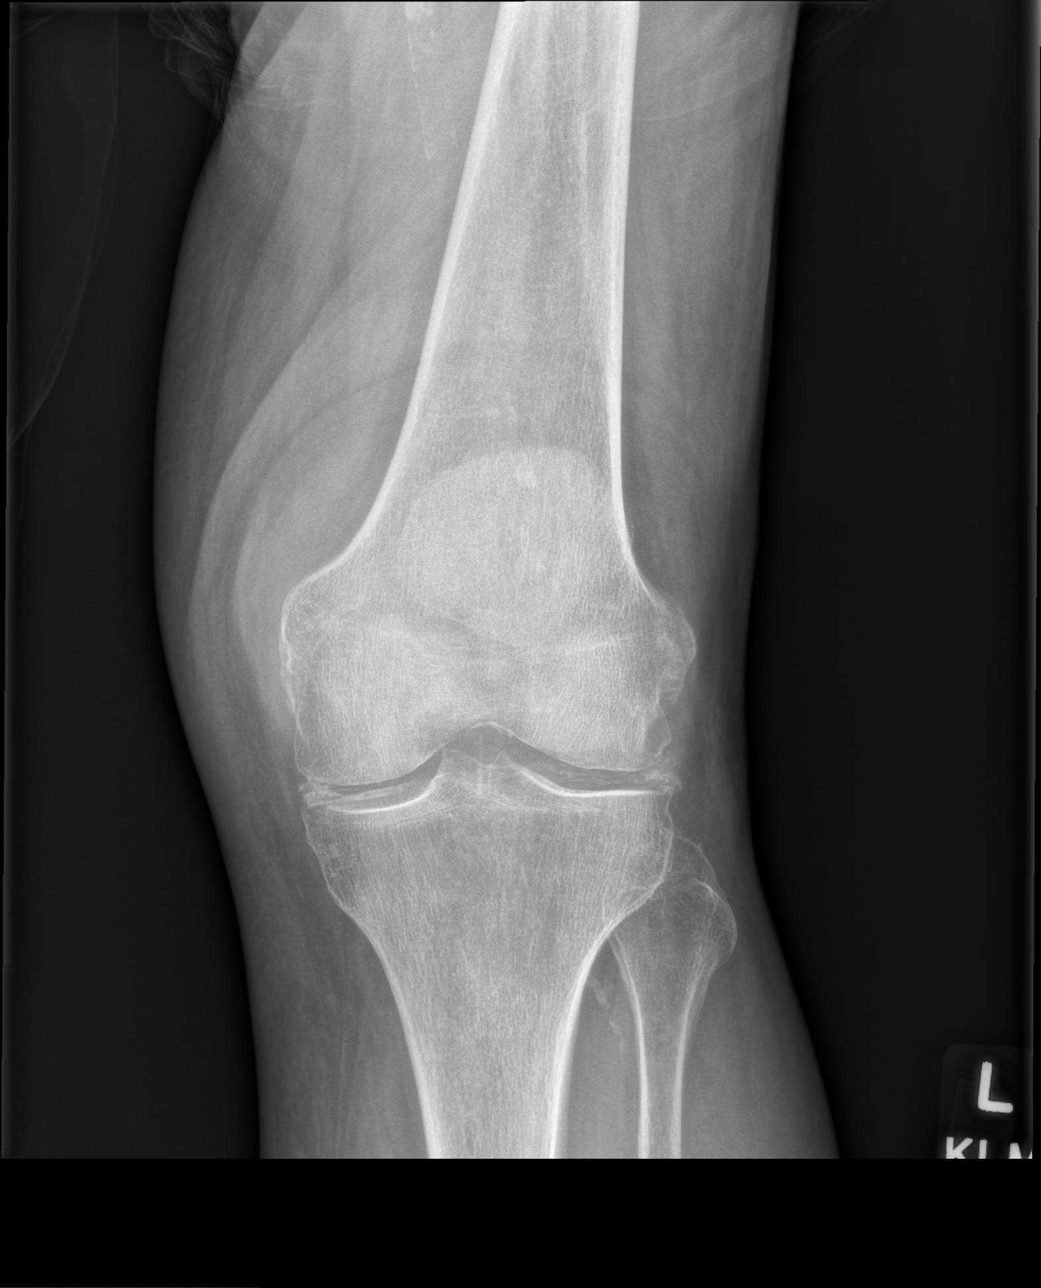

[w knee lat left]
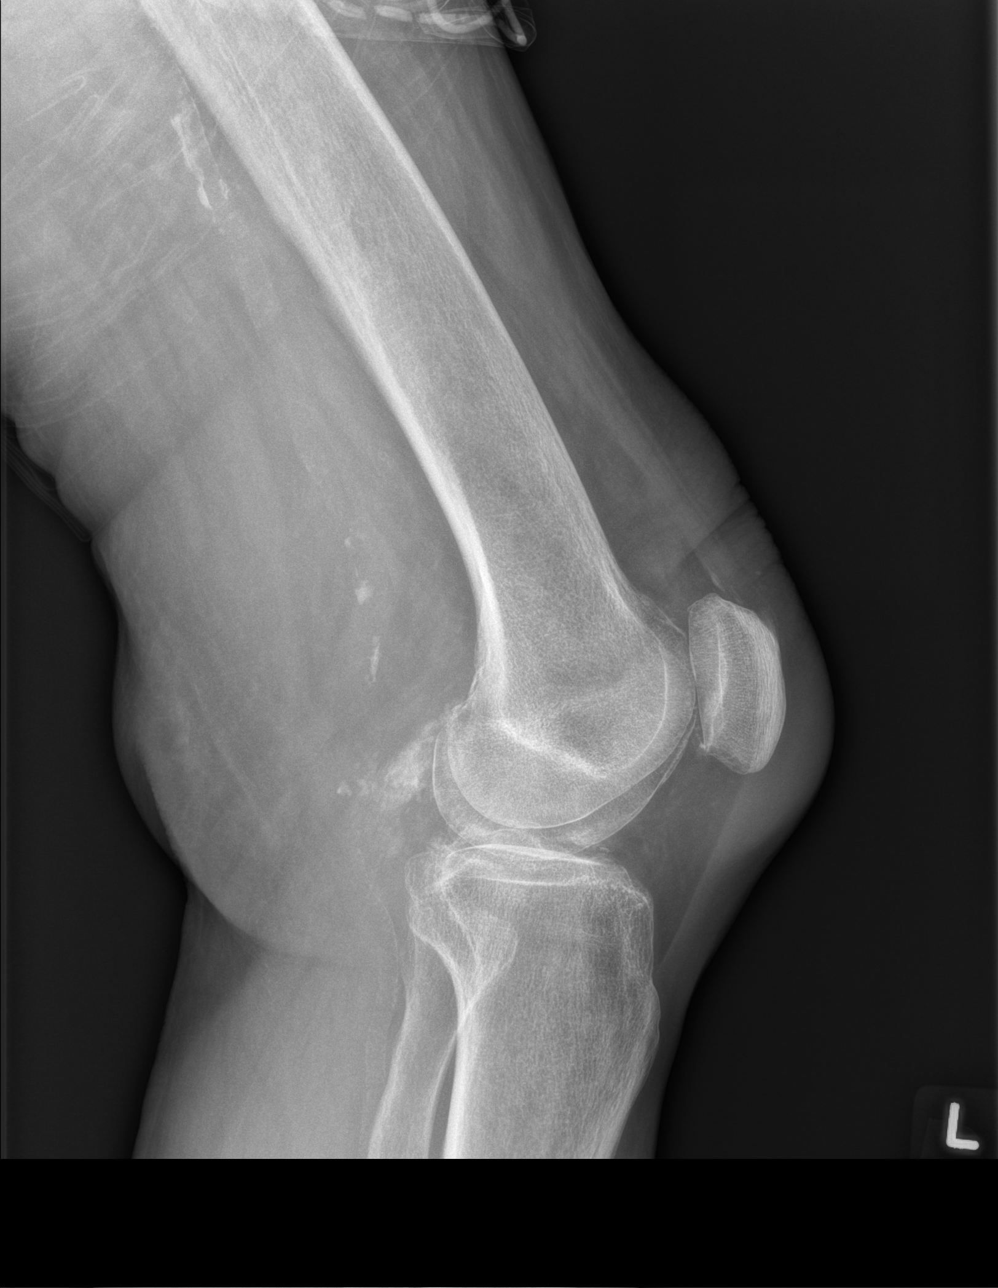

[w knee tunnel pa left]
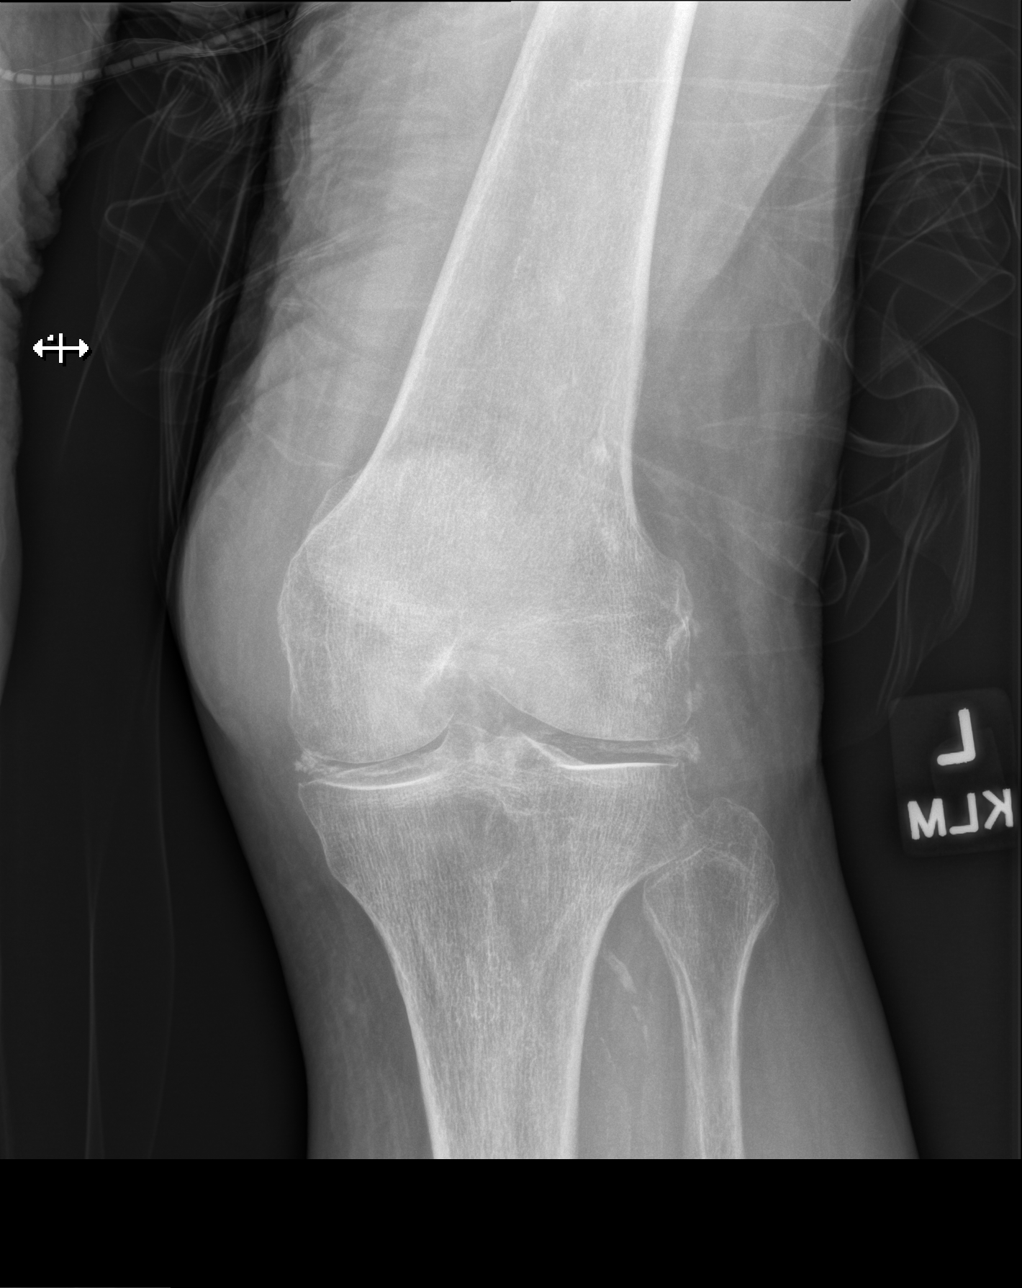

[x knee sunrise left]
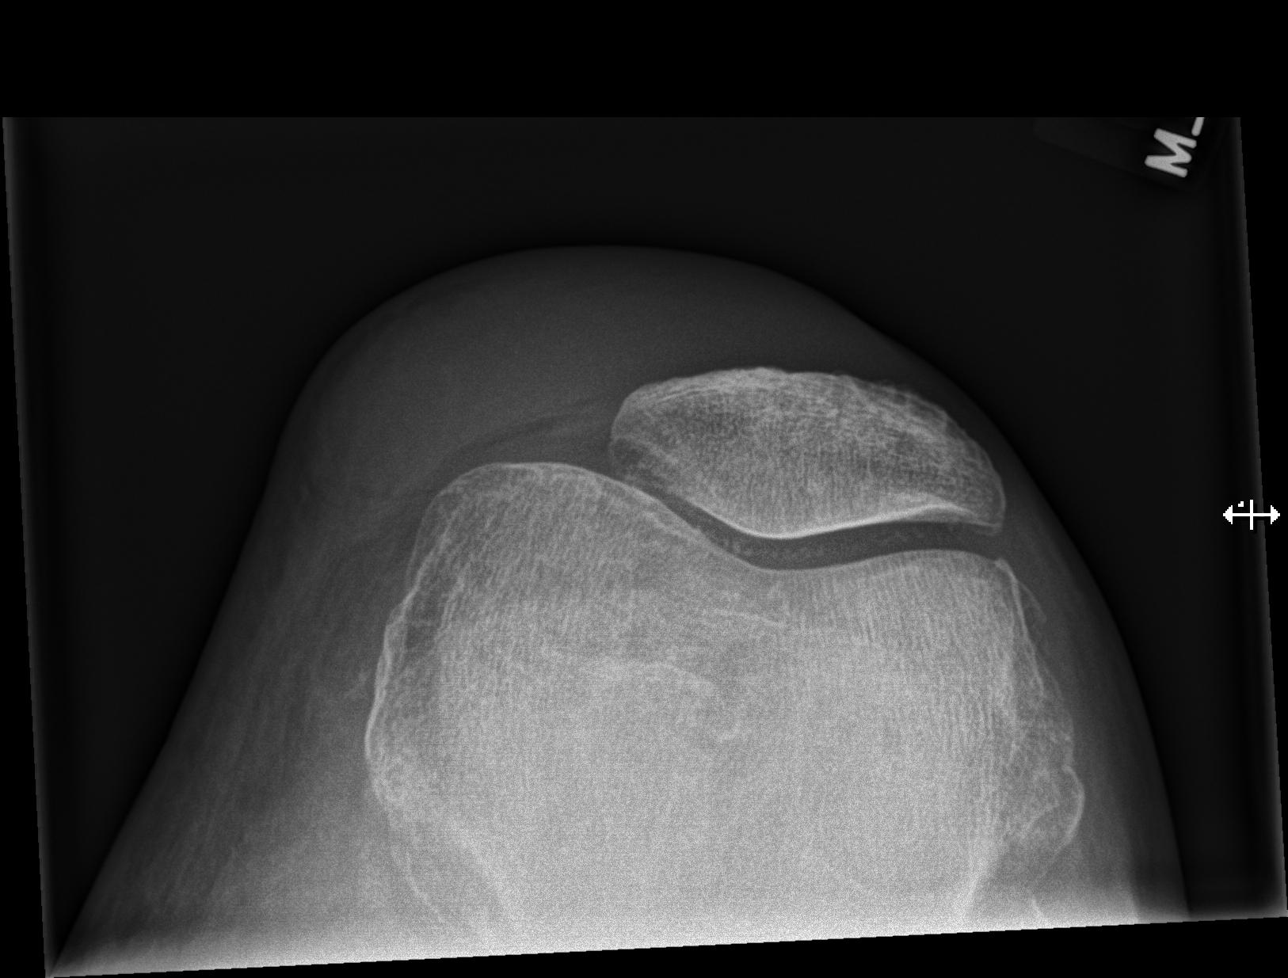

[4 of 4 positions shown; findings below may reference images not displayed]

FINDINGS: There is no acute fracture or dislocation. The bones are osteopenic.
Arthritic changes of the knee with mild tricompartmental narrowing.
There is meniscal chondrocalcinosis. No significant joint effusion.
Soft tissue swelling over the knee.
IMPRESSION: No acute fracture or dislocation.

## 2021-03-30 NOTE — Assessment & Plan Note (Signed)
-  BP 168/68, seems to be improved compared to previous visits -had visit with Dr. Valentina Lucks today for 24 hour ambulatory BP monitoring, follow up tomorrow -continue current anti-hypertensive regimen -encouraged to maintain BP log at home -diet and exercise counseling briefly discussed  -encouraged to follow up with PCP as appropriate for BP control

## 2021-03-30 NOTE — Progress Notes (Signed)
   S:    Patient arrives in a pleasant mood with her daughter, ambulating with a cane. Presents to the clinic for ambulatory blood pressure evaluation.  Patient was referred and last seen by Primary Care Provider Dr. Owens Shark on 03/01/2021.   Medication compliance is reported to be good.  Discussed procedure for wearing the monitor and gave patient written instructions. Monitor was placed on non-dominant (left) arm with instructions to return in the morning.   Patient reports blood pressures at home in the 150s-190s /60s. She denies feeling lightheaded or experiencing orthostatic hypotension. She does report occasional headaches.   Current BP Medications include:  amlodipine 10 mg daily, carvedilol 6.25 mg BID, enalapril 20 mg daily, indapamide 2.5 mg daily  She returned to the office 03/31/2021 at 9:40 AM and shared that she was unable to wear the monitor for 24 hours due to arm pain and leg pain that was severe.  She reported pain level of 7/10 during visit.    O:  Physical Exam Constitutional:      Appearance: Normal appearance.  Neurological:     Mental Status: She is alert.    ROS  Last 3 Office BP readings: BP Readings from Last 3 Encounters:  03/18/21 (!) 192/70  03/11/21 (!) 144/66  03/01/21 (!) 162/70     Clinical Atherosclerotic Cardiovascular Disease (ASCVD): Yes  (hx of stroke on 06/2020) The ASCVD Risk score (Arnett DK, et al., 2019) failed to calculate for the following reasons:   The patient has a prior MI or stroke diagnosis  Basic Metabolic Panel    Component Value Date/Time   NA 143 03/11/2021 1140   K 5.2 03/11/2021 1140   CL 109 (H) 03/11/2021 1140   CO2 21 03/11/2021 1140   GLUCOSE 129 (H) 03/11/2021 1140   GLUCOSE 141 (H) 01/04/2021 0652   BUN 34 (H) 03/11/2021 1140   CREATININE 1.14 (H) 03/11/2021 1140   CALCIUM 9.8 03/11/2021 1140   GFRNONAA 37 (L) 01/04/2021 0652   GFRAA >60 11/11/2019 1200    Renal function: Estimated Creatinine Clearance:  38.9 mL/min (A) (by C-G formula based on SCr of 1.14 mg/dL (H)).  Today's Office Blood Pressure (BP) reading: 168/68 mmHg (monitor reading)  ABPM Study Data: Arm Placement left arm  Overall Mean 24hr BP:   157/66 mmHg HR: 54  Daytime Mean BP:  157/66 mmHg HR: 54  Nighttime Mean BP:  Did not wear during night.   A/P: History of hypertension since 2019 found to have isolated systolic hypertension given 24-hour ambulatory blood pressure demonstrates systolic readings of 382-505 with diastolic readings ~ 39-76 with an average awake blood pressure of 157/66.  She did not wear monitor while sleeping.   Changes to medications were discussed with PCP, Dr. Owens Shark.  -initiate Jardiance (empagliflozin) 10mg  daily in this patient with history of diabetes and kidney dysfunction.  Patient educated on purpose, proper use and potential adverse effects of infection.  Following instruction patient verbalized understanding of treatment plan.    Leg pain follow-up with PCP, Dr. Owens Shark.  Patient was seen by PCP during visit.     Results reviewed and written information provided.  Total time in face-to-face counseling 28 minutes.   F/U Clinic Visit with Dr. Owens Shark within the next few weeks. .  Patient seen with Elyse Jarvis, PharmD Candidate.

## 2021-03-30 NOTE — Patient Instructions (Addendum)
  Start Jariance (empagliflozin) 10mg  once daily in the morning.   Follow-up with Dr. Owens Shark later this month.

## 2021-03-30 NOTE — Progress Notes (Signed)
     SUBJECTIVE:   CHIEF COMPLAINT / HPI:   Knee injury  Patient presents with her daughter after recent knee injury about a week ago. Reports that her foot got caught in her purse strap and she fell forward while landing on her knee. Initially was a 10/10 pain as all the pressure localized to her knee. Kept an ice pack on it initially, pain was significant. Was able to tolerate the pain with a few tylenol overt he past few days. Pain has improved significantly, now rates pain as 4/10. Placed an bandage during sleep. Does not have falls typically, this is the first one. No other trauma other than the knee, says that she no longer leaves her purse on the floor so she does not have to experience an accident like this again. Still able to care for herself independently although going up steps takes a little longer. Denies head trauma or loss of consciousness. Shares that her range of motion has improved as well.  Denies fever, chills, nausea, vomiting or other symptoms.   PERTINENT  PMH / PSH:  Hypertension Compliant on amlodipine, coreg and enalparil initially. Had visit with Dr. Valentina Lucks for ambulatory BP monitoring, he recommended to discontinue enalapril per patient. Checks BP at home, systolic BP ranges between 160-180. Taking amlodipine and coreg. 24 hour ambulatory monitoring placed per Dr. Valentina Lucks.    OBJECTIVE:   BP (!) 168/68   Pulse (!) 59   Ht 5\' 2"  (1.575 m)   Wt 162 lb (73.5 kg)   BMI 29.63 kg/m    General: Patient sitting comfortably, in no acute distress. CV: RRR, no murmurs or gallops auscultated Resp: CTAB Ext: erythema or edema noted along left patella compared to right, normal passive ROM of knees and ankles bilaterally, no rash noted, not warm to touch, no drainage noted, mildly tender on palpation of left patella, negative right and left anterior drawer testing  Neuro: normal gait with some support at times Psych: mood appropriate      ASSESSMENT/PLAN:   Swelling of  joint of left knee -likely bursitis, very low concern for septic joint given history and physical exam -very low concern for fracture or dislocation, but will order x-ray of left knee, encouraged patient to get this at her earliest convenience  -RICE therapy discussed  -tylenol and ibuprofen for pain as appropriate  -follow up in 2 weeks to ensure resolution    HTN (hypertension) -BP 168/68, seems to be improved compared to previous visits -had visit with Dr. Valentina Lucks today for 24 hour ambulatory BP monitoring, follow up tomorrow -continue current anti-hypertensive regimen -encouraged to maintain BP log at home -diet and exercise counseling briefly discussed  -encouraged to follow up with PCP as appropriate for BP control      Vernon Ariel Larae Grooms, [place]

## 2021-03-30 NOTE — Assessment & Plan Note (Signed)
-  likely bursitis, very low concern for septic joint given history and physical exam -very low concern for fracture or dislocation, but will order x-ray of left knee, encouraged patient to get this at her earliest [place] therapy discussed  -tylenol and ibuprofen for pain as appropriate  -follow up in 2 weeks to ensure resolution

## 2021-03-30 NOTE — Patient Instructions (Addendum)
It was great seeing you today!  I am so sorry that you had a knee injury. I am pleased to see that it has improved since when it started and also glad to know that it has not interfered so far. Please continue to rest the area, elevate the left leg and apply ice to the area to help the swelling go down. Compressing the area by wrapping it in a elastic bandage can help to stabilize the area but I believe the swelling is improving and should continue to improve.   I have ordered imaging of the left knee, please get this done at your earliest convenience.   Please follow up at your next scheduled appointment in 2 weeks, if anything arises between now and then, please don't hesitate to contact our office.   Thank you for allowing [place] to be a part of your medical care!  Thank you, Dr. Larae Grooms

## 2021-03-31 ENCOUNTER — Encounter: Payer: Self-pay | Admitting: Pharmacist

## 2021-03-31 ENCOUNTER — Ambulatory Visit: Payer: PPO | Admitting: Licensed Clinical Social Worker

## 2021-03-31 MED ORDER — EMPAGLIFLOZIN 10 MG PO TABS: 10.0000 mg | ORAL_TABLET | Freq: Every day | ORAL | 3 refills | Status: DC

## 2021-03-31 NOTE — Progress Notes (Signed)
Reviewed: I agree with Dr. Koval's documentation and management. 

## 2021-03-31 NOTE — Patient Instructions (Addendum)
   It was a pleasure speaking with you today. I am sorry you were unable to keep your phone appointment today.   per your request your appointment is scheduled 04/01/2021  Casimer Lanius, [place] Management & Coordination  [phone]

## 2021-03-31 NOTE — Chronic Care Management (AMB) (Signed)
    Clinical Social Work  Care Management   Phone Outreach    03/31/2021 Name: Lauren Craig MRN: [id number] DOB: Feb 14, 1946  Lauren Craig is a 75 y.o. year old female who is a primary care patient of Martyn Malay, MD .   Reason for referral:  Connect for ongoing counseling .    F/U phone call today to assess needs, progress and barriers with care plan goals.   Unable to keep phone appointment today and requested to reschedule.  Plan:Appointment was rescheduled with CCM 03/31/2021  Review of patient status, including review of consultants reports, relevant laboratory and other test results, and collaboration with appropriate care team members and the patient's provider was performed as part of comprehensive patient evaluation and provision of care management services.    Casimer Lanius, [place] / [place]   [phone]

## 2021-03-31 NOTE — Assessment & Plan Note (Signed)
History of hypertension since 2019 found to have isolated systolic hypertension given 24-hour ambulatory blood pressure demonstrates systolic readings of 375-436 with diastolic readings ~ 06-77 with an average awake blood pressure of 157/66.  She did not wear monitor while sleeping.   Changes to medications were discussed with PCP, Dr. Owens Shark.  -initiate Jardiance (empagliflozin) 10mg  daily in this patient with history of diabetes and kidney dysfunction.  Patient educated on purpose, proper use and potential adverse effects of infection.  Following instruction patient verbalized understanding of treatment plan.

## 2021-04-01 ENCOUNTER — Ambulatory Visit: Payer: PPO | Admitting: Licensed Clinical Social Worker

## 2021-04-01 DIAGNOSIS — Z7189 Other specified counseling: Secondary | ICD-10-CM

## 2021-04-01 NOTE — Patient Instructions (Addendum)
Licensed Clinical Social Worker Visit Information  Goals we discussed today:   Goals Addressed             This Visit's Progress    Coping Skills Enhanced by connecting for counseling       Patient Goals/Self-Care Activities:  Call [employer] [phone]  Keep appointment with [facility] for counseling  I have placed the referral for the CAPS program, look for an application in the mail in the next 3 weeks        Patient agreed to services and verbal consent obtained.   Patient verbalizes understanding of instructions provided today and agrees to view in [employer].   Follow up plan: Appointment scheduled for SW follow up with client by phone on: 04/28/2021 It was a pleasure speaking with you today. Please call the office if needed  Casimer Lanius, [place] Management & Coordination  [phone]

## 2021-04-01 NOTE — Chronic Care Management (AMB) (Signed)
Care Management  Clinical Social Work Note  04/01/2021 Name: Lauren Craig MRN: [id number] DOB: 1945/08/25  Lauren Craig is a 75 y.o. year old female who is a primary care patient of Lauren Malay, MD. The CCM team was consulted for assistance with care coordination needs. Level of Care Concerns and connecting for counseling    Consent to Services:  The patient was given information about Care Management services, agreed to services, and gave verbal consent prior to initiation of services.  Please see initial visit note for detailed documentation.   Patient agreed to services today and consent obtained.  Engaged with patient by telephone for follow up visit in response to provider referral for social work care coordination services.   Assessment/Interventions: Assessed patient's current treatment, progress, coping skills, support system and barriers to care.  She is making progress with connecting for counseling, has appointment scheduled Nov. 28 th with Lauren Craig . Reports her friend is setting up her medication. Discussed support options with PCP. See Care Plan below for interventions and patient self-care actives.  Recent life changes or stressors: continue decline in health  Recommendation: Patient may benefit from, and is in agreement for LCSW to make referral to Lauren Craig.  Patient understands this is along process and will not provide short term support for her.   Follow up Plan: Patient would like continued follow-up from CCM LCSW .  per patient's request will follow up in 4 weeks.  Will call office if needed prior to next encounter.   Review of patient past medical history, allergies, medications, and health status, including review of pertinent consultant reports was performed as part of comprehensive evaluation and provision of care management/care coordination services.   SDOH (Social Determinants of Health) screening and interventions performed today:    Advanced Directives Status:Not addressed in this encounter.     Care Plan    Conditions to be addressed/monitored per PCP order: Depression,   Care Plan : General Social Work (Adult)  Updates made by Lauren Cane, LCSW since 04/01/2021 12:00 AM     Problem: Emotional Distress      Goal: Emotional Health Supported by connecting with therapy   Start Date: 02/15/2021  This Visit's Progress: On track  Recent Progress: Not on track  Priority: High  Note:   Current barriers:   Acute Mental Health needs related to symptoms of stresss  Limited access to caregiver Needs Support, Education, and Care Coordination in order to meet unmet mental health needs. Clinical Goal(s): patient will work with counselor to address needs related to managing stress   Clinical Interventions:  Solution-Focused Strategies employed: with barriers to connect for counseling Active listening / Reflection utilized  Emotional Support Provided Problem Lauren Craig strategies reviewed ; Review various resources, discussed options and provided patient information about  Discussed PACE Craig ( patient declined  Discussed CAPS Craig, Collaborated with CAPS referral coordinator  Options for mental health treatment based on need and insurance Collaborated with [place] to assist with patient's barriers to care Inter-disciplinary care team collaboration (see longitudinal plan of care) Patient Goals/Self-Care Activities: Over the next 30 days Call Lauren Craig Transportation [phone]  Keep appointment with Lauren Craig for counseling  I have placed the referral for the CAPS Craig, look for an application in the mail in the next 3 weeks    Lauren Craig, [place] / [place]   [phone]

## 2021-04-01 NOTE — Progress Notes (Signed)
Patient interviewed and examined. Knee exam---TTP along medial aspect of patella on L. Erythema improving, appears to be developing ecchymoses/bruising. Good ROM, using assistive device for ambulation X-ray reviewed Agree with ice, compression, Tylenol Reviewed return precautions  Dorris Singh, MD  Family Medicine Teaching Service

## 2021-04-02 ENCOUNTER — Other Ambulatory Visit: Payer: PPO

## 2021-04-06 ENCOUNTER — Other Ambulatory Visit (HOSPITAL_BASED_OUTPATIENT_CLINIC_OR_DEPARTMENT_OTHER): Payer: PPO

## 2021-04-20 ENCOUNTER — Telehealth: Payer: PPO

## 2021-04-21 ENCOUNTER — Ambulatory Visit: Payer: PPO

## 2021-04-21 NOTE — Chronic Care Management (AMB) (Signed)
Chronic Care Management   CCM RN Visit Note  04/21/2021 Name: Lauren Craig MRN: [id number] DOB: 1945-09-14  Subjective: Lauren Craig is a 75 y.o. year old female who is a primary care patient of Martyn Malay, MD. The care management team was consulted for assistance with disease management and care coordination needs.    Engaged with patient by telephone for follow up visit in response to provider referral for case management and/or care coordination services.   Consent to Services:  The patient was given information about Chronic Care Management services, agreed to services, and gave verbal consent prior to initiation of services.  Please see initial visit note for detailed documentation.   Patient agreed to services and verbal consent obtained.    Assessment:  The patient is currently not checking her Blood pressures. . See Care Plan below for interventions and patient self-care actives. Follow up Plan: Patient would like continued follow-up.  CCM RNCM will outreach the patient within the next 4 weeks.  Patient will call office if needed prior to next encounter  Review of patient past medical history, allergies, medications, health status, including review of consultants reports, laboratory and other test data, was performed as part of comprehensive evaluation and provision of chronic care management services.   SDOH (Social Determinants of Health) assessments and interventions performed:    CCM Care Plan  Allergies  Allergen Reactions   Penicillins Hives, Rash and Other (See Comments)    Did it involve swelling of the face/tongue/throat, SOB, or low BP? Yes Did it involve sudden or severe rash/hives, skin peeling, or any reaction on the inside of your mouth or nose? No Did you need to seek medical attention at a hospital or doctor's office? Yes When did it last happen? ? If all above answers are "NO", may proceed with cephalosporin use.    Insulin Lispro Prot & Lispro  Other (See Comments)    Site of injection was inflamed, red, painful episode   Semaglutide Nausea And Vomiting   Metformin Diarrhea    Outpatient Encounter Medications as of 04/21/2021  Medication Sig Note   acetaminophen (TYLENOL) 325 MG tablet Take 2 tablets (650 mg total) by mouth every 6 (six) hours as needed for headache. (Patient not taking: Reported on 03/30/2021) 03/30/2021: Uses for pain PRN - takes two at a time.    albuterol (VENTOLIN HFA) 108 (90 Base) MCG/ACT inhaler Inhale 2 puffs into the lungs every 6 (six) hours as needed for wheezing or shortness of breath. 03/30/2021: 2 puffs each morning and PRN in the evening.    amLODipine (NORVASC) 10 MG tablet Take 1 tablet (10 mg total) by mouth daily.    aspirin EC 81 MG tablet Take 1 tablet (81 mg total) by mouth daily. Swallow whole.    atorvastatin (LIPITOR) 80 MG tablet Take 1 tablet (80 mg total) by mouth at bedtime.    carvedilol (COREG) 6.25 MG tablet Take 6.25 mg by mouth daily.    clopidogrel (PLAVIX) 75 MG tablet Take 75 mg by mouth daily.    empagliflozin (JARDIANCE) 10 MG TABS tablet Take 1 tablet (10 mg total) by mouth daily.    enalapril (VASOTEC) 20 MG tablet Take 20 mg by mouth daily.    ezetimibe (ZETIA) 10 MG tablet Take 1 tablet (10 mg total) by mouth daily.    famotidine (PEPCID) 10 MG tablet Take 1 tablet (10 mg total) by mouth daily. (Patient not taking: No sig reported)    fluticasone (FLONASE) 50  MCG/ACT nasal spray Place 2 sprays into both nostrils daily.    indapamide (LOZOL) 2.5 MG tablet Take 1 tablet (2.5 mg total) by mouth daily.    nystatin cream (MYCOSTATIN) Apply 1 application topically 2 (two) times daily.    OVER THE COUNTER MEDICATION Take 1 tablet by mouth daily. Prevagen - once daily    pantoprazole (PROTONIX) 40 MG tablet Take 40 mg by mouth daily.    sertraline (ZOLOFT) 25 MG tablet Take 1 tablet (25 mg total) by mouth daily.    sucralfate (CARAFATE) 1 g tablet Take 1 tablet (1 g total) by mouth  daily before supper.    triamcinolone ointment (KENALOG) 0.1 % Apply 1 application topically 2 (two) times daily.    No facility-administered encounter medications on file as of 04/21/2021.    Patient Active Problem List   Diagnosis Date Noted   Swelling of joint of left knee 03/30/2021   Anxiety disorder 03/15/2021   Arthritis 03/15/2021   C. 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   History of Clostridioides difficile colitis 12/03/2020   History of osteomyelitis 12/03/2020   Mixed diabetic hyperlipidemia associated with type 2 diabetes mellitus ([place]) 12/03/2020   Hypertension associated with type 2 diabetes mellitus ([place]) 12/03/2020   Type 2 diabetes mellitus with complication, with long-term current use of insulin ([place]) 12/03/2020   Anemia 11/19/2020   Peripheral edema 11/19/2020   Visual field defect 11/08/2020   Cerebrovascular disease 11/08/2020   Postoperative examination 02/20/2020   Calculus of gallbladder with chronic 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 01/23/2020   Closed fracture of fifth metatarsal bone 10/11/2019   Pain in left foot 10/11/2019   HTN (hypertension) 10/03/2017   Hyperlipidemia 10/03/2017   Controlled diabetes mellitus type 2 with complications ([place]) 81/77/1165   PVD (peripheral vascular disease) ([place]) 2010   Vitamin B12 deficiency 2002    Conditions to be addressed/monitored:HTN  Care Plan : RN Care Manager  Updates made by Lazaro Arms, RN since 04/21/2021 12:00 AM     Problem: Hypertension      Goal: The patient wil monitor and maintain her BP within given range   Start  Date: 02/10/2021  Expected End Date: 03/29/2021  Priority: High  Note:   Current Barriers:  Knowledge Deficits related to plan of care for management of HTN  Chronic Disease Management support and education needs related to HTN  RNCM Clinical Goal(s):  Patient will verbalize understanding of plan for management of HTN through collaboration with RN Care manager, provider, and care team.   Interventions: 1:1 collaboration with primary care provider regarding development and update of comprehensive plan of care as evidenced by provider attestation and co-signature Inter-disciplinary care team collaboration (see longitudinal plan of care) Evaluation of current treatment plan related to  self management and patient's adherence to plan as established by provider   Hypertension: (Status: New goal.) Last practice recorded BP readings:  BP Readings from Last 3 Encounters:  03/30/21 (!) 168/68  03/30/21 (!) 168/68  03/18/21 (!) 192/70  Most recent eGFR/CrCl:  Lab Results  Component Value Date   EGFR 50 (L) 03/11/2021    No components found for: CRCL  Evaluation  of current treatment plan related to hypertension self management and patient's adherence to plan as established by provider;   Provided education to patient re: stroke prevention, s/s of heart attack and stroke; Reviewed prescribed diet low sodium Reviewed medications with patient and discussed importance of compliance;  Patient denies any chest pain shortness of breath Discussed plans with patient for ongoing care management follow up and provided patient with direct contact information for care management team; Advised patient, providing education and rationale, to monitor blood pressure daily and record, calling PCP for findings outside established parameters;  04/21/21: Lauren Craig is doing well. She is at home with her daughter, cooking for Thanksgiving dinner for tomorrow. She stated that she had a fall where she tripped over  her purse strap and hit her left knee; it was red and swollen, but no fracture. She has been icing it and using rubs. Lauren Craig has been able to walk on it today with no pain and clean her nome. She has not been checking her blood pressure because there is so much for her to do now. She denies any headaches, shortness of breath, chest pain, or flushing. I reviewed with her all the appointments that she has in December. 04/29/21 Dr. Bettina Gavia 140 pm, 04/30/21 Dr. Owens Shark 950 am, 05/24/21 Dr Dellis Anes am.  She has them written down.   Patient Goals/Self Care Activities: -Patient/Caregiver will self-administer medications as prescribed as evidenced by self-report/primary caregiver report ,  -Patient/Caregiver will attend all scheduled provider appointments as evidenced by clinician review of documented attendance to scheduled appointments and patient/caregiver report,  -Patient/Caregiver will call pharmacy for medication refills as evidenced by patient report and review of pharmacy fill history as appropriate,  -Patient/Caregiver will call provider office for new concerns or questions as evidenced by review of documented incoming telephone call notes and patient report, Patient/Caregiver verbalizes understanding of plan, -Patient/Caregiver will focus on medication adherence by taking all medications as prescribed -Calls provider office for new concerns, questions, or BP outside discussed parameters -Checks BP and records as discussed -Follows a low sodium diet/DASH diet      Lazaro Arms RN, BSN, [place] Management Coordinator [place] Phone: [phone] I Fax: [phone]

## 2021-04-21 NOTE — Patient Instructions (Signed)
Visit Information  Ms. Braun  it was nice speaking with you. Please call me directly [phone] if you have questions about the goals we discussed.    Patient Goals/Self Care Activities: -Patient/Caregiver will self-administer medications as prescribed as evidenced by self-report/primary caregiver report ,  -Patient/Caregiver will attend all scheduled provider appointments as evidenced by clinician review of documented attendance to scheduled appointments and patient/caregiver report,  -Patient/Caregiver will call pharmacy for medication refills as evidenced by patient report and review of pharmacy fill history as appropriate,  -Patient/Caregiver will call provider office for new concerns or questions as evidenced by review of documented incoming telephone call notes and patient report, Patient/Caregiver verbalizes understanding of plan, -Patient/Caregiver will focus on medication adherence by taking all medications as prescribed -Calls provider office for new concerns, questions, or BP outside discussed parameters -Checks BP and records as discussed -Follows a low sodium diet/DASH diet      The patient verbalized understanding of instructions, educational materials, and care plan provided today and declined offer to receive copy of patient instructions, educational materials, and care plan.   Follow up Plan: Patient would like continued follow-up.  CCM RNCM will outreach the patient within the next 4 weeks.  Patient will call office if needed prior to next encounter  Lazaro Arms, RN  [phone]

## 2021-04-26 ENCOUNTER — Ambulatory Visit (INDEPENDENT_AMBULATORY_CARE_PROVIDER_SITE_OTHER): Payer: PPO | Admitting: Psychologist

## 2021-04-26 DIAGNOSIS — F320 Major depressive disorder, single episode, mild: Secondary | ICD-10-CM

## 2021-04-26 DIAGNOSIS — F411 Generalized anxiety disorder: Secondary | ICD-10-CM

## 2021-04-28 ENCOUNTER — Ambulatory Visit: Payer: PPO | Admitting: Licensed Clinical Social Worker

## 2021-04-28 DIAGNOSIS — F4329 Adjustment disorder with other symptoms: Secondary | ICD-10-CM

## 2021-04-28 DIAGNOSIS — F419 Anxiety disorder, unspecified: Secondary | ICD-10-CM

## 2021-04-28 NOTE — Patient Instructions (Addendum)
Visit Information  Thank you for taking time to visit with me today. Please don't hesitate to contact me if I can be of assistance to you before our next scheduled telephone appointment.  Following are the goals we discussed today: Connecting you for counseling services Call [facility] [phone]  Keep appointments with [facility] for counseling  Look for the CAPS application in the mail in the next 3 weeks  Please call the care guide team at [phone] if you need to cancel or reschedule your appointment.   If you are experiencing a Mental Health or [place] or need someone to talk to, please call the Suicide and Crisis Lifeline: 988 call the [place] National Suicide Prevention Lifeline: (219) 552-9030 or TTY: 573-386-8401 TTY 325 199 7657) to talk to a trained counselor call 1-800-273-TALK (toll free, 24 hour hotline) call 911   Patient verbalizes understanding of instructions provided today and agrees to view in [place].   Please call the office if needed No follow up scheduled, per our conversation you do not require or desire continued follow up Per our conversation I will remain part of your care team for the next 20 days.  If no needs are identified in the next 20 days, I will disconnect from the care team.  Casimer Lanius, [place] Management [facility] Coordination  [phone]

## 2021-04-28 NOTE — Chronic Care Management (AMB) (Signed)
   Care Management    Clinical Social Work Note  04/28/2021 Name: Lauren Craig MRN: [id number] DOB: 1945-11-10  Lauren Craig is a 75 y.o. year old female who is a primary care patient of Lauren Malay, MD. The CCM team was consulted to assist the patient with chronic disease management and/or care coordination needs related to: Level of Care Concerns and connect for counseling/therapy .   Engaged with patient by telephone for follow up visit in response to provider referral for social work chronic care management and care coordination services.   Consent to Services:  The patient was given information about Chronic Care Management services, agreed to services, and gave verbal consent prior to initiation of services.  Please see initial visit note for detailed documentation.   Patient agreed to services and consent obtained.   Assessment: Review of patient past medical history, allergies, medications, and health status, including review of relevant consultants reports was performed today as part of a comprehensive evaluation and provision of chronic care management and care coordination services.     SDOH (Social Determinants of Health) assessments and interventions performed:    Advanced Directives Status: Not addressed in this encounter.  CCM Care Plan  Conditions to be addressed/monitored: Anxiety;   Care Plan : General Social Work (Adult)  Updates made by Lauren Cane, LCSW since 04/28/2021 12:00 AM     Problem: Emotional Distress      Goal: Emotional Health Supported by connecting with therapy Completed 04/28/2021  Start Date: 02/15/2021  This Visit's Progress: On track  Recent Progress: On track  Priority: High  Note:   Current barriers:   Acute Mental Health needs related to symptoms of stresss  Limited access to caregiver Needs Support, Education, and Care Coordination in order to meet unmet mental health needs. Clinical Goal(s): patient will work with  counselor to address needs related to managing stress   Clinical Interventions:  Active listening / Reflection utilized  Emotional Support Provided Problem [place] strategies reviewed ; Review various resources, discussed options and provided patient information about  Discussed PACE Program ( patient declined  Discussed CAPS program, Collaborated with CAPS referral coordinator  Inter-disciplinary care team collaboration (see longitudinal plan of care) Patient Goals/Self-Care Activities:  Call [facility] [phone]  Keep appointments with [facility] for counseling  Look for the CAPS application in the mail in the next 3 weeks     Follow Up Plan: No follow up scheduled.  Patient completed goals. Reports she is able to meet ADL's and does not need assistance. Will call office if needed.  LCSW  will remain part of care team for the next 20 days, if no needs are identified LCSW will disconnect from the care team.      Lauren Craig, [place] / [place]   [phone]

## 2021-04-29 ENCOUNTER — Ambulatory Visit: Payer: PPO | Admitting: Cardiology

## 2021-04-30 ENCOUNTER — Encounter: Payer: Self-pay | Admitting: Family Medicine

## 2021-04-30 ENCOUNTER — Other Ambulatory Visit: Payer: Self-pay

## 2021-04-30 ENCOUNTER — Ambulatory Visit (INDEPENDENT_AMBULATORY_CARE_PROVIDER_SITE_OTHER): Payer: PPO | Admitting: Family Medicine

## 2021-04-30 VITALS — BP 145/60 | HR 62 | Wt 159.2 lb

## 2021-04-30 DIAGNOSIS — I10 Essential (primary) hypertension: Secondary | ICD-10-CM

## 2021-04-30 DIAGNOSIS — K209 Esophagitis, unspecified without bleeding: Secondary | ICD-10-CM

## 2021-04-30 DIAGNOSIS — I679 Cerebrovascular disease, unspecified: Secondary | ICD-10-CM

## 2021-04-30 DIAGNOSIS — R600 Localized edema: Secondary | ICD-10-CM

## 2021-04-30 DIAGNOSIS — Z780 Asymptomatic menopausal state: Secondary | ICD-10-CM

## 2021-04-30 DIAGNOSIS — E7800 Pure hypercholesterolemia, unspecified: Secondary | ICD-10-CM

## 2021-04-30 DIAGNOSIS — E118 Type 2 diabetes mellitus with unspecified complications: Secondary | ICD-10-CM

## 2021-04-30 DIAGNOSIS — R609 Edema, unspecified: Secondary | ICD-10-CM | POA: Diagnosis not present

## 2021-04-30 DIAGNOSIS — M25562 Pain in left knee: Secondary | ICD-10-CM

## 2021-04-30 DIAGNOSIS — Z23 Encounter for immunization: Secondary | ICD-10-CM

## 2021-04-30 MED ORDER — ENALAPRIL MALEATE 20 MG PO TABS: 20.0000 mg | ORAL_TABLET | Freq: Every day | ORAL | 3 refills | Status: DC

## 2021-04-30 MED ORDER — SHINGRIX 50 MCG/0.5ML IM SUSR: 0.5000 mL | INTRAMUSCULAR | 0 refills | Status: DC

## 2021-04-30 MED ORDER — TETANUS-DIPHTH-ACELL PERTUSSIS 5-2.5-18.5 LF-MCG/0.5 IM SUSP: 0.5000 mL | Freq: Once | INTRAMUSCULAR | 0 refills | Status: DC

## 2021-04-30 NOTE — Assessment & Plan Note (Signed)
Recommended follow-up with gastroenterology.  She has yet to have her colonoscopy.  Provided number to call again.  No abdominal concerns at this time.  Continue current therapy of PPI.

## 2021-04-30 NOTE — Assessment & Plan Note (Signed)
Not at goal--reports hard to take at night. Transition to AM. Repeat direct LDL today. If persistently elevated will refer to lipid clinic.

## 2021-04-30 NOTE — Assessment & Plan Note (Signed)
This is markedly improved today since her prior visit.  Her diastolic numbers for limited value in additional agent.  I do suspect her amlodipine is contributing to her lower extremity edema as described.  We will continue her current therapies for now.  Enalapril refill refilled.  BMP today. Goal for patient is less than 130/80 however her falls and diastolic pressures in the 60s limit this.

## 2021-04-30 NOTE — Patient Instructions (Addendum)
It was wonderful to see you today.  Please bring ALL of your medications with you to every visit.   Today we talked about:  HAVING A MERRY CHRISTMAS!!!  Stop aspirin per your Neurologist   I will call you with blood work  Please monitor your leg swelling  You will be called about an appointment with the Sports Medicine Doctor for your knee  Please call the Gastroenterologist [phone]  I recommend continuing Zoloft   Thank you for choosing [facility].   Please call [phone] with any questions about today's appointment.  Please be sure to schedule follow up at the front  desk before you leave today.   Dorris Singh, MD  Family Medicine

## 2021-04-30 NOTE — Assessment & Plan Note (Signed)
A1C in January. Continues to be diet controlled.

## 2021-04-30 NOTE — [place] Notes (Signed)
SUBJECTIVE:   CHIEF COMPLAINT: knee pain  HPI:   Lauren Craig is a 75 y.o. 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  She is joined by her daughter today.  She brings with her medications.  With her today and her medication box once again lisinopril and enalapril she is only taking enalapril.  She was restarted back on aspirin therapy by her cardiologist as they were uncertain why she was on DAPT to begin with.  We clarified this today.  Neurology has recommended she specifically be on clopidogrel alone after her stroke.  If  she would have a new indication such as stent placed or additional stroke we would reconsider this.  Patient reports ongoing left knee pain.  She fell several weeks ago and has persistent medial knee swelling.  This is a 59 with ecchymoses.  Pain is improved but the swelling is bothersome to her.  She tried topical therapies and Tylenol.  Patient reports intermittent tearful episodes.  She reports she thinks this is due to her Zoloft.  She denies low mood or thoughts of hurting her self or others.  She reports she generally looks at the happier side of things but finds sometimes she just cries for no reason.   PERTINENT  PMH / PSH/Family/Social History : Updated and reviewed as appropriate  OBJECTIVE:   BP (!) 145/60   Pulse 62   Wt 159 lb 3.2 oz (72.2 kg)   SpO2 97%   BMI 29.12 kg/m   Today's weight:  Last Weight  Most recent update: 04/30/2021  9:46 AM    Weight  72.2 kg (159 lb 3.2 oz)            Review of prior weights: Filed Weights   04/30/21 0945  Weight: 159 lb 3.2 oz (72.2 kg)   Pleasant appropriate woman in no distress.  She is speaking full sentences.  She is appropriate and able to keep up with most of her medications (names and doses).   There is some mild confusion around some discussions from prior visits although this is minimal and her memory appears to be improving. Cardiac: Regular rate and rhythm. Normal S1/S2. No murmurs, rubs, or gallops appreciated. Lungs: Clear bilaterally to ascultation.   Psych: Pleasant and appropriate  Bilateral knees examined.  She has some crepitus bilaterally but good range of motion.  No medial joint line tenderness bilaterally.  Knees are not symmetrical.  Along the left knee along the medial aspect of the patella there is a ecchymotic appearing soft hematoma vs bursa (although less very medially).  Mild ankle edema without calf edema No erythema No ecchymoses or injury of LE  Sensate feet exam, no ulcers   ASSESSMENT/PLAN:   Peripheral edema Patient continues to report this.  This is symmetric and I have low suspicion for venous thromboembolism.  We will check albumin and for anemia today.  Suspect this is related to her hypertension and venous insufficiency.  Most likely this is also in part due to her amlodipine at 10 mg.  Hyperlipidemia Not at goal--reports hard to take at night. Transition to AM. Repeat direct LDL today. If persistently elevated will refer to lipid clinic--question if needs PCKS9 inhibitor.   Controlled diabetes mellitus type 2 with complications ([place]) G8Z in January. Continues to be diet controlled.  Cerebrovascular disease Per  neurology for management of cerebrovascular disease she is to be on clopidogrel alone.  Given the patient's falls and risk of bleeding from dual antiplatelet therapy we will continue with clopidogrel alone.  At this time she has no additional indication for aspirin identified.  This was apparently restarted at her cardiology visit.  We will hold at this time. Needs follow up with [facility] at next visit.   I did discuss at length with her the indication for long-term event monitor with an implantable device today.  She is very hesitant to undergo  this procedure and would like to avoid seeing additional physicians at this time.  She is agreeable to see neurology again but will think about getting additional monitoring for potential atrial fibrillation.  We discussed the risk.  I did encourage her that if she is having additional stroke we should certainly get LINK placed.   HTN (hypertension) This is markedly improved today since her prior visit.  Her diastolic numbers for limited value in additional agent.  I do suspect her amlodipine is contributing to her lower extremity edema as described.  We will continue her current therapies for now.  Enalapril refill refilled.  BMP today. Goal for patient is less than 130/80 however her falls and diastolic pressures in the 60s limit this.  Esophagitis Recommended follow-up with gastroenterology.  She has yet to have her colonoscopy.  Provided number to call again.  No abdominal concerns at this time.  Continue current therapy of PPI.   Adjustment disorder I suspect her stroke actually contributing to her intermittent crying.  She does have a history of depressive episodes and will continue her SSRI therapy at this time.  Continue reducing in the summer.  Left knee pain suspect due to hematoma, referral to [facility] for ultrasound, this is actually improving at this time. Location makes me less concerned for ongoing bursitis.   HCM  Reports an RN came to home for DEXA? Will obtain records. UTD on vaccines including Shingles   At follow up- schedule with Neurology- [facility]     Dorris Singh, [place]

## 2021-04-30 NOTE — Assessment & Plan Note (Addendum)
Patient continues to report this.  This is symmetric and I have low suspicion for venous thromboembolism.  We will check albumin and for anemia today.  Suspect this is related to her hypertension and venous insufficiency.

## 2021-04-30 NOTE — Assessment & Plan Note (Signed)
Per neurology for management of cerebrovascular disease she is to be on clopidogrel alone.  Given the patient's falls and risk of bleeding from dual antiplatelet therapy we will continue with clopidogrel alone.  At this time she has no additional indication for aspirin identified.  This was apparently restarted at her cardiology visit.  We will hold at this time. Needs follow up with [facility] at next visit.   I did discuss at length with her the indication for long-term event monitor with an implantable device today.  She is very hesitant to undergo this procedure and would like to avoid seeing additional physicians at this time.  She is agreeable to see neurology again but will think about getting additional monitoring for potential atrial fibrillation.  We discussed the risk.  I did encourage her that if she is having additional stroke we should certainly get LINK placed.

## 2021-05-01 ENCOUNTER — Other Ambulatory Visit: Payer: Self-pay

## 2021-05-01 ENCOUNTER — Telehealth: Payer: Self-pay | Admitting: Family Medicine

## 2021-05-01 ENCOUNTER — Emergency Department (HOSPITAL_COMMUNITY)
Admission: EM | Admit: 2021-05-01 | Discharge: 2021-05-01 | Disposition: A | Discharge status: Home / Self Care | Payer: PPO | Attending: Emergency Medicine | Admitting: Emergency Medicine

## 2021-05-01 ENCOUNTER — Encounter (HOSPITAL_COMMUNITY): Payer: Self-pay | Admitting: Emergency Medicine

## 2021-05-01 DIAGNOSIS — E1136 Type 2 diabetes mellitus with diabetic cataract: Secondary | ICD-10-CM | POA: Diagnosis not present

## 2021-05-01 DIAGNOSIS — Z79899 Other long term (current) drug therapy: Secondary | ICD-10-CM | POA: Diagnosis not present

## 2021-05-01 DIAGNOSIS — I10 Essential (primary) hypertension: Secondary | ICD-10-CM | POA: Insufficient documentation

## 2021-05-01 DIAGNOSIS — Z7984 Long term (current) use of oral hypoglycemic drugs: Secondary | ICD-10-CM | POA: Insufficient documentation

## 2021-05-01 DIAGNOSIS — Z87891 Personal history of nicotine dependence: Secondary | ICD-10-CM | POA: Insufficient documentation

## 2021-05-01 DIAGNOSIS — R799 Abnormal finding of blood chemistry, unspecified: Secondary | ICD-10-CM | POA: Insufficient documentation

## 2021-05-01 DIAGNOSIS — R899 Unspecified abnormal finding in specimens from other organs, systems and tissues: Secondary | ICD-10-CM

## 2021-05-01 LAB — CBC
HCT: 35.7 % — ABNORMAL LOW (ref 36.0–46.0)
Hematocrit: 33.3 % — ABNORMAL LOW (ref 34.0–46.6)
Hemoglobin: 11 g/dL — ABNORMAL LOW (ref 11.1–15.9)
Hemoglobin: 11.1 g/dL — ABNORMAL LOW (ref 12.0–15.0)
MCH: 25.6 pg — ABNORMAL LOW (ref 26.6–33.0)
MCH: 25.3 pg — ABNORMAL LOW (ref 26.0–34.0)
MCHC: 33 g/dL (ref 31.5–35.7)
MCHC: 31.1 g/dL (ref 30.0–36.0)
MCV: 77 fL — ABNORMAL LOW (ref 79–97)
MCV: 81.5 fL (ref 80.0–100.0)
Platelets: 288 10*3/uL (ref 150–450)
Platelets: 274 10*3/uL (ref 150–400)
RBC: 4.3 x10E6/uL (ref 3.77–5.28)
RBC: 4.38 MIL/uL (ref 3.87–5.11)
RDW: 15.2 % (ref 11.7–15.4)
RDW: 15.4 % (ref 11.5–15.5)
WBC: 8.4 10*3/uL (ref 3.4–10.8)
WBC: 7.5 10*3/uL (ref 4.0–10.5)
nRBC: 0 % (ref 0.0–0.2)

## 2021-05-01 LAB — HEPATIC FUNCTION PANEL
ALT: 10 IU/L (ref 0–32)
AST: 13 IU/L (ref 0–40)
Albumin: 4.2 g/dL (ref 3.7–4.7)
Alkaline Phosphatase: 117 IU/L (ref 44–121)
Bilirubin Total: 0.3 mg/dL (ref 0.0–1.2)
Bilirubin, Direct: <0.1 mg/dL (ref 0.00–0.40)
Total Protein: 6.7 g/dL (ref 6.0–8.5)

## 2021-05-01 LAB — BASIC METABOLIC PANEL
Anion gap: 7 (ref 5–15)
BUN/Creatinine Ratio: 35 — ABNORMAL HIGH (ref 12–28)
BUN: 54 mg/dL — ABNORMAL HIGH (ref 8–27)
BUN: 50 mg/dL — ABNORMAL HIGH (ref 8–23)
CO2: 21 mmol/L (ref 20–29)
CO2: 23 mmol/L (ref 22–32)
Calcium: 9.7 mg/dL (ref 8.7–10.3)
Calcium: 9 mg/dL (ref 8.9–10.3)
Chloride: 106 mmol/L (ref 96–106)
Chloride: 108 mmol/L (ref 98–111)
Creatinine, Ser: 1.55 mg/dL — ABNORMAL HIGH (ref 0.57–1.00)
Creatinine, Ser: 1.56 mg/dL — ABNORMAL HIGH (ref 0.44–1.00)
GFR, Estimated: 34 mL/min — ABNORMAL LOW (ref >60)
Glucose, Bld: 146 mg/dL — ABNORMAL HIGH (ref 70–99)
Glucose: 143 mg/dL — ABNORMAL HIGH (ref 70–99)
Potassium: 7 mmol/L (ref 3.5–5.2)
Potassium: 4.9 mmol/L (ref 3.5–5.1)
Sodium: 141 mmol/L (ref 134–144)
Sodium: 138 mmol/L (ref 135–145)
eGFR: 35 mL/min/{1.73_m2} — ABNORMAL LOW (ref >59)

## 2021-05-01 LAB — LDL CHOLESTEROL, DIRECT: LDL Direct: 166 mg/dL — ABNORMAL HIGH (ref 0–99)

## 2021-05-01 NOTE — Discharge Instructions (Signed)
As discussed, your evaluation today has been largely reassuring.  But, it is important that you monitor your condition carefully, and do not hesitate to return to the ED if you develop new, or concerning changes in your condition.  Otherwise, please follow-up with your physician for appropriate ongoing care.  Please have repeat potassium testing performed in the next few days.

## 2021-05-01 NOTE — Telephone Encounter (Signed)
**   After-Hours/Emergency Line Call **  Received after hours page regarding critical lab value of potassium of 7.0  Called patient to inform her of critical results. Advised she be seen in the Emergency Room as soon as possible for repeat labs and additional management as necessary.  She verbalized understanding and will call her daughter for transportation.  I attempted to call her daughter [name] x2 with no answer.   Dr. Owens Shark, patient's PCP, also aware and will follow-up to ensure patient is seen today.   Alcus Dad, MD PGY-2 [place]

## 2021-05-01 NOTE — ED Provider Notes (Signed)
[facility] EMERGENCY DEPARTMENT Provider Note   CSN: [id number] Arrival date & time: 05/01/21  1123     History Chief Complaint  Patient presents with   abnormal labs    Lauren Craig is a 75 y.o. female.  HPI Patient presents with 2 family members.  All 3 of them note that the patient is in her usual state of health, has no complaints, has no pain, is acting in a normal fashion.  Patient denies pain, chest pain, dyspnea, nausea, focal changes.  Yesterday she had reportedly a good scheduled evaluation with her primary care physician.  However, today she was made aware of abnormal laboratory findings including hyperkalemia.  She is not on dialysis, has no recent medication changes. She presents for evaluation.  Here as above patient has essentially no complaints.    Past Medical History:  Diagnosis Date   Anxiety disorder    Arthritis    hands, hips   C. 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   Vitamin B12 deficiency     Patient Active Problem List   Diagnosis Date Noted   Swelling of joint of left knee 03/30/2021   Arthritis 03/15/2021   Esophagitis 03/15/2021   Renal cyst 12/17/2020   History of osteomyelitis 12/03/2020   Peripheral edema 11/19/2020   Visual field defect 11/08/2020   Cerebrovascular disease 11/08/2020   HTN (hypertension) 10/03/2017   Hyperlipidemia 10/03/2017   Controlled diabetes mellitus type 2 with complications ([place]) 26/94/8546   PVD (peripheral vascular disease) ([place]) 2010   Vitamin B12 deficiency 2002    Past Surgical History:  Procedure Laterality Date   ABDOMINAL AORTOGRAM W/LOWER EXTREMITY N/A 08/11/2017   Procedure: ABDOMINAL AORTOGRAM W/LOWER EXTREMITY;  Surgeon: Angelia Mould, MD;  Location: [facility];  Service: Cardiovascular;  Laterality: N/A;   ABDOMINAL HYSTERECTOMY     AMPUTATION Left 11/14/2019   Procedure: Left Fifth ray amputation;  Surgeon: Wylene Simmer, MD;  Location: [facility];  Service: Orthopedics;  Laterality: Left;   AMPUTATION TOE Left 05/17/2018   Procedure: 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;  Surgeon: Wylene Simmer, MD;  Location: [facility];  Service: Orthopedics;  Laterality: Left;   BIOPSY  10/29/2020   Procedure: BIOPSY;  Surgeon: Irene Shipper, MD;  Location[facility] ENDOSCOPY;  Service: Endoscopy;;   ESOPHAGOGASTRODUODENOSCOPY (EGD) WITH PROPOFOL N/A 10/29/2020   Procedure: ESOPHAGOGASTRODUODENOSCOPY (EGD) WITH PROPOFOL;  Surgeon: Irene Shipper, MD;  Location[facility]COPY;  Service: Endoscopy;  Laterality: N/A;   PARTIAL HYMENECTOMY  1968   TONSILLECTOMY AND ADENOIDECTOMY     VAGINAL HYSTERECTOMY  1987     OB History   No obstetric history on file.     Family History  Problem Relation Age of Onset   Diabetes Mellitus II Mother    Cerebrovascular Disease Mother    Hypertension Father    Heart Problems Brother        pacemaker   Diabetes Mellitus II Brother    Bipolar disorder Brother     Social History   Tobacco Use   Smoking status: Former    Packs/day: 1.00    Types: Cigarettes    Quit date: 2004  Years since quitting: 18.9   Smokeless tobacco: Never  Vaping Use   Vaping Use: Never used  Substance Use Topics   Alcohol use: Not Currently    Alcohol/week: 1.0 standard drink    Types: 1 Glasses of wine per week    Comment: wine.  occasional   Drug use: No    Home Medications Prior to Admission medications   Medication Sig Start Date End Date Taking? Authorizing Provider  acetaminophen (TYLENOL) 325 MG tablet Take 2 tablets (650 mg total) by mouth every 6 (six) hours as needed for headache. Patient not taking: Reported on 03/30/2021 11/01/20   Alcus Dad, MD  albuterol  (VENTOLIN HFA) 108 (90 Base) MCG/ACT inhaler Inhale 2 puffs into the lungs every 6 (six) hours as needed for wheezing or shortness of breath. 01/29/21   Martyn Malay, MD  amLODipine (NORVASC) 10 MG tablet Take 1 tablet (10 mg total) by mouth daily. 01/29/21   Martyn Malay, MD  atorvastatin (LIPITOR) 80 MG tablet Take 1 tablet (80 mg total) by mouth at bedtime. 03/19/21   Richardo Priest, MD  carvedilol (COREG) 6.25 MG tablet Take 6.25 mg by mouth daily.    [provider]  clopidogrel (PLAVIX) 75 MG tablet Take 75 mg by mouth daily.    [provider]  empagliflozin (JARDIANCE) 10 MG TABS tablet Take 1 tablet (10 mg total) by mouth daily. 03/31/21   Zenia Resides, MD  enalapril (VASOTEC) 20 MG tablet Take 1 tablet (20 mg total) by mouth daily. 04/30/21   Martyn Malay, MD  ezetimibe (ZETIA) 10 MG tablet Take 1 tablet (10 mg total) by mouth daily. 03/19/21   Richardo Priest, MD  famotidine (PEPCID) 10 MG tablet Take 1 tablet (10 mg total) by mouth daily. Patient not taking: No sig reported 01/29/21   Martyn Malay, MD  fluticasone [facility]) 50 MCG/ACT nasal spray Place 2 sprays into both nostrils daily.    [provider]  indapamide (LOZOL) 2.5 MG tablet Take 1 tablet (2.5 mg total) by mouth daily. 03/03/21   Martyn Malay, MD  nystatin cream (MYCOSTATIN) Apply 1 application topically 2 (two) times daily. 03/01/21   Martyn Malay, MD  OVER THE COUNTER MEDICATION Take 1 tablet by mouth daily. Prevagen - once daily    [provider]  pantoprazole (PROTONIX) 40 MG tablet Take 40 mg by mouth daily.    [provider]  sertraline (ZOLOFT) 25 MG tablet Take 1 tablet (25 mg total) by mouth daily. 03/03/21   Martyn Malay, MD  sucralfate (CARAFATE) 1 g tablet Take 1 tablet (1 g total) by mouth daily before supper. 01/29/21   Martyn Malay, MD  triamcinolone ointment (KENALOG) 0.1 % Apply 1 application topically 2 (two) times daily. 03/01/21   Martyn Malay, MD    Allergies    Penicillins, Insulin lispro prot & lispro, Semaglutide, and Metformin  Review of Systems   Review of Systems  Constitutional:        Per HPI, otherwise negative  HENT:         Per HPI, otherwise negative  Respiratory:         Per HPI, otherwise negative  Cardiovascular:        Per HPI, otherwise negative  Gastrointestinal:  Negative for vomiting.  Endocrine:       Negative aside from HPI  Genitourinary:        Neg aside from HPI  Musculoskeletal:        Per HPI, otherwise negative  Skin: Negative.   Neurological:  Negative for syncope.   Physical Exam Updated Vital Signs BP (!) 199/73 (BP Location: Left Arm)   Pulse (!) 58   Temp 98 F (36.7 C) (Oral)   Resp 17   SpO2 99%   Physical Exam Vitals and nursing note reviewed.  Constitutional:      General: She is not in acute distress.    Appearance: She is well-developed.  HENT:     Head: Normocephalic and atraumatic.  Eyes:     Conjunctiva/sclera: Conjunctivae normal.  Cardiovascular:     Rate and Rhythm: Normal rate and regular rhythm.  Pulmonary:     Effort: Pulmonary effort is normal. No respiratory distress.     Breath sounds: Normal breath sounds. No stridor.  Abdominal:     General: There is no distension.  Skin:    General: Skin is warm and dry.  Neurological:     Mental Status: She is alert and oriented to person, place, and time.     Cranial Nerves: No cranial nerve deficit.    ED Results / Procedures / Treatments   Labs (all labs ordered are listed, but only abnormal results are displayed) Labs Reviewed  BASIC METABOLIC PANEL - Abnormal; Notable for the following components:      Result Value   Glucose, Bld 146 (*)    BUN 50 (*)    Creatinine, Ser 1.56 (*)    GFR, Estimated 34 (*)    All other components within normal limits  CBC - Abnormal; Notable for the following components:   Hemoglobin 11.1 (*)    HCT 35.7 (*)    MCH 25.3 (*)    All other components  within normal limits    EKG EKG Interpretation  Date/Time:  Saturday May 01 2021 11:29:15 EST Ventricular Rate:  59 PR Interval:  190 QRS Duration: 84 QT Interval:  436 QTC Calculation: 431 R Axis:   -15 Text Interpretation: Sinus bradycardia T wave abnormality Abnormal ECG Confirmed by Carmin Muskrat [phone]) on 05/01/2021 12:56:51 PM  Radiology No results found.  Procedures Procedures   Medications Ordered in ED Medications - No data to display  ED Course  I have reviewed the triage vital signs and the nursing notes.  Pertinent labs & imaging results that were available during my care of the patient were reviewed by me and considered in my medical decision making (see chart for details). Seen evaluate here within normal limits.  EKG with some nonspecific T wave changes, not in all leads.  Patient has no complaints, evidence for no distress, has no hemodynamic instability.  Given reassuring lab value here, patient is appropriate for close outpatient follow-up with your physician in the coming days will have 1 more repeat evaluation to ensure appropriate electrolyte levels, some suspicion for lab error contributing to today's findings.  Final Clinical Impression(s) / ED Diagnoses Final diagnoses:  Abnormal laboratory test    Rx / DC Orders ED Discharge Orders     None        Carmin Muskrat, MD 05/01/21 1319

## 2021-05-01 NOTE — ED Provider Notes (Signed)
Emergency Medicine Provider Triage Evaluation Note  Lauren Craig , a 75 y.o. female  was evaluated in triage.  Pt complains of high potassium lab value. Pt was informed by her primary care provider to come into the ED due to high potassium. Denies chest pain, shortness of breath, abdominal pain, nausea, vomiting, fever, chills. Denies taking potassium supplements. Took her blood pressure medications this morning.    Review of Systems  Positive:  Negative: Chest pain, shortness of breath,   Physical Exam  BP (!) 199/73 (BP Location: Left Arm)   Pulse (!) 58   Temp 98 F (36.7 C) (Oral)   Resp 17   SpO2 99%  Gen:   Awake, no distress   Resp:  Normal effort  MSK:   Moves extremities without difficulty    Medical Decision Making  Medically screening exam initiated at 11:36 AM.  Appropriate orders placed.  Lauren Craig was informed that the remainder of the evaluation will be completed by another provider, this initial triage assessment does not replace that evaluation, and the importance of remaining in the ED until their evaluation is complete.    Lauren Craig A, PA-C 05/01/21 1142    Lauren Muskrat, MD 05/01/21 [phone]

## 2021-05-01 NOTE — ED Triage Notes (Signed)
Pt had routine labs at PCP office yesterday and received a call to come to ED this morning due to Potassium >7.  Pt states she feels fine.  Denies any complaints other than knee pain that she is supposed to see orthopedics for.

## 2021-05-01 NOTE — Telephone Encounter (Signed)
Called patient with potassium--patient on way to ED. Reviewed rest of labs, has a hard time remembering to take statin. Will discuss going to lipid clinic (suspect adherence would be better with PCKS9).   Dorris Singh, MD  Family Medicine Teaching Service

## 2021-05-05 ENCOUNTER — Encounter: Payer: Self-pay | Admitting: Family Medicine

## 2021-05-05 ENCOUNTER — Telehealth: Payer: Self-pay | Admitting: Family Medicine

## 2021-05-05 NOTE — Telephone Encounter (Signed)
Called patient. Discussed labs. LDL still very high--only taking lipitor 1-2 times per week. Now that she is taking in AM missed no doses.  Sent MyChart about GI appointment  All questions answered.Repeat LDL at follow up  Dorris Singh, MD  [facility] Medicine Teaching Service

## 2021-05-11 ENCOUNTER — Ambulatory Visit: Payer: PPO | Admitting: Sports Medicine

## 2021-05-14 ENCOUNTER — Encounter: Payer: PPO | Admitting: Physical Medicine & Rehabilitation

## 2021-05-17 ENCOUNTER — Encounter: Payer: Self-pay | Admitting: Cardiology

## 2021-05-19 ENCOUNTER — Ambulatory Visit (INDEPENDENT_AMBULATORY_CARE_PROVIDER_SITE_OTHER): Payer: PPO | Admitting: Psychologist

## 2021-05-19 ENCOUNTER — Ambulatory Visit: Payer: PPO

## 2021-05-19 DIAGNOSIS — F411 Generalized anxiety disorder: Secondary | ICD-10-CM

## 2021-05-19 DIAGNOSIS — F320 Major depressive disorder, single episode, mild: Secondary | ICD-10-CM | POA: Diagnosis not present

## 2021-05-19 NOTE — Progress Notes (Signed)
[facility] Counselor/Therapist Progress Note  Patient ID: Lauren Craig, MRN: [id number],    Date: 05/19/2021  Time Spent: 9:01 am to 9:40 am; Total Time: 39 minutes   This session was held via phone teletherapy due to the coronavirus risk at this time. The patient consented to phone teletherapy and was located at her home during this session. She is aware it is the responsibility of the patient to secure confidentiality on her end of the session. The provider was in a private home office for the duration of this session. Limits of confidentiality were discussed with the patient.   Treatment Type: Individual Therapy  Reported Symptoms: Decrease in anxiety symptoms.   Mental Status Exam: Appearance:  NA     Behavior: Appropriate  Motor: NA  Speech/Language:  Normal Rate  Affect: Appropriate  Mood: normal  Thought process: normal  Thought content:   WNL  Sensory/Perceptual disturbances:   WNL  Orientation: oriented to person, place, and time/date  Attention: Good  Concentration: Good  Memory: WNL  Fund of knowledge:  Good  Insight:   Fair  Judgment:  Fair  Impulse Control: Good   Risk Assessment: Danger to Self:  No Self-injurious Behavior: No Danger to Others: No Duty to Warn:no Physical Aggression / Violence:No  Access to Firearms a concern: No  Gang Involvement:No   Subjective: Beginning the session, she described herself as doing well while reflecting on events since the intake. After reviewing events since the intake, she stated she wanted coping skills. After discussing, processing, and practicing coping skills, she described herself as better. She was agreeable to homework and following up. She denied suicidal and homicidal ideation   Interventions:  Worked on developing a therapeutic relationship with the patient using active listening and reflective statements. Provided emotional support using empathy and validation. Reviewed the treatment plan with  the patient. Reviewed events since the intake. Provided psychoeducation about mindfulness, mindful moments, guided imagery, defusion, and practicing when not feeling distressed. Practiced and processed mindfulness, guided imagery, and defusion. Praised patient for experiencing less distress. Assisted in problem solving. Encouraged patient to practice two to three times daily for two to three minutes. Assigned homework. Assessed for suicidal and homicidal ideation.   Homework: Implement coping strategies  Next Session: Review homework, PMR, worry box, and emotional support  Diagnosis: F32.0 major depressive affective disorder, single episode, mild and F41.1 generalized anxiety disorder  Plan:   Client Abilities: Friendly and easy to develop rapport  Client Preferences: ACT and CBT  Client statement of Needs: Coping skills and process emotions  Treatment Level: Outpatient   Goals Alleviate depressive symptoms Recognize, accept, and cope with depressive feelings Develop healthy thinking patterns Develop healthy interpersonal relationships Reduce overall frequency, intensity, and duration of anxiety Stabilize anxiety level wile increasing ability to function Enhance ability to effectively cope with full variety of stressors Learn and implement coping skills that result in a reduction of anxiety   Objectives: Target Date for all objectives is 11.28.2023 Verbalize an understanding of the cognitive, physiological, and behavioral components of anxiety Learning and implement calming skills to reduce overall anxiety Verbalize an understanding of the role that cognitive biases play in excessive irrational worry and persistent anxiety symptoms Identify, challenge, and replace based fearful talk Learn and implement problem solving strategies Identify and engage in pleasant activities Learning and implement personal and interpersonal skills to reduce anxiety and improve interpersonal  relationships Learn to accept limitations in life and commit to tolerating, rather than avoiding, unpleasant  emotions while accomplishing meaningful goals Identify major life conflicts from the past and present that form the basis for present anxiety Maintain involvement in work, family, and social activities Reestablish a consistent sleep-wake cycle Cooperate with a medical evaluation  Cooperate with a medication evaluation by a physician Verbalize an accurate understanding of depression Verbalize an understanding of the treatment Identify and replace thoughts that support depression Learn and implement behavioral strategies Verbalize an understanding and resolution of current interpersonal problems Learn and implement problem solving and decision making skills Learn and implement conflict resolution skills to resolve interpersonal problems Verbalize an understanding of healthy and unhealthy emotions verbalize insight into how past relationships may be influence current experiences with depression Use mindfulness and acceptance strategies and increase value based behavior  Increase hopeful statements about the future.  Interventions Engage the patient in behavioral activation Use instruction, modeling, and role-playing to build the client's general social, communication, and/or conflict resolution skills Use Acceptance and Commitment Therapy to help client accept uncomfortable realities in order to accomplish value-consistent goals Reinforce the client's insight into the role of his/her past emotional pain and present anxiety  Support the client in following through with work, family, and social activities Teach and implement sleep hygiene practices  Refer the patient to a physician for a psychotropic medication consultation Monito the clint's psychotropic medication compliance Discuss how anxiety typically involves excessive worry, various bodily expressions of tension, and avoidance of  what is threatening that interact to maintain the problem  Teach the patient relaxation skills Assign the patient homework Discuss examples demonstrating that unrealistic worry overestimates the probability of threats and underestimates patient's ability  Assist the patient in analyzing his or her worries Help patient understand that avoidance is reinforcing  Consistent with treatment model, discuss how change in cognitive, behavioral, and interpersonal can help client alleviate depression CBT Behavioral activation help the client explore the relationship, nature of the dispute,  Help the client develop new interpersonal skills and relationships Conduct Problem so living therapy Teach conflict resolution skills Use a process-experiential approach Conduct TLDP Conduct ACT Evaluate need for psychotropic medication Monitor adherence to medication    Conception Chancy, PsyD

## 2021-05-19 NOTE — Patient Instructions (Signed)
Visit Information  Lauren Craig  it was nice speaking with you. Please call me directly [phone] if you have questions about the goals we discussed.   Patient Goals/Self Care Activities: -Patient/Caregiver will self-administer medications as prescribed as evidenced by self-report/primary caregiver report ,  -Patient/Caregiver will attend all scheduled provider appointments as evidenced by clinician review of documented attendance to scheduled appointments and patient/caregiver report,  -Patient/Caregiver will call pharmacy for medication refills as evidenced by patient report and review of pharmacy fill history as appropriate,  -Patient/Caregiver will call provider office for new concerns or questions as evidenced by review of documented incoming telephone call notes and patient report, Patient/Caregiver verbalizes understanding of plan, -Patient/Caregiver will focus on medication adherence by taking all medications as prescribed -Calls provider office for new concerns, questions, or BP outside discussed parameters -Checks BP and records as discussed -Follows a low sodium diet/DASH diet    The patient verbalized understanding of instructions, educational materials, and care plan provided today and declined offer to receive copy of patient instructions, educational materials, and care plan.   Follow up Plan: Patient would like continued follow-up.  CCM RNCM will outreach the patient within the next 4 weeks.  Patient will call office if needed prior to next encounter  Lazaro Arms, RN  [phone]

## 2021-05-19 NOTE — Chronic Care Management (AMB) (Signed)
Chronic Care Management   CCM RN Visit Note  05/19/2021 Name: Lauren Craig MRN: [id number] DOB: 1946/03/17  Subjective: Lauren Craig is a 75 y.o. year old female who is a primary care patient of Martyn Malay, MD. The care management team was consulted for assistance with disease management and care coordination needs.    Engaged with patient by telephone for follow up visit in response to provider referral for case management and/or care coordination services.   Consent to Services:  The patient was given information about Chronic Care Management services, agreed to services, and gave verbal consent prior to initiation of services.  Please see initial visit note for detailed documentation.   Patient agreed to services and verbal consent obtained.    Assessment:  The patient has not been checking her blood pressures . See Care Plan below for interventions and patient self-care actives. Follow up Plan: Patient would like continued follow-up.  CCM RNCM will outreach the patient within the next 4 weeks.  Patient will call office if needed prior to next encounter  Review of patient past medical history, allergies, medications, health status, including review of consultants reports, laboratory and other test data, was performed as part of comprehensive evaluation and provision of chronic care management services.   SDOH (Social Determinants of Health) assessments and interventions performed:    CCM Care Plan  Allergies  Allergen Reactions   Penicillins Hives, Rash and Other (See Comments)    Did it involve swelling of the face/tongue/throat, SOB, or low BP? Yes Did it involve sudden or severe rash/hives, skin peeling, or any reaction on the inside of your mouth or nose? No Did you need to seek medical attention at a hospital or doctor's office? Yes When did it last happen? ? If all above answers are NO, may proceed with cephalosporin use.    Insulin Lispro Prot & Lispro Other  (See Comments)    Site of injection was inflamed, red, painful episode   Semaglutide Nausea And Vomiting   Metformin Diarrhea    Outpatient Encounter Medications as of 05/19/2021  Medication Sig Note   acetaminophen (TYLENOL) 325 MG tablet Take 2 tablets (650 mg total) by mouth every 6 (six) hours as needed for headache. (Patient not taking: Reported on 03/30/2021) 03/30/2021: Uses for pain PRN - takes two at a time.    albuterol (VENTOLIN HFA) 108 (90 Base) MCG/ACT inhaler Inhale 2 puffs into the lungs every 6 (six) hours as needed for wheezing or shortness of breath. 03/30/2021: 2 puffs each morning and PRN in the evening.    amLODipine (NORVASC) 10 MG tablet Take 1 tablet (10 mg total) by mouth daily.    atorvastatin (LIPITOR) 80 MG tablet Take 1 tablet (80 mg total) by mouth at bedtime.    carvedilol (COREG) 6.25 MG tablet Take 6.25 mg by mouth daily.    clopidogrel (PLAVIX) 75 MG tablet Take 75 mg by mouth daily.    empagliflozin (JARDIANCE) 10 MG TABS tablet Take 1 tablet (10 mg total) by mouth daily.    enalapril (VASOTEC) 20 MG tablet Take 1 tablet (20 mg total) by mouth daily.    ezetimibe (ZETIA) 10 MG tablet Take 1 tablet (10 mg total) by mouth daily.    famotidine (PEPCID) 10 MG tablet Take 1 tablet (10 mg total) by mouth daily. (Patient not taking: No sig reported)    fluticasone (FLONASE) 50 MCG/ACT nasal spray Place 2 sprays into both nostrils daily.    indapamide (LOZOL)  2.5 MG tablet Take 1 tablet (2.5 mg total) by mouth daily.    nystatin cream (MYCOSTATIN) Apply 1 application topically 2 (two) times daily.    OVER THE COUNTER MEDICATION Take 1 tablet by mouth daily. Prevagen - once daily    pantoprazole (PROTONIX) 40 MG tablet Take 40 mg by mouth daily.    sertraline (ZOLOFT) 25 MG tablet Take 1 tablet (25 mg total) by mouth daily.    sucralfate (CARAFATE) 1 g tablet Take 1 tablet (1 g total) by mouth daily before supper.    triamcinolone ointment (KENALOG) 0.1 % Apply 1  application topically 2 (two) times daily.    No facility-administered encounter medications on file as of 05/19/2021.    Patient Active Problem List   Diagnosis Date Noted   Swelling of joint of left knee 03/30/2021   Arthritis 03/15/2021   Esophagitis 03/15/2021   Renal cyst 12/17/2020   History of osteomyelitis 12/03/2020   Peripheral edema 11/19/2020   Visual field defect 11/08/2020   Cerebrovascular disease 11/08/2020   HTN (hypertension) 10/03/2017   Hyperlipidemia 10/03/2017   Controlled diabetes mellitus type 2 with complications ([place]) 38/02/1750   PVD (peripheral vascular disease) ([place]) 2010   Vitamin B12 deficiency 2002    Conditions to be addressed/monitored:HTN  Care Plan : RN Care Manager  Updates made by Lazaro Arms, RN since 05/19/2021 12:00 AM     Problem: Hypertension      Goal: The patient wil monitor and maintain her BP within given range   Start Date: 02/10/2021  Expected End Date: 06/29/2021  Priority: High  Note:   Current Barriers:  Knowledge Deficits related to plan of care for management of HTN  Chronic Disease Management support and education needs related to HTN  RNCM Clinical Goal(s):  Patient will verbalize understanding of plan for management of HTN through collaboration with RN Care manager, provider, and care team.   Interventions: 1:1 collaboration with primary care provider regarding development and update of comprehensive plan of care as evidenced by provider attestation and co-signature Inter-disciplinary care team collaboration (see longitudinal plan of care) Evaluation of current treatment plan related to  self management and patient's adherence to plan as established by provider   Hypertension: (Status: New goal. Goal on track: NO.) Long term Last practice recorded BP readings:  BP Readings from Last 3 Encounters:  05/01/21 (!) 199/73  04/30/21 (!) 145/60  03/30/21 (!) 168/68  Most recent eGFR/CrCl:  Lab Results  Component  Value Date   EGFR 35 (L) 04/30/2021    No components found for: CRCL  Evaluation of current treatment plan related to hypertension self management and patient's adherence to plan as established by provider;   Reviewed prescribed diet low sodium Reviewed medications with patient and discussed importance of compliance;  Patient denies any chest pain shortness of breath Discussed plans with patient for ongoing care management follow up and provided patient with direct contact information for care management team; Advised patient, providing education and rationale, to monitor blood pressure daily and record, calling PCP for findings outside established parameters;  12/221/22: Mrs. Saggese states that she has not checked her blood pressure recently because she has been swamped.  She denies any symptoms, such as headache, chest pain, or flushing.  She said that she has been feeling good. We discussed checking her blood pressure and recording her values, monitoring her diet during the holidays, and taking her medications as prescribed.   Patient Goals/Self Care Activities: -Patient/Caregiver will self-administer medications  as prescribed as evidenced by self-report/primary caregiver report ,  -Patient/Caregiver will attend all scheduled provider appointments as evidenced by clinician review of documented attendance to scheduled appointments and patient/caregiver report,  -Patient/Caregiver will call pharmacy for medication refills as evidenced by patient report and review of pharmacy fill history as appropriate,  -Patient/Caregiver will call provider office for new concerns or questions as evidenced by review of documented incoming telephone call notes and patient report, Patient/Caregiver verbalizes understanding of plan, -Patient/Caregiver will focus on medication adherence by taking all medications as prescribed -Calls provider office for new concerns, questions, or BP outside discussed  parameters -Checks BP and records as discussed -Follows a low sodium diet/DASH diet      Lazaro Arms RN, BSN, [facility] Management Coordinator [place] Phone: [phone] I Fax: [phone]

## 2021-06-01 ENCOUNTER — Encounter: Payer: Self-pay | Admitting: Nurse Practitioner

## 2021-06-01 ENCOUNTER — Ambulatory Visit (INDEPENDENT_AMBULATORY_CARE_PROVIDER_SITE_OTHER): Payer: PPO | Admitting: Nurse Practitioner

## 2021-06-01 ENCOUNTER — Other Ambulatory Visit: Payer: PPO

## 2021-06-01 VITALS — BP 148/66 | HR 78 | Ht 62.0 in | Wt 160.8 lb

## 2021-06-01 DIAGNOSIS — R112 Nausea with vomiting, unspecified: Secondary | ICD-10-CM | POA: Diagnosis not present

## 2021-06-01 DIAGNOSIS — R197 Diarrhea, unspecified: Secondary | ICD-10-CM | POA: Diagnosis not present

## 2021-06-01 DIAGNOSIS — D649 Anemia, unspecified: Secondary | ICD-10-CM | POA: Diagnosis not present

## 2021-06-01 NOTE — Progress Notes (Signed)
ASSESSMENT AND PLAN     # 76 yo female with recurrent intermittent nausea, vomiting, diarrhea. When we evaluated her in hospital for these symptoms in June 2022  she was found to be indeterminate for c-diff infection and diarrhea resolved with course of Vancomycin. EGD remarkable for severe esophagitis ( probably related to vomiting) and severe duodenitis. Symptoms resolved with better glucose control, PPI and carafate. Etiology of recurrent symptoms unclear. Rule out C-diff infection.  --Obtain C-diff PCR since she was deemed "indeterminate" last time.  --If C-diff negative then we recommend diagnostic colonoscopy off Plavix. However she wants to think about this.  She doesn't have concerns about the procedure but rather its necessity which we discussed ( never had colon cancer screening, diarrhea evaluation and also anemia) --May need follow EGD due to recurrent nausea / persistent anemia  / severe esophagitis and duodenitis.    --Continue to keep blood glucose levels under control  # [place] anemia on plavix. No overt GI bleeding.  First noted to be anemic in early June when hgb declined from 14.3 to 11.5 in setting of severe esophagitis and duodenitis. Hgb has remained low but stable around 11 since then  Previous iron studies do not suggest IDA. Unclear why hgb has not recovered.  --As mentioned above, she will think about the colonoscopy and let us know   --Will see if Dr. Henrene Pastor feels follow up EGD is needed as discussed above.   HISTORY OF PRESENT ILLNESS    Chief Complaint : recurrent nausea, vomiting and diarrhea.   Genesis Novosad is a 76 y.o. female known to Dr. Henrene Pastor with a past medical history of diabetes, hypertension, cholelithiasis status post cholelithiasis.  Additional medical history as listed in [place] .   Patient was seen by Dr. Henrene Pastor in the hospital June 2022 for chronic nausea, vomiting, diarrhea and abdominal pain.  Please see that hospital consult note from 10/29/20.  CTAP  suggested tip appendicitis, evaluated by General Surgery who didn't feel clinical pictures c/w appendicitis. CTAP also suggested esophageal wall thickening. She underwent an inpatient EGD showing severe esophagitis and duodenitis.  Stool studies were indeterminate for C. difficile , she was treated with vancomycin. There was concern that uncontrolled diabetes could be contributing to  her GI symptoms.  She came in for an office follow-up July 2022, her nausea and vomiting had resolved with twice daily PPI, low-dose Pepcid at bedtime and Carafate.  Diarrhea had resolved on with Vancomycin .  Patient had not had a screening colonoscopy but at the time of her follow-up visit 11/27/2020 we decided to hold off given that she was still recovering from extended illness.  Furthermore her hypertension was not well controlled and she was undergoing Neurology evaluation for a prior recent stroke   Interval history :  Patient  is here with recurrent , intermittent nausea, vomiting and diarrhea since October. Symptoms occur about 3 days out of the week.  On days she has diarrhea she has multiple episodes a day. On the other days her BMs are solid. She has hasn't had any antibiotics in the last several months. No medication or dietary changes to correlate with symptoms. She takes Phenergan as needed which helps. Nausea.  She is taking protonix 40 mg daily before breakfast. She takes Pepcid in am, she wouldn't be able to remember to take it at bedtime.   Weight is stable.  Patient tells me her blood sugars do not exceed 145.   PREVIOUS ENDOSCOPIC EVALUATIONS /  PERTINENT STUDIES:   10/29/20 EGD 1, Severe reflux esophagitis 2. Severe duodenitis. Status post biopsies 3. Otherwise unremarkable EGD  FINAL MICROSCOPIC DIAGNOSIS:   A. STOMACH, BIOPSY:  - Chronic gastritis.  - [place]-Starry is negative for Helicobacter pylori.  - No intestinal metaplasia, dysplasia, or malignancy.    10/28/20 CTAP wo  contrast IMPRESSION: 1. The tip of the appendix is mildly dilated with subtle periappendiceal fat stranding. Findings are concerning for earlyacute uncomplicated tip appendicitis. 2. Subtle fat stranding in the region of the duodenal bulb, which may represent duodenitis or peptic ulcer disease. 3. Circumferential thickening of the distal esophagus, suggesting esophagitis. Correlation with nonemergent endoscopy is recommended. 4. Small sliding type hiatal hernia. 5. Extensive colonic diverticulosis without evidence of acute diverticulitis. 6. Aortic atherosclerosis (ICD10-I70.0).    Current Medications, Allergies, Past Medical History, Past Surgical History, Family History and Social History were reviewed in [employer] [employer] record.     Current Outpatient Medications  Medication Sig Dispense Refill   acetaminophen (TYLENOL) 325 MG tablet Take 2 tablets (650 mg total) by mouth every 6 (six) hours as needed for headache.     albuterol (VENTOLIN HFA) 108 (90 Base) MCG/ACT inhaler Inhale 2 puffs into the lungs every 6 (six) hours as needed for wheezing or shortness of breath. 18 g 3   amLODipine (NORVASC) 10 MG tablet Take 1 tablet (10 mg total) by mouth daily. 90 tablet 3   atorvastatin (LIPITOR) 80 MG tablet Take 1 tablet (80 mg total) by mouth at bedtime. 90 tablet 3   carvedilol (COREG) 6.25 MG tablet Take 6.25 mg by mouth daily.     clopidogrel (PLAVIX) 75 MG tablet Take 75 mg by mouth daily.     empagliflozin (JARDIANCE) 10 MG TABS tablet Take 1 tablet (10 mg total) by mouth daily. 90 tablet 3   enalapril (VASOTEC) 20 MG tablet Take 1 tablet (20 mg total) by mouth daily. 90 tablet 3   ezetimibe (ZETIA) 10 MG tablet Take 1 tablet (10 mg total) by mouth daily. 90 tablet 3   famotidine (PEPCID) 10 MG tablet Take 1 tablet (10 mg total) by mouth daily. 90 tablet 3   fluticasone (FLONASE) 50 MCG/ACT nasal spray Place 2 sprays into both nostrils daily.     indapamide (LOZOL)  2.5 MG tablet Take 1 tablet (2.5 mg total) by mouth daily. 90 tablet 3   nystatin cream (MYCOSTATIN) Apply 1 application topically 2 (two) times daily. 90 g 1   OVER THE COUNTER MEDICATION Take 1 tablet by mouth daily. Prevagen - once daily     pantoprazole (PROTONIX) 40 MG tablet Take 40 mg by mouth daily.     promethazine (PHENERGAN) 25 MG tablet Take 25 mg by mouth every 6 (six) hours as needed for nausea or vomiting. As needed     sertraline (ZOLOFT) 25 MG tablet Take 1 tablet (25 mg total) by mouth daily. 90 tablet 3   sucralfate (CARAFATE) 1 g tablet Take 1 tablet (1 g total) by mouth daily before supper. 90 tablet 3   triamcinolone ointment (KENALOG) 0.1 % Apply 1 application topically 2 (two) times daily. 80 g 1   No current facility-administered medications for this visit.    Review of Systems: No chest pain. No shortness of breath. No urinary complaints.   PHYSICAL EXAM :    Wt Readings from Last 3 Encounters:  06/01/21 160 lb 12.8 oz (72.9 kg)  04/30/21 159 lb 3.2 oz (72.2 kg)  03/30/21 162  lb (73.5 kg)    BP (!) 148/66    Pulse 78    Ht 5\' 2"  (1.575 m)    Wt 160 lb 12.8 oz (72.9 kg)    BMI 29.41 kg/m  Constitutional:  Generally well appearing female in no acute distress. Psychiatric: Pleasant. Normal mood and affect. Behavior is normal. EENT: Pupils normal.  Conjunctivae are normal. No scleral icterus. Neck supple.  Cardiovascular: Normal rate, regular rhythm. No edema Pulmonary/chest: Effort normal and breath sounds normal. No wheezing, rales or rhonchi. Abdominal: Soft, nondistended, nontender. Bowel sounds active throughout. There are no masses palpable. No hepatomegaly. Neurological: Alert and oriented to person place and time. Skin: Skin is warm and dry. No rashes noted.  Tye Savoy, NP  06/01/2021, 10:42 AM  Cc:  Martyn Malay, MD

## 2021-06-01 NOTE — Patient Instructions (Signed)
It has been recommended to you by your physician that you have a(n) colonoscopy completed. Per your request, we did not schedule the procedure(s) today. Please contact our office at [phone] should you decide to have the procedure completed. You will be scheduled for a pre-visit and procedure at that time.   Your provider has requested that you go to the basement level for lab work before leaving today. Press "B" on the elevator. The lab is located at the first door on the left as you exit the elevator.   Due to recent changes in healthcare laws, you may see the results of your imaging and laboratory studies on MyChart before your provider has had a chance to review them.  We understand that in some cases there may be results that are confusing or concerning to you. Not all laboratory results come back in the same time frame and the provider may be waiting for multiple results in order to interpret others.  Please give [place] 48 hours in order for your provider to thoroughly review all the results before contacting the office for clarification of your results.    If you are age 62 or older, your body mass index should be between 23-30. Your Body mass index is 29.41 kg/m. If this is out of the aforementioned range listed, please consider follow up with your Primary Care Provider.  If you are age 8 or younger, your body mass index should be between 19-25. Your Body mass index is 29.41 kg/m. If this is out of the aformentioned range listed, please consider follow up with your Primary Care Provider.   ________________________________________________________  The [facility] providers would like to encourage you to use [facility] to communicate with providers for non-urgent requests or questions.  Due to long hold times on the telephone, sending your provider a message by [facility] may be a faster and more efficient way to get a response.  Please allow 48 business hours for a response.  Please remember that this  is for non-urgent requests.  _______________________________________________________   Thank you for choosing [facility]  [place]

## 2021-06-02 NOTE — Progress Notes (Signed)
Noted. I do not see a need for EGD as long as she is on PPI.  Thanks

## 2021-06-06 NOTE — Progress Notes (Signed)
SUBJECTIVE:   CHIEF COMPLAINT: follow up, medication and BP review HPI:   Lauren Craig is a 76 y.o.  with history notable for hypertension, cerebrovascular disease, type 2 diabetes (well controlled) presenting for follow up with her daughter.  Her primary concern today is ongoing nausea and vomiting.  Three Days of Nausea and Vomiting  The patient has a history of recurrent episodes of nausea, vomiting, diarrhea and weight loss.  She was admitted to the hospital for this and found to have severe duodenitis and esophagitis on endoscopy.  She was also diagnosed and treated for C. difficile infection with fidaxomicin.  She was recently seen by gastroenterology who recommended a colonoscopy and EGD.  She reports her symptoms of actually been doing okay until this past Friday.  She is not sure of the trigger.  She spent most of the day Saturday vomiting.  She denies headache, chest pains, shortness of breath, ongoing diarrhea, nausea today or ongoing vomiting.  She was able to eat yesterday and drink water today.  She has not had any episodes of emesis today.  She has not had diarrhea since Saturday.  She does not think this is related to food.  She does report abdominal pain just before she goes to the bathroom.  She denies melena or hematochezia.  She denies dizziness upon standing.  She has had no falls.  Hypertension   The patient reports compliance with her medications.  She is out of her amlodipine medication.  She denies chest pain, headaches, dizziness or falls.  Type 2 diabetes The patient reports she needs a refill on her Jardiance.  She has not been taking this.  She denies any side effects.  She does need a refill.  She denies polyuria or polydipsia.  She is pleased with her A1c today. Social stressors The patient recently had a family member die.  She did have a good Christmas.  She is very interested in having a care assistant through the [facility]. Form given to provider today.    PERTINENT  PMH / PSH/Family/Social History : updated and reviewed   OBJECTIVE:   BP (!) 155/61    Pulse (!) 54    Ht 5\' 2"  (1.575 m)    Wt 154 lb 9.6 oz (70.1 kg)    SpO2 100%    BMI 28.28 kg/m   Today's weight:  Last Weight  Most recent update: 06/07/2021 10:40 AM    Weight  70.1 kg (154 lb 9.6 oz)            Review of prior weights: Filed Weights   06/07/21 1040  Weight: 154 lb 9.6 oz (70.1 kg)   Well-appearing woman in no distress.  She is alert and oriented and appropriate.  Cardiac exam regular rate and rhythm no murmurs rubs or gallops.  Lungs clear bilaterally to auscultation.  Abdomen has normoactive bowel sounds she has no abdominal tenderness there is no rebound or guarding.  Lower extremities are examined the ecchymoses over her left knee is improving although there still is a soft mobile density consistent with possibly a bursitis or other lesion medially over the patella.  ASSESSMENT/PLAN:   HTN (hypertension) Not at goal.  This is due to the fact that she does not have her amlodipine on board.  Sent to pharmacy follow-up in 1 month.  Cerebrovascular disease We will check a fasting lipid panel today.  Continue clopidogrel.  She is on a high intensity statin.  She does not  have ezetimibe with her and her medications today.  We will call pharmacy to see if this has been dispensed.  Controlled diabetes mellitus type 2 with complications (HCC) Hemoglobin A1c is at goal.  Refilled Jardiance.  She is already on an ACE inhibitor as well as a statin medication.  Continue current therapy.     Left Knee Contusion has follow-up with sports medicine on Wednesday.  We appreciate their consult in  diagnosing the cause of her ongoing symptoms.  Recurrent, intermittent nausea and vomiting unclear etiology.  This could be recurrence of her C. difficile or worsening of her esophagitis.  Differential also includes foodborne illness, less likely hepatitis also considered subdural but  she does not endorse any neurologic symptoms and has not had any falls.  She is also not on anticoagulant.  She has a C. difficile collection system at home.  She will bring this in on Wednesday.  CBC and CMP to evaluate for possible infection or hepatitides today.  Discussed return precautions.  I will call her tomorrow and if her symptoms have worsened she will likely need to proceed to the ER. Phenergan refilled, discussed anticholinergic side effects. Qtc 431 in December, appropriate for Rx.   Healthcare maintenance discussed and encouraged her to get a colonoscopy.  She is thinking about this and seems more amenable after discussion.  She would need to hold her Plavix for this.  If she decides to proceed will message neurology about holding Plavix. Tdap Rx today. Discussed finger DEXA--full DEXA ordered.     Dorris Singh, [place]

## 2021-06-07 ENCOUNTER — Encounter: Payer: Self-pay | Admitting: Family Medicine

## 2021-06-07 ENCOUNTER — Other Ambulatory Visit: Payer: Self-pay

## 2021-06-07 ENCOUNTER — Ambulatory Visit: Payer: PPO | Admitting: Psychologist

## 2021-06-07 ENCOUNTER — Ambulatory Visit (INDEPENDENT_AMBULATORY_CARE_PROVIDER_SITE_OTHER): Payer: PPO | Admitting: Family Medicine

## 2021-06-07 VITALS — BP 155/61 | HR 54 | Ht 62.0 in | Wt 154.6 lb

## 2021-06-07 DIAGNOSIS — R112 Nausea with vomiting, unspecified: Secondary | ICD-10-CM | POA: Diagnosis not present

## 2021-06-07 DIAGNOSIS — E118 Type 2 diabetes mellitus with unspecified complications: Secondary | ICD-10-CM

## 2021-06-07 DIAGNOSIS — Z780 Asymptomatic menopausal state: Secondary | ICD-10-CM

## 2021-06-07 DIAGNOSIS — I679 Cerebrovascular disease, unspecified: Secondary | ICD-10-CM | POA: Diagnosis not present

## 2021-06-07 DIAGNOSIS — I10 Essential (primary) hypertension: Secondary | ICD-10-CM

## 2021-06-07 DIAGNOSIS — Z23 Encounter for immunization: Secondary | ICD-10-CM | POA: Diagnosis not present

## 2021-06-07 DIAGNOSIS — Z794 Long term (current) use of insulin: Secondary | ICD-10-CM | POA: Diagnosis not present

## 2021-06-07 LAB — POCT GLYCOSYLATED HEMOGLOBIN (HGB A1C): HbA1c, POC (controlled diabetic range): 6.7 % (ref 0.0–7.0)

## 2021-06-07 MED ORDER — AMLODIPINE BESYLATE 10 MG PO TABS: 10.0000 mg | ORAL_TABLET | Freq: Every day | ORAL | 3 refills | Status: DC

## 2021-06-07 MED ORDER — TETANUS-DIPHTH-ACELL PERTUSSIS 5-2.5-18.5 LF-MCG/0.5 IM SUSY
0.5000 mL | PREFILLED_SYRINGE | Freq: Once | INTRAMUSCULAR | 0 refills | Status: DC
Start: 2021-06-07 — End: 2021-06-07

## 2021-06-07 MED ORDER — EMPAGLIFLOZIN 10 MG PO TABS: 10.0000 mg | ORAL_TABLET | Freq: Every day | ORAL | 3 refills | Status: DC

## 2021-06-07 MED ORDER — PROMETHAZINE HCL 25 MG PO TABS: 25.0000 mg | ORAL_TABLET | Freq: Four times a day (QID) | ORAL | 0 refills | Status: DC | PRN

## 2021-06-07 MED ORDER — SHINGRIX 50 MCG/0.5ML IM SUSR: 0.5000 mL | INTRAMUSCULAR | 0 refills | Status: AC

## 2021-06-07 MED ORDER — TETANUS-DIPHTH-ACELL PERTUSSIS 5-2.5-18.5 LF-MCG/0.5 IM SUSY: 0.5000 mL | PREFILLED_SYRINGE | Freq: Once | INTRAMUSCULAR | 0 refills | Status: AC

## 2021-06-07 MED ORDER — SHINGRIX 50 MCG/0.5ML IM SUSR: 0.5000 mL | INTRAMUSCULAR | 0 refills | Status: DC

## 2021-06-07 NOTE — Assessment & Plan Note (Signed)
We will check a fasting lipid panel today.  Continue clopidogrel.  She is on a high intensity statin.  She does not have ezetimibe with her and her medications today.  We will call pharmacy to see if this has been dispensed.

## 2021-06-07 NOTE — Patient Instructions (Addendum)
It was wonderful to see you today.  Please bring ALL of your medications with you to every visit.   Today we talked about:   Go to the lab today--I will call you with results  I send in your nausea medication--please be cautious when taking this  Bring back your C. Difficile sample later this week     Your bone density test by the home nurse is the typical test. I recommend a DEXA of the pelvis/back  ---  I recommend a bone density test. I ordered this test. This is to test the strength of your bones. It is like an x-ray. You need to call to schedule [phone]. This is located at [address]---the same place mammograms are completed.   Thank you for choosing [facility].   Please call [phone] with any questions about today's appointment.  Please be sure to schedule follow up at the front  desk before you leave today.   Dorris Singh, MD  Family Medicine

## 2021-06-07 NOTE — Assessment & Plan Note (Signed)
Hemoglobin A1c is at goal.  Refilled Jardiance.  She is already on an ACE inhibitor as well as a statin medication.  Continue current therapy.

## 2021-06-07 NOTE — Assessment & Plan Note (Signed)
Not at goal.  This is due to the fact that she does not have her amlodipine on board.  Sent to pharmacy follow-up in 1 month.

## 2021-06-08 ENCOUNTER — Telehealth: Payer: Self-pay | Admitting: Family Medicine

## 2021-06-08 LAB — CBC WITH DIFFERENTIAL/PLATELET
Basophils Absolute: 0 10*3/uL (ref 0.0–0.2)
Basos: 1 %
EOS (ABSOLUTE): 0.2 10*3/uL (ref 0.0–0.4)
Eos: 3 %
Hematocrit: 31.8 % — ABNORMAL LOW (ref 34.0–46.6)
Hemoglobin: 10.5 g/dL — ABNORMAL LOW (ref 11.1–15.9)
Immature Grans (Abs): 0 10*3/uL (ref 0.0–0.1)
Immature Granulocytes: 0 %
Lymphocytes Absolute: 1.6 10*3/uL (ref 0.7–3.1)
Lymphs: 20 %
MCH: 25.7 pg — ABNORMAL LOW (ref 26.6–33.0)
MCHC: 33 g/dL (ref 31.5–35.7)
MCV: 78 fL — ABNORMAL LOW (ref 79–97)
Monocytes Absolute: 0.4 10*3/uL (ref 0.1–0.9)
Monocytes: 6 %
Neutrophils Absolute: 5.6 10*3/uL (ref 1.4–7.0)
Neutrophils: 70 %
Platelets: 271 10*3/uL (ref 150–450)
RBC: 4.09 x10E6/uL (ref 3.77–5.28)
RDW: 14.6 % (ref 11.7–15.4)
WBC: 7.9 10*3/uL (ref 3.4–10.8)

## 2021-06-08 LAB — COMPREHENSIVE METABOLIC PANEL
ALT: 18 IU/L (ref 0–32)
AST: 21 IU/L (ref 0–40)
Albumin/Globulin Ratio: 1.9 (ref 1.2–2.2)
Albumin: 4.2 g/dL (ref 3.7–4.7)
Alkaline Phosphatase: 107 IU/L (ref 44–121)
BUN/Creatinine Ratio: 18 (ref 12–28)
BUN: 29 mg/dL — ABNORMAL HIGH (ref 8–27)
Bilirubin Total: 0.4 mg/dL (ref 0.0–1.2)
CO2: 22 mmol/L (ref 20–29)
Calcium: 9.3 mg/dL (ref 8.7–10.3)
Chloride: 101 mmol/L (ref 96–106)
Creatinine, Ser: 1.59 mg/dL — ABNORMAL HIGH (ref 0.57–1.00)
Globulin, Total: 2.2 g/dL (ref 1.5–4.5)
Glucose: 114 mg/dL — ABNORMAL HIGH (ref 70–99)
Potassium: 5 mmol/L (ref 3.5–5.2)
Sodium: 138 mmol/L (ref 134–144)
Total Protein: 6.4 g/dL (ref 6.0–8.5)
eGFR: 33 mL/min/{1.73_m2} — ABNORMAL LOW (ref >59)

## 2021-06-08 LAB — LIPID PANEL
Chol/HDL Ratio: 3.7 ratio (ref 0.0–4.4)
Cholesterol, Total: 180 mg/dL (ref 100–199)
HDL: 49 mg/dL (ref >39)
LDL Chol Calc (NIH): 108 mg/dL — ABNORMAL HIGH (ref 0–99)
Triglycerides: 127 mg/dL (ref 0–149)
VLDL Cholesterol Cal: 23 mg/dL (ref 5–40)

## 2021-06-08 NOTE — Telephone Encounter (Signed)
Called patient with results. No further vomiting, no further diarrhea. Nausea has also improved. Has had stable CKD IIB, needs referral to Nephrology at follow up. Briefly discussed, discussed CKD again. Monitor GI  symptoms. All questions answered.   Dorris Singh, MD  Family Medicine Teaching Service

## 2021-06-08 NOTE — Telephone Encounter (Signed)
Reviewed, completed, and signed form. Please attach medication list and problem list.   Note routed to RN team inbasket and placed completed form in Clinic RN's office (wall pocket above desk).  Martyn Malay, MD

## 2021-06-08 NOTE — Telephone Encounter (Signed)
Medication list and problem list attached. Called patient who would like to come by and pick up. A copy was made for batch scanning and a copy left up front for her.

## 2021-06-09 ENCOUNTER — Ambulatory Visit (INDEPENDENT_AMBULATORY_CARE_PROVIDER_SITE_OTHER): Payer: PPO | Admitting: Sports Medicine

## 2021-06-09 VITALS — BP 184/50 | Ht 62.0 in | Wt 154.0 lb

## 2021-06-09 DIAGNOSIS — M25562 Pain in left knee: Secondary | ICD-10-CM

## 2021-06-09 NOTE — Progress Notes (Signed)
PCP: Martyn Malay, MD  Subjective:   HPI: Lauren Craig is a pleasant 76 y.o. female here for evaluation of left knee pain > 2 months.   Patient presents with her daughter who does help provide some of HPI.  At the end of October/beginning of November the patient did have a fall in which she tripped over her purse strap and fell onto the anterior aspect of the left knee.  She was seen on 03/30/2021 at the family medicine office and diagnosed with prepatellar bursitis and had x-rays obtained to rule out any evidence of fracture.  The x-ray at that time did not show any evidence of acute fracture or dislocation.  Was able to walk, although was limping.  She had been icing and applying Ace wrap to the knee for some support.  She has taken Tylenol a few times with some relief.  Her initial pain was a 12/10 however over the past 2 months it has improved with conservative management.  Today her knee pain is a 2/10.  She has pain over the anterior medial aspect of the knee.  She really there was a high degree of swelling and significant bruising after the fall although her swelling has resolved now and she just has some residual light bruising.  She is on blood thinners and does bruise easily per the patient.  She has been able to walk without assistance.  She presented today to make sure there is no additional treatment or concerning findings for the knee.  Past Medical History:  Diagnosis Date   Anxiety disorder    Arthritis    hands, hips   C. 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     albuterol (VENTOLIN HFA) 108 (90 Base) MCG/ACT inhaler Inhale 2 puffs into the lungs every 6 (six) hours as needed for wheezing or shortness of breath. 18 g 3   amLODipine (NORVASC) 10 MG tablet Take 1 tablet (10 mg total) by mouth daily. 90 tablet 3   atorvastatin (LIPITOR) 80 MG tablet Take 1 tablet (80 mg total) by mouth at bedtime. 90 tablet 3   carvedilol (COREG) 6.25 MG tablet Take 6.25 mg by mouth daily.     clopidogrel (PLAVIX) 75 MG tablet Take 75 mg by mouth daily.     empagliflozin (JARDIANCE) 10 MG TABS tablet Take 1 tablet (10 mg total) by mouth daily. 90 tablet 3   enalapril (VASOTEC) 20 MG tablet Take 1 tablet (20 mg total) by mouth daily. 90 tablet 3   ezetimibe (ZETIA) 10 MG tablet Take 1 tablet (10 mg total) by mouth daily. 90 tablet 3   famotidine (PEPCID) 10 MG tablet Take 1 tablet (10 mg total) by mouth daily. 90 tablet 3   fluticasone (FLONASE) 50 MCG/ACT nasal spray Place 2 sprays into both nostrils daily.     indapamide (LOZOL) 2.5 MG tablet Take 1 tablet (2.5 mg total) by mouth daily. 90 tablet 3  nystatin cream (MYCOSTATIN) Apply 1 application topically 2 (two) times daily. 90 g 1   OVER THE COUNTER MEDICATION Take 1 tablet by mouth daily. Prevagen - once daily     pantoprazole (PROTONIX) 40 MG tablet Take 40 mg by mouth daily.     promethazine (PHENERGAN) 25 MG tablet Take 1 tablet (25 mg total) by mouth every 6 (six) hours as needed for nausea or vomiting. As needed 30 tablet 0   sertraline (ZOLOFT) 25 MG tablet Take 1 tablet (25 mg total) by mouth daily. 90 tablet 3   sucralfate (CARAFATE) 1 g tablet Take 1 tablet (1 g total) by mouth daily before supper. 90 tablet 3   triamcinolone ointment (KENALOG) 0.1 % Apply 1 application topically 2 (two) times daily. 80 g 1   Zoster Vaccine Adjuvanted [facility]) injection Inject 0.5 mLs into the muscle every 2 (two) months for 2 doses. 1 mL 0   No current  facility-administered medications on file prior to visit.    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;  Surgeon: Wylene Simmer, MD;  Location: [facility];  Service: Orthopedics;  Laterality: Left;   BIOPSY  10/29/2020   Procedure: BIOPSY;  Surgeon: Irene Shipper, MD;  Location: [facility] ENDOSCOPY;  Service: Endoscopy;;   ESOPHAGOGASTRODUODENOSCOPY (EGD) WITH PROPOFOL N/A 10/29/2020   Procedure: ESOPHAGOGASTRODUODENOSCOPY (EGD) WITH PROPOFOL;  Surgeon: Irene Shipper, MD;  Location[facility] ENDOSCOPY;  Service: Endoscopy;  Laterality: N/A;   PARTIAL HYMENECTOMY  1968   TONSILLECTOMY AND ADENOIDECTOMY     VAGINAL HYSTERECTOMY  1987    Allergies  Allergen Reactions   Penicillins Hives, Rash and Other (See Comments)    Did it involve swelling of the face/tongue/throat, SOB, or low BP? Yes Did it involve sudden or severe rash/hives, skin peeling, or any reaction on the inside of your mouth or nose? No Did you need to seek medical attention at a hospital or doctor's office? Yes When did it last happen? ? If all above answers are NO, may proceed with cephalosporin use.    Insulin Lispro Prot & Lispro Other (See Comments)    Site of injection was inflamed, red, painful episode   Semaglutide Nausea And Vomiting   Metformin Diarrhea    There were no vitals taken for this visit.  No flowsheet data found.  No flowsheet data found.   *Independent review of left knee x-ray from 03/30/2021: -There  is no evidence of acute fracture dislocations.  There is clear osteopenic diminished bone mineral density.  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      Objective:  Physical Exam:  Gen: Well-appearing, in no acute distress; non-toxic CV: Regular Rate. Well-perfused. Warm.  Resp: Breathing unlabored on room air; no wheezing. Psych: Fluid speech in conversation; appropriate affect; normal thought process Neuro: Sensation intact throughout. No gross coordination deficits.  MSK:  - Left knee: + Mild TTP noted over the anterior joint line.  No patellar pole tenderness, no lateral joint line tenderness.  Sectioning  of some residual ecchymosis of the medial aspect of the patella, no erythema or effusion, no warmth.  There is no knee crepitus with flexion/extension.  There is no varus or valgus instability at 0 or 30 degrees.  Range of motion from approximately 3 degrees - 130 degrees in flexion.  Strength is 5/5 with knee flexion and extension.  Neurovascular intact distally.  Negative Lachman, anterior/posterior drawer, negative McMurray's test.    Assessment & Plan:  1. Knee contusion - subsequent follow-up, after fall onto knee just over 2 months ago - improving.  I did review her previous x-rays which do not show any evidence of acute fracture.  There is clear chondrocalcinosis, although this is likely chronic and not contributing to her pain.  Her pain has significantly diminished with conservative treatment.  At this time, we discussed continuing conservative management with ice/heat, may take over-the-counter Tylenol as needed.  I did instruct her she is able to ambulate without restrictions.  If her pain worsens or does not continue to improve over the next 6 weeks, she may follow-up.  Otherwise may follow-up as needed.  Elba Barman, DO PGY-4, Sports Medicine Fellow [place]  Addendum:  I was the preceptor for this  visit and available for immediate consultation.  Karlton Lemon MD [facility] Boys

## 2021-06-16 ENCOUNTER — Ambulatory Visit: Payer: PPO

## 2021-06-17 NOTE — Patient Instructions (Signed)
Visit Information  Lauren Craig  it was nice speaking with you. Please call me directly [phone] if you have questions about the goals we discussed.  Patient Goals/Self Care Activities: -Patient/Caregiver will self-administer medications as prescribed as evidenced by self-report/primary caregiver report ,  -Patient/Caregiver will attend all scheduled provider appointments as evidenced by clinician review of documented attendance to scheduled appointments and patient/caregiver report,  -Patient/Caregiver will call pharmacy for medication refills as evidenced by patient report and review of pharmacy fill history as appropriate,  -Patient/Caregiver will call provider office for new concerns or questions as evidenced by review of documented incoming telephone call notes and patient report, Patient/Caregiver verbalizes understanding of plan, -Patient/Caregiver will focus on medication adherence by taking all medications as prescribed -Calls provider office for new concerns, questions, or BP outside discussed parameters -Checks BP and records as discussed -Follows a low sodium diet/DASH diet -Make sure to have the pharmacist to call and have her Enalapril prescription discussed with Dr. Owens Shark      Patient verbalizes understanding of instructions and care plan provided today and agrees to view in [place]. Active MyChart status confirmed with patient.    Follow up Plan: Patient would like continued follow-up.  CCM RNCM will outreach the patient within the next 5 weeks.  Patient will call office if needed prior to next encounter  Lazaro Arms, RN  [phone]

## 2021-06-17 NOTE — Chronic Care Management (AMB) (Signed)
Chronic Care Management   CCM RN Visit Note  06/17/2021 Name: Lauren Craig MRN: [id number] DOB: [birth year]  Subjective: Lauren Craig is a 76 y.o. year old female who is a primary care patient of Martyn Malay, MD. The care management team was consulted for assistance with disease management and care coordination needs.    Engaged with patient by telephone for follow up visit in response to provider referral for case management and/or care coordination services.   Consent to Services:  The patient was given information about Chronic Care Management services, agreed to services, and gave verbal consent prior to initiation of services.  Please see initial visit note for detailed documentation.   Patient agreed to services and verbal consent obtained.    Summary:  The patient  is making progress with elevated blood pressures . See Care Plan below for interventions and patient self-care actives.  Recommendation: I have advised the patient to discuss with Dr. Owens Shark that she is taking 2 pills of her Enalapril instead of 1 pill a day.  I will send information to Dr Owens Shark informing her of the information as well.  The patient verbalized understanding.  Follow up Plan: Patient would like continued follow-up.  CCM RNCM will outreach the patient within the next 5 weeks.  Patient will call office if needed prior to next encounter   Assessment: Review of patient past medical history, allergies, medications, health status, including review of consultants reports, laboratory and other test data, was performed as part of comprehensive evaluation and provision of chronic care management services.   SDOH (Social Determinants of Health) assessments and interventions performed:    CCM Care Plan   Conditions to be addressed/monitored:HTN  Care Plan : RN Care Manager  Updates made by Lazaro Arms, RN since 06/17/2021 12:00 AM     Problem: Hypertension      Long-Range Goal: The patient wil  monitor and maintain her BP within given range   Start Date: 02/10/2021  Expected End Date: 07/27/2021  Priority: High  Note:   Current Barriers:  Knowledge Deficits related to plan of care for management of HTN  Chronic Disease Management support and education needs related to HTN  RNCM Clinical Goal(s):  Patient will verbalize understanding of plan for management of HTN through collaboration with RN Care manager, provider, and care team.   Interventions: 1:1 collaboration with primary care provider regarding development and update of comprehensive plan of care as evidenced by provider attestation and co-signature Inter-disciplinary care team collaboration (see longitudinal plan of care) Evaluation of current treatment plan related to  self management and patient's adherence to plan as established by provider   Hypertension: (Status: New goal. Goal on track: NO.) Long term Last practice recorded BP readings:  BP Readings from Last 3 Encounters:  06/09/21 (!) 184/50  06/07/21 (!) 155/61  06/01/21 (!) 148/66  Most recent eGFR/CrCl:  Lab Results  Component Value Date   EGFR 33 (L) 06/07/2021    No components found for: CRCL  Evaluation of current treatment plan related to hypertension self management and patient's adherence to plan as established by provider;   Reviewed prescribed diet low sodium Reviewed medications with patient and discussed importance of compliance;  Patient denies any chest pain shortness of breath Discussed plans with patient for ongoing care management follow up and provided patient with direct contact information for care management team; Advised patient, providing education and rationale, to monitor blood pressure daily and record, calling PCP for findings outside established  parameters;  06/16/21: I spoke with [name], and she seemed in good spirits.   She understands that she can still enjoy life even though she is not driving anymore. She goes to church  and visits with old friends. Her blood pressure has been hp some 160/65, but she has no headaches or chest pain. She is taking her medications. She has one medication she is taking, Enalapril; the instructions say to take one daily, but she is taking two daily. I advised her to let Dr brown know 1. because that is not how she prescribed her to take it and 2. she will run out of the medication quicker. Lauren Craig stated that she has talked with the pharmacy about the medication, and they will contact Dr. Owens Shark for her. I will include the information in my note.  Patient Goals/Self Care Activities: -Patient/Caregiver will self-administer medications as prescribed as evidenced by self-report/primary caregiver report ,  -Patient/Caregiver will attend all scheduled provider appointments as evidenced by clinician review of documented attendance to scheduled appointments and patient/caregiver report,  -Patient/Caregiver will call pharmacy for medication refills as evidenced by patient report and review of pharmacy fill history as appropriate,  -Patient/Caregiver will call provider office for new concerns or questions as evidenced by review of documented incoming telephone call notes and patient report, Patient/Caregiver verbalizes understanding of plan, -Patient/Caregiver will focus on medication adherence by taking all medications as prescribed -Calls provider office for new concerns, questions, or BP outside discussed parameters -Checks BP and records as discussed -Follows a low sodium diet/DASH diet -Make sure to have the pharmacist to call and have her Enalapril prescription discussed with Dr. Harold Hedge RN, BSN, [place] Management Coordinator [place]  Phone: [phone]

## 2021-06-18 ENCOUNTER — Telehealth: Payer: Self-pay | Admitting: Family Medicine

## 2021-06-18 NOTE — Telephone Encounter (Signed)
Called regarding enalapril question she is to take 1 per day as K was 5.0 at last check. She will call with BP values end of next week.  .sgi

## 2021-07-05 ENCOUNTER — Other Ambulatory Visit: Payer: Self-pay

## 2021-07-05 ENCOUNTER — Ambulatory Visit (INDEPENDENT_AMBULATORY_CARE_PROVIDER_SITE_OTHER): Payer: PPO | Admitting: Family Medicine

## 2021-07-05 ENCOUNTER — Encounter: Payer: Self-pay | Admitting: Family Medicine

## 2021-07-05 VITALS — BP 194/65 | HR 60 | Wt 156.4 lb

## 2021-07-05 DIAGNOSIS — E7800 Pure hypercholesterolemia, unspecified: Secondary | ICD-10-CM

## 2021-07-05 DIAGNOSIS — E538 Deficiency of other specified B group vitamins: Secondary | ICD-10-CM

## 2021-07-05 DIAGNOSIS — K209 Esophagitis, unspecified without bleeding: Secondary | ICD-10-CM | POA: Diagnosis not present

## 2021-07-05 DIAGNOSIS — I10 Essential (primary) hypertension: Secondary | ICD-10-CM | POA: Diagnosis not present

## 2021-07-05 DIAGNOSIS — I679 Cerebrovascular disease, unspecified: Secondary | ICD-10-CM

## 2021-07-05 MED ORDER — AMLODIPINE BESYLATE 10 MG PO TABS: 10.0000 mg | ORAL_TABLET | Freq: Every day | ORAL | 3 refills | Status: DC

## 2021-07-05 MED ORDER — ONDANSETRON 4 MG PO TBDP: 4.0000 mg | ORAL_TABLET | Freq: Three times a day (TID) | ORAL | 0 refills | Status: DC | PRN

## 2021-07-05 MED ORDER — FAMOTIDINE 10 MG PO TABS: 10.0000 mg | ORAL_TABLET | Freq: Every day | ORAL | 3 refills | Status: DC

## 2021-07-05 MED ORDER — INDAPAMIDE 2.5 MG PO TABS: 2.5000 mg | ORAL_TABLET | Freq: Every day | ORAL | 3 refills | Status: DC

## 2021-07-05 MED ORDER — ENALAPRIL MALEATE 20 MG PO TABS: 20.0000 mg | ORAL_TABLET | Freq: Every day | ORAL | 3 refills | Status: DC

## 2021-07-05 NOTE — Assessment & Plan Note (Signed)
Not at goal.  She is taking only 1.25 mg of indapamide.  Refilled appropriate dose.  Her blood pressure is difficult to manage given her low diastolic and predisposition to falls and we have lowered it in the past.  Additionally her heart rate does not allow for titration of carvedilol.  Some of her measurements at home are within range.  I do suspect that some of this may be related to intermittent's effect efficacy of medications as on days when she is vomiting persistently I suspect she is not digesting or keeping down her blood pressure pills. Plan for below for intermittent vomiting and diarrhea Refilled appropriate medications follow-up in 2 months time for A1c and BMP.

## 2021-07-05 NOTE — Assessment & Plan Note (Signed)
Repeat B12 at follow-up.

## 2021-07-05 NOTE — Patient Instructions (Signed)
It was wonderful to see you today.  Please bring ALL of your medications with you to every visit.   Today we talked about:  - I sent in your blood pressure pills -  I sent in your Nausea pills  CALL GASTROENTEROLOGY   [phone]  Follow up in 2 months---our front desk will call you about a visit      Thank you for choosing [facility].   Please call [phone] with any questions about today's appointment.  Please be sure to schedule follow up at the front  desk before you leave today.   Dorris Singh, MD  Family Medicine

## 2021-07-05 NOTE — Assessment & Plan Note (Signed)
Brings with her medications today.  Repeat direct LDL at follow-up.  Recent lipid panel showed marked improvement when she was compliant with therapy.

## 2021-07-05 NOTE — Progress Notes (Addendum)
SUBJECTIVE:   CHIEF COMPLAINT: blood pressure  HPI:   Lauren Craig is a 76 y.o.  with history notable for cerebrovascular disease, hypertension, very well controlled type 2 diabetes and esophagitis presenting for follow-up for blood pressure.  Patient brings with her all of her medications today.  She is taking enalapril 20 mg, indapamide 1.25 mg, carvedilol 6.125 mg BID.  She brings in with her blood pressure measurements from home.  Measurements range from 120-160/60-70.  Her diastolic is consistently in the low 60s at highest.  She denies headaches, chest pain, dizziness, falls.  She reports overall she is doing well.  She wishes she can get back to driving and does feel very sad about this.  She is glad [name] now has her car.  She is looking forward to the spring.  The patient reports improvement in her gastrointestinal illness from January.  She reports 2 to 3 days a week she still has episodes of vomiting diarrhea and stomach discomfort.  She denies fevers, chills, blood in her stool.  She has mild epigastric and lower pelvic pain with this.  She has not yet followed up with gastroenterology for her colonoscopy.  She is amenable to do this.  PERTINENT  PMH / PSH/Family/Social History : Difficult to control hypertension, type 2 diabetes with a well-controlled A1c, cerebrovascular disease  OBJECTIVE:   BP (!) 194/65    Pulse 60    Wt 156 lb 6.4 oz (70.9 kg)    SpO2 98%    BMI 28.61 kg/m   Today's weight:  Last Weight  Most recent update: 07/05/2021 10:29 AM    Weight  70.9 kg (156 lb 6.4 oz)            Review of prior weights: Filed Weights   07/05/21 1028  Weight: 156 lb 6.4 oz (70.9 kg)    Elderly appearing woman in no distress.  She is slightly hard of hearing.  Mucus membranes appear moist.  Cardiac exam regular rate and rhythm no murmurs rubs or gallops.  Lungs clear there is no edema.  ASSESSMENT/PLAN:   Vitamin B12 deficiency Repeat B12 at  follow-up.  Hyperlipidemia Brings with her medications today.  Repeat direct LDL at follow-up.  Recent lipid panel showed marked improvement when she was compliant with therapy.  HTN (hypertension) Difficult to Control with Isolated Systolic HTN  Not at goal.  She is taking only 1.25 mg of indapamide.  Refilled appropriate dose.  Her blood pressure is difficult to manage given her low diastolic and predisposition to falls and we have lowered it in the past.  Additionally her heart rate does not allow for titration of carvedilol.  Some of her measurements at home are within range.  I do suspect that some of this may be related to intermittent's effect efficacy of medications as on days when she is vomiting persistently I suspect she is not digesting or keeping down her blood pressure pills. Plan for below for intermittent vomiting and diarrhea Refilled appropriate medications follow-up in 2 months time for A1c and BMP.  Cerebrovascular disease Continue statin, ASA, Ezetimibe    Persistent Nausea, Vomiting, Diarrhea discussed importance of following up with gastroenterology.  The patient is amenable to undergoing a colonoscopy.  At this time would be safe for her to come off the aspirin for such a procedure.  Patient thinks this is irritable bowel syndrome, discussed that this is less likely given her severe symptoms and degree of symptoms.  She is  not on a PPI or H2 blocker which is helped with her esophagitis in the past.  Refilled H2 blocker.  Reviewed return precautions at length.  Recent labs unremarkable.  HCM Recently got Tdap and Shingrix   Follow up in 2 weeks via phone call-If BP remains elevated will obtain 24 hour monitor, discuss referral to hypertension specialist (Dr. Oval Linsey)   At follow-up consider B12, ferritin and discussion of creatinine.    Dorris Singh, [place]

## 2021-07-05 NOTE — Assessment & Plan Note (Signed)
Continue statin, ASA, Ezetimibe

## 2021-07-06 ENCOUNTER — Inpatient Hospital Stay: Payer: PPO | Admitting: Physical Medicine & Rehabilitation

## 2021-07-14 ENCOUNTER — Other Ambulatory Visit: Payer: PPO

## 2021-07-14 DIAGNOSIS — R197 Diarrhea, unspecified: Secondary | ICD-10-CM

## 2021-07-14 DIAGNOSIS — R112 Nausea with vomiting, unspecified: Secondary | ICD-10-CM

## 2021-07-15 ENCOUNTER — Telehealth: Payer: Self-pay | Admitting: Family Medicine

## 2021-07-15 NOTE — Telephone Encounter (Signed)
Called patient, she is frustrated by [facility]'s sample process for C. difficile.  Did not take her sample to the Q-tip.  Advised her to call gastroenterology about the appointment.  She has not yet done so.  She does not have her blood pressure readings with her her daughter will message about them.

## 2021-07-23 ENCOUNTER — Ambulatory Visit: Payer: PPO

## 2021-07-23 NOTE — Chronic Care Management (AMB) (Signed)
Chronic Care Management   CCM RN Visit Note  07/23/2021 Name: Aaleyah Witherow MRN: [id number] DOB: 03-Oct-1945  Subjective: Lauren Craig is a 76 y.o. year old female who is a primary care patient of Martyn Malay, MD. The care management team was consulted for assistance with disease management and care coordination needs.    Engaged with patient by telephone for follow up visit in response to provider referral for case management and/or care coordination services.   Consent to Services:  The patient was given information about Chronic Care Management services, agreed to services, and gave verbal consent prior to initiation of services.  Please see initial visit note for detailed documentation.   Patient agreed to services and verbal consent obtained.    Summary:  The patient continues to maintain positive progress with care plan goals but continues to experience difficulty with elevated blood pressures... See Care Plan below for interventions and patient self-care actives.  Recommendation: The patient may benefit from continuing to take her medications as prescribed, stay active as tolerated, and monitor her diet. The patient agrees.  Follow up Plan: Patient would like continued follow-up.  CCM RNCM will outreach the patient within the next 5 weeks.  Patient will call office if needed prior to next encounter  Assessment: Review of patient past medical history, allergies, medications, health status, including review of consultants reports, laboratory and other test data, was performed as part of comprehensive evaluation and provision of chronic care management services.   SDOH (Social Determinants of Health) assessments and interventions performed:  No   Conditions to be addressed/monitored:HTN  Care Plan : RN Care Manager  Updates made by Lazaro Arms, RN since 07/23/2021 12:00 AM     Problem: Hypertension      Long-Range Goal: The patient wil monitor and maintain her BP within  given range   Start Date: 02/10/2021  Expected End Date: 09/24/2021  Priority: High  Note:   Current Barriers:  Knowledge Deficits related to plan of care for management of HTN  Chronic Disease Management support and education needs related to HTN  RNCM Clinical Goal(s):  Patient will verbalize understanding of plan for management of HTN through collaboration with RN Care manager, provider, and care team.   Interventions: 1:1 collaboration with primary care provider regarding development and update of comprehensive plan of care as evidenced by provider attestation and co-signature Inter-disciplinary care team collaboration (see longitudinal plan of care) Evaluation of current treatment plan related to  self management and patient's adherence to plan as established by provider   Hypertension: (Status: New goal. Goal on track: NO.) Long term Last practice recorded BP readings:  BP Readings from Last 3 Encounters:  07/05/21 (!) 194/65  06/09/21 (!) 184/50  06/07/21 (!) 155/61  Most recent eGFR/CrCl:  Lab Results  Component Value Date   EGFR 33 (L) 06/07/2021    No components found for: CRCL  Evaluation of current treatment plan related to hypertension self management and patient's adherence to plan as established by provider;   Reviewed medications with patient and discussed importance of compliance;  Patient denies any chest pain shortness of breath Discussed plans with patient for ongoing care management follow up and provided patient with direct contact information for care management team; 07/23/21: Mrs. Cushman said she was doing good. She denies any chest pain or flushing. Her last blood pressure reading on Thursday was 168/68. We discussed that this reading is better than most of her readings. I encouraged her to continue to  take her medications as prescribed, stay active as tolerated, and monitor her diet.   Patient Goals/Self Care Activities: -Patient/Caregiver will  self-administer medications as prescribed as evidenced by self-report/primary caregiver report ,  -Patient/Caregiver will attend all scheduled provider appointments as evidenced by clinician review of documented attendance to scheduled appointments and patient/caregiver report,  -Patient/Caregiver will call pharmacy for medication refills as evidenced by patient report and review of pharmacy fill history as appropriate,  -Patient/Caregiver will call provider office for new concerns or questions as evidenced by review of documented incoming telephone call notes and patient report, Patient/Caregiver verbalizes understanding of plan, -Patient/Caregiver will focus on medication adherence by taking all medications as prescribed -Calls provider office for new concerns, questions, or BP outside discussed parameters -Checks BP and records as discussed -Follows a low sodium diet/DASH diet     Lazaro Arms RN, BSN, [place] Management Coordinator [place]  Phone: [phone]

## 2021-07-23 NOTE — Patient Instructions (Signed)
Visit Information  Ms. Hartinger  it was nice speaking with you. Please call me directly [phone] if you have questions about the goals we discussed.  Patient Goals/Self Care Activities: -Patient/Caregiver will self-administer medications as prescribed as evidenced by self-report/primary caregiver report ,  -Patient/Caregiver will attend all scheduled provider appointments as evidenced by clinician review of documented attendance to scheduled appointments and patient/caregiver report,  -Patient/Caregiver will call pharmacy for medication refills as evidenced by patient report and review of pharmacy fill history as appropriate,  -Patient/Caregiver will call provider office for new concerns or questions as evidenced by review of documented incoming telephone call notes and patient report, Patient/Caregiver verbalizes understanding of plan, -Patient/Caregiver will focus on medication adherence by taking all medications as prescribed -Calls provider office for new concerns, questions, or BP outside discussed parameters -Checks BP and records as discussed -Follows a low sodium diet/DASH diet       Patient verbalizes understanding of instructions and care plan provided today and agrees to view in [place]. Active MyChart status confirmed with patient.    Follow up Plan: Patient would like continued follow-up.  CCM RNCM will outreach the patient within the next 5 weeks.  Patient will call office if needed prior to next encounter  Lazaro Arms, RN  [phone]

## 2021-07-26 ENCOUNTER — Other Ambulatory Visit: Payer: Self-pay | Admitting: *Deleted

## 2021-07-26 DIAGNOSIS — K209 Esophagitis, unspecified without bleeding: Secondary | ICD-10-CM

## 2021-07-26 MED ORDER — ONDANSETRON 4 MG PO TBDP: 4.0000 mg | ORAL_TABLET | Freq: Three times a day (TID) | ORAL | 0 refills | Status: DC | PRN

## 2021-07-26 NOTE — Telephone Encounter (Signed)
Attempted to call patient about refill. Left generic voicemail to call back and discuss GI symptoms. Medication refilled.   Dorris Singh, MD  Family Medicine Teaching Service

## 2021-07-28 ENCOUNTER — Other Ambulatory Visit (HOSPITAL_COMMUNITY): Payer: Self-pay

## 2021-07-28 ENCOUNTER — Telehealth: Payer: Self-pay

## 2021-07-28 NOTE — Telephone Encounter (Signed)
A Prior Authorization was initiated for this patients [place] through CoverMyMeds. Faxed form via covermymeds to [place] [employer]. ? ?Key: [id number] ? ?

## 2021-07-29 ENCOUNTER — Encounter: Payer: Self-pay | Admitting: Physical Medicine & Rehabilitation

## 2021-07-29 ENCOUNTER — Other Ambulatory Visit: Payer: Self-pay

## 2021-07-29 ENCOUNTER — Encounter: Payer: PPO | Attending: Registered Nurse | Admitting: Physical Medicine & Rehabilitation

## 2021-07-29 VITALS — BP 237/87 | HR 65 | Ht 62.0 in | Wt 167.8 lb

## 2021-07-29 DIAGNOSIS — I69398 Other sequelae of cerebral infarction: Secondary | ICD-10-CM | POA: Insufficient documentation

## 2021-07-29 DIAGNOSIS — R269 Unspecified abnormalities of gait and mobility: Secondary | ICD-10-CM | POA: Insufficient documentation
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
## 2021-07-29 NOTE — Patient Instructions (Signed)
Keep up with HEP ?

## 2021-07-30 ENCOUNTER — Other Ambulatory Visit (HOSPITAL_COMMUNITY): Payer: Self-pay

## 2021-08-02 ENCOUNTER — Other Ambulatory Visit: Payer: Self-pay | Admitting: Family Medicine

## 2021-08-02 DIAGNOSIS — I679 Cerebrovascular disease, unspecified: Secondary | ICD-10-CM

## 2021-08-02 DIAGNOSIS — I739 Peripheral vascular disease, unspecified: Secondary | ICD-10-CM

## 2021-08-02 NOTE — Progress Notes (Signed)
Community message sent to [employer] for [employer]. Will await response.  ? ?Talbot Grumbling, RN ? ?

## 2021-08-04 ENCOUNTER — Telehealth: Payer: Self-pay

## 2021-08-04 NOTE — Telephone Encounter (Signed)
Called patient.  Patient tearful on phone.  After end of 15-minute conversation she did improve slightly.  She reports feeling isolated sad and lonely.  Denies thoughts of hurting herself or others.  She reports she just wants to get out of the house.  She is sick of not having independence and really wants her car back.  At the end of the conversation we agreed that I would reach out to her daughter for a visit in the coming days.  Patient is to call back tomorrow morning to let me know how she is feeling.  May need addition of therapy to ongoing treatment of depression.  I suspect this is an acute stress reaction related to her decline in function over the past 6 months. ? ?Attempted to call daughter X2--sent her mychart.  ? ?Dorris Singh, MD  ?Family Medicine Teaching Service  ? ?

## 2021-08-04 NOTE — Telephone Encounter (Signed)
Patient calls nurse line requesting to speak with Dr. Owens Shark regarding elevated BP readings. Patient reports most recent BP reading of 238/83. Patient reports compliance with BP medications. States that she wakes up every morning with a headache, this has been going on for approx one week. Patient denies chest pain, blurry vision or SHOB. Offered to schedule patient same day appointment, patient declined as she does not have transportation.  ? ?Patient very tearful on the phone. Stating, "I don't have anyone to help me, I'm tired of feeling this way."  ? ?Precepted with Dr. Owens Shark. Advised to provide patient with ED precautions and that she will return call to patient later to discuss further.  ? ?Provided patient with ED precautions. Patient has also informed daughter of above. Patient is appreciative.  ? ?Talbot Grumbling, RN ? ?

## 2021-08-05 ENCOUNTER — Telehealth: Payer: Self-pay | Admitting: Family Medicine

## 2021-08-05 ENCOUNTER — Other Ambulatory Visit (HOSPITAL_COMMUNITY): Payer: Self-pay

## 2021-08-05 NOTE — Telephone Encounter (Signed)
Fax rec'd. ? ?Prior Auth for patients medication ONDANSETRON 4MG  ODT TABS approved by [employer] from 08/05/21 to 08/06/22.  ? ?Approval letter scanned to pt chart. ? ?Patients pharmacy notified. ? ?

## 2021-08-05 NOTE — Telephone Encounter (Signed)
Called patient, doing better. Discussed going to senior center, she is already a member. Scheduled for PCP follow up. ? ?Dorris Singh, MD  ?Family Medicine Teaching Service  ? ?

## 2021-08-06 ENCOUNTER — Encounter: Payer: Self-pay | Admitting: Family Medicine

## 2021-08-09 NOTE — Progress Notes (Signed)
Receipt confirmed by [employer].   Norvel Wenker C Laiza Veenstra, RN  

## 2021-08-18 DIAGNOSIS — M1990 Unspecified osteoarthritis, unspecified site: Secondary | ICD-10-CM | POA: Diagnosis not present

## 2021-08-18 DIAGNOSIS — I679 Cerebrovascular disease, unspecified: Secondary | ICD-10-CM | POA: Diagnosis not present

## 2021-08-18 DIAGNOSIS — H5340 Unspecified visual field defects: Secondary | ICD-10-CM | POA: Diagnosis not present

## 2021-08-20 ENCOUNTER — Ambulatory Visit: Payer: PPO

## 2021-08-20 NOTE — Chronic Care Management (AMB) (Signed)
?Chronic Care Management  ? ?CCM RN Visit Note ? ?08/20/2021 ?Name: Lauren Craig MRN: [id number] DOB: 10-17-1945 ? ?Subjective: ?Lauren Craig is a 76 y.o. year old female who is a primary care patient of Martyn Malay, MD. The care management team was consulted for assistance with disease management and care coordination needs.   ? ?Engaged with patient by telephone for follow up visit in response to provider referral for case management and/or care coordination services.  ? ?Consent to Services:  ?The patient was given information about Chronic Care Management services, agreed to services, and gave verbal consent prior to initiation of services.  Please see initial visit note for detailed documentation.  ? ?Patient agreed to services and verbal consent obtained.  ? ? ?Summary:  The patient continues to experience difficulty with elevated blood pressures.. See Care Plan below for interventions and patient self-care actives. ? ?Recommendation: The patient may benefit from taking medications as prescribed, monitoring food intake, watch fluid intake, checking blood pressures and record values, and calling your physician if numbers are abnormal ? ?Follow up Plan: Patient would like continued follow-up.  CCM RNCM will outreach the patient within the next 5 weeks.  Patient will call office if needed prior to next encounter ? ? ?Assessment: Review of patient past medical history, allergies, medications, health status, including review of consultants reports, laboratory and other test data, was performed as part of comprehensive evaluation and provision of chronic care management services.  ? ?SDOH (Social Determinants of Health) assessments and interventions performed:  No ? ?CCM Care Plan ? ?Conditions to be addressed/monitored:HTN ? ?Care Plan : RN Care Manager  ?Updates made by Lazaro Arms, RN since 08/20/2021 12:00 AM  ?  ? ?Problem: Hypertension   ?  ? ?Long-Range Goal: The patient wil monitor and maintain her BP  within given range   ?Start Date: 02/10/2021  ?Expected End Date: 10/27/2021  ?Priority: High  ?Note:   ?Current Barriers:  ?Knowledge Deficits related to plan of care for management of HTN  ?Chronic Disease Management support and education needs related to HTN ? ?RNCM Clinical Goal(s):  ?Patient will verbalize understanding of plan for management of HTN through collaboration with RN Care manager, provider, and care team.  ? ?Interventions: ?1:1 collaboration with primary care provider regarding development and update of comprehensive plan of care as evidenced by provider attestation and co-signature ?Inter-disciplinary care team collaboration (see longitudinal plan of care) ?Evaluation of current treatment plan related to  self management and patient's adherence to plan as established by provider ? ? ?Hypertension: (Status: New goal. Goal on track: NO.) Long term ?Last practice recorded BP readings:  ?BP Readings from Last 3 Encounters:  ?07/29/21 (!) 237/87  ?07/05/21 (!) 194/65  ?06/09/21 (!) 184/50  ?Most recent eGFR/CrCl:  ?Lab Results  ?Component Value Date  ? EGFR 33 (L) 06/07/2021  ?  No components found for: CRCL ? ?Evaluation of current treatment plan related to hypertension self management and patient's adherence to plan as established by provider;   ?Reviewed medications with patient and discussed importance of compliance;  ?Patient denies any chest pain shortness of breath ?Discussed plans with patient for ongoing care management follow up and provided patient with direct contact information for care management team; ?324//23: I spoke with Mrs.s Gallon; she denies having any chest pain, shortness of breath, or flushing in her face. She states that she has headaches but does not feel it comes from her blood pressure. She has not checked her blood  pressure this week, but last week  on the  ?12th 179/75 ?13th 175/79 ?[id number]/93 ?17th 199/69 ?She said she is moderating her diet as much as she can for sodium  and taking all her medications. She has a follow-up with Dr. Owens Shark on 08/27/21 at 1050 am. ? ? ?Patient Goals/Self Care Activities: ?-Patient/Caregiver will self-administer medications as prescribed as evidenced by self-report/primary caregiver report ,  ?-Patient/Caregiver will attend all scheduled provider appointments as evidenced by clinician review of documented attendance to scheduled appointments and patient/caregiver report,  ?-Patient/Caregiver will call pharmacy for medication refills as evidenced by patient report and review of pharmacy fill history as appropriate,  ?-Patient/Caregiver will call provider office for new concerns or questions as evidenced by review of documented incoming telephone call notes and patient report, ?Patient/Caregiver verbalizes understanding of plan, ?-Patient/Caregiver will focus on medication adherence by taking all medications as prescribed ?-Calls provider office for new concerns, questions, or BP outside discussed parameters ?-Checks BP and records as discussed ?-Follows a low sodium diet/DASH diet ? ?  ? ?Lazaro Arms RN, BSN, [place] ?Care Management Coordinator ?[place]  ?Phone: [phone]  ?  ? ? ? ? ? ? ? ?

## 2021-08-20 NOTE — Patient Instructions (Signed)
Visit Information ? ?Ms. Lasalle  it was nice speaking with you. Please call me directly [phone] if you have questions about the goals we discussed. ? ?Patient Goals/Self Care Activities: ?-Patient/Caregiver will self-administer medications as prescribed as evidenced by self-report/primary caregiver report ,  ?-Patient/Caregiver will attend all scheduled provider appointments as evidenced by clinician review of documented attendance to scheduled appointments and patient/caregiver report,  ?-Patient/Caregiver will call pharmacy for medication refills as evidenced by patient report and review of pharmacy fill history as appropriate,  ?-Patient/Caregiver will call provider office for new concerns or questions as evidenced by review of documented incoming telephone call notes and patient report, ?Patient/Caregiver verbalizes understanding of plan, ?-Patient/Caregiver will focus on medication adherence by taking all medications as prescribed ?-Calls provider office for new concerns, questions, or BP outside discussed parameters ?-Checks BP and records as discussed ?-Follows a low sodium diet/DASH diet ? ? ?The patient verbalized understanding of instructions, educational materials, and care plan provided today and agreed to receive a mailed copy of patient instructions, educational materials, and care plan.  ? ?Follow up Plan: Patient would like continued follow-up.  CCM RNCM will outreach the patient within the next 5 weeks.  Patient will call office if needed prior to next encounter ? ?Lazaro Arms, RN ? ?[phone]  ?

## 2021-08-27 ENCOUNTER — Ambulatory Visit (INDEPENDENT_AMBULATORY_CARE_PROVIDER_SITE_OTHER): Payer: PPO | Admitting: Family Medicine

## 2021-08-27 ENCOUNTER — Other Ambulatory Visit: Payer: PPO

## 2021-08-27 ENCOUNTER — Encounter: Payer: Self-pay | Admitting: Family Medicine

## 2021-08-27 VITALS — BP 141/60 | HR 62 | Temp 97.7°F | Ht 62.0 in | Wt 165.2 lb

## 2021-08-27 DIAGNOSIS — N1832 Chronic kidney disease, stage 3b: Secondary | ICD-10-CM

## 2021-08-27 DIAGNOSIS — N183 Chronic kidney disease, stage 3 unspecified: Secondary | ICD-10-CM

## 2021-08-27 DIAGNOSIS — K209 Esophagitis, unspecified without bleeding: Secondary | ICD-10-CM | POA: Diagnosis not present

## 2021-08-27 DIAGNOSIS — E118 Type 2 diabetes mellitus with unspecified complications: Secondary | ICD-10-CM | POA: Diagnosis not present

## 2021-08-27 DIAGNOSIS — R197 Diarrhea, unspecified: Secondary | ICD-10-CM | POA: Diagnosis not present

## 2021-08-27 DIAGNOSIS — N3941 Urge incontinence: Secondary | ICD-10-CM

## 2021-08-27 DIAGNOSIS — N189 Chronic kidney disease, unspecified: Secondary | ICD-10-CM | POA: Insufficient documentation

## 2021-08-27 DIAGNOSIS — I10 Essential (primary) hypertension: Secondary | ICD-10-CM

## 2021-08-27 DIAGNOSIS — R112 Nausea with vomiting, unspecified: Secondary | ICD-10-CM | POA: Diagnosis not present

## 2021-08-27 MED ORDER — MIRABEGRON ER 25 MG PO TB24: 25.0000 mg | ORAL_TABLET | Freq: Every day | ORAL | 3 refills | Status: DC

## 2021-08-27 NOTE — Assessment & Plan Note (Signed)
New in last 2 years. Repeat renal ultrasound (had on at [place] 2021) ?Referral to [facility]  ?Discussed with patient at length  ?Given labile BP question if component of renal artery stenosis from atherosclerotic disease--will defer arterial imaging to Nephrology pending their evaluation  ? ?

## 2021-08-27 NOTE — Assessment & Plan Note (Signed)
Number given to call GI.  ?

## 2021-08-27 NOTE — Patient Instructions (Addendum)
It was wonderful to see you today. ? ?Please bring ALL of your medications with you to every visit.  ? ?Today we talked about: ? ?Follow up in May or June   ? ?I will call you with lab work results ? ?I sent Myrbetriq for your bladder--take before bedtime ? ?CONGRATULATIONS on your blood pressure  ? ? ?Call [facility] for your [facility] Visit  ? ?Call the stomach doctors-- ?[phone] ? ?You will be call about the kidney doctor visit  ? ?Please call to schedule your ultrasound  ? ?Thank you for choosing [facility].  ? ?Please call [phone] with any questions about today's appointment. ? ?Please be sure to schedule follow up at the front  desk before you leave today.  ? ?Dorris Singh, MD  ?Family Medicine  ? ?

## 2021-08-27 NOTE — Assessment & Plan Note (Signed)
Will call pharmacy and clarify indapamide dose. BMP today ?Given BP lability and underlying CKD, referral to Nephrology for further evaluation and recommendations in management ?Could increase  Carvedilol-would likely not affect HR too much at 12.5 mg BID dose  ?She is going to check over next 2 weeks and send values on MyChart ? ?

## 2021-08-27 NOTE — Progress Notes (Signed)
? ? ?  SUBJECTIVE:  ? ?CHIEF COMPLAINT: BP follow up ?HPI:  ? ?Lauren Craig is a 76 y.o.  with history notable for type 2 diabetes (diet controlled), CKD IIIb, hypertension, and CVA  presenting for follow up for blood pressure. She is joing by her duaghter. Doing better---Birthday upcoming, [name] visits 1-2 times per week (Tues and Friday). Family reunion in July in [place]. Excited to meet new cousin (lives in [place]).  ? ?BP ?In regards to her hypertension, the patient reports compliance with medications  ?In reviewing records, she was on just an ACE for many years. Beginning ~ 1 year ago has escalating blood pressure values, multiple agents now added. Systolics have remained markedly elevated while diastolics are in 59-93T. Pulse ranges from 50-70 (no pacemaker). She has been taking indapamide 1.25 + 2.5 mg. Otherwise medications correct. No headaches or chest pain.  ?Current meds ?- Amlodipine 10 mg  ?- Indapamide 2.5 mg ?- Enalapril 20 mg  ?- Carvedilol 6.25 mg BID  ? ?Urinary incontinence ?The patient has urge incontinence usually at night. Occurs as soon as she gets up--cannot make it to bathroom. No dysuria, hematuria. Has had this longstanding since she had children. NO significant stress incontinence.  ? ?Diarrhea ?Continues to have on and off diarrhea. Today has nausea. Dropped off C. Difficile sample at GI. No vomiting, hematochezia, melena, fevers. Diarrhea resolved this week. She is uncertain of trigger. Has not yet scheduled follow up with GI.  ? ? ?PERTINENT  PMH / PSH/Family/Social History :  ?HTN ?CKD IIIb  ?Type 2 Diabetes Mellitus ? ?OBJECTIVE:  ? ?BP (!) 141/60   Pulse 62   Temp 97.7 ?F (36.5 ?C)   Ht 5\' 2"  (1.575 m)   Wt 165 lb 3.2 oz (74.9 kg)   SpO2 94%   BMI 30.22 kg/m?   ?Today's weight:  ?Last Weight  Most recent update: 08/27/2021 12:00 PM  ? ? Weight  ?74.9 kg (165 lb 3.2 oz)  ?      ? ?  ? ?Review of prior weights: ?Filed Weights  ? 08/27/21 1138  ?Weight: 165 lb 3.2 oz (74.9 kg)   ? ? ?Cardiac: Regular rate and rhythm. Normal S1/S2. No murmurs, rubs, or gallops appreciated. ?Lungs: Clear bilaterally to ascultation.  ?Abdomen: Normoactive bowel sounds. No tenderness to deep or light palpation. No rebound or guarding.    ?Psych: Pleasant and appropriate  ?No edema  ? ? ?ASSESSMENT/PLAN:  ? ?Esophagitis ?Number given to call GI.  ? ?HTN (hypertension) Difficult to Control with Isolated Systolic HTN  ?Will call pharmacy and clarify indapamide dose. BMP today ?Given BP lability and underlying CKD, referral to Nephrology for further evaluation and recommendations in management ?Could increase  Carvedilol-would likely not affect HR too much at 12.5 mg BID dose  ?She is going to check over next 2 weeks and send values on MyChart ? ? ?Chronic kidney disease (CKD), stage III (moderate) (HCC) ?New in last 2 years. Repeat renal ultrasound (had on at [facility] 2021) ?Referral to [facility]  ?Discussed with patient at length  ?Given labile BP question if component of renal artery stenosis from atherosclerotic disease--will defer arterial imaging to Nephrology pending their evaluation  ?  ?Urge Incontinence ?Cannot take anticholinergics due to side effects of dry mouth, falls, GI side effects  ?Trial Beta3 agonist with mirabegron  ? ?LDL at follow up  ? ? ?Dorris Singh, MD  ?Family Medicine Teaching Service  ?[place] [facility]  ? ? ?

## 2021-08-28 ENCOUNTER — Encounter: Payer: Self-pay | Admitting: Family Medicine

## 2021-08-28 ENCOUNTER — Telehealth: Payer: Self-pay | Admitting: Family Medicine

## 2021-08-28 LAB — BASIC METABOLIC PANEL
BUN/Creatinine Ratio: 19 (ref 12–28)
BUN: 35 mg/dL — ABNORMAL HIGH (ref 8–27)
CO2: 19 mmol/L — ABNORMAL LOW (ref 20–29)
Calcium: 9.1 mg/dL (ref 8.7–10.3)
Chloride: 109 mmol/L — ABNORMAL HIGH (ref 96–106)
Creatinine, Ser: 1.81 mg/dL — ABNORMAL HIGH (ref 0.57–1.00)
Glucose: 154 mg/dL — ABNORMAL HIGH (ref 70–99)
Potassium: 5.9 mmol/L — ABNORMAL HIGH (ref 3.5–5.2)
Sodium: 143 mmol/L (ref 134–144)
eGFR: 29 mL/min/{1.73_m2} — ABNORMAL LOW (ref >59)

## 2021-08-28 NOTE — Telephone Encounter (Signed)
Attempted to call patient about K of 5.9 with AKI (mild). Recommend evaluation in ED with repeat values. ?Left generic message to call [facility] Emergency Line for instructions.  Will try daughter and other numbers. ? ?Attempted to call daughter--VM full. ? ?Attempted to call patient on another line-unable to reach. ? ?Routing to inpatient team as I have sent a message that they should present to ED.  ?Dorris Singh, MD  ?Family Medicine Teaching Service  ? ?

## 2021-08-28 NOTE — Telephone Encounter (Signed)
Called and discussed need for repeat K, creatinine. STOP enalapril until further evaluation.  ?Teachback method used, they will proceed to ED ? ?Dorris Singh, MD  ?Family Medicine Teaching Service  ? ?

## 2021-08-30 ENCOUNTER — Other Ambulatory Visit: Payer: Self-pay

## 2021-08-30 LAB — CLOSTRIDIUM DIFFICILE BY PCR: Toxigenic C. Difficile by PCR: POSITIVE — AB

## 2021-08-30 MED ORDER — DIFICID 200 MG PO TABS
200.0000 mg | ORAL_TABLET | Freq: Two times a day (BID) | ORAL | 0 refills | Status: DC
Start: 2021-08-30 — End: 2021-08-31

## 2021-08-31 ENCOUNTER — Other Ambulatory Visit: Payer: Self-pay

## 2021-08-31 ENCOUNTER — Encounter (HOSPITAL_COMMUNITY): Payer: Self-pay

## 2021-08-31 ENCOUNTER — Telehealth: Payer: Self-pay | Admitting: Family Medicine

## 2021-08-31 ENCOUNTER — Emergency Department (HOSPITAL_COMMUNITY)
Admission: EM | Admit: 2021-08-31 | Discharge: 2021-09-01 | Disposition: A | Discharge status: Home / Self Care | Payer: PPO | Attending: Student | Admitting: Student

## 2021-08-31 ENCOUNTER — Other Ambulatory Visit (HOSPITAL_COMMUNITY): Payer: Self-pay

## 2021-08-31 ENCOUNTER — Telehealth: Payer: Self-pay

## 2021-08-31 DIAGNOSIS — Z5321 Procedure and treatment not carried out due to patient leaving prior to being seen by health care provider: Secondary | ICD-10-CM | POA: Insufficient documentation

## 2021-08-31 DIAGNOSIS — E875 Hyperkalemia: Secondary | ICD-10-CM | POA: Diagnosis not present

## 2021-08-31 DIAGNOSIS — R531 Weakness: Secondary | ICD-10-CM | POA: Diagnosis not present

## 2021-08-31 LAB — CBC
HCT: 37.9 % (ref 36.0–46.0)
Hemoglobin: 12.3 g/dL (ref 12.0–15.0)
MCH: 26.7 pg (ref 26.0–34.0)
MCHC: 32.5 g/dL (ref 30.0–36.0)
MCV: 82.4 fL (ref 80.0–100.0)
Platelets: 254 10*3/uL (ref 150–400)
RBC: 4.6 MIL/uL (ref 3.87–5.11)
RDW: 15.1 % (ref 11.5–15.5)
WBC: 7.3 10*3/uL (ref 4.0–10.5)
nRBC: 0 % (ref 0.0–0.2)

## 2021-08-31 LAB — BASIC METABOLIC PANEL
Anion gap: 8 (ref 5–15)
BUN: 24 mg/dL — ABNORMAL HIGH (ref 8–23)
CO2: 25 mmol/L (ref 22–32)
Calcium: 9 mg/dL (ref 8.9–10.3)
Chloride: 106 mmol/L (ref 98–111)
Creatinine, Ser: 1.71 mg/dL — ABNORMAL HIGH (ref 0.44–1.00)
GFR, Estimated: 31 mL/min — ABNORMAL LOW (ref >60)
Glucose, Bld: 129 mg/dL — ABNORMAL HIGH (ref 70–99)
Potassium: 4.5 mmol/L (ref 3.5–5.1)
Sodium: 139 mmol/L (ref 135–145)

## 2021-08-31 MED ORDER — VANCOMYCIN HCL 125 MG PO CAPS: 125.0000 mg | ORAL_CAPSULE | Freq: Four times a day (QID) | ORAL | 0 refills | Status: DC

## 2021-08-31 MED ORDER — DIFICID 200 MG PO TABS
200.0000 mg | ORAL_TABLET | Freq: Two times a day (BID) | ORAL | 0 refills | Status: DC
Start: 2021-08-31 — End: 2021-09-01

## 2021-08-31 NOTE — Telephone Encounter (Signed)
PA for Dificid submitted to patient's insurance.  ?

## 2021-08-31 NOTE — ED Triage Notes (Signed)
Pt reports her doctor sent her here today for elevated potassium, 5.9. She states she feels weak because she has not eaten much today.  ?

## 2021-08-31 NOTE — Telephone Encounter (Signed)
Spoke with the patient. ?Vancomycin 125 mg # 40 will cost her $80.00. She says she cannot afford that. ?PA for Dificid was done. [facility] has this in stock. The cost will be $50.00. ?The patient says she can afford this she thinks.  ?[facility] the [facility] and cancelled the Vancomycin. Dificid Rx to [facility]. ?

## 2021-08-31 NOTE — Telephone Encounter (Signed)
Schedule follow up appointment

## 2021-08-31 NOTE — Telephone Encounter (Signed)
Patient called requesting you call her back.  Phone number [phone].  Thank you. ?

## 2021-08-31 NOTE — Telephone Encounter (Signed)
Called patient to follow-up on MyChart message and discuss results.  She has not yet gotten her potassium or creatinine repeated.  Her C. difficile is positive and she is very worried this is going to "kill her".  Discussed that this was a treatable curable disease at length.  Given that she is having ongoing symptoms including ongoing nausea and vomiting since yesterday and electrolyte abnormalities at her visit Friday recommended immediate evaluation in the ER.  Her daughter is going to transport her to the nearest emergency room.  All questions answered.  Her daughter is to bring her to the emergency room for repeat labs and evaluation for hydration status and consideration of admission for treatment of her ongoing symptomatic C. difficile. ?

## 2021-09-01 ENCOUNTER — Telehealth: Payer: Self-pay | Admitting: Family Medicine

## 2021-09-01 ENCOUNTER — Other Ambulatory Visit: Payer: Self-pay

## 2021-09-01 ENCOUNTER — Other Ambulatory Visit (HOSPITAL_COMMUNITY): Payer: Self-pay

## 2021-09-01 MED ORDER — DIFICID 200 MG PO TABS
200.0000 mg | ORAL_TABLET | Freq: Two times a day (BID) | ORAL | 0 refills | Status: DC
  Filled 2021-09-01: qty 20, 10d supply, fill #0

## 2021-09-01 NOTE — Telephone Encounter (Signed)
I spoke with the daughter and the patient. I had sent the Dificid prescription to the wrong pharmacy. Called the [facility] and canceled the Dificid prescription. Transmitted the prescription to [facility] because they have the prescription in stock. ?

## 2021-09-01 NOTE — Telephone Encounter (Signed)
Already addressed this AM.  ? ?Dorris Singh, MD  ?Family Medicine Teaching Service  ? ?

## 2021-09-01 NOTE — Telephone Encounter (Signed)
Called with results from ED. Will plan to hold ACE until feeling improved. Ate breakfast this AM. All other medications appropriate with creatinine. Will check in next week, repeat labs 4/17.  ? ?Dorris Singh, MD  ?Family Medicine Teaching Service  ? ?

## 2021-09-01 NOTE — ED Notes (Signed)
Pt left due to long wait time.  ?

## 2021-09-01 NOTE — Telephone Encounter (Signed)
Patient daughter is called regarding medication. Per daughter, they are at the pharmacy but there is no medicine for her mother there. Please advise. ?

## 2021-09-01 NOTE — Telephone Encounter (Signed)
Daughter called this morning asking for Dr.Brown to fax lab orders to [facility], so patient can get her labs drawn there.  The fax number is [phone].  These will need to be ordered as external and the order requisition that prints can be faxed.  If the patient needs: ? ? An appointment - route response to [facility] admin ? A callback from clinic staff - route response to your team ? ? ?Only route to RN Team: form completions or if RN team directly requests response sent to them ? ? ?Orpheus Hayhurst, CMA ? ?

## 2021-09-06 ENCOUNTER — Telehealth: Payer: Self-pay | Admitting: Family Medicine

## 2021-09-06 NOTE — Telephone Encounter (Signed)
Called patient about BP. No readings at home this week. Will obtain and call with values on Friday. Plan to restart enalapril as able.  ? ?Dorris Singh, MD  ?Family Medicine Teaching Service  ? ?

## 2021-09-17 ENCOUNTER — Telehealth: Payer: Self-pay | Admitting: Family Medicine

## 2021-09-17 NOTE — Telephone Encounter (Signed)
Attempted to call patient.  Received MyChart message from her daughter that she is ill.  Unable to leave voicemail unable to reach patient.  Have asked nursing staff to reach out. ?

## 2021-09-22 ENCOUNTER — Telehealth: Payer: PPO

## 2021-09-22 ENCOUNTER — Telehealth: Payer: Self-pay

## 2021-09-22 NOTE — Telephone Encounter (Signed)
? ?  RN Case Manager ?Care Management  ? Phone Outreach  ? ? ?09/22/2021 ?Name: Lauren Craig MRN: [id number] DOB: Dec 20, 1945 ? ?Lauren Craig is a 76 y.o. year old female who is a primary care patient of Martyn Malay, MD .  ? ?Telephone outreach was unsuccessful Unable to leave a HIPPA compliant phone message due to voice mailbox full. ? ?Follow Up Plan: Will route chart to Care Guide to see if patient would like to reschedule phone appointment.   ? ?Review of patient status, including review of consultants reports, relevant laboratory and other test results, and collaboration with appropriate care team members and the patient's provider was performed as part of comprehensive patient evaluation and provision of care management services.   ? ?Lazaro Arms RN, BSN, [facility] ?Care Management Coordinator ?[place] ?Phone: [phone] Fax: [phone] ?  ? ? ? ? ? ? ? ? ? ? ? ?

## 2021-10-04 ENCOUNTER — Telehealth: Payer: Self-pay | Admitting: *Deleted

## 2021-10-04 NOTE — Chronic Care Management (AMB) (Signed)
  Care Coordination Note  10/04/2021 Name: Lauren Craig MRN: [id number] DOB: 02-Dec-1945  Lauren Craig is a 76 y.o. year old female who is a primary care patient of Martyn Malay, MD and is actively engaged with the care management team. I reached out to [facility] by phone today to assist with re-scheduling a follow up visit with the RN Case Manager  Follow up plan: Unsuccessful telephone outreach attempt made. A HIPAA compliant phone message was left for the patient providing contact information and requesting a return call.  The care management team will reach out to the patient again over the next 7 days.  If patient returns call to provider office, please advise to call [place] at [phone].  [place] Management  Direct Dial: [phone]

## 2021-10-11 NOTE — Chronic Care Management (AMB) (Signed)
  Care Coordination Note  10/11/2021 Name: Lauren Craig MRN: [id number] DOB: [birth year]  Lauren Craig is a 76 y.o. year old female who is a primary care patient of Martyn Malay, MD and is actively engaged with the care management team. I reached out to [facility] by phone today to assist with re-scheduling a follow up visit with the RN Case Manager  Follow up plan: Unsuccessful telephone outreach attempt made. A HIPAA compliant phone message was left for the patient providing contact information and requesting a return call.  If patient returns call to provider office, please advise to call [place] at [place] Management  Direct Dial: [phone]

## 2021-10-18 NOTE — Chronic Care Management (AMB) (Signed)
  Care Coordination Note  10/18/2021 Name: Lauren Craig MRN: [id number] DOB: 1945/09/23  Lauren Craig is a 76 y.o. year old female who is a primary care patient of Martyn Malay, MD and is actively engaged with the care management team. I reached out to [facility] by phone today to assist with re-scheduling a follow up visit with the RN Case Manager  Follow up plan: Unsuccessful telephone outreach attempt made. A HIPAA compliant phone message was left for the patient providing contact information and requesting a return call. Unable to make contact on outreach attempts x 3. PCP Dr Owens Shark notified via routed documentation in medical record. We have been unable to make contact with the patient for follow up. The care management team is available to follow up with the patient after provider conversation with the patient regarding recommendation for care management engagement and subsequent re-referral to the care management team.   [place] Management  Direct Dial: [phone]

## 2021-10-21 ENCOUNTER — Telehealth: Payer: Self-pay | Admitting: Family Medicine

## 2021-10-21 NOTE — Telephone Encounter (Signed)
Called patient about missing CCM calls. Gave patient number to [facility], Erline Levine. She will call. BP have been variable 130-160/70-80s. No falls, taking Enalapril. Recommended scheduling a visit for BMP.   Dorris Singh, MD  Family Medicine Teaching Service

## 2021-10-26 ENCOUNTER — Ambulatory Visit: Payer: PPO

## 2021-10-26 NOTE — Chronic Care Management (AMB) (Signed)
Chronic Care Management   CCM RN Visit Note  10/26/2021 Name: Lauren Craig MRN: [id number] DOB: 1945/08/11  Subjective: Lauren Craig is a 76 y.o. year old female who is a primary care patient of Martyn Malay, MD. The care management team was consulted for assistance with disease management and care coordination needs.    Engaged with patient by telephone for follow up visit in response to provider referral for case management and/or care coordination services.   Consent to Services:  The patient was given information about Chronic Care Management services, agreed to services, and gave verbal consent prior to initiation of services.  Please see initial visit note for detailed documentation.   Patient agreed to services and verbal consent obtained.    Summary:  The patient continues to make positive changes towards her HTN. She is complaining of lower back pain that is chronic and she said nothing helps to relieve it. . See Care Plan below for interventions and patient self-care actives.  Recommendation: The patient may benefit from taking medications as prescribed, checking blood pressures and record values, calling your physician if numbers are abnormal, and The patient agrees.  Follow up Plan: Patient would like continued follow-up.  CCM RNCM will outreach the patient within the next $ weeks.  Patient will call office if needed prior to next encounter   Assessment: Review of patient past medical history, allergies, medications, health status, including review of consultants reports, laboratory and other test data, was performed as part of comprehensive evaluation and provision of chronic care management services.   SDOH (Social Determinants of Health) assessments and interventions performed:  No  CCM Care Plan    Conditions to be addressed/monitored:HTN  Care Plan : RN Care Manager  Updates made by Lazaro Arms, RN since 10/26/2021 12:00 AM     Problem: Hypertension       Long-Range Goal: The patient wil monitor and maintain her BP within given range   Start Date: 02/10/2021  Expected End Date: 12/27/2021  Priority: High  Note:   Current Barriers:  Knowledge Deficits related to plan of care for management of HTN  Chronic Disease Management support and education needs related to HTN  RNCM Clinical Goal(s):  Patient will verbalize understanding of plan for management of HTN through collaboration with RN Care manager, provider, and care team.   Interventions: 1:1 collaboration with primary care provider regarding development and update of comprehensive plan of care as evidenced by provider attestation and co-signature Inter-disciplinary care team collaboration (see longitudinal plan of care) Evaluation of current treatment plan related to  self management and patient's adherence to plan as established by provider   Hypertension: (Status: New goal. Goal on track: NO.) Long term Last practice recorded BP readings:  BP Readings from Last 3 Encounters:  07/29/21 (!) 237/87  07/05/21 (!) 194/65  06/09/21 (!) 184/50  Most recent eGFR/CrCl:  Lab Results  Component Value Date   EGFR 33 (L) 06/07/2021    No components found for: CRCL  Evaluation of current treatment plan related to hypertension self management and patient's adherence to plan as established by provider;   Reviewed medications with patient and discussed importance of compliance;  Patient denies any chest pain shortness of breath Discussed plans with patient for ongoing care management follow up and provided patient with direct contact information for care management team; 10/26/21: [name] reported feeling well, following her medication regimen, and eating and sleeping adequately. Her blood pressure readings were taken on 09/27/21 (165/72),  09/28/21 (137/65), and 09/30/21 (180/80). She did not experience any chest pain, headaches, or flushing. However, she did mention experiencing lower back  pain at a 6/10, which was not relieved by any remedies. I suggested using ointments like Icy Hot or Bengay and a heating pad for relief. Additionally, she reported using a walker for mobility in and outside the home, as she tires when walking long distances.  Patient Goals/Self Care Activities: -Patient/Caregiver will self-administer medications as prescribed as evidenced by self-report/primary caregiver report ,  -Patient/Caregiver will attend all scheduled provider appointments as evidenced by clinician review of documented attendance to scheduled appointments and patient/caregiver report,  -Patient/Caregiver will call pharmacy for medication refills as evidenced by patient report and review of pharmacy fill history as appropriate,  -Patient/Caregiver will call provider office for new concerns or questions as evidenced by review of documented incoming telephone call notes and patient report, Patient/Caregiver verbalizes understanding of plan, -Patient/Caregiver will focus on medication adherence by taking all medications as prescribed -Calls provider office for new concerns, questions, or BP outside discussed parameters -Checks BP and records as discussed -Follows a low sodium diet/DASH diet     Lazaro Arms RN, BSN, [place] Management Coordinator [place]  Phone: [phone]

## 2021-10-26 NOTE — Patient Instructions (Signed)
Visit Information  Lauren Craig  it was nice speaking with you. Please call me directly [phone] if you have questions about the goals we discussed. Patient Goals/Self Care Activities: -Patient/Caregiver will self-administer medications as prescribed as evidenced by self-report/primary caregiver report ,  -Patient/Caregiver will attend all scheduled provider appointments as evidenced by clinician review of documented attendance to scheduled appointments and patient/caregiver report,  -Patient/Caregiver will call pharmacy for medication refills as evidenced by patient report and review of pharmacy fill history as appropriate,  -Patient/Caregiver will call provider office for new concerns or questions as evidenced by review of documented incoming telephone call notes and patient report, Patient/Caregiver verbalizes understanding of plan, -Patient/Caregiver will focus on medication adherence by taking all medications as prescribed -Calls provider office for new concerns, questions, or BP outside discussed parameters -Checks BP and records as discussed -Follows a low sodium diet/DASH diet     Patient verbalizes understanding of instructions and care plan provided today and agrees to view in [place]. Active MyChart status and patient understanding of how to access instructions and care plan via MyChart confirmed with patient.     Follow up Plan: Patient would like continued follow-up.  CCM RNCM will outreach the patient within the next 4 weeks.  Patient will call office if needed prior to next encounter  Lazaro Arms, RN  [phone]

## 2021-10-28 ENCOUNTER — Telehealth: Payer: Self-pay

## 2021-10-28 NOTE — Telephone Encounter (Signed)
   RN Case Manager Care Management   Phone Outreach    10/28/2021 Name: Onna Nodal MRN: [id number] DOB: June 01, 1945  Johnna Bollier is a 76 y.o. year old female who is a primary care patient of Martyn Malay, [place]  received a call today from Mrs. Consoli. She mentioned that she gets sick with vomiting or diarrhea every time she eats, which has been going on for about a month. It takes her around two days to recover. Her illness has been happening for about a month. I asked if there were any changes to her medication, but she replied no. She is taking her Pepcid and is no longer taking Zofran due to her insurance. She is unsure about her protonix. I reminded her that she has an appointment with Dr. Owens Shark next week on the 9th.  Follow Up Plan: CM RN will follow up with the patient at he next scheduled Interval.   Review of patient status, including review of consultants reports, relevant laboratory and other test results, and collaboration with appropriate care team members and the patient's provider was performed as part of comprehensive patient evaluation and provision of care management services.    Lazaro Arms RN, BSN, [facility] Care Management Coordinator [place] Phone: [phone] Fax: [phone]

## 2021-10-29 ENCOUNTER — Telehealth: Payer: Self-pay

## 2021-10-29 NOTE — Telephone Encounter (Signed)
   RN Case Manager Care Management   Phone Outreach    10/29/2021 Name: Lauren Craig MRN: [id number] DOB: 1946-01-09  Lauren Craig is a 76 y.o. year old female who is a primary care patient of Martyn Malay, MD .    The CM RM made two attempts to contact the patient and assist with scheduling an appointment with their GI physician. Unfortunately, only one message could be left as the patient's mailbox was full. The CM RM plans to wait for a return call.  Follow Up Plan: CM RN will wait for return call.   Review of patient status, including review of consultants reports, relevant laboratory and other test results, and collaboration with appropriate care team members and the patient's provider was performed as part of comprehensive patient evaluation and provision of care management services.    Lazaro Arms RN, BSN, [facility] Care Management Coordinator [place] Phone: [phone] Fax: [phone]

## 2021-11-02 ENCOUNTER — Encounter: Payer: Self-pay | Admitting: *Deleted

## 2021-11-05 ENCOUNTER — Encounter: Payer: Self-pay | Admitting: Family Medicine

## 2021-11-05 ENCOUNTER — Telehealth: Payer: Self-pay | Admitting: Family Medicine

## 2021-11-05 ENCOUNTER — Ambulatory Visit (INDEPENDENT_AMBULATORY_CARE_PROVIDER_SITE_OTHER): Payer: PPO | Admitting: Family Medicine

## 2021-11-05 VITALS — BP 158/58 | HR 62 | Wt 162.6 lb

## 2021-11-05 DIAGNOSIS — K209 Esophagitis, unspecified without bleeding: Secondary | ICD-10-CM | POA: Diagnosis not present

## 2021-11-05 DIAGNOSIS — N3941 Urge incontinence: Secondary | ICD-10-CM

## 2021-11-05 DIAGNOSIS — E7800 Pure hypercholesterolemia, unspecified: Secondary | ICD-10-CM | POA: Diagnosis not present

## 2021-11-05 DIAGNOSIS — E118 Type 2 diabetes mellitus with unspecified complications: Secondary | ICD-10-CM

## 2021-11-05 DIAGNOSIS — I10 Essential (primary) hypertension: Secondary | ICD-10-CM

## 2021-11-05 LAB — POCT GLYCOSYLATED HEMOGLOBIN (HGB A1C): HbA1c, POC (controlled diabetic range): 6.6 % (ref 0.0–7.0)

## 2021-11-05 MED ORDER — ONDANSETRON 4 MG PO TBDP: 4.0000 mg | ORAL_TABLET | Freq: Three times a day (TID) | ORAL | 0 refills | Status: DC | PRN

## 2021-11-05 MED ORDER — PANTOPRAZOLE SODIUM 40 MG PO TBEC: 40.0000 mg | DELAYED_RELEASE_TABLET | Freq: Every day | ORAL | 3 refills | Status: DC

## 2021-11-05 MED ORDER — ENALAPRIL MALEATE 20 MG PO TABS: 20.0000 mg | ORAL_TABLET | Freq: Every day | ORAL | 3 refills | Status: DC

## 2021-11-05 NOTE — Patient Instructions (Addendum)
It was wonderful to see you today.  Please bring ALL of your medications with you to every visit.   Today we talked about:  --Please call Gastroenterology today  [phone]  -Please call [facility] for a visit  [phone]  - I sent a refill for your stomach medicines  - I will call you about blood work  --Please log your blood pressures twice per week  - Follow up in 1 month      Thank you for choosing [facility].   Please call [phone] with any questions about today's appointment.  Please be sure to schedule follow up at the front  desk before you leave today.   Dorris Singh, MD  Family Medicine

## 2021-11-05 NOTE — Assessment & Plan Note (Signed)
Direct LDL today.  She had black coffee this morning.

## 2021-11-05 NOTE — Assessment & Plan Note (Signed)
Systolic in the office remains elevated at home she is at goal.  Given her low diastolic blood pressure and bradycardia unable to further titrate beta-blocker.  She is on maximal dose of enalapril and indapamide.  She has been referred to nephrology for blood pressure management.  Number given to patient for this.

## 2021-11-05 NOTE — Telephone Encounter (Signed)
Reviewed, completed, and signed form.  Note routed to RN team inbasket and placed completed form in RN Wall pocket in the front office. Please attach patient's medication list and list of diagnosis (can print problem list)  Martyn Malay, MD

## 2021-11-05 NOTE — Assessment & Plan Note (Signed)
A1c is at goal.  Continue current diet control regiment.  I am most concerned about her weight loss in the setting of her abdominal symptoms.

## 2021-11-05 NOTE — Progress Notes (Signed)
SUBJECTIVE:   CHIEF COMPLAINT: medications, stomach issues  HPI:   Lauren Craig is a 76 y.o.  with history notable for type 2 diabetes (diet controlled), difficult to control HTN due to hyperkalemia, CVA (multiple), and history of C. difficile presenting for follow up.  She reports she is overall feeling somewhat stressed.  She recently had a significant stressor of being threatened to foreclosed on her house.  She reports she is very anxious because of this.  The patient reports her stomach has been acting up. She has been vomiting and having diarrhea 1-5 times per week. Unclear trigger, she has tried to eat mostly bland foods but this is not resolved her symptoms.  She is out of her Protonix.  She is also out of her Zofran.  She has not not yet scheduled with gastroenterology.  No melena or hematemesis.  She has had a 3 pound weight loss since her last visit.  No syncopal episodes or dizziness upon standing.  She is able to keep down usually 1 meal a day but often has vomiting when he got to eat.  Her A1C today is 6.6.  Her diabetes is diet controlled.  In regards to her hypertension, she is logging her blood pressures at home.  Blood pressures at home range from 140s to 150s over 70s to 80s.  She is taking enalapril 20 mg, indapamide 2.5, carvedilol 6.25.  No headaches chest pains or dyspnea.  No lower extremity edema.  No syncopal episodes or falls.  She is regularly using her walker at home which she very much likes  PERTINENT  PMH / PSH/Family/Social History : updated and reviewed   OBJECTIVE:   BP (!) 158/58   Pulse 62   Wt 162 lb 9.6 oz (73.8 kg)   SpO2 100%   BMI 29.74 kg/m   Today's weight:  Last Weight  Most recent update: 11/05/2021  9:12 AM    Weight  73.8 kg (162 lb 9.6 oz)            Review of prior weights: [employer]   11/05/21 0911  Weight: 162 lb 9.6 oz (73.8 kg)     Cardiac: Regular rate and rhythm. Normal S1/S2. No murmurs, rubs, or gallops  appreciated. Lungs: Clear bilaterally to ascultation.  Abdomen: Normoactive bowel sounds. No tenderness to deep or light palpation. No rebound or guarding.  No masses.  No right upper quadrant pain Psych: Pleasant and appropriate    ASSESSMENT/PLAN:   HTN (hypertension) Difficult to Control with Isolated Systolic HTN  Systolic in the office remains elevated at home she is at goal.  Given her low diastolic blood pressure and bradycardia unable to further titrate beta-blocker.  She is on maximal dose of enalapril and indapamide.  She has been referred to nephrology for blood pressure management.  Number given to patient for this.  Hyperlipidemia Direct LDL today.  She had black coffee this morning.  Controlled diabetes mellitus type 2 with complications (HCC) H4V is at goal.  Continue current diet control regiment.  I am most concerned about her weight loss in the setting of her abdominal symptoms.   Recurrent nausea and vomiting, with history of esophagitis and C. difficile, concern for recurrence of esophagitis or possibly gastritis.  Refilled Protonix.  Reviewed return precautions with the patient.  I discussed that scheduling with gastroenterology has been of the utmost importance.  This been stressed several times.  Number given for them to call in the parking lot  prior to leaving today.  They are also connected with chronic care management to facilitate connecting with a specialist.  At follow-up consider discussion of COVID booster and zoster.  COVID booster is on hold currently.   Dorris Singh, [place]

## 2021-11-06 LAB — BASIC METABOLIC PANEL
BUN/Creatinine Ratio: 15 (ref 12–28)
BUN: 24 mg/dL (ref 8–27)
CO2: 20 mmol/L (ref 20–29)
Calcium: 9.1 mg/dL (ref 8.7–10.3)
Chloride: 106 mmol/L (ref 96–106)
Creatinine, Ser: 1.57 mg/dL — ABNORMAL HIGH (ref 0.57–1.00)
Glucose: 150 mg/dL — ABNORMAL HIGH (ref 70–99)
Potassium: 4.7 mmol/L (ref 3.5–5.2)
Sodium: 140 mmol/L (ref 134–144)
eGFR: 34 mL/min/{1.73_m2} — ABNORMAL LOW (ref >59)

## 2021-11-06 LAB — LDL CHOLESTEROL, DIRECT: LDL Direct: 164 mg/dL — ABNORMAL HIGH (ref 0–99)

## 2021-11-08 ENCOUNTER — Telehealth: Payer: Self-pay | Admitting: Family Medicine

## 2021-11-08 NOTE — Telephone Encounter (Signed)
Attempted to call patient. Reached voicemail, left generic voicemail to call back.   If calls back, will need to confirm patient is taking statin and ezetimibe.   Dorris Singh, MD  Family Medicine Teaching Service

## 2021-11-09 ENCOUNTER — Ambulatory Visit: Payer: PPO

## 2021-11-09 NOTE — Telephone Encounter (Signed)
Form placed up front for pick up with attached medication list and problem list.   Copy was made for batch scanning.   Mychart message sent to [place].   Will pick up tomorrow.

## 2021-11-10 ENCOUNTER — Encounter: Payer: Self-pay | Admitting: Family Medicine

## 2021-11-18 ENCOUNTER — Ambulatory Visit: Payer: Self-pay

## 2021-11-18 NOTE — Patient Instructions (Signed)
Visit Information  Lauren Craig  it was nice speaking with you. Please call me directly [phone] if you have questions about the goals we discussed.  Patient Goals/Self Care Activities: -Patient/Caregiver will self-administer medications as prescribed as evidenced by self-report/primary caregiver report ,  -Patient/Caregiver will attend all scheduled provider appointments as evidenced by clinician review of documented attendance to scheduled appointments and patient/caregiver report,  -Patient/Caregiver will call pharmacy for medication refills as evidenced by patient report and review of pharmacy fill history as appropriate,  -Patient/Caregiver will call provider office for new concerns or questions as evidenced by review of documented incoming telephone call notes and patient report, Patient/Caregiver verbalizes understanding of plan, -Patient/Caregiver will focus on medication adherence by taking all medications as prescribed -Calls provider office for new concerns, questions, or BP outside discussed parameters -Checks BP and records as discussed -Follows a low sodium diet/DASH diet   Patient verbalizes understanding of instructions and care plan provided today and agrees to view in [place]. Active MyChart status and patient understanding of how to access instructions and care plan via MyChart confirmed with patient.     Follow up Plan: Patient would like continued follow-up.  CM RNCM will outreach the patient within the next 1 day  Patient will call office if needed prior to next encounter  Lazaro Arms, RN  [phone]

## 2021-11-18 NOTE — Chronic Care Management (AMB) (Signed)
Chronic Care Management   CCM RN Visit Note  11/18/2021 Name: Lauren Craig MRN: [id number] DOB: 04/05/1946  Subjective: Lauren Craig is a 76 y.o. year old female who is a primary care patient of Martyn Malay, MD. The care management team was consulted for assistance with disease management and care coordination needs.    Engaged with patient by telephone for follow up visit in response to provider referral for case management and/or care coordination services.   Consent to Services:  The patient was given information about Chronic Care Management services, agreed to services, and gave verbal consent prior to initiation of services.  Please see initial visit note for detailed documentation.   Patient agreed to services and verbal consent obtained.    Summary: The patient's blood pressure reading was 186/84. Although she claimed to be taking all her prescribed medications, she felt like she may have missed one of her blood pressure medications. During the conversation, she also mentioned her stomach issues, which her daughter had supposedly scheduled an appointment for. However, there was no record of it in the system. I suggested calling the doctor's office to confirm the appointment. . See Care Plan below for interventions and patient self-care actives.  Recommendation: The patient may benefit from taking medications as prescribed, monitoring food intake, checking blood pressures and record values, calling your physician if numbers are abnormal, and The patient agrees.  Follow up Plan: Patient would like continued follow-up.  CM RNCM will outreach the patient within the next 1 day  Patient will call office if needed prior to next encounter    Assessment: Review of patient past medical history, allergies, medications, health status, including review of consultants reports, laboratory and other test data, was performed as part of comprehensive evaluation and provision of chronic care  management services.   SDOH (Social Determinants of Health) assessments and interventions performed:  No CCM Care Plan  Conditions to be addressed/monitored:HTN  Care Plan : RN Care Manager  Updates made by Lazaro Arms, RN since 11/18/2021 12:00 AM     Problem: Hypertension      Long-Range Goal: The patient wil monitor and maintain her BP within given range   Start Date: 02/10/2021  Expected End Date: 12/27/2021  Priority: High  Note:   Current Barriers:  Knowledge Deficits related to plan of care for management of HTN  Chronic Disease Management support and education needs related to HTN  RNCM Clinical Goal(s):  Patient will verbalize understanding of plan for management of HTN through collaboration with RN Care manager, provider, and care team.   Interventions: 1:1 collaboration with primary care provider regarding development and update of comprehensive plan of care as evidenced by provider attestation and co-signature Inter-disciplinary care team collaboration (see longitudinal plan of care) Evaluation of current treatment plan related to  self management and patient's adherence to plan as established by provider   Hypertension: (Status: New goal. Goal on track: NO.) Long term Last practice recorded BP readings:  BP Readings from Last 3 Encounters:  11/05/21 (!) 158/58  09/01/21 (!) 176/73  08/27/21 (!) 141/60  Most recent eGFR/CrCl:  Lab Results  Component Value Date   EGFR 34 (L) 11/05/2021    No components found for: "CRCL"  Evaluation of current treatment plan related to hypertension self management and patient's adherence to plan as established by provider;   Reviewed medications with patient and discussed importance of compliance;  Patient denies any chest pain shortness of breath Discussed plans with patient for  ongoing care management follow up and provided patient with direct contact information for care management team; 11/18/21  The patient's blood pressure  reading was 186/84. Although she claimed to be taking all her prescribed medications, she felt like she may have missed one of her blood pressure medications. After reviewing her medication list, which included amlodipine 56m once daily, enalapril 2281monce at bedtime, and indapamide 2.81m781mnce daily, she was unsure which one she missed. I advised her to call the pharmacy for a refill on all her medications. She promised to do so. During the conversation, she also mentioned her stomach issues, which her daughter had supposedly scheduled an appointment for. However, there was no record of it in the system. I suggested calling the doctor's office to confirm the appointment. We called the office together, but there was a long wait time. I left a message with her contact information so they could schedule an appointment for her. I also promised to follow up with her the next day to ensure she was contacted. She expressed her gratitude for my help.  Patient Goals/Self Care Activities: -Patient/Caregiver will self-administer medications as prescribed as evidenced by self-report/primary caregiver report ,  -Patient/Caregiver will attend all scheduled provider appointments as evidenced by clinician review of documented attendance to scheduled appointments and patient/caregiver report,  -Patient/Caregiver will call pharmacy for medication refills as evidenced by patient report and review of pharmacy fill history as appropriate,  -Patient/Caregiver will call provider office for new concerns or questions as evidenced by review of documented incoming telephone call notes and patient report, Patient/Caregiver verbalizes understanding of plan, -Patient/Caregiver will focus on medication adherence by taking all medications as prescribed -Calls provider office for new concerns, questions, or BP outside discussed parameters -Checks BP and records as discussed -Follows a low sodium diet/DASH diet    TraLazaro Arms,  BSN, CPCFultonnagement Coordinator [place]: [phone]

## 2021-11-23 ENCOUNTER — Ambulatory Visit: Payer: Self-pay

## 2021-11-23 ENCOUNTER — Telehealth: Payer: Self-pay | Admitting: *Deleted

## 2021-11-23 NOTE — Chronic Care Management (AMB) (Signed)
  Care Coordination Note  11/23/2021 Name: [name] MRN: [id number] DOB: 04-08-1946  Lauren Craig is a 76 y.o. year old female who is a primary care patient of Martyn Malay, MD and is actively engaged with the care management team. I reached out to [facility] by phone today to assist with re-scheduling a follow up visit with the RN Case Manager  Follow up plan: Unsuccessful telephone outreach attempt made. A HIPAA compliant phone message was left for the patient providing contact information and requesting a return call.  The care management team will reach out to the patient again over the next 7 days.  If patient returns call to provider office, please advise to call [place] at [phone].  [place] Management  Direct Dial: [phone]

## 2021-11-24 NOTE — Chronic Care Management (AMB) (Unsigned)
  Care Coordination Note  11/24/2021 Name: [name] MRN: [id number] DOB: 10/17/1945  Lauren Craig is a 76 y.o. year old female who is a primary care patient of Martyn Malay, MD and is actively engaged with the care management team. I reached out to [facility] by phone today to assist with re-scheduling a follow up visit with the RN Case Manager  Follow up plan: Unsuccessful telephone outreach attempt made. A HIPAA compliant phone message was left for the patient providing contact information and requesting a return call.  The care management team will reach out to the patient again over the next 5 days.  If patient returns call to provider office, please advise to call [place] at [phone].  [place] Management  Direct Dial: [phone]

## 2021-11-25 ENCOUNTER — Encounter: Payer: Self-pay | Admitting: *Deleted

## 2021-11-25 NOTE — Chronic Care Management (AMB) (Signed)
  Care Coordination Note  11/25/2021 Name: Lauren Craig MRN: [id number] DOB: 30-Jan-1946  Lauren Craig is a 76 y.o. year old female who is a primary care patient of Martyn Malay, MD and is actively engaged with the care management team. I reached out to Cathleen Fears by phone today to assist with re-scheduling a follow up visit with the RN Case Manager  Follow up plan: Telephone appointment with care management team member scheduled for:12/01/21  [place], [place] Management  Direct Dial: [phone]

## 2021-11-25 NOTE — Progress Notes (Signed)
Opened in error

## 2021-11-26 ENCOUNTER — Telehealth: Payer: PPO

## 2021-12-01 ENCOUNTER — Ambulatory Visit: Payer: PPO

## 2021-12-02 NOTE — Chronic Care Management (AMB) (Signed)
Chronic Care Management   CCM RN Visit Note  12/01/2021 Name: Lauren Craig MRN: [id number] DOB: 1945-12-21  Subjective: Lauren Craig is a 76 y.o. year old female who is a primary care patient of Lauren Malay, MD. The care management team was consulted for assistance with disease management and care coordination needs.    Engaged with patient by telephone for follow up visit in response to provider referral for case management and/or care coordination services.   Consent to Services:  The patient was given information about Chronic Care Management services, agreed to services, and gave verbal consent prior to initiation of services.  Please see initial visit note for detailed documentation.   Patient agreed to services and verbal consent obtained.    Summary: Patient is making progress with blood pressure and checking , but still is experiencing difficulty with elevated blood pressures. See Care Plan below for interventions and patient self-care actives.  Recommendation: The patient may benefit from taking medications as prescribed, exercising as tolerated, monitoring food intake, watch fluid intake, checking blood pressures and record values, calling your physician if numbers are abnormal, and The patient agrees.  Follow up Plan: Patient would like continued follow-up.  CM RNCM will outreach the patient within the next 4 weeks.  Patient will call office if needed prior to next encounter4weeks   Assessment: Review of patient past medical history, allergies, medications, health status, including review of consultants reports, laboratory and other test data, was performed as part of comprehensive evaluation and provision of chronic care management services.   SDOH (Social Determinants of Health) assessments and interventions performed:  no   Conditions to be addressed/monitored:HTN  Care Plan : RN Care Manager  Updates made by Lauren Arms, RN since 12/02/2021 12:00 AM      Problem: Hypertension      Long-Range Goal: The patient wil monitor and maintain her BP within given range   Start Date: 02/10/2021  Expected End Date: 02/25/2022  Priority: High  Note:   Current Barriers:  Knowledge Deficits related to plan of care for management of HTN  Chronic Disease Management support and education needs related to HTN  RNCM Clinical Goal(s):  Patient will verbalize understanding of plan for management of HTN through collaboration with RN Care manager, provider, and care team.   Interventions: 1:1 collaboration with primary care provider regarding development and update of comprehensive plan of care as evidenced by provider attestation and co-signature Inter-disciplinary care team collaboration (see longitudinal plan of care) Evaluation of current treatment plan related to  self management and patient's adherence to plan as established by provider   Hypertension: (Status: New goal. Goal on track: NO.) Long term Last practice recorded BP readings:  BP Readings from Last 3 Encounters:  11/05/21 (!) 158/58  09/01/21 (!) 176/73  08/27/21 (!) 141/60  Most recent eGFR/CrCl:  Lab Results  Component Value Date   EGFR 34 (L) 11/05/2021    No components found for: "CRCL"  Evaluation of current treatment plan related to hypertension self management and patient's adherence to plan as established by provider;   Reviewed medications with patient and discussed importance of compliance;  Patient denies any chest pain shortness of breath Discussed plans with patient for ongoing care management follow up and provided patient with direct contact information for care management team; 12/01/21:  Today, I talked with [name], who shared that she feels all right. Her blood pressure has been consistently at 186/80, and she has not experienced any chest pain  or flushing. Although she has been experiencing headaches, she believes they are unrelated to her blood pressure and may  be caused by her allergies. I commended her for regularly checking her blood pressure and taking her medications as prescribed. She also mentioned keeping track of her diet and getting enough rest. Regarding physical activity, she mentioned that doing household chores keeps her active, and she intends to attend all her upcoming appointments.  Patient Goals/Self Care Activities: -Patient/Caregiver will self-administer medications as prescribed as evidenced by self-report/primary caregiver report ,  -Patient/Caregiver will attend all scheduled provider appointments as evidenced by clinician review of documented attendance to scheduled appointments and patient/caregiver report,  -Patient/Caregiver will call pharmacy for medication refills as evidenced by patient report and review of pharmacy fill history as appropriate,  -Patient/Caregiver will call provider office for new concerns or questions as evidenced by review of documented incoming telephone call notes and patient report, Patient/Caregiver verbalizes understanding of plan, -Patient/Caregiver will focus on medication adherence by taking all medications as prescribed -Calls provider office for new concerns, questions, or BP outside discussed parameters -Checks BP and records as discussed -Follows a low sodium diet/DASH diet     Lauren Arms RN, BSN, [place] Management Coordinator [place]  Phone: [phone]

## 2021-12-02 NOTE — Patient Instructions (Signed)
Visit Information  Lauren Craig  it was nice speaking with you. Please call me directly [phone] if you have questions about the goals we discussed.  Patient verbalizes understanding of instructions and care plan provided today and agrees to view in [place]. Active MyChart status and patient understanding of how to access instructions and care plan via MyChart confirmed with patient.     Follow up Plan: Patient would like continued follow-up.  CM RNCM will outreach the patient within the next 4 weeks.  Patient will call office if needed prior to next encounter  Lazaro Arms, RN  [phone]

## 2021-12-17 ENCOUNTER — Other Ambulatory Visit: Payer: Self-pay

## 2021-12-17 ENCOUNTER — Ambulatory Visit (INDEPENDENT_AMBULATORY_CARE_PROVIDER_SITE_OTHER): Payer: PPO | Admitting: Family Medicine

## 2021-12-17 ENCOUNTER — Telehealth: Payer: Self-pay | Admitting: *Deleted

## 2021-12-17 ENCOUNTER — Encounter: Payer: Self-pay | Admitting: Family Medicine

## 2021-12-17 VITALS — BP 224/78 | HR 64 | Wt 163.4 lb

## 2021-12-17 DIAGNOSIS — I10 Essential (primary) hypertension: Secondary | ICD-10-CM | POA: Diagnosis not present

## 2021-12-17 DIAGNOSIS — R413 Other amnesia: Secondary | ICD-10-CM

## 2021-12-17 MED ORDER — FAMOTIDINE 10 MG PO TABS: 10.0000 mg | ORAL_TABLET | Freq: Every day | ORAL | 3 refills | Status: DC

## 2021-12-17 NOTE — Progress Notes (Signed)
SUBJECTIVE:   CHIEF COMPLAINT: follow up  HPI:   Lauren Craig is a 76 y.o.  with history notable for CVA, HTN, and well controlled type 2 diabetes presenting for follow up.  Memory concerns Her daughter has concerns about her today. Reports family travelled to [place] for family reunion and mother was confused in hotel room. Has also recently been confusing dates and times. The patient herself reports no concerns with her memory. She denies any difficulty remember where she has been or times or medications. She is agreeable to memory testing today.   HTN Patient reports she forgot to take any of her BP medications this AM. Reviewed all medications (has them with her). No headaches, chest pain, or dyspnea. She has a cuff at home.  Nausea/Vomiting Patient has a history of severe esophagitis and duodenitis. She has been due to see GI again but keeps forgetting to call. She thinks she has finally found the trigger of her symptoms. When she eats large meals or eats quickly, she vomits. Usually happens when she eats out. If she gets take out and eats at home, she is just fine. No melena or hematochezia. Weight is stable. Appetite has been better.   Home/Falls Denies falls at home. She is using rollator regularly. Her home is 2 stories but she can live on 1. Niece (heaven) will be moving in with her in August. They are still awaiting response from [employer].   Patient is worried about bruising on arms. No recent falls. No mucosal bleeding. She is on plavix.   PERTINENT  PMH / PSH/Family/Social History : reviewed, updated   OBJECTIVE:   BP (!) 224/78   Pulse 64   Wt 163 lb 6.4 oz (74.1 kg)   SpO2 100%   BMI 29.89 kg/m   Today's weight:  Last Weight  Most recent update: 12/17/2021 11:13 AM    Weight  74.1 kg (163 lb 6.4 oz)            Review of prior weights: Filed Weights   12/17/21 1112  Weight: 163 lb 6.4 oz (74.1 kg)    Cardiac: Regular rate and rhythm. Normal S1/S2.  No murmurs, rubs, or gallops appreciated. Lungs: Clear bilaterally to ascultation.  Abdomen: Normoactive bowel sounds. No tenderness to deep or light palpation. No rebound or guarding.  No masses  Psych: Pleasant and appropriate  Ext + on extensor surfaces purpura   Alert and oriented to person place and date  Completed portion of MOCA--patient became frustrated during serial substractions  Missed all but 1 serial subtraction, missed cube copy, and able to repeat only 3/5 words for memory testing    ASSESSMENT/PLAN:   HTN (hypertension) Difficult to Control with Isolated Systolic HTN  Markedly elevated today without signs of end organ damage.  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

## 2021-12-17 NOTE — Patient Instructions (Addendum)
It was wonderful to see you today.  Please bring ALL of your medications with you to every visit.   Today we talked about:  -Going to see our Geriatrician- Dr. Wendy Poet You will be called about a visit    Please take your blood pressure pills today  I will call you with lab results  Please measure your blood pressure later today and call me with the results    Please follow up in 3 months   Thank you for choosing [facility].   Please call [phone] with any questions about today's appointment.  Please be sure to schedule follow up at the front  desk before you leave today.   Dorris Singh, MD  Family Medicine

## 2021-12-17 NOTE — Assessment & Plan Note (Signed)
Markedly elevated today without signs of end organ damage.  Current regimen is indapamide, carvedilol, enalapril, amlodipine Diastolic value and K limit ability to add agents She will repeat BP at home today and daughter will message me Encouraged compliance with medications

## 2021-12-17 NOTE — Chronic Care Management (AMB) (Signed)
  Care Coordination Note  12/17/2021 Name: Lauren Craig MRN: [id number] DOB: 1945-12-30  Lauren Craig is a 76 y.o. year old female who is a primary care patient of Martyn Malay, MD and is actively engaged with the care management team. I reached out to [facility] by phone today to assist with re-scheduling a follow up visit with the RN Case Manager  Follow up plan: Unsuccessful telephone outreach attempt made. A HIPAA compliant phone message was left for the patient providing contact information and requesting a return call.  The care management team will reach out to the patient again over the next 7 days.  If patient returns call to provider office, please advise to call [place] at [place]: [phone]

## 2021-12-18 LAB — TSH: TSH: 2.5 u[IU]/mL (ref 0.450–4.500)

## 2021-12-18 LAB — CBC
Hematocrit: 36.1 % (ref 34.0–46.6)
Hemoglobin: 12.1 g/dL (ref 11.1–15.9)
MCH: 26.7 pg (ref 26.6–33.0)
MCHC: 33.5 g/dL (ref 31.5–35.7)
MCV: 80 fL (ref 79–97)
Platelets: 288 10*3/uL (ref 150–450)
RBC: 4.54 x10E6/uL (ref 3.77–5.28)
RDW: 14.9 % (ref 11.7–15.4)
WBC: 7.1 10*3/uL (ref 3.4–10.8)

## 2021-12-18 LAB — VITAMIN B12: Vitamin B-12: 312 pg/mL (ref 232–1245)

## 2021-12-20 ENCOUNTER — Encounter: Payer: Self-pay | Admitting: Family Medicine

## 2021-12-21 ENCOUNTER — Encounter: Payer: Self-pay | Admitting: *Deleted

## 2021-12-21 ENCOUNTER — Ambulatory Visit: Payer: PPO | Admitting: Internal Medicine

## 2021-12-22 ENCOUNTER — Other Ambulatory Visit: Payer: Self-pay

## 2021-12-22 DIAGNOSIS — K209 Esophagitis, unspecified without bleeding: Secondary | ICD-10-CM

## 2021-12-22 MED ORDER — ONDANSETRON 4 MG PO TBDP: 4.0000 mg | ORAL_TABLET | Freq: Three times a day (TID) | ORAL | 0 refills | Status: DC | PRN

## 2022-01-03 ENCOUNTER — Telehealth: Payer: PPO

## 2022-01-10 NOTE — Chronic Care Management (AMB) (Unsigned)
  Care Coordination Note  01/10/2022 Name: [name] MRN: [id number] DOB: 1946-03-31  Lauren Craig is a 76 y.o. year old female who is a primary care patient of Martyn Malay, MD and is actively engaged with the care management team. I reached out to [facility] by phone today to assist with re-scheduling a follow up visit with the RN Case Manager and initial call with BSW  Follow up plan: Unsuccessful telephone outreach attempt made. A HIPAA compliant phone message was left for the patient providing contact information and requesting a return call.  The care management team will reach out to the patient again over the next 7 days.  If patient returns call to provider office, please advise to call [place] at [phone].  [place]  Direct Dial: [phone]

## 2022-01-13 NOTE — Chronic Care Management (AMB) (Signed)
  Care Coordination Note  01/13/2022 Name: [name] MRN: [id number] DOB: 08-Oct-1945  Lauren Craig is a 76 y.o. year old female who is a primary care patient of Martyn Malay, MD and is actively engaged with the care management team. I reached out to [facility] by phone today to assist with re-scheduling a follow up visit with the RN Case Manager and BSW  Follow up plan: Unsuccessful telephone outreach attempt made. A HIPAA compliant phone message was left for the patient providing contact information and requesting a return call.  Unable to make contact on outreach attempts x 2. PCP Dr Owens Shark  notified via routed documentation in medical record.  We have been unable to make contact with the patient for follow up. The care management team is available to follow up with the patient after provider conversation with the patient regarding recommendation for care management engagement and subsequent re-referral to the care management team.   [place]  Direct Dial: [phone]

## 2022-01-20 ENCOUNTER — Encounter: Payer: Self-pay | Admitting: Family Medicine

## 2022-01-20 DIAGNOSIS — K209 Esophagitis, unspecified without bleeding: Secondary | ICD-10-CM

## 2022-01-20 MED ORDER — ONDANSETRON 4 MG PO TBDP: 4.0000 mg | ORAL_TABLET | Freq: Three times a day (TID) | ORAL | 0 refills | Status: DC | PRN

## 2022-02-22 ENCOUNTER — Ambulatory Visit (INDEPENDENT_AMBULATORY_CARE_PROVIDER_SITE_OTHER): Payer: PPO | Admitting: Physician Assistant

## 2022-02-22 ENCOUNTER — Encounter: Payer: Self-pay | Admitting: Physician Assistant

## 2022-02-22 VITALS — BP 150/76 | HR 74 | Ht 65.0 in | Wt 156.0 lb

## 2022-02-22 DIAGNOSIS — R197 Diarrhea, unspecified: Secondary | ICD-10-CM | POA: Diagnosis not present

## 2022-02-22 DIAGNOSIS — R1013 Epigastric pain: Secondary | ICD-10-CM | POA: Diagnosis not present

## 2022-02-22 DIAGNOSIS — R112 Nausea with vomiting, unspecified: Secondary | ICD-10-CM

## 2022-02-22 NOTE — Progress Notes (Signed)
Noted  

## 2022-02-22 NOTE — Patient Instructions (Signed)
_______________________________________________________  If you are age 77 or older, your body mass index should be between 23-30. Your Body mass index is 25.96 kg/m. If this is out of the aforementioned range listed, please consider follow up with your Primary Care Provider.  If you are age 24 or younger, your body mass index should be between 19-25. Your Body mass index is 25.96 kg/m. If this is out of the aformentioned range listed, please consider follow up with your Primary Care Provider.   ________________________________________________________  The  GI providers would like to encourage you to use [facility] to communicate with providers for non-urgent requests or questions.  Due to long hold times on the telephone, sending your provider a message by [facility] may be a faster and more efficient way to get a response.  Please allow 48 business hours for a response.  Please remember that this is for non-urgent requests.  _______________________________________________________  Lauren Craig have been scheduled for a gastric emptying scan at [facility] on 03-11-2022 at 7:30am. Please arrive 30 minutes prior to your appointment for registration. Please make certain not to have anything to eat or drink after midnight the night before your test. Hold all stomach medications (ex: Zofran, phenergan, Reglan) 48 hours prior to your test. If you need to reschedule your appointment, please contact radiology scheduling at [phone]. _____________________________________________________________________ A gastric-emptying study measures how long it takes for food to move through your stomach. There are several ways to measure stomach emptying. In the most common test, you eat food that contains a small amount of radioactive material. A scanner that detects the movement of the radioactive material is placed over your abdomen to monitor the rate at which food leaves your stomach. This test normally takes  about 4 hours to complete.  Follow up with Lauren Newer, PA on 04-05-2022 @ 11am  It was a pleasure to see you today!  Thank you for trusting me with your gastrointestinal care!     _____________________________________________________________________

## 2022-02-22 NOTE — Progress Notes (Signed)
Chief Complaint: Vomiting, diarrhea and abdominal pain  HPI:    Lauren Craig is a 76 year old female, known to Dr. Henrene Pastor, with a past medical history as listed below including PVD, stroke, type 2 diabetes and multiple others, who presents to clinic today with a complaint of vomiting, diarrhea and abdominal pain.    06/01/2021 patient seen in clinic by Tye Savoy for recurrent intermittent nausea, vomiting and diarrhea.  She was evaluated in the hospital for the symptoms in June 2022 and found to be indeterminate for C. difficile infection and diarrhea resolved with a course of Vancomycin.  EGD was remarkable for severe esophagitis probably related to vomiting and severe duodenitis.  Symptoms resolved with better glucose control, PPI and Carafate.  At that time repeated C. difficile PCR, recommended diagnostic colonoscopy off Plavix if C. difficile negative.  Discussed repeat EGD due to recurrent nausea/persistent anemia/severe esophagitis and duodenitis.  Discussed a normocytic anemia on Plavix with no overt GI bleeding.  She was first noted to be anemic in early June and her hemoglobin had remained low stable around 11 since then.    08/28/2018 3 repeat C. difficile PCR positive.    08/31/21 patient started on Dificid for repeat C. difficile.    12/17/2021 patient seen by family medicine.  At that time discussed nausea and vomiting.  She found the trigger for her symptoms was if she ate large meals or ate quickly.    Today, patient presents to clinic accompanied by her daughter who assists with history.  She tells me she typically has 2 to 3 days out of the week where she just cannot eat and feels nauseous and often has vomiting or will have diarrhea or both.  Also develops an epigastric pain "pain at the bottom of my stomach".  Her daughter tells me that if they try to go out to eat within 20 minutes after eating anything of substance she was in the bathroom with either vomiting or diarrhea.  Patient does  tell me that she tries to avoid eating really big meals or very fast because she knows this will trigger her symptoms.  If she eats little meals throughout the day then she does okay.  On her good days she has a soft solid stool and no pain.    Describes a weight loss of 50 pounds over the past year, but she was actively trying to do so.  Tells me she no longer is classified as a "diabetic".    Denies fever, chills, blood in her stool or symptoms that awaken her from sleep.  GI history: 10/29/20 EGD 1, Severe reflux esophagitis 2. Severe duodenitis. Status post biopsies 3. Otherwise unremarkable EGD   FINAL MICROSCOPIC DIAGNOSIS:   A. STOMACH, BIOPSY:  - Chronic gastritis.  - Warthin-Starry is negative for Helicobacter pylori.  - No intestinal metaplasia, dysplasia, or malignancy.      10/28/20 CTAP wo contrast IMPRESSION: 1. The tip of the appendix is mildly dilated with subtle periappendiceal fat stranding. Findings are concerning for earlyacute uncomplicated tip appendicitis. 2. Subtle fat stranding in the region of the duodenal bulb, which may represent duodenitis or peptic ulcer disease. 3. Circumferential thickening of the distal esophagus, suggesting esophagitis. Correlation with nonemergent endoscopy is recommended. 4. Small sliding type hiatal hernia. 5. Extensive colonic diverticulosis without evidence of acute diverticulitis. 6. Aortic atherosclerosis (ICD10-I70.0).  Past Medical History:  Diagnosis Date   Anxiety disorder    Arthritis    hands, hips   C. 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   Vitamin B12 deficiency     Past Surgical History:  Procedure Laterality Date   ABDOMINAL AORTOGRAM W/LOWER EXTREMITY N/A  08/11/2017   Procedure: ABDOMINAL AORTOGRAM W/LOWER EXTREMITY;  Surgeon: Angelia Mould, MD;  Location: [place] [facility];  Service: Cardiovascular;  Laterality: N/A;   ABDOMINAL HYSTERECTOMY     AMPUTATION Left 11/14/2019   Procedure: Left Fifth ray amputation;  Surgeon: Wylene Simmer, MD;  Location: [place];  Service: Orthopedics;  Laterality: Left;   AMPUTATION TOE Left 05/17/2018   Procedure: 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;  Surgeon: Wylene Simmer, MD;  Location: [place];  Service: Orthopedics;  Laterality: Left;   BIOPSY  10/29/2020   Procedure: BIOPSY;  Surgeon: Irene Shipper, MD;  Location: [facility];  Service: Endoscopy;;   ESOPHAGOGASTRODUODENOSCOPY (EGD) WITH PROPOFOL N/A 10/29/2020   Procedure: ESOPHAGOGASTRODUODENOSCOPY (EGD) WITH PROPOFOL;  Surgeon: Irene Shipper, MD;  Location[facility];  Service: Endoscopy;  Laterality: N/A;   PARTIAL HYMENECTOMY  1968   TONSILLECTOMY AND ADENOIDECTOMY     VAGINAL HYSTERECTOMY  1987    Current Outpatient Medications  Medication Sig Dispense Refill   acetaminophen (TYLENOL) 325 MG tablet Take 2 tablets (650 mg total) by mouth every 6 (six) hours as needed for headache.     albuterol (VENTOLIN HFA) 108 (90 Base) MCG/ACT inhaler Inhale 2 puffs into the lungs every 6 (six) hours as needed for wheezing or shortness of breath. 18 g 3   amLODipine (NORVASC) 10 MG tablet Take 1 tablet (10 mg total) by mouth daily. 90 tablet 3   atorvastatin (LIPITOR) 80 MG tablet Take 1 tablet (80 mg total) by mouth at bedtime. 90 tablet 3   carvedilol (COREG) 6.25 MG tablet Take 6.25 mg by mouth daily.     clopidogrel (PLAVIX) 75 MG tablet Take 75 mg by mouth daily.     enalapril (VASOTEC) 20 MG tablet Take 1 tablet (20 mg total) by mouth at bedtime. 90 tablet 3   ezetimibe (ZETIA) 10 MG tablet Take 1 tablet (10 mg total) by mouth daily. 90 tablet 3   famotidine (PEPCID) 10 MG tablet Take 1 tablet (10 mg total) by mouth daily. 90  tablet 3   fidaxomicin (DIFICID) 200 MG TABS tablet Take 1 tablet by mouth 2 times daily. 20 tablet 0   fluticasone (FLONASE) 50 MCG/ACT nasal spray Place 2 sprays into both nostrils daily.     indapamide (LOZOL) 2.5 MG tablet Take 1 tablet (2.5 mg total) by mouth daily. 90 tablet 3   nystatin cream (MYCOSTATIN) Apply 1 application topically 2 (two) times daily. 90 g 1   ondansetron (ZOFRAN-ODT) 4 MG disintegrating tablet Take 1 tablet (4 mg total) by mouth every 8 (eight) hours as needed for nausea or vomiting. 20 tablet 0   OVER THE COUNTER MEDICATION Take 1 tablet by mouth daily. Prevagen - once daily     pantoprazole (PROTONIX) 40 MG tablet Take 1 tablet (40 mg total) by mouth daily. 90 tablet 3   promethazine (PHENERGAN) 25 MG tablet Take 1 tablet (25 mg total) by mouth every 6 (six) hours as needed for nausea or vomiting.  As needed 30 tablet 0   sertraline (ZOLOFT) 25 MG tablet Take 1 tablet (25 mg total) by mouth daily. 90 tablet 3   sucralfate (CARAFATE) 1 g tablet Take 1 tablet (1 g total) by mouth daily before supper. 90 tablet 3   triamcinolone ointment (KENALOG) 0.1 % Apply 1 application topically 2 (two) times daily. 80 g 1   No current facility-administered medications for this visit.    Allergies as of 02/22/2022 - Review Complete 02/22/2022  Allergen Reaction Noted   Penicillins Hives, Rash, and Other (See Comments) 07/24/2017   Insulin lispro prot & lispro Other (See Comments) 07/11/2018   Semaglutide Nausea And Vomiting 06/28/2019   Metformin Diarrhea 06/28/2019    Family History  Problem Relation Age of Onset   Diabetes Mellitus II Mother    Cerebrovascular Disease Mother    Hypertension Father    Heart Problems Brother        pacemaker   Diabetes Mellitus II Brother    Bipolar disorder Brother     Social History   Socioeconomic History   Marital status: Widowed    Spouse name: Not on file   Number of children: 2   Years of education: Not on file   Highest  education level: Not on file  Occupational History   Occupation: substitute teacher    Employer: [employer]   Occupation: Retired Scientist, water quality: [place]: [employer] [place] [employer]  Tobacco Use   Smoking status: Former    Packs/day: 1.00    Types: Cigarettes    Quit date: 2004    Years since quitting: 19.7   Smokeless tobacco: Never  Vaping Use   Vaping Use: Never used  Substance and Sexual Activity   Alcohol use: Not Currently    Alcohol/week: 1.0 standard drink of alcohol    Types: 1 Glasses of wine per week    Comment: wine.  occasional   Drug use: No   Sexual activity: Not Currently  Other Topics Concern   Not on file  Social History Narrative   Lives with daughter   Marciano Sequin 1 a day   Pharmacist, hospital for 10 years   Worked as first Quarry manager in [place] Strain: [place]  (02/15/2021)   Overall Financial Resource Strain (CARDIA)    Difficulty of Paying Living Expenses: Not very hard  Food Insecurity: No Food Insecurity (02/15/2021)   Hunger Vital Sign    Worried About Running Out of Food in the Last Year: Never true    Ran Out of Food in the Last Year: Never true  Transportation Needs: Unmet Transportation Needs (03/22/2021)   PRAPARE - Hydrologist (Medical): Not on file    Lack of Transportation (Non-Medical): Yes  Physical Activity: Not on file  Stress: Stress Concern Present (02/15/2021)   [place]    Feeling of Stress : Rather much  Social Connections: Unknown (03/22/2021)   Social Connection and Isolation Panel [NHANES]    Frequency of Communication with Friends and Family: More than three times a week    Frequency of Social Gatherings with Friends and Family: More than three times a week    Attends Religious Services: Patient refused    Active Member of  Clubs or Organizations: No    Attends Archivist Meetings: Never  Marital Status: Divorced  Human resources officer Violence: Not on file    Review of Systems:    Constitutional: No weight loss, fever or chills Cardiovascular: No chest pain  Respiratory: No SOB Gastrointestinal: See HPI and otherwise negative   Physical Exam:  Vital signs: BP (!) 150/76   Pulse 74   Ht 5\' 5"  (1.651 m)   Wt 156 lb (70.8 kg)   BMI 25.96 kg/m   Constitutional:   Pleasant Elderly Caucasian female appears to be in NAD, Well developed, Well nourished, alert and cooperative  Respiratory: Respirations even and unlabored. Lungs clear to auscultation bilaterally.   No wheezes, crackles, or rhonchi.  Cardiovascular: Normal S1, S2. No MRG. Regular rate and rhythm. No peripheral edema, cyanosis or pallor.  Gastrointestinal:  Soft, nondistended, nontender. No rebound or guarding. Normal bowel sounds. No appreciable masses or hepatomegaly. Rectal:  Not performed.  Msk:  Symmetrical without gross deformities. Without edema, no deformity or joint abnormality.  Psychiatric: Oriented to person, place and time. Demonstrates good judgement and reason without abnormal affect or behaviors.  RELEVANT LABS AND IMAGING: CBC    Component Value Date/Time   WBC 7.1 12/17/2021 1458   WBC 7.3 08/31/2021 2034   RBC 4.54 12/17/2021 1458   RBC 4.60 08/31/2021 2034   HGB 12.1 12/17/2021 1458   HCT 36.1 12/17/2021 1458   PLT 288 12/17/2021 1458   MCV 80 12/17/2021 1458   MCH 26.7 12/17/2021 1458   MCH 26.7 08/31/2021 2034   MCHC 33.5 12/17/2021 1458   MCHC 32.5 08/31/2021 2034   RDW 14.9 12/17/2021 1458   LYMPHSABS 1.6 06/07/2021 1140   MONOABS 0.6 12/28/2020 0535   EOSABS 0.2 06/07/2021 1140   BASOSABS 0.0 06/07/2021 1140    CMP     Component Value Date/Time   NA 140 11/05/2021 1012   K 4.7 11/05/2021 1012   CL 106 11/05/2021 1012   CO2 20 11/05/2021 1012   GLUCOSE 150 (H) 11/05/2021 1012   GLUCOSE 129  (H) 08/31/2021 2034   BUN 24 11/05/2021 1012   CREATININE 1.57 (H) 11/05/2021 1012   CALCIUM 9.1 11/05/2021 1012   PROT 6.4 06/07/2021 1140   ALBUMIN 4.2 06/07/2021 1140   AST 21 06/07/2021 1140   ALT 18 06/07/2021 1140   ALKPHOS 107 06/07/2021 1140   BILITOT 0.4 06/07/2021 1140   GFRNONAA 31 (L) 08/31/2021 2034   GFRAA >60 11/11/2019 1200    Assessment: 1.  Nausea and vomiting: Previous EGD with gastritis and duodenitis, currently on Pantoprazole 40 mg daily and Carafate before dinner, symptoms 2 to 3 days out of the week, worse with a large meal; consider gastroparesis +/- ongoing gastritis/duodenitis 2.  Epigastric pain: With above 3.  Diarrhea: Episodes of diarrhea with the symptoms above, previous history of C. difficile but this does not sound the same as she has soft solid stools in between  Plan: 1.  Discussed with patient that if she sees a correlation between eating large meals and her symptoms then I have suspicion that she may be dealing with gastroparesis given that she only has 2-3 bad days in the week.  Ordered a gastric emptying study for further evaluation. 2.  Pending results from above can increase PPI/Carafate to see if this is helpful.  If patient reaches max therapy at some point and is still having symptoms then will need to consider repeat EGD. 3.  Patient to follow in clinic with me in 1 to 2 months or sooner if necessary.  Ellouise Newer, PA-C [facility] 02/22/2022, 11:01 AM  Cc: Martyn Malay, MD

## 2022-02-23 DIAGNOSIS — D692 Other nonthrombocytopenic purpura: Secondary | ICD-10-CM | POA: Insufficient documentation

## 2022-02-23 DIAGNOSIS — R413 Other amnesia: Secondary | ICD-10-CM | POA: Insufficient documentation

## 2022-02-24 ENCOUNTER — Ambulatory Visit (INDEPENDENT_AMBULATORY_CARE_PROVIDER_SITE_OTHER): Payer: PPO | Admitting: Family Medicine

## 2022-02-24 VITALS — BP 192/64 | HR 65 | Ht 65.0 in | Wt 158.2 lb

## 2022-02-24 DIAGNOSIS — R413 Other amnesia: Secondary | ICD-10-CM

## 2022-02-24 DIAGNOSIS — I679 Cerebrovascular disease, unspecified: Secondary | ICD-10-CM

## 2022-02-24 DIAGNOSIS — Z23 Encounter for immunization: Secondary | ICD-10-CM

## 2022-02-24 DIAGNOSIS — G3184 Mild cognitive impairment, so stated: Secondary | ICD-10-CM

## 2022-02-24 NOTE — Patient Instructions (Signed)
Thank you for coming into our clinic.   We will be asking the social worker to contact you about your CAPS application.

## 2022-02-25 ENCOUNTER — Telehealth: Payer: Self-pay

## 2022-02-25 ENCOUNTER — Encounter: Payer: Self-pay | Admitting: Family Medicine

## 2022-02-25 NOTE — Progress Notes (Addendum)
Provider:  Lissa Morales, MD Location:      Place of Service:     PCP: Martyn Malay, MD Patient Care Team: Martyn Malay, MD as PCP - General [facility] Medicine)  Extended Emergency Contact Information Primary Emergency Contact: Hourihan,[name] Address: [address]          [place], [place] [address] Johnnette Litter of [place] Phone: [phone] Relation: Daughter Secondary Emergency Contact: Neita Goodnight Address: [place], [place] of [employer] Phone: [phone] Relation: Granddaughter  Code Status: DNR Goals of Care: Advanced Directive information    02/24/2022    1:38 PM  Advanced Directives  Does Patient Have a Medical Advance Directive? Yes  Type of Advance Directive Living will  Does patient want to make changes to medical advance directive? No - Patient declined     [facility]:   Patient is accompanied by  Dgt Primary caregiver:  Self Patient's Currently living arrangement:  daughter  Patient information was obtained from historical medical records and lab reports  and MRI reports, also from her attending daughter,  History/Exam limitations:  Lauren Craig was resistant to participating in the visit.  She thought she would be seeing Dr Owens Shark.   Lauren Craig agreed to proceed with the visit after being invited to end the visit before starting the evaluation.    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      HPI by problems:  Chief Complaint  Patient presents with   Memory Loss   Dr Owens Shark noted in her last visit  with patient the daughter's observation of patient being confused and disoriented during a out of state trip.   Speech Therapy performed COGNISTAT evaluation of patient during her post-stroke CIR admission in July lo last year.  Lauren Craig performed in normal range for 10 cognitive domains except for short-term memory where she showed moderate impairment.  In March 2023, Dr Read Drivers (PM&R) thought Lauren Craig was moderately independent for both simple and complex ADLs and thought she had no further Physical Therapy/Occupational Therapy/Speech Therapy needs.   Patient's goal for visit was "to get it over with." Her daughter's goal was assess her memory and see if we could help with their application for CAP/DA application.   Cognitive impairment concern  Are there problems with thinking?  Cognition domains: Memory difficulties   When were the changes first noticed?  Last year after first stroke in February of 2022  Did this change occur abruptly or gradually?  abrupt  How have the changes progressed since then?  Memory difficulty has progressed with   Has there been any tremors or abnormal movements?  no    Compared to 5 to 10 years ago, how is the patient at:  Problems with Judgment, e.g., problem making decisions, bad financial decisions, problems with thinking?  no  Less interested in hobbies or previously enjoyed activities?  no  Problem remembering things about family and friends e.g. names,  occupations, birthdays, addresses?  no  Problem remembering conversations or news events a few days later?  yes  Problem remembering what day and month it is? no  Problem with losing things?  Yes, though this preceded the onset of memory difficulties.  Problem learning to use a new gadget or machine around the house, e.g., cell phones,  computer, microwave, remote control?  yes  Problem with handling money for shopping?  no  Problem handling financial matters, e.g. their  pension, checking, credit cards, dealing with the bank?  no  Problem with getting lost in familiar places?  no   Geriatric Depression Scale:  0 / 15  PHQ-9: [place] Office Visit from 12/17/2021 in [place]  PHQ-9 Total Score 0        Outpatient Encounter Medications as of 02/24/2022  Medication Sig   acetaminophen (TYLENOL) 325 MG tablet Take 2 tablets (650 mg total) by mouth every 6 (six) hours as needed for headache.   albuterol (VENTOLIN HFA) 108 (90 Base) MCG/ACT inhaler Inhale 2 puffs into the lungs every 6 (six) hours as needed for wheezing or shortness of breath.   amLODipine (NORVASC) 10 MG tablet Take 1 tablet (10 mg total) by mouth daily.   atorvastatin (LIPITOR) 80 MG tablet Take 1 tablet (80 mg total) by mouth at bedtime.   carvedilol (COREG) 6.25 MG tablet Take 6.25 mg by mouth daily.   clopidogrel (PLAVIX) 75 MG tablet Take 75 mg by mouth daily.   enalapril (VASOTEC) 20 MG tablet Take 1 tablet (20 mg total) by mouth at bedtime.   ezetimibe (ZETIA) 10 MG tablet Take 1 tablet (10 mg total) by mouth daily.   famotidine (PEPCID) 10 MG tablet Take 1 tablet (10 mg total) by mouth daily.   fidaxomicin (DIFICID) 200 MG TABS tablet Take 1 tablet by mouth 2 times daily.   fluticasone (FLONASE) 50 MCG/ACT nasal spray Place 2 sprays into both nostrils daily.   indapamide (LOZOL) 2.5 MG tablet Take 1 tablet (2.5 mg total) by mouth daily.   nystatin cream (MYCOSTATIN) Apply 1 application topically 2 (two) times daily.   ondansetron (ZOFRAN-ODT) 4 MG disintegrating tablet Take 1 tablet (4 mg total) by mouth every 8 (eight) hours as needed for nausea or vomiting.   OVER THE COUNTER MEDICATION Take 1 tablet by mouth daily. Prevagen - once daily   pantoprazole (PROTONIX) 40 MG tablet Take 1 tablet (40 mg total) by mouth daily.   promethazine (PHENERGAN) 25 MG tablet Take 1 tablet (25 mg total) by mouth every 6 (six) hours as needed for nausea or vomiting. As  needed   sertraline (ZOLOFT) 25 MG tablet Take 1 tablet (25 mg total) by mouth daily.   sucralfate (CARAFATE) 1 g tablet Take 1 tablet (1 g total) by mouth daily before supper.   triamcinolone ointment (KENALOG) 0.1 % Apply 1 application topically 2 (two) times daily.   No facility-administered encounter medications on file as of 02/24/2022.    History Patient Active Problem List   Diagnosis Date Noted   Senile purpura ([place]) 02/23/2022    Priority: High   Chronic kidney disease (CKD), stage III (moderate) ([place]) 08/27/2021    Priority: High   Controlled diabetes mellitus type 2 with complications ([place]) 87/56/4332    Priority: High   PVD (peripheral vascular disease) ([place]) 2010    Priority: High   Cerebrovascular disease 11/08/2020    Priority: Medium    HTN (hypertension) Difficult to Control with Isolated Systolic HTN  95/18/8416    Priority: Medium    Hyperlipidemia 10/03/2017    Priority: Medium    Arthritis 03/15/2021    Priority: Low   Visual field defect 11/08/2020    Priority: Low   Vitamin B12 deficiency 2002    Priority: Low   Memory changes 02/23/2022   Esophagitis  03/15/2021   Renal cyst 12/17/2020   Past Medical History:  Diagnosis Date   Anxiety disorder    Arthritis    hands, hips   C. 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   Vitamin B12 deficiency    Past Surgical History:  Procedure Laterality Date   ABDOMINAL AORTOGRAM W/LOWER EXTREMITY N/A 08/11/2017   Procedure: ABDOMINAL AORTOGRAM W/LOWER EXTREMITY;  Surgeon: Angelia Mould, MD;  Location: [facility];  Service: Cardiovascular;  Laterality: N/A;   ABDOMINAL HYSTERECTOMY     AMPUTATION Left 11/14/2019   Procedure: Left Fifth ray amputation;   Surgeon: Wylene Simmer, MD;  Location: [facility];  Service: Orthopedics;  Laterality: Left;   AMPUTATION TOE Left 05/17/2018   Procedure: 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;  Surgeon: Wylene Simmer, MD;  Location: [facility];  Service: Orthopedics;  Laterality: Left;   BIOPSY  10/29/2020   Procedure: BIOPSY;  Surgeon: Irene Shipper, MD;  Location: [facility];  Service: Endoscopy;;   ESOPHAGOGASTRODUODENOSCOPY (EGD) WITH PROPOFOL N/A 10/29/2020   Procedure: ESOPHAGOGASTRODUODENOSCOPY (EGD) WITH PROPOFOL;  Surgeon: Irene Shipper, MD;  Location: [facility]COPY;  Service: Endoscopy;  Laterality: N/A;   PARTIAL HYMENECTOMY  1968   TONSILLECTOMY AND ADENOIDECTOMY     VAGINAL HYSTERECTOMY  1987   Family History  Problem Relation Age of Onset   Diabetes Mellitus II Mother    Cerebrovascular Disease Mother    Hypertension Father    Heart Problems Brother        pacemaker   Diabetes Mellitus II Brother    Bipolar disorder Brother    Social History   Socioeconomic History   Marital status: Widowed    Spouse name: Not on file   Number of children: 2   Years of education: 14   Highest education level: Associate degree: academic program  Occupational History   Occupation: Scientist, product/process development: [employer]   Occupation: Retired Scientist, 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  occasional   Drug use: No   Sexual activity: Not Currently  Other Topics Concern   Not on file  Social History Narrative   Lives  with daughter   Marciano Sequin 1 a day   Teacher for 10 years   Worked as first Quarry manager in [place] Strain: [place]  (02/15/2021)   Overall Financial Resource Strain (CARDIA)    Difficulty of Paying Living Expenses: Not very hard  Food Insecurity: No Food Insecurity (02/15/2021)   Hunger Vital Sign    Worried About Running  Out of Food in the Last Year: Never true    Ran Out of Food in the Last Year: Never true  Transportation Needs: Unmet Transportation Needs (03/22/2021)   PRAPARE - Hydrologist (Medical): Not on file    Lack of Transportation (Non-Medical): Yes  Physical Activity: Not on file  Stress: Stress Concern Present (02/15/2021)   [place]    Feeling of Stress : Rather much  Social Connections: Unknown (03/22/2021)   Social Connection and Isolation Panel [NHANES]    Frequency of Communication with Friends and Family: More than three times a week    Frequency of Social Gatherings with Friends and Family: More than three times a week    Attends Religious Services: Patient refused    Marine scientist or Organizations: No    Attends Music therapist: Never    Marital Status: Divorced      Cardiovascular Risk Factors: CVA, Hypertension, Lipids, and NIDDM  Educational History: 14 years formal education Personal History of Seizures: No -  Personal History of Stroke: Yes - Three episodes of ischemic infarctions Personal History of Head Trauma: No -  Personal History of Psychiatric Disorders: No -  Family History of Dementia: Yes - sister   Basic Activities of Daily Living  Dressing: Self-care Eating: Self-care Ambulation: Self-care Toileting: Self-care Bathing: Self-care  Instrumental Activities of Daily Living Shopping: Self-care House/Yard Work: Self-care Administration of medications:  Self-care Finances: Self-care Telephone: Self-care Transportation: Self-care. While she does not drive anymore, she arranges her own transportation.   Mobility Assist Devices: No assist devices  Caregivers in home: self     Formal Home Health Assistance  Physical Therapy: no  Occupational Therapy: no             Home Aid / Personal Care Service: no             Homemaker services: no  FALLS in last five office visits:     12/17/2021   11:21 AM 07/29/2021   11:49 AM 07/05/2021   10:32 AM 04/21/2021    3:35 PM 03/22/2021    2:12 PM  Fall Risk   Falls in the past year? 0 1 0 1 0  Number falls in past yr: 0 0 0 0   Injury with Fall? 0 1 0 1   Comment  november 2022  left knee   Risk for fall due to :  History of fall(s);Impaired balance/gait  Other (Comment)   Risk for fall due to: Comment    She tripped over her purse strap   Follow up    Falls evaluation completed;Education provided;Falls prevention discussed     Health Maintenance reviewed: Immunization History  Administered Date(s) Administered   Fluad Quad(high Dose 65+) 02/24/2022   Influenza,inj,Quad PF,6+ Mos 03/01/2021   Influenza-Unspecified 04/27/2016, 02/17/2017, 03/04/2019   Moderna Sars-Covid-2 Vaccination 06/24/2019, 07/22/2019, 03/27/2020   PFIZER Comirnaty(Gray Top)Covid-19 Tri-Sucrose Vaccine 01/29/2021   PNEUMOCOCCAL CONJUGATE-20 03/01/2021   Pneumococcal Conjugate-13 05/30/2014   Pneumococcal Polysaccharide-23 03/04/2019   Tdap 06/23/2021   Zoster Recombinat (Shingrix) 06/23/2021   Health Maintenance Topics with due status: Overdue     Topic Date Due   OPHTHALMOLOGY EXAM Never done   DEXA SCAN Never done   Diabetic kidney evaluation - Urine ACR 10/12/2019   COVID-19 Vaccine 03/26/2021   Zoster Vaccines- Shingrix 08/18/2021    Diet: Diabetic Nutritional supplements: none  Geriatric Syndromes: Constipation no  Incontinence no  Nocturia: no Dizziness no   Syncope no  Visual Impairment no    Hearing impairment no Dentures problems: no Impaired Memory or Cognition no   Sleep problems no   Weight loss no Drug Misadventure: no   Joint pain: yes, arms Ankle edema: no History of UTIs: no   Vital Signs Weight: 158 lb 4 oz (71.8 kg) Body mass index is 26.33 kg/m. CrCl cannot be calculated (Patient's most recent lab result is older than the maximum 21 days allowed.). Body surface area is 1.81 meters squared. Vitals:   02/24/22 1340  BP: (!) 192/64  Pulse: 65  SpO2: 100%  Weight: 158 lb 4 oz (71.8 kg)  Height: 5\' 5"  (1.651 m)   Wt Readings from Last 3 Encounters:  02/24/22 158 lb 4 oz (71.8 kg)  02/22/22 156 lb (70.8 kg)  12/17/21 163 lb 6.4 oz (74.1 kg)   Hearing Screening   250Hz  500Hz  1000Hz  2000Hz  4000Hz   Right ear 20 40 25 20 Fail  Left ear 20 20 20 20  Fail   Vision Screening   Right eye Left eye Both eyes  Without correction     With correction 20/20 20/40 20/20     Physical Examination:  VS reviewed Physical Exam  Vitals:   02/24/22 1340  BP: (!) 192/64  Pulse: 65  SpO2: 100%     Mini-Mental State Examination or Montreal Cognitive Assessment:  Patient did  require additional cues or prompts to complete tasks. Patient was not cooperative and attentive to testing tasks Patient did not appear motivated to perform well Photography of MoCA was put into Patient's EMR  Patient refused perform serial 7 subtraction, then requested to stop the test entirely at language Fluency test portion.  She did want to let tester know that she recalled three of the 5 works from beginning of test.      02/25/2022   12:13 PM  [facility] Cognitive Assessment   Visuospatial/ Executive (0/5) 1  Naming (0/3) 3  Attention: Read list of digits (0/2) 2  Attention: Read list of letters (0/1) 1  Language: Repeat phrase (0/2) 2  Delayed Recall (0/5) 3     Labs No components found for: "VITAMIND"  Lab Results  Component Value Date   VITAMINB12 312 12/17/2021     No results found for: "FOLATE"  Lab Results  Component Value Date   TSH 2.500 12/17/2021    No results found for: "RPR"  No results found for: "HIV"    Chemistry      Component Value Date/Time   NA 140 11/05/2021 1012   K 4.7 11/05/2021 1012   CL 106 11/05/2021 1012   CO2 20 11/05/2021 1012   BUN 24 11/05/2021 1012   CREATININE 1.57 (H) 11/05/2021 1012      Component Value Date/Time   CALCIUM 9.1 11/05/2021 1012   ALKPHOS 107 06/07/2021 1140   AST 21 06/07/2021 1140   ALT 18 06/07/2021 1140   BILITOT 0.4 06/07/2021 1140       CrCl cannot be calculated (Patient's most recent lab result is older than the maximum 21 days allowed.).   Lab Results  Component Value Date   HGBA1C 6.6 11/05/2021     @10RELATIVEDAYS @ Hearing Screening   250Hz  500Hz  1000Hz  2000Hz  4000Hz   Right ear 20 40 25 20 Fail  Left ear 20 20 20 20  Fail   Vision Screening   Right eye Left eye Both eyes  Without correction     With  correction 20/20 20/40 20/20    Lab Results  Component Value Date   WBC 7.1 12/17/2021   HGB 12.1 12/17/2021   HCT 36.1 12/17/2021   MCV 80 12/17/2021   PLT 288 12/17/2021      Imaging  Brain MRI: 12/18/20 acute infarction in right internal capsule and left corona radiata, encephalomalacia in right internal capsule with superimposed acute infarction.    Assessment and Plan: Please see individual consultation notes from physical therapy, pharmacy and social work for today.    Problem List Items Addressed This Visit       Medium    Cerebrovascular disease   Relevant Orders   AMB Referral to [facility]     Unprioritized   Memory changes    Lauren Craig was referred for concerns about her memory. She was resistant to the visit, but did participate in most of the interview, and some of the cognitive testing.  It is difficult to say that any cognitive impairments that may be present are present given her resistance to the cognitive  testing. She said she was tired from the interviewing and the beginning of the MoCA testing.  Lauren Craig may have lower threshold for cognitive fatigue.  Repeat MoCA testing at the beginning of a visit may allow her to complete the testing before mental fatigue begins.   She may have of Visuospatial / executive and complex attention difficulites from her prior CNS ischemic insults.  Her MoCA score adjusted for the portions she did complete give a score of 12 out of 17 that would adjust to 21 out of 30. 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    Contol of CV risk fators to help prevent future strokeswill help preserve her cogition and function.  Others include the mediterranean diet, walking most days of the week, and social engagement help slow the decline of memory in MCI.    A referral to [place] to help patient with application for CAP/DA was sent.         Other Visit Diagnoses     Need for immunization against influenza    -  Primary   Relevant Orders   Flu Vaccine QUAD High Dose(Fluad) (Completed)   Mild cognitive impairment       Relevant Orders   AMB Referral to [place]      Memory changes Lauren Craig was referred for concerns about her memory. She was resistant to the visit, but did participate in most of the interview, and some of the cognitive testing.  It is difficult to say that any cognitive impairments that may be present are present given her resistance to the cognitive testing. She said she was tired from the interviewing and the beginning of the MoCA testing.  Lauren Mccaskill may have lower threshold for cognitive fatigue.  Repeat MoCA testing at the beginning of a visit may allow her to complete the testing before mental fatigue begins.   She may have of Visuospatial /  executive and complex attention difficulites from her prior CNS ischemic insults.  Her MoCA score adjusted for the portions she did complete give a score of 12 out of 17 that would adjust to 21 out of 30. 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    Contol of CV risk fators to help prevent future strokeswill help preserve her cogition and function.  Others include the mediterranean diet, walking most days of the week, and social engagement help slow the decline of memory in MCI.    A referral to [place] to help patient with application for CAP/DA was sent.    50 minutes were taken in the previewing of patient's meidcal information, interview, and discussion with patient and family.     Primary Contact: Extended Emergency Contact Information Primary Emergency Contact: Girten,[name] Address: [address]          [place], [place] [address] Johnnette Litter of [place] Phone: [phone] Relation: Daughter Secondary Emergency Contact: Neita Goodnight Address: [place], [place] of [place] Phone: [phone] Relation: Granddaughter

## 2022-02-25 NOTE — Telephone Encounter (Signed)
   Telephone encounter was:  Unsuccessful.  02/25/2022 Name: Kahlia Lagunes MRN: [id number] DOB: 1945/11/23  Unsuccessful outbound call made today to assist with:   DHHS  Outreach Attempt:  1st Attempt  Unable to leave a message   [place], [facility]  [phone] [address], [place], [place] [address] Phone: [phone] Email: [email or web address]

## 2022-02-25 NOTE — Assessment & Plan Note (Signed)
Lauren Craig was referred for concerns about her memory. She was resistant to the visit, but did participate in most of the interview, and some of the cognitive testing.  It is difficult to say that any cognitive impairments that may be present are present given her resistance to the cognitive testing. She said she was tired from the interviewing and the beginning of the MoCA testing.  Lauren Craig may have lower threshold for cognitive fatigue.  Repeat MoCA testing at the beginning of a visit may allow her to complete the testing before mental fatigue begins.   She may have of Visuospatial / executive and complex attention difficulites from her prior CNS ischemic insults.  Her MoCA score adjusted for the portions she did complete give a score of 12 out of 17 that would adjust to 21 out of 30. 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    Contol of CV risk fators to help prevent future strokeswill help preserve her cogition and function.  Others include the mediterranean diet, walking most days of the week, and social engagement help slow the decline of memory in MCI.    A referral to [place] to help patient with application for CAP/DA was sent.

## 2022-02-28 ENCOUNTER — Telehealth: Payer: Self-pay

## 2022-02-28 NOTE — Telephone Encounter (Signed)
   Telephone encounter was:  Successful.  02/28/2022 Name: [name] MRN: [id number] DOB: 13-Feb-1946  Lauren Craig is a 76 y.o. year old female who is a primary care patient of Martyn Malay, MD . The community resource team was consulted for assistance with  medication  Care guide performed the following interventions: Patient provided with information about care guide support team and interviewed to confirm resource needs.Patient need DHHS CAPS Program informatio so she can follow up about her application  Follow Up Plan:  No further follow up planned at this time. The patient has been provided with needed resources.    [place], Care Management  [phone] [address], [place], [place] [address] Phone: [phone] Email: [email or web address]

## 2022-03-11 ENCOUNTER — Telehealth: Payer: Self-pay | Admitting: *Deleted

## 2022-03-11 ENCOUNTER — Encounter (HOSPITAL_COMMUNITY): Payer: PPO

## 2022-03-11 ENCOUNTER — Encounter (HOSPITAL_COMMUNITY)
Admission: RE | Admit: 2022-03-11 | Discharge: 2022-03-11 | Disposition: A | Discharge status: Home / Self Care | Payer: PPO | Source: Ambulatory Visit | Attending: Physician Assistant | Admitting: Physician Assistant

## 2022-03-11 DIAGNOSIS — R112 Nausea with vomiting, unspecified: Secondary | ICD-10-CM | POA: Insufficient documentation

## 2022-03-11 DIAGNOSIS — R197 Diarrhea, unspecified: Secondary | ICD-10-CM | POA: Diagnosis not present

## 2022-03-11 DIAGNOSIS — R1013 Epigastric pain: Secondary | ICD-10-CM | POA: Diagnosis not present

## 2022-03-11 MED ORDER — TECHNETIUM TC 99M SULFUR COLLOID: 2.1200 | Freq: Once | INTRAVENOUS | Status: DC | PRN

## 2022-03-11 NOTE — Chronic Care Management (AMB) (Signed)
  Care Coordination  Outreach Note  03/11/2022 Name: Lauren Craig MRN: [id number] DOB: Dec 08, 1945   Care Coordination Outreach Attempts: An unsuccessful telephone outreach was attempted today to offer the patient information about available care coordination services as a benefit of their health plan.   Follow Up Plan:  Additional outreach attempts will be made to offer the patient care coordination information and services.   Encounter Outcome:  No Answer  [place]  Direct Dial: [phone]

## 2022-03-14 ENCOUNTER — Other Ambulatory Visit: Payer: Self-pay

## 2022-03-14 NOTE — Chronic Care Management (AMB) (Signed)
  Care Coordination  Outreach Note  03/14/2022 Name: Lauren Craig MRN: [id number] DOB: [birth year]   Care Coordination Outreach Attempts: A second unsuccessful outreach was attempted today to offer the patient with information about available care coordination services as a benefit of their health plan.     Follow Up Plan:  Additional outreach attempts will be made to offer the patient care coordination information and services.   Encounter Outcome:  No Answer  [place]  Direct Dial: [phone]

## 2022-03-18 NOTE — Chronic Care Management (AMB) (Signed)
  Care Coordination   Note   03/18/2022 Name: Lauren Craig MRN: [id number] DOB: 1946/02/07  Callee Rohrig is a 76 y.o. year old female who sees Martyn Malay, MD for primary care. I reached out to Cathleen Fears by phone today to offer care coordination services.  Ms. Luba was given information about Care Coordination services today including:   The Care Coordination services include support from the care team which includes your Nurse Coordinator, Clinical Social Worker, or Pharmacist.  The Care Coordination team is here to help remove barriers to the health concerns and goals most important to you. Care Coordination services are voluntary, and the patient may decline or stop services at any time by request to their care team member.   Care Coordination Consent Status: Patient daughter Lauren Craig agreed to services and verbal consent obtained.   Follow up plan:  Telephone appointment with care coordination team member scheduled for:  03/21/22  Encounter Outcome:  Pt. Scheduled  [place]  Direct Dial: [phone]

## 2022-03-21 ENCOUNTER — Ambulatory Visit: Payer: Self-pay | Admitting: Licensed Clinical Social Worker

## 2022-03-21 NOTE — Patient Instructions (Signed)
Visit Information  Thank you for taking time to visit with me today. Please don't hesitate to contact me if I can be of assistance to you.   Following are the goals we discussed today:   Goals Addressed               This Visit's Progress     Care Coordination Activities- SNAP/ MEDICAID/ SNAP (pt-stated)        Patient requested assistance with SNAP/ MEDICAID and CAP. SW assisted patient with services. SW also emailed patient information regarding programs. Patient having difficulty reaching a representative for CAP. SW encourage patient to continue calling. SW also gave patient an email address for CAP.   SDOH assessment completed. No further needs.           Patient verbalizes understanding of instructions and care plan provided today and agrees to view in [place]. Active MyChart status and patient understanding of how to access instructions and care plan via MyChart confirmed with patient.     No further follow up required: .  Milus Height, Arita Miss , MSW, [place] Social Worker [facility] Care Management  [phone]

## 2022-03-21 NOTE — Patient Outreach (Signed)
  Care Coordination   Initial Visit Note   03/21/2022 Name: Lauren Craig MRN: [id number] DOB: 1945/11/20  Lauren Craig is a 76 y.o. year old female who sees Martyn Malay, MD for primary care. I spoke with  Lauren Craig by phone today.  What matters to the patients health and wellness today?  FNS/ MEDICAID/ CAP    Goals Addressed               This Visit's Progress     Care Coordination Activities- SNAP/ MEDICAID/ SNAP (pt-stated)        Patient requested assistance with SNAP/ MEDICAID and CAP. SW assisted patient with services. SW also emailed patient information regarding programs. Patient having difficulty reaching a representative for CAP. SW encourage patient to continue calling. SW also gave patient an email address for CAP.   SDOH assessment completed. No further needs.         SDOH assessments and interventions completed:  Yes  SDOH Interventions Today    Flowsheet Row Most Recent Value  SDOH Interventions   Food Insecurity Interventions Assist with SNAP Application  Financial Strain Interventions Other (Comment)  [Assisted patient with applying for medicaid]        Care Coordination Interventions Activated:  Yes  Care Coordination Interventions:  Yes, provided   Follow up plan: No further intervention required.   Encounter Outcome:  Pt. Visit Completed

## 2022-03-22 ENCOUNTER — Other Ambulatory Visit: Payer: Self-pay

## 2022-03-22 MED ORDER — PANTOPRAZOLE SODIUM 40 MG PO TBEC: DELAYED_RELEASE_TABLET | ORAL | 3 refills | Status: DC

## 2022-04-01 ENCOUNTER — Other Ambulatory Visit: Payer: Self-pay | Admitting: Family Medicine

## 2022-04-01 DIAGNOSIS — K209 Esophagitis, unspecified without bleeding: Secondary | ICD-10-CM

## 2022-04-01 MED ORDER — ONDANSETRON 4 MG PO TBDP: 4.0000 mg | ORAL_TABLET | Freq: Three times a day (TID) | ORAL | 0 refills | Status: DC | PRN

## 2022-04-05 ENCOUNTER — Other Ambulatory Visit: Payer: Self-pay | Admitting: Family Medicine

## 2022-04-05 ENCOUNTER — Ambulatory Visit (INDEPENDENT_AMBULATORY_CARE_PROVIDER_SITE_OTHER): Payer: PPO

## 2022-04-05 ENCOUNTER — Encounter: Payer: Self-pay | Admitting: Physician Assistant

## 2022-04-05 ENCOUNTER — Ambulatory Visit (INDEPENDENT_AMBULATORY_CARE_PROVIDER_SITE_OTHER): Payer: PPO | Admitting: Physician Assistant

## 2022-04-05 VITALS — BP 142/86 | HR 58 | Ht 64.0 in | Wt 159.4 lb

## 2022-04-05 DIAGNOSIS — R112 Nausea with vomiting, unspecified: Secondary | ICD-10-CM

## 2022-04-05 DIAGNOSIS — K219 Gastro-esophageal reflux disease without esophagitis: Secondary | ICD-10-CM | POA: Diagnosis not present

## 2022-04-05 DIAGNOSIS — Z23 Encounter for immunization: Secondary | ICD-10-CM | POA: Diagnosis not present

## 2022-04-05 DIAGNOSIS — R197 Diarrhea, unspecified: Secondary | ICD-10-CM | POA: Diagnosis not present

## 2022-04-05 DIAGNOSIS — K209 Esophagitis, unspecified without bleeding: Secondary | ICD-10-CM

## 2022-04-05 DIAGNOSIS — I679 Cerebrovascular disease, unspecified: Secondary | ICD-10-CM

## 2022-04-05 HISTORY — DX: Nausea with vomiting, unspecified: R11.2

## 2022-04-05 MED ORDER — ONDANSETRON 4 MG PO TBDP: 4.0000 mg | ORAL_TABLET | Freq: Three times a day (TID) | ORAL | 11 refills | Status: DC | PRN

## 2022-04-05 MED ORDER — PANTOPRAZOLE SODIUM 40 MG PO TBEC: DELAYED_RELEASE_TABLET | ORAL | 11 refills | Status: DC

## 2022-04-05 NOTE — Progress Notes (Signed)
Chief Complaint: Follow-up nausea and vomiting, diarrhea and abdominal pain  HPI:    Lauren Craig is a 76 year old female, known to Dr. Henrene Pastor, with a past medical history as listed below including PVD, stroke and multiple others, who returns to clinic today for follow-up for vomiting, diarrhea and abdominal pain.    06/01/2021 patient seen in clinic by Tye Savoy for recurrent intermittent nausea, vomiting and diarrhea.  She was evaluated in the hospital for the symptoms in June 2022 and found to be indeterminate for C. difficile infection and diarrhea resolved with a course of Vancomycin.  EGD was remarkable for severe esophagitis probably related to vomiting and severe duodenitis.  Symptoms resolved with better glucose control, PPI and Carafate.  At that time repeated C. difficile PCR, recommended diagnostic colonoscopy off Plavix if C. difficile negative.  Discussed repeat EGD due to recurrent nausea/persistent anemia/severe esophagitis and duodenitis.  Discussed a normocytic anemia on Plavix with no overt GI bleeding.  She was first noted to be anemic in early June and her hemoglobin had remained low stable around 11 since then.    08/28/2018 3 repeat C. difficile PCR positive.    08/31/21 patient started on Dificid for repeat C. difficile.    12/17/2021 patient seen by family medicine.  At that time discussed nausea and vomiting.  She found the trigger for her symptoms was if she ate large meals or ate quickly.    02/22/2022 patient seen in clinic and described it 2 to 3 days out of the week she could not eat and felt nauseous and often had vomiting or diarrhea or both.  Also some pain in her epigastrium.  That time discussed previous EGD with gastritis and duodenitis and ordered a gastric emptying study given that she had a lot of the symptoms with a larger meal.  Discussed that pending results from that could increase her PPI/Carafate to see if that was helpful.    03/11/2022 gastric emptying study  was normal.  She was told to increase her Pantoprazole to 40 twice daily.    Today, the patient returns to clinic accompanied by her daughter who assists with history.  They explained that she is doing much much better on the Pantoprazole 40 mg twice a day.  In fact she has not had any trouble over the past week at all.  Patient does tell me that prior to this she was still occasionally getting nauseous and likes to have the Zofran on hand and will take 1-2 tabs if they are going out to eat so that she can have the whole meal.  She would like a refill of this as well.  Denies any other new complaints or concerns.    Today she talks about a cousin that she found through [email or web address] in [place], they are going there for Thanksgiving.    Denies fever, chills, weight loss or symptoms that awaken her from sleep.  Past Medical History:  Diagnosis Date   Anxiety disorder    Arthritis    hands, hips   C. 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   Vitamin B12 deficiency     Past Surgical History:  Procedure Laterality Date   ABDOMINAL  AORTOGRAM W/LOWER EXTREMITY N/A 08/11/2017   Procedure: ABDOMINAL AORTOGRAM W/LOWER EXTREMITY;  Surgeon: Angelia Mould, MD;  Location: [facility];  Service: Cardiovascular;  Laterality: N/A;   ABDOMINAL HYSTERECTOMY     AMPUTATION Left 11/14/2019   Procedure: Left Fifth ray amputation;  Surgeon: Wylene Simmer, MD;  Location: [facility];  Service: Orthopedics;  Laterality: Left;   AMPUTATION TOE Left 05/17/2018   Procedure: 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;  Surgeon: Wylene Simmer, MD;  Location: [name];  Service: Orthopedics;  Laterality: Left;   BIOPSY  10/29/2020   Procedure: BIOPSY;   Surgeon: Irene Shipper, MD;  Location: [facility] END[facility]COPY;  Service: Endoscopy;;   ESOPHAGOGASTRODUODENOSCOPY (EGD) WITH PROPOFOL N/A 10/29/2020   Procedure: ESOPHAGOGASTRODUODENOSCOPY (EGD) WITH PROPOFOL;  Surgeon: Irene Shipper, MD;  Location: [facility] ENDOSCOPY;  Service: Endoscopy;  Laterality: N/A;   PARTIAL HYMENECTOMY  1968   TONSILLECTOMY AND ADENOIDECTOMY     VAGINAL HYSTERECTOMY  1987    Current Outpatient Medications  Medication Sig Dispense Refill   acetaminophen (TYLENOL) 325 MG tablet Take 2 tablets (650 mg total) by mouth every 6 (six) hours as needed for headache.     albuterol (VENTOLIN HFA) 108 (90 Base) MCG/ACT inhaler Inhale 2 puffs into the lungs every 6 (six) hours as needed for wheezing or shortness of breath. 18 g 3   amLODipine (NORVASC) 10 MG tablet Take 1 tablet (10 mg total) by mouth daily. 90 tablet 3   atorvastatin (LIPITOR) 80 MG tablet Take 1 tablet (80 mg total) by mouth at bedtime. 90 tablet 3   carvedilol (COREG) 6.25 MG tablet Take 6.25 mg by mouth daily.     clopidogrel (PLAVIX) 75 MG tablet Take 75 mg by mouth daily.     enalapril (VASOTEC) 20 MG tablet Take 1 tablet (20 mg total) by mouth at bedtime. 90 tablet 3   ezetimibe (ZETIA) 10 MG tablet Take 1 tablet (10 mg total) by mouth daily. 90 tablet 3   famotidine (PEPCID) 10 MG tablet Take 1 tablet (10 mg total) by mouth daily. 90 tablet 3   fidaxomicin (DIFICID) 200 MG TABS tablet Take 1 tablet by mouth 2 times daily. 20 tablet 0   fluticasone (FLONASE) 50 MCG/ACT nasal spray Place 2 sprays into both nostrils daily.     indapamide (LOZOL) 2.5 MG tablet Take 1 tablet (2.5 mg total) by mouth daily. 90 tablet 3   nystatin cream (MYCOSTATIN) Apply 1 application topically 2 (two) times daily. 90 g 1   ondansetron (ZOFRAN-ODT) 4 MG disintegrating tablet Take 1 tablet (4 mg total) by mouth every 8 (eight) hours as needed for nausea or vomiting. 20 tablet 0   OVER THE COUNTER MEDICATION Take 1 tablet by mouth daily. Prevagen  - once daily     pantoprazole (PROTONIX) 40 MG tablet Take 1 tablet (40 mg total) by mouth 2 (two) times daily, 30-60 minutes before breakfast and dinner. 60 tablet 3   promethazine (PHENERGAN) 25 MG tablet Take 1 tablet (25 mg total) by mouth every 6 (six) hours as needed for nausea or vomiting. As needed 30 tablet 0   sertraline (ZOLOFT) 25 MG tablet Take 1 tablet (25 mg total) by mouth daily. 90 tablet 3   sucralfate (CARAFATE) 1 g tablet Take 1 tablet (1 g total) by mouth daily before supper. 90 tablet 3   triamcinolone ointment (KENALOG) 0.1 % Apply 1 application topically 2 (two) times daily. 80 g 1   No  current facility-administered medications for this visit.    Allergies as of 04/05/2022 - Review Complete 04/05/2022  Allergen Reaction Noted   Penicillins Hives, Rash, and Other (See Comments) 07/24/2017   Insulin lispro prot & lispro Other (See Comments) 07/11/2018   Semaglutide Nausea And Vomiting 06/28/2019   Metformin Diarrhea 06/28/2019    Family History  Problem Relation Age of Onset   Diabetes Mellitus II Mother    Cerebrovascular Disease Mother    Hypertension Father    Heart Problems Brother        pacemaker   Diabetes Mellitus II Brother    Bipolar disorder Brother     Social History   Socioeconomic History   Marital status: Widowed    Spouse name: Not on file   Number of children: 2   Years of education: 14   Highest education level: Associate degree: academic program  Occupational History   Occupation: Scientist, product/process development: [employer]   Occupation: Retired Scientist, water quality: [employer] [place]: [employer]   Occupation: Arts development officer    Comment: part-time  Tobacco Use   Smoking status: Former    Packs/day: 1.00    Types: Cigarettes    Quit date: 2004    Years since quitting: 19.8   Smokeless tobacco: Never  Vaping Use   Vaping Use: Never used  Substance and Sexual Activity   Alcohol  use: Not Currently    Alcohol/week: 1.0 standard drink of alcohol    Types: 1 Glasses of wine per week    Comment: wine.  occasional   Drug use: No   Sexual activity: Not Currently  Other Topics Concern   Not on file  Social History Narrative   Lives with daughter   Marciano Sequin 1 a day   Pharmacist, hospital for 10 years   Worked as first Quarry manager in [place] Strain: Medium Risk (03/21/2022)   Overall Financial Resource Strain (CARDIA)    Difficulty of Paying Living Expenses: Somewhat hard  Food Insecurity: Food Insecurity Present (03/21/2022)   Hunger Vital Sign    Worried About Running Out of Food in the Last Year: Sometimes true    Ran Out of Food in the Last Year: Never true  Transportation Needs: No Transportation Needs (03/21/2022)   PRAPARE - Hydrologist (Medical): No    Lack of Transportation (Non-Medical): No  Physical Activity: Not on file  Stress: Stress Concern Present (02/15/2021)   [place]    Feeling of Stress : Rather much  Social Connections: Unknown (03/22/2021)   Social Connection and Isolation Panel [NHANES]    Frequency of Communication with Friends and Family: More than three times a week    Frequency of Social Gatherings with Friends and Family: More than three times a week    Attends Religious Services: Patient refused    Marine scientist or Organizations: No    Attends Music therapist: Never    Marital Status: Divorced  Human resources officer Violence: Not on file    Review of Systems:    Constitutional: No weight loss, fever or chills Cardiovascular: No chest pain Respiratory: No SOB  Gastrointestinal: See HPI and otherwise negative   Physical Exam:  Vital signs: BP (!) 142/86   Pulse (!) 58   Ht 5\' 4"  (1.626  m)   Wt 159 lb 6 oz (72.3 kg)   BMI 27.36 kg/m   Constitutional:    Pleasant Elderly Caucasian female appears to be in NAD, Well developed, Well nourished, alert and cooperative Respiratory: Respirations even and unlabored. Lungs clear to auscultation bilaterally.   No wheezes, crackles, or rhonchi.  Cardiovascular: Normal S1, S2. No MRG. Regular rate and rhythm. No peripheral edema, cyanosis or pallor.  Gastrointestinal:  Soft, nondistended, nontender. No rebound or guarding. Normal bowel sounds. No appreciable masses or hepatomegaly. Rectal:  Not performed.  Psychiatric: Oriented to person, place and time. Demonstrates good judgement and reason without abnormal affect or behaviors.  See HPI for recent imaging.  Assessment: 1.  Nausea and vomiting: Previous EGD with gastritis and duodenitis, recent gastric emptying study negative/normal, patient better after an increase in Pantoprazole to 40 twice daily 2.  Epigastric pain 3.  Diarrhea  Plan: 1.  Continue Pantoprazole 40 mg twice daily, prescribed #60 with 11 refills. 2.  Refilled Zofran 4 mg 1-2 tabs every 6-8 hours as needed for nausea #60 with 11 refills 3.  Patient to follow in clinic in a year or sooner if needed.  Ellouise Newer, PA-C [facility] 04/05/2022, 11:11 AM  Cc: Martyn Malay, MD

## 2022-04-05 NOTE — Progress Notes (Signed)
Noted  

## 2022-04-05 NOTE — Progress Notes (Signed)
Patient presents to nurse clinic for [place] vaccination. Administered in LD, site unremarkable, tolerated injection well.     Covid-19 Vaccination Clinic  Name:  Lauren Craig    MRN: [id number] DOB: [birth year]  04/05/2022  Lauren Craig was observed post Covid-19 immunization for 15 minutes without incident. She was provided with Vaccine Information Sheet and instruction to access the V-Safe system.   Lauren Craig was instructed to call 911 with any severe reactions post vaccine: Difficulty breathing  Swelling of face and throat  A fast heartbeat  A bad rash all over body  Dizziness and weakness    Talbot Grumbling, RN

## 2022-04-05 NOTE — Patient Instructions (Signed)
We have sent the following medications to your pharmacy for you to pick up at your convenience: Zofran & Pantoprazole  Follow up in 1 year.  It was a pleasure to see you today!  Thank you for trusting me with your gastrointestinal care!

## 2022-04-06 ENCOUNTER — Telehealth: Payer: Self-pay

## 2022-04-06 NOTE — Telephone Encounter (Signed)
Receipt confirmed by [employer].   Lyrik Buresh C Tamina Cyphers, RN  

## 2022-04-06 NOTE — Telephone Encounter (Signed)
Community message sent to [employer] for cane.   Will await response.   Talbot Grumbling, RN

## 2022-04-20 ENCOUNTER — Ambulatory Visit: Payer: PPO | Admitting: Physician Assistant

## 2022-05-15 ENCOUNTER — Emergency Department (HOSPITAL_COMMUNITY): Payer: PPO

## 2022-05-15 ENCOUNTER — Encounter (HOSPITAL_COMMUNITY): Payer: Self-pay | Admitting: Emergency Medicine

## 2022-05-15 ENCOUNTER — Other Ambulatory Visit: Payer: Self-pay

## 2022-05-15 ENCOUNTER — Inpatient Hospital Stay (HOSPITAL_COMMUNITY)
Admission: EM | Admit: 2022-05-15 | Discharge: 2022-05-31 | DRG: 040 | Disposition: A | Discharge status: Home Health | Payer: PPO | Attending: Internal Medicine | Admitting: Internal Medicine

## 2022-05-15 DIAGNOSIS — I129 Hypertensive chronic kidney disease with stage 1 through stage 4 chronic kidney disease, or unspecified chronic kidney disease: Secondary | ICD-10-CM | POA: Diagnosis present

## 2022-05-15 DIAGNOSIS — K5730 Diverticulosis of large intestine without perforation or abscess without bleeding: Secondary | ICD-10-CM | POA: Diagnosis not present

## 2022-05-15 DIAGNOSIS — N184 Chronic kidney disease, stage 4 (severe): Secondary | ICD-10-CM | POA: Diagnosis present

## 2022-05-15 DIAGNOSIS — E538 Deficiency of other specified B group vitamins: Secondary | ICD-10-CM | POA: Diagnosis present

## 2022-05-15 DIAGNOSIS — I160 Hypertensive urgency: Secondary | ICD-10-CM | POA: Diagnosis present

## 2022-05-15 DIAGNOSIS — Z7902 Long term (current) use of antithrombotics/antiplatelets: Secondary | ICD-10-CM | POA: Diagnosis not present

## 2022-05-15 DIAGNOSIS — I10 Essential (primary) hypertension: Secondary | ICD-10-CM | POA: Diagnosis not present

## 2022-05-15 DIAGNOSIS — N183 Chronic kidney disease, stage 3 unspecified: Secondary | ICD-10-CM | POA: Diagnosis not present

## 2022-05-15 DIAGNOSIS — Z7189 Other specified counseling: Secondary | ICD-10-CM | POA: Diagnosis not present

## 2022-05-15 DIAGNOSIS — R471 Dysarthria and anarthria: Secondary | ICD-10-CM | POA: Diagnosis present

## 2022-05-15 DIAGNOSIS — Z818 Family history of other mental and behavioral disorders: Secondary | ICD-10-CM

## 2022-05-15 DIAGNOSIS — D649 Anemia, unspecified: Secondary | ICD-10-CM | POA: Diagnosis not present

## 2022-05-15 DIAGNOSIS — I612 Nontraumatic intracerebral hemorrhage in hemisphere, unspecified: Secondary | ICD-10-CM | POA: Diagnosis not present

## 2022-05-15 DIAGNOSIS — Z79899 Other long term (current) drug therapy: Secondary | ICD-10-CM | POA: Diagnosis not present

## 2022-05-15 DIAGNOSIS — N1832 Chronic kidney disease, stage 3b: Secondary | ICD-10-CM | POA: Diagnosis not present

## 2022-05-15 DIAGNOSIS — Z89422 Acquired absence of other left toe(s): Secondary | ICD-10-CM

## 2022-05-15 DIAGNOSIS — R29703 NIHSS score 3: Secondary | ICD-10-CM | POA: Diagnosis present

## 2022-05-15 DIAGNOSIS — R29810 Facial weakness: Secondary | ICD-10-CM | POA: Diagnosis present

## 2022-05-15 DIAGNOSIS — E440 Moderate protein-calorie malnutrition: Secondary | ICD-10-CM | POA: Diagnosis not present

## 2022-05-15 DIAGNOSIS — Z87891 Personal history of nicotine dependence: Secondary | ICD-10-CM | POA: Diagnosis not present

## 2022-05-15 DIAGNOSIS — N179 Acute kidney failure, unspecified: Secondary | ICD-10-CM | POA: Diagnosis not present

## 2022-05-15 DIAGNOSIS — R4781 Slurred speech: Secondary | ICD-10-CM | POA: Diagnosis not present

## 2022-05-15 DIAGNOSIS — I6523 Occlusion and stenosis of bilateral carotid arteries: Secondary | ICD-10-CM | POA: Diagnosis not present

## 2022-05-15 DIAGNOSIS — Z90710 Acquired absence of both cervix and uterus: Secondary | ICD-10-CM

## 2022-05-15 DIAGNOSIS — I739 Peripheral vascular disease, unspecified: Secondary | ICD-10-CM | POA: Diagnosis not present

## 2022-05-15 DIAGNOSIS — I63411 Cerebral infarction due to embolism of right middle cerebral artery: Secondary | ICD-10-CM | POA: Diagnosis not present

## 2022-05-15 DIAGNOSIS — R531 Weakness: Secondary | ICD-10-CM | POA: Diagnosis not present

## 2022-05-15 DIAGNOSIS — E860 Dehydration: Secondary | ICD-10-CM | POA: Diagnosis not present

## 2022-05-15 DIAGNOSIS — E785 Hyperlipidemia, unspecified: Secondary | ICD-10-CM | POA: Diagnosis present

## 2022-05-15 DIAGNOSIS — F419 Anxiety disorder, unspecified: Secondary | ICD-10-CM | POA: Diagnosis present

## 2022-05-15 DIAGNOSIS — I619 Nontraumatic intracerebral hemorrhage, unspecified: Secondary | ICD-10-CM | POA: Diagnosis not present

## 2022-05-15 DIAGNOSIS — Z8249 Family history of ischemic heart disease and other diseases of the circulatory system: Secondary | ICD-10-CM

## 2022-05-15 DIAGNOSIS — E1122 Type 2 diabetes mellitus with diabetic chronic kidney disease: Secondary | ICD-10-CM | POA: Diagnosis not present

## 2022-05-15 DIAGNOSIS — R627 Adult failure to thrive: Secondary | ICD-10-CM | POA: Diagnosis present

## 2022-05-15 DIAGNOSIS — Z833 Family history of diabetes mellitus: Secondary | ICD-10-CM

## 2022-05-15 DIAGNOSIS — I63231 Cerebral infarction due to unspecified occlusion or stenosis of right carotid arteries: Secondary | ICD-10-CM | POA: Diagnosis not present

## 2022-05-15 DIAGNOSIS — E7800 Pure hypercholesterolemia, unspecified: Secondary | ICD-10-CM | POA: Diagnosis not present

## 2022-05-15 DIAGNOSIS — Z515 Encounter for palliative care: Secondary | ICD-10-CM | POA: Diagnosis not present

## 2022-05-15 DIAGNOSIS — E118 Type 2 diabetes mellitus with unspecified complications: Secondary | ICD-10-CM | POA: Diagnosis not present

## 2022-05-15 DIAGNOSIS — E782 Mixed hyperlipidemia: Secondary | ICD-10-CM | POA: Diagnosis not present

## 2022-05-15 DIAGNOSIS — I63512 Cerebral infarction due to unspecified occlusion or stenosis of left middle cerebral artery: Secondary | ICD-10-CM | POA: Diagnosis not present

## 2022-05-15 DIAGNOSIS — G2581 Restless legs syndrome: Secondary | ICD-10-CM | POA: Diagnosis present

## 2022-05-15 DIAGNOSIS — R140 Abdominal distension (gaseous): Secondary | ICD-10-CM | POA: Diagnosis not present

## 2022-05-15 DIAGNOSIS — I609 Nontraumatic subarachnoid hemorrhage, unspecified: Secondary | ICD-10-CM | POA: Diagnosis not present

## 2022-05-15 DIAGNOSIS — E876 Hypokalemia: Secondary | ICD-10-CM | POA: Diagnosis not present

## 2022-05-15 DIAGNOSIS — N170 Acute kidney failure with tubular necrosis: Secondary | ICD-10-CM | POA: Diagnosis not present

## 2022-05-15 DIAGNOSIS — E872 Acidosis, unspecified: Secondary | ICD-10-CM | POA: Diagnosis not present

## 2022-05-15 DIAGNOSIS — I639 Cerebral infarction, unspecified: Secondary | ICD-10-CM | POA: Diagnosis present

## 2022-05-15 DIAGNOSIS — I700 Atherosclerosis of aorta: Secondary | ICD-10-CM | POA: Diagnosis present

## 2022-05-15 DIAGNOSIS — R2990 Unspecified symptoms and signs involving the nervous system: Secondary | ICD-10-CM

## 2022-05-15 DIAGNOSIS — R1310 Dysphagia, unspecified: Secondary | ICD-10-CM | POA: Diagnosis present

## 2022-05-15 DIAGNOSIS — R29818 Other symptoms and signs involving the nervous system: Secondary | ICD-10-CM | POA: Diagnosis not present

## 2022-05-15 DIAGNOSIS — E1151 Type 2 diabetes mellitus with diabetic peripheral angiopathy without gangrene: Secondary | ICD-10-CM | POA: Diagnosis present

## 2022-05-15 DIAGNOSIS — Z8619 Personal history of other infectious and parasitic diseases: Secondary | ICD-10-CM

## 2022-05-15 DIAGNOSIS — I6603 Occlusion and stenosis of bilateral middle cerebral arteries: Secondary | ICD-10-CM | POA: Diagnosis not present

## 2022-05-15 DIAGNOSIS — B370 Candidal stomatitis: Secondary | ICD-10-CM | POA: Diagnosis present

## 2022-05-15 DIAGNOSIS — Z9049 Acquired absence of other specified parts of digestive tract: Secondary | ICD-10-CM | POA: Diagnosis not present

## 2022-05-15 DIAGNOSIS — Z66 Do not resuscitate: Secondary | ICD-10-CM | POA: Diagnosis not present

## 2022-05-15 DIAGNOSIS — R4182 Altered mental status, unspecified: Secondary | ICD-10-CM | POA: Diagnosis not present

## 2022-05-15 DIAGNOSIS — I6503 Occlusion and stenosis of bilateral vertebral arteries: Secondary | ICD-10-CM | POA: Diagnosis not present

## 2022-05-15 DIAGNOSIS — R112 Nausea with vomiting, unspecified: Secondary | ICD-10-CM | POA: Diagnosis not present

## 2022-05-15 DIAGNOSIS — E1165 Type 2 diabetes mellitus with hyperglycemia: Secondary | ICD-10-CM | POA: Diagnosis not present

## 2022-05-15 DIAGNOSIS — K5792 Diverticulitis of intestine, part unspecified, without perforation or abscess without bleeding: Secondary | ICD-10-CM | POA: Diagnosis not present

## 2022-05-15 DIAGNOSIS — I611 Nontraumatic intracerebral hemorrhage in hemisphere, cortical: Secondary | ICD-10-CM | POA: Diagnosis not present

## 2022-05-15 DIAGNOSIS — I6389 Other cerebral infarction: Secondary | ICD-10-CM | POA: Diagnosis not present

## 2022-05-15 LAB — CBC
HCT: 34.5 % — ABNORMAL LOW (ref 36.0–46.0)
Hemoglobin: 11.2 g/dL — ABNORMAL LOW (ref 12.0–15.0)
MCH: 26.5 pg (ref 26.0–34.0)
MCHC: 32.5 g/dL (ref 30.0–36.0)
MCV: 81.8 fL (ref 80.0–100.0)
Platelets: 247 10*3/uL (ref 150–400)
RBC: 4.22 MIL/uL (ref 3.87–5.11)
RDW: 15.8 % — ABNORMAL HIGH (ref 11.5–15.5)
WBC: 10.2 10*3/uL (ref 4.0–10.5)
nRBC: 0 % (ref 0.0–0.2)

## 2022-05-15 LAB — I-STAT CHEM 8, ED
BUN: 39 mg/dL — ABNORMAL HIGH (ref 8–23)
Calcium, Ion: 1.18 mmol/L (ref 1.15–1.40)
Chloride: 109 mmol/L (ref 98–111)
Creatinine, Ser: 2.1 mg/dL — ABNORMAL HIGH (ref 0.44–1.00)
Glucose, Bld: 175 mg/dL — ABNORMAL HIGH (ref 70–99)
HCT: 35 % — ABNORMAL LOW (ref 36.0–46.0)
Hemoglobin: 11.9 g/dL — ABNORMAL LOW (ref 12.0–15.0)
Potassium: 5.6 mmol/L — ABNORMAL HIGH (ref 3.5–5.1)
Sodium: 136 mmol/L (ref 135–145)
TCO2: 21 mmol/L — ABNORMAL LOW (ref 22–32)

## 2022-05-15 LAB — COMPREHENSIVE METABOLIC PANEL
ALT: 14 U/L (ref 0–44)
AST: 22 U/L (ref 15–41)
Albumin: 3.6 g/dL (ref 3.5–5.0)
Alkaline Phosphatase: 79 U/L (ref 38–126)
Anion gap: 9 (ref 5–15)
BUN: 42 mg/dL — ABNORMAL HIGH (ref 8–23)
CO2: 19 mmol/L — ABNORMAL LOW (ref 22–32)
Calcium: 9.1 mg/dL (ref 8.9–10.3)
Chloride: 108 mmol/L (ref 98–111)
Creatinine, Ser: 2.1 mg/dL — ABNORMAL HIGH (ref 0.44–1.00)
GFR, Estimated: 24 mL/min — ABNORMAL LOW (ref >60)
Glucose, Bld: 182 mg/dL — ABNORMAL HIGH (ref 70–99)
Potassium: 5.3 mmol/L — ABNORMAL HIGH (ref 3.5–5.1)
Sodium: 136 mmol/L (ref 135–145)
Total Bilirubin: 0.8 mg/dL (ref 0.3–1.2)
Total Protein: 6.9 g/dL (ref 6.5–8.1)

## 2022-05-15 LAB — LIPID PANEL
Cholesterol: 324 mg/dL — ABNORMAL HIGH (ref 0–200)
HDL: 50 mg/dL (ref >40)
LDL Cholesterol: 254 mg/dL — ABNORMAL HIGH (ref 0–99)
Total CHOL/HDL Ratio: 6.5 RATIO
Triglycerides: 102 mg/dL (ref <150)
VLDL: 20 mg/dL (ref 0–40)

## 2022-05-15 LAB — DIFFERENTIAL
Abs Immature Granulocytes: 0.04 10*3/uL (ref 0.00–0.07)
Basophils Absolute: 0 10*3/uL (ref 0.0–0.1)
Basophils Relative: 0 %
Eosinophils Absolute: 0 10*3/uL (ref 0.0–0.5)
Eosinophils Relative: 0 %
Immature Granulocytes: 0 %
Lymphocytes Relative: 9 %
Lymphs Abs: 1 10*3/uL (ref 0.7–4.0)
Monocytes Absolute: 0.4 10*3/uL (ref 0.1–1.0)
Monocytes Relative: 4 %
Neutro Abs: 8.8 10*3/uL — ABNORMAL HIGH (ref 1.7–7.7)
Neutrophils Relative %: 87 %

## 2022-05-15 LAB — ETHANOL: Alcohol, Ethyl (B): <10 mg/dL (ref <10)

## 2022-05-15 LAB — PROTIME-INR
INR: 1.1 (ref 0.8–1.2)
Prothrombin Time: 14.2 seconds (ref 11.4–15.2)

## 2022-05-15 LAB — APTT: aPTT: 31 seconds (ref 24–36)

## 2022-05-15 LAB — CK: Total CK: 486 U/L — ABNORMAL HIGH (ref 38–234)

## 2022-05-15 LAB — CBG MONITORING, ED: Glucose-Capillary: 152 mg/dL — ABNORMAL HIGH (ref 70–99)

## 2022-05-15 MED ORDER — ACETAMINOPHEN 325 MG PO TABS
650.0000 mg | ORAL_TABLET | ORAL | Status: DC | PRN
  Administered 2022-05-18: 650 mg via ORAL
  Filled 2022-05-15 (×3): qty 2

## 2022-05-15 MED ORDER — INSULIN ASPART 100 UNIT/ML IJ SOLN: 0.0000 [IU] | Freq: Three times a day (TID) | INTRAMUSCULAR | Status: DC

## 2022-05-15 MED ORDER — AMLODIPINE BESYLATE 10 MG PO TABS
10.0000 mg | ORAL_TABLET | Freq: Every day | ORAL | Status: DC
  Administered 2022-05-16 – 2022-05-22 (×7): 10 mg via ORAL
  Filled 2022-05-15 (×9): qty 1

## 2022-05-15 MED ORDER — SERTRALINE HCL 50 MG PO TABS
25.0000 mg | ORAL_TABLET | Freq: Every day | ORAL | Status: DC
  Administered 2022-05-16 – 2022-05-31 (×8): 25 mg via ORAL
  Filled 2022-05-15 (×15): qty 1

## 2022-05-15 MED ORDER — ACETAMINOPHEN 650 MG RE SUPP
650.0000 mg | RECTAL | Status: DC | PRN
  Administered 2022-05-27 – 2022-05-29 (×5): 650 mg via RECTAL
  Filled 2022-05-15 (×5): qty 1

## 2022-05-15 MED ORDER — LABETALOL HCL 5 MG/ML IV SOLN
20.0000 mg | Freq: Once | INTRAVENOUS | Status: AC
  Administered 2022-05-15: 10 mg via INTRAVENOUS

## 2022-05-15 MED ORDER — CLEVIDIPINE BUTYRATE 0.5 MG/ML IV EMUL
0.0000 mg/h | INTRAVENOUS | Status: DC
  Administered 2022-05-15: 4.5 mg/h via INTRAVENOUS
  Administered 2022-05-15: 5 mg/h via INTRAVENOUS
  Administered 2022-05-15: 1 mg/h via INTRAVENOUS
  Administered 2022-05-16: 5 mg/h via INTRAVENOUS
  Administered 2022-05-16: 8 mg/h via INTRAVENOUS
  Administered 2022-05-16: 5 mg/h via INTRAVENOUS
  Administered 2022-05-16: 10 mg/h via INTRAVENOUS
  Administered 2022-05-16: 2 mg/h via INTRAVENOUS
  Administered 2022-05-16: 6 mg/h via INTRAVENOUS
  Administered 2022-05-17: 4 mg/h via INTRAVENOUS
  Filled 2022-05-15 (×6): qty 50
  Filled 2022-05-15 (×2): qty 100
  Filled 2022-05-15: qty 50

## 2022-05-15 MED ORDER — ENALAPRIL MALEATE 10 MG PO TABS
20.0000 mg | ORAL_TABLET | Freq: Every day | ORAL | Status: DC
  Administered 2022-05-16 – 2022-05-17 (×2): 20 mg via ORAL
  Filled 2022-05-15 (×3): qty 2

## 2022-05-15 MED ORDER — ACETAMINOPHEN 160 MG/5ML PO SOLN
650.0000 mg | ORAL | Status: DC | PRN
  Administered 2022-05-23 – 2022-05-26 (×2): 650 mg
  Filled 2022-05-15 (×2): qty 20.3

## 2022-05-15 MED ORDER — SODIUM CHLORIDE 0.9 % IV SOLN: INTRAVENOUS | Status: DC

## 2022-05-15 MED ORDER — CARVEDILOL 6.25 MG PO TABS
6.2500 mg | ORAL_TABLET | Freq: Every day | ORAL | Status: DC
  Administered 2022-05-16 – 2022-05-22 (×7): 6.25 mg via ORAL
  Filled 2022-05-15 (×5): qty 1
  Filled 2022-05-15 (×2): qty 2
  Filled 2022-05-15: qty 1
  Filled 2022-05-15: qty 2

## 2022-05-15 MED ORDER — SENNOSIDES-DOCUSATE SODIUM 8.6-50 MG PO TABS: 1.0000 | ORAL_TABLET | Freq: Two times a day (BID) | ORAL | Status: DC

## 2022-05-15 MED ORDER — IOHEXOL 350 MG/ML SOLN
75.0000 mL | Freq: Once | INTRAVENOUS | Status: AC | PRN
  Administered 2022-05-15: 75 mL via INTRAVENOUS

## 2022-05-15 MED ORDER — SODIUM CHLORIDE 0.9 % IV BOLUS
500.0000 mL | Freq: Once | INTRAVENOUS | Status: AC
  Administered 2022-05-15: 500 mL via INTRAVENOUS

## 2022-05-15 MED ORDER — PANTOPRAZOLE SODIUM 40 MG IV SOLR
40.0000 mg | Freq: Every day | INTRAVENOUS | Status: DC
  Administered 2022-05-15: 40 mg via INTRAVENOUS
  Filled 2022-05-15: qty 10

## 2022-05-15 MED ORDER — STROKE: EARLY STAGES OF RECOVERY BOOK
Freq: Once | Status: AC
  Filled 2022-05-15: qty 1

## 2022-05-15 NOTE — ED Triage Notes (Signed)
Patient accompanied w/ daughters w/ slurred speech, left sided facial droop, and visual deficits to the L eye. Patient last spoke to daughter last night at 6:30 pm. She fell last night, was laying on the ground for 9 hours per the daughter but was able to get up eventually. Complaining of back pain and left arm pain w/ bruising to the left arm.

## 2022-05-15 NOTE — ED Provider Notes (Signed)
[facility] EMERGENCY DEPARTMENT Provider Note   CSN: [id number] Arrival date & time: 05/15/22  1144     History  Chief Complaint  Patient presents with   Code Stroke    Lauren Craig is a 76 y.o. 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  Her family reports that they were with her on Friday, 2 days ago, she was behaving normally.  Last night the patient was complaining of weakness on the phone, but the patient denies that her speech was slurred.  She did have a fall around 11 PM and was unable to get up and spent the night on the ground.  This morning a family member checked on her and found that she had significantly slurred speech, complaining of visual loss and weakness on her left side.  The patient was found around 9 AM this morning.  Her family does report the patient was "nauseated and vomiting yesterday".  HPI     Home Medications Prior to Admission medications   Medication Sig Start Date End Date Taking? Authorizing Provider  acetaminophen (TYLENOL) 325 MG tablet Take 2 tablets (650 mg total) by mouth every 6 (six) hours as needed for headache. 11/01/20   Alcus Dad, MD  albuterol (VENTOLIN HFA) 108 (90 Base) MCG/ACT inhaler Inhale 2 puffs into the lungs every 6 (six) hours as needed for wheezing or shortness of breath. 01/29/21   Martyn Malay, MD  amLODipine (NORVASC) 10 MG tablet Take 1 tablet (10 mg total) by mouth daily. 07/05/21   Martyn Malay, MD  atorvastatin (LIPITOR) 80 MG tablet Take 1 tablet (80 mg total) by mouth at bedtime. 03/19/21   Richardo Priest, MD  carvedilol (COREG) 6.25 MG tablet Take 6.25 mg by mouth daily.    [provider]  clopidogrel (PLAVIX) 75 MG tablet Take 75 mg by mouth daily.    [provider]  enalapril (VASOTEC) 20 MG  tablet Take 1 tablet (20 mg total) by mouth at bedtime. 11/05/21   Martyn Malay, MD  ezetimibe (ZETIA) 10 MG tablet Take 1 tablet (10 mg total) by mouth daily. 03/19/21   Richardo Priest, MD  famotidine (PEPCID) 10 MG tablet Take 1 tablet (10 mg total) by mouth daily. 12/17/21   Martyn Malay, MD  fidaxomicin (DIFICID) 200 MG TABS tablet Take 1 tablet by mouth 2 times daily. 09/01/21   Willia Craze, NP  fluticasone (FLONASE) 50 MCG/ACT nasal spray Place 2 sprays into both nostrils daily.    [provider]  indapamide (LOZOL) 2.5 MG tablet Take 1 tablet (2.5 mg total) by mouth daily. 07/05/21   Martyn Malay, MD  nystatin cream (MYCOSTATIN) Apply 1 application topically 2 (two) times daily. 03/01/21   Martyn Malay, MD  ondansetron (ZOFRAN-ODT) 4 MG disintegrating tablet Take 1 tablet (4 mg total) by mouth every 8 (eight) hours as needed for nausea or vomiting. 04/05/22   Levin Erp, PA  OVER THE COUNTER MEDICATION Take 1 tablet by mouth daily. Prevagen - once daily    [provider]  pantoprazole (PROTONIX) 40 MG tablet Take 1 tablet (40 mg total) by mouth 2 (two) times daily, 30-60 minutes before breakfast and dinner. 04/05/22   Levin Erp, PA  promethazine (PHENERGAN) 25 MG tablet Take 1 tablet (25  mg total) by mouth every 6 (six) hours as needed for nausea or vomiting. As needed 06/07/21   Martyn Malay, MD  sertraline (ZOLOFT) 25 MG tablet Take 1 tablet (25 mg total) by mouth daily. 03/03/21   Martyn Malay, MD  sucralfate (CARAFATE) 1 g tablet Take 1 tablet (1 g total) by mouth daily before supper. 01/29/21   Martyn Malay, MD  triamcinolone ointment (KENALOG) 0.1 % Apply 1 application topically 2 (two) times daily. 03/01/21   Martyn Malay, MD      Allergies    Penicillins, Insulin lispro prot & lispro, Semaglutide, and Metformin    Review of Systems   Review of Systems  Physical Exam Updated Vital Signs BP (!) 151/51   Pulse 71   Temp  97.9 F (36.6 C) (Oral)   Resp 16   SpO2 98%  Physical Exam Constitutional:      General: She is not in acute distress. HENT:     Head: Normocephalic and atraumatic.  Eyes:     Conjunctiva/sclera: Conjunctivae normal.     Pupils: Pupils are equal, round, and reactive to light.  Cardiovascular:     Rate and Rhythm: Normal rate and regular rhythm.  Pulmonary:     Effort: Pulmonary effort is normal. No respiratory distress.  Abdominal:     General: There is no distension.     Tenderness: There is no abdominal tenderness.  Skin:    General: Skin is warm and dry.  Neurological:     Mental Status: She is alert.     Comments: Left-sided facial droop.  Significantly slurred speech.  No facial paresthesias or loss of sensation.  Visual field deficits with left lateral peripheral fields, no gaze deviation.  No pronator drift.  4 out of 5 strength noted in left upper and lower extremity.  5 out of 5 strength in the right upper and lower extremity  Psychiatric:        Mood and Affect: Mood normal.        Behavior: Behavior normal.     ED Results / Procedures / Treatments   Labs (all labs ordered are listed, but only abnormal results are displayed) Labs Reviewed  CBC - Abnormal; Notable for the following components:      Result Value   Hemoglobin 11.2 (*)    HCT 34.5 (*)    RDW 15.8 (*)    All other components within normal limits  DIFFERENTIAL - Abnormal; Notable for the following components:   Neutro Abs 8.8 (*)    All other components within normal limits  COMPREHENSIVE METABOLIC PANEL - Abnormal; Notable for the following components:   Potassium 5.3 (*)    CO2 19 (*)    Glucose, Bld 182 (*)    BUN 42 (*)    Creatinine, Ser 2.10 (*)    GFR, Estimated 24 (*)    All other components within normal limits  CK - Abnormal; Notable for the following components:   Total CK 486 (*)    All other components within normal limits  I-STAT CHEM 8, ED - Abnormal; Notable for the following  components:   Potassium 5.6 (*)    BUN 39 (*)    Creatinine, Ser 2.10 (*)    Glucose, Bld 175 (*)    TCO2 21 (*)    Hemoglobin 11.9 (*)    HCT 35.0 (*)    All other components within normal limits  CBG MONITORING, ED - Abnormal; Notable for  the following components:   Glucose-Capillary 152 (*)    All other components within normal limits  ETHANOL  PROTIME-INR  APTT  RAPID URINE DRUG SCREEN, HOSP PERFORMED  URINALYSIS, ROUTINE W REFLEX MICROSCOPIC  URINALYSIS, ROUTINE W REFLEX MICROSCOPIC  RAPID URINE DRUG SCREEN, HOSP PERFORMED  HEMOGLOBIN A1C  LIPID PANEL    EKG EKG Interpretation  Date/Time:  Sunday May 15 2022 12:01:21 EST Ventricular Rate:  82 PR Interval:  196 QRS Duration: 78 QT Interval:  358 QTC Calculation: 418 R Axis:   -45 Text Interpretation: Normal sinus rhythm Left axis deviation Abnormal ECG When compared with ECG of 31-Aug-2021 20:51, PREVIOUS ECG IS PRESENT Confirmed by Octaviano Glow [phone]) on 05/15/2022 2:17:03 PM  Radiology CT ANGIO HEAD NECK W WO CM (CODE STROKE)  Result Date: 05/15/2022 CLINICAL DATA:  Neuro deficit, acute, stroke suspected. Slurred speech and left-sided facial droop. Patient fell last night and was down for proximally 9 hours. EXAM: CT ANGIOGRAPHY HEAD AND NECK TECHNIQUE: Multidetector CT imaging of the head and neck was performed using the standard protocol during bolus administration of intravenous contrast. Multiplanar CT image reconstructions and MIPs were obtained to evaluate the vascular anatomy. Carotid stenosis measurements (when applicable) are obtained utilizing NASCET criteria, using the distal internal carotid diameter as the denominator. RADIATION DOSE REDUCTION: This exam was performed according to the departmental dose-optimization program which includes automated exposure control, adjustment of the mA and/or kV according to patient size and/or use of iterative reconstruction technique. CONTRAST:  46mL OMNIPAQUE  IOHEXOL 350 MG/ML SOLN COMPARISON:  CT head without contrast 05/15/2022 FINDINGS: CTA NECK FINDINGS Aortic arch: Atherosclerotic calcifications are present at the origins the great vessels without focal stenosis or aneurysm. The left vertebral artery originates directly from the arch, a normal variant. Right carotid system: The right common carotid artery is within normal limits. Calcifications are present the bifurcation without a significant stenosis. Cervical right ICA is within normal limits. Moderate patient motion is present just above the level of the scratched at moderate patient motion is present in the neck. Left carotid system: The left common carotid artery is within normal limits. Atherosclerotic changes are present at the bifurcation without a significant stenosis. The cervical left ICA is otherwise within normal limits. Vertebral arteries: The right vertebral artery is noted from the subclavian artery. A high-grade near occlusive stenosis is present at the origin of the right vertebral artery. The left vertebral artery is dominant. No other focal stenoses are present in either vertebral artery in the neck. Skeleton: Vertebral body heights are normal. 3 mm degenerative anterolisthesis present C4-5. Straightening of the normal cervical lordosis is present. No focal osseous lesions are present. Other neck: Soft tissues the neck are otherwise unremarkable. Salivary glands are within normal limits. Thyroid is normal. No significant adenopathy is present. No focal mucosal or submucosal lesions are present. Upper chest: Lung apices are clear. The thoracic inlet is within normal limits. Review of the MIP images confirms the above findings CTA HEAD FINDINGS Anterior circulation: Atherosclerotic calcifications are present within the cavernous internal carotid arteries bilaterally. A 50% stenosis is present within the cavernous right ICA. No other significant stenosis is present relative to the more distal  vessel. The ICA termini are within normal limits bilaterally. The A1 and M1 segments are normal. The anterior communicating artery is patent. Mild narrowing is again seen within the distal M1 segments bilaterally. MCA bifurcations are intact. Segmental narrowing is again seen throughout ACA and MCA branch vessels without a significant  proximal stenosis or occlusion. Posterior circulation: The left vertebral artery is the dominant vessel. A high-grade stenosis is present distal right V4 segment. Vertebrobasilar junction and basilar artery normal. Left posterior cerebral artery originates from basilar tip. The right posterior cerebral artery is of fetal type. Moderate P2 segment stenoses are present bilaterally, as previously seen. Venous sinuses: The dural sinuses are patent. The straight sinus and deep cerebral veins are intact. Cortical veins are within normal limits. No significant vascular malformation is evident. Anatomic variants: Fetal type right posterior cerebral artery. Review of the MIP images confirms the above findings IMPRESSION: 1. No emergent large vessel occlusion. 2. 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 3. 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 4. Moderate P2 segment stenoses bilaterally, as previously seen. 5. Mild narrowing of the distal M1 segments bilaterally. 6. Segmental narrowing branch vessels of the circle-of-Willis without a proximal stenosis or occlusion. Findings are similar the prior MRI, consistent with diffuse intracranial atherosclerosis. 7. Atherosclerotic changes at the carotid bifurcations and cavernous internal carotid arteries bilaterally without significant stenosis. 8.  Aortic Atherosclerosis (ICD10-I70.0). Electronically Signed   By: San Morelle M.D.   On: 05/15/2022 13:11   CT HEAD CODE STROKE WO CONTRAST  Result Date: 05/15/2022 CLINICAL DATA:  Code stroke. 76 year old female. History of chronic infarcts.  Intracranial atherosclerosis. 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 Presenting with slurred speech, left-sided facial droop. Fell last night, was down for about 9 hours. EXAM: CT HEAD WITHOUT CONTRAST TECHNIQUE: Contiguous axial images were obtained from the base of the skull through the vertex without intravenous contrast. RADIATION DOSE REDUCTION: This exam was performed according to the departmental dose-optimization program which includes automated exposure control, adjustment of the mA and/or kV according to patient size and/or use of iterative reconstruction technique. COMPARISON:  Head CT and brain MRI 12/17/2020, and earlier. FINDINGS: Brain: Small volume subarachnoid hemorrhage at the right vertex, single central or precentral sulcus involvement on series 2, image 25. No intraventricular hemorrhage. No ventriculomegaly. Stable cerebral volume. No midline shift, mass effect, or evidence of intracranial mass lesion. Patchy and confluent bilateral cerebral white matter hypodensity. Chronic appearing encephalomalacia in the left corona radiata and lentiform. No cortically based acute infarct identified. Vascular: Extensive Calcified atherosclerosis at the skull base. No suspicious intracranial vascular hyperdensity. Skull: No acute osseous abnormality identified. Sinuses/Orbits: Visualized paranasal sinuses and mastoids are stable and well aerated. Other: No orbit or scalp soft tissue injury identified. ASPECTS [facility] Stroke Program Early CT Score) Total score (0-10 with 10 being normal): 10, although also positive for trace subarachnoid hemorrhage. IMPRESSION: 1. Trace subarachnoid hemorrhage at the right vertex (single sulcus involvement). This is likely posttraumatic given history of fall and found down. 2. No superimposed acute cortically based infarct identified. 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 3. These results were communicated to Dr. Cheral Marker at 12:40 pm on  05/15/2022 by text page via the [facility] messaging system. Electronically Signed   By: Genevie Ann M.D.   On: 05/15/2022 12:41    Procedures Procedures    Medications Ordered in ED Medications  clevidipine (CLEVIPREX) infusion 0.5 mg/mL (3 mg/hr Intravenous Infusion Verify 05/15/22 1331)   stroke: early stages of recovery book (has no administration in time range)  acetaminophen (TYLENOL) tablet 650 mg (has no administration in time range)    Or  acetaminophen (TYLENOL) 160 MG/5ML solution 650 mg (has no administration in time range)    Or  acetaminophen (TYLENOL) suppository 650 mg (has no administration in time range)  senna-docusate (Senokot-S) tablet 1 tablet (has no administration in time range)  pantoprazole (PROTONIX) injection 40 mg (has no administration in time range)  0.9 %  sodium chloride infusion (has no administration in time range)  labetalol (NORMODYNE) injection 20 mg (10 mg Intravenous Given 05/15/22 1245)  iohexol (OMNIPAQUE) 350 MG/ML injection 75 mL (75 mLs Intravenous Contrast Given 05/15/22 1255)  sodium chloride 0.9 % bolus 500 mL (500 mLs Intravenous New Bag/Given 05/15/22 1339)    ED Course/ Medical Decision Making/ A&P Clinical Course as of 05/15/22 1417  Sun May 15, 2022  1254 +hemorrhagic lesion on CT.  Pt to be admitted to neurology service for further workup.  TNK is not indicated in this setting. [MT]    Clinical Course User Index [MT] Kiante Ciavarella, Carola Rhine, MD                           Medical Decision Making Amount and/or Complexity of Data Reviewed Labs: ordered.  Risk Decision regarding hospitalization.   This patient presents to the ED with concern for slurred speech, visual deficits, left weakness. This involves an extensive number of treatment options, and is a complaint that carries with it a high risk of complications and morbidity.  The differential diagnosis includes CVA - meeting VAN criteria - CODE stroke activated.    The timeline and  exact onset of her symptoms remains uncertain.  Initially the patient had reported to me that she felt she got sick yesterday evening late at night--prompting my LVO code stroke activation--but upon further questioning by myself and the neurology team, it appears that she may have developed symptoms much earlier than that, she was noted to have difficult speech and facial drooping, and drooling by her family earlier yesterday afternoon.  Co-morbidities that complicate the patient evaluation: Prior strokes, elevated cholesterol  Additional history obtained from patient's family, daughters at the bedside  External records from outside source obtained and reviewed including July 2022 stroke evaluation  I ordered and personally interpreted labs.  The pertinent results include: CK very mildly elevated.  CMP shows some prerenal AKI, mild.  I ordered imaging studies including CT imaging of the brain, CT angiogram I independently visualized and interpreted imaging which showed trace subarachnoid hemorrhage, high-grade vertebral stenosis I agree with the radiologist interpretation  The patient was maintained on a cardiac monitor.  I personally viewed and interpreted the cardiac monitored which showed an underlying rhythm of: Sinus rhythm  Per my interpretation the patient's ECG shows no significant changes from prior EKG, no acute ischemic findings, normal sinus rhythm  In discussion with neurology consultants, patient is not a candidate for thrombolytic therapy at this time.   After the interventions noted above, I reevaluated the patient and found that they have: stayed the same   Dispostion:  After consideration of the diagnostic results and the patients response to treatment, I feel that the patent would benefit from medical admission         Final Clinical Impression(s) / ED Diagnoses Final diagnoses:  Brain bleed ([place])  Stroke-like symptoms    Rx / DC Orders ED Discharge Orders      None         Wyvonnia Dusky, MD 05/15/22 1417

## 2022-05-15 NOTE — Progress Notes (Signed)
  Transition of Care [facility]) Screening Note   Patient Details  Name: Lauren Craig Date of Birth: Dec 31, 1945   Transition of Care [facility]) CM/SW Contact:    Bartholomew Crews, RN Phone Number: [phone] 05/15/2022, 2:04 PM    Transition of Care Department [facility]) has reviewed patient and no TOC needs have been identified at this time. We will continue to monitor patient advancement through interdisciplinary progression rounds. If new patient transition needs arise, please place a TOC consult.

## 2022-05-15 NOTE — H&P (Signed)
Neurology H&P  CC: Code Stroke  History is obtained from:patient and daughter  HPI: Lauren Craig is a 76 y.o. 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 She presents to the ED c/o slurred speech and weakness.  Family states that patient was last seen well on Friday when they were with her shopping. They were unable to get in touch with her yesterday and went to check on her. They said she would only crack the door open and talk to them from behind the door. They noticed that she had vomit on her mouth and seemed unsteady. They tried to talk her into going to the ED, but she refused. Sometime after that, they were on the phone with her and said that her speech seemed slurred, but thought it might just be because her dentures were not in. Sometime during the night, the patient fell and could not get up. This morning, the patients granddaughter found her on the ground, moved her into the chair. The patient refused to come to the hospital via EMS, so the family brought her in.  On admission to the ED, patient was hypertensive with SBP in the 220s.  Code stroke was initiated in the ED after patient c/o weakness and exhibited slurred speech.  On assessment in CT, patient continued to exhibit dysarthria, but was able to move all extremities equally. She is oriented to self, place, time and situation. Left facial droop and decreased left upper visual field was noted.  CTH and CTA were performed.  Patients BP remained elevated in the 220s. a cleviprex bolus and subsequent gtt was started   LKW: Friday  tpa given?: No, ICH IR Thrombectomy? No, ICH Modified Rankin Scale: 0-Completely asymptomatic and back to baseline post- stroke NIHSS: 3   ROS: A complete ROS was performed and is negative except as noted in the HPI.    Past Medical History:  Diagnosis Date   Anxiety disorder    Arthritis     hands, hips   C. 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 Her smoking use included cigarettes. She smoked an average of 1 pack per day. She has never used smokeless tobacco. She reports that she does not currently use alcohol after a past usage of about 1.0 standard drink of alcohol per week. She reports that she does not use drugs.   Prior to Admission medications   Medication Sig Start Date End Date Taking? Authorizing Provider  acetaminophen (TYLENOL) 325 MG tablet Take 2 tablets (650 mg total) by mouth every 6 (six) hours as needed for headache. 11/01/20   Alcus Dad, MD  albuterol (VENTOLIN HFA) 108 (90 Base) MCG/ACT inhaler Inhale 2 puffs into  the lungs every 6 (six) hours as needed for wheezing or shortness of breath. 01/29/21   Martyn Malay, MD  amLODipine (NORVASC) 10 MG tablet Take 1 tablet (10 mg total) by mouth daily. 07/05/21   Martyn Malay, MD  atorvastatin (LIPITOR) 80 MG tablet Take 1 tablet (80 mg total) by mouth at bedtime. 03/19/21   Richardo Priest, MD  carvedilol (COREG) 6.25 MG tablet Take 6.25 mg by mouth daily.    [provider]  clopidogrel (PLAVIX) 75 MG tablet Take 75 mg by mouth daily.    [provider]  enalapril (VASOTEC) 20 MG tablet Take 1 tablet (20 mg total) by mouth at bedtime. 11/05/21   Martyn Malay, MD  ezetimibe (ZETIA) 10 MG tablet Take 1 tablet  (10 mg total) by mouth daily. 03/19/21   Richardo Priest, MD  famotidine (PEPCID) 10 MG tablet Take 1 tablet (10 mg total) by mouth daily. 12/17/21   Martyn Malay, MD  fidaxomicin (DIFICID) 200 MG TABS tablet Take 1 tablet by mouth 2 times daily. 09/01/21   Willia Craze, NP  fluticasone (FLONASE) 50 MCG/ACT nasal spray Place 2 sprays into both nostrils daily.    [provider]  indapamide (LOZOL) 2.5 MG tablet Take 1 tablet (2.5 mg total) by mouth daily. 07/05/21   Martyn Malay, MD  nystatin cream (MYCOSTATIN) Apply 1 application topically 2 (two) times daily. 03/01/21   Martyn Malay, MD  ondansetron (ZOFRAN-ODT) 4 MG disintegrating tablet Take 1 tablet (4 mg total) by mouth every 8 (eight) hours as needed for nausea or vomiting. 04/05/22   Levin Erp, PA  OVER THE COUNTER MEDICATION Take 1 tablet by mouth daily. Prevagen - once daily    [provider]  pantoprazole (PROTONIX) 40 MG tablet Take 1 tablet (40 mg total) by mouth 2 (two) times daily, 30-60 minutes before breakfast and dinner. 04/05/22   Levin Erp, PA  promethazine (PHENERGAN) 25 MG tablet Take 1 tablet (25 mg total) by mouth every 6 (six) hours as needed for nausea or vomiting. As needed 06/07/21   Martyn Malay, MD  sertraline (ZOLOFT) 25 MG tablet Take 1 tablet (25 mg total) by mouth daily. 03/03/21   Martyn Malay, MD  sucralfate (CARAFATE) 1 g tablet Take 1 tablet (1 g total) by mouth daily before supper. 01/29/21   Martyn Malay, MD  triamcinolone ointment (KENALOG) 0.1 % Apply 1 application topically 2 (two) times daily. 03/01/21   Martyn Malay, MD     Exam: Current vital signs: BP (!) 151/51   Pulse 71   Temp 99.3 F (37.4 C) (Oral)   Resp 16   SpO2 98%    Physical Exam  Constitutional: Appears well-developed and well-nourished.  Psych: Affect appropriate to situation Eyes: No scleral injection HENT: No OP obstrucion Head: Normocephalic.  Cardiovascular: Normal  rate and regular rhythm.  Respiratory: Unlabored breathing pattern.  GI: Soft.  No distension. There is no tenderness.  Skin: Small abrasion on left arm, elbow area.   Neuro: Mental Status: Patient is awake, alert, oriented to person, place, month, year, and situation. Patient is able to give a clear and coherent history. No signs of aphasia or neglect. Speech: Dysarthria present. Able to name objects and repeat.  Cranial Nerves: II: Pupils are equal, round, and reactive to light. Decreased left upper field of vision III,IV, VI: EOMI without ptosis or diplopia V: Facial sensation is symmetric to  light touch VII: L facial droop VIII: Hearing is intact to voice X: Palate is midline and palate elevates symmetrically XI: Shoulder shrug is symmetric. XII: tongue protrusion midline Motor: Tone is normal. Bulk is normal. 5/5 strength was present in all four extremities.  Sensory: Sensation is symmetric to light touch bilaterally in the arms and legs.  Cerebellar: FNF intact bilaterally  No current facility-administered medications on file prior to encounter.   Current Outpatient Medications on File Prior to Encounter  Medication Sig Dispense Refill   acetaminophen (TYLENOL) 325 MG tablet Take 2 tablets (650 mg total) by mouth every 6 (six) hours as needed for headache.     albuterol (VENTOLIN HFA) 108 (90 Base) MCG/ACT inhaler Inhale 2 puffs into the lungs every 6 (six) hours as needed for wheezing or shortness of breath. 18 g 3   amLODipine (NORVASC) 10 MG tablet Take 1 tablet (10 mg total) by mouth daily. 90 tablet 3   atorvastatin (LIPITOR) 80 MG tablet Take 1 tablet (80 mg total) by mouth at bedtime. 90 tablet 3   carvedilol (COREG) 6.25 MG tablet Take 6.25 mg by mouth daily.     clopidogrel (PLAVIX) 75 MG tablet Take 75 mg by mouth daily.     enalapril (VASOTEC) 20 MG tablet Take 1 tablet (20 mg total) by mouth at bedtime. 90 tablet 3   ezetimibe (ZETIA) 10 MG tablet Take 1 tablet  (10 mg total) by mouth daily. 90 tablet 3   famotidine (PEPCID) 10 MG tablet Take 1 tablet (10 mg total) by mouth daily. 90 tablet 3   fidaxomicin (DIFICID) 200 MG TABS tablet Take 1 tablet by mouth 2 times daily. 20 tablet 0   fluticasone (FLONASE) 50 MCG/ACT nasal spray Place 2 sprays into both nostrils daily.     indapamide (LOZOL) 2.5 MG tablet Take 1 tablet (2.5 mg total) by mouth daily. 90 tablet 3   nystatin cream (MYCOSTATIN) Apply 1 application topically 2 (two) times daily. 90 g 1   ondansetron (ZOFRAN-ODT) 4 MG disintegrating tablet Take 1 tablet (4 mg total) by mouth every 8 (eight) hours as needed for nausea or vomiting. 60 tablet 11   OVER THE COUNTER MEDICATION Take 1 tablet by mouth daily. Prevagen - once daily     pantoprazole (PROTONIX) 40 MG tablet Take 1 tablet (40 mg total) by mouth 2 (two) times daily, 30-60 minutes before breakfast and dinner. 60 tablet 11   promethazine (PHENERGAN) 25 MG tablet Take 1 tablet (25 mg total) by mouth every 6 (six) hours as needed for nausea or vomiting. As needed 30 tablet 0   sertraline (ZOLOFT) 25 MG tablet Take 1 tablet (25 mg total) by mouth daily. 90 tablet 3   sucralfate (CARAFATE) 1 g tablet Take 1 tablet (1 g total) by mouth daily before supper. 90 tablet 3   triamcinolone ointment (KENALOG) 0.1 % Apply 1 application topically 2 (two) times daily. 80 g 1      Assessment: Lauren Craig is a 76 y.o. 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 She presents to the ED c/o slurred speech and weakness. CT shows a trace subarachnoid hemorrhage.  - Exam reveals dysarthria and left facial droop.  - CT head: Trace subarachnoid hemorrhage at the right vertex (single sulcus involvement). This may be posttraumatic given history of fall and found down, but also could be secondary to amyloid angiopathy.  - CTA  of head and neck:  No emergent large  vessel occlusion. 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 Moderate P2 segment stenoses bilaterally, as previously seen. Mild narrowing of the distal M1 segments bilaterally. Segmental narrowing branch vessels of the circle[place] without a proximal stenosis or occlusion. Findings are similar to the prior MRI, consistent with diffuse intracranial atherosclerosis. Atherosclerotic changes at the carotid bifurcations and cavernous internal carotid arteries bilaterally without significant stenosis. No superimposed acute cortically based infarct identified. 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  Impression: Subarachnoid Hemorrhage Traumatic vs Other etiology Hypertensive Urgency Dehydration Hyperglycemia  Recommendations: Admit to ICU Frequent neuro checks SBP goal 130-150 Cleviprex gtt HOLD home Plavix SCDs for VTE prevention MRI to evaluate bleed post-24h PT/OT/SLT A1c, Lipid Panel for stroke risk factor stratification Carb Control diet once patient has passed swallow screen Accu-Checks ACHS once eating, Q6h until.  SSI MRI brain to evaluate for possible amyloid angiopathy Stroke Team to follow tomorrow morning   Pt seen by NP/Neuro and by MD.  Otelia Santee, DNP, AGACNP-BC [facility] See AMION for pager info and EPIC for messaging  I have seen and examined the patient. I have formulated the assessment and recommendations. 76 year old female presenting with small single-sulcus subarachnoid hemorrhage. Exam reveals dysarthria and left facial droop. Recommendations as above.  Electronically signed: Dr. Kerney Elbe

## 2022-05-15 NOTE — Plan of Care (Signed)
   Advanced Care Planning Note   Discussion between patient regarding code status. Witnesses included Daughter [name], Beulah Gandy, NP, RN Lewis Shock.   Patient is alert and oriented, neurologically able to make decision regarding her health.   No advance directive is on file. Patient has a previously recorded DNR status alert on her chart.  Full code, code with limitations and DNR status option explained to patient and family. Patient states verbally that she does not want CPR or to be intubated if the need arises, and would just want to be made comfortable. This was repeated back to her and she verbally agreed. Daughter at bedside was in agreement also. Confirmed with patient and daughter that these orders would be placed and confirmed that all their questions had been answered regarding code status.   DNR ordered per patient's wishes.   20 minutes were spent discussing advance care planning.    Otelia Santee, DNP, AGACNP-BC [facility].  Please see AMION for pager or EPIC for messaging.

## 2022-05-16 ENCOUNTER — Inpatient Hospital Stay (HOSPITAL_COMMUNITY): Payer: PPO

## 2022-05-16 DIAGNOSIS — I6389 Other cerebral infarction: Secondary | ICD-10-CM | POA: Diagnosis not present

## 2022-05-16 DIAGNOSIS — I63411 Cerebral infarction due to embolism of right middle cerebral artery: Secondary | ICD-10-CM | POA: Diagnosis not present

## 2022-05-16 DIAGNOSIS — R4182 Altered mental status, unspecified: Secondary | ICD-10-CM

## 2022-05-16 LAB — BASIC METABOLIC PANEL
Anion gap: 9 (ref 5–15)
BUN: 43 mg/dL — ABNORMAL HIGH (ref 8–23)
CO2: 16 mmol/L — ABNORMAL LOW (ref 22–32)
Calcium: 8.4 mg/dL — ABNORMAL LOW (ref 8.9–10.3)
Chloride: 112 mmol/L — ABNORMAL HIGH (ref 98–111)
Creatinine, Ser: 2.26 mg/dL — ABNORMAL HIGH (ref 0.44–1.00)
GFR, Estimated: 22 mL/min — ABNORMAL LOW (ref >60)
Glucose, Bld: 103 mg/dL — ABNORMAL HIGH (ref 70–99)
Potassium: 5 mmol/L (ref 3.5–5.1)
Sodium: 137 mmol/L (ref 135–145)

## 2022-05-16 LAB — ECHOCARDIOGRAM COMPLETE
AR max vel: 1.58 cm2
AV Area VTI: 2.08 cm2
AV Area mean vel: 1.69 cm2
AV Mean grad: 8.4 mmHg
AV Peak grad: 15.7 mmHg
Ao pk vel: 1.98 m/s
Area-P 1/2: 3.72 cm2
Calc EF: 64.4 %
S' Lateral: 2.6 cm
Single Plane A2C EF: 67.8 %
Single Plane A4C EF: 58.6 %

## 2022-05-16 LAB — CBC
HCT: 30.8 % — ABNORMAL LOW (ref 36.0–46.0)
Hemoglobin: 10.1 g/dL — ABNORMAL LOW (ref 12.0–15.0)
MCH: 26.9 pg (ref 26.0–34.0)
MCHC: 32.8 g/dL (ref 30.0–36.0)
MCV: 81.9 fL (ref 80.0–100.0)
Platelets: 213 10*3/uL (ref 150–400)
RBC: 3.76 MIL/uL — ABNORMAL LOW (ref 3.87–5.11)
RDW: 15.9 % — ABNORMAL HIGH (ref 11.5–15.5)
WBC: 6.8 10*3/uL (ref 4.0–10.5)
nRBC: 0 % (ref 0.0–0.2)

## 2022-05-16 LAB — HEMOGLOBIN A1C
Hgb A1c MFr Bld: 6.4 % — ABNORMAL HIGH (ref 4.8–5.6)
Mean Plasma Glucose: 137 mg/dL

## 2022-05-16 LAB — GLUCOSE, CAPILLARY
Glucose-Capillary: 133 mg/dL — ABNORMAL HIGH (ref 70–99)
Glucose-Capillary: 129 mg/dL — ABNORMAL HIGH (ref 70–99)
Glucose-Capillary: 133 mg/dL — ABNORMAL HIGH (ref 70–99)

## 2022-05-16 LAB — MAGNESIUM: Magnesium: 1.8 mg/dL (ref 1.7–2.4)

## 2022-05-16 MED ORDER — LOPERAMIDE HCL 2 MG PO CAPS
2.0000 mg | ORAL_CAPSULE | ORAL | Status: DC | PRN
  Administered 2022-05-16 – 2022-05-21 (×3): 2 mg via ORAL
  Filled 2022-05-16 (×4): qty 1

## 2022-05-16 MED ORDER — ONDANSETRON HCL 4 MG/2ML IJ SOLN
4.0000 mg | Freq: Four times a day (QID) | INTRAMUSCULAR | Status: DC | PRN
  Administered 2022-05-16 – 2022-05-23 (×6): 4 mg via INTRAVENOUS
  Filled 2022-05-16 (×7): qty 2

## 2022-05-16 MED ORDER — PANTOPRAZOLE SODIUM 40 MG PO TBEC
40.0000 mg | DELAYED_RELEASE_TABLET | Freq: Every day | ORAL | Status: DC
  Administered 2022-05-16 – 2022-05-21 (×6): 40 mg via ORAL
  Filled 2022-05-16 (×6): qty 1

## 2022-05-16 MED ORDER — ROSUVASTATIN CALCIUM 20 MG PO TABS
40.0000 mg | ORAL_TABLET | Freq: Every day | ORAL | Status: DC
  Administered 2022-05-16 – 2022-05-31 (×9): 40 mg via ORAL
  Filled 2022-05-16 (×15): qty 2

## 2022-05-16 MED ORDER — CHLORHEXIDINE GLUCONATE CLOTH 2 % EX PADS
6.0000 | MEDICATED_PAD | Freq: Every day | CUTANEOUS | Status: DC
  Administered 2022-05-16 – 2022-05-19 (×4): 6 via TOPICAL

## 2022-05-16 MED ORDER — EZETIMIBE 10 MG PO TABS
10.0000 mg | ORAL_TABLET | Freq: Every day | ORAL | Status: DC
  Administered 2022-05-16 – 2022-05-31 (×9): 10 mg via ORAL
  Filled 2022-05-16 (×15): qty 1

## 2022-05-16 NOTE — Progress Notes (Signed)
Modified Barium Swallow Progress Note  Patient Details  Name: Lauren Craig MRN: [id number] Date of Birth: March 15, 1946  Today's Date: 05/16/2022  Modified Barium Swallow completed.  Full report located under Chart Review in the Imaging Section.  Brief recommendations include the following:  Clinical Impression  Pt has mild oropharyngeal dysphagia noted during this MBS, although with limited ability to challenge her fully. Despite cues to take large, consecutive boluses, she takes very small and single bites/sips at a time due to nausea. When prompted to take as big of a sip as possible, she does start to have penetration of thin liquids due to impaired timing and coordination. Penetrates clear spontaneously (PAS 2), although there may still be concern for aspiration correlated with coughing events noted at bedside when pt was drinking more impulsively. [name] oral phase is slow but also without the anterior loss observed with larger bites/sips at bedside. Recommend starting with Dys 3 diet and thin liquids, avoiding mixed consistencies. Would encourage smaller sips as well as performance on MBS suggests adequate function to handle at least smaller volumes.   Swallow Evaluation Recommendations       SLP Diet Recommendations: Dysphagia 3 (Mech soft) solids;Thin liquid;Other (Comment) (avoid mixed consistencies)   Liquid Administration via: Cup;Straw   Medication Administration: Whole meds with puree   Supervision: Patient able to self feed;Intermittent supervision to cue for compensatory strategies;Comment (set up assist)   Compensations: Slow rate;Small sips/bites   Postural Changes: Seated upright at 90 degrees   Oral Care Recommendations: Oral care BID        Osie Bond., M.A. [place] Office [phone]  Secure chat preferred  05/16/2022,1:55 PM

## 2022-05-16 NOTE — Evaluation (Addendum)
Physical Therapy Evaluation Patient Details Name: Lauren Craig MRN: [id number] DOB: [birth year] Today's Date: 05/16/2022  History of Present Illness  Pt is a 76 y/o female presenting to the ED 12/17 with slurred speech and weakness, left facial droop and decreased vision L upper visual field.  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  PMHx  anxiety d/o, HTN, osteomyelities L 5th toe, stroke, DM2  Clinical Impression  Pt admitted with/for s/s of stroke.  Pt mobilizing at a min assist level, limited to transfer to BSC/recliner today..  Pt currently limited functionally due to the problems listed below.  (see problems list.)  Pt will benefit from PT to maximize function and safety to be able to get home safely with available assist .         Recommendations for follow up therapy are one component of a multi-disciplinary discharge planning process, led by the attending physician.  Recommendations may be updated based on patient status, additional functional criteria and insurance authorization.  Follow Up Recommendations Home health PT      Assistance Recommended at Discharge Intermittent Supervision/Assistance  Patient can return home with the following  A little help with walking and/or transfers;A little help with bathing/dressing/bathroom;Assistance with cooking/housework;Assist for transportation;Help with stairs or ramp for entrance    Equipment Recommendations Other (comment) (TBD)  Recommendations for Other Services       Functional Status Assessment Patient has had a recent decline in their functional status and demonstrates the ability to make significant improvements in function in a reasonable and predictable amount of time.     Precautions / Restrictions Precautions Precautions: Fall      Mobility  Bed Mobility Overal bed mobility: Needs Assistance Bed Mobility: Supine to  Sit     Supine to sit: Min assist     General bed mobility comments: cues for sequencing, no assist for LE's, truncal assist up and forward minimally.  No assist needed for scooting to EOB.    Transfers Overall transfer level: Needs assistance   Transfers: Sit to/from Stand, Bed to chair/wheelchair/BSC Sit to Stand: Min assist   Step pivot transfers: Min assist, +2 safety/equipment       General transfer comment: face to face assist to stand and to step over to the [facility] and BSC to the chair.    Ambulation/Gait               General Gait Details: pivotal steps only  Stairs            Wheelchair Mobility    Modified Rankin (Stroke Patients Only) Modified Rankin (Stroke Patients Only) Pre-Morbid Rankin Score: No symptoms Modified Rankin: Moderately severe disability     Balance Overall balance assessment: Needs assistance Sitting-balance support: Single extremity supported, Feet supported Sitting balance-Leahy Scale: Fair     Standing balance support: Bilateral upper extremity supported, During functional activity Standing balance-Leahy Scale: Poor Standing balance comment: reliant on external support                             Pertinent Vitals/Pain Pain Assessment Pain Assessment: Faces Faces Pain Scale: No hurt    Home Living Family/patient expects to be discharged to:: Private residence Living Arrangements: Other relatives Available Help at Discharge: Family;Available 24 hours/day Type of Home: House Home Access: Stairs to enter   [employer] of Steps: 1  Home Layout: Two level;Able to live on main level with bedroom/bathroom Home Equipment: Shower seat - built in;Wheelchair - manual;Cane - single point;Rolling Environmental consultant (2 wheels)      Prior Function Prior Level of Function : Independent/Modified Independent                     Hand Dominance   Dominant Hand: Right    Extremity/Trunk Assessment   Upper  Extremity Assessment Upper Extremity Assessment: Defer to OT evaluation    Lower Extremity Assessment Lower Extremity Assessment: Generalized weakness;Overall WFL for tasks assessed (L weaker than R LE)       Communication   Communication: Expressive difficulties  Cognition Arousal/Alertness: Awake/alert Behavior During Therapy: WFL for tasks assessed/performed Overall Cognitive Status: Impaired/Different from baseline (NT formally)                                          General Comments General comments (skin integrity, edema, etc.): vss    Exercises     Assessment/Plan    PT Assessment Patient needs continued PT services  PT Problem List Decreased strength;Decreased activity tolerance;Decreased balance;Decreased mobility;Decreased coordination;Decreased knowledge of use of DME       PT Treatment Interventions Gait training;Functional mobility training;Therapeutic activities;Neuromuscular re-education;Patient/family education;DME instruction    PT Goals (Current goals can be found in the Care Plan section)  Acute Rehab PT Goals Patient Stated Goal: get back to independent PT Goal Formulation: With patient Time For Goal Achievement: 05/30/22 Potential to Achieve Goals: Good    Frequency Min 3X/week     Co-evaluation               AM-PAC PT "6 Clicks" Mobility  Outcome Measure Help needed turning from your back to your side while in a flat bed without using bedrails?: A Little Help needed moving from lying on your back to sitting on the side of a flat bed without using bedrails?: A Little Help needed moving to and from a bed to a chair (including a wheelchair)?: A Little Help needed standing up from a chair using your arms (e.g., wheelchair or bedside chair)?: A Little Help needed to walk in hospital room?: A Little Help needed climbing 3-5 steps with a railing? : Total 6 Click Score: 16    End of Session   Activity Tolerance: Patient  tolerated treatment well;Patient limited by fatigue Patient left: in chair;with call bell/phone within reach Nurse Communication: Mobility status PT Visit Diagnosis: Other abnormalities of gait and mobility (R26.89);Other symptoms and signs involving the nervous system (R29.898);Difficulty in walking, not elsewhere classified (R26.2)    Time: 1240-1319 PT Time Calculation (min) (ACUTE ONLY): 39 min   Charges:   PT Evaluation $PT Eval Moderate Complexity: 1 Mod          05/16/2022  Ginger Carne., PT Acute Rehabilitation Services [phone]  (office)  Tessie Fass Jessicia Napolitano 05/16/2022, 3:37 PM

## 2022-05-16 NOTE — Progress Notes (Signed)
EEG complete - results pending 

## 2022-05-16 NOTE — Evaluation (Signed)
Occupational Therapy Evaluation Patient Details Name: Lauren Craig MRN: [id number] DOB: 12-28-1945 Today's Date: 05/16/2022   History of Present Illness Pt is a 76 y/o female presenting to the ED 12/17 with slurred speech and weakness, left facial droop and decreased vision L upper visual field.  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 Currently she is setup/S-Mod A for basic ADLs and Min A for transfers with +1 for safety. She will continue to benefit from acute OT with follow up HHOT and 24 hour S/prn A.      Recommendations for follow up therapy are one component of a multi-disciplinary discharge planning process, led by the attending physician.  Recommendations may be updated based on patient status, additional functional criteria and insurance authorization.   Follow Up Recommendations  Home health OT     Assistance Recommended at Discharge Frequent or constant Supervision/Assistance  Patient can return home with the following A little help with walking and/or transfers;A little help with bathing/dressing/bathroom;Assistance with cooking/housework;Assist for transportation;Help with stairs or ramp for entrance    Functional Status Assessment  Patient has had a recent decline in their functional status and demonstrates the ability to make significant improvements in function in a reasonable and predictable amount of time.  Equipment Recommendations  None recommended by OT       Precautions / Restrictions Precautions Precautions: Fall Restrictions Weight Bearing Restrictions: No      Mobility Bed Mobility Overal bed mobility: Needs  Assistance Bed Mobility: Supine to Sit     Supine to sit: Min assist     General bed mobility comments: cues for sequencing, no assist for LE's, truncal assist up and forward minimally.  No assist needed for scooting to EOB.    Transfers Overall transfer level: Needs assistance   Transfers: Sit to/from Stand, Bed to chair/wheelchair/BSC Sit to Stand: Min assist     Step pivot transfers: Min assist, +2 safety/equipment     General transfer comment: face to face assist to stand and to step over to the [facility] and BSC to the chair.      Balance Overall balance assessment: Needs assistance Sitting-balance support: Single extremity supported, Feet supported       Standing balance support: Bilateral upper extremity supported, During functional activity Standing balance-Leahy Scale: Poor Standing balance comment: reliant on external support                           ADL either performed or assessed with clinical judgement   ADL Overall ADL's : Needs assistance/impaired Eating/Feeding: Independent;Sitting   Grooming: Set up;Supervision/safety;Sitting   Upper Body Bathing: Supervision/ safety;Set up;Sitting   Lower Body Bathing: Moderate assistance Lower Body Bathing Details (indicate cue type and reason): min A sit<>stand Upper Body Dressing : Supervision/safety;Set up;Sitting   Lower Body Dressing: Moderate assistance Lower Body Dressing Details (indicate cue type and reason): min A sit<>stand Toilet Transfer: Minimal assistance;Stand-pivot;BSC/3in1   Toileting- Clothing Manipulation and Hygiene: Minimal assistance;Sit to/from stand               Vision Baseline Vision/History: 1 Wears glasses Ability to  See in Adequate Light: 0 Adequate Patient Visual Report: No change from baseline              Pertinent Vitals/Pain Pain Assessment Pain Assessment: Faces Faces Pain Scale: No hurt     Hand Dominance Right   Extremity/Trunk Assessment Upper  Extremity Assessment Upper Extremity Assessment: Generalized weakness   Lower Extremity Assessment Lower Extremity Assessment: Generalized weakness;Overall WFL for tasks assessed (L weaker than R LE)       Communication Communication Communication: Expressive difficulties   Cognition Arousal/Alertness: Awake/alert Behavior During Therapy: WFL for tasks assessed/performed Overall Cognitive Status: Within Functional Limits for tasks assessed                                       General Comments  vss            Home Living Family/patient expects to be discharged to:: Private residence Living Arrangements: Other relatives Available Help at Discharge: Family;Available 24 hours/day Type of Home: House Home Access: Stairs to enter [employer] of Steps: 1   Home Layout: Two level;Able to live on main level with bedroom/bathroom     Bathroom Shower/Tub: Occupational psychologist: Handicapped height     Home Equipment: [place] in;Wheelchair - manual;Cane - single point;Rolling Environmental consultant (2 wheels)      Lives With: Alone    Prior Functioning/Environment Prior Level of Function : Independent/Modified Independent                        OT Problem List: Impaired balance (sitting and/or standing)      OT Treatment/Interventions: Self-care/ADL training;DME and/or AE instruction;Balance training;Patient/family education    OT Goals(Current goals can be found in the care plan section) Acute Rehab OT Goals Patient Stated Goal: to go home OT Goal Formulation: With patient/family Time For Goal Achievement: 06/06/22 Potential to Achieve Goals: Good  OT Frequency: Min 2X/week    Co-evaluation PT/OT/SLP Co-Evaluation/Treatment: Yes Reason for Co-Treatment: For patient/therapist safety;To address functional/ADL transfers PT goals addressed during session: Mobility/safety with mobility;Balance;Strengthening/ROM OT goals  addressed during session: Strengthening/ROM;ADL's and self-care      AM-PAC OT "6 Clicks" Daily Activity     Outcome Measure Help from another person eating meals?: A Little Help from another person taking care of personal grooming?: A Little Help from another person toileting, which includes using toliet, bedpan, or urinal?: A Little Help from another person bathing (including washing, rinsing, drying)?: A Little Help from another person to put on and taking off regular upper body clothing?: A Little Help from another person to put on and taking off regular lower body clothing?: A Little 6 Click Score: 18   End of Session Nurse Communication: Mobility status  Activity Tolerance: Patient tolerated treatment well Patient left: in chair;with call bell/phone within reach;with family/visitor present  OT Visit Diagnosis: Unsteadiness on feet (R26.81);Other abnormalities of gait and mobility (R26.89);Muscle weakness (generalized) (M62.81)                Time: 2297-9892 OT Time Calculation (min): 38 min Charges:  OT General Charges $OT Visit: 1 Visit OT Evaluation $OT Eval Moderate Complexity: 1 Mod OT Treatments $Self Care/Home Management : 8-22 mins  Golden Circle, OTR/L [facility] [phone] Office [phone]    Almon Register 05/16/2022, 4:04 PM

## 2022-05-16 NOTE — Progress Notes (Signed)
Family Medicine PCP Note In to see patient for social visit as PCP. Provided supportive listening, patient tearful about situation and events. Very worried about her speech. She is hopeful to regain strength, understands she will likely need rehabilitation after this event.  Appreciate Neurology care of this patient.  Dorris Singh, MD  Family Medicine Teaching Service

## 2022-05-16 NOTE — Progress Notes (Addendum)
STROKE TEAM PROGRESS NOTE   INTERVAL HISTORY Patient was seen in her room with no family at the bedside.  The day before yesterday, family noted that she seemed unsteady and had slurred speech.  Yesterday, she was found on the floor in her house and was brought to the emergency department.  She was noted to have a left facial droop and left upper visual field cut.  Blood pressure was elevated with systolics in the 761P, and CT head revealed trace subarachnoid hemorrhage at the right vertex.  MRI demonstrated DWI artifact at right vertex and area of subarachnoid hemorrhage with scattered small acute right MCA territory infarcts.  Vitals:   05/16/22 1200 05/16/22 1300 05/16/22 1312 05/16/22 1400  BP: (!) 146/51 (!) 154/53  (!) 163/59  Pulse: 64 67 63   Resp: 14 14 18 19   Temp: 98 F (36.7 C)     TempSrc: Oral     SpO2: 94% 98% 93% 95%   CBC:  Recent Labs  Lab 05/15/22 1213 05/15/22 1248 05/16/22 0624  WBC 10.2  --  6.8  NEUTROABS 8.8*  --   --   HGB 11.2* 11.9* 10.1*  HCT 34.5* 35.0* 30.8*  MCV 81.8  --  81.9  PLT 247  --  509   Basic Metabolic Panel:  Recent Labs  Lab 05/15/22 1213 05/15/22 1248 05/16/22 0624  NA 136 136 137  K 5.3* 5.6* 5.0  CL 108 109 112*  CO2 19*  --  16*  GLUCOSE 182* 175* 103*  BUN 42* 39* 43*  CREATININE 2.10* 2.10* 2.26*  CALCIUM 9.1  --  8.4*  MG  --   --  1.8   Lipid Panel:  Recent Labs  Lab 05/15/22 1256  CHOL 324*  TRIG 102  HDL 50  CHOLHDL 6.5  VLDL 20  LDLCALC 254*   HgbA1c:  Recent Labs  Lab 05/15/22 1256  HGBA1C 6.4*   Urine Drug Screen: No results for input(s): "LABOPIA", "COCAINSCRNUR", "LABBENZ", "AMPHETMU", "THCU", "LABBARB" in the last 168 hours.  Alcohol Level  Recent Labs  Lab 05/15/22 1215  ETH <10    IMAGING past 24 hours DG Swallowing Func-Speech Pathology  Result Date: 05/16/2022 Table formatting from the original result was not included. Objective Swallowing Evaluation: Type of Study: MBS-Modified  Barium Swallow Study  Patient Details Name: Lauren Craig MRN: [id number] Date of Birth: 03-May-1946 Today's Date: 05/16/2022 Time: SLP Start Time (ACUTE ONLY): 54 -SLP Stop Time (ACUTE ONLY): 0998 SLP Time Calculation (min) (ACUTE ONLY): 12 min Past Medical History: Past Medical History: Diagnosis Date  Anxiety disorder   Arthritis   hands, hips  C. difficile colitis   Esophagitis   Essential (primary) hypertension 1990  Mixed hyperlipidemia 2010  Osteomyelitis ([place])   left 5th toe  PVD (peripheral vascular disease) ([place])   Seasonal allergies   Stroke (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  Vitamin B12 deficiency  Past Surgical History: Past Surgical History: Procedure Laterality Date  ABDOMINAL AORTOGRAM W/LOWER EXTREMITY N/A 08/11/2017  Procedure: ABDOMINAL AORTOGRAM W/LOWER EXTREMITY;  Surgeon: Angelia Mould, MD;  Location: [facility];  Service: Cardiovascular;  Laterality: N/A;  ABDOMINAL HYSTERECTOMY    AMPUTATION Left 11/14/2019  Procedure: Left Fifth ray amputation;  Surgeon: Wylene Simmer, MD;  Location: [facility];  Service: Orthopedics;  Laterality: Left;  AMPUTATION TOE Left 05/17/2018  Procedure: 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;  Surgeon: Wylene Simmer, MD;  Location: [facility];  Service: Orthopedics;  Laterality: Left;  BIOPSY  10/29/2020  Procedure: BIOPSY;  Surgeon: Irene Shipper, MD;  Location: [facility];  Service: Endoscopy;;  ESOPHAGOGASTRODUODENOSCOPY (EGD) WITH PROPOFOL N/A 10/29/2020  Procedure: ESOPHAGOGASTRODUODENOSCOPY (EGD) WITH PROPOFOL;  Surgeon: Irene Shipper, MD;  Location: [facility];  Service: Endoscopy;  Laterality: N/A;  PARTIAL HYMENECTOMY  1968  TONSILLECTOMY AND ADENOIDECTOMY    VAGINAL HYSTERECTOMY  1987 HPI: Pt is a 76 yo female presenting with slurred speech and weakness after unwitnessed fall. CT shows a trace SAH at the R vertex. MRI shows stable SAH at the R vertex with mild DWI infarct there as well as strong  evidence of superimposed scattered small acute R MCA territory infarcts, including corona radiata and anterior insula. PMH includes: multiple prior strokes (11/2020 R ACA infarcts; evaluated by SLP and found to have moderate memory deficits and unilateral UMN dysarthria, MBS with silent aspiration x1 with thin liquids only while swallowing pill), esophagitis, anxiety, HTN, arthritis, HLD, osteomyelitis, PVD, DM type 2  Subjective: says her speech is the biggest thing that is different  Recommendations for follow up therapy are one component of a multi-disciplinary discharge planning process, led by the attending physician.  Recommendations may be updated based on patient status, additional functional criteria and insurance authorization. Assessment / Plan / Recommendation   05/16/2022   1:00 PM Clinical Impressions Clinical Impression Pt has mild oropharyngeal dysphagia noted during this MBS, although with limited ability to challenge her fully. Despite cues to take large, consecutive boluses, she takes very small and single bites/sips at a time due to nausea. When prompted to take as big of a sip as possible, she does start to have penetration of thin liquids due to impaired timing and coordination. Penetrates clear spontaneously (PAS 2), although there may still be concern for aspiration correlated with coughing events noted at bedside when pt was drinking more impulsively. [name] oral phase is slow but also without the anterior loss observed with larger bites/sips at bedside. Recommend starting with Dys 3 diet and thin liquids, avoiding mixed consistencies. Would encourage smaller sips as well as performance on MBS suggests adequate function to handle at least smaller volumes. SLP Visit Diagnosis Dysphagia, oropharyngeal phase (R13.12) Impact on safety and function Mild aspiration risk     05/16/2022   1:00 PM Treatment Recommendations Treatment Recommendations Therapy as outlined in treatment plan below      05/16/2022   1:00 PM Prognosis Prognosis for Safe Diet Advancement Good   05/16/2022   1:00 PM Diet Recommendations SLP Diet Recommendations Dysphagia 3 (Mech soft) solids;Thin liquid;Other (Comment) Liquid Administration via Cup;Straw Medication Administration Whole meds with puree Compensations Slow rate;Small sips/bites Postural Changes Seated upright at 90 degrees     05/16/2022   1:00 PM Other Recommendations Oral Care Recommendations Oral care BID Follow Up Recommendations Acute inpatient rehab (3hours/day) Functional Status Assessment Patient has had a recent decline in their functional status and demonstrates the ability to make significant improvements in function in a reasonable and predictable amount of time.   05/16/2022   1:00 PM Frequency and Duration  Speech Therapy Frequency (ACUTE ONLY) min 2x/week Treatment Duration 2 weeks     05/16/2022   1:00 PM Oral Phase Oral Phase Impaired Oral - Thin Teaspoon Reduced posterior propulsion Oral - Thin Cup Reduced posterior propulsion Oral - Thin Straw Reduced posterior propulsion Oral - Puree Reduced posterior propulsion Oral - Regular Reduced posterior propulsion    05/16/2022   1:00 PM  Pharyngeal Phase Pharyngeal Phase Impaired Pharyngeal- Thin Teaspoon Delayed swallow initiation-pyriform sinuses Pharyngeal- Thin Cup Delayed swallow initiation-pyriform sinuses;Penetration/Aspiration before swallow Pharyngeal Material enters airway, remains ABOVE vocal cords then ejected out Pharyngeal- Thin Straw Pharyngeal residue - valleculae;Pharyngeal residue - pyriform;Delayed swallow initiation-pyriform sinuses;Penetration/Aspiration before swallow Pharyngeal Material enters airway, remains ABOVE vocal cords then ejected out Pharyngeal- Puree Delayed swallow initiation-vallecula Pharyngeal- Regular Delayed swallow initiation-vallecula    05/16/2022   1:00 PM Cervical Esophageal Phase  Cervical Esophageal Phase [facility] Osie Bond., M.A. [place]  Office [phone] Secure chat preferred 05/16/2022, 1:55 PM                     ECHOCARDIOGRAM COMPLETE  Result Date: 05/16/2022    ECHOCARDIOGRAM REPORT   Patient Name:   TAWONNA ESQUER Date of Exam: 05/16/2022 Medical Rec #:  [id number]      Height:       64.0 in Accession #:    [id number]     Weight:       159.4 lb Date of Birth:  November 19, 1945      BSA:          1.776 m Patient Age:    7 years       BP:           140/85 mmHg Patient Gender: F              HR:           72 bpm. Exam Location:  Inpatient Procedure: 2D Echo Indications:    stroke  History:        Patient has prior history of Echocardiogram examinations, most                 recent 12/19/2020. Risk Factors:Hypertension, Diabetes and                 Dyslipidemia.  Sonographer:    Harvie Junior Referring Phys: [id number] Otelia Santee  Sonographer Comments: Technically difficult study due to poor echo windows, suboptimal subcostal window and suboptimal apical window. Image acquisition challenging due to respiratory motion. IMPRESSIONS  1. Left ventricular ejection fraction, by estimation, is 55 to 60%. The left ventricle has normal function. The left ventricle has no regional wall motion abnormalities. There is mild asymmetric left ventricular hypertrophy of the basal-septal segment. Left ventricular diastolic parameters are consistent with Grade II diastolic dysfunction (pseudonormalization).  2. Right ventricular systolic function is normal. The right ventricular size is normal. There is normal pulmonary artery systolic pressure. The estimated right ventricular systolic pressure is 02.4 mmHg.  3. Left atrial size was mildly dilated.  4. The mitral valve is degenerative. Trivial mitral valve regurgitation. No evidence of mitral stenosis.  5. The aortic valve is tricuspid. Aortic valve regurgitation is not visualized. Aortic valve sclerosis/calcification is present, without any evidence of aortic stenosis.  6. The inferior vena cava is normal in size  with greater than 50% respiratory variability, suggesting right atrial pressure of 3 mmHg. Comparison(s): No significant change from prior study. Conclusion(s)/Recommendation(s): No intracardiac source of embolism detected on this transthoracic study. Consider a transesophageal echocardiogram to exclude cardiac source of embolism if clinically indicated. FINDINGS  Left Ventricle: Left ventricular ejection fraction, by estimation, is 55 to 60%. The left ventricle has normal function. The left ventricle has no regional wall motion abnormalities. The left ventricular internal cavity size was normal in size. There is  mild asymmetric left ventricular hypertrophy of the basal-septal segment. Left  ventricular diastolic parameters are consistent with Grade II diastolic dysfunction (pseudonormalization). Right Ventricle: The right ventricular size is normal. No increase in right ventricular wall thickness. Right ventricular systolic function is normal. There is normal pulmonary artery systolic pressure. The tricuspid regurgitant velocity is 1.70 m/s, and  with an assumed right atrial pressure of 3 mmHg, the estimated right ventricular systolic pressure is 16.9 mmHg. Left Atrium: Left atrial size was mildly dilated. Right Atrium: Right atrial size was normal in size. Pericardium: Trivial pericardial effusion is present. Presence of epicardial fat layer. Mitral Valve: The mitral valve is degenerative in appearance. Mild to moderate mitral annular calcification. Trivial mitral valve regurgitation. No evidence of mitral valve stenosis. Tricuspid Valve: The tricuspid valve is grossly normal. Tricuspid valve regurgitation is trivial. No evidence of tricuspid stenosis. Aortic Valve: The aortic valve is tricuspid. Aortic valve regurgitation is not visualized. Aortic valve sclerosis/calcification is present, without any evidence of aortic stenosis. Aortic valve mean gradient measures 8.4 mmHg. Aortic valve peak gradient measures  15.7 mmHg. Aortic valve area, by VTI measures 2.08 cm. Pulmonic Valve: The pulmonic valve was grossly normal. Pulmonic valve regurgitation is not visualized. No evidence of pulmonic stenosis. Aorta: The aortic root and ascending aorta are structurally normal, with no evidence of dilitation. Venous: The inferior vena cava is normal in size with greater than 50% respiratory variability, suggesting right atrial pressure of 3 mmHg. IAS/Shunts: The atrial septum is grossly normal.  LEFT VENTRICLE PLAX 2D LVIDd:         3.70 cm      Diastology LVIDs:         2.60 cm      LV e' medial:    5.59 cm/s LV PW:         1.00 cm      LV E/e' medial:  19.3 LV IVS:        1.20 cm      LV e' lateral:   7.62 cm/s LVOT diam:     2.10 cm      LV E/e' lateral: 14.2 LV SV:         80 LV SV Index:   45 LVOT Area:     3.46 cm  LV Volumes (MOD) LV vol d, MOD A2C: 60.2 ml LV vol d, MOD A4C: 113.0 ml LV vol s, MOD A2C: 19.4 ml LV vol s, MOD A4C: 46.8 ml LV SV MOD A2C:     40.8 ml LV SV MOD A4C:     113.0 ml LV SV MOD BP:      59.7 ml RIGHT VENTRICLE RV S prime:     16.80 cm/s TAPSE (M-mode): 1.8 cm LEFT ATRIUM           Index LA diam:      3.40 cm 1.91 cm/m LA Vol (A4C): 90.9 ml 51.17 ml/m  AORTIC VALVE                     PULMONIC VALVE AV Area (Vmax):    1.58 cm      PV Vmax:       1.18 m/s AV Area (Vmean):   1.69 cm      PV Peak grad:  5.6 mmHg AV Area (VTI):     2.08 cm AV Vmax:           198.00 cm/s AV Vmean:          133.400 cm/s AV VTI:  0.387 m AV Peak Grad:      15.7 mmHg AV Mean Grad:      8.4 mmHg LVOT Vmax:         90.50 cm/s LVOT Vmean:        64.900 cm/s LVOT VTI:          0.232 m LVOT/AV VTI ratio: 0.60  AORTA Ao Root diam: 3.10 cm Ao Asc diam:  2.90 cm MITRAL VALVE                TRICUSPID VALVE MV Area (PHT): 3.72 cm     TR Peak grad:   11.6 mmHg MV Decel Time: 204 msec     TR Vmax:        170.00 cm/s MV E velocity: 108.00 cm/s MV A velocity: 122.00 cm/s  SHUNTS MV E/A ratio:  0.89         Systemic VTI:  0.23  m                             Systemic Diam: 2.10 cm Eleonore Chiquito MD Electronically signed by Eleonore Chiquito MD Signature Date/Time: 05/16/2022/11:52:30 AM    Final    MR BRAIN WO CONTRAST  Result Date: 05/16/2022 CLINICAL DATA:  76 year old female neurologic deficit presenting yesterday after fall. Trace vertex subarachnoid hemorrhage. Atherosclerotic stenoses on CTA. EXAM: MRI HEAD WITHOUT CONTRAST TECHNIQUE: Multiplanar, multiecho pulse sequences of the brain and surrounding structures were obtained without intravenous contrast. COMPARISON:  CT head, CTA head and neck yesterday. Brain MRI 12/18/2020. FINDINGS: Brain: Scattered small areas of restricted diffusion in the right MCA territory, including pre motor cortex, right corona radiata (series 2, image 36) and anterior insula. Abnormal diffusion at the right superior motor strip/central sulcus is probably artifact from known subarachnoid hemorrhage there (see FLAIR series 7, image 22). No intraventricular or other extra-axial hemorrhage identified. No contralateral left hemisphere or posterior fossa restricted diffusion. Mild cytotoxic edema at the acutely affected areas. No mass effect. 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 Superimposed chronic brainstem microhemorrhages. Stable overall cerebral volume from last year. No midline shift, mass effect, evidence of mass lesion, ventriculomegaly. Cervicomedullary junction and pituitary are within normal limits. Vascular: Major intracranial vascular flow voids appear stable from last year. Skull and upper cervical spine: Visible cervical spine appears stable, negative for age. Visualized bone marrow signal is within normal limits. Sinuses/Orbits: Stable. Other: Mild chronic mastoid effusions appear stable. Negative visible nasopharynx. IMPRESSION: 1. Stable trace SAH at the right vertex with mild DWI artifact there. But  strong evidence of superimposed scattered small Acute Right MCA territory infarcts, including corona radiata and anterior insula. No hemorrhagic transformation or mass effect. 2. Underlying advanced chronic small vessel disease. Electronically Signed   By: Genevie Ann M.D.   On: 05/16/2022 09:06    PHYSICAL EXAM General: Alert, well-developed well-nourished elderly patient in no acute distress Respiratory: Regular, unlabored respirations on room air  NEURO:  Mental Status: AA&Ox3  Speech/Language: speech is with some dysarthria  Cranial Nerves:  II: PERRL.  III, IV, VI: EOMI. Eyelids elevate symmetrically.  V: Sensation is intact to light touch and symmetrical to face.  VII: Left facial droop present VIII: hearing intact to voice. IX, X: Voice is dysarthric XII: tongue is midline without fasciculations. Motor: 5/5 strength to bilateral upper extremities and right lower extremity, 4+ out of 5 strength to left  lower extremity Tone: is normal and bulk is normal Sensation- Intact to light touch bilaterally.  Gait- deferred   ASSESSMENT/PLAN Lauren Craig is a 76 y.o. female with history of multiple prior strokes, hypertension, arthritis, hyperlipidemia, osteomyelitis, PVD, diabetes, esophagitis and anxiety presenting after being found down at home with dysarthria and left-sided facial droop. The day before yesterday, family noted that she seemed unsteady and had slurred speech.  Yesterday, she was found on the floor in her house and was brought to the emergency department.  She was noted to have a left facial droop and left upper visual field cut.  Blood pressure was elevated with systolics in the 035D, and CT head revealed trace subarachnoid hemorrhage at the right vertex.  MRI demonstrated DWI artifact at right vertex and area of subarachnoid hemorrhage with scattered small acute right MCA territory infarcts.  Stroke:  right MCA small scattered infarcts Etiology: Likely embolic Code  Stroke CT head trace subarachnoid hemorrhage at right vertex CTA head & neck no LVO, high-grade stenosis at origin of right vertebral artery, high-grade stenosis of distal right V4, bilateral P2 moderate stenosis, mild narrowing of distal M1 segments bilaterally MRI stable trace subarachnoid hemorrhage at right vertex with mild DWI artifact there, also scattered small acute right MCA territory infarcts 2D Echo EF 55 to 97%, grade 2 diastolic dysfunction, mildly dilated left atrium, normal atrial septum LDL 254 HgbA1c 6.4 Will consider loop recorder versus 30-day cardiac monitor to rule out A-fib VTE prophylaxis -SCDs    Diet   DIET DYS 3 Room service appropriate? Yes with Assist; Fluid consistency: Thin   clopidogrel 75 mg daily prior to admission, now on No antithrombotic secondary to subarachnoid hemorrhage  Therapy recommendations: CIR Disposition: Pending  Hypertension Home meds: Indapamide 2.5 mg daily, amlodipine 10 mg once daily, carvedilol 6.25 mg daily, enalapril 20 mg at bedtime Start amlodipine 10 mg daily and carvedilol 6.25 mg daily as well as enalapril 20 mg at bedtime Stable off Cleviprex Keep systolic blood pressure less than 160 Long-term BP goal normotensive  Hyperlipidemia Home meds: Ezetimibe 10 mg daily, resumed in hospital LDL 254, goal < 70 Add rosuvastatin 40 mg daily Consider referral to the lipid clinic if high intensity statin does not lower LDL to goal Continue statin at discharge  Diabetes type II Controlled Home meds: None HgbA1c 6.4, goal < 7.0 CBGs Recent Labs    05/15/22 1211 05/16/22 1135  GLUCAP 152* 133*    SSI  Other Stroke Risk Factors Advanced Age >/= 93  Former cigarette smoker  Other Active Problems Diarrhea Imodium as needed Placed patient on enteric contact precautions and test for C. difficile as she was on Dificid at home  Hospital day # [place] , MSN, AGACNP-BC [facility] See Amion for  schedule and pager information 05/16/2022 2:47 PM   STROKE MD NOTE :  I have personally obtained history,examined this patient, reviewed notes, independently viewed imaging studies, participated in medical decision making and plan of care.ROS completed by me personally and pertinent positives fully documented  I have made any additions or clarifications directly to the above note. Agree with note above.  Patient presented with slurred speech and left-sided weakness and CT scan shows small localized right posterior frontal subarachnoid hemorrhage but MRI shows multiple small scattered MCA branch infarcts likely of embolic etiology.  Continue close neurological monitoring and strict blood pressure control with systolic goal between 416-384 for the first 24 hours and then  below 160.  Hold antiplatelet agents for now.  Continue ongoing stroke workup.  No family available at the bedside for discussion.This patient is critically ill and at significant risk of neurological worsening, death and care requires constant monitoring of vital signs, hemodynamics,respiratory and cardiac monitoring, extensive review of multiple databases, frequent neurological assessment, discussion with family, other specialists and medical decision making of high complexity.I have made any additions or clarifications directly to the above note.This critical care time does not reflect procedure time, or teaching time or supervisory time of PA/NP/Med Resident etc but could involve care discussion time.  I spent 30 minutes of neurocritical care time  in the care of  this patient.      Antony Contras, MD Medical Director [facility] Pager: [phone] 05/16/2022 5:34 PM  To contact Stroke Continuity provider, please refer to [email or web address] After hours, contact General Neurology

## 2022-05-16 NOTE — Procedures (Signed)
Patient Name: Lauren Craig  MRN: [id number]  Epilepsy Attending: Lora Havens  Referring Physician/Provider: Katy Apo, NP  Date: 05/16/2022 Duration: 21.46 mins  Patient history: 76 y.o. 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 She presents to the ED c/o slurred speech and weakness. CT shows a trace subarachnoid hemorrhage. EEG to evaluate for seizure.  Level of alertness: Awake  AEDs during EEG study: None  Technical aspects: This EEG study was done with scalp electrodes positioned according to the 10-20 International system of electrode placement. Electrical activity was reviewed with band pass filter of 1-70Hz , sensitivity of 7 uV/mm, display speed of 78mm/sec with a 60Hz  notched filter applied as appropriate. EEG data were recorded continuously and digitally stored.  Video monitoring was available and reviewed as appropriate.  Description: The posterior dominant rhythm consists of 7.5 Hz activity of moderate voltage (25-35 uV) seen predominantly in posterior head regions, asymmetric (right <left) and reactive to eye opening and eye closing.  EEG showed continuous 3 to 5 Hz 11 delta slowing in right hemisphere as well as intermittent generalized 3 to 6 Hz theta-delta slowing. Hyperventilation and photic stimulation were not performed.     ABNORMALITY - Continuous slow, right hemisphere - Intermittent slow, generalized - Background asymmetry, right< left  IMPRESSION: This study is suggestive of cortical dysfunction in right hemisphere likely secondary to underlying structural and normality. Additionally there is mild diffuse encephalopathy, nonspecific etiology. No seizures or epileptiform discharges were seen throughout the recording.  Dayn Barich Barbra Sarks

## 2022-05-16 NOTE — Evaluation (Signed)
Clinical/Bedside Swallow Evaluation Patient Details  Name: Lauren Craig MRN: [id number] Date of Birth: 06-29-1945  Today's Date: 05/16/2022 Time: SLP Start Time (ACUTE ONLY): 0998 SLP Stop Time (ACUTE ONLY): 3382 SLP Time Calculation (min) (ACUTE ONLY): 18 min  Past Medical History:  Past Medical History:  Diagnosis Date   Anxiety disorder    Arthritis    hands, hips   C. 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   Vitamin B12 deficiency    Past Surgical History:  Past Surgical History:  Procedure Laterality Date   ABDOMINAL AORTOGRAM W/LOWER EXTREMITY N/A 08/11/2017   Procedure: ABDOMINAL AORTOGRAM W/LOWER EXTREMITY;  Surgeon: Angelia Mould, MD;  Location: [facility];  Service: Cardiovascular;  Laterality: N/A;   ABDOMINAL HYSTERECTOMY     AMPUTATION Left 11/14/2019   Procedure: Left Fifth ray amputation;  Surgeon: Wylene Simmer, MD;  Location: [facility];  Service: Orthopedics;  Laterality: Left;   AMPUTATION TOE Left 05/17/2018   Procedure: 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;  Surgeon: Wylene Simmer, MD;  Location: [place];  Service: Orthopedics;  Laterality: Left;   BIOPSY  10/29/2020   Procedure: BIOPSY;  Surgeon: Irene Shipper, MD;  Location: [facility] ENDOSCOPY;  Service: Endoscopy;;   ESOPHAGOGASTRODUODENOSCOPY (EGD) WITH PROPOFOL N/A 10/29/2020   Procedure: ESOPHAGOGASTRODUODENOSCOPY (EGD) WITH PROPOFOL;  Surgeon: Irene Shipper, MD;  Location: [facility] ENDOSCOPY;  Service: Endoscopy;  Laterality: N/A;   PARTIAL HYMENECTOMY  1968   TONSILLECTOMY AND ADENOIDECTOMY     VAGINAL HYSTERECTOMY  1987   HPI:  Pt is a 76 yo female presenting with slurred speech and weakness after unwitnessed fall. CT shows a trace SAH  at the R vertex. MRI shows stable SAH at the R vertex with mild DWI infarct there as well as strong evidence of superimposed scattered small acute R MCA territory infarcts, including corona radiata and anterior insula. PMH includes: multiple prior strokes (11/2020 R ACA infarcts; evaluated by SLP and found to have moderate memory deficits and unilateral UMN dysarthria, MBS with silent aspiration x1 with thin liquids only while swallowing pill), esophagitis, anxiety, HTN, arthritis, HLD, osteomyelitis, PVD, DM type 2    Assessment / Plan / Recommendation  Clinical Impression  Pt has L sided facial weakness (CN VII) with mild anterior spillage of thin liquids and purees, although often with adequate awareness suggestive of sensation. She seems to have episodes of reduced coordination that result in coughing with thin liquids. This primarily happens with larger boluses or consecutive sips, regardless of whether pt uses cup or straw. Recommend proceeding with MBS. Pt can have small, single sips of water or ice chip in the interim as well as meds whole in puree. SLP Visit Diagnosis: Dysphagia, unspecified (R13.10)    Aspiration Risk  Mild aspiration risk;Moderate aspiration risk    Diet Recommendation NPO except meds;Ice chips PRN after oral care;Free water protocol after oral care   Liquid Administration via: Cup;Straw Medication Administration: Whole meds with puree    Other  Recommendations Oral Care Recommendations: Oral care BID    Recommendations for follow up therapy are one component of a multi-disciplinary discharge planning process, led by the attending physician.  Recommendations may be updated  based on patient status, additional functional criteria and insurance authorization.  Follow up Recommendations  (tba)      Assistance Recommended at Discharge    Functional Status Assessment Patient has had a recent decline in their functional status and demonstrates the ability to make  significant improvements in function in a reasonable and predictable amount of time.  Frequency and Duration            Prognosis Prognosis for Safe Diet Advancement: Good      Swallow Study   General HPI: Pt is a 76 yo female presenting with slurred speech and weakness after unwitnessed fall. CT shows a trace SAH at the R vertex. MRI shows stable SAH at the R vertex with mild DWI infarct there as well as strong evidence of superimposed scattered small acute R MCA territory infarcts, including corona radiata and anterior insula. PMH includes: multiple prior strokes (11/2020 R ACA infarcts; evaluated by SLP and found to have moderate memory deficits and unilateral UMN dysarthria, MBS with silent aspiration x1 with thin liquids only while swallowing pill), esophagitis, anxiety, HTN, arthritis, HLD, osteomyelitis, PVD, DM type 2 Type of Study: Bedside Swallow Evaluation Previous Swallow Assessment: see HPI Diet Prior to this Study: NPO Temperature Spikes Noted: No Respiratory Status: Room air History of Recent Intubation: No Behavior/Cognition: Alert;Cooperative;Pleasant mood Oral Cavity Assessment: Within Functional Limits Oral Care Completed by SLP: No Oral Cavity - Dentition: Dentures, top;Dentures, bottom (partials on bottom) Vision: Functional for self-feeding Self-Feeding Abilities: Able to feed self Patient Positioning: Upright in bed Baseline Vocal Quality: Normal (but speech dysarthric) Volitional Cough: Strong Volitional Swallow: Able to elicit    Oral/Motor/Sensory Function Overall Oral Motor/Sensory Function: Moderate impairment Facial ROM: Reduced left;Suspected CN VII (facial) dysfunction Facial Symmetry: Abnormal symmetry left;Suspected CN VII (facial) dysfunction Facial Strength: Reduced left;Suspected CN VII (facial) dysfunction Lingual ROM: Reduced left;Suspected CN XII (hypoglossal) dysfunction Lingual Symmetry: Abnormal symmetry left;Suspected CN XII (hypoglossal)  dysfunction Velum: Within Functional Limits Mandible: Within Functional Limits   Ice Chips Ice chips: Within functional limits Presentation: Spoon   Thin Liquid Thin Liquid: Impaired Presentation: Cup;Self Fed;Straw Oral Phase Functional Implications: Left anterior spillage Pharyngeal  Phase Impairments: Cough - Immediate    Nectar Thick Nectar Thick Liquid: Not tested   Honey Thick Honey Thick Liquid: Not tested   Puree Puree: Impaired Presentation: Self Fed;Spoon Oral Phase Functional Implications: Left anterior spillage   Solid     Solid: Impaired Presentation: Self Fed Oral Phase Functional Implications: Oral residue (primarily on upper dentures)      Osie Bond., M.A. [place] Office [phone]  Secure chat preferred  05/16/2022,12:11 PM

## 2022-05-16 NOTE — Evaluation (Signed)
Speech Language Pathology Evaluation Patient Details Name: Lauren Craig MRN: [id number] DOB: [birth year] Today's Date: 05/16/2022 Time: 8786-7672 SLP Time Calculation (min) (ACUTE ONLY): 21 min  Problem List:  Patient Active Problem List   Diagnosis Date Noted   ICH (intracerebral hemorrhage) ([place]) 05/15/2022   Chronic Nausea, Vomiting, Intermittent Diarrhea 04/05/2022   Senile purpura ([place]) 02/23/2022   Memory changes 02/23/2022   Chronic kidney disease (CKD), stage III (moderate) ([place]) 08/27/2021   Arthritis 03/15/2021   Esophagitis 03/15/2021   Renal cyst 12/17/2020   Visual field defect 11/08/2020   Cerebrovascular disease 11/08/2020   HTN (hypertension) Difficult to Control with Isolated Systolic HTN  09/47/0962   Hyperlipidemia 10/03/2017   Controlled diabetes mellitus type 2 with complications ([place]) 83/66/2947   PVD (peripheral vascular disease) ([place]) 2010   Vitamin B12 deficiency 2002   Past Medical History:  Past Medical History:  Diagnosis Date   Anxiety disorder    Arthritis    hands, hips   C. 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   Vitamin B12 deficiency    Past Surgical History:  Past Surgical History:  Procedure Laterality Date   ABDOMINAL AORTOGRAM W/LOWER EXTREMITY N/A 08/11/2017   Procedure: ABDOMINAL AORTOGRAM W/LOWER EXTREMITY;  Surgeon: Angelia Mould, MD;  Location: [place] [facility] [facility];  Service: Cardiovascular;  Laterality: N/A;   ABDOMINAL HYSTERECTOMY     AMPUTATION Left 11/14/2019   Procedure: Left Fifth ray amputation;  Surgeon: Wylene Simmer, MD;  Location: [place];  Service: Orthopedics;  Laterality: Left;   AMPUTATION TOE Left 05/17/2018   Procedure: Left 5th toe  amputation;  Surgeon: Wylene Simmer, MD;  Location: [place];  Service: Orthopedics;  Laterality: Left;   BIOPSY  10/29/2020   Procedure: BIOPSY;  Surgeon: Irene Shipper, MD;  Location: [facility] ENDOSCOPY;  Service: Endoscopy;;   ESOPHAGOGASTRODUODENOSCOPY (EGD) WITH PROPOFOL N/A 10/29/2020   Procedure: ESOPHAGOGASTRODUODENOSCOPY (EGD) WITH PROPOFOL;  Surgeon: Irene Shipper, MD;  Location: [facility] ENDOSCOPY;  Service: Endoscopy;  Laterality: N/A;   PARTIAL HYMENECTOMY  1968   TONSILLECTOMY AND ADENOIDECTOMY     VAGINAL HYSTERECTOMY  1987   HPI:  Pt is a 76 yo female presenting with slurred speech and weakness after unwitnessed fall. CT shows a trace SAH at the R vertex. MRI shows stable SAH at the R vertex with mild DWI infarct there as well as strong evidence of superimposed scattered small acute R MCA territory infarcts, including corona radiata and anterior insula. PMH includes: multiple prior strokes (11/2020 R ACA infarcts; evaluated by SLP and found to have moderate memory deficits and unilateral UMN dysarthria, MBS with silent aspiration x1 with thin liquids only while swallowing pill), esophagitis, anxiety, HTN, arthritis, HLD, osteomyelitis, PVD, DM type 2   Assessment / Plan / Recommendation Clinical Impression  Pt has L sided facial weakness that impacts articulation. She is already trying to over articulate her speech to compensate for this, making her speech relatively intelligible even in longer utterances. At times when she is a little more emotional, or talking more quickly, it becomes more difficult to understand. Basic comprehension and verbal expression are appropriate although she does seem to have some reduced sustained attention  in terms or topic maintenance. Pt will benefit from SLP f/u for dysarthria with ongoing assessment of higher level cognitive skills.    SLP Assessment  SLP Recommendation/Assessment: Patient needs continued Speech Export Pathology Services SLP Visit  Diagnosis: Dysarthria and anarthria (R47.1)    Recommendations for follow up therapy are one component of a multi-disciplinary discharge planning process, led by the attending physician.  Recommendations may be updated based on patient status, additional functional criteria and insurance authorization.    Follow Up Recommendations  Acute inpatient rehab (3hours/day)    Assistance Recommended at Discharge  Intermittent Supervision/Assistance  Functional Status Assessment Patient has had a recent decline in their functional status and demonstrates the ability to make significant improvements in function in a reasonable and predictable amount of time.  Frequency and Duration min 2x/week  2 weeks      SLP Evaluation Cognition  Overall Cognitive Status: Impaired/Different from baseline Arousal/Alertness: Awake/alert Orientation Level: Oriented X4 Attention: Sustained Sustained Attention: Impaired Sustained Attention Impairment: Verbal complex Memory: Appears intact Awareness: Appears intact       Comprehension  Auditory Comprehension Overall Auditory Comprehension: Appears within functional limits for tasks assessed    Expression Expression Primary Mode of Expression: Verbal Verbal Expression Overall Verbal Expression: Appears within functional limits for tasks assessed   Oral / Motor  Oral Motor/Sensory Function Overall Oral Motor/Sensory Function: Moderate impairment Facial ROM: Reduced left;Suspected CN VII (facial) dysfunction Facial Symmetry: Abnormal symmetry left;Suspected CN VII (facial) dysfunction Facial Strength: Reduced left;Suspected CN VII (facial) dysfunction Lingual ROM: Reduced left;Suspected CN XII (hypoglossal) dysfunction Lingual Symmetry: Abnormal symmetry left;Suspected CN XII (hypoglossal) dysfunction Velum: Within Functional Limits Mandible: Within Functional Limits Motor Speech Overall Motor Speech: Impaired Respiration: Within functional  limits Phonation: Normal Resonance: Within functional limits Articulation: Impaired Level of Impairment: Conversation Intelligibility: Intelligibility reduced Conversation: 75-100% accurate Motor Planning: Witnin functional limits Effective Techniques: Over-articulate            Osie Bond., M.A. [place] Office [phone]  Secure chat preferred  05/16/2022, 12:41 PM

## 2022-05-16 NOTE — Progress Notes (Signed)
  Echocardiogram 2D Echocardiogram has been performed.  Lauren Craig 05/16/2022, 10:52 AM

## 2022-05-17 DIAGNOSIS — I611 Nontraumatic intracerebral hemorrhage in hemisphere, cortical: Secondary | ICD-10-CM | POA: Diagnosis not present

## 2022-05-17 LAB — GLUCOSE, CAPILLARY
Glucose-Capillary: 128 mg/dL — ABNORMAL HIGH (ref 70–99)
Glucose-Capillary: 125 mg/dL — ABNORMAL HIGH (ref 70–99)

## 2022-05-17 MED ORDER — LABETALOL HCL 5 MG/ML IV SOLN: 10.0000 mg | INTRAVENOUS | Status: DC | PRN

## 2022-05-17 MED ORDER — HYDRALAZINE HCL 20 MG/ML IJ SOLN: 10.0000 mg | INTRAMUSCULAR | Status: DC | PRN

## 2022-05-17 NOTE — Progress Notes (Signed)
Speech Language Pathology Treatment: Dysphagia  Patient Details Name: Lauren Craig MRN: [id number] DOB: 07/07/1945 Today's Date: 05/17/2022 Time: 8295-6213 SLP Time Calculation (min) (ACUTE ONLY): 31 min  Assessment / Plan / Recommendation Clinical Impression  Pt was seen for f/u with daughter, Lauren Craig, at bedside today. [name] speech seems to be mildly improved today and they both believes so as well. Pt declined solid POs, reporting that she had just eaten, and especially considering the vomiting she has, SLP did not want to push this. Pt did agree to sips of thin liquids though. She consumed them via straw, initially drinking consecutively with the appearance of reduced coordination and coughing that followed. Education was reinforced about taking single sips, and really using a pause in between each one. When implementing this strategy, no further coughing was noted. Recommend continuing Dys 3 diet and thin liquids. Recommendations and precautions were reviewed with both pt and daughter, as Lauren Craig plans to help upon discharge. All questions answered at this time.     HPI HPI: Pt is a 76 yo female presenting with slurred speech and weakness after unwitnessed fall. CT shows a trace SAH at the R vertex. MRI shows stable SAH at the R vertex with mild DWI infarct there as well as strong evidence of superimposed scattered small acute R MCA territory infarcts, including corona radiata and anterior insula. PMH includes: multiple prior strokes (11/2020 R ACA infarcts; evaluated by SLP and found to have moderate memory deficits and unilateral UMN dysarthria, MBS with silent aspiration x1 with thin liquids only while swallowing pill), esophagitis, anxiety, HTN, arthritis, HLD, osteomyelitis, PVD, DM type 2      SLP Plan  Continue with current plan of care      Recommendations for follow up therapy are one component of a multi-disciplinary discharge planning process, led by the attending physician.   Recommendations may be updated based on patient status, additional functional criteria and insurance authorization.    Recommendations  Diet recommendations: Dysphagia 3 (mechanical soft);Thin liquid Liquids provided via: Cup;Straw Medication Administration: Whole meds with puree Supervision: Patient able to self feed;Intermittent supervision to cue for compensatory strategies Compensations: Slow rate;Small sips/bites Postural Changes and/or Swallow Maneuvers: Seated upright 90 degrees                Oral Care Recommendations: Oral care BID Follow Up Recommendations: Home health SLP Assistance recommended at discharge: Intermittent Supervision/Assistance SLP Visit Diagnosis: Dysphagia, oropharyngeal phase (R13.12) Plan: Continue with current plan of care           Osie Bond., M.A. [place] Office [phone]  Secure chat preferred   05/17/2022, 2:57 PM

## 2022-05-17 NOTE — Progress Notes (Signed)
STROKE TEAM PROGRESS NOTE   INTERVAL HISTORY Patient was seen in her room with no family at the bedside.   Patient is doing much better.  She is awake alert and interactive.  Blood pressure is better controlled but she still requiring Cleviprex drip which was restarted last night.  MRI scan of the brain yesterday showed stable trace subarachnoid hemorrhage over the right vertex with evidence of scattered acute right MCA infarcts.  Vitals:   05/17/22 1200 05/17/22 1300 05/17/22 1400 05/17/22 1500  BP: (!) 169/57 (!) 169/56 (!) 139/49 (!) 152/58  Pulse: 62 62 (!) 59 (!) 58  Resp: 16 16 15 16   Temp: 97.6 F (36.4 C)     TempSrc: Oral     SpO2: 94% 93% 94% 95%   CBC:  Recent Labs  Lab 05/15/22 1213 05/15/22 1248 05/16/22 0624  WBC 10.2  --  6.8  NEUTROABS 8.8*  --   --   HGB 11.2* 11.9* 10.1*  HCT 34.5* 35.0* 30.8*  MCV 81.8  --  81.9  PLT 247  --  160   Basic Metabolic Panel:  Recent Labs  Lab 05/15/22 1213 05/15/22 1248 05/16/22 0624  NA 136 136 137  K 5.3* 5.6* 5.0  CL 108 109 112*  CO2 19*  --  16*  GLUCOSE 182* 175* 103*  BUN 42* 39* 43*  CREATININE 2.10* 2.10* 2.26*  CALCIUM 9.1  --  8.4*  MG  --   --  1.8   Lipid Panel:  Recent Labs  Lab 05/15/22 1256  CHOL 324*  TRIG 102  HDL 50  CHOLHDL 6.5  VLDL 20  LDLCALC 254*   HgbA1c:  Recent Labs  Lab 05/15/22 1256  HGBA1C 6.4*   Urine Drug Screen: No results for input(s): "LABOPIA", "COCAINSCRNUR", "LABBENZ", "AMPHETMU", "THCU", "LABBARB" in the last 168 hours.  Alcohol Level  Recent Labs  Lab 05/15/22 1215  ETH <10    IMAGING past 24 hours No results found.  PHYSICAL EXAM General: Alert, well-developed well-nourished elderly patient in no acute distress Respiratory: Regular, unlabored respirations on room air  NEURO:  Mental Status: AA&Ox3  Speech/Language: speech is with some dysarthria  Cranial Nerves:  II: PERRL.  III, IV, VI: EOMI. Eyelids elevate symmetrically.  V: Sensation is  intact to light touch and symmetrical to face.  VII: Left facial droop present VIII: hearing intact to voice. IX, X: Voice is dysarthric XII: tongue is midline without fasciculations. Motor: 5/5 strength to bilateral upper extremities and right lower extremity, 4+ out of 5 strength to left lower extremity Tone: is normal and bulk is normal Sensation- Intact to light touch bilaterally.  Gait- deferred   ASSESSMENT/PLAN [name] is a 76 y.o. female with history of multiple prior strokes, hypertension, arthritis, hyperlipidemia, osteomyelitis, PVD, diabetes, esophagitis and anxiety presenting after being found down at home with dysarthria and left-sided facial droop. The day before yesterday, family noted that she seemed unsteady and had slurred speech.  Yesterday, she was found on the floor in her house and was brought to the emergency department.  She was noted to have a left facial droop and left upper visual field cut.  Blood pressure was elevated with systolics in the 737T, and CT head revealed trace subarachnoid hemorrhage at the right vertex.  MRI demonstrated DWI artifact at right vertex and area of subarachnoid hemorrhage with scattered small acute right MCA territory infarcts.  Stroke:  right MCA small scattered infarcts with trace right frontal subarachnoid  hemorrhage Etiology: Likely embolic Code Stroke CT head trace subarachnoid hemorrhage at right vertex CTA head & neck no LVO, high-grade stenosis at origin of right vertebral artery, high-grade stenosis of distal right V4, bilateral P2 moderate stenosis, mild narrowing of distal M1 segments bilaterally MRI stable trace subarachnoid hemorrhage at right vertex with mild DWI artifact there, also scattered small acute right MCA territory infarcts 2D Echo EF 55 to 40%, grade 2 diastolic dysfunction, mildly dilated left atrium, normal atrial septum LDL 254 HgbA1c 6.4 Will consider loop recorder versus 30-day cardiac monitor to  rule out A-fib VTE prophylaxis -SCDs    Diet   DIET DYS 3 Room service appropriate? Yes with Assist; Fluid consistency: Thin   clopidogrel 75 mg daily prior to admission, now on No antithrombotic secondary to subarachnoid hemorrhage  Therapy recommendations: CIR Disposition: Pending  Hypertension Home meds: Indapamide 2.5 mg daily, amlodipine 10 mg once daily, carvedilol 6.25 mg daily, enalapril 20 mg at bedtime Start amlodipine 10 mg daily and carvedilol 6.25 mg daily as well as enalapril 20 mg at bedtime Stable off Cleviprex Keep systolic blood pressure less than 160 Long-term BP goal normotensive  Hyperlipidemia Home meds: Ezetimibe 10 mg daily, resumed in hospital LDL 254, goal < 70 Add rosuvastatin 40 mg daily Consider referral to the lipid clinic if high intensity statin does not lower LDL to goal Continue statin at discharge  Diabetes type II Controlled Home meds: None HgbA1c 6.4, goal < 7.0 CBGs Recent Labs    05/16/22 2146 05/17/22 0753 05/17/22 1205  GLUCAP 133* 128* 125*    SSI  Other Stroke Risk Factors Advanced Age >/= 20  Former cigarette smoker  Other Active Problems Diarrhea Imodium as needed Placed patient on enteric contact precautions and test for C. difficile as she was on Dificid at home  Hospital day # 2 Patient presented with slurred speech and left-sided weakness and CT scan shows small localized right posterior frontal subarachnoid hemorrhage but MRI shows multiple small scattered MCA branch infarcts likely of embolic etiology.  Continue close neurological monitoring and strict blood pressure control with systolic goal  below 814.  Wean off Cleviprex drip and use oral blood pressure medications as well as as needed IV labetalol and hydralazine.  Hold antiplatelet agents for now.  She may need prolonged cardiac monitor at discharge to look for paroxysmal A-fib.[place] Kitchen  No family available at the bedside for discussion.plan to transfer out of ICU when  she is weaned off Cleviprex drip.  This patient is critically ill and at significant risk of neurological worsening, death and care requires constant monitoring of vital signs, hemodynamics,respiratory and cardiac monitoring, extensive review of multiple databases, frequent neurological assessment, discussion with family, other specialists and medical decision making of high complexity.I have made any additions or clarifications directly to the above note.This critical care time does not reflect procedure time, or teaching time or supervisory time of PA/NP/Med Resident etc but could involve care discussion time.  I spent 32  minutes of neurocritical care time  in the care of  this patient.      Antony Contras, MD Medical Director [facility] Pager: [phone] 05/17/2022 3:37 PM  To contact Stroke Continuity provider, please refer to [email or web address] After hours, contact General Neurology

## 2022-05-17 NOTE — Discharge Summary (Shared)
Stroke Discharge Summary  Patient ID: Lauren Craig   MRN: [id number]      DOB: February 25, 1946  Date of Admission: 05/15/2022 Date of Discharge: 05/18/2022  Attending Physician:  Stroke, Md, MD, Stroke MD Consultant(s):    None  Patient's PCP:  Martyn Malay, MD  DISCHARGE DIAGNOSIS: Scattered right MCA infarcts, likely embolic Principal Problem:   ICH (intracerebral hemorrhage) ([place])   Allergies as of 05/18/2022       Reactions   Penicillins Hives, Rash, Other (See Comments)   Did it involve swelling of the face/tongue/throat, SOB, or low BP? Yes Did it involve sudden or severe rash/hives, skin peeling, or any reaction on the inside of your mouth or nose? No Did you need to seek medical attention at a hospital or doctor's office? Yes When did it last happen? ? If all above answers are "NO", may proceed with cephalosporin use.   Insulin Lispro Itching, Other (See Comments)   Site of injection was inflamed, red, painful episode   Insulin Lispro Prot & Lispro Other (See Comments)   Site of injection was inflamed, red, painful episode Humalog Mix   Semaglutide Nausea And Vomiting   Metformin Diarrhea     Med Rec must be completed prior to using this SMARTLINK***       LABORATORY STUDIES CBC    Component Value Date/Time   WBC 6.8 05/16/2022 0624   RBC 3.76 (L) 05/16/2022 0624   HGB 10.1 (L) 05/16/2022 0624   HGB 12.1 12/17/2021 1458   HCT 30.8 (L) 05/16/2022 0624   HCT 36.1 12/17/2021 1458   PLT 213 05/16/2022 0624   PLT 288 12/17/2021 1458   MCV 81.9 05/16/2022 0624   MCV 80 12/17/2021 1458   MCH 26.9 05/16/2022 0624   MCHC 32.8 05/16/2022 0624   RDW 15.9 (H) 05/16/2022 0624   RDW 14.9 12/17/2021 1458   LYMPHSABS 1.0 05/15/2022 1213   LYMPHSABS 1.6 06/07/2021 1140   MONOABS 0.4 05/15/2022 1213   EOSABS 0.0 05/15/2022 1213   EOSABS 0.2 06/07/2021 1140   BASOSABS 0.0 05/15/2022 1213   BASOSABS 0.0 06/07/2021 1140   CMP    Component Value Date/Time    NA 136 05/18/2022 0405   NA 140 11/05/2021 1012   K 4.6 05/18/2022 0405   CL 109 05/18/2022 0405   CO2 16 (L) 05/18/2022 0405   GLUCOSE 120 (H) 05/18/2022 0405   BUN 50 (H) 05/18/2022 0405   BUN 24 11/05/2021 1012   CREATININE 3.37 (H) 05/18/2022 0405   CALCIUM 8.5 (L) 05/18/2022 0405   PROT 6.9 05/15/2022 1213   PROT 6.4 06/07/2021 1140   ALBUMIN 3.6 05/15/2022 1213   ALBUMIN 4.2 06/07/2021 1140   AST 22 05/15/2022 1213   ALT 14 05/15/2022 1213   ALKPHOS 79 05/15/2022 1213   BILITOT 0.8 05/15/2022 1213   BILITOT 0.4 06/07/2021 1140   GFRNONAA 14 (L) 05/18/2022 0405   GFRAA >60 11/11/2019 1200   COAGS Lab Results  Component Value Date   INR 1.1 05/15/2022   INR 0.9 12/17/2020   Lipid Panel    Component Value Date/Time   CHOL 324 (H) 05/15/2022 1256   CHOL 180 06/07/2021 1140   TRIG 102 05/15/2022 1256   HDL 50 05/15/2022 1256   HDL 49 06/07/2021 1140   CHOLHDL 6.5 05/15/2022 1256   VLDL 20 05/15/2022 1256   LDLCALC 254 (H) 05/15/2022 1256   LDLCALC 108 (H) 06/07/2021 1140   HgbA1C  Lab  Results  Component Value Date   HGBA1C 6.4 (H) 05/15/2022   Urinalysis    Component Value Date/Time   COLORURINE STRAW (A) 12/17/2020 1835   APPEARANCEUR CLEAR 12/17/2020 1835   LABSPEC 1.011 12/17/2020 1835   PHURINE 9.0 (H) 12/17/2020 1835   GLUCOSEU 50 (A) 12/17/2020 1835   HGBUR NEGATIVE 12/17/2020 1835   BILIRUBINUR NEGATIVE 12/17/2020 [place] 12/17/2020 1835   PROTEINUR 100 (A) 12/17/2020 1835   NITRITE NEGATIVE 12/17/2020 1835   LEUKOCYTESUR NEGATIVE 12/17/2020 1835   Urine Drug Screen No results found for: "LABOPIA", "COCAINSCRNUR", "LABBENZ", "AMPHETMU", "THCU", "LABBARB"  Alcohol Level    Component Value Date/Time   ETH <10 05/15/2022 1215     SIGNIFICANT DIAGNOSTIC STUDIES EEG adult  Result Date: 05/16/2022 Lauren Havens, MD     05/16/2022  3:57 PM Patient Name: Lauren Craig MRN: [id number] Epilepsy Attending: Lora Craig  Referring Physician/Provider: Katy Apo, NP Date: 05/16/2022 Duration: 21.46 mins Patient history: 76 y.o. 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 She presents to the ED c/o slurred speech and weakness. CT shows a trace subarachnoid hemorrhage. EEG to evaluate for seizure. Level of alertness: Awake AEDs during EEG study: None Technical aspects: This EEG study was done with scalp electrodes positioned according to the 10-20 International system of electrode placement. Electrical activity was reviewed with band pass filter of 1-70Hz , sensitivity of 7 uV/mm, display speed of 32mm/sec with a 60Hz  notched filter applied as appropriate. EEG data were recorded continuously and digitally stored.  Video monitoring was available and reviewed as appropriate. Description: The posterior dominant rhythm consists of 7.5 Hz activity of moderate voltage (25-35 uV) seen predominantly in posterior head regions, asymmetric (right <left) and reactive to eye opening and eye closing.  EEG showed continuous 3 to 5 Hz 11 delta slowing in right hemisphere as well as intermittent generalized 3 to 6 Hz theta-delta slowing. Hyperventilation and photic stimulation were not performed.   ABNORMALITY - Continuous slow, right hemisphere - Intermittent slow, generalized - Background asymmetry, right< left IMPRESSION: This study is suggestive of cortical dysfunction in right hemisphere likely secondary to underlying structural and normality. Additionally there is mild diffuse encephalopathy, nonspecific etiology. No seizures or epileptiform discharges were seen throughout the recording. Lauren Craig   DG Swallowing Func-Speech Pathology  Result Date: 05/16/2022 Table formatting from the original result was not included. Objective Swallowing Evaluation: Type of Study: MBS-Modified Barium Swallow Study  Patient Details Name:  Lauren Craig MRN: [id number] Date of Birth: 25-Oct-1945 Today's Date: 05/16/2022 Time: SLP Start Time (ACUTE ONLY): 63 -SLP Stop Time (ACUTE ONLY): 2440 SLP Time Calculation (min) (ACUTE ONLY): 12 min Past Medical History: Past Medical History: Diagnosis Date  Anxiety disorder   Arthritis   hands, hips  C. 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  Vitamin B12 deficiency  Past Surgical History: Past Surgical History: Procedure Laterality Date  ABDOMINAL AORTOGRAM W/LOWER EXTREMITY N/A 08/11/2017  Procedure: ABDOMINAL AORTOGRAM W/LOWER EXTREMITY;  Surgeon: Angelia Mould, MD;  Location: [facility];  Service: Cardiovascular;  Laterality: N/A;  ABDOMINAL HYSTERECTOMY    AMPUTATION Left 11/14/2019  Procedure: Left Fifth  ray amputation;  Surgeon: Wylene Simmer, MD;  Location: [facility];  Service: Orthopedics;  Laterality: Left;  AMPUTATION TOE Left 05/17/2018  Procedure: 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;  Surgeon: Wylene Simmer, MD;  Location: [facility];  Service: Orthopedics;  Laterality: Left;  BIOPSY  10/29/2020  Procedure: BIOPSY;  Surgeon: Irene Shipper, MD;  Location: [facility] ENDOSCOPY;  Service: Endoscopy;;  ESOPHAGOGASTRODUODENOSCOPY (EGD) WITH PROPOFOL N/A 10/29/2020  Procedure: ESOPHAGOGASTRODUODENOSCOPY (EGD) WITH PROPOFOL;  Surgeon: Irene Shipper, MD;  Location: [facility] ENDOSCOPY;  Service: Endoscopy;  Laterality: N/A;  PARTIAL HYMENECTOMY  1968  TONSILLECTOMY AND ADENOIDECTOMY    VAGINAL HYSTERECTOMY  1987 HPI: Pt is a 76 yo female presenting with slurred speech and weakness after unwitnessed fall. CT shows a trace SAH at the R vertex. MRI shows stable SAH at the R vertex with mild DWI infarct there as well as strong evidence of superimposed scattered small  acute R MCA territory infarcts, including corona radiata and anterior insula. PMH includes: multiple prior strokes (11/2020 R ACA infarcts; evaluated by SLP and found to have moderate memory deficits and unilateral UMN dysarthria, MBS with silent aspiration x1 with thin liquids only while swallowing pill), esophagitis, anxiety, HTN, arthritis, HLD, osteomyelitis, PVD, DM type 2  Subjective: says her speech is the biggest thing that is different  Recommendations for follow up therapy are one component of a multi-disciplinary discharge planning process, led by the attending physician.  Recommendations may be updated based on patient status, additional functional criteria and insurance authorization. Assessment / Plan / Recommendation   05/16/2022   1:00 PM Clinical Impressions Clinical Impression Pt has mild oropharyngeal dysphagia noted during this MBS, although with limited ability to challenge her fully. Despite cues to take large, consecutive boluses, she takes very small and single bites/sips at a time due to nausea. When prompted to take as big of a sip as possible, she does start to have penetration of thin liquids due to impaired timing and coordination. Penetrates clear spontaneously (PAS 2), although there may still be concern for aspiration correlated with coughing events noted at bedside when pt was drinking more impulsively. [name] oral phase is slow but also without the anterior loss observed with larger bites/sips at bedside. Recommend starting with Dys 3 diet and thin liquids, avoiding mixed consistencies. Would encourage smaller sips as well as performance on MBS suggests adequate function to handle at least smaller volumes. SLP Visit Diagnosis Dysphagia, oropharyngeal phase (R13.12) Impact on safety and function Mild aspiration risk     05/16/2022   1:00 PM Treatment Recommendations Treatment Recommendations Therapy as outlined in treatment plan below     05/16/2022   1:00 PM Prognosis Prognosis for  Safe Diet Advancement Good   05/16/2022   1:00 PM Diet Recommendations SLP Diet Recommendations Dysphagia 3 (Mech soft) solids;Thin liquid;Other (Comment) Liquid Administration via Cup;Straw Medication Administration Whole meds with puree Compensations Slow rate;Small sips/bites Postural Changes Seated upright at 90 degrees     05/16/2022   1:00 PM Other Recommendations Oral Care Recommendations Oral care BID Follow Up Recommendations Acute inpatient rehab (3hours/day) Functional Status Assessment Patient has had a recent decline in their functional status and demonstrates the ability to make significant improvements in function in a reasonable and predictable amount of time.   05/16/2022   1:00 PM Frequency and Duration  Speech Therapy Frequency (ACUTE ONLY) min 2x/week Treatment Duration 2 weeks     05/16/2022   1:00 PM Oral Phase Oral Phase Impaired Oral -  Thin Teaspoon Reduced posterior propulsion Oral - Thin Cup Reduced posterior propulsion Oral - Thin Straw Reduced posterior propulsion Oral - Puree Reduced posterior propulsion Oral - Regular Reduced posterior propulsion    05/16/2022   1:00 PM Pharyngeal Phase Pharyngeal Phase Impaired Pharyngeal- Thin Teaspoon Delayed swallow initiation-pyriform sinuses Pharyngeal- Thin Cup Delayed swallow initiation-pyriform sinuses;Penetration/Aspiration before swallow Pharyngeal Material enters airway, remains ABOVE vocal cords then ejected out Pharyngeal- Thin Straw Pharyngeal residue - valleculae;Pharyngeal residue - pyriform;Delayed swallow initiation-pyriform sinuses;Penetration/Aspiration before swallow Pharyngeal Material enters airway, remains ABOVE vocal cords then ejected out Pharyngeal- Puree Delayed swallow initiation-vallecula Pharyngeal- Regular Delayed swallow initiation-vallecula    05/16/2022   1:00 PM Cervical Esophageal Phase  Cervical Esophageal Phase [facility] Osie Bond., M.A. [place] Office [phone] Secure chat preferred  05/16/2022, 1:55 PM                     ECHOCARDIOGRAM COMPLETE  Result Date: 05/16/2022    ECHOCARDIOGRAM REPORT   Patient Name:   LEIDY MASSAR Date of Exam: 05/16/2022 Medical Rec #:  [id number]      Height:       64.0 in Accession #:    [id number]     Weight:       159.4 lb Date of Birth:  10/16/1945      BSA:          1.776 m Patient Age:    55 years       BP:           140/85 mmHg Patient Gender: F              HR:           72 bpm. Exam Location:  Inpatient Procedure: 2D Echo Indications:    stroke  History:        Patient has prior history of Echocardiogram examinations, most                 recent 12/19/2020. Risk Factors:Hypertension, Diabetes and                 Dyslipidemia.  Sonographer:    Harvie Junior Referring Phys: [id number] Otelia Santee  Sonographer Comments: Technically difficult study due to poor echo windows, suboptimal subcostal window and suboptimal apical window. Image acquisition challenging due to respiratory motion. IMPRESSIONS  1. Left ventricular ejection fraction, by estimation, is 55 to 60%. The left ventricle has normal function. The left ventricle has no regional wall motion abnormalities. There is mild asymmetric left ventricular hypertrophy of the basal-septal segment. Left ventricular diastolic parameters are consistent with Grade II diastolic dysfunction (pseudonormalization).  2. Right ventricular systolic function is normal. The right ventricular size is normal. There is normal pulmonary artery systolic pressure. The estimated right ventricular systolic pressure is 70.6 mmHg.  3. Left atrial size was mildly dilated.  4. The mitral valve is degenerative. Trivial mitral valve regurgitation. No evidence of mitral stenosis.  5. The aortic valve is tricuspid. Aortic valve regurgitation is not visualized. Aortic valve sclerosis/calcification is present, without any evidence of aortic stenosis.  6. The inferior vena cava is normal in size with greater than 50% respiratory  variability, suggesting right atrial pressure of 3 mmHg. Comparison(s): No significant change from prior study. Conclusion(s)/Recommendation(s): No intracardiac source of embolism detected on this transthoracic study. Consider a transesophageal echocardiogram to exclude cardiac source of embolism if clinically indicated. FINDINGS  Left Ventricle: Left ventricular ejection fraction, by estimation, is 55 to  60%. The left ventricle has normal function. The left ventricle has no regional wall motion abnormalities. The left ventricular internal cavity size was normal in size. There is  mild asymmetric left ventricular hypertrophy of the basal-septal segment. Left ventricular diastolic parameters are consistent with Grade II diastolic dysfunction (pseudonormalization). Right Ventricle: The right ventricular size is normal. No increase in right ventricular wall thickness. Right ventricular systolic function is normal. There is normal pulmonary artery systolic pressure. The tricuspid regurgitant velocity is 1.70 m/s, and  with an assumed right atrial pressure of 3 mmHg, the estimated right ventricular systolic pressure is 97.3 mmHg. Left Atrium: Left atrial size was mildly dilated. Right Atrium: Right atrial size was normal in size. Pericardium: Trivial pericardial effusion is present. Presence of epicardial fat layer. Mitral Valve: The mitral valve is degenerative in appearance. Mild to moderate mitral annular calcification. Trivial mitral valve regurgitation. No evidence of mitral valve stenosis. Tricuspid Valve: The tricuspid valve is grossly normal. Tricuspid valve regurgitation is trivial. No evidence of tricuspid stenosis. Aortic Valve: The aortic valve is tricuspid. Aortic valve regurgitation is not visualized. Aortic valve sclerosis/calcification is present, without any evidence of aortic stenosis. Aortic valve mean gradient measures 8.4 mmHg. Aortic valve peak gradient measures 15.7 mmHg. Aortic valve area, by VTI  measures 2.08 cm. Pulmonic Valve: The pulmonic valve was grossly normal. Pulmonic valve regurgitation is not visualized. No evidence of pulmonic stenosis. Aorta: The aortic root and ascending aorta are structurally normal, with no evidence of dilitation. Venous: The inferior vena cava is normal in size with greater than 50% respiratory variability, suggesting right atrial pressure of 3 mmHg. IAS/Shunts: The atrial septum is grossly normal.  LEFT VENTRICLE PLAX 2D LVIDd:         3.70 cm      Diastology LVIDs:         2.60 cm      LV e' medial:    5.59 cm/s LV PW:         1.00 cm      LV E/e' medial:  19.3 LV IVS:        1.20 cm      LV e' lateral:   7.62 cm/s LVOT diam:     2.10 cm      LV E/e' lateral: 14.2 LV SV:         80 LV SV Index:   45 LVOT Area:     3.46 cm  LV Volumes (MOD) LV vol d, MOD A2C: 60.2 ml LV vol d, MOD A4C: 113.0 ml LV vol s, MOD A2C: 19.4 ml LV vol s, MOD A4C: 46.8 ml LV SV MOD A2C:     40.8 ml LV SV MOD A4C:     113.0 ml LV SV MOD BP:      59.7 ml RIGHT VENTRICLE RV S prime:     16.80 cm/s TAPSE (M-mode): 1.8 cm LEFT ATRIUM           Index LA diam:      3.40 cm 1.91 cm/m LA Vol (A4C): 90.9 ml 51.17 ml/m  AORTIC VALVE                     PULMONIC VALVE AV Area (Vmax):    1.58 cm      PV Vmax:       1.18 m/s AV Area (Vmean):   1.69 cm      PV Peak grad:  5.6 mmHg AV Area (VTI):  2.08 cm AV Vmax:           198.00 cm/s AV Vmean:          133.400 cm/s AV VTI:            0.387 m AV Peak Grad:      15.7 mmHg AV Mean Grad:      8.4 mmHg LVOT Vmax:         90.50 cm/s LVOT Vmean:        64.900 cm/s LVOT VTI:          0.232 m LVOT/AV VTI ratio: 0.60  AORTA Ao Root diam: 3.10 cm Ao Asc diam:  2.90 cm MITRAL VALVE                TRICUSPID VALVE MV Area (PHT): 3.72 cm     TR Peak grad:   11.6 mmHg MV Decel Time: 204 msec     TR Vmax:        170.00 cm/s MV E velocity: 108.00 cm/s MV A velocity: 122.00 cm/s  SHUNTS MV E/A ratio:  0.89         Systemic VTI:  0.23 m                              Systemic Diam: 2.10 cm Eleonore Chiquito MD Electronically signed by Eleonore Chiquito MD Signature Date/Time: 05/16/2022/11:52:30 AM    Final    MR BRAIN WO CONTRAST  Result Date: 05/16/2022 CLINICAL DATA:  76 year old female neurologic deficit presenting yesterday after fall. Trace vertex subarachnoid hemorrhage. Atherosclerotic stenoses on CTA. EXAM: MRI HEAD WITHOUT CONTRAST TECHNIQUE: Multiplanar, multiecho pulse sequences of the brain and surrounding structures were obtained without intravenous contrast. COMPARISON:  CT head, CTA head and neck yesterday. Brain MRI 12/18/2020. FINDINGS: Brain: Scattered small areas of restricted diffusion in the right MCA territory, including pre motor cortex, right corona radiata (series 2, image 36) and anterior insula. Abnormal diffusion at the right superior motor strip/central sulcus is probably artifact from known subarachnoid hemorrhage there (see FLAIR series 7, image 22). No intraventricular or other extra-axial hemorrhage identified. No contralateral left hemisphere or posterior fossa restricted diffusion. Mild cytotoxic edema at the acutely affected areas. No mass effect. 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 Superimposed chronic brainstem microhemorrhages. Stable overall cerebral volume from last year. No midline shift, mass effect, evidence of mass lesion, ventriculomegaly. Cervicomedullary junction and pituitary are within normal limits. Vascular: Major intracranial vascular flow voids appear stable from last year. Skull and upper cervical spine: Visible cervical spine appears stable, negative for age. Visualized bone marrow signal is within normal limits. Sinuses/Orbits: Stable. Other: Mild chronic mastoid effusions appear stable. Negative visible nasopharynx. IMPRESSION: 1. Stable trace SAH at the right vertex with mild DWI artifact there. But strong evidence of superimposed  scattered small Acute Right MCA territory infarcts, including corona radiata and anterior insula. No hemorrhagic transformation or mass effect. 2. Underlying advanced chronic small vessel disease. Electronically Signed   By: Genevie Ann M.D.   On: 05/16/2022 09:06   CT ANGIO HEAD NECK W WO CM (CODE STROKE)  Result Date: 05/15/2022 CLINICAL DATA:  Neuro deficit, acute, stroke suspected. Slurred speech and left-sided facial droop. Patient fell last night and was down for proximally 9 hours. EXAM: CT ANGIOGRAPHY HEAD AND NECK TECHNIQUE: Multidetector CT imaging of the head and neck was performed using the  standard protocol during bolus administration of intravenous contrast. Multiplanar CT image reconstructions and MIPs were obtained to evaluate the vascular anatomy. Carotid stenosis measurements (when applicable) are obtained utilizing NASCET criteria, using the distal internal carotid diameter as the denominator. RADIATION DOSE REDUCTION: This exam was performed according to the departmental dose-optimization program which includes automated exposure control, adjustment of the mA and/or kV according to patient size and/or use of iterative reconstruction technique. CONTRAST:  101mL OMNIPAQUE IOHEXOL 350 MG/ML SOLN COMPARISON:  CT head without contrast 05/15/2022 FINDINGS: CTA NECK FINDINGS Aortic arch: Atherosclerotic calcifications are present at the origins the great vessels without focal stenosis or aneurysm. The left vertebral artery originates directly from the arch, a normal variant. Right carotid system: The right common carotid artery is within normal limits. Calcifications are present the bifurcation without a significant stenosis. Cervical right ICA is within normal limits. Moderate patient motion is present just above the level of the scratched at moderate patient motion is present in the neck. Left carotid system: The left common carotid artery is within normal limits. Atherosclerotic changes are present  at the bifurcation without a significant stenosis. The cervical left ICA is otherwise within normal limits. Vertebral arteries: The right vertebral artery is noted from the subclavian artery. A high-grade near occlusive stenosis is present at the origin of the right vertebral artery. The left vertebral artery is dominant. No other focal stenoses are present in either vertebral artery in the neck. Skeleton: Vertebral body heights are normal. 3 mm degenerative anterolisthesis present C4-5. Straightening of the normal cervical lordosis is present. No focal osseous lesions are present. Other neck: Soft tissues the neck are otherwise unremarkable. Salivary glands are within normal limits. Thyroid is normal. No significant adenopathy is present. No focal mucosal or submucosal lesions are present. Upper chest: Lung apices are clear. The thoracic inlet is within normal limits. Review of the MIP images confirms the above findings CTA HEAD FINDINGS Anterior circulation: Atherosclerotic calcifications are present within the cavernous internal carotid arteries bilaterally. A 50% stenosis is present within the cavernous right ICA. No other significant stenosis is present relative to the more distal vessel. The ICA termini are within normal limits bilaterally. The A1 and M1 segments are normal. The anterior communicating artery is patent. Mild narrowing is again seen within the distal M1 segments bilaterally. MCA bifurcations are intact. Segmental narrowing is again seen throughout ACA and MCA branch vessels without a significant proximal stenosis or occlusion. Posterior circulation: The left vertebral artery is the dominant vessel. A high-grade stenosis is present distal right V4 segment. Vertebrobasilar junction and basilar artery normal. Left posterior cerebral artery originates from basilar tip. The right posterior cerebral artery is of fetal type. Moderate P2 segment stenoses are present bilaterally, as previously seen.  Venous sinuses: The dural sinuses are patent. The straight sinus and deep cerebral veins are intact. Cortical veins are within normal limits. No significant vascular malformation is evident. Anatomic variants: Fetal type right posterior cerebral artery. Review of the MIP images confirms the above findings IMPRESSION: 1. No emergent large vessel occlusion. 2. 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 3. 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 4. Moderate P2 segment stenoses bilaterally, as previously seen. 5. Mild narrowing of the distal M1 segments bilaterally. 6. Segmental narrowing branch vessels of the circle-of-Willis without a proximal stenosis or occlusion. Findings are similar the prior MRI, consistent with diffuse intracranial atherosclerosis. 7. Atherosclerotic changes at the carotid bifurcations and cavernous  internal carotid arteries bilaterally without significant stenosis. 8.  Aortic Atherosclerosis (ICD10-I70.0). Electronically Signed   By: San Morelle M.D.   On: 05/15/2022 13:11   CT HEAD CODE STROKE WO CONTRAST  Result Date: 05/15/2022 CLINICAL DATA:  Code stroke. 76 year old female. History of chronic infarcts. Intracranial atherosclerosis. 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 Presenting with slurred speech, left-sided facial droop. Fell last night, was down for about 9 hours. EXAM: CT HEAD WITHOUT CONTRAST TECHNIQUE: Contiguous axial images were obtained from the base of the skull through the vertex without intravenous contrast. RADIATION DOSE REDUCTION: This exam was performed according to the departmental dose-optimization program which includes automated exposure control, adjustment of the mA and/or kV according to patient size and/or use of iterative reconstruction technique. COMPARISON:  Head CT and brain MRI 12/17/2020, and earlier. FINDINGS: Brain: Small volume subarachnoid hemorrhage at the right  vertex, single central or precentral sulcus involvement on series 2, image 25. No intraventricular hemorrhage. No ventriculomegaly. Stable cerebral volume. No midline shift, mass effect, or evidence of intracranial mass lesion. Patchy and confluent bilateral cerebral white matter hypodensity. Chronic appearing encephalomalacia in the left corona radiata and lentiform. No cortically based acute infarct identified. Vascular: Extensive Calcified atherosclerosis at the skull base. No suspicious intracranial vascular hyperdensity. Skull: No acute osseous abnormality identified. Sinuses/Orbits: Visualized paranasal sinuses and mastoids are stable and well aerated. Other: No orbit or scalp soft tissue injury identified. ASPECTS [facility] Stroke Program Early CT Score) Total score (0-10 with 10 being normal): 10, although also positive for trace subarachnoid hemorrhage. IMPRESSION: 1. Trace subarachnoid hemorrhage at the right vertex (single sulcus involvement). This is likely posttraumatic given history of fall and found down. 2. No superimposed acute cortically based infarct identified. 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 3. These results were communicated to Dr. Cheral Marker at 12:40 pm on 05/15/2022 by text page via the [facility] messaging system. Electronically Signed   By: Genevie Ann M.D.   On: 05/15/2022 12:41      HISTORY OF PRESENT ILLNESS 76 year old patient with history of multiple prior strokes, hypertension, hyperlipidemia, arthritis, osteomyelitis, PVD, diabetes, esophagitis and anxiety presented after being found down at home with dysphagia and left-sided facial droop.  HOSPITAL COURSE Patient was found on CT head to have trace subarachnoid hemorrhage at right vertex.  However, MRI revealed this hemorrhage as well as a DWI artifact in that location as well as scattered right MCA territory infarcts, embolic appearing.  She was observed in the ICU, and blood pressure was tightly controlled.  As her  strokes look embolic, consideration was given to loop recorder implantation.  Stroke:  right MCA small scattered infarcts Etiology: Likely embolic Code Stroke CT head trace subarachnoid hemorrhage at right vertex CTA head & neck no LVO, high-grade stenosis at origin of right vertebral artery, high-grade stenosis of distal right V4, bilateral P2 moderate stenosis, mild narrowing of distal M1 segments bilaterally MRI stable trace subarachnoid hemorrhage at right vertex with mild DWI artifact there, also scattered small acute right MCA territory infarcts 2D Echo EF 55 to 16%, grade 2 diastolic dysfunction, mildly dilated left atrium, normal atrial septum LDL 254 HgbA1c 6.4 Will consider loop recorder versus 30-day cardiac monitor to rule out A-fib VTE prophylaxis -SCDs       Diet    DIET DYS 3 Room service appropriate? Yes with Assist; Fluid consistency: Thin        clopidogrel 75 mg daily prior to admission, now on No antithrombotic  secondary to subarachnoid hemorrhage  Therapy recommendations: CIR Disposition: Pending   Hypertension Home meds: Indapamide 2.5 mg daily, amlodipine 10 mg once daily, carvedilol 6.25 mg daily, enalapril 20 mg at bedtime Start amlodipine 10 mg daily and carvedilol 6.25 mg daily as well as enalapril 20 mg at bedtime Stable off Cleviprex Keep systolic blood pressure less than 160 Long-term BP goal normotensive   Hyperlipidemia Home meds: Ezetimibe 10 mg daily, resumed in hospital LDL 254, goal < 70 Add rosuvastatin 40 mg daily Consider referral to the lipid clinic if high intensity statin does not lower LDL to goal Continue statin at discharge   Diabetes type II Controlled Home meds: None HgbA1c 6.4, goal < 7.0 CBGs Recent Labs (last 2 labs)      Recent Labs    05/15/22 1211 05/16/22 1135  GLUCAP 152* 133*      SSI   Other Stroke Risk Factors Advanced Age >/= 74  Former cigarette smoker   Other Active Problems Diarrhea Imodium as  needed Placed patient on enteric contact precautions and test for C. difficile as she was on Dificid at home   DISCHARGE EXAM Blood pressure (!) 179/61, pulse (!) 55, temperature 97.8 F (36.6 C), temperature source Oral, resp. rate 12, SpO2 94 %. General: Alert, well-developed well-nourished elderly patient in no acute distress Respiratory: Regular, unlabored respirations on room air   NEURO:  Mental Status: AA&Ox3  Speech/Language: speech is with some dysarthria   Cranial Nerves:  II: PERRL.  III, IV, VI: EOMI. Eyelids elevate symmetrically.  V: Sensation is intact to light touch and symmetrical to face.  VII: Left facial droop present VIII: hearing intact to voice. IX, X: Voice is dysarthric XII: tongue is midline without fasciculations. Motor: 5/5 strength to bilateral upper extremities and right lower extremity, 4+ out of 5 strength to left lower extremity Tone: is normal and bulk is normal Sensation- Intact to light touch bilaterally.  Gait- deferred    Discharge Diet       Diet   DIET DYS 3 Room service appropriate? Yes with Assist; Fluid consistency: Thin   liquids  DISCHARGE PLAN Disposition: Home aspirin 81 mg daily for secondary stroke prevention Ongoing stroke risk factor control by Primary Care Physician at time of discharge Follow-up PCP Martyn Malay, MD in 2 weeks. Follow-up in [facility] with NP in 8 weeks, office to schedule an appointment.   30 minutes were spent preparing discharge.  ***

## 2022-05-18 DIAGNOSIS — I611 Nontraumatic intracerebral hemorrhage in hemisphere, cortical: Secondary | ICD-10-CM | POA: Diagnosis not present

## 2022-05-18 LAB — BASIC METABOLIC PANEL
Anion gap: 11 (ref 5–15)
BUN: 50 mg/dL — ABNORMAL HIGH (ref 8–23)
CO2: 16 mmol/L — ABNORMAL LOW (ref 22–32)
Calcium: 8.5 mg/dL — ABNORMAL LOW (ref 8.9–10.3)
Chloride: 109 mmol/L (ref 98–111)
Creatinine, Ser: 3.37 mg/dL — ABNORMAL HIGH (ref 0.44–1.00)
GFR, Estimated: 14 mL/min — ABNORMAL LOW (ref >60)
Glucose, Bld: 120 mg/dL — ABNORMAL HIGH (ref 70–99)
Potassium: 4.6 mmol/L (ref 3.5–5.1)
Sodium: 136 mmol/L (ref 135–145)

## 2022-05-18 LAB — MAGNESIUM: Magnesium: 1.9 mg/dL (ref 1.7–2.4)

## 2022-05-18 MED ORDER — SODIUM CHLORIDE 0.9 % IV BOLUS
500.0000 mL | Freq: Once | INTRAVENOUS | Status: AC
  Administered 2022-05-18: 500 mL via INTRAVENOUS

## 2022-05-18 MED ORDER — HYDRALAZINE HCL 20 MG/ML IJ SOLN
10.0000 mg | INTRAMUSCULAR | Status: DC | PRN
  Administered 2022-05-18 – 2022-05-30 (×9): 10 mg via INTRAVENOUS
  Filled 2022-05-18 (×9): qty 1

## 2022-05-18 MED ORDER — LABETALOL HCL 5 MG/ML IV SOLN
10.0000 mg | INTRAVENOUS | Status: DC | PRN
  Administered 2022-05-18 – 2022-05-25 (×8): 10 mg via INTRAVENOUS
  Filled 2022-05-18 (×9): qty 4

## 2022-05-18 MED ORDER — HYDRALAZINE HCL 25 MG PO TABS
25.0000 mg | ORAL_TABLET | Freq: Three times a day (TID) | ORAL | Status: DC
  Administered 2022-05-18 – 2022-05-19 (×5): 25 mg via ORAL
  Filled 2022-05-18 (×6): qty 1

## 2022-05-18 NOTE — TOC Initial Note (Signed)
Transition of Care [facility]) - Initial/Assessment Note    Patient Details  Name: Lauren Craig MRN: [id number] Date of Birth: 1945-12-30  Transition of [facility]) CM/SW Contact:    Ella Bodo, RN Phone Number: 05/18/2022, 2:41 PM  Clinical Narrative:                 Pt is a 76 y/o female presenting to the ED 12/17 with slurred speech and weakness, left facial droop and decreased vision L upper visual field. 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  PTA, patient was independent and living alone at home.  PT/OT recommending home health follow-up, and patient agreeable to services.  She states that her daughter will be assisting her with care and staying with her at discharge.  Spoke with [name], patient's daughter; she states she can provide 24-hour assistance.  She also agrees to home health follow-up; referral to [facility] home health for continued PT/OT/ST. Daughter states patient has all needed DME at home, but requests gait belt to assist with transfers/ambulation.  Message to Yvone Neu, PT regarding obtaining gait belt for home.  Planned Disposition: Home with Health Care Svc Barriers to Discharge: Continued Medical Work up   Patient Goals and CMS Choice   CMS Medicare.gov Compare Post Acute Care list provided to:: Patient Represenative (must comment) (daughter) Choice offered to / list presented to : Adult [place] ownership interest in [facility].provided to:: Adult Children    Expected Discharge Plan and Services Planned Disposition: Home with Health Care Svc   Discharge Planning Services: CM Consult Post Acute Care Choice: [place] arrangements for the past 2 months: Single Family Home                           HH Arranged: PT, OT, Speech Therapy HH Agency: [facility] Date [place]: 05/18/22 Time [place]:  [place] Representative spoke with at [place]: Gevena Barre  Prior Living Arrangements/Services Living arrangements for the past 2 months: [place] with:: Self Patient language and need for interpreter reviewed:: Yes Do you feel safe going back to the place where you live?: Yes      Need for Family Participation in Patient Care: Yes (Comment) Care giver support system in place?: Yes (comment) Current home services: DME Criminal Activity/Legal Involvement Pertinent to Current Situation/Hospitalization: No - Comment as needed  Activities of Daily Living Home Assistive Devices/Equipment: Eyeglasses, Bedside commode/3-in-1, Shower chair with back, Walker (specify type) ADL Screening (condition at time of admission) Patient's cognitive ability adequate to safely complete daily activities?: Yes Is the patient deaf or have difficulty hearing?: No Does the patient have difficulty seeing, even when wearing glasses/contacts?: No Does the patient have difficulty concentrating, remembering, or making decisions?: No Patient able to express need for assistance with ADLs?: Yes Does the patient have difficulty dressing or bathing?: Yes Independently performs ADLs?: No Communication: Independent Dressing (OT): Needs assistance Is this a change from baseline?: Change from baseline, expected to last >3 days Grooming: Needs assistance Is this a change from baseline?: Change from baseline, expected to last >3 days Feeding: Independent Bathing: Needs assistance Is this a change from baseline?: Change from baseline, expected to last >3 days Toileting: Needs assistance Is this a change from baseline?: Change from baseline, expected to last >3days Walks in Home: Needs assistance Is  this a change from baseline?: Change from baseline, expected to last >3 days Does the patient have difficulty walking or climbing stairs?: Yes Weakness of Legs: Both Weakness of Arms/Hands: Both  Permission  Sought/Granted Permission sought to share information with : Family Supports    Share Information with NAME: [place] granted to share info w AGENCY: [facility]  Permission granted to share info w Relationship: daughter  Permission granted to share info w Contact Information: [phone]  Emotional Assessment Appearance:: Appears stated age Attitude/Demeanor/Rapport: Engaged Affect (typically observed): Accepting Orientation: : Oriented to Self, Oriented to Place, Oriented to  Time, Oriented to Situation      Admission diagnosis:  ICH (intracerebral hemorrhage) ([place]) [I61.9] Patient Active Problem List   Diagnosis Date Noted   ICH (intracerebral hemorrhage) ([place]) 05/15/2022   Chronic Nausea, Vomiting, Intermittent Diarrhea 04/05/2022   Senile purpura ([place]) 02/23/2022   Memory changes 02/23/2022   Chronic kidney disease (CKD), stage III (moderate) ([place]) 08/27/2021   Arthritis 03/15/2021   Esophagitis 03/15/2021   Renal cyst 12/17/2020   Visual field defect 11/08/2020   Cerebrovascular disease 11/08/2020   HTN (hypertension) Difficult to Control with Isolated Systolic HTN  20/25/4270   Hyperlipidemia 10/03/2017   Controlled diabetes mellitus type 2 with complications ([place]) 62/37/6283   PVD (peripheral vascular disease) ([place]) 2010   Vitamin B12 deficiency 2002   PCP:  Martyn Malay, MD Pharmacy:   [facility], [place] [address] Phone: [phone] Fax: [place] [address] Phone: [phone] Fax: [phone]     Social Determinants of Health (SDOH) Social History: SDOH Screenings   Food Insecurity: Food Insecurity Present (03/21/2022)  Housing: Low Risk  (03/21/2022)  Transportation Needs: No Transportation Needs (03/21/2022)  Depression (PHQ2-9): Low Risk  (12/17/2021)  Financial  Resource Strain: Medium Risk (03/21/2022)  Social Connections: Unknown (03/22/2021)  Stress: Stress Concern Present (02/15/2021)  Tobacco Use: Medium Risk (05/15/2022)   SDOH Interventions:  Took with patient's daughter, [name], regarding potential food insecurity: Patient's daughter states that this is not an issue for patient, and they have set up ways for her to order food, if she cannot go to the store.     Readmission Risk Interventions     No data to display         Reinaldo Raddle, RN, BSN  Trauma/Neuro ICU Case Manager [phone]

## 2022-05-18 NOTE — Progress Notes (Signed)
PT Cancellation Note  Patient Details Name: Lauren Craig MRN: [id number] DOB: [birth year]   Cancelled Treatment:    Reason Eval/Treat Not Completed: Patient declined, no reason specified.  Pt just stated not today.  I'll work Architectural technologist. 05/18/2022  Ginger Carne., PT Acute Rehabilitation Services [phone]  (office)   Tessie Fass Amory Zbikowski 05/18/2022, 2:52 PM

## 2022-05-18 NOTE — Progress Notes (Addendum)
STROKE TEAM PROGRESS NOTE   INTERVAL HISTORY Patient was seen in her room with no family at the bedside.   Patient is doing much better.  She is awake alert and interactive.  Neurological and hemodynamically stable.  MRI scan of the brain yesterday showed stable trace subarachnoid hemorrhage over the right vertex with evidence of scattered acute right MCA infarcts. Cr is elevated to 3.37. NS bolus ordered, was planning to discharge, but with elevated Cr. We will transfer to the floor and plan to discharge tomorrow.  Loop prior to discharge Follow up with [facility] NP outpatient   Vitals:   05/18/22 0500 05/18/22 0600 05/18/22 0700 05/18/22 0800  BP: (!) 128/53 (!) 145/57 (!) 139/53   Pulse: (!) 56 (!) 57 (!) 55   Resp: 14 15 12    Temp:    97.8 F (36.6 C)  TempSrc:    Oral  SpO2: 94% 95% 94%    CBC:  Recent Labs  Lab 05/15/22 1213 05/15/22 1248 05/16/22 0624  WBC 10.2  --  6.8  NEUTROABS 8.8*  --   --   HGB 11.2* 11.9* 10.1*  HCT 34.5* 35.0* 30.8*  MCV 81.8  --  81.9  PLT 247  --  545    Basic Metabolic Panel:  Recent Labs  Lab 05/16/22 0624 05/18/22 0405  NA 137 136  K 5.0 4.6  CL 112* 109  CO2 16* 16*  GLUCOSE 103* 120*  BUN 43* 50*  CREATININE 2.26* 3.37*  CALCIUM 8.4* 8.5*  MG 1.8 1.9    Lipid Panel:  Recent Labs  Lab 05/15/22 1256  CHOL 324*  TRIG 102  HDL 50  CHOLHDL 6.5  VLDL 20  LDLCALC 254*    HgbA1c:  Recent Labs  Lab 05/15/22 1256  HGBA1C 6.4*    Urine Drug Screen: No results for input(s): "LABOPIA", "COCAINSCRNUR", "LABBENZ", "AMPHETMU", "THCU", "LABBARB" in the last 168 hours.  Alcohol Level  Recent Labs  Lab 05/15/22 1215  ETH <10     IMAGING past 24 hours No results found.  PHYSICAL EXAM General: Alert, well-developed well-nourished elderly patient in no acute distress Respiratory: Regular, unlabored respirations on room air  NEURO:  Mental Status: AA&Ox3  Speech/Language: speech is with some dysarthria  Cranial Nerves:   II: PERRL.  III, IV, VI: EOMI. Eyelids elevate symmetrically.  V: Sensation is intact to light touch and symmetrical to face.  VII: Left facial droop present VIII: hearing intact to voice. IX, X: Voice is dysarthric XII: tongue is midline without fasciculations. Motor: 5/5 strength to bilateral upper extremities and right lower extremity, 4+ out of 5 strength to left lower extremity Tone: is normal and bulk is normal Sensation- Intact to light touch bilaterally.  Gait- deferred   ASSESSMENT/PLAN [name] is a 76 y.o. female with history of multiple prior strokes, hypertension, arthritis, hyperlipidemia, osteomyelitis, PVD, diabetes, esophagitis and anxiety presenting after being found down at home with dysarthria and left-sided facial droop. The day before yesterday, family noted that she seemed unsteady and had slurred speech.  Yesterday, she was found on the floor in her house and was brought to the emergency department.  She was noted to have a left facial droop and left upper visual field cut.  Blood pressure was elevated with systolics in the 625W, and CT head revealed trace subarachnoid hemorrhage at the right vertex.  MRI demonstrated DWI artifact at right vertex and area of subarachnoid hemorrhage with scattered small acute right MCA territory infarcts.  Stroke:  right MCA small scattered infarcts with trace right frontal subarachnoid hemorrhage Etiology: Likely embolic Code Stroke CT head trace subarachnoid hemorrhage at right vertex CTA head & neck no LVO, high-grade stenosis at origin of right vertebral artery, high-grade stenosis of distal right V4, bilateral P2 moderate stenosis, mild narrowing of distal M1 segments bilaterally MRI stable trace subarachnoid hemorrhage at right vertex with mild DWI artifact there, also scattered small acute right MCA territory infarcts 2D Echo EF 55 to 09%, grade 2 diastolic dysfunction, mildly dilated left atrium, normal atrial  septum LDL 254 HgbA1c 6.4 Will consider loop recorder versus 30-day cardiac monitor to rule out A-fib VTE prophylaxis -SCDs    Diet   DIET DYS 3 Room service appropriate? Yes with Assist; Fluid consistency: Thin   clopidogrel 75 mg daily prior to admission, now on ASA 81mg   Therapy recommendations: Home health  Disposition: Pending  Hypertension Home meds: Indapamide 2.5 mg daily, amlodipine 10 mg once daily, carvedilol 6.25 mg daily, enalapril 20 mg at bedtime Start amlodipine 10 mg daily and carvedilol 6.25 mg daily as well as enalapril 20 mg at bedtime Stable off Cleviprex Keep systolic blood pressure less than 160 Long-term BP goal normotensive  Hyperlipidemia Home meds: Ezetimibe 10 mg daily, resumed in hospital LDL 254, goal < 70 Add rosuvastatin 40 mg daily Consider referral to the lipid clinic if high intensity statin does not lower LDL to goal Continue statin at discharge  Diabetes type II Controlled Home meds: None HgbA1c 6.4, goal < 7.0 CBGs Recent Labs    05/16/22 2146 05/17/22 0753 05/17/22 1205  GLUCAP 133* 128* 125*     SSI  Other Stroke Risk Factors Advanced Age >/= 56  Former cigarette smoker  Other Active Problems Diarrhea  Imodium as needed  Placed patient on enteric contact precautions and test for C. difficile as she was on Dificid at home AKI on CKD  Cr. 2.26-> 3.37 NS 572ml bolus Repeat [facility] day # 3   Patient seen and examined by NP/APP with MD. MD to update note as needed.   Lauren Ores, DNP, FNP-BC [facility] Pager: [phone]  I have personally obtained history,examined this patient, reviewed notes, independently viewed imaging studies, participated in medical decision making and plan of care.ROS completed by me personally and pertinent positives fully documented  I have made any additions or clarifications directly to the above note. Agree with note above.  Patient is neurologically stable  and doing well.  Therapy recommends only home therapies.  Patient's creatinine has gone up and still give him some IV hydration and repeat labs tomorrow.  Transfer to neurology floor bed today.  Will hold discharge today.  No family available at the bedside for discussion.  Greater than 50% time during this 50-minute visit was spent in counseling and coordination of care and discussion patient and care team and answering questions.  Antony Contras, MD Medical Director [facility] Pager: [phone] 05/18/2022 3:12 PM  To contact Stroke Continuity provider, please refer to [email or web address] After hours, contact General Neurology

## 2022-05-19 DIAGNOSIS — I612 Nontraumatic intracerebral hemorrhage in hemisphere, unspecified: Secondary | ICD-10-CM

## 2022-05-19 DIAGNOSIS — I611 Nontraumatic intracerebral hemorrhage in hemisphere, cortical: Secondary | ICD-10-CM | POA: Diagnosis not present

## 2022-05-19 LAB — BASIC METABOLIC PANEL
Anion gap: 10 (ref 5–15)
BUN: 55 mg/dL — ABNORMAL HIGH (ref 8–23)
CO2: 15 mmol/L — ABNORMAL LOW (ref 22–32)
Calcium: 8.3 mg/dL — ABNORMAL LOW (ref 8.9–10.3)
Chloride: 113 mmol/L — ABNORMAL HIGH (ref 98–111)
Creatinine, Ser: 4.27 mg/dL — ABNORMAL HIGH (ref 0.44–1.00)
GFR, Estimated: 10 mL/min — ABNORMAL LOW (ref >60)
Glucose, Bld: 111 mg/dL — ABNORMAL HIGH (ref 70–99)
Potassium: 4.7 mmol/L (ref 3.5–5.1)
Sodium: 138 mmol/L (ref 135–145)

## 2022-05-19 LAB — RENAL FUNCTION PANEL
Albumin: 3 g/dL — ABNORMAL LOW (ref 3.5–5.0)
Anion gap: 11 (ref 5–15)
BUN: 58 mg/dL — ABNORMAL HIGH (ref 8–23)
CO2: 12 mmol/L — ABNORMAL LOW (ref 22–32)
Calcium: 8.4 mg/dL — ABNORMAL LOW (ref 8.9–10.3)
Chloride: 112 mmol/L — ABNORMAL HIGH (ref 98–111)
Creatinine, Ser: 4.41 mg/dL — ABNORMAL HIGH (ref 0.44–1.00)
GFR, Estimated: 10 mL/min — ABNORMAL LOW (ref >60)
Glucose, Bld: 123 mg/dL — ABNORMAL HIGH (ref 70–99)
Phosphorus: 5.9 mg/dL — ABNORMAL HIGH (ref 2.5–4.6)
Potassium: 4.8 mmol/L (ref 3.5–5.1)
Sodium: 135 mmol/L (ref 135–145)

## 2022-05-19 MED ORDER — SODIUM CHLORIDE 0.9 % IV SOLN: INTRAVENOUS | Status: AC

## 2022-05-19 MED ORDER — SODIUM BICARBONATE 650 MG PO TABS
650.0000 mg | ORAL_TABLET | Freq: Two times a day (BID) | ORAL | Status: DC
  Administered 2022-05-19 – 2022-05-22 (×8): 650 mg via ORAL
  Filled 2022-05-19 (×10): qty 1

## 2022-05-19 NOTE — Plan of Care (Signed)
  Problem: Education: Goal: Knowledge of disease or condition will improve Outcome: Progressing   Problem: Coping: Goal: Will identify appropriate support needs Outcome: Progressing   Problem: Coping: Goal: Will verbalize positive feelings about self Outcome: Progressing   Problem: Nutrition: Goal: Risk of aspiration will decrease Outcome: Progressing   Problem: Self-Care: Goal: Verbalization of feelings and concerns over difficulty with self-care will improve Outcome: Progressing

## 2022-05-19 NOTE — Progress Notes (Signed)
OT Cancellation Note  Patient Details Name: Charlye Spare MRN: [id number] DOB: 01/15/1946   Cancelled Treatment:    Reason Eval/Treat Not Completed: Patient declined, no reason specified  Pt reporting she is fatigued and requesting therapist to please return tomorrow.  Elder Cyphers, OTR/L [facility] Acute Rehabilitation Office: [phone]    Magnus Ivan 05/19/2022, 5:06 PM

## 2022-05-19 NOTE — Consult Note (Signed)
[facility] Renal Consultation Note  Requesting MD: Leonie Man Indication for Consultation: A on CRF  HPI:  Lauren Craig is a 76 y.o. female with past medical history significant for HTN, T2 DM, PAD, hyperlipidemia, DJD and crebrovascular dz-  "multiple prior strokes" on plavix.  She presented to the hospital on 12/17 with c/o weakness and slurred speech-  SBP of 220-  code stroke enacted.  CT= trace SAH and MRI showed right MCA small scattered infarcts- CTA high grade stenoses found. Neurologically she has stabilized-  were planning for discharge but developed A on CRF.  Appears that baseline crt looks to be around 1.5- 1.8-  urine in 2022 = 100 of protein.  Upon admit crt was 2.1-  stayed stable until 12/20 when went up to 3.37 and is 4.27 today.  No U/A for review.  CT done with contrast on 12/17.  BP of late seems pretty reasonable with 259-563 systolic.  There was originally an acute drop of BP on 12/17.  UOP has not been really well recorded but was 700 over 12/19-12/20.  She says she is making more urine than normal-  she is getting up with the mobility team -  actually doing pretty well -  daughter [name] is here as well   Creatinine, Ser  Date/Time Value Ref Range Status  05/19/2022 04:43 AM 4.27 (H) 0.44 - 1.00 mg/dL Final  05/18/2022 04:05 AM 3.37 (H) 0.44 - 1.00 mg/dL Final    Comment:    DELTA CHECK NOTED  05/16/2022 06:24 AM 2.26 (H) 0.44 - 1.00 mg/dL Final  05/15/2022 12:48 PM 2.10 (H) 0.44 - 1.00 mg/dL Final  05/15/2022 12:13 PM 2.10 (H) 0.44 - 1.00 mg/dL Final  11/05/2021 10:12 AM 1.57 (H) 0.57 - 1.00 mg/dL Final  08/31/2021 08:34 PM 1.71 (H) 0.44 - 1.00 mg/dL Final  08/27/2021 12:16 PM 1.81 (H) 0.57 - 1.00 mg/dL Final  06/07/2021 11:40 AM 1.59 (H) 0.57 - 1.00 mg/dL Final  05/01/2021 11:40 AM 1.56 (H) 0.44 - 1.00 mg/dL Final  04/30/2021 11:39 AM 1.55 (H) 0.57 - 1.00 mg/dL Final  03/11/2021 11:40 AM 1.14 (H) 0.57 - 1.00 mg/dL Final  03/01/2021 12:36 PM 1.27 (H)  0.57 - 1.00 mg/dL Final  01/29/2021 10:40 AM 1.18 (H) 0.57 - 1.00 mg/dL Final  01/04/2021 06:52 AM 1.45 (H) 0.44 - 1.00 mg/dL Final  01/01/2021 12:14 AM 1.32 (H) 0.44 - 1.00 mg/dL Final  12/28/2020 05:35 AM 1.33 (H) 0.44 - 1.00 mg/dL Final  12/25/2020 03:56 PM 1.24 (H) 0.44 - 1.00 mg/dL Final  12/23/2020 04:21 AM 1.37 (H) 0.44 - 1.00 mg/dL Final  12/21/2020 01:11 AM 1.49 (H) 0.44 - 1.00 mg/dL Final  12/20/2020 01:04 AM 1.67 (H) 0.44 - 1.00 mg/dL Final  12/19/2020 12:37 AM 1.50 (H) 0.44 - 1.00 mg/dL Final  12/17/2020 07:05 PM 1.50 (H) 0.44 - 1.00 mg/dL Final  12/17/2020 06:35 PM 1.58 (H) 0.44 - 1.00 mg/dL Final  12/14/2020 10:32 AM 1.45 (H) 0.57 - 1.00 mg/dL Final  11/09/2020 10:08 AM 1.61 (H) 0.57 - 1.00 mg/dL Final  11/01/2020 01:07 AM 1.75 (H) 0.44 - 1.00 mg/dL Final  10/31/2020 01:17 AM 1.73 (H) 0.44 - 1.00 mg/dL Final  10/30/2020 04:36 AM 1.78 (H) 0.44 - 1.00 mg/dL Final  10/29/2020 12:38 AM 2.04 (H) 0.44 - 1.00 mg/dL Final  10/28/2020 11:06 AM 2.43 (H) 0.44 - 1.00 mg/dL Final  11/11/2019 12:00 PM 0.92 0.44 - 1.00 mg/dL Final  05/17/2018 12:18 PM 0.90 0.44 - 1.00 mg/dL Final  08/11/2017 07:57 AM 0.60 0.44 - 1.00 mg/dL Final     PMHx:   Past Medical History:  Diagnosis Date   Anxiety disorder    Arthritis    hands, hips   C. 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   Vitamin B12 deficiency     Past Surgical History:  Procedure Laterality Date   ABDOMINAL AORTOGRAM W/LOWER EXTREMITY N/A 08/11/2017   Procedure: ABDOMINAL AORTOGRAM W/LOWER EXTREMITY;  Surgeon: Angelia Mould, MD;  Location: [facility];  Service: Cardiovascular;  Laterality: N/A;   ABDOMINAL HYSTERECTOMY     AMPUTATION Left 11/14/2019   Procedure:  Left Fifth ray amputation;  Surgeon: Wylene Simmer, MD;  Location: [facility];  Service: Orthopedics;  Laterality: Left;   AMPUTATION TOE Left 05/17/2018   Procedure: 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;  Surgeon: Wylene Simmer, MD;  Location: [facility];  Service: Orthopedics;  Laterality: Left;   BIOPSY  10/29/2020   Procedure: BIOPSY;  Surgeon: Irene Shipper, MD;  Location: [facility] END[facility];  Service: Endoscopy;;   ESOPHAGOGASTRODUODENOSCOPY (EGD) WITH PROPOFOL N/A 10/29/2020   Procedure: ESOPHAGOGASTRODUODENOSCOPY (EGD) WITH PROPOFOL;  Surgeon: Irene Shipper, MD;  Location: [facility] ENDOSCOPY;  Service: Endoscopy;  Laterality: N/A;   PARTIAL HYMENECTOMY  1968   TONSILLECTOMY AND ADENOIDECTOMY     VAGINAL HYSTERECTOMY  1987    Family Hx:  Family History  Problem Relation Age of Onset   Diabetes Mellitus II Mother    Cerebrovascular Disease Mother    Hypertension Father    Heart Problems Brother        pacemaker   Diabetes Mellitus II Brother    Bipolar disorder Brother     Social History:  reports that she quit smoking about 19 years ago. Her smoking use included cigarettes. She smoked an average of 1 pack per day. She has never used smokeless tobacco. She reports that she does not currently use alcohol after a past usage of about 1.0 standard drink of alcohol per week. She reports that she does not use drugs.  Allergies:  Allergies  Allergen Reactions   Penicillins Hives, Rash and Other (See Comments)    Did it involve swelling of the face/tongue/throat, SOB, or low BP? Yes Did it involve sudden or severe rash/hives, skin peeling, or any reaction on the inside of your mouth or nose? No Did you need to seek medical attention at a hospital or doctor's office? Yes When did it last happen? ? If all above answers are "NO", may proceed with cephalosporin use.    Insulin Lispro Itching and Other (See Comments)    Site of injection was inflamed, red, painful episode   Insulin  Lispro Prot & Lispro Other (See Comments)    Site of injection was inflamed, red, painful episode Humalog Mix   Semaglutide Nausea And Vomiting   Metformin Diarrhea    Medications: Prior to Admission medications   Medication Sig Start Date End Date Taking? Authorizing Provider  acetaminophen (TYLENOL) 325 MG tablet Take 2 tablets (650 mg total) by mouth every 6 (six) hours as needed for headache. 11/01/20  Yes Alcus Dad, MD  albuterol (VENTOLIN HFA) 108 (90 Base) MCG/ACT inhaler Inhale  2 puffs into the lungs every 6 (six) hours as needed for wheezing or shortness of breath. 01/29/21  Yes Martyn Malay, MD  amLODipine (NORVASC) 10 MG tablet Take 1 tablet (10 mg total) by mouth daily. 07/05/21  Yes Martyn Malay, MD  carvedilol (COREG) 6.25 MG tablet Take 1 tablet by mouth 2 (two) times daily with a meal.   Yes [provider]  enalapril (VASOTEC) 20 MG tablet Take 1 tablet (20 mg total) by mouth at bedtime. 11/05/21  Yes Martyn Malay, MD  famotidine (PEPCID) 10 MG tablet Take 1 tablet (10 mg total) by mouth daily. 12/17/21  Yes Martyn Malay, MD  fluticasone (FLONASE) 50 MCG/ACT nasal spray Place 2 sprays into both nostrils daily.   Yes [provider]  nystatin cream (MYCOSTATIN) Apply 1 application topically 2 (two) times daily. 03/01/21  Yes Martyn Malay, MD  ondansetron (ZOFRAN-ODT) 4 MG disintegrating tablet Take 1 tablet (4 mg total) by mouth every 8 (eight) hours as needed for nausea or vomiting. 04/05/22  Yes Levin Erp, PA  OVER THE COUNTER MEDICATION Take 1 tablet by mouth daily. Prevagen - once daily   Yes [provider]  pantoprazole (PROTONIX) 40 MG tablet Take 1 tablet (40 mg total) by mouth 2 (two) times daily, 30-60 minutes before breakfast and dinner. 04/05/22  Yes Levin Erp, PA  promethazine (PHENERGAN) 25 MG tablet Take 1 tablet (25 mg total) by mouth every 6 (six) hours as needed for nausea or vomiting. As needed  06/07/21  Yes Martyn Malay, MD  sertraline (ZOLOFT) 25 MG tablet Take 1 tablet (25 mg total) by mouth daily. 03/03/21  Yes Martyn Malay, MD  sucralfate (CARAFATE) 1 g tablet Take 1 tablet (1 g total) by mouth daily before supper. 01/29/21  Yes Martyn Malay, MD  triamcinolone ointment (KENALOG) 0.1 % Apply 1 application topically 2 (two) times daily. 03/01/21  Yes Martyn Malay, MD  atorvastatin (LIPITOR) 80 MG tablet Take 1 tablet (80 mg total) by mouth at bedtime. Patient not taking: Reported on 05/16/2022 03/19/21   Richardo Priest, MD  ezetimibe (ZETIA) 10 MG tablet Take 1 tablet (10 mg total) by mouth daily. Patient not taking: Reported on 05/16/2022 03/19/21   Richardo Priest, MD  indapamide (LOZOL) 2.5 MG tablet Take 1 tablet (2.5 mg total) by mouth daily. Patient not taking: Reported on 05/16/2022 07/05/21   Martyn Malay, MD    I have reviewed the patient's current medications.  Labs:  Results for orders placed or performed during the hospital encounter of 05/15/22 (from the past 48 hour(s))  Basic metabolic panel     Status: Abnormal   Collection Time: 05/18/22  4:05 AM  Result Value Ref Range   Sodium 136 135 - 145 mmol/L   Potassium 4.6 3.5 - 5.1 mmol/L   Chloride 109 98 - 111 mmol/L   CO2 16 (L) 22 - 32 mmol/L   Glucose, Bld 120 (H) 70 - 99 mg/dL    Comment: Glucose reference range applies only to samples taken after fasting for at least 8 hours.   BUN 50 (H) 8 - 23 mg/dL   Creatinine, Ser 3.37 (H) 0.44 - 1.00 mg/dL    Comment: DELTA CHECK NOTED   Calcium 8.5 (L) 8.9 - 10.3 mg/dL   GFR, Estimated 14 (L) >60 mL/min    Comment: (NOTE) Calculated using the CKD-EPI Creatinine Equation (2021)    Anion gap 11  5 - 15    Comment: Performed at [facility], [place] [address] [place], [place] [address]  Magnesium     Status: None   Collection Time: 05/18/22  4:05 AM  Result Value Ref Range   Magnesium 1.9 1.7 - 2.4 mg/dL    Comment: Performed at [facility] [address], [place], [place] [address]  Basic metabolic panel     Status: Abnormal   Collection Time: 05/19/22  4:43 AM  Result Value Ref Range   Sodium 138 135 - 145 mmol/L   Potassium 4.7 3.5 - 5.1 mmol/L   Chloride 113 (H) 98 - 111 mmol/L   CO2 15 (L) 22 - 32 mmol/L   Glucose, Bld 111 (H) 70 - 99 mg/dL    Comment: Glucose reference range applies only to samples taken after fasting for at least 8 hours.   BUN 55 (H) 8 - 23 mg/dL   Creatinine, Ser 4.27 (H) 0.44 - 1.00 mg/dL   Calcium 8.3 (L) 8.9 - 10.3 mg/dL   GFR, Estimated 10 (L) >60 mL/min    Comment: (NOTE) Calculated using the CKD-EPI Creatinine Equation (2021)    Anion gap 10 5 - 15    Comment: Performed at [facility] [address], [place], [place] [address]     ROS:  A comprehensive review of systems was negative except for: Gastrointestinal: positive for nausea and dec appetite Neurological: positive for speech problems and weakness  Physical Exam: Vitals:   05/19/22 0802 05/19/22 1205  BP: (!) 168/54 (!) 130/51  Pulse: 68 (!) 53  Resp: 17 17  Temp: (!) 97.5 F (36.4 C) 97.6 F (36.4 C)  SpO2: 95% 99%     General: obvious slurred speech-  emotionally labile, pale-  getting up with mobility- stand by assist- NAD HEENT: PERRLA, EOMI, mucous membranes slightly dry-  coating on tongue Neck: no JVD Heart: RRR Lungs: mostly clear Abdomen: soft, non tender Extremities: no edema  Skin: positive for tenting Neuro: speech issues and global weakness  Assessment/Plan: 76 year old WF-  vasculopath-  presents with new CVA/SAH and has developed Aon CRF in house 1.Renal- baseline crt 1.5 to 1.8 with history of mild proteinuria-  likely due to HTN.  Now with A on CRF in the setting of CVA and SAH-  events noted acute drop in BP and contrasted CT on 12/17.  Crt still rising but non oliguric.  I suspect ATN plus contrast mediated AKI.  Will check urine but with good self reported UOP will not check imaging.   Hopefully should see plateau and improvement soon.  There are no absolute indications for RRT and she does not seem to be overly uremic.  We have time to observe.  Wishes were not assessed regarding dialysis-  just happy she does not need right now 2. Hypertension/volume  - now with reasonable control-  on amlodipine/coreg and hydralazine-  no changes made to meds.  She appears dry to me and is not taking in a lot of PO's-  I am going to give her a slow liter of IVF 3. Metabolic acidosis-  will start sodium bicarb  4. Anemia  - hgb over 10-  no action needed at this time  5.  Neuroo- s/p CVA and also SAH-  seems to be doing well-  planning for discharge when renal function settles out   [employer] 05/19/2022, 1:51 PM

## 2022-05-19 NOTE — Assessment & Plan Note (Signed)
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

## 2022-05-19 NOTE — Progress Notes (Signed)
Physical Therapy Treatment Patient Details Name: Lauren Craig MRN: [id number] DOB: 06-27-1945 Today's Date: 05/19/2022   History of Present Illness Pt is a 76 y/o female presenting to the ED 12/17 with slurred speech and weakness, left facial droop and decreased vision L upper visual field.  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  PMHx  anxiety d/o, HTN, osteomyelities L 5th toe, stroke, DM2    PT Comments    Pt with continued progress towards goals this session. Pt able to come to sitting EOB with min assist to elevate trunk and complete transfers sit<>stand with min assist to power up and steady on rise, pt needing cues at start for hand placement with good carryover. Pt able to step pivot to recliner with min assist and complete short bout of gait in room with RW and min assist down to min guard with distance limited to pt fatigue. Encouraged pt for another trial of ambulation however pt recliner back in chair and shaking head no. Current plan remains appropriate to address deficits and maximize functional independence and safety. Pt continues to benefit from skilled PT services to progress toward functional mobility goals.    Recommendations for follow up therapy are one component of a multi-disciplinary discharge planning process, led by the attending physician.  Recommendations may be updated based on patient status, additional functional criteria and insurance authorization.  Follow Up Recommendations  Home health PT     Assistance Recommended at Discharge Intermittent Supervision/Assistance  Patient can return home with the following A little help with walking and/or transfers;A little help with bathing/dressing/bathroom;Assistance with cooking/housework;Assist for transportation;Help with stairs or ramp for entrance   Equipment Recommendations  Other (comment) (TBD)    Recommendations  for Other Services       Precautions / Restrictions Precautions Precautions: Fall Restrictions Weight Bearing Restrictions: No     Mobility  Bed Mobility Overal bed mobility: Needs Assistance Bed Mobility: Supine to Sit     Supine to sit: Min assist     General bed mobility comments: cues for sequencing, no assist for LE's, truncal assist up and forward minimally.  No assist needed for scooting to EOB.    Transfers Overall transfer level: Needs assistance Equipment used: Rolling walker (2 wheels) Transfers: Sit to/from Stand, Bed to chair/wheelchair/BSC Sit to Stand: Min assist   Step pivot transfers: Min assist       General transfer comment: min assist to steady    Ambulation/Gait Ambulation/Gait assistance: Min assist, Min guard Gait Distance (Feet): 4 Feet Assistive device: Rolling walker (2 wheels) Gait Pattern/deviations: Step-through pattern, Shuffle Gait velocity: decr     General Gait Details: small shuffling steps in room away from recliner, distance limited to fatigue, pt declining second attempt   Stairs             Wheelchair Mobility    Modified Rankin (Stroke Patients Only) Modified Rankin (Stroke Patients Only) Pre-Morbid Rankin Score: No symptoms Modified Rankin: Moderately severe disability     Balance Overall balance assessment: Needs assistance Sitting-balance support: Single extremity supported, Feet supported Sitting balance-Leahy Scale: Fair     Standing balance support: Bilateral upper extremity supported, During functional activity Standing balance-Leahy Scale: Poor Standing balance comment: reliant on external support                            Cognition Arousal/Alertness:  Awake/alert Behavior During Therapy: WFL for tasks assessed/performed Overall Cognitive Status: Within Functional Limits for tasks assessed                                          Exercises      General  Comments General comments (skin integrity, edema, etc.): VSS on RA      Pertinent Vitals/Pain Pain Assessment Pain Assessment: Faces Faces Pain Scale: Hurts a little bit Pain Location: LEs with movement Pain Descriptors / Indicators: Grimacing, Sore Pain Intervention(s): Monitored during session, Limited activity within patient's tolerance, Repositioned    Home Living                          Prior Function            PT Goals (current goals can now be found in the care plan section) Acute Rehab PT Goals Patient Stated Goal: to get stronger PT Goal Formulation: With patient Time For Goal Achievement: 05/30/22 Progress towards PT goals: Progressing toward goals    Frequency    Min 3X/week      PT Plan      Co-evaluation              AM-PAC PT "6 Clicks" Mobility   Outcome Measure  Help needed turning from your back to your side while in a flat bed without using bedrails?: A Little Help needed moving from lying on your back to sitting on the side of a flat bed without using bedrails?: A Little Help needed moving to and from a bed to a chair (including a wheelchair)?: A Little Help needed standing up from a chair using your arms (e.g., wheelchair or bedside chair)?: A Little Help needed to walk in hospital room?: A Little Help needed climbing 3-5 steps with a railing? : Total 6 Click Score: 16    End of Session Equipment Utilized During Treatment: Gait belt Activity Tolerance: Patient tolerated treatment well;Patient limited by fatigue Patient left: in chair;with call bell/phone within reach;with chair alarm set Nurse Communication: Mobility status PT Visit Diagnosis: Other abnormalities of gait and mobility (R26.89);Other symptoms and signs involving the nervous system (R29.898);Difficulty in walking, not elsewhere classified (R26.2)     Time: 6712-4580 PT Time Calculation (min) (ACUTE ONLY): 23 min  Charges:  $Gait Training: 8-22  mins $Therapeutic Activity: 8-22 mins                    Darthy Manganelli R. PTA Acute Rehabilitation Services Office: [place] 05/19/2022, 10:44 AM

## 2022-05-19 NOTE — Assessment & Plan Note (Addendum)
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

## 2022-05-19 NOTE — Progress Notes (Addendum)
STROKE TEAM PROGRESS NOTE   INTERVAL HISTORY Patient was seen in her room with no family at the bedside.  Neurological and hemodynamically stable.  MRI scan of the brain yesterday showed stable trace subarachnoid hemorrhage over the right vertex with evidence of scattered acute right MCA infarcts. Cr is elevated to 4.27.  Plan medical hospitalist team consult to help with medical management Vital signs stable.  Neurological exam unchanged. Vitals:   05/18/22 2339 05/19/22 0045 05/19/22 0413 05/19/22 0802  BP: (!) 165/55 (!) 159/48 (!) 139/50 (!) 168/54  Pulse: 67  61 68  Resp:    17  Temp: 98.1 F (36.7 C)  98.4 F (36.9 C) (!) 97.5 F (36.4 C)  TempSrc: Oral  Oral Oral  SpO2: 92% 94% 96% 95%  Weight:      Height:       CBC:  Recent Labs  Lab 05/15/22 1213 05/15/22 1248 05/16/22 0624  WBC 10.2  --  6.8  NEUTROABS 8.8*  --   --   HGB 11.2* 11.9* 10.1*  HCT 34.5* 35.0* 30.8*  MCV 81.8  --  81.9  PLT 247  --  656    Basic Metabolic Panel:  Recent Labs  Lab 05/16/22 0624 05/18/22 0405 05/19/22 0443  NA 137 136 138  K 5.0 4.6 4.7  CL 112* 109 113*  CO2 16* 16* 15*  GLUCOSE 103* 120* 111*  BUN 43* 50* 55*  CREATININE 2.26* 3.37* 4.27*  CALCIUM 8.4* 8.5* 8.3*  MG 1.8 1.9  --     Lipid Panel:  Recent Labs  Lab 05/15/22 1256  CHOL 324*  TRIG 102  HDL 50  CHOLHDL 6.5  VLDL 20  LDLCALC 254*    HgbA1c:  Recent Labs  Lab 05/15/22 1256  HGBA1C 6.4*    Urine Drug Screen: No results for input(s): "LABOPIA", "COCAINSCRNUR", "LABBENZ", "AMPHETMU", "THCU", "LABBARB" in the last 168 hours.  Alcohol Level  Recent Labs  Lab 05/15/22 1215  ETH <10     IMAGING past 24 hours No results found.  PHYSICAL EXAM General: Alert, well-developed well-nourished elderly patient in no acute distress Respiratory: Regular, unlabored respirations on room air  NEURO:  Mental Status: AA&Ox3  Speech/Language: speech is with some dysarthria  Cranial Nerves:  II:  PERRL.  III, IV, VI: EOMI. Eyelids elevate symmetrically.  V: Sensation is intact to light touch and symmetrical to face.  VII: Left facial droop present VIII: hearing intact to voice. IX, X: Voice is dysarthric XII: tongue is midline without fasciculations. Motor: 5/5 strength to bilateral upper extremities and right lower extremity, 4+ out of 5 strength to left lower extremity Tone: is normal and bulk is normal Sensation- Intact to light touch bilaterally.  Gait- deferred   ASSESSMENT/PLAN [name] is a 76 y.o. female with history of multiple prior strokes, hypertension, arthritis, hyperlipidemia, osteomyelitis, PVD, diabetes, esophagitis and anxiety presenting after being found down at home with dysarthria and left-sided facial droop. The day before yesterday, family noted that she seemed unsteady and had slurred speech.  Yesterday, she was found on the floor in her house and was brought to the emergency department.  She was noted to have a left facial droop and left upper visual field cut.  Blood pressure was elevated with systolics in the 812X, and CT head revealed trace subarachnoid hemorrhage at the right vertex.  MRI demonstrated DWI artifact at right vertex and area of subarachnoid hemorrhage with scattered small acute right MCA territory infarcts.  Stroke:  right MCA small scattered infarcts with trace right frontal subarachnoid hemorrhage Etiology: Likely embolic Code Stroke CT head trace subarachnoid hemorrhage at right vertex CTA head & neck no LVO, high-grade stenosis at origin of right vertebral artery, high-grade stenosis of distal right V4, bilateral P2 moderate stenosis, mild narrowing of distal M1 segments bilaterally MRI stable trace subarachnoid hemorrhage at right vertex with mild DWI artifact there, also scattered small acute right MCA territory infarcts 2D Echo EF 55 to 26%, grade 2 diastolic dysfunction, mildly dilated left atrium, normal atrial septum LDL  254 HgbA1c 6.4 Will consider loop recorder versus 30-day cardiac monitor to rule out A-fib at discharge VTE prophylaxis -SCDs    Diet   DIET DYS 3 Room service appropriate? Yes with Assist; Fluid consistency: Thin   clopidogrel 75 mg daily prior to admission, now on no antithrombotic do to [facility]  Therapy recommendations: Home health  Disposition: Pending  Hypertension Home meds: Indapamide 2.5 mg daily, amlodipine 10 mg once daily, carvedilol 6.25 mg daily, enalapril 20 mg at bedtime Start amlodipine 10 mg daily and carvedilol 6.25 mg daily as well as enalapril 20 mg at bedtime Stable off Cleviprex Keep systolic blood pressure less than 160 Long-term BP goal normotensive  Hyperlipidemia Home meds: Ezetimibe 10 mg daily, resumed in hospital LDL 254, goal < 70 Add rosuvastatin 40 mg daily Consider referral to the lipid clinic if high intensity statin does not lower LDL to goal Continue statin at discharge  Diabetes type II Controlled Home meds: None HgbA1c 6.4, goal < 7.0 CBGs Recent Labs    05/16/22 2146 05/17/22 0753 05/17/22 1205  GLUCAP 133* 128* 125*     SSI  Other Stroke Risk Factors Advanced Age >/= 25  Former cigarette smoker  Other Active Problems Diarrhea  Imodium as needed  Placed patient on enteric contact precautions and test for C. difficile as she was on Dificid at home AKI on CKD  Cr. 2.26-> 3.37 -> 4.27 Hospitalist team consulted for assistance   Hospital day # 4   Patient seen and examined by NP/APP with MD. MD to update note as needed.   Janine Ores, DNP, FNP-BC [facility] Pager: [phone]  I have personally obtained history,examined this patient, reviewed notes, independently viewed imaging studies, participated in medical decision making and plan of care.ROS completed by me personally and pertinent positives fully documented  I have made any additions or clarifications directly to the above note. Agree with note above.   Patient remains neurologically stable but has worsening renal function.  Will get medical hospitalist team consult for medical management help.  Continue strict control of blood pressure systolic goal below 741.  Repeat CT scan of the head tomorrow.  Mobilize out of bed.  Therapy consults.  Appreciate nephrology consult. No family at the bedside for discussion.This patient is critically ill and at significant risk of neurological worsening, death and care requires constant monitoring of vital signs, hemodynamics,respiratory and cardiac monitoring, extensive review of multiple databases, frequent neurological assessment, discussion with family, other specialists and medical decision making of high complexity.I have made any additions or clarifications directly to the above note.This critical care time does not reflect procedure time, or teaching time or supervisory time of PA/NP/Med Resident etc but could involve care discussion time.  I spent 30 minutes of neurocritical care time  in the care of  this patient.     Antony Contras, MD Medical Director [facility] Pager: [phone] 05/19/2022 5:13 PM  To contact Stroke Continuity provider, please refer to [email or web address] After hours, contact General Neurology

## 2022-05-19 NOTE — Care Management Important Message (Signed)
Important Message  Patient Details  Name: Lauren Craig MRN: [id number] Date of Birth: [birth year]   Medicare Important Message Given:  Yes     Thong Feeny Montine Circle 05/19/2022, 3:40 PM

## 2022-05-19 NOTE — Assessment & Plan Note (Signed)
-  Patient with prior h/o CVA who presented with stroke-like symptoms on 12/17 and was found to have [place] -She has stabilized from a neurologic standpoint but is having worsening renal failure (see below) -Ongoing speech issues as well as some dysphagia, needs continuation of speech therapy at home and dysphagia 3 diet -Declined PT/OT today because of fatigue -Will assume care from neurology but have asked that they continue to follow for at least one more day

## 2022-05-19 NOTE — Assessment & Plan Note (Signed)
-  Continue amlodipine, carvedilol, hydralazine -Needs ongoing good BP control for both kidneys and neurology reasons -Will add prn IV hydralazine

## 2022-05-19 NOTE — Assessment & Plan Note (Signed)
-  Recent A1c was 6.4 -No meds -Will cover with moderate-scale SSI

## 2022-05-19 NOTE — Plan of Care (Signed)
  Problem: Education: Goal: Knowledge of disease or condition will improve Outcome: Progressing   Problem: Intracerebral Hemorrhage Tissue Perfusion: Goal: Complications of Intracerebral Hemorrhage will be minimized Outcome: Progressing   Problem: Coping: Goal: Will verbalize positive feelings about self Outcome: Progressing Goal: Will identify appropriate support needs Outcome: Progressing   Problem: Health Behavior/Discharge Planning: Goal: Ability to manage health-related needs will improve Outcome: Progressing   Problem: Self-Care: Goal: Ability to participate in self-care as condition permits will improve Outcome: Progressing Goal: Verbalization of feelings and concerns over difficulty with self-care will improve Outcome: Progressing Goal: Ability to communicate needs accurately will improve Outcome: Progressing   Problem: Nutrition: Goal: Risk of aspiration will decrease Outcome: Progressing Goal: Dietary intake will improve Outcome: Progressing   Problem: Education: Goal: Ability to describe self-care measures that may prevent or decrease complications (Diabetes Survival Skills Education) will improve Outcome: Progressing Goal: Individualized Educational Video(s) Outcome: Progressing   Problem: Coping: Goal: Ability to adjust to condition or change in health will improve Outcome: Progressing   Problem: Fluid Volume: Goal: Ability to maintain a balanced intake and output will improve Outcome: Progressing   Problem: Health Behavior/Discharge Planning: Goal: Ability to identify and utilize available resources and services will improve Outcome: Progressing Goal: Ability to manage health-related needs will improve Outcome: Progressing   Problem: Metabolic: Goal: Ability to maintain appropriate glucose levels will improve Outcome: Progressing   Problem: Nutritional: Goal: Maintenance of adequate nutrition will improve Outcome: Progressing Goal: Progress  toward achieving an optimal weight will improve Outcome: Progressing   Problem: Skin Integrity: Goal: Risk for impaired skin integrity will decrease Outcome: Progressing   Problem: Tissue Perfusion: Goal: Adequacy of tissue perfusion will improve Outcome: Progressing   Problem: Education: Goal: Knowledge of General Education information will improve Description: Including pain rating scale, medication(s)/side effects and non-pharmacologic comfort measures Outcome: Progressing   Problem: Health Behavior/Discharge Planning: Goal: Ability to manage health-related needs will improve Outcome: Progressing   Problem: Clinical Measurements: Goal: Ability to maintain clinical measurements within normal limits will improve Outcome: Progressing Goal: Will remain free from infection Outcome: Progressing Goal: Diagnostic test results will improve Outcome: Progressing Goal: Cardiovascular complication will be avoided Outcome: Progressing   Problem: Activity: Goal: Risk for activity intolerance will decrease Outcome: Progressing   Problem: Nutrition: Goal: Adequate nutrition will be maintained Outcome: Progressing   Problem: Elimination: Goal: Will not experience complications related to bowel motility Outcome: Progressing Goal: Will not experience complications related to urinary retention Outcome: Progressing   Problem: Pain Managment: Goal: General experience of comfort will improve Outcome: Progressing   Problem: Safety: Goal: Ability to remain free from injury will improve Outcome: Progressing   Problem: Skin Integrity: Goal: Risk for impaired skin integrity will decrease Outcome: Progressing   Problem: Skin Integrity: Goal: Risk for impaired skin integrity will decrease Outcome: Progressing   Problem: Education: Goal: Knowledge of secondary prevention will improve (MUST DOCUMENT ALL) Outcome: Completed/Met Goal: Knowledge of patient specific risk factors will  improve [place] N/A or DELETE if not current risk factor) Outcome: Completed/Met   Problem: Health Behavior/Discharge Planning: Goal: Goals will be collaboratively established with patient/family Outcome: Completed/Met   Problem: Coping: Goal: Level of anxiety will decrease Outcome: Completed/Met   Problem: Clinical Measurements: Goal: Respiratory complications will improve Outcome: Not Applicable

## 2022-05-19 NOTE — Progress Notes (Signed)
Mobility Specialist: Progress Note   05/19/22 1412  Mobility  Activity Transferred from bed to chair  Level of Assistance Minimal assist, patient does 75% or more  Assistive Device Other (Comment) (HHA)  Distance Ambulated (ft) 2 ft  Activity Response Tolerated well  Mobility Referral Yes  $Mobility charge 1 Mobility   Pt received in the bed with RN present in the room. MinA with bed mobility as well as to stand. Assisted pt to the chair per request. Pt declining ambulation at this time secondary to fatigue and requesting RN to be cleaned up. Pt is in the chair with call bell at her side. Family present in the room.   [place] Azyiah Bo Mobility Specialist Please contact via SecureChat or Rehab office at [phone]

## 2022-05-19 NOTE — Assessment & Plan Note (Signed)
-   Continue Crestor, Zetia 

## 2022-05-19 NOTE — Consult Note (Signed)
Initial Consultation Note   Patient: Lauren Craig HER:[id number] DOB: 01/09/1946 PCP: Martyn Malay, MD DOA: 05/15/2022 DOS: the patient was seen and examined on 05/19/2022 Primary service: Stroke, Md, MD  Referring physician: Sethi/Shafer Reason for consult: [name] is a 76 y.o. female with history of multiple prior strokes, hypertension, arthritis, hyperlipidemia, osteomyelitis, PVD, diabetes, esophagitis and anxiety presenting after being found down at home with dysarthria and left-sided facial droop. She has a hemorrhage that is likely a transformation from an embolic stroke. She was supposed to be discharged yesterday but her creatinine is getting worse. Cr was around 2.1 when she came in and is over 4 today.    Assessment and Plan: * ICH (intracerebral hemorrhage) ([place]) -Patient with prior h/o CVA who presented with stroke-like symptoms on 12/17 and was found to have [place] -She has stabilized from a neurologic standpoint but is having worsening renal failure (see below) -Ongoing speech issues as well as some dysphagia, needs continuation of speech therapy at home and dysphagia 3 diet -Declined PT/OT today because of fatigue -Will assume care from neurology but have asked that they continue to follow for at least one more day  Acute renal failure superimposed on stage 3b chronic kidney disease ([place]) -Baseline marginal 3b (approaching stage 4) CKD -Since admission, she has had progressive renal failure -She did receive contrast with CTA on presentation but none since -No obvious medications as the cause -She did have significant BP lowering which have led to ATN -Will request nephrology assistance -Patient would decline HD if this meant her demise -Avoid nephrotoxic agents -Recheck BMP in AM  Controlled diabetes mellitus type 2 with complications ([place]) -Recent A1c was 6.4 -No meds -Will cover with moderate-scale SSI  Hyperlipidemia -Continue Crestor, Zetia  HTN  (hypertension) Difficult to Control with Isolated Systolic HTN  -Continue amlodipine, carvedilol, hydralazine -Needs ongoing good BP control for both kidneys and neurology reasons -Will add prn IV hydralazine  PVD (peripheral vascular disease) ([place]) -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  Thank you for this interesting consult.     HPI: Lauren Craig is a 77 y.o. female with past medical history of HTN, HLD, PVD s/p 5th toe amputation, recurrent CVA, and DM who presented on 12/17 as a code stroke.  She was found to have a small SAH with evidence of scattered acute R MCA infarcts.  She has improved from a neurologic standpoint but her creatinine has been worsening.  The patient continues to have significant speech disturbance but is understandable with patience.  She is also having some dysphagia.  She is aware of prior CKD issues and would not proceed with HD if recommended.  She is DNR.  She feels ok but is still trying to work with therapy.  She reports that she is doing too well to qualify for CIR and is planning to be discharged home.  Review of Systems: As mentioned in the history of present illness. All other systems reviewed and are negative. Past Medical History:  Diagnosis Date   Anxiety disorder    Arthritis    hands, hips   C. difficile colitis    Esophagitis    Essential (primary) hypertension 1990   Mixed hyperlipidemia 2010   Osteomyelitis ([place])    left 5th toe   PVD (peripheral vascular disease) (HCC)    Seasonal allergies    Stroke (HCC)    Type 2 diabetes mellitus with diabetic cataract (  [place]) 2002   Vitamin B12 deficiency    Past Surgical History:  Procedure Laterality Date   ABDOMINAL AORTOGRAM W/LOWER EXTREMITY N/A 08/11/2017   Procedure: ABDOMINAL AORTOGRAM W/LOWER EXTREMITY;  Surgeon: Angelia Mould, MD;  Location: [place] [facility];  Service: Cardiovascular;  Laterality: N/A;   ABDOMINAL HYSTERECTOMY     AMPUTATION  Left 11/14/2019   Procedure: Left Fifth ray amputation;  Surgeon: Wylene Simmer, MD;  Location: [place];  Service: Orthopedics;  Laterality: Left;   AMPUTATION TOE Left 05/17/2018   Procedure: 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;  Surgeon: Wylene Simmer, MD;  Location: [place];  Service: Orthopedics;  Laterality: Left;   BIOPSY  10/29/2020   Procedure: BIOPSY;  Surgeon: Irene Shipper, MD;  Location: [facility];  Service: Endoscopy;;   ESOPHAGOGASTRODUODENOSCOPY (EGD) WITH PROPOFOL N/A 10/29/2020   Procedure: ESOPHAGOGASTRODUODENOSCOPY (EGD) WITH PROPOFOL;  Surgeon: Irene Shipper, MD;  Location: [facility] ENDOSCOPY;  Service: Endoscopy;  Laterality: N/A;   PARTIAL HYMENECTOMY  1968   TONSILLECTOMY AND ADENOIDECTOMY     VAGINAL HYSTERECTOMY  1987   Social History:  reports that she quit smoking about 19 years ago. Her smoking use included cigarettes. She smoked an average of 1 pack per day. She has never used smokeless tobacco. She reports that she does not currently use alcohol after a past usage of about 1.0 standard drink of alcohol per week. She reports that she does not use drugs.  Allergies  Allergen Reactions   Penicillins Hives, Rash and Other (See Comments)    Did it involve swelling of the face/tongue/throat, SOB, or low BP? Yes Did it involve sudden or severe rash/hives, skin peeling, or any reaction on the inside of your mouth or nose? No Did you need to seek medical attention at a hospital or doctor's office? Yes When did it last happen? ? If all above answers are "NO", may proceed with cephalosporin use.    Insulin Lispro Itching and Other (See Comments)    Site of injection was inflamed, red, painful episode   Insulin Lispro Prot & Lispro Other (See Comments)    Site of injection was inflamed, red, painful episode Humalog Mix   Semaglutide Nausea And Vomiting   Metformin Diarrhea    Family History  Problem Relation Age of Onset   Diabetes Mellitus II Mother     Cerebrovascular Disease Mother    Hypertension Father    Heart Problems Brother        pacemaker   Diabetes Mellitus II Brother    Bipolar disorder Brother     Prior to Admission medications   Medication Sig Start Date End Date Taking? Authorizing Provider  acetaminophen (TYLENOL) 325 MG tablet Take 2 tablets (650 mg total) by mouth every 6 (six) hours as needed for headache. 11/01/20  Yes Alcus Dad, MD  albuterol (VENTOLIN HFA) 108 (90 Base) MCG/ACT inhaler Inhale 2 puffs into the lungs every 6 (six) hours as needed for wheezing or shortness of breath. 01/29/21  Yes Martyn Malay, MD  amLODipine (NORVASC) 10 MG tablet Take 1 tablet (10 mg total) by mouth daily. 07/05/21  Yes Martyn Malay, MD  carvedilol (COREG) 6.25 MG tablet Take 1 tablet by mouth 2 (two) times daily with a meal.   Yes [provider]  enalapril (VASOTEC) 20 MG tablet Take 1 tablet (20 mg total) by mouth at bedtime. 11/05/21  Yes Martyn Malay, MD  famotidine (PEPCID) 10 MG tablet Take 1  tablet (10 mg total) by mouth daily. 12/17/21  Yes Martyn Malay, MD  fluticasone (FLONASE) 50 MCG/ACT nasal spray Place 2 sprays into both nostrils daily.   Yes [provider]  nystatin cream (MYCOSTATIN) Apply 1 application topically 2 (two) times daily. 03/01/21  Yes Martyn Malay, MD  ondansetron (ZOFRAN-ODT) 4 MG disintegrating tablet Take 1 tablet (4 mg total) by mouth every 8 (eight) hours as needed for nausea or vomiting. 04/05/22  Yes Levin Erp, PA  OVER THE COUNTER MEDICATION Take 1 tablet by mouth daily. Prevagen - once daily   Yes [provider]  pantoprazole (PROTONIX) 40 MG tablet Take 1 tablet (40 mg total) by mouth 2 (two) times daily, 30-60 minutes before breakfast and dinner. 04/05/22  Yes Levin Erp, PA  promethazine (PHENERGAN) 25 MG tablet Take 1 tablet (25 mg total) by mouth every 6 (six) hours as needed for nausea or vomiting. As needed 06/07/21  Yes Martyn Malay, MD  sertraline (ZOLOFT) 25 MG tablet Take 1 tablet (25 mg total) by mouth daily. 03/03/21  Yes Martyn Malay, MD  sucralfate (CARAFATE) 1 g tablet Take 1 tablet (1 g total) by mouth daily before supper. 01/29/21  Yes Martyn Malay, MD  triamcinolone ointment (KENALOG) 0.1 % Apply 1 application topically 2 (two) times daily. 03/01/21  Yes Martyn Malay, MD  atorvastatin (LIPITOR) 80 MG tablet Take 1 tablet (80 mg total) by mouth at bedtime. Patient not taking: Reported on 05/16/2022 03/19/21   Richardo Priest, MD  ezetimibe (ZETIA) 10 MG tablet Take 1 tablet (10 mg total) by mouth daily. Patient not taking: Reported on 05/16/2022 03/19/21   Richardo Priest, MD  indapamide (LOZOL) 2.5 MG tablet Take 1 tablet (2.5 mg total) by mouth daily. Patient not taking: Reported on 05/16/2022 07/05/21   Martyn Malay, MD    Physical Exam: Vitals:   05/19/22 0413 05/19/22 0802 05/19/22 1205 05/19/22 1522  BP: (!) 139/50 (!) 168/54 (!) 130/51 (!) 146/56  Pulse: 61 68 (!) 53   Resp:  17 17   Temp: 98.4 F (36.9 C) (!) 97.5 F (36.4 C) 97.6 F (36.4 C)   TempSrc: Oral Oral Oral   SpO2: 96% 95% 99%   Weight:      Height:       General:  Appears calm and comfortable and is in NAD Eyes:   EOMI, normal lids, iris ENT:  grossly normal hearing, lips & tongue, mmm Neck:  no LAD, masses or thyromegaly Cardiovascular:  RRR, no m/r/g. No LE edema.  Respiratory:   CTA bilaterally with no wheezes/rales/rhonchi.  Normal respiratory effort. Abdomen:  soft, NT, ND Skin:  no rash or induration seen on limited exam Musculoskeletal:  grossly normal tone BUE/BLE, good ROM, no bony abnormality Psychiatric:  blunted mood and affect, speech dysarthric and slow but appropriate, AOx3 Neurologic:  CN 2-12 grossly intact, moves all extremities in coordinated fashion   Radiological Exams on Admission: Independently reviewed - see discussion in A/P where applicable  No results found.  EKG: not done since  12/17   Labs on Admission: I have personally reviewed the available labs and imaging studies at the time of the admission.  Pertinent labs:    CO2 15 Glucose 111 BUN 55/Creatinine 4.27/GFR 10; 50/3.37/14 on 12/20; 42/2.10/24 on 12/17; baseline prior to 10/2021 appeared to be roughly 1.7/30    Family Communication: None present Primary team communication: I spoke with the neurology  team at the time of the consult.  Thank you very much for involving [place] in the care of your patient.  Author: Karmen Bongo, MD 05/19/2022 6:58 PM  For on call review [email or web address].

## 2022-05-20 DIAGNOSIS — N1832 Chronic kidney disease, stage 3b: Secondary | ICD-10-CM

## 2022-05-20 DIAGNOSIS — I619 Nontraumatic intracerebral hemorrhage, unspecified: Secondary | ICD-10-CM | POA: Diagnosis not present

## 2022-05-20 DIAGNOSIS — N170 Acute kidney failure with tubular necrosis: Secondary | ICD-10-CM | POA: Diagnosis not present

## 2022-05-20 DIAGNOSIS — I739 Peripheral vascular disease, unspecified: Secondary | ICD-10-CM | POA: Diagnosis not present

## 2022-05-20 DIAGNOSIS — I611 Nontraumatic intracerebral hemorrhage in hemisphere, cortical: Secondary | ICD-10-CM | POA: Diagnosis not present

## 2022-05-20 DIAGNOSIS — I10 Essential (primary) hypertension: Secondary | ICD-10-CM

## 2022-05-20 DIAGNOSIS — R2990 Unspecified symptoms and signs involving the nervous system: Secondary | ICD-10-CM | POA: Diagnosis not present

## 2022-05-20 LAB — RENAL FUNCTION PANEL
Albumin: 2.5 g/dL — ABNORMAL LOW (ref 3.5–5.0)
Anion gap: 8 (ref 5–15)
BUN: 60 mg/dL — ABNORMAL HIGH (ref 8–23)
CO2: 16 mmol/L — ABNORMAL LOW (ref 22–32)
Calcium: 8 mg/dL — ABNORMAL LOW (ref 8.9–10.3)
Chloride: 114 mmol/L — ABNORMAL HIGH (ref 98–111)
Creatinine, Ser: 4.41 mg/dL — ABNORMAL HIGH (ref 0.44–1.00)
GFR, Estimated: 10 mL/min — ABNORMAL LOW (ref >60)
Glucose, Bld: 93 mg/dL (ref 70–99)
Phosphorus: 5.6 mg/dL — ABNORMAL HIGH (ref 2.5–4.6)
Potassium: 4.5 mmol/L (ref 3.5–5.1)
Sodium: 138 mmol/L (ref 135–145)

## 2022-05-20 LAB — CBC
HCT: 28.4 % — ABNORMAL LOW (ref 36.0–46.0)
Hemoglobin: 9.4 g/dL — ABNORMAL LOW (ref 12.0–15.0)
MCH: 26.9 pg (ref 26.0–34.0)
MCHC: 33.1 g/dL (ref 30.0–36.0)
MCV: 81.1 fL (ref 80.0–100.0)
Platelets: 223 10*3/uL (ref 150–400)
RBC: 3.5 MIL/uL — ABNORMAL LOW (ref 3.87–5.11)
RDW: 15.9 % — ABNORMAL HIGH (ref 11.5–15.5)
WBC: 7.5 10*3/uL (ref 4.0–10.5)
nRBC: 0 % (ref 0.0–0.2)

## 2022-05-20 LAB — GLUCOSE, CAPILLARY: Glucose-Capillary: 117 mg/dL — ABNORMAL HIGH (ref 70–99)

## 2022-05-20 MED ORDER — SODIUM CHLORIDE 0.9 % IV SOLN: INTRAVENOUS | Status: DC

## 2022-05-20 MED ORDER — HYDRALAZINE HCL 50 MG PO TABS
50.0000 mg | ORAL_TABLET | Freq: Three times a day (TID) | ORAL | Status: DC
  Administered 2022-05-20 – 2022-05-22 (×5): 50 mg via ORAL
  Filled 2022-05-20 (×6): qty 1

## 2022-05-20 NOTE — Progress Notes (Signed)
Speech Language Pathology Treatment: Dysphagia;Cognitive-Linquistic  Patient Details Name: Lauren Craig MRN: [id number] DOB: December 15, 1945 Today's Date: 05/20/2022 Time: 2595-6387 SLP Time Calculation (min) (ACUTE ONLY): 28 min  Assessment / Plan / Recommendation Clinical Impression  Pt consumed peaches and ginger ale with Min verbal cues needed to take smaller sips after one larger sip resulted in immediate coughing. Pt is aware of when her sips are too large or too fast, but she seems to struggle sometimes with controlling how much she gets at a time. No further coughing was noted though, and she took extra time with mastication to make sure she was chewing the peaches thoroughly before swallowing. Will leave on current diet and precautions.   SLP also provided education about speech intelligibility strategies, and then provided opportunities for pt to utilize them at the sentence and conversational level. It helped improve her intelligibility overall, although did provide additional cueing for more pausing in between words to try to facilitate clarity further. All questions answered at this time. Continue to recommend [facility] SLP.   HPI HPI: Pt is a 76 yo female presenting with slurred speech and weakness after unwitnessed fall. CT shows a trace SAH at the R vertex. MRI shows stable SAH at the R vertex with mild DWI infarct there as well as strong evidence of superimposed scattered small acute R MCA territory infarcts, including corona radiata and anterior insula. 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  Recommendations may be updated based on patient status, additional functional criteria and insurance authorization.    Recommendations  Diet recommendations: Dysphagia 3 (mechanical soft);Thin liquid Liquids provided via: Cup;Straw Medication Administration: Whole meds with puree Supervision: Patient able to self feed;Intermittent supervision to cue for compensatory strategies Compensations: Slow rate;Small sips/bites Postural Changes and/or Swallow Maneuvers: Seated upright 90 degrees                Oral Care Recommendations: Oral care BID Follow Up Recommendations: Home health SLP Assistance recommended at discharge: Intermittent Supervision/Assistance SLP Visit Diagnosis: Dysphagia, oropharyngeal phase (R13.12) Plan: Continue with current plan of care           Osie Bond., M.A. [place] Office [phone]  Secure chat preferred   05/20/2022, 3:19 PM

## 2022-05-20 NOTE — Progress Notes (Signed)
STROKE TEAM PROGRESS NOTE   INTERVAL HISTORY Patient was seen in her room with no family at the bedside.  Neurological and hemodynamically stable.   Cr is stable to 4.1.  Plan medical hospitalist team consult to help with medical management Vital signs stable.  Neurological exam unchanged. Vitals:   05/20/22 0737 05/20/22 1054 05/20/22 1218 05/20/22 1513  BP: (!) 160/51 (!) 134/45 (!) 142/47 (!) 137/51  Pulse: 69 (!) 58 60 (!) 55  Resp: 18 17 17 17   Temp: 98.1 F (36.7 C) 97.9 F (36.6 C) 98.1 F (36.7 C) (!) 97.3 F (36.3 C)  TempSrc:  Oral Oral Oral  SpO2: 95% 96% 96% 93%  Weight:      Height:       CBC:  Recent Labs  Lab 05/15/22 1213 05/15/22 1248 05/16/22 0624 05/20/22 0306  WBC 10.2  --  6.8 7.5  NEUTROABS 8.8*  --   --   --   HGB 11.2*   < > 10.1* 9.4*  HCT 34.5*   < > 30.8* 28.4*  MCV 81.8  --  81.9 81.1  PLT 247  --  213 223   < > = values in this interval not displayed.   Basic Metabolic Panel:  Recent Labs  Lab 05/16/22 0624 05/18/22 0405 05/19/22 0443 05/19/22 1619 05/20/22 0303  NA 137 136   < > 135 138  K 5.0 4.6   < > 4.8 4.5  CL 112* 109   < > 112* 114*  CO2 16* 16*   < > 12* 16*  GLUCOSE 103* 120*   < > 123* 93  BUN 43* 50*   < > 58* 60*  CREATININE 2.26* 3.37*   < > 4.41* 4.41*  CALCIUM 8.4* 8.5*   < > 8.4* 8.0*  MG 1.8 1.9  --   --   --   PHOS  --   --   --  5.9* 5.6*   < > = values in this interval not displayed.   Lipid Panel:  Recent Labs  Lab 05/15/22 1256  CHOL 324*  TRIG 102  HDL 50  CHOLHDL 6.5  VLDL 20  LDLCALC 254*   HgbA1c:  Recent Labs  Lab 05/15/22 1256  HGBA1C 6.4*   Urine Drug Screen: No results for input(s): "LABOPIA", "COCAINSCRNUR", "LABBENZ", "AMPHETMU", "THCU", "LABBARB" in the last 168 hours.  Alcohol Level  Recent Labs  Lab 05/15/22 1215  ETH <10    IMAGING past 24 hours No results found.  PHYSICAL EXAM General: Alert, well-developed well-nourished elderly patient in no acute  distress Respiratory: Regular, unlabored respirations on room air  NEURO:  Mental Status: AA&Ox3  Speech/Language: speech is with some dysarthria  Cranial Nerves:  II: PERRL.  III, IV, VI: EOMI. Eyelids elevate symmetrically.  V: Sensation is intact to light touch and symmetrical to face.  VII: Left facial droop present VIII: hearing intact to voice. IX, X: Voice is dysarthric XII: tongue is midline without fasciculations. Motor: 5/5 strength to bilateral upper extremities and right lower extremity, 4+ out of 5 strength to left lower extremity Tone: is normal and bulk is normal Sensation- Intact to light touch bilaterally.  Gait- deferred   ASSESSMENT/PLAN [name] is a 76 y.o. female with history of multiple prior strokes, hypertension, arthritis, hyperlipidemia, osteomyelitis, PVD, diabetes, esophagitis and anxiety presenting after being found down at home with dysarthria and left-sided facial droop. The day before yesterday, family noted that she seemed unsteady and had  slurred speech.  Yesterday, she was found on the floor in her house and was brought to the emergency department.  She was noted to have a left facial droop and left upper visual field cut.  Blood pressure was elevated with systolics in the 130Q, and CT head revealed trace subarachnoid hemorrhage at the right vertex.  MRI demonstrated DWI artifact at right vertex and area of subarachnoid hemorrhage with scattered small acute right MCA territory infarcts.  Stroke:  right MCA small scattered infarcts with trace right frontal subarachnoid hemorrhage Etiology: Likely embolic Code Stroke CT head trace subarachnoid hemorrhage at right vertex CTA head & neck no LVO, high-grade stenosis at origin of right vertebral artery, high-grade stenosis of distal right V4, bilateral P2 moderate stenosis, mild narrowing of distal M1 segments bilaterally MRI stable trace subarachnoid hemorrhage at right vertex with mild DWI  artifact there, also scattered small acute right MCA territory infarcts 2D Echo EF 55 to 65%, grade 2 diastolic dysfunction, mildly dilated left atrium, normal atrial septum LDL 254 HgbA1c 6.4 Will consider loop recorder versus 30-day cardiac monitor to rule out A-fib at discharge VTE prophylaxis -SCDs    Diet   DIET DYS 3 Room service appropriate? Yes with Assist; Fluid consistency: Thin   clopidogrel 75 mg daily prior to admission, now on no antithrombotic do to [facility]  Therapy recommendations: Home health  Disposition: Pending  Hypertension Home meds: Indapamide 2.5 mg daily, amlodipine 10 mg once daily, carvedilol 6.25 mg daily, enalapril 20 mg at bedtime Start amlodipine 10 mg daily and carvedilol 6.25 mg daily as well as enalapril 20 mg at bedtime Stable off Cleviprex Keep systolic blood pressure less than 160 Long-term BP goal normotensive  Hyperlipidemia Home meds: Ezetimibe 10 mg daily, resumed in hospital LDL 254, goal < 70 Add rosuvastatin 40 mg daily Consider referral to the lipid clinic if high intensity statin does not lower LDL to goal Continue statin at discharge  Diabetes type II Controlled Home meds: None HgbA1c 6.4, goal < 7.0 CBGs Recent Labs    05/20/22 0508  GLUCAP 117*    SSI  Other Stroke Risk Factors Advanced Age >/= 30  Former cigarette smoker  Other Active Problems Diarrhea  Imodium as needed  Placed patient on enteric contact precautions and test for C. difficile as she was on Dificid at home AKI on CKD  Cr. 2.26-> 3.37 -> 4.27 Hospitalist team consulted for assistance   Hospital day # 5    Patient remains neurologically stable but has worsening renal function.  Will get medical hospitalist team consult for medical management help.  Continue strict control of blood pressure systolic goal below 784.  Marland Kitchen  Appreciate nephrology consult. No family at the bedside for discussion.      Lauren Contras, MD Medical Director [facility] Stroke  Center Pager: [phone] 05/20/2022 3:27 PM   To contact Stroke Continuity provider, please refer to [email or web address] After hours, contact General Neurology

## 2022-05-20 NOTE — Progress Notes (Signed)
Pt has had no urine output on my shift despite finishing up 1L of fluid. Bladder scan revealed 20mL present in the bladder.

## 2022-05-20 NOTE — Progress Notes (Signed)
[facility] progress note                                                                              Lauren Craig, is a 76 y.o. female, DOB - March 19, 1946, QAS:[id number] Admit date - 05/15/2022    Outpatient Primary MD for the patient is Martyn Malay, MD  LOS - 5  days  Chief Complaint  Patient presents with   Code Stroke       Brief summary   Lauren Craig is a 77 y.o. female with history of multiple prior strokes, hypertension, arthritis, hyperlipidemia, osteomyelitis, PVD, diabetes, esophagitis and anxiety presenting after being found down at home with dysarthria and left-sided facial droop. She has a hemorrhage that is likely a transformation from an embolic stroke. She was supposed to be discharged yesterday but her creatinine is getting worse. Cr was around 2.1 when she was admitted.  Creatinine worsened to 4.4 and TRH was consulted.   Assessment & Plan    Principal Problem:   ICH (intracerebral hemorrhage) ([place]) -Prior history of CVA, presented with strokelike symptoms on 12/17 and was found to have [place].  Stabilized from neurological standpoint. 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  Imaging per nephrology    Controlled diabetes mellitus type 2 with complications ([place]) -Recent hemoglobin A1c 6.4, -CBG stable, continue SSI Recent Labs    05/17/22 1205 05/20/22 0508  GLUCAP 125* 117*        HTN (hypertension) Difficult to Control  - BP readings have been elevated, increase hydralazine to 50 mg 3 times  daily -Continue rest, Coreg    Hyperlipidemia -Continue Crestor, Zetia    PVD (peripheral vascular disease) ([facility]) -Status post left fifth toe amputation   Estimated body mass index is 28.76 kg/m as calculated from the following:   Height as of this encounter: 5\' 4"  (1.626 m).   Weight as of this encounter: 76 kg.  Code Status: DNR DVT Prophylaxis:  SCD's Start: 05/15/22 1253   Level of Care: Level of care: Telemetry Medical Family Communication:  Disposition Plan:      Remains inpatient appropriate: Creatinine still trending up, 4.41, not at baseline   Procedures:  None  Consultants:   Nephrology  Antimicrobials: None    Medications  amLODipine  10 mg Oral Daily   carvedilol  6.25 mg Oral Q breakfast   ezetimibe  10 mg Oral Daily   hydrALAZINE  25 mg Oral Q8H   pantoprazole  40 mg Oral QHS   rosuvastatin  40 mg Oral Daily   sertraline  25 mg Oral Daily   sodium bicarbonate  650 mg Oral BID      Subjective:   Lauren Craig was seen and examined today.  Making urine, denies any specific complaints.  No acute events overnight.  No active nausea vomiting, diarrhea, abdominal pain.  No fevers. BP still elevated  Objective:   Vitals:   05/19/22 2315 05/20/22 0311 05/20/22 0345 05/20/22 0737  BP: (!) 143/50 (!) 176/54 (!) 145/44 (!) 160/51  Pulse: (!) 58 67  69  Resp: 16 16  18   Temp: 97.8 F (36.6 C) 98 F (36.7 C)  98.1 F (36.7 C)  TempSrc: Oral Oral    SpO2: 95% 97%  95%  Weight:      Height:        Intake/Output Summary (Last 24 hours) at 05/20/2022 1036 Last data filed at 05/20/2022 0950 Gross per 24 hour  Intake 200 ml  Output 405 ml  Net -205 ml     Wt Readings from Last 3 Encounters:  05/18/22 76 kg  04/05/22 72.3 kg  02/24/22 71.8 kg     Exam General: Alert and oriented x 3, NAD, speech dysarthric and slow Cardiovascular: S1 S2 auscultated,  RRR Respiratory: Clear to auscultation bilaterally Gastrointestinal: Soft, nontender,  nondistended, + bowel sounds Ext: no pedal edema bilaterally Neuro: 5/5 in bilateral upper extremities, RLE, 4/5 in LLE Psych: Normal affect     Data Reviewed:  I have personally reviewed following labs    CBC Lab Results  Component Value Date   WBC 7.5 05/20/2022   RBC 3.50 (L) 05/20/2022   HGB 9.4 (L) 05/20/2022   HCT 28.4 (L) 05/20/2022   MCV 81.1 05/20/2022   MCH 26.9 05/20/2022   PLT 223 05/20/2022   MCHC 33.1 05/20/2022   RDW 15.9 (H) 05/20/2022   LYMPHSABS 1.0 05/15/2022   MONOABS 0.4 05/15/2022   EOSABS 0.0 05/15/2022   BASOSABS 0.0 49/20/1007     Last metabolic panel Lab Results  Component Value Date   NA 138 05/20/2022   K 4.5 05/20/2022   CL 114 (H) 05/20/2022   CO2 16 (L) 05/20/2022   BUN 60 (H) 05/20/2022   CREATININE 4.41 (H) 05/20/2022   GLUCOSE 93 05/20/2022   GFRNONAA 10 (L) 05/20/2022   GFRAA >60 11/11/2019   CALCIUM 8.0 (L) 05/20/2022   PHOS 5.6 (H) 05/20/2022   PROT 6.9 05/15/2022   ALBUMIN 2.5 (L) 05/20/2022   LABGLOB 2.2 06/07/2021   AGRATIO 1.9 06/07/2021   BILITOT 0.8 05/15/2022   ALKPHOS 79 05/15/2022   AST 22 05/15/2022   ALT 14 05/15/2022   ANIONGAP 8 05/20/2022    CBG (last 3)  Recent Labs    05/17/22 1205 05/20/22 0508  GLUCAP 125* 117*      Coagulation Profile: Recent Labs  Lab 05/15/22 1213  INR 1.1     Radiology Studies: I have personally reviewed the imaging studies  No results found.     Estill Cotta M.D. [facility] 05/20/2022, 10:36 AM  Available via Epic secure chat 7am-7pm After 7 pm, please refer to night coverage provider listed on amion.

## 2022-05-20 NOTE — Plan of Care (Signed)
  Problem: Intracerebral Hemorrhage Tissue Perfusion: Goal: Complications of Intracerebral Hemorrhage will be minimized Outcome: Progressing   Problem: Coping: Goal: Will verbalize positive feelings about self Outcome: Progressing Goal: Will identify appropriate support needs Outcome: Progressing   Problem: Health Behavior/Discharge Planning: Goal: Ability to manage health-related needs will improve Outcome: Progressing   Problem: Self-Care: Goal: Ability to participate in self-care as condition permits will improve Outcome: Progressing Goal: Verbalization of feelings and concerns over difficulty with self-care will improve Outcome: Progressing   Problem: Nutrition: Goal: Risk of aspiration will decrease Outcome: Progressing Goal: Dietary intake will improve Outcome: Progressing   Problem: Education: Goal: Ability to describe self-care measures that may prevent or decrease complications (Diabetes Survival Skills Education) will improve Outcome: Progressing Goal: Individualized Educational Video(s) Outcome: Progressing   Problem: Coping: Goal: Ability to adjust to condition or change in health will improve Outcome: Progressing   Problem: Fluid Volume: Goal: Ability to maintain a balanced intake and output will improve Outcome: Progressing   Problem: Health Behavior/Discharge Planning: Goal: Ability to identify and utilize available resources and services will improve Outcome: Progressing Goal: Ability to manage health-related needs will improve Outcome: Progressing   Problem: Metabolic: Goal: Ability to maintain appropriate glucose levels will improve Outcome: Progressing   Problem: Nutritional: Goal: Maintenance of adequate nutrition will improve Outcome: Progressing Goal: Progress toward achieving an optimal weight will improve Outcome: Progressing   Problem: Skin Integrity: Goal: Risk for impaired skin integrity will decrease Outcome: Progressing    Problem: Tissue Perfusion: Goal: Adequacy of tissue perfusion will improve Outcome: Progressing   Problem: Education: Goal: Knowledge of General Education information will improve Description: Including pain rating scale, medication(s)/side effects and non-pharmacologic comfort measures Outcome: Progressing   Problem: Health Behavior/Discharge Planning: Goal: Ability to manage health-related needs will improve Outcome: Progressing   Problem: Clinical Measurements: Goal: Ability to maintain clinical measurements within normal limits will improve Outcome: Progressing Goal: Will remain free from infection Outcome: Progressing Goal: Diagnostic test results will improve Outcome: Progressing Goal: Cardiovascular complication will be avoided Outcome: Progressing   Problem: Activity: Goal: Risk for activity intolerance will decrease Outcome: Progressing   Problem: Nutrition: Goal: Adequate nutrition will be maintained Outcome: Progressing   Problem: Elimination: Goal: Will not experience complications related to urinary retention Outcome: Progressing   Problem: Pain Managment: Goal: General experience of comfort will improve Outcome: Progressing   Problem: Safety: Goal: Ability to remain free from injury will improve Outcome: Progressing   Problem: Skin Integrity: Goal: Risk for impaired skin integrity will decrease Outcome: Progressing   Problem: Education: Goal: Knowledge of disease or condition will improve Outcome: Completed/Met   Problem: Self-Care: Goal: Ability to communicate needs accurately will improve Outcome: Completed/Met   Problem: Elimination: Goal: Will not experience complications related to bowel motility Outcome: Completed/Met

## 2022-05-20 NOTE — Progress Notes (Signed)
Mobility Specialist: Progress Note   05/20/22 1119  Mobility  Activity Ambulated with assistance in hallway  Level of Assistance Minimal assist, patient does 75% or more  Assistive Device Front wheel walker  Distance Ambulated (ft) 40 ft  Activity Response Tolerated well  Mobility Referral Yes  $Mobility charge 1 Mobility   Pt received in the bed and agreeable to mobility. MinA with bed mobility as well as to stand. No c/o throughout. Mildly unsteady when turning but no overt LOB. Pt to the chair after session with call bell and phone at her side. Chair alarm is on.   Screven Berneta Sconyers Mobility Specialist Please contact via SecureChat or Rehab office at [phone]

## 2022-05-20 NOTE — Progress Notes (Signed)
[facility] Progress Note   Assessment/ Plan:   76 year old WF-  vasculopath-  presents with new CVA/SAH and has developed Aon CRF in house  1.Renal- baseline crt 1.5 to 1.8 with history of mild proteinuria-  likely due to HTN.  Now with A on CRF in the setting of CVA and SAH-  events noted acute drop in BP and contrasted CT on 12/17.  Crt still rising but non oliguric.  I suspect ATN plus contrast mediated AKI.  Will check urine but with good self reported UOP will not check imaging.    - appears volume replete- will stop fluids today 2. Hypertension/volume  - now with reasonable control-  on amlodipine/coreg and hydralazine-  no changes made to meds.   3. Metabolic acidosis-  will start sodium bicarb  4. Anemia  - hgb over 10-  no action needed at this time  5.  Neuroo- s/p CVA and also SAH-  seems to be doing well-  planning for discharge when renal function settles out    Subjective:    Seen in room. Just worked with Pharmacist, community, now sleeping.     Objective:   BP (!) 142/47 (BP Location: Right Arm)   Pulse 60   Temp 98.1 F (36.7 C) (Oral)   Resp 17   Ht 5\' 4"  (1.626 m)   Wt 76 kg   SpO2 96%   BMI 28.76 kg/m   Intake/Output Summary (Last 24 hours) at 05/20/2022 1445 Last data filed at 05/20/2022 0950 Gross per 24 hour  Intake 200 ml  Output 405 ml  Net -205 ml   Weight change:   Physical Exam: Gen:NAD, sitting in bed CVS: rrr Resp: clear Abd: soft Ext: no LE edema   Imaging: No results found.  Labs: BMET Recent Labs  Lab 05/15/22 1213 05/15/22 1248 05/16/22 0624 05/18/22 0405 05/19/22 0443 05/19/22 1619 05/20/22 0303  NA 136 136 137 136 138 135 138  K 5.3* 5.6* 5.0 4.6 4.7 4.8 4.5  CL 108 109 112* 109 113* 112* 114*  CO2 19*  --  16* 16* 15* 12* 16*  GLUCOSE 182* 175* 103* 120* 111* 123* 93  BUN 42* 39* 43* 50* 55* 58* 60*  CREATININE 2.10* 2.10* 2.26* 3.37* 4.27* 4.41* 4.41*  CALCIUM 9.1  --  8.4* 8.5* 8.3* 8.4* 8.0*   PHOS  --   --   --   --   --  5.9* 5.6*   CBC Recent Labs  Lab 05/15/22 1213 05/15/22 1248 05/16/22 0624 05/20/22 0306  WBC 10.2  --  6.8 7.5  NEUTROABS 8.8*  --   --   --   HGB 11.2* 11.9* 10.1* 9.4*  HCT 34.5* 35.0* 30.8* 28.4*  MCV 81.8  --  81.9 81.1  PLT 247  --  213 223    Medications:     amLODipine  10 mg Oral Daily   carvedilol  6.25 mg Oral Q breakfast   ezetimibe  10 mg Oral Daily   hydrALAZINE  50 mg Oral Q8H   pantoprazole  40 mg Oral QHS   rosuvastatin  40 mg Oral Daily   sertraline  25 mg Oral Daily   sodium bicarbonate  650 mg Oral BID    Madelon Lips MD 05/20/2022, 2:45 PM

## 2022-05-21 DIAGNOSIS — I609 Nontraumatic subarachnoid hemorrhage, unspecified: Secondary | ICD-10-CM | POA: Diagnosis not present

## 2022-05-21 DIAGNOSIS — E118 Type 2 diabetes mellitus with unspecified complications: Secondary | ICD-10-CM | POA: Diagnosis not present

## 2022-05-21 DIAGNOSIS — I619 Nontraumatic intracerebral hemorrhage, unspecified: Secondary | ICD-10-CM | POA: Diagnosis not present

## 2022-05-21 DIAGNOSIS — R2990 Unspecified symptoms and signs involving the nervous system: Secondary | ICD-10-CM | POA: Diagnosis not present

## 2022-05-21 DIAGNOSIS — I63231 Cerebral infarction due to unspecified occlusion or stenosis of right carotid arteries: Secondary | ICD-10-CM | POA: Diagnosis not present

## 2022-05-21 DIAGNOSIS — N170 Acute kidney failure with tubular necrosis: Secondary | ICD-10-CM | POA: Diagnosis not present

## 2022-05-21 LAB — URINALYSIS, ROUTINE W REFLEX MICROSCOPIC
Bilirubin Urine: NEGATIVE
Glucose, UA: 50 mg/dL — AB
Hgb urine dipstick: NEGATIVE
Ketones, ur: NEGATIVE mg/dL
Leukocytes,Ua: NEGATIVE
Nitrite: NEGATIVE
Protein, ur: 100 mg/dL — AB
Specific Gravity, Urine: 1.012 (ref 1.005–1.030)
pH: 5 (ref 5.0–8.0)

## 2022-05-21 LAB — RENAL FUNCTION PANEL
Albumin: 2.4 g/dL — ABNORMAL LOW (ref 3.5–5.0)
Anion gap: 8 (ref 5–15)
BUN: 59 mg/dL — ABNORMAL HIGH (ref 8–23)
CO2: 15 mmol/L — ABNORMAL LOW (ref 22–32)
Calcium: 8.2 mg/dL — ABNORMAL LOW (ref 8.9–10.3)
Chloride: 115 mmol/L — ABNORMAL HIGH (ref 98–111)
Creatinine, Ser: 4.64 mg/dL — ABNORMAL HIGH (ref 0.44–1.00)
GFR, Estimated: 9 mL/min — ABNORMAL LOW (ref >60)
Glucose, Bld: 114 mg/dL — ABNORMAL HIGH (ref 70–99)
Phosphorus: 5.5 mg/dL — ABNORMAL HIGH (ref 2.5–4.6)
Potassium: 4.6 mmol/L (ref 3.5–5.1)
Sodium: 138 mmol/L (ref 135–145)

## 2022-05-21 MED ORDER — ONDANSETRON HCL 4 MG/2ML IJ SOLN
4.0000 mg | Freq: Once | INTRAMUSCULAR | Status: AC
  Administered 2022-05-21: 4 mg via INTRAVENOUS
  Filled 2022-05-21: qty 2

## 2022-05-21 MED ORDER — ASPIRIN 81 MG PO TBEC
81.0000 mg | DELAYED_RELEASE_TABLET | Freq: Every day | ORAL | Status: DC
  Administered 2022-05-22 – 2022-05-31 (×3): 81 mg via ORAL
  Filled 2022-05-21 (×9): qty 1

## 2022-05-21 MED ORDER — STERILE WATER FOR INJECTION IV SOLN
INTRAVENOUS | Status: AC
  Filled 2022-05-21: qty 1000
  Filled 2022-05-21 (×4): qty 150
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
## 2022-05-21 NOTE — Plan of Care (Signed)
  Problem: Intracerebral Hemorrhage Tissue Perfusion: Goal: Complications of Intracerebral Hemorrhage will be minimized Outcome: Progressing   Problem: Coping: Goal: Will verbalize positive feelings about self Outcome: Progressing   Problem: Nutrition: Goal: Risk of aspiration will decrease Outcome: Progressing Goal: Dietary intake will improve Outcome: Progressing   Problem: Education: Goal: Individualized Educational Video(s) Outcome: Progressing   Problem: Fluid Volume: Goal: Ability to maintain a balanced intake and output will improve Outcome: Progressing   Problem: Health Behavior/Discharge Planning: Goal: Ability to identify and utilize available resources and services will improve Outcome: Progressing Goal: Ability to manage health-related needs will improve Outcome: Progressing   Problem: Nutritional: Goal: Maintenance of adequate nutrition will improve Outcome: Progressing Goal: Progress toward achieving an optimal weight will improve Outcome: Progressing   Problem: Skin Integrity: Goal: Risk for impaired skin integrity will decrease Outcome: Progressing   Problem: Tissue Perfusion: Goal: Adequacy of tissue perfusion will improve Outcome: Progressing   Problem: Health Behavior/Discharge Planning: Goal: Ability to manage health-related needs will improve Outcome: Progressing   Problem: Clinical Measurements: Goal: Ability to maintain clinical measurements within normal limits will improve Outcome: Progressing Goal: Will remain free from infection Outcome: Progressing Goal: Diagnostic test results will improve Outcome: Progressing   Problem: Nutrition: Goal: Adequate nutrition will be maintained Outcome: Progressing   Problem: Elimination: Goal: Will not experience complications related to urinary retention Outcome: Progressing   Problem: Pain Managment: Goal: General experience of comfort will improve Outcome: Progressing   Problem:  Safety: Goal: Ability to remain free from injury will improve Outcome: Progressing   Problem: Skin Integrity: Goal: Risk for impaired skin integrity will decrease Outcome: Progressing

## 2022-05-21 NOTE — Progress Notes (Signed)
[facility] progress note                                                                              Lauren Craig, is a 76 y.o. female, DOB - [birth year], WUJ:[id number] Admit date - 05/15/2022    Outpatient Primary MD for the patient is Martyn Malay, MD  LOS - 6  days  Chief Complaint  Patient presents with   Code Stroke       Brief summary   [name] is a 76 y.o. female with history of multiple prior strokes, hypertension, arthritis, hyperlipidemia, osteomyelitis, PVD, diabetes, esophagitis and anxiety presenting after being found down at home with dysarthria and left-sided facial droop. She has a hemorrhage that is likely a transformation from an embolic stroke. She was supposed to be discharged yesterday but her creatinine is getting worse. Cr was around 2.1 when she was admitted.  Creatinine worsened to 4.4 and TR[facility] was consulted.   Assessment & Plan    Principal Problem:   ICH (intracerebral hemorrhage) ([place]) -Prior history of CVA, presented with strokelike symptoms on 12/17 and was found to have Lauren Craig.  Stabilized from neurological standpoint. 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  Creatinine trended up to 4.41 on 12/21 -Nephrology was consulted, patient was placed on IV fluids, held on 12/22.  -Creatinine continues to trend up, 4.64 today, having nausea and vomiting, resumed IV fluids   Nausea vomiting -No abdominal pain, fevers or diarrhea.  Patient reported BM 2 days ago.  On exam, patient dry heaving and reported vomiting, not able to tolerate diet today -Placed on Zofran prn and started on IV fluids    Controlled diabetes mellitus type 2  with complications (HCC) -Recent hemoglobin A1c 6.4, -Continue sliding scale insulin, sensitive, CBGs stable    HTN (hypertension) Difficult to Control  -BP readings elevated, hydralazine increased to 50 mg 3 times daily on 12/22, continue Norvasc 10 mg daily, Coreg 6.25 mg twice daily     Hyperlipidemia -Continue Crestor, Zetia    PVD (peripheral vascular disease) ([place]) -Status post left fifth toe amputation   Estimated body mass index is 28.76 kg/m as calculated from the following:   Height as of this encounter: 5\' 4"  (1.626 m).   Weight as of this encounter: 76 kg.  Code Status: DNR DVT Prophylaxis:  SCD's Start: 05/15/22 1253   Level of Care: Level of care: Telemetry Medical Family Communication:  Disposition Plan:      Remains inpatient appropriate: Creatinine continues to trend up, 4.6  Procedures:  None  Consultants:   Nephrology Admitted by stroke service  Antimicrobials: None    Medications  amLODipine  10 mg Oral Daily   carvedilol  6.25 mg Oral Q breakfast   ezetimibe  10 mg Oral Daily   hydrALAZINE  50 mg Oral Q8H   pantoprazole  40 mg Oral QHS  rosuvastatin  40 mg Oral Daily   sertraline  25 mg Oral Daily   sodium bicarbonate  650 mg Oral BID      Subjective:   Lauren Craig was seen and examined today.  Having nausea and vomiting, unable to tolerate breakfast.  No acute chest pain, shortness of breath, dizziness.  BP readings elevated  Objective:   Vitals:   05/20/22 2135 05/21/22 0039 05/21/22 0527 05/21/22 0733  BP: (!) 160/53 (!) 152/42 (!) 158/54 (!) 172/51  Pulse: 65 63 69 73  Resp: 16 14 16 17   Temp: 98.4 F (36.9 C) 98 F (36.7 C) 98.2 F (36.8 C) 98.2 F (36.8 C)  TempSrc: Oral Oral Oral Oral  SpO2: 95% 96% 94% 94%  Weight:      Height:        Intake/Output Summary (Last 24 hours) at 05/21/2022 1028 Last data filed at 05/21/2022 8366 Gross per 24 hour  Intake --  Output 150 ml  Net -150 ml     Wt Readings from  Last 3 Encounters:  05/18/22 76 kg  04/05/22 72.3 kg  02/24/22 71.8 kg    Physical Exam General: Alert and oriented x 3, feels uncomfortable due to nausea, dysarthria, slow speech Cardiovascular: S1 S2 clear, RRR.  Respiratory: CTAB, no wheezing Gastrointestinal: Soft, nontender, nondistended, NBS Ext: no pedal edema bilaterally Neuro: left LE weakness Psych: Normal affect   Data Reviewed:  I have personally reviewed following labs    CBC Lab Results  Component Value Date   WBC 7.5 05/20/2022   RBC 3.50 (L) 05/20/2022   HGB 9.4 (L) 05/20/2022   HCT 28.4 (L) 05/20/2022   MCV 81.1 05/20/2022   MCH 26.9 05/20/2022   PLT 223 05/20/2022   MCHC 33.1 05/20/2022   RDW 15.9 (H) 05/20/2022   LYMPHSABS 1.0 05/15/2022   MONOABS 0.4 05/15/2022   EOSABS 0.0 05/15/2022   BASOSABS 0.0 29/47/6546     Last metabolic panel Lab Results  Component Value Date   NA 138 05/21/2022   K 4.6 05/21/2022   CL 115 (H) 05/21/2022   CO2 15 (L) 05/21/2022   BUN 59 (H) 05/21/2022   CREATININE 4.64 (H) 05/21/2022   GLUCOSE 114 (H) 05/21/2022   GFRNONAA 9 (L) 05/21/2022   GFRAA >60 11/11/2019   CALCIUM 8.2 (L) 05/21/2022   PHOS 5.5 (H) 05/21/2022   PROT 6.9 05/15/2022   ALBUMIN 2.4 (L) 05/21/2022   LABGLOB 2.2 06/07/2021   AGRATIO 1.9 06/07/2021   BILITOT 0.8 05/15/2022   ALKPHOS 79 05/15/2022   AST 22 05/15/2022   ALT 14 05/15/2022   ANIONGAP 8 05/21/2022    CBG (last 3)  Recent Labs    05/20/22 0508  GLUCAP 117*      Coagulation Profile: Recent Labs  Lab 05/15/22 1213  INR 1.1     Radiology Studies: I have personally reviewed the imaging studies  No results found.     Estill Cotta M.D. [facility] 05/21/2022, 10:28 AM  Available via Epic secure chat 7am-7pm After 7 pm, please refer to night coverage provider listed on amion.

## 2022-05-21 NOTE — Progress Notes (Signed)
[facility] Progress Note   Assessment/ Plan:   76 year old WF-  vasculopath-  presents with new CVA/SAH and has developed Aon CRF in house  1.Renal- baseline crt 1.5 to 1.8 with history of mild proteinuria-  likely due to HTN.  Now with A on CRF in the setting of CVA and SAH-  events noted acute drop in BP and contrasted CT on 12/17.  Crt still rising but non oliguric.  I suspect ATN plus contrast mediated AKI.  Will check urine but with good self reported UOP will not check imaging.    - back on IVFs with n/v/d- this is fine 2. Hypertension/volume  - now with reasonable control-  on amlodipine/coreg and hydralazine-  no changes made to meds.   3. Metabolic acidosis-  will start sodium bicarb  4. Anemia  - hgb over 10-  no action needed at this time  5.  Neuroo- s/p CVA and also SAH-  seems to be doing well-  planning for discharge when renal function settles out 6.  Diarrhea: on imodium    Subjective:    Having some n/v/d overnight and into AM.  Cr up a tad to 4.6.  IVFs put back on- agree.      Objective:   BP (!) 157/52 (BP Location: Right Arm)   Pulse 61   Temp 98.5 F (36.9 C) (Oral)   Resp 17   Ht 5\' 4"  (1.626 m)   Wt 76 kg   SpO2 95%   BMI 28.76 kg/m   Intake/Output Summary (Last 24 hours) at 05/21/2022 1553 Last data filed at 05/21/2022 8416 Gross per 24 hour  Intake --  Output 150 ml  Net -150 ml   Weight change:   Physical Exam: Gen:NAD, sitting in bed CVS: rrr Resp: clear Abd: soft Ext: no LE edema NEURO: ++ Dysarthria.   Imaging: No results found.  Labs: BMET Recent Labs  Lab 05/15/22 1213 05/15/22 1248 05/16/22 0624 05/18/22 0405 05/19/22 0443 05/19/22 1619 05/20/22 0303 05/21/22 0235  NA 136 136 137 136 138 135 138 138  K 5.3* 5.6* 5.0 4.6 4.7 4.8 4.5 4.6  CL 108 109 112* 109 113* 112* 114* 115*  CO2 19*  --  16* 16* 15* 12* 16* 15*  GLUCOSE 182* 175* 103* 120* 111* 123* 93 114*  BUN 42* 39* 43* 50* 55* 58* 60* 59*   CREATININE 2.10* 2.10* 2.26* 3.37* 4.27* 4.41* 4.41* 4.64*  CALCIUM 9.1  --  8.4* 8.5* 8.3* 8.4* 8.0* 8.2*  PHOS  --   --   --   --   --  5.9* 5.6* 5.5*   CBC Recent Labs  Lab 05/15/22 1213 05/15/22 1248 05/16/22 0624 05/20/22 0306  WBC 10.2  --  6.8 7.5  NEUTROABS 8.8*  --   --   --   HGB 11.2* 11.9* 10.1* 9.4*  HCT 34.5* 35.0* 30.8* 28.4*  MCV 81.8  --  81.9 81.1  PLT 247  --  213 223    Medications:     amLODipine  10 mg Oral Daily   carvedilol  6.25 mg Oral Q breakfast   ezetimibe  10 mg Oral Daily   hydrALAZINE  50 mg Oral Q8H   pantoprazole  40 mg Oral QHS   rosuvastatin  40 mg Oral Daily   sertraline  25 mg Oral Daily   sodium bicarbonate  650 mg Oral BID    Madelon Lips MD 05/21/2022, 3:53 PM

## 2022-05-21 NOTE — Plan of Care (Signed)
  Problem: Intracerebral Hemorrhage Tissue Perfusion: Goal: Complications of Intracerebral Hemorrhage will be minimized Outcome: Progressing   Problem: Coping: Goal: Will verbalize positive feelings about self Outcome: Progressing   Problem: Nutrition: Goal: Risk of aspiration will decrease Outcome: Progressing Goal: Dietary intake will improve Outcome: Progressing   Problem: Education: Goal: Individualized Educational Video(s) Outcome: Progressing   Problem: Fluid Volume: Goal: Ability to maintain a balanced intake and output will improve Outcome: Progressing   Problem: Health Behavior/Discharge Planning: Goal: Ability to identify and utilize available resources and services will improve Outcome: Progressing Goal: Ability to manage health-related needs will improve Outcome: Progressing   Problem: Nutritional: Goal: Maintenance of adequate nutrition will improve Outcome: Progressing Goal: Progress toward achieving an optimal weight will improve Outcome: Progressing   Problem: Skin Integrity: Goal: Risk for impaired skin integrity will decrease Outcome: Progressing   Problem: Tissue Perfusion: Goal: Adequacy of tissue perfusion will improve Outcome: Progressing   Problem: Health Behavior/Discharge Planning: Goal: Ability to manage health-related needs will improve Outcome: Progressing   Problem: Clinical Measurements: Goal: Ability to maintain clinical measurements within normal limits will improve Outcome: Progressing Goal: Will remain free from infection Outcome: Progressing Goal: Diagnostic test results will improve Outcome: Progressing   Problem: Nutrition: Goal: Adequate nutrition will be maintained Outcome: Progressing   Problem: Elimination: Goal: Will not experience complications related to urinary retention Outcome: Progressing   Problem: Pain Managment: Goal: General experience of comfort will improve Outcome: Progressing   Problem:  Safety: Goal: Ability to remain free from injury will improve Outcome: Progressing   Problem: Skin Integrity: Goal: Risk for impaired skin integrity will decrease Outcome: Progressing   Problem: Coping: Goal: Will identify appropriate support needs Outcome: Completed/Met   Problem: Health Behavior/Discharge Planning: Goal: Ability to manage health-related needs will improve Outcome: Completed/Met   Problem: Self-Care: Goal: Ability to participate in self-care as condition permits will improve Outcome: Completed/Met Goal: Verbalization of feelings and concerns over difficulty with self-care will improve Outcome: Completed/Met   Problem: Education: Goal: Ability to describe self-care measures that may prevent or decrease complications (Diabetes Survival Skills Education) will improve Outcome: Completed/Met   Problem: Coping: Goal: Ability to adjust to condition or change in health will improve Outcome: Completed/Met   Problem: Metabolic: Goal: Ability to maintain appropriate glucose levels will improve Outcome: Completed/Met   Problem: Education: Goal: Knowledge of General Education information will improve Description: Including pain rating scale, medication(s)/side effects and non-pharmacologic comfort measures Outcome: Completed/Met   Problem: Clinical Measurements: Goal: Cardiovascular complication will be avoided Outcome: Completed/Met   Problem: Activity: Goal: Risk for activity intolerance will decrease Outcome: Completed/Met

## 2022-05-22 ENCOUNTER — Inpatient Hospital Stay (HOSPITAL_COMMUNITY): Payer: PPO

## 2022-05-22 DIAGNOSIS — E118 Type 2 diabetes mellitus with unspecified complications: Secondary | ICD-10-CM | POA: Diagnosis not present

## 2022-05-22 DIAGNOSIS — E7800 Pure hypercholesterolemia, unspecified: Secondary | ICD-10-CM

## 2022-05-22 DIAGNOSIS — I639 Cerebral infarction, unspecified: Secondary | ICD-10-CM

## 2022-05-22 DIAGNOSIS — R2990 Unspecified symptoms and signs involving the nervous system: Secondary | ICD-10-CM | POA: Diagnosis not present

## 2022-05-22 DIAGNOSIS — N170 Acute kidney failure with tubular necrosis: Secondary | ICD-10-CM | POA: Diagnosis not present

## 2022-05-22 DIAGNOSIS — I609 Nontraumatic subarachnoid hemorrhage, unspecified: Secondary | ICD-10-CM | POA: Diagnosis not present

## 2022-05-22 DIAGNOSIS — I619 Nontraumatic intracerebral hemorrhage, unspecified: Secondary | ICD-10-CM | POA: Diagnosis not present

## 2022-05-22 LAB — RENAL FUNCTION PANEL
Albumin: 2.3 g/dL — ABNORMAL LOW (ref 3.5–5.0)
Anion gap: 6 (ref 5–15)
BUN: 51 mg/dL — ABNORMAL HIGH (ref 8–23)
CO2: 22 mmol/L (ref 22–32)
Calcium: 7.9 mg/dL — ABNORMAL LOW (ref 8.9–10.3)
Chloride: 109 mmol/L (ref 98–111)
Creatinine, Ser: 3.65 mg/dL — ABNORMAL HIGH (ref 0.44–1.00)
GFR, Estimated: 12 mL/min — ABNORMAL LOW (ref >60)
Glucose, Bld: 102 mg/dL — ABNORMAL HIGH (ref 70–99)
Phosphorus: 4.6 mg/dL (ref 2.5–4.6)
Potassium: 3.9 mmol/L (ref 3.5–5.1)
Sodium: 137 mmol/L (ref 135–145)

## 2022-05-22 LAB — URINE CULTURE: Culture: <10000 — AB

## 2022-05-22 LAB — PROCALCITONIN: Procalcitonin: <0.1 ng/mL

## 2022-05-22 MED ORDER — SODIUM CHLORIDE 0.9 % IV SOLN
2.0000 g | INTRAVENOUS | Status: DC
  Administered 2022-05-22 – 2022-05-23 (×2): 2 g via INTRAVENOUS
  Filled 2022-05-22 (×2): qty 20

## 2022-05-22 MED ORDER — SUCRALFATE 1 GM/10ML PO SUSP
1.0000 g | Freq: Three times a day (TID) | ORAL | Status: DC
  Administered 2022-05-22 – 2022-05-31 (×7): 1 g via ORAL
  Filled 2022-05-22 (×40): qty 10

## 2022-05-22 MED ORDER — HYDRALAZINE HCL 50 MG PO TABS
100.0000 mg | ORAL_TABLET | Freq: Three times a day (TID) | ORAL | Status: DC
  Administered 2022-05-22 (×2): 100 mg via ORAL
  Filled 2022-05-22 (×3): qty 2

## 2022-05-22 MED ORDER — PANTOPRAZOLE SODIUM 40 MG IV SOLR
40.0000 mg | Freq: Two times a day (BID) | INTRAVENOUS | Status: DC
  Administered 2022-05-22 – 2022-05-31 (×19): 40 mg via INTRAVENOUS
  Filled 2022-05-22 (×19): qty 10

## 2022-05-22 MED ORDER — METRONIDAZOLE 500 MG PO TABS
500.0000 mg | ORAL_TABLET | Freq: Two times a day (BID) | ORAL | Status: DC
  Administered 2022-05-22 (×2): 500 mg via ORAL
  Filled 2022-05-22 (×5): qty 1

## 2022-05-22 NOTE — Progress Notes (Signed)
[facility] Progress Note   Assessment/ Plan:   76 year old WF-  vasculopath-  presents with new CVA/SAH and has developed Aon CRF in house  1.Renal- baseline crt 1.5 to 1.8 with history of mild proteinuria-  likely due to HTN.  Now with A on CRF in the setting of CVA and SAH-  events noted acute drop in BP and contrasted CT on 12/17.  Crt still rising but non oliguric.  I suspect ATN plus contrast mediated AKI.    - cr improving on IVFs   - n/v/d better- CT scan results as in #6  - keep on IVFs until the AM, expect she can stop them then 2. Hypertension/volume  - now with reasonable control-  on amlodipine/coreg and hydralazine-  no changes made to meds.   3. Metabolic acidosis-  will start sodium bicarb  4. Anemia  - hgb over 10-  no action needed at this time  5.  Neuroo- s/p CVA and also SAH-  seems to be doing well-  planning for discharge when renal function settles out 6.  N/v/d: CT scan today 12/24 shows possible diverticulitis and pyelonephritis- abx have been ordered per primary    Subjective:    Cr has improved greatly on IVFs.  CT scan of abd/ pelvis shows diverticulosis with possible diverticulitis and possible pyelo- UA didn't look dirty.     Objective:   BP (!) 164/56 (BP Location: Right Arm)   Pulse 61   Temp 97.6 F (36.4 C) (Oral)   Resp 17   Ht 5\' 4"  (1.626 m)   Wt 76 kg   SpO2 94%   BMI 28.76 kg/m   Intake/Output Summary (Last 24 hours) at 05/22/2022 1406 Last data filed at 05/22/2022 8889 Gross per 24 hour  Intake 2433.95 ml  Output 400 ml  Net 2033.95 ml   Weight change:   Physical Exam: Gen:NAD, sitting in bed CVS: rrr Resp: clear Abd: soft Ext: no LE edema NEURO: ++ Dysarthria.   Imaging: CT ABDOMEN PELVIS WO CONTRAST  Result Date: 05/22/2022 CLINICAL DATA:  Abdominal pain with nausea vomiting and diarrhea. 12/17/2020 EXAM: CT ABDOMEN AND PELVIS WITHOUT CONTRAST TECHNIQUE: Multidetector CT imaging of the abdomen and pelvis  was performed following the standard protocol without IV contrast. RADIATION DOSE REDUCTION: This exam was performed according to the departmental dose-optimization program which includes automated exposure control, adjustment of the mA and/or kV according to patient size and/or use of iterative reconstruction technique. COMPARISON:  None Available. FINDINGS: Lower chest: Bibasilar dependent atelectasis with small bilateral pleural effusions. Hepatobiliary: No suspicious focal abnormality in the liver on this study without intravenous contrast. Gallbladder surgically absent. No intrahepatic or extrahepatic biliary dilation. Pancreas: No focal mass lesion. No dilatation of the main duct. No intraparenchymal cyst. No peripancreatic edema. Spleen: No splenomegaly. No focal mass lesion. Adrenals/Urinary Tract: No adrenal nodule or mass. 17 mm water density lesion lower pole right kidney is compatible with a cyst, stable since 10/28/2020. No followup imaging is recommended. Both kidney show heterogeneous parenchymal attenuation, nonspecific but pyelonephritis could have this appearance. No evidence for hydroureter. Bladder is moderately distended. Stomach/Bowel: Stomach is unremarkable. No gastric wall thickening. No evidence of outlet obstruction. Duodenum is normally positioned as is the ligament of Treitz. No small bowel wall thickening. No small bowel dilatation. The terminal ileum is normal. The appendix is normal. No gross colonic mass. No colonic wall thickening. Left-sided diverticulosis evident. Subtle pericolonic stranding along the descending colon (51/3) raises  the question of diverticulitis. Vascular/Lymphatic: There is advanced atherosclerotic calcification of the abdominal aorta without aneurysm. There is no gastrohepatic or hepatoduodenal ligament lymphadenopathy. No retroperitoneal or mesenteric lymphadenopathy. No pelvic sidewall lymphadenopathy. Reproductive: Hysterectomy.  There is no adnexal mass.  Other: No intraperitoneal free fluid. Musculoskeletal: Bones are diffusely demineralized. Degenerative changes noted in both hips. IMPRESSION: 1. Left-sided diverticulosis with subtle pericolonic stranding along the descending colon. Imaging features raise the question of diverticulitis. 2. Both kidneys show heterogeneous parenchymal attenuation, nonspecific but pyelonephritis could have this appearance. 3. Bibasilar dependent atelectasis with small bilateral pleural effusions. 4.  Aortic Atherosclerosis (ICD10-I70.0). Electronically Signed   By: Misty Stanley M.D.   On: 05/22/2022 12:22    Labs: BMET Recent Labs  Lab 05/16/22 0624 05/18/22 0405 05/19/22 0443 05/19/22 1619 05/20/22 0303 05/21/22 0235 05/22/22 0228  NA 137 136 138 135 138 138 137  K 5.0 4.6 4.7 4.8 4.5 4.6 3.9  CL 112* 109 113* 112* 114* 115* 109  CO2 16* 16* 15* 12* 16* 15* 22  GLUCOSE 103* 120* 111* 123* 93 114* 102*  BUN 43* 50* 55* 58* 60* 59* 51*  CREATININE 2.26* 3.37* 4.27* 4.41* 4.41* 4.64* 3.65*  CALCIUM 8.4* 8.5* 8.3* 8.4* 8.0* 8.2* 7.9*  PHOS  --   --   --  5.9* 5.6* 5.5* 4.6   CBC Recent Labs  Lab 05/16/22 0624 05/20/22 0306  WBC 6.8 7.5  HGB 10.1* 9.4*  HCT 30.8* 28.4*  MCV 81.9 81.1  PLT 213 223    Medications:     amLODipine  10 mg Oral Daily   aspirin EC  81 mg Oral Daily   carvedilol  6.25 mg Oral Q breakfast   ezetimibe  10 mg Oral Daily   hydrALAZINE  100 mg Oral Q8H   pantoprazole (PROTONIX) IV  40 mg Intravenous Q12H   rosuvastatin  40 mg Oral Daily   sertraline  25 mg Oral Daily   sodium bicarbonate  650 mg Oral BID   sucralfate  1 g Oral TID WC & HS    Madelon Lips MD 05/22/2022, 2:06 PM
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
## 2022-05-22 NOTE — Progress Notes (Addendum)
[facility] progress note                                                                              Lauren Craig, is a 76 y.o. female, DOB - [birth year], LTJ:[id number] Admit date - 05/15/2022    Outpatient Primary MD for the patient is Martyn Malay, MD  LOS - 7  days  Chief Complaint  Patient presents with   Code Stroke       Brief summary   [name] is a 76 y.o. female with history of multiple prior strokes, hypertension, arthritis, hyperlipidemia, osteomyelitis, PVD, diabetes, esophagitis and anxiety presenting after being found down at home with dysarthria and left-sided facial droop. She has a hemorrhage that is likely a transformation from an embolic stroke. She was supposed to be discharged yesterday but her creatinine is getting worse. Cr was around 2.1 when she was admitted.  Creatinine worsened to 4.4 and TRH was consulted.   Assessment & Plan    Principal Problem:   ICH (intracerebral hemorrhage) ([place]) -Prior history of CVA, presented with strokelike symptoms on 12/17 and was found to have [place].  Stabilized from neurological standpoint. 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  Creatinine trended up to 4.41 on 12/21 -Nephrology consulted -Creatinine improving 3.65 today however still having persistent nausea, not eating well, will continue IV fluids  Nausea vomiting, chronic diarrhea with prior history of C. difficile colitis -No abdominal pain or fevers, had diarrhea x1 overnight. Per daughter, this is chronic issue for a year, has had extensive GI work-up, GE emptying study was normal, she had the EGD in 10/2020 which showed severe reflux  esophagitis, duodenitis.  Biopsy showed chronic gastritis, negative for H. pylori, no intestinal metaplasia or dysplasia or malignancy.  - Also, per the daughter, patient was treated last year for C. difficile colitis which had improved her symptoms.  Currently no fevers, leukocytosis or abdominal pain. -Obtain CT abdomen without contrast for further workup -Continue IVF, Zofran as needed -Added Protonix 40 mg IV twice daily, Carafate.  -If diarrhea worsening or CT abdomen shows colitis, will obtain C. difficile PCR Addendum 2:00pm CT abdomen showed left-sided diverticulosis with subtle pericolonic stranding along the descending colon ?  Diverticulitis, nonspecific parenchymal attenuation both kidneys ?  Pyelonephritis -Obtain procalcitonin, will place on IV Rocephin and Flagyl, UA showed no UTI    Controlled diabetes mellitus type 2 with complications (HCC) -Recent hemoglobin A1c 6.4, -CBG stable, continue SSI sensitive    HTN (hypertension) Difficult to Control  -BP readings continue to remain elevated likely due to anxiety and nausea/vomiting  -Increase hydralazine to 100 mg 3 times daily, continue Norvasc 10 mg daily, Coreg 6.25 mg twice daily     Hyperlipidemia -Continue Crestor, Zetia    PVD (peripheral vascular disease) ([place]) -Status post left fifth toe amputation   Estimated body mass index is 28.76  kg/m as calculated from the following:   Height as of this encounter: 5\' 4"  (1.626 m).   Weight as of this encounter: 76 kg.  Code Status: DNR DVT Prophylaxis:  SCD's Start: 05/15/22 1253   Level of Care: Level of care: Telemetry Medical Family Communication: Updated patient's daughter Arelene Moroni on phone today disposition Plan:      Remains inpatient appropriate: Creatinine improving  Procedures:  None  Consultants:   Nephrology Admitted by stroke service  Antimicrobials: None    Medications  amLODipine  10 mg Oral Daily   aspirin EC  81 mg Oral Daily    carvedilol  6.25 mg Oral Q breakfast   ezetimibe  10 mg Oral Daily   hydrALAZINE  50 mg Oral Q8H   pantoprazole  40 mg Oral QHS   rosuvastatin  40 mg Oral Daily   sertraline  25 mg Oral Daily   sodium bicarbonate  650 mg Oral BID      Subjective:   Lauren Craig was seen and examined today.  Having persistent nausea, does not like the taste of the food, had BM last night.  No abdominal pain, fevers.  BP readings elevated.    Objective:   Vitals:   05/21/22 2305 05/22/22 0347 05/22/22 0733 05/22/22 1111  BP: (!) 174/46 (!) 178/59 (!) 174/62 (!) 164/56  Pulse: 71 74 70 61  Resp: 14 14 17 17   Temp: 98.4 F (36.9 C) 98 F (36.7 C) 98.6 F (37 C) 97.6 F (36.4 C)  TempSrc: Oral Oral Oral Oral  SpO2: 95% 94% 91% 94%  Weight:      Height:        Intake/Output Summary (Last 24 hours) at 05/22/2022 1203 Last data filed at 05/22/2022 1761 Gross per 24 hour  Intake 2433.95 ml  Output 400 ml  Net 2033.95 ml     Wt Readings from Last 3 Encounters:  05/18/22 76 kg  04/05/22 72.3 kg  02/24/22 71.8 kg    Physical Exam General: Alert and oriented x 3, uncomfortable due to nausea Cardiovascular: S1 S2 clear, RRR.  Respiratory: CTAB, no wheezing, rales or rhonchi Gastrointestinal: Soft, nontender, nondistended, NBS Ext: no pedal edema bilaterally Neuro: left LE weakness Psych: anxious and uncomfortable due to nausea   Data Reviewed:  I have personally reviewed following labs    CBC Lab Results  Component Value Date   WBC 7.5 05/20/2022   RBC 3.50 (L) 05/20/2022   HGB 9.4 (L) 05/20/2022   HCT 28.4 (L) 05/20/2022   MCV 81.1 05/20/2022   MCH 26.9 05/20/2022   PLT 223 05/20/2022   MCHC 33.1 05/20/2022   RDW 15.9 (H) 05/20/2022   LYMPHSABS 1.0 05/15/2022   MONOABS 0.4 05/15/2022   EOSABS 0.0 05/15/2022   BASOSABS 0.0 60/73/7106     Last metabolic panel Lab Results  Component Value Date   NA 137 05/22/2022   K 3.9 05/22/2022   CL 109 05/22/2022   CO2 22  05/22/2022   BUN 51 (H) 05/22/2022   CREATININE 3.65 (H) 05/22/2022   GLUCOSE 102 (H) 05/22/2022   GFRNONAA 12 (L) 05/22/2022   GFRAA >60 11/11/2019   CALCIUM 7.9 (L) 05/22/2022   PHOS 4.6 05/22/2022   PROT 6.9 05/15/2022   ALBUMIN 2.3 (L) 05/22/2022   LABGLOB 2.2 06/07/2021   AGRATIO 1.9 06/07/2021   BILITOT 0.8 05/15/2022   ALKPHOS 79 05/15/2022   AST 22 05/15/2022   ALT 14 05/15/2022   ANIONGAP 6 05/22/2022  CBG (last 3)  Recent Labs    05/20/22 0508  GLUCAP 117*      Coagulation Profile: Recent Labs  Lab 05/15/22 1213  INR 1.1     Radiology Studies: I have personally reviewed the imaging studies  No results found.     Estill Cotta M.D. [facility] 05/22/2022, 12:03 PM  Available via Epic secure chat 7am-7pm After 7 pm, please refer to night coverage provider listed on amion.

## 2022-05-22 NOTE — Progress Notes (Signed)
Occupational Therapy Treatment Patient Details Name: Lauren Craig MRN: [id number] DOB: 01-Jun-1945 Today's Date: 05/22/2022   History of present illness Pt is a 76 y/o female presenting to the ED 12/17 with slurred speech and weakness, left facial droop and decreased vision L upper visual field.  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 Therapist educated patient on benefits of therapy and getting OOB and she agreed to sitting on EOB. Once on EOB patient was agreeable to transfer to recliner and grooming tasks. Patient appreciative of therapist at end of session. Patient would benefit from continued OT with HHOT once discharged home to address safety and independence with self care and functional transfers.     Recommendations for follow up therapy are one component of a multi-disciplinary discharge planning process, led by the attending physician.  Recommendations may be updated based on patient status, additional functional criteria and insurance authorization.    Follow Up Recommendations  Home health OT     Assistance Recommended at Discharge Frequent or constant Supervision/Assistance  Patient can return home with the following  A little help with walking and/or transfers;A little help with bathing/dressing/bathroom;Assistance with cooking/housework;Assist for transportation;Help with stairs or ramp for entrance   Equipment Recommendations  None recommended by OT    Recommendations for Other Services      Precautions / Restrictions Precautions Precautions: Fall Restrictions Weight Bearing Restrictions: No       Mobility Bed Mobility Overal bed mobility: Needs Assistance Bed Mobility: Supine to Sit     Supine to sit: Min  assist     General bed mobility comments: verbal cues and increased time with assistance to raise trunk    Transfers Overall transfer level: Needs assistance Equipment used: Rolling walker (2 wheels) Transfers: Sit to/from Stand, Bed to chair/wheelchair/BSC Sit to Stand: Min assist     Step pivot transfers: Min assist     General transfer comment: min assist to power up and to steady     Balance Overall balance assessment: Needs assistance Sitting-balance support: Single extremity supported, Feet supported Sitting balance-Leahy Scale: Fair Sitting balance - Comments: able to perform gown change seated on EOB   Standing balance support: Bilateral upper extremity supported, During functional activity Standing balance-Leahy Scale: Poor Standing balance comment: reliant on external support                           ADL either performed or assessed with clinical judgement   ADL Overall ADL's : Needs assistance/impaired     Grooming: Wash/dry hands;Wash/dry face;Oral care;Brushing hair;Supervision/safety;Sitting Grooming Details (indicate cue type and reason): in recliner         Upper Body Dressing : Supervision/safety;Sitting Upper Body Dressing Details (indicate cue type and reason): changed gown seated on EOB                        Extremity/Trunk Assessment              Vision       Perception     Praxis      Cognition Arousal/Alertness: Awake/alert Behavior During Therapy: WFL for tasks assessed/performed Overall Cognitive Status: Within Functional Limits for tasks assessed  General Comments: required max encouragement to participate        Exercises      Shoulder Instructions       General Comments      Pertinent Vitals/ Pain       Pain Assessment Pain Assessment: Faces Faces Pain Scale: No hurt Pain Intervention(s): Monitored during session  Home Living                                           Prior Functioning/Environment              Frequency  Min 2X/week        Progress Toward Goals  OT Goals(current goals can now be found in the care plan section)  Progress towards OT goals: Progressing toward goals  Acute Rehab OT Goals Patient Stated Goal: get better OT Goal Formulation: With patient Time For Goal Achievement: 06/06/22 Potential to Achieve Goals: Good ADL Goals Pt Will Perform Grooming: with supervision;standing Pt Will Perform Upper Body Bathing: with supervision;with set-up;sitting Pt Will Perform Lower Body Bathing: with set-up;with supervision;sit to/from stand Pt Will Perform Upper Body Dressing: with set-up;with supervision;sitting Pt Will Perform Lower Body Dressing: with set-up;with supervision;sit to/from stand Pt Will Transfer to Toilet: with supervision;ambulating;regular height toilet;grab bars Pt Will Perform Toileting - Clothing Manipulation and hygiene: with supervision;sit to/from stand Additional ADL Goal #1: Pt will be S in and OOB for basic ADLs  Plan Discharge plan remains appropriate    Co-evaluation                 AM-PAC OT "6 Clicks" Daily Activity     Outcome Measure   Help from another person eating meals?: A Little Help from another person taking care of personal grooming?: A Little Help from another person toileting, which includes using toliet, bedpan, or urinal?: A Little Help from another person bathing (including washing, rinsing, drying)?: A Little Help from another person to put on and taking off regular upper body clothing?: A Little Help from another person to put on and taking off regular lower body clothing?: A Little 6 Click Score: 18    End of Session Equipment Utilized During Treatment: Gait belt;Rolling walker (2 wheels)  OT Visit Diagnosis: Unsteadiness on feet (R26.81);Other abnormalities of gait and mobility (R26.89);Muscle weakness (generalized)  (M62.81)   Activity Tolerance Patient tolerated treatment well   Patient Left in chair;with call bell/phone within reach;with chair alarm set   Nurse Communication Mobility status        Time: 1610-9604 OT Time Calculation (min): 30 min  Charges: OT General Charges $OT Visit: 1 Visit OT Treatments $Self Care/Home Management : 23-37 mins  Lodema Hong, [place]  Office [place] 05/22/2022, 1:29 PM

## 2022-05-23 DIAGNOSIS — E118 Type 2 diabetes mellitus with unspecified complications: Secondary | ICD-10-CM | POA: Diagnosis not present

## 2022-05-23 DIAGNOSIS — I611 Nontraumatic intracerebral hemorrhage in hemisphere, cortical: Secondary | ICD-10-CM | POA: Diagnosis not present

## 2022-05-23 DIAGNOSIS — N170 Acute kidney failure with tubular necrosis: Secondary | ICD-10-CM | POA: Diagnosis not present

## 2022-05-23 DIAGNOSIS — R2990 Unspecified symptoms and signs involving the nervous system: Secondary | ICD-10-CM | POA: Diagnosis not present

## 2022-05-23 DIAGNOSIS — I619 Nontraumatic intracerebral hemorrhage, unspecified: Secondary | ICD-10-CM | POA: Diagnosis not present

## 2022-05-23 DIAGNOSIS — K5792 Diverticulitis of intestine, part unspecified, without perforation or abscess without bleeding: Secondary | ICD-10-CM

## 2022-05-23 LAB — RENAL FUNCTION PANEL
Albumin: 2.3 g/dL — ABNORMAL LOW (ref 3.5–5.0)
Anion gap: 10 (ref 5–15)
BUN: 39 mg/dL — ABNORMAL HIGH (ref 8–23)
CO2: 28 mmol/L (ref 22–32)
Calcium: 7.8 mg/dL — ABNORMAL LOW (ref 8.9–10.3)
Chloride: 98 mmol/L (ref 98–111)
Creatinine, Ser: 2.76 mg/dL — ABNORMAL HIGH (ref 0.44–1.00)
GFR, Estimated: 17 mL/min — ABNORMAL LOW (ref >60)
Glucose, Bld: 106 mg/dL — ABNORMAL HIGH (ref 70–99)
Phosphorus: 3.9 mg/dL (ref 2.5–4.6)
Potassium: 3.5 mmol/L (ref 3.5–5.1)
Sodium: 136 mmol/L (ref 135–145)

## 2022-05-23 MED ORDER — SODIUM CHLORIDE 0.9 % IV SOLN
12.5000 mg | Freq: Four times a day (QID) | INTRAVENOUS | Status: DC
  Administered 2022-05-23 – 2022-05-24 (×5): 12.5 mg via INTRAVENOUS
  Filled 2022-05-23 (×7): qty 0.5

## 2022-05-23 MED ORDER — HYDRALAZINE HCL 20 MG/ML IJ SOLN
10.0000 mg | Freq: Four times a day (QID) | INTRAMUSCULAR | Status: DC
  Administered 2022-05-23 – 2022-05-31 (×33): 10 mg via INTRAVENOUS
  Filled 2022-05-23 (×33): qty 1

## 2022-05-23 MED ORDER — RINGERS IV SOLN: INTRAVENOUS | Status: AC

## 2022-05-23 NOTE — Progress Notes (Signed)
Loop recorder on discharge Will see the patient Tuesday, call for questions

## 2022-05-23 NOTE — Progress Notes (Signed)
Brief note:  Cr really improving today IVFs expired N/v/d improved as of yesterday Dont' see a compelling reason to go back on IVFs Seems like we're out of the woods for RRT Will follow  Madelon Lips MD [employer] Pgr [phone]

## 2022-05-23 NOTE — Progress Notes (Addendum)
Pt has scheduled hydralazine PO for 0600. Pt refused PO meds due to nausea and difficulty swallowing. Zofran IV given, but pt continues to refuse PO med. Labetalol IV 10mg  given. Pt does not want her meds to be crushed. Rai, MD notified.

## 2022-05-23 NOTE — Progress Notes (Signed)
[facility] progress note                                                                              Lauren Craig, is a 76 y.o. female, DOB - 1945-12-14, PVX:[id number] Admit date - 05/15/2022    Outpatient Primary MD for the patient is Martyn Malay, MD  LOS - 8  days  Chief Complaint  Patient presents with   Code Stroke       Brief summary   [name] is a 76 y.o. female with history of multiple prior strokes, hypertension, arthritis, hyperlipidemia, osteomyelitis, PVD, diabetes, esophagitis and anxiety presenting after being found down at home with dysarthria and left-sided facial droop. She has a hemorrhage that is likely a transformation from an embolic stroke. She was supposed to be discharged yesterday but her creatinine is getting worse. Cr was around 2.1 when she was admitted.  Creatinine worsened to 4.4 and TR[facility] was consulted.   Assessment & Plan    Principal Problem:   ICH (intracerebral hemorrhage) ([place]) -Prior history of CVA, presented with strokelike symptoms on 12/17 and was found to have [place].  Stabilized from neurological standpoint. -SLP evaluation recommended dysphagia 3 diet, not eating too well due to nausea -PT evaluation on 5/21 recommended home health PT -Plan for loop recorder, cardiology following -Patient was on Plavix 75 mg daily PTA, now on aspirin 81 mg daily due to [facility].  Neurology following.  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  -Nephrology consulted -Creatinine improving, 2.76 today -will continue IV fluids as patient is not eating well and has acute diverticulitis.    Nausea vomiting secondary to acute diverticulitis - prior history of C. difficile colitis - Per daughter, this is chronic issue for a  year, has had extensive GI work-up, GE emptying study was normal, she had the EGD in 10/2020 which showed severe reflux esophagitis, duodenitis.  Biopsy showed chronic gastritis, negative for H. pylori, no intestinal metaplasia or dysplasia or malignancy. Patient was treated last year for C. difficile colitis  -CT A/P abdomen showed left-sided diverticulosis with diverticulitis, ?  Pyelonephritis.  UA negative for UTI -Continue IV Rocephin, Flagyl -Added scheduled Phenergan, continue IV Zofran as needed      Controlled diabetes mellitus type 2 with complications (HCC) -Recent hemoglobin A1c 6.4, -CBGs controlled, continue sensitive SSI    HTN (hypertension) Difficult to Control  -Patient refuses p.o. medications, BP readings elevated  -Placed on hydralazine IV 10 mg every 6 hours -On p.o. hydralazine 100 mg 3 times daily, Norvasc 10 mg daily, Coreg 6.25 mg BID (has been refusing)     Hyperlipidemia -Continue Crestor, Zetia    PVD (peripheral vascular disease) ([place]) -Status post left fifth toe amputation   Estimated body mass index is 28.76 kg/m as calculated from the following:   Height as of this encounter: 5\' 4"  (1.626 m).   Weight as of this encounter: 76 kg.  Code Status: DNR DVT Prophylaxis:  SCD's Start: 05/15/22 1253   Level of Care: Level of care: Telemetry Medical Family Communication: Updated patient's daughter Lenox Ladouceur on phone on 12/24 disposition Plan:      Remains inpatient appropriate: Creatinine improving  Procedures:  None  Consultants:   Nephrology Admitted by stroke service  Antimicrobials: None    Medications  amLODipine  10 mg Oral Daily   aspirin EC  81 mg Oral Daily   carvedilol  6.25 mg Oral Q breakfast   ezetimibe  10 mg Oral Daily   hydrALAZINE  10 mg Intravenous Q6H   metroNIDAZOLE  500 mg Oral Q12H   pantoprazole (PROTONIX) IV  40 mg Intravenous Q12H   rosuvastatin  40 mg Oral Daily   sertraline  25 mg Oral Daily   sodium  bicarbonate  650 mg Oral BID   sucralfate  1 g Oral TID WC & HS      Subjective:   Lauren Craig was seen and examined today.  BP elevated, refuses p.o. meds, states she is feeling dehydrated. + Nausea, no fevers or chills.  Currently no abdominal pain.  Objective:   Vitals:   05/23/22 0319 05/23/22 0330 05/23/22 0632 05/23/22 0714  BP: (!) 163/52 (!) 158/56 (!) 173/54 (!) 177/53  Pulse: 75   78  Resp: 16   18  Temp: 98 F (36.7 C)   98.6 F (37 C)  TempSrc: Oral   Oral  SpO2: 91%   (!) 88%  Weight:      Height:        Intake/Output Summary (Last 24 hours) at 05/23/2022 0834 Last data filed at 05/23/2022 0321 Gross per 24 hour  Intake 2086.85 ml  Output 1800 ml  Net 286.85 ml     Wt Readings from Last 3 Encounters:  05/18/22 76 kg  04/05/22 72.3 kg  02/24/22 71.8 kg    Physical Exam General: Alert and oriented x 3, NAD Cardiovascular: S1 S2 clear, RRR.  Respiratory: CTAB Gastrointestinal: Soft, nontender, nondistended, NBS Ext: no pedal edema bilaterally Neuro: left-sided weakness Psych: anxious    Data Reviewed:  I have personally reviewed following labs    CBC Lab Results  Component Value Date   WBC 7.5 05/20/2022   RBC 3.50 (L) 05/20/2022   HGB 9.4 (L) 05/20/2022   HCT 28.4 (L) 05/20/2022   MCV 81.1 05/20/2022   MCH 26.9 05/20/2022   PLT 223 05/20/2022   MCHC 33.1 05/20/2022   RDW 15.9 (H) 05/20/2022   LYMPHSABS 1.0 05/15/2022   MONOABS 0.4 05/15/2022   EOSABS 0.0 05/15/2022   BASOSABS 0.0 02/77/4128     Last metabolic panel Lab Results  Component Value Date   NA 136 05/23/2022   K 3.5 05/23/2022   CL 98 05/23/2022   CO2 28 05/23/2022   BUN 39 (H) 05/23/2022   CREATININE 2.76 (H) 05/23/2022   GLUCOSE 106 (H) 05/23/2022   GFRNONAA 17 (L) 05/23/2022   GFRAA >60 11/11/2019   CALCIUM 7.8 (L) 05/23/2022   PHOS 3.9 05/23/2022   PROT 6.9 05/15/2022   ALBUMIN 2.3 (L) 05/23/2022   LABGLOB 2.2 06/07/2021   AGRATIO 1.9 06/07/2021    BILITOT 0.8 05/15/2022   ALKPHOS 79 05/15/2022   AST 22 05/15/2022   ALT 14 05/15/2022   ANIONGAP 10 05/23/2022    CBG (last 3)  No results for input(s): "GLUCAP" in the last 72 hours.     Coagulation Profile: No results for input(s): "INR", "PROTIME" in the last 168 hours.  Radiology Studies: I have personally reviewed the imaging studies  CT ABDOMEN PELVIS WO CONTRAST  Result Date: 05/22/2022 CLINICAL DATA:  Abdominal pain with nausea vomiting and diarrhea. 12/17/2020 EXAM: CT ABDOMEN AND PELVIS WITHOUT CONTRAST TECHNIQUE: Multidetector CT imaging of the abdomen and pelvis was performed following the standard protocol without IV contrast. RADIATION DOSE REDUCTION: This exam was performed according to the departmental dose-optimization program which includes automated exposure control, adjustment of the mA and/or kV according to patient size and/or use of iterative reconstruction technique. COMPARISON:  None Available. FINDINGS: Lower chest: Bibasilar dependent atelectasis with small bilateral pleural effusions. Hepatobiliary: No suspicious focal abnormality in the liver on this study without intravenous contrast. Gallbladder surgically absent. No intrahepatic or extrahepatic biliary dilation. Pancreas: No focal mass lesion. No dilatation of the main duct. No intraparenchymal cyst. No peripancreatic edema. Spleen: No splenomegaly. No focal mass lesion. Adrenals/Urinary Tract: No adrenal nodule or mass. 17 mm water density lesion lower pole right kidney is compatible with a cyst, stable since 10/28/2020. No followup imaging is recommended. Both kidney show heterogeneous parenchymal attenuation, nonspecific but pyelonephritis could have this appearance. No evidence for hydroureter. Bladder is moderately distended. Stomach/Bowel: Stomach is unremarkable. No gastric wall thickening. No evidence of outlet obstruction. Duodenum is normally positioned as is the ligament of Treitz. No small bowel  wall thickening. No small bowel dilatation. The terminal ileum is normal. The appendix is normal. No gross colonic mass. No colonic wall thickening. Left-sided diverticulosis evident. Subtle pericolonic stranding along the descending colon (51/3) raises the question of diverticulitis. Vascular/Lymphatic: There is advanced atherosclerotic calcification of the abdominal aorta without aneurysm. There is no gastrohepatic or hepatoduodenal ligament lymphadenopathy. No retroperitoneal or mesenteric lymphadenopathy. No pelvic sidewall lymphadenopathy. Reproductive: Hysterectomy.  There is no adnexal mass. Other: No intraperitoneal free fluid. Musculoskeletal: Bones are diffusely demineralized. Degenerative changes noted in both hips. IMPRESSION: 1. Left-sided diverticulosis with subtle pericolonic stranding along the descending colon. Imaging features raise the question of diverticulitis. 2. Both kidneys show heterogeneous parenchymal attenuation, nonspecific but pyelonephritis could have this appearance. 3. Bibasilar dependent atelectasis with small bilateral pleural effusions. 4.  Aortic Atherosclerosis (ICD10-I70.0). Electronically Signed   By: Misty Stanley M.D.   On: 05/22/2022 12:22       Alaysiah Browder M.D. [facility] 05/23/2022, 8:34 AM  Available via Epic secure chat 7am-7pm After 7 pm, please refer to night coverage provider listed on amion.
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
## 2022-05-23 NOTE — Plan of Care (Signed)
  Problem: Nutrition: Goal: Dietary intake will improve Outcome: Not Progressing   Problem: Fluid Volume: Goal: Ability to maintain a balanced intake and output will improve Outcome: Not Progressing   Problem: Pain Managment: Goal: General experience of comfort will improve Outcome: Not Progressing

## 2022-05-24 DIAGNOSIS — R112 Nausea with vomiting, unspecified: Secondary | ICD-10-CM | POA: Diagnosis not present

## 2022-05-24 DIAGNOSIS — E118 Type 2 diabetes mellitus with unspecified complications: Secondary | ICD-10-CM | POA: Diagnosis not present

## 2022-05-24 DIAGNOSIS — R2990 Unspecified symptoms and signs involving the nervous system: Secondary | ICD-10-CM | POA: Diagnosis not present

## 2022-05-24 DIAGNOSIS — I63512 Cerebral infarction due to unspecified occlusion or stenosis of left middle cerebral artery: Secondary | ICD-10-CM | POA: Diagnosis not present

## 2022-05-24 DIAGNOSIS — N170 Acute kidney failure with tubular necrosis: Secondary | ICD-10-CM | POA: Diagnosis not present

## 2022-05-24 DIAGNOSIS — I619 Nontraumatic intracerebral hemorrhage, unspecified: Secondary | ICD-10-CM | POA: Diagnosis not present

## 2022-05-24 LAB — RENAL FUNCTION PANEL
Albumin: 2.2 g/dL — ABNORMAL LOW (ref 3.5–5.0)
Anion gap: 14 (ref 5–15)
BUN: 35 mg/dL — ABNORMAL HIGH (ref 8–23)
CO2: 25 mmol/L (ref 22–32)
Calcium: 8.2 mg/dL — ABNORMAL LOW (ref 8.9–10.3)
Chloride: 101 mmol/L (ref 98–111)
Creatinine, Ser: 2.56 mg/dL — ABNORMAL HIGH (ref 0.44–1.00)
GFR, Estimated: 19 mL/min — ABNORMAL LOW (ref >60)
Glucose, Bld: 100 mg/dL — ABNORMAL HIGH (ref 70–99)
Phosphorus: 4.6 mg/dL (ref 2.5–4.6)
Potassium: 3.6 mmol/L (ref 3.5–5.1)
Sodium: 140 mmol/L (ref 135–145)

## 2022-05-24 MED ORDER — ONDANSETRON HCL 4 MG/2ML IJ SOLN
4.0000 mg | Freq: Four times a day (QID) | INTRAMUSCULAR | Status: AC
  Administered 2022-05-24 – 2022-05-25 (×6): 4 mg via INTRAVENOUS
  Filled 2022-05-24 (×6): qty 2

## 2022-05-24 MED ORDER — METOCLOPRAMIDE HCL 5 MG/ML IJ SOLN
10.0000 mg | Freq: Four times a day (QID) | INTRAMUSCULAR | Status: DC
  Administered 2022-05-24 – 2022-05-25 (×5): 10 mg via INTRAVENOUS
  Filled 2022-05-24 (×5): qty 2

## 2022-05-24 MED ORDER — METRONIDAZOLE 500 MG/100ML IV SOLN
500.0000 mg | Freq: Two times a day (BID) | INTRAVENOUS | Status: DC
  Administered 2022-05-24 (×2): 500 mg via INTRAVENOUS
  Filled 2022-05-24 (×2): qty 100

## 2022-05-24 MED ORDER — PROCHLORPERAZINE EDISYLATE 10 MG/2ML IJ SOLN
10.0000 mg | INTRAMUSCULAR | Status: DC | PRN
  Administered 2022-05-26 – 2022-05-30 (×4): 10 mg via INTRAVENOUS
  Filled 2022-05-24 (×5): qty 2
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
## 2022-05-24 NOTE — Plan of Care (Signed)
  Problem: Nutrition: Goal: Dietary intake will improve Outcome: Not Progressing   Problem: Fluid Volume: Goal: Ability to maintain a balanced intake and output will improve Outcome: Not Progressing   Problem: Health Behavior/Discharge Planning: Goal: Ability to manage health-related needs will improve Outcome: Not Progressing

## 2022-05-24 NOTE — Progress Notes (Addendum)
[facility] progress note                                                                              Lauren Craig, is a 76 y.o. female, DOB - 09-18-1945, HCW:[id number] Admit date - 05/15/2022    Outpatient Primary MD for the patient is Martyn Malay, MD  LOS - 9  days  Chief Complaint  Patient presents with   Code Stroke       Brief summary   Lauren Craig is a 76 y.o. female with history of multiple prior strokes, hypertension, arthritis, hyperlipidemia, osteomyelitis, PVD, diabetes, esophagitis and anxiety presenting after being found down at home with dysarthria and left-sided facial droop. She has a hemorrhage that is likely a transformation from an embolic stroke. She was supposed to be discharged yesterday but her creatinine is getting worse. Cr was around 2.1 when she was admitted.  Creatinine worsened to 4.4 and [facility] was consulted.   Assessment & Plan    Principal Problem:   ICH (intracerebral hemorrhage) ([place]) -Prior history of CVA, presented with strokelike symptoms on 12/17 and was found to have [place].  Stabilized from neurological standpoint. -Plan for loop recorder, cardiology following -Patient was on Plavix 75 mg daily PTA, now on aspirin 81 mg daily due to [facility].  Neurology following. 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  -Nephrology consulted -Creatinine improving, 2.5 -Persistent nausea, not taking her meds or eating too well.  Continue gentle hydration    Persistent nausea vomiting, mild acute diverticulitis - prior history of prior C. difficile colitis - Per daughter, nausea is chronic issue for a year, has had extensive  GI work-up, GE emptying study was normal, she had the EGD in 10/2020 which showed severe reflux esophagitis, duodenitis.  Biopsy showed chronic gastritis, negative for H. pylori, no intestinal metaplasia or dysplasia or malignancy. Patient was treated last year for C. difficile colitis  -CT A/P abdomen showed left-sided diverticulosis with diverticulitis, ?  Pyelonephritis.  UA negative for UTI -Continue IV Rocephin, Flagyl -Zofran and scheduled Phenergan is not helping, will try IV Reglan and Compazine -Continue IV Protonix, Carafate, requested GI consult    Controlled diabetes mellitus type 2 with complications (HCC) -Recent hemoglobin A1c 6.4, -CBG stable, continue sensitive SSI    HTN (hypertension) Difficult to Control  -BP readings have been elevated, patient has been refusing p.o. meds due to nausea -Continue IV hydralazine 10 mg every 6 hours     Hyperlipidemia -Continue Crestor, Zetia    PVD (peripheral vascular disease) ([place]) -Status post left fifth toe amputation  Code Status: DNR DVT Prophylaxis:  SCD's Start: 05/15/22 1253   Level of Care: Level of care: Telemetry Medical Family Communication: Updated patient's daughter Seynabou Fults on phone today on 12/26  Disposition Plan:  Remains inpatient appropriate: Home health when ready, daughter has moved in and will provide 24/7  supervision  Procedures:    Consultants:   Nephrology Admitted by stroke service  Antimicrobials: None    Medications  amLODipine  10 mg Oral Daily   aspirin EC  81 mg Oral Daily   carvedilol  6.25 mg Oral Q breakfast   ezetimibe  10 mg Oral Daily   hydrALAZINE  10 mg Intravenous Q6H   pantoprazole (PROTONIX) IV  40 mg Intravenous Q12H   rosuvastatin  40 mg Oral Daily   sertraline  25 mg Oral Daily   sucralfate  1 g Oral TID WC & HS      Subjective:   Lauren Craig was seen and examined today.  BP readings elevated due to refusal of p.o. meds, feeling persistently nauseous, no  fevers or chills, currently no abdominal pain.  Feels miserable.    Objective:   Vitals:   05/24/22 0130 05/24/22 0324 05/24/22 0710 05/24/22 1008  BP: (!) 161/58 (!) 160/56 (!) 174/86 (!) 166/63  Pulse:  76 82 85  Resp:  16 16   Temp:  98.6 F (37 C) 98.4 F (36.9 C)   TempSrc:  Oral Oral   SpO2:  96% 97%   Weight:      Height:        Intake/Output Summary (Last 24 hours) at 05/24/2022 1039 Last data filed at 05/24/2022 0431 Gross per 24 hour  Intake 1835 ml  Output 500 ml  Net 1335 ml     Wt Readings from Last 3 Encounters:  05/18/22 76 kg  04/05/22 72.3 kg  02/24/22 71.8 kg   Physical Exam General: Alert and oriented x 3, feels anxious, uncomfortable with nausea Cardiovascular: S1 S2 clear, RRR.  Respiratory: CTAB, no wheezing, Gastrointestinal: Soft, nontender, nondistended, NBS Ext: no pedal edema bilaterally Neuro: mild L LE weakness 4/5, 5/5 in the bilateral upper extremities and RLE Psych: Normal affect     Data Reviewed:  I have personally reviewed following labs    CBC Lab Results  Component Value Date   WBC 7.5 05/20/2022   RBC 3.50 (L) 05/20/2022   HGB 9.4 (L) 05/20/2022   HCT 28.4 (L) 05/20/2022   MCV 81.1 05/20/2022   MCH 26.9 05/20/2022   PLT 223 05/20/2022   MCHC 33.1 05/20/2022   RDW 15.9 (H) 05/20/2022   LYMPHSABS 1.0 05/15/2022   MONOABS 0.4 05/15/2022   EOSABS 0.0 05/15/2022   BASOSABS 0.0 82/99/3716     Last metabolic panel Lab Results  Component Value Date   NA 140 05/24/2022   K 3.6 05/24/2022   CL 101 05/24/2022   CO2 25 05/24/2022   BUN 35 (H) 05/24/2022   CREATININE 2.56 (H) 05/24/2022   GLUCOSE 100 (H) 05/24/2022   GFRNONAA 19 (L) 05/24/2022   GFRAA >60 11/11/2019   CALCIUM 8.2 (L) 05/24/2022   PHOS 4.6 05/24/2022   PROT 6.9 05/15/2022   ALBUMIN 2.2 (L) 05/24/2022   LABGLOB 2.2 06/07/2021   AGRATIO 1.9 06/07/2021   BILITOT 0.8 05/15/2022   ALKPHOS 79 05/15/2022   AST 22 05/15/2022   ALT 14 05/15/2022    ANIONGAP 14 05/24/2022    CBG (last 3)  No results for input(s): "GLUCAP" in the last 72 hours.     Coagulation Profile: No results for input(s): "INR", "PROTIME" in the last 168 hours.    Radiology Studies: I have personally reviewed the imaging studies  CT ABDOMEN PELVIS WO CONTRAST  Result Date: 05/22/2022 CLINICAL DATA:  Abdominal pain with nausea vomiting and diarrhea. 12/17/2020  EXAM: CT ABDOMEN AND PELVIS WITHOUT CONTRAST TECHNIQUE: Multidetector CT imaging of the abdomen and pelvis was performed following the standard protocol without IV contrast. RADIATION DOSE REDUCTION: This exam was performed according to the departmental dose-optimization program which includes automated exposure control, adjustment of the mA and/or kV according to patient size and/or use of iterative reconstruction technique. COMPARISON:  None Available. FINDINGS: Lower chest: Bibasilar dependent atelectasis with small bilateral pleural effusions. Hepatobiliary: No suspicious focal abnormality in the liver on this study without intravenous contrast. Gallbladder surgically absent. No intrahepatic or extrahepatic biliary dilation. Pancreas: No focal mass lesion. No dilatation of the main duct. No intraparenchymal cyst. No peripancreatic edema. Spleen: No splenomegaly. No focal mass lesion. Adrenals/Urinary Tract: No adrenal nodule or mass. 17 mm water density lesion lower pole right kidney is compatible with a cyst, stable since 10/28/2020. No followup imaging is recommended. Both kidney show heterogeneous parenchymal attenuation, nonspecific but pyelonephritis could have this appearance. No evidence for hydroureter. Bladder is moderately distended. Stomach/Bowel: Stomach is unremarkable. No gastric wall thickening. No evidence of outlet obstruction. Duodenum is normally positioned as is the ligament of Treitz. No small bowel wall thickening. No small bowel dilatation. The terminal ileum is normal. The appendix is  normal. No gross colonic mass. No colonic wall thickening. Left-sided diverticulosis evident. Subtle pericolonic stranding along the descending colon (51/3) raises the question of diverticulitis. Vascular/Lymphatic: There is advanced atherosclerotic calcification of the abdominal aorta without aneurysm. There is no gastrohepatic or hepatoduodenal ligament lymphadenopathy. No retroperitoneal or mesenteric lymphadenopathy. No pelvic sidewall lymphadenopathy. Reproductive: Hysterectomy.  There is no adnexal mass. Other: No intraperitoneal free fluid. Musculoskeletal: Bones are diffusely demineralized. Degenerative changes noted in both hips. IMPRESSION: 1. Left-sided diverticulosis with subtle pericolonic stranding along the descending colon. Imaging features raise the question of diverticulitis. 2. Both kidneys show heterogeneous parenchymal attenuation, nonspecific but pyelonephritis could have this appearance. 3. Bibasilar dependent atelectasis with small bilateral pleural effusions. 4.  Aortic Atherosclerosis (ICD10-I70.0). Electronically Signed   By: Misty Stanley M.D.   On: 05/22/2022 12:22       Tekeshia Klahr M.D. [facility] 05/24/2022, 10:39 AM  Available via Epic secure chat 7am-7pm After 7 pm, please refer to night coverage provider listed on amion.

## 2022-05-24 NOTE — Progress Notes (Signed)
Speech Language Pathology Treatment:    Patient Details Name: Lauren Craig MRN: [id number] DOB: 11-23-1945 Today's Date: 05/24/2022 Time: 1045-1100 SLP Time Calculation (min) (ACUTE ONLY): 15 min  Assessment / Plan / Recommendation Clinical Impression  Pt seen at bedside for limited reassessment. Pt was seated in recliner, awake and eating ice chips.  SLP explained purpose of visit to reassess swallow function and safety, and offered multiple items from which to choose. Pt declined all food and liquids offered. She did appear to tolerate ice chips without difficulty. Pt continues to utilize strategies for increasing intelligibility (slow rate, overarticulation). No unintelligible speech noted this session.   Pt requested SLP put cold water in her pitcher so she could put her hand in. Pt put each hand in the pitcher individually. Figuring pt might be hot, pt was asked if she was hot, and offered to get a fan for her. Pt declined. RN informed.   HPI HPI: Pt is a 76 yo female presenting with slurred speech and weakness after unwitnessed fall. CT shows a trace SAH at the R vertex. MRI shows stable SAH at the R vertex with mild DWI infarct there as well as strong evidence of superimposed scattered small acute R MCA territory infarcts, including corona radiata and anterior insula. PMH includes: multiple prior strokes (11/2020 R ACA infarcts; evaluated by SLP and found to have moderate memory deficits and unilateral UMN dysarthria, MBS with silent aspiration x1 with thin liquids only while swallowing pill), esophagitis, anxiety, HTN, arthritis, HLD, osteomyelitis, PVD, DM type 2      SLP Plan  Continue with current plan of care      Recommendations for follow up therapy are one component of a multi-disciplinary discharge planning process, led by the attending physician.  Recommendations may be updated based on patient status, additional functional criteria and insurance authorization.     Recommendations  Diet recommendations: Dysphagia 3 (mechanical soft);Thin liquid Liquids provided via: Cup;Straw Medication Administration: Whole meds with puree Supervision: Patient able to self feed;Intermittent supervision to cue for compensatory strategies Compensations: Slow rate;Small sips/bites Postural Changes and/or Swallow Maneuvers: Seated upright 90 degrees                Oral Care Recommendations: Oral care BID Follow Up Recommendations: Home health SLP Assistance recommended at discharge: Intermittent Supervision/Assistance SLP Visit Diagnosis: Dysphagia, oropharyngeal phase (R13.12) Plan: Continue with current plan of care          Malissia Rabbani B. Quentin Ore, [facility], [place] Speech Language Pathologist Office: [phone]  Shonna Chock 05/24/2022, 11:04 AM

## 2022-05-24 NOTE — Progress Notes (Signed)
[facility] Progress Note   Assessment/ Plan:   76 year old WF-  vasculopath-  presents with new CVA/SAH and has developed Aon CRF in house  1.Renal- baseline crt 1.5 to 1.8 with history of mild proteinuria-  likely due to HTN.  Now with A on CRF in the setting of CVA and SAH-  events noted acute drop in BP and contrasted CT on 12/17. Suspected ATN plus contrast mediated AKI.  Cr peaked at 4.6 on 12/23 and has subsequently trended down to 2.6 today. UOP has been normal.  She remains on IVF due to poor po intake, would continue for now.  2. Hypertension/volume  -  on amlodipine/coreg and hydralazine- BP remains high but she's refusing po meds -says unable to take them.  On IV hyralazine currently.   +4.7L for admit but doesn't appear grossly overloaded.  Hold on diuretics  3. Metabolic acidosis-  mild, hold po bicarb and follow 4. Anemia  - Hb in the 9s, slight trend down, per primary. 5.  Neuro- s/p CVA and also SAH-  seems to be doing well except the oral intake issue which needs to be better for d/c 6.  N/v/d: CT scan today 12/24 shows possible diverticulitis and pyelonephritis- abx have been ordered per primary   Looks like if she can take pills/maintain po intake could d/c -- Kidney function not quite to baseline but is improving and that wouldn't be a barrier to d/c.  Unfortunately she needs to show she can maintain PO Intake off IVF to prevent hypovolemia and she's not at that point yet.   Subjective:    Says cannot eat/drink.  ST eval noted today - has dysphagia 3 diet with thin liquids.  Pt unable to explain issues with eating further to me.  Asking to go home -- discussed hospice would only way to d/c today - she says no, cont medical therapies.    Objective:   BP (!) 166/63   Pulse 85   Temp 98.4 F (36.9 C) (Oral)   Resp 16   Ht 5\' 4"  (1.626 m)   Wt 76 kg   SpO2 97%   BMI 28.76 kg/m   Intake/Output Summary (Last 24 hours) at 05/24/2022 1012 Last data filed  at 05/24/2022 0431 Gross per 24 hour  Intake 1835 ml  Output 500 ml  Net 1335 ml    Weight change:   Physical Exam: Gen:NAD, sitting in chair CVS: rrr Resp: clear Abd: soft Ext: no LE edema NEURO: ++ Dysarthria.    Imaging: CT ABDOMEN PELVIS WO CONTRAST  Result Date: 05/22/2022 CLINICAL DATA:  Abdominal pain with nausea vomiting and diarrhea. 12/17/2020 EXAM: CT ABDOMEN AND PELVIS WITHOUT CONTRAST TECHNIQUE: Multidetector CT imaging of the abdomen and pelvis was performed following the standard protocol without IV contrast. RADIATION DOSE REDUCTION: This exam was performed according to the departmental dose-optimization program which includes automated exposure control, adjustment of the mA and/or kV according to patient size and/or use of iterative reconstruction technique. COMPARISON:  None Available. FINDINGS: Lower chest: Bibasilar dependent atelectasis with small bilateral pleural effusions. Hepatobiliary: No suspicious focal abnormality in the liver on this study without intravenous contrast. Gallbladder surgically absent. No intrahepatic or extrahepatic biliary dilation. Pancreas: No focal mass lesion. No dilatation of the main duct. No intraparenchymal cyst. No peripancreatic edema. Spleen: No splenomegaly. No focal mass lesion. Adrenals/Urinary Tract: No adrenal nodule or mass. 17 mm water density lesion lower pole right kidney is compatible with a cyst, stable  since 10/28/2020. No followup imaging is recommended. Both kidney show heterogeneous parenchymal attenuation, nonspecific but pyelonephritis could have this appearance. No evidence for hydroureter. Bladder is moderately distended. Stomach/Bowel: Stomach is unremarkable. No gastric wall thickening. No evidence of outlet obstruction. Duodenum is normally positioned as is the ligament of Treitz. No small bowel wall thickening. No small bowel dilatation. The terminal ileum is normal. The appendix is normal. No gross colonic mass. No  colonic wall thickening. Left-sided diverticulosis evident. Subtle pericolonic stranding along the descending colon (51/3) raises the question of diverticulitis. Vascular/Lymphatic: There is advanced atherosclerotic calcification of the abdominal aorta without aneurysm. There is no gastrohepatic or hepatoduodenal ligament lymphadenopathy. No retroperitoneal or mesenteric lymphadenopathy. No pelvic sidewall lymphadenopathy. Reproductive: Hysterectomy.  There is no adnexal mass. Other: No intraperitoneal free fluid. Musculoskeletal: Bones are diffusely demineralized. Degenerative changes noted in both hips. IMPRESSION: 1. Left-sided diverticulosis with subtle pericolonic stranding along the descending colon. Imaging features raise the question of diverticulitis. 2. Both kidneys show heterogeneous parenchymal attenuation, nonspecific but pyelonephritis could have this appearance. 3. Bibasilar dependent atelectasis with small bilateral pleural effusions. 4.  Aortic Atherosclerosis (ICD10-I70.0). Electronically Signed   By: Misty Stanley M.D.   On: 05/22/2022 12:22    Labs: BMET Recent Labs  Lab 05/19/22 0443 05/19/22 1619 05/20/22 0303 05/21/22 0235 05/22/22 0228 05/23/22 0251 05/24/22 0413  NA 138 135 138 138 137 136 140  K 4.7 4.8 4.5 4.6 3.9 3.5 3.6  CL 113* 112* 114* 115* 109 98 101  CO2 15* 12* 16* 15* 22 28 25   GLUCOSE 111* 123* 93 114* 102* 106* 100*  BUN 55* 58* 60* 59* 51* 39* 35*  CREATININE 4.27* 4.41* 4.41* 4.64* 3.65* 2.76* 2.56*  CALCIUM 8.3* 8.4* 8.0* 8.2* 7.9* 7.8* 8.2*  PHOS  --  5.9* 5.6* 5.5* 4.6 3.9 4.6    CBC Recent Labs  Lab 05/20/22 0306  WBC 7.5  HGB 9.4*  HCT 28.4*  MCV 81.1  PLT 223     Medications:     amLODipine  10 mg Oral Daily   aspirin EC  81 mg Oral Daily   carvedilol  6.25 mg Oral Q breakfast   ezetimibe  10 mg Oral Daily   hydrALAZINE  10 mg Intravenous Q6H   pantoprazole (PROTONIX) IV  40 mg Intravenous Q12H   rosuvastatin  40 mg Oral Daily    sertraline  25 mg Oral Daily   sodium bicarbonate  650 mg Oral BID   sucralfate  1 g Oral TID WC & HS   Jannifer Hick MD [facility] Pager [phone]

## 2022-05-24 NOTE — Consult Note (Signed)
[facility] Consult: 10:23 AM 05/24/2022  LOS: 9 days    Referring Provider: Dr Tana Coast  Primary Care Physician:  Martyn Malay, MD Primary Gastroenterologist:  Dr. Scarlette Shorts    Reason for Consultation: Nausea, vomiting.   HPI: Lauren Craig is a 76 y.o. female.  PMH "vasculopath".  HTN.  DM 2.  Previously insulin requiring but no diabetes meds at the time of current admission.  HLD.  Previous Plavix (taking as of 05/2021) but appears not to be taking this at the time of current admission (Pt confirms but unclear for how long not plavix).  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  Previous hysterectomy.   10/2020 EGD.  For evaluation greater than 1 year nausea, vomiting, epigastric pain, abnormal CT (? Tip appendicitis. Fat stranding at duodenal bulb. Thickened distal esophagus). This showed changes of severe reflux esophagitis, severe duodenitis.  Pathology showed chronic gastritis, no H. pylori, metaplasia, dysplasia, malignancy.  Treated with pantoprazole 40 mg bid, Carafate, Prn Zofran, prn Phenergan  In 10/2020 had diarrhea and stool studies indeterminate for C. difficile infection but diarrhea resolved after a course of oral vancomycin. Toxigenic c diff positive on 08/27/21.  Treated again w 10 d po Vanco (no coverage for Dificid).   03/11/2022 gastric emptying study normal.  After this was advised to increase Protonix to bid.  Last GI OV 04/05/2022.  Nausea and vomiting better after restarting PPI twice daily.  Was using Zofran as needed.  On past GI office visits patient noncommittal as far as screening colonoscopy (no prior colonoscopies).     Patient currently week 2 of admission following right MCA infarcts, likely embolic, SAH .  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  Cardiology suggests outpt loop recorder for A-fib rule out.  Initiated on Plavix.   Other issues are AKI (renal suspects ATN plus contrast mediated AKI).  Metabolic acidosis. Pt now states nausea, vomiting, diarrhea are ongoing for > 1 year.  Still takes Pepcid and Protonix in the morning.  As of PTA med list these were both once daily.  Prn Zofran helps.  However she says she still has vomiting of clear material or partially digested food up to 3 times a week.  Diarrhea interspersed with formed stools.  No bloody stools.  Discomfort/mild to moderate pain in epigastric area is intermittent. RN today was also her nurse yesterday and she said the patient had no diarrhea and although she is nauseated, she has not vomited. Current weight: 76 Kg, was 72.3 Kg at OV 2 m ago.    CTAP 12/24 wo contrast: Left sided diverticulosis.  Subtle pericolonic stranding at descending colon raising question of diverticulitis.  Bilateral renal parenchymal heterogeneity nonspecific, cannot rule out pyelonephritis.  Lungs show dependent atelectasis and small pleural effusions.  Aortic atherosclerosis present without aneurysm.    Retired.  Previous employment as [employer] and then a [employer] in [place]. No alcohol use or tobacco use. Previously lived alone in [place].  Tells me that when she leaves the hospital her daughter will  move in with her.  Daughter currently lives in [place].  Past Medical History:  Diagnosis Date   Anxiety disorder    Arthritis    hands, hips   C. 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   Vitamin B12 deficiency     Past Surgical History:  Procedure Laterality Date   ABDOMINAL AORTOGRAM W/LOWER EXTREMITY N/A 08/11/2017   Procedure:  ABDOMINAL AORTOGRAM W/LOWER EXTREMITY;  Surgeon: Angelia Mould, MD;  Location: [facility];  Service: Cardiovascular;  Laterality: N/A;   ABDOMINAL HYSTERECTOMY     AMPUTATION Left 11/14/2019   Procedure: Left Fifth ray amputation;  Surgeon: Wylene Simmer, MD;  Location: [facility];  Service: Orthopedics;  Laterality: Left;   AMPUTATION TOE Left 05/17/2018   Procedure: 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;  Surgeon: Wylene Simmer, MD;  Location: [facility];  Service: Orthopedics;  Laterality: Left;   BIOPSY  10/29/2020   Procedure: BIOPSY;  Surgeon: Irene Shipper, MD;  Location: [facility];  Service: Endoscopy;;   ESOPHAGOGASTRODUODENOSCOPY (EGD) WITH PROPOFOL N/A 10/29/2020   Procedure: ESOPHAGOGASTRODUODENOSCOPY (EGD) WITH PROPOFOL;  Surgeon: Irene Shipper, MD;  Location: [facility] ENDOSCOPY;  Service: Endoscopy;  Laterality: N/A;   PARTIAL HYMENECTOMY  1968   TONSILLECTOMY AND ADENOIDECTOMY     VAGINAL HYSTERECTOMY  1987    Prior to Admission medications   Medication Sig Start Date End Date Taking? Authorizing Provider  acetaminophen (TYLENOL) 325 MG tablet Take 2 tablets (650 mg total) by mouth every 6 (six) hours as needed for headache. 11/01/20  Yes Alcus Dad, MD  albuterol (VENTOLIN HFA) 108 (90 Base) MCG/ACT inhaler Inhale 2 puffs into the lungs every 6 (six) hours as needed for wheezing or shortness of breath. 01/29/21  Yes Martyn Malay, MD  amLODipine (NORVASC) 10 MG tablet Take 1 tablet (10 mg total) by mouth daily. 07/05/21  Yes Martyn Malay, MD  carvedilol (COREG) 6.25 MG tablet Take 1 tablet by mouth 2 (two) times daily with a meal.   Yes [provider]  enalapril (VASOTEC) 20 MG tablet Take 1 tablet (20 mg total) by mouth at bedtime. 11/05/21  Yes Martyn Malay, MD  famotidine (PEPCID) 10 MG tablet Take 1 tablet (10 mg total) by mouth daily. 12/17/21  Yes Martyn Malay, MD  fluticasone (FLONASE) 50 MCG/ACT nasal spray Place 2 sprays into  both nostrils daily.   Yes [provider]  nystatin cream (MYCOSTATIN) Apply 1 application topically 2 (two) times daily. 03/01/21  Yes Martyn Malay, MD  ondansetron (ZOFRAN-ODT) 4 MG disintegrating tablet Take 1 tablet (4 mg total) by mouth every 8 (eight) hours as needed for nausea or vomiting. 04/05/22  Yes Levin Erp, PA  OVER THE COUNTER MEDICATION Take 1 tablet by mouth daily. Prevagen - once daily   Yes [provider]  pantoprazole (PROTONIX) 40 MG tablet Take 1 tablet (40 mg total) by mouth 2 (two) times daily, 30-60 minutes before breakfast and dinner. 04/05/22  Yes Levin Erp, PA  promethazine (PHENERGAN) 25 MG tablet Take 1 tablet (25 mg total) by mouth every 6 (six) hours as needed for nausea or vomiting. As needed 06/07/21  Yes Martyn Malay, MD  sertraline (ZOLOFT) 25 MG tablet Take 1 tablet (25 mg total) by mouth daily. 03/03/21  Yes Martyn Malay, MD  sucralfate (CARAFATE) 1 g tablet Take 1 tablet (1 g total) by mouth daily before supper. 01/29/21  Yes Martyn Malay, MD  triamcinolone ointment (KENALOG) 0.1 % Apply 1 application topically 2 (two) times daily. 03/01/21  Yes Martyn Malay, MD  atorvastatin (LIPITOR) 80 MG tablet Take 1 tablet (80 mg total) by mouth at bedtime. Patient not taking: Reported on 05/16/2022 03/19/21   Richardo Priest, MD  ezetimibe (ZETIA) 10 MG tablet Take 1 tablet (10 mg total) by mouth daily. Patient not taking: Reported on 05/16/2022 03/19/21   Richardo Priest, MD  indapamide (LOZOL) 2.5 MG tablet Take 1 tablet (2.5 mg total) by mouth daily. Patient not taking: Reported on 05/16/2022 07/05/21   Martyn Malay, MD    Scheduled Meds:  amLODipine  10 mg Oral Daily   aspirin EC  81 mg Oral Daily   carvedilol  6.25 mg Oral Q breakfast   ezetimibe  10 mg Oral Daily   hydrALAZINE  10 mg Intravenous Q6H   pantoprazole (PROTONIX) IV  40 mg Intravenous Q12H   rosuvastatin  40 mg Oral Daily   sertraline  25 mg  Oral Daily   sucralfate  1 g Oral TID WC & HS   Infusions:  cefTRIAXone (ROCEPHIN)  IV 2 g (05/23/22 1450)   metronidazole 500 mg (05/24/22 0431)   promethazine (PHENERGAN) injection (IM or IVPB) 12.5 mg (05/24/22 0738)   ringers 100 mL/hr at 05/24/22 0429   PRN Meds: acetaminophen **OR** acetaminophen (TYLENOL) oral liquid 160 mg/5 mL **OR** acetaminophen, hydrALAZINE, labetalol, loperamide, ondansetron (ZOFRAN) IV   Allergies as of 05/15/2022 - Review Complete 05/15/2022  Allergen Reaction Noted   Penicillins Hives, Rash, and Other (See Comments) 07/24/2017   Insulin lispro prot & lispro Other (See Comments) 07/11/2018   Semaglutide Nausea And Vomiting 06/28/2019   Metformin Diarrhea 06/28/2019    Family History  Problem Relation Age of Onset   Diabetes Mellitus II Mother    Cerebrovascular Disease Mother    Hypertension Father    Heart Problems Brother        pacemaker   Diabetes Mellitus II Brother    Bipolar disorder Brother     Social History   Socioeconomic History   Marital status: Widowed    Spouse name: Not on file   Number of children: 2   Years of education: 14   Highest education level: Associate degree: academic program  Occupational History   Occupation: Scientist, product/process development: [employer]   Occupation: Retired Scientist, 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  occasional   Drug use: No   Sexual activity: Not Currently  Other Topics Concern   Not on file  Social History Narrative  Lives with daughter   Marciano Sequin 1 a day   Pharmacist, hospital for 10 years   Worked as first Quarry manager in [place] Strain: Medium Risk (03/21/2022)   Overall Financial Resource Strain (CARDIA)    Difficulty of Paying Living Expenses: Somewhat hard  Food Insecurity: No Food Insecurity (05/18/2022)   Hunger Vital Sign    Worried About Running Out of Food in the Last Year: Never true    [place] in the Last Year: Never true  Recent Concern: Food Insecurity - Food Insecurity Present (03/21/2022)   Hunger Vital Sign    Worried About Running Out of Food in the Last Year: Sometimes true    Ran Out of Food in the Last Year: Never true  Transportation Needs: No Transportation Needs (05/18/2022)   PRAPARE - Hydrologist (Medical): No    Lack of Transportation (Non-Medical): No  Physical Activity: Not on file  Stress: Stress Concern Present (02/15/2021)   [place]    Feeling of Stress : Rather much  Social Connections: Unknown (03/22/2021)   Social Connection and Isolation Panel [NHANES]    Frequency of Communication with Friends and Family: More than three times a week    Frequency of Social Gatherings with Friends and Family: More than three times a week    Attends Religious Services: Patient refused    Marine scientist or Organizations: No    Attends Archivist Meetings: Never    Marital Status: Divorced  Human resources officer Violence: Not At Risk (05/18/2022)   Humiliation, Afraid, Rape, and Kick questionnaire    Fear of Current or Ex-Partner: No    Emotionally Abused: No    Physically Abused: No    Sexually Abused: No    REVIEW OF SYSTEMS: Constitutional:  + weakness ENT:  No nose bleeds Pulm:  no SOB, no cough CV:  No palpitations, no LE edema.  GU:  No hematuria, no frequency GI: Per HPI. Heme: Denies  unusual or excessive bleeding or bruising. Transfusions: None. Neuro: Positive new onset aphasia since the stroke.  No headaches, no peripheral tingling or numbness Derm:  No itching, no rash or sores.  Endocrine:  No sweats or chills.  No polyuria or dysuria Immunization: Reviewed. Travel: Not queried.   PHYSICAL EXAM: Vital signs in last 24 hours: Vitals:   05/24/22 0710 05/24/22 1008  BP: (!) 174/86 (!) 166/63  Pulse: 82 85  Resp: 16   Temp: 98.4 F (36.9 C)   SpO2: 97%    Wt Readings from Last 3 Encounters:  05/18/22 76 kg  04/05/22 72.3 kg  02/24/22 71.8 kg    General: Patient looks chronically and acutely unwell.  Sitting comfortably in the bedside chair. Head: No facial asymmetry or swelling.  No signs of head trauma. Eyes: Conjunctiva pink.  No scleral icterus. Ears: No obvious hearing deficit Nose: No congestion or discharge Mouth: Nonspecific white coating on the tongue.  Upper full dentures, partial lower dentures.  Tongue midline.  Mucosa pink, moist Neck: No JVD, no thyromegaly Lungs: Clear bilaterally.  Reduced breath sounds on the right base.  No labored breathing.  No cough Heart: RRR.  No MRG. Abdomen: Soft.  Minor epigastric discomfort with no guarding or rebound.  Active bowel sounds.  No distention.  No organomegaly, bruits, hernias Rectal: Deferred Musc/Skeltl: No joint redness, swelling or gross  deformity. Extremities: Legs with some muscle wasting.  No edema.  Amputated left pinky toe. Neurologic: Oriented to year, place, self.  Speech is halting and slow but clearly audible.  Moves all 4 limbs, strength not tested.  No tremor. Skin: No rash, no sores, no telangiectasia. Nodes: No cervical adenopathy Psych: Cooperative, calm, pleasant.  Intake/Output from previous day: 12/25 0701 - 12/26 0700 In: 1835 [I.V.:1535; IV Piggyback:300] Out: 500 [Urine:500] Intake/Output this shift: No intake/output data recorded.  LAB RESULTS: No results for  input(s): "WBC", "HGB", "HCT", "PLT" in the last 72 hours. BMET Lab Results  Component Value Date   NA 140 05/24/2022   NA 136 05/23/2022   NA 137 05/22/2022   K 3.6 05/24/2022   K 3.5 05/23/2022   K 3.9 05/22/2022   CL 101 05/24/2022   CL 98 05/23/2022   CL 109 05/22/2022   CO2 25 05/24/2022   CO2 28 05/23/2022   CO2 22 05/22/2022   GLUCOSE 100 (H) 05/24/2022   GLUCOSE 106 (H) 05/23/2022   GLUCOSE 102 (H) 05/22/2022   BUN 35 (H) 05/24/2022   BUN 39 (H) 05/23/2022   BUN 51 (H) 05/22/2022   CREATININE 2.56 (H) 05/24/2022   CREATININE 2.76 (H) 05/23/2022   CREATININE 3.65 (H) 05/22/2022   CALCIUM 8.2 (L) 05/24/2022   CALCIUM 7.8 (L) 05/23/2022   CALCIUM 7.9 (L) 05/22/2022   LFT Recent Labs    05/22/22 0228 05/23/22 0251 05/24/22 0413  ALBUMIN 2.3* 2.3* 2.2*   PT/INR Lab Results  Component Value Date   INR 1.1 05/15/2022   INR 0.9 12/17/2020   Hepatitis Panel No results for input(s): "HEPBSAG", "HCVAB", "HEPAIGM", "HEPBIGM" in the last 72 hours. C-Diff No components found for: "CDIFF" Lipase     Component Value Date/Time   LIPASE 36 10/28/2020 1106    Drugs of Abuse  No results found for: "LABOPIA", "COCAINSCRNUR", "LABBENZ", "AMPHETMU", "THCU", "LABBARB"   RADIOLOGY STUDIES: CT ABDOMEN PELVIS WO CONTRAST  Result Date: 05/22/2022 CLINICAL DATA:  Abdominal pain with nausea vomiting and diarrhea. 12/17/2020 EXAM: CT ABDOMEN AND PELVIS WITHOUT CONTRAST TECHNIQUE: Multidetector CT imaging of the abdomen and pelvis was performed following the standard protocol without IV contrast. RADIATION DOSE REDUCTION: This exam was performed according to the departmental dose-optimization program which includes automated exposure control, adjustment of the mA and/or kV according to patient size and/or use of iterative reconstruction technique. COMPARISON:  None Available. FINDINGS: Lower chest: Bibasilar dependent atelectasis with small bilateral pleural effusions.  Hepatobiliary: No suspicious focal abnormality in the liver on this study without intravenous contrast. Gallbladder surgically absent. No intrahepatic or extrahepatic biliary dilation. Pancreas: No focal mass lesion. No dilatation of the main duct. No intraparenchymal cyst. No peripancreatic edema. Spleen: No splenomegaly. No focal mass lesion. Adrenals/Urinary Tract: No adrenal nodule or mass. 17 mm water density lesion lower pole right kidney is compatible with a cyst, stable since 10/28/2020. No followup imaging is recommended. Both kidney show heterogeneous parenchymal attenuation, nonspecific but pyelonephritis could have this appearance. No evidence for hydroureter. Bladder is moderately distended. Stomach/Bowel: Stomach is unremarkable. No gastric wall thickening. No evidence of outlet obstruction. Duodenum is normally positioned as is the ligament of Treitz. No small bowel wall thickening. No small bowel dilatation. The terminal ileum is normal. The appendix is normal. No gross colonic mass. No colonic wall thickening. Left-sided diverticulosis evident. Subtle pericolonic stranding along the descending colon (51/3) raises the question of diverticulitis. Vascular/Lymphatic: There is advanced atherosclerotic calcification of the abdominal aorta  without aneurysm. There is no gastrohepatic or hepatoduodenal ligament lymphadenopathy. No retroperitoneal or mesenteric lymphadenopathy. No pelvic sidewall lymphadenopathy. Reproductive: Hysterectomy.  There is no adnexal mass. Other: No intraperitoneal free fluid. Musculoskeletal: Bones are diffusely demineralized. Degenerative changes noted in both hips. IMPRESSION: 1. Left-sided diverticulosis with subtle pericolonic stranding along the descending colon. Imaging features raise the question of diverticulitis. 2. Both kidneys show heterogeneous parenchymal attenuation, nonspecific but pyelonephritis could have this appearance. 3. Bibasilar dependent atelectasis with  small bilateral pleural effusions. 4.  Aortic Atherosclerosis (ICD10-I70.0). Electronically Signed   By: Misty Stanley M.D.   On: 05/22/2022 12:22     IMPRESSION:   Chronic nausea with occasional nonbloody emesis.  Had severe esophagitis as well as gastritis on EGD in 2022.  Gastric emptying study October 2023 was normal.  Slight confusion as to exactly what outpatient acid controlling medications she is taking, currently she says it is Protonix 40 mg in a.m. and famotidine 20 mg in a.m.  New right-sided CVA with small SAH.  Now on Plavix.  This is another confusing issue, the med list PTA does not show Plavix and patient not aware she was taking this prior to admission although it has been in use in past, it was listed as an active medication when she was seen in the GI office a couple of months ago.  Periodic diarrhea.  None for at least 48 hours.  Previous treatment with oral vancomycin for C. difficile in June 2022 and March 2023.    UTI.  Initiated on Rocephin yesterday.  UA not impressive for UTI on 12/23 and urine culture shows insignificant growth of less than 10 K colonies/mL.   WBCs consistently normal.  No fevers    ? Diverticulitis per CT.  She lacks mid to lower abdominal pain and tenderness to palpation,  no fevers no elevation WBCs so I suspect changes on CT are not diverticulitis.  Attending MD has initiated metronidazole as of today.  WBCs consistently normal.   PLAN:       Leave PPI 40 mg IB bid and Carafate in place.  Adding schedule zofran IV.  Stopping scheduled IV reglan.  Per Dr Lorenso Courier.      Need for Rocephin? Indication "UTI"  Given hx c diff would not rec given no evidence UTI, I am stopping this.  Consider dc of metronidazole but wait for Dr Libby Maw opinion.       Azucena Freed  05/24/2022, 10:23 AM Phone [phone]

## 2022-05-24 NOTE — Progress Notes (Signed)
Physical Therapy Treatment Patient Details Name: Lauren Craig MRN: [id number] DOB: [birth year] Today's Date: 05/24/2022   History of Present Illness Pt is a 76 y/o female presenting to the ED 12/17 with slurred speech and weakness, left facial droop and decreased vision L upper visual field.  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 Pt needing mod assist this date to transfer to EOB for trunk elevation and assist to scoot out to EOB. Once EOB pt agreeable to transfer to recliner needing min assist to complete, once in chair pt agreeable to LE therex for increased LE strength and ROM. Pt declining ambulation this date despite max encouragement and education on importance and benefits. Pt continues to benefit from skilled PT services to progress toward functional mobility goals.    Recommendations for follow up therapy are one component of a multi-disciplinary discharge planning process, led by the attending physician.  Recommendations may be updated based on patient status, additional functional criteria and insurance authorization.  Follow Up Recommendations  Home health PT     Assistance Recommended at Discharge Intermittent Supervision/Assistance  Patient can return home with the following A little help with walking and/or transfers;A little help with bathing/dressing/bathroom;Assistance with cooking/housework;Assist for transportation;Help with stairs or ramp for entrance   Equipment Recommendations  Other (comment)    Recommendations for Other Services       Precautions / Restrictions Precautions Precautions: Fall Restrictions Weight Bearing Restrictions: No     Mobility  Bed Mobility Overal bed mobility: Needs  Assistance Bed Mobility: Supine to Sit     Supine to sit: Mod assist     General bed mobility comments: verbal cues and increased time with assistance to raise trunk and scoot out to EOB    Transfers Overall transfer level: Needs assistance Equipment used: Rolling walker (2 wheels) Transfers: Sit to/from Stand, Bed to chair/wheelchair/BSC Sit to Stand: Min assist   Step pivot transfers: Min assist       General transfer comment: min assist to power up and to steady    Ambulation/Gait               General Gait Details: pt declining   Stairs             Wheelchair Mobility    Modified Rankin (Stroke Patients Only) Modified Rankin (Stroke Patients Only) Pre-Morbid Rankin Score: No symptoms Modified Rankin: Moderately severe disability     Balance Overall balance assessment: Needs assistance Sitting-balance support: Single extremity supported, Feet supported Sitting balance-Leahy Scale: Fair Sitting balance - Comments: able to perform gown change seated on EOB   Standing balance support: Bilateral upper extremity supported, During functional activity Standing balance-Leahy Scale: Poor Standing balance comment: reliant on external support                            Cognition Arousal/Alertness: Awake/alert Behavior During Therapy: WFL for tasks assessed/performed Overall Cognitive Status: Within Functional Limits for tasks assessed                                 General Comments: required max encouragement to participate  Exercises General Exercises - Lower Extremity Long Arc Quad: AROM, Right, Left, 20 reps, Seated Hip ABduction/ADduction: AROM, Right, Left, 20 reps, Seated Hip Flexion/Marching: AROM, Right, Left, 20 reps, Seated    General Comments General comments (skin integrity, edema, etc.): VSS on RA, pt needing max encouragement to participate and perseverating/ distracted by eating ice chips and anxious  when ice ships moved for mobility.      Pertinent Vitals/Pain Pain Assessment Pain Assessment: Faces Faces Pain Scale: Hurts a little bit Pain Location: generalized Pain Descriptors / Indicators: Grimacing, Sore Pain Intervention(s): Limited activity within patient's tolerance, Monitored during session, Repositioned    Home Living                          Prior Function            PT Goals (current goals can now be found in the care plan section) Acute Rehab PT Goals PT Goal Formulation: With patient Time For Goal Achievement: 05/30/22 Progress towards PT goals: Progressing toward goals    Frequency    Min 3X/week      PT Plan      Co-evaluation              AM-PAC PT "6 Clicks" Mobility   Outcome Measure  Help needed turning from your back to your side while in a flat bed without using bedrails?: A Lot Help needed moving from lying on your back to sitting on the side of a flat bed without using bedrails?: A Little Help needed moving to and from a bed to a chair (including a wheelchair)?: A Little Help needed standing up from a chair using your arms (e.g., wheelchair or bedside chair)?: A Little Help needed to walk in hospital room?: A Little Help needed climbing 3-5 steps with a railing? : Total 6 Click Score: 15    End of Session Equipment Utilized During Treatment: Gait belt Activity Tolerance: Patient tolerated treatment well Patient left: in chair;with call bell/phone within reach;with chair alarm set Nurse Communication: Mobility status PT Visit Diagnosis: Other abnormalities of gait and mobility (R26.89);Other symptoms and signs involving the nervous system (R29.898);Difficulty in walking, not elsewhere classified (R26.2)     Time: 8016-5537 PT Time Calculation (min) (ACUTE ONLY): 23 min  Charges:  $Therapeutic Exercise: 8-22 mins $Therapeutic Activity: 8-22 mins                     Livier Hendel R. PTA Acute Rehabilitation  Services Office: [place] 05/24/2022, 9:41 AM

## 2022-05-25 ENCOUNTER — Telehealth: Payer: Self-pay

## 2022-05-25 DIAGNOSIS — R2990 Unspecified symptoms and signs involving the nervous system: Secondary | ICD-10-CM | POA: Diagnosis not present

## 2022-05-25 DIAGNOSIS — N170 Acute kidney failure with tubular necrosis: Secondary | ICD-10-CM | POA: Diagnosis not present

## 2022-05-25 DIAGNOSIS — R112 Nausea with vomiting, unspecified: Secondary | ICD-10-CM | POA: Diagnosis not present

## 2022-05-25 DIAGNOSIS — E118 Type 2 diabetes mellitus with unspecified complications: Secondary | ICD-10-CM | POA: Diagnosis not present

## 2022-05-25 DIAGNOSIS — I619 Nontraumatic intracerebral hemorrhage, unspecified: Secondary | ICD-10-CM | POA: Diagnosis not present

## 2022-05-25 LAB — CBC WITH DIFFERENTIAL/PLATELET
Abs Immature Granulocytes: 0.06 10*3/uL (ref 0.00–0.07)
Basophils Absolute: 0 10*3/uL (ref 0.0–0.1)
Basophils Relative: 0 %
Eosinophils Absolute: 0.1 10*3/uL (ref 0.0–0.5)
Eosinophils Relative: 1 %
HCT: 28.8 % — ABNORMAL LOW (ref 36.0–46.0)
Hemoglobin: 9.3 g/dL — ABNORMAL LOW (ref 12.0–15.0)
Immature Granulocytes: 1 %
Lymphocytes Relative: 8 %
Lymphs Abs: 0.8 10*3/uL (ref 0.7–4.0)
MCH: 26.5 pg (ref 26.0–34.0)
MCHC: 32.3 g/dL (ref 30.0–36.0)
MCV: 82.1 fL (ref 80.0–100.0)
Monocytes Absolute: 0.9 10*3/uL (ref 0.1–1.0)
Monocytes Relative: 9 %
Neutro Abs: 8.1 10*3/uL — ABNORMAL HIGH (ref 1.7–7.7)
Neutrophils Relative %: 81 %
Platelets: 281 10*3/uL (ref 150–400)
RBC: 3.51 MIL/uL — ABNORMAL LOW (ref 3.87–5.11)
RDW: 14.7 % (ref 11.5–15.5)
WBC: 10 10*3/uL (ref 4.0–10.5)
nRBC: 0 % (ref 0.0–0.2)

## 2022-05-25 LAB — RENAL FUNCTION PANEL
Albumin: 2.2 g/dL — ABNORMAL LOW (ref 3.5–5.0)
Anion gap: 14 (ref 5–15)
BUN: 38 mg/dL — ABNORMAL HIGH (ref 8–23)
CO2: 23 mmol/L (ref 22–32)
Calcium: 8.4 mg/dL — ABNORMAL LOW (ref 8.9–10.3)
Chloride: 104 mmol/L (ref 98–111)
Creatinine, Ser: 2.54 mg/dL — ABNORMAL HIGH (ref 0.44–1.00)
GFR, Estimated: 19 mL/min — ABNORMAL LOW (ref >60)
Glucose, Bld: 99 mg/dL (ref 70–99)
Phosphorus: 4.3 mg/dL (ref 2.5–4.6)
Potassium: 3.6 mmol/L (ref 3.5–5.1)
Sodium: 141 mmol/L (ref 135–145)

## 2022-05-25 LAB — LIPASE, BLOOD: Lipase: 27 U/L (ref 11–51)

## 2022-05-25 MED ORDER — BENZONATATE 100 MG PO CAPS
100.0000 mg | ORAL_CAPSULE | Freq: Three times a day (TID) | ORAL | Status: DC
  Administered 2022-05-25 – 2022-05-31 (×4): 100 mg via ORAL
  Filled 2022-05-25 (×8): qty 1

## 2022-05-25 MED ORDER — CLONIDINE HCL 0.1 MG/24HR TD PTWK
0.1000 mg | MEDICATED_PATCH | TRANSDERMAL | Status: DC
  Administered 2022-05-25: 0.1 mg via TRANSDERMAL
  Filled 2022-05-25: qty 1

## 2022-05-25 MED ORDER — GUAIFENESIN-DM 100-10 MG/5ML PO SYRP
5.0000 mL | ORAL_SOLUTION | ORAL | Status: DC | PRN
  Filled 2022-05-25 (×2): qty 5

## 2022-05-25 MED ORDER — FAMOTIDINE IN NACL 20-0.9 MG/50ML-% IV SOLN
20.0000 mg | INTRAVENOUS | Status: DC
  Administered 2022-05-25 – 2022-05-30 (×6): 20 mg via INTRAVENOUS
  Filled 2022-05-25 (×8): qty 50

## 2022-05-25 NOTE — Progress Notes (Signed)
Mobility Specialist: Progress Note   05/25/22 1318  Mobility  Activity Transferred from bed to chair;Transferred from chair to bed  Level of Assistance Moderate assist, patient does 50-74%  Assistive Device Front wheel walker  Distance Ambulated (ft) 6 ft (2'+2'+2')  Activity Response Tolerated well  Mobility Referral Yes  $Mobility charge 1 Mobility   Pre-Mobility on RA: 86 HR, 92% SpO2 During Mobility     On RA: 85% SpO2 On 2 L/min [place]: 93% SpO2 Post-Mobility on 2 L/min [place]: 89 HR, 93% SpO2  Pt received in the bed and agreeable to mobility. C/o feeling tired today and requesting to not sit in the chair d/t prolonged time sitting up yesterday. On RA upon entering room but pt desat to 85% SpO2 after sitting EOB; applied 2 L/min [place]. ModA with bed mobility as well as to stand. STS x2; pt urinary incontinent upon standing. Assisted to the chair, linen changed, and then assisted back to bed. No c/o throughout. Pt has call bell and phone at her side. Bed alarm is on.   [place] Riel Hirschman Mobility Specialist Please contact via SecureChat or Rehab office at [phone]

## 2022-05-25 NOTE — Progress Notes (Signed)
[facility] Progress Note   Assessment/ Plan:   76 year old WF-  vasculopath-  presents with new CVA/SAH and has developed Aon CRF in house  1.Renal- baseline crt 1.5 to 1.8 with history of mild proteinuria-  likely due to HTN.  Now with A on CRF in the setting of CVA and SAH-  events noted acute drop in BP and contrasted CT on 12/17. Suspected ATN plus contrast mediated AKI.  Cr peaked at 4.6 on 12/23 and has subsequently trended down to 2.5 today. UOP has been normal.  She had been requiring IVF through yesterday but looks like she's taking better po intake now - watch today off IVF to make sure she can maintain po intake.   2. Hypertension/volume  -  on amlodipine/coreg and hydralazine typically but hasn't been able to take.  On IV hyralazine currently and a clonidine patch has been added.  Hopefully can start to take po meds now.   +4.7L for admit but doesn't appear grossly overloaded.  Hold on diuretics  3. Metabolic acidosis-  mild, hold po bicarb and follow 4. Anemia  - Hb in the 9s, slight trend down, per primary. 5.  Neuro- s/p CVA and also SAH-  seems to be doing well except the oral intake issue which needs to be better for d/c 6.  N/v/d: CT scan 12/24 shows possible diverticulitis and pyelonephritis- abx have been ordered per primary   Looks like if she can take pills/maintain po intake could d/c -- Kidney function not quite to baseline but is improving and that wouldn't be a barrier to d/c.  She needs to show she can maintain PO Intake off IVF to prevent hypovolemia - trying this today.   Subjective:    Feeling much better today - says taking po intake. No new concerns or complaints.  IVF off.    Objective:   BP (!) 165/66 (BP Location: Left Arm)   Pulse 89   Temp 98.1 F (36.7 C) (Oral)   Resp 18   Ht 5\' 4"  (1.626 m)   Wt 76 kg   SpO2 98%   BMI 28.76 kg/m   Intake/Output Summary (Last 24 hours) at 05/25/2022 1434 Last data filed at 05/25/2022 0600 Gross  per 24 hour  Intake 465 ml  Output 600 ml  Net -135 ml    Weight change:   Physical Exam: Gen:NAD, sitting in bed smiling ENT: MMM CVS: rrr Resp: clear Abd: soft Ext: no LE edema NEURO: ++ Dysarthria but can speak   Imaging: No results found.  Labs: BMET Recent Labs  Lab 05/19/22 1619 05/20/22 0303 05/21/22 0235 05/22/22 0228 05/23/22 0251 05/24/22 0413 05/25/22 0039  NA 135 138 138 137 136 140 141  K 4.8 4.5 4.6 3.9 3.5 3.6 3.6  CL 112* 114* 115* 109 98 101 104  CO2 12* 16* 15* 22 28 25 23   GLUCOSE 123* 93 114* 102* 106* 100* 99  BUN 58* 60* 59* 51* 39* 35* 38*  CREATININE 4.41* 4.41* 4.64* 3.65* 2.76* 2.56* 2.54*  CALCIUM 8.4* 8.0* 8.2* 7.9* 7.8* 8.2* 8.4*  PHOS 5.9* 5.6* 5.5* 4.6 3.9 4.6 4.3    CBC Recent Labs  Lab 05/20/22 0306 05/25/22 0039  WBC 7.5 10.0  NEUTROABS  --  8.1*  HGB 9.4* 9.3*  HCT 28.4* 28.8*  MCV 81.1 82.1  PLT 223 281     Medications:     aspirin EC  81 mg Oral Daily   cloNIDine  0.1 mg Transdermal Weekly   ezetimibe  10 mg Oral Daily   hydrALAZINE  10 mg Intravenous Q6H   ondansetron (ZOFRAN) IV  4 mg Intravenous Q6H   pantoprazole (PROTONIX) IV  40 mg Intravenous Q12H   rosuvastatin  40 mg Oral Daily   sertraline  25 mg Oral Daily   sucralfate  1 g Oral TID WC & HS   Jannifer Hick MD [facility] Pager [phone]

## 2022-05-25 NOTE — Telephone Encounter (Signed)
-----   Message from Sharyn Creamer, MD sent at 05/25/2022 11:26 AM EST ----- Christie Beckers, please arrange for GI clinic follow up with Dr. Henrene Pastor or APP in 1 month for epigastric ab pain and possible diverticulitis on CT scan. Thanks.

## 2022-05-25 NOTE — Plan of Care (Signed)
  Problem: Nutrition: Goal: Risk of aspiration will decrease Outcome: Progressing   Problem: Fluid Volume: Goal: Ability to maintain a balanced intake and output will improve Outcome: Progressing   Problem: Skin Integrity: Goal: Risk for impaired skin integrity will decrease Outcome: Progressing   Problem: Elimination: Goal: Will not experience complications related to urinary retention Outcome: Progressing   Problem: Pain Managment: Goal: General experience of comfort will improve Outcome: Progressing   Problem: Pain Managment: Goal: General experience of comfort will improve Outcome: Progressing   Problem: Safety: Goal: Ability to remain free from injury will improve Outcome: Progressing

## 2022-05-25 NOTE — Telephone Encounter (Signed)
Pt scheduled to see Ellouise Newer PA 06/24/22 at 10am.

## 2022-05-25 NOTE — Progress Notes (Signed)
Gastroenterology Inpatient Follow Up    Subjective: Patient has regained her speech and was able to communicate that her N&V has resolved. She still has epigastric ab pain but states that this is chronic in nature. This ab pain is not worse than usual. She is not eating because it has been hard to swallow ever since her stroke. She was responsive to encouragement and states that she will continue to work on her swallowing. She has been swallowing ice chips to strengthen her muscles. She states that she sat up in the chair for several hours yesterday. She expressed that she is grateful for the care that she is receiving.  Objective: Vital signs in last 24 hours: Temp:  [97.7 F (36.5 C)-98.6 F (37 C)] 98.4 F (36.9 C) (12/27 0748) Pulse Rate:  [73-86] 83 (12/27 0748) Resp:  [16-19] 19 (12/27 0748) BP: (155-173)/(50-64) 169/64 (12/27 0748) SpO2:  [90 %-97 %] 91 % (12/27 0748) Last BM Date : 05/22/22  Intake/Output from previous day: 12/26 0701 - 12/27 0700 In: 465 [I.V.:465] Out: 600 [Urine:600] Intake/Output this shift: No intake/output data recorded.  General appearance: alert Resp: no increased WOB Cardio: regular rate GI: mildly tender over the epigastric area  Lab Results: Recent Labs    05/25/22 0039  WBC 10.0  HGB 9.3*  HCT 28.8*  PLT 281   BMET Recent Labs    05/23/22 0251 05/24/22 0413 05/25/22 0039  NA 136 140 141  K 3.5 3.6 3.6  CL 98 101 104  CO2 28 25 23   GLUCOSE 106* 100* 99  BUN 39* 35* 38*  CREATININE 2.76* 2.56* 2.54*  CALCIUM 7.8* 8.2* 8.4*   LFT Recent Labs    05/25/22 0039  ALBUMIN 2.2*   PT/INR No results for input(s): "LABPROT", "INR" in the last 72 hours. Hepatitis Panel No results for input(s): "HEPBSAG", "HCVAB", "HEPAIGM", "HEPBIGM" in the last 72 hours. C-Diff No results for input(s): "CDIFFTOX" in the last 72 hours.  Studies/Results: No results found.  Medications: I have reviewed the patient's current  medications. Scheduled:  aspirin EC  81 mg Oral Daily   ezetimibe  10 mg Oral Daily   hydrALAZINE  10 mg Intravenous Q6H   metoCLOPramide (REGLAN) injection  10 mg Intravenous Q6H   ondansetron (ZOFRAN) IV  4 mg Intravenous Q6H   pantoprazole (PROTONIX) IV  40 mg Intravenous Q12H   rosuvastatin  40 mg Oral Daily   sertraline  25 mg Oral Daily   sucralfate  1 g Oral TID WC & HS   Continuous: JQZ:[id number] **OR** acetaminophen (TYLENOL) oral liquid 160 mg/5 mL **OR** acetaminophen, hydrALAZINE, labetalol, loperamide, prochlorperazine  Assessment/Plan: 76 year old female with history of DM, CVA on Plavix, DM, arthritis, prior C dif, and prior esophagitis presented with slurred speech and weakness, found to have a stroke. We were consulted for N&V. She had had issues with N&V and epigastric ab pain over the last 1-2 years. In the past on EGD in 10/2020 she has been noted to have severe reflux esophagitis, gastritis, and severe duodenitis. CT A/P w/o contrast showed signs of possible diverticulitis and possible pyelonephritis, though this does not correlate with her clinical symptoms or labs. Patient's CBC showed a nml WBC and her urine studies were bland. She denies ab pain in the LLQ. Antibiotics were discontinued yesterday and patient has not had a worsening of her symptoms. Patient has not been eating well due to a poor appetite. 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   - Continue IV PPI BID for now. Once able to transition to PO, then okay to switch to PO PPI BID. Patient has recently been refusing sucralfate suspension therapy but would benefit from a 4 week course of this when she is able to tolerate it.  - Continue to work with speech therapy - Palliative care has been consulted - Outpatient colonoscopy can be considered - We will arrange for outpatient GI  follow up in 1 month - Since patient's N&V has improved, GI will sign off for now. Please call back if any new questions arise   LOS: 10 days   Sharyn Creamer 05/25/2022, 11:15 AM

## 2022-05-25 NOTE — Progress Notes (Signed)
[facility] progress note                                                                              Lauren Craig, is a 76 y.o. female, DOB - Aug 15, 1945, BSW:[id number] Admit date - 05/15/2022    Outpatient Primary MD for the patient is Martyn Malay, MD  LOS - 10  days  Chief Complaint  Patient presents with   Code Stroke       Brief summary   [name] is a 76 y.o. female with history of multiple prior strokes, hypertension, arthritis, hyperlipidemia, osteomyelitis, PVD, diabetes, esophagitis and anxiety presenting after being found down at home with dysarthria and left-sided facial droop. She has a hemorrhage that is likely a transformation from an embolic stroke. She was supposed to be discharged yesterday but her creatinine is getting worse. Cr was around 2.1 when she was admitted.  Creatinine worsened to 4.4 and TRH was consulted.   Assessment & Plan    Principal Problem:   ICH (intracerebral hemorrhage) ([place]) -Prior history of CVA, presented with strokelike symptoms on 12/17 and was found to have [place].  Stabilized from neurological standpoint. -Plan for loop recorder, cardiology following -Patient was on Plavix 75 mg daily PTA, now on aspirin 81 mg daily due to [facility].  Neurology following. 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  -Nephrology was consulted, IV fluids have now been discontinued however patient is not eating much due to persistent nausea -Creatinine plateaued at 2.5, -Persistent nausea, not taking her meds or eating   Persistent nausea vomiting, poor appetite, poor p.o. 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 difficile colitis - Per daughter, nausea is chronic issue for a year, has had extensive GI work-up, GE emptying study was normal, she had the EGD in 10/2020 which showed severe reflux esophagitis, duodenitis.  Biopsy showed chronic gastritis, negative for H. pylori, no intestinal metaplasia or dysplasia or malignancy. Patient was treated last year for C. difficile colitis  -CT A/P abdomen showed left-sided diverticulosis with diverticulitis, ?  Pyelonephritis.  UA negative for UTI -Patient was placed on IV Rocephin and Flagyl but discontinued by GI on 12/26 - GI consulted, continue Protonix 40 mg IV twice daily, added Pepcid 20 mg daily, added scheduled Zofran 4 mg IV every 6 hours, continue Compazine as needed.  Reglan discontinued - Lipase normal. -Continue Carafate, outpatient colonoscopy and follow-up in 1 month -Palliative medicine consulted for goals of care     Controlled diabetes mellitus type 2 with complications (HCC) -Recent hemoglobin A1c 6.4, -Continue sliding scale insulin while inpatient    HTN (hypertension) Difficult to Control  -BP readings have been elevated, patient has been refusing p.o. meds due to nausea -Continue IV hydralazine 10 mg every 6 hours, added clonidine patch 0.1 mg weekly    Hyperlipidemia -Continue Crestor, Zetia  PVD (peripheral vascular disease) ([place]) -Status post left fifth toe amputation  Code Status: DNR DVT Prophylaxis:  SCD's Start: 05/15/22 1253   Level of Care: Level of care: Telemetry Medical Family Communication: Updated patient's daughter Graciella Arment on phone yesterday  Disposition Plan:  Remains inpatient appropriate: Home health when ready, daughter has moved in and will provide 24/7 supervision  Procedures:    Consultants:   Nephrology Admitted by stroke service  Antimicrobials: None    Medications  aspirin EC  81 mg Oral Daily   ezetimibe  10 mg Oral Daily   hydrALAZINE  10 mg  Intravenous Q6H   metoCLOPramide (REGLAN) injection  10 mg Intravenous Q6H   ondansetron (ZOFRAN) IV  4 mg Intravenous Q6H   pantoprazole (PROTONIX) IV  40 mg Intravenous Q12H   rosuvastatin  40 mg Oral Daily   sertraline  25 mg Oral Daily   sucralfate  1 g Oral TID WC & HS      Subjective:   Victorian Gunn was seen and examined today.  Complaining of persistent nausea, poor p.o. intake, loss of appetite.  BP readings elevated due to refusal for p.o. meds.   Objective:   Vitals:   05/25/22 0310 05/25/22 0500 05/25/22 0600 05/25/22 0748  BP: (!) 173/64 (!) 170/60 (!) 155/50 (!) 169/64  Pulse: 86  75 83  Resp: 16   19  Temp: 98.6 F (37 C)   98.4 F (36.9 C)  TempSrc:    Oral  SpO2: 94%   91%  Weight:      Height:        Intake/Output Summary (Last 24 hours) at 05/25/2022 1121 Last data filed at 05/25/2022 0600 Gross per 24 hour  Intake 465 ml  Output 600 ml  Net -135 ml     Wt Readings from Last 3 Encounters:  05/18/22 76 kg  04/05/22 72.3 kg  02/24/22 71.8 kg    Physical Exam General: Alert and oriented x 3, anxious, uncomfortable Cardiovascular: S1 S2 clear, RRR.  Respiratory: CTAB, no wheezing Gastrointestinal: Soft, nontender, nondistended, NBS Ext: no pedal edema bilaterally Neuro: mild LLE weakness 4/5, bilateral upper extremities, RLE 5/5 Psych: normal affect    Data Reviewed:  I have personally reviewed following labs    CBC Lab Results  Component Value Date   WBC 10.0 05/25/2022   RBC 3.51 (L) 05/25/2022   HGB 9.3 (L) 05/25/2022   HCT 28.8 (L) 05/25/2022   MCV 82.1 05/25/2022   MCH 26.5 05/25/2022   PLT 281 05/25/2022   MCHC 32.3 05/25/2022   RDW 14.7 05/25/2022   LYMPHSABS 0.8 05/25/2022   MONOABS 0.9 05/25/2022   EOSABS 0.1 05/25/2022   BASOSABS 0.0 86/76/7209     Last metabolic panel Lab Results  Component Value Date   NA 141 05/25/2022   K 3.6 05/25/2022   CL 104 05/25/2022   CO2 23 05/25/2022   BUN 38 (H) 05/25/2022    CREATININE 2.54 (H) 05/25/2022   GLUCOSE 99 05/25/2022   GFRNONAA 19 (L) 05/25/2022   GFRAA >60 11/11/2019   CALCIUM 8.4 (L) 05/25/2022   PHOS 4.3 05/25/2022   PROT 6.9 05/15/2022   ALBUMIN 2.2 (L) 05/25/2022   LABGLOB 2.2 06/07/2021   AGRATIO 1.9 06/07/2021   BILITOT 0.8 05/15/2022   ALKPHOS 79 05/15/2022   AST 22 05/15/2022   ALT 14 05/15/2022   ANIONGAP 14 05/25/2022      Radiology Studies: I have personally reviewed the imaging studies  No  results found.     Estill Cotta M.D. [facility] 05/25/2022, 11:21 AM  Available via Epic secure chat 7am-7pm After 7 pm, please refer to night coverage provider listed on amion.

## 2022-05-25 NOTE — TOC Progression Note (Signed)
Transition of Care [facility]) - Progression Note    Patient Details  Name: Lauren Craig MRN: [id number] Date of Birth: 1946/02/06  Transition of Care [facility]) CM/SW Contact  Pollie Friar, RN Phone Number: 05/25/2022, 1:31 PM  Clinical Narrative:    TOC continues to follow for d/c needs.      Barriers to Discharge: Continued Medical Work up  Expected Discharge Plan and Services   Discharge Planning Services: CM Consult Post Acute Care Choice: [place] arrangements for the past 2 months: [place]: PT, OT, Speech Therapy HH Agency: [facility] Date [place]: 05/18/22 Time [place]: [place] Representative spoke with at [place]: [place] ([place]) Interventions [place]: No Food Insecurity (05/18/2022)  Recent Concern: [place] Present (03/21/2022)  Housing: Low Risk  (05/18/2022)  Transportation Needs: No Transportation Needs (05/18/2022)  Utilities: Not At Risk (05/18/2022)  Depression (PHQ2-9): Low Risk  (12/17/2021)  Financial Resource Strain: Medium Risk (03/21/2022)  Social Connections: Unknown (03/22/2021)  Stress: Stress Concern Present (02/15/2021)  Tobacco Use: Medium Risk (05/15/2022)    Readmission Risk Interventions     No data to display

## 2022-05-26 ENCOUNTER — Inpatient Hospital Stay (HOSPITAL_COMMUNITY): Payer: PPO

## 2022-05-26 DIAGNOSIS — I611 Nontraumatic intracerebral hemorrhage in hemisphere, cortical: Secondary | ICD-10-CM | POA: Diagnosis not present

## 2022-05-26 DIAGNOSIS — E440 Moderate protein-calorie malnutrition: Secondary | ICD-10-CM | POA: Insufficient documentation

## 2022-05-26 LAB — RENAL FUNCTION PANEL
Albumin: 2.2 g/dL — ABNORMAL LOW (ref 3.5–5.0)
Anion gap: 13 (ref 5–15)
BUN: 35 mg/dL — ABNORMAL HIGH (ref 8–23)
CO2: 26 mmol/L (ref 22–32)
Calcium: 8.5 mg/dL — ABNORMAL LOW (ref 8.9–10.3)
Chloride: 103 mmol/L (ref 98–111)
Creatinine, Ser: 2.43 mg/dL — ABNORMAL HIGH (ref 0.44–1.00)
GFR, Estimated: 20 mL/min — ABNORMAL LOW (ref >60)
Glucose, Bld: 110 mg/dL — ABNORMAL HIGH (ref 70–99)
Phosphorus: 3.5 mg/dL (ref 2.5–4.6)
Potassium: 3.4 mmol/L — ABNORMAL LOW (ref 3.5–5.1)
Sodium: 142 mmol/L (ref 135–145)

## 2022-05-26 MED ORDER — IPRATROPIUM-ALBUTEROL 0.5-2.5 (3) MG/3ML IN SOLN: 3.0000 mL | RESPIRATORY_TRACT | Status: DC | PRN

## 2022-05-26 MED ORDER — POTASSIUM CHLORIDE 20 MEQ PO PACK
20.0000 meq | PACK | Freq: Once | ORAL | Status: DC
  Filled 2022-05-26: qty 1

## 2022-05-26 MED ORDER — TRAZODONE HCL 50 MG PO TABS
50.0000 mg | ORAL_TABLET | Freq: Every evening | ORAL | Status: DC | PRN
  Administered 2022-05-30: 50 mg via ORAL
  Filled 2022-05-26: qty 1

## 2022-05-26 MED ORDER — POTASSIUM CHLORIDE 10 MEQ/100ML IV SOLN
INTRAVENOUS | Status: AC
  Administered 2022-05-26: 10 meq via INTRAVENOUS
  Filled 2022-05-26: qty 100

## 2022-05-26 MED ORDER — ONDANSETRON HCL 4 MG/2ML IJ SOLN
4.0000 mg | Freq: Four times a day (QID) | INTRAMUSCULAR | Status: DC | PRN
  Administered 2022-05-27: 4 mg via INTRAVENOUS
  Filled 2022-05-26: qty 2

## 2022-05-26 MED ORDER — POTASSIUM CHLORIDE 10 MEQ/100ML IV SOLN
10.0000 meq | INTRAVENOUS | Status: AC
  Administered 2022-05-26 (×3): 10 meq via INTRAVENOUS
  Filled 2022-05-26 (×3): qty 100

## 2022-05-26 MED ORDER — METOPROLOL TARTRATE 5 MG/5ML IV SOLN
5.0000 mg | INTRAVENOUS | Status: DC | PRN
  Administered 2022-05-28: 5 mg via INTRAVENOUS
  Filled 2022-05-26: qty 5

## 2022-05-26 MED ORDER — HYDRALAZINE HCL 20 MG/ML IJ SOLN: 10.0000 mg | INTRAMUSCULAR | Status: DC | PRN

## 2022-05-26 MED ORDER — CARVEDILOL 6.25 MG PO TABS
6.2500 mg | ORAL_TABLET | Freq: Two times a day (BID) | ORAL | Status: DC
  Administered 2022-05-27: 12.5 mg via ORAL
  Filled 2022-05-26: qty 1

## 2022-05-26 NOTE — Progress Notes (Signed)
PROGRESS NOTE    Lauren Craig  ONG:[id number] DOB: 12-06-1945 DOA: 05/15/2022 PCP: Martyn Malay, MD   Brief Narrative:  76 y.o. female with history of multiple prior strokes, hypertension, arthritis, hyperlipidemia, osteomyelitis, PVD, diabetes, esophagitis and anxiety presenting after being found down at home with dysarthria and left-sided facial droop. She has a hemorrhage that is likely a transformation from an embolic stroke. She was supposed to be discharged yesterday but her creatinine is getting worse. Cr was around 2.1 when she was admitted. Creatinine worsened to 4.4 and TR[facility] was consulted.    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  Patient was stabilized from neurology standpoint.  In the past patient was on Plavix but now will be on aspirin.  Speech and swallow is recommending dysphagia 3 diet, PT has recommended home health. EP cardiology will plan to place loop recorder prior to discharge  Abdominal bloating - Abdominal x-ray ordered.  Acute kidney injury superimposed on CKD stage IIIb, creatinine 2.43 Baseline creatinine 1.6.  Creatinine peaked at 4.6 on 12/23, nephrology is following.  IV fluids.  Suspect contrast-induced nephropathy.   Persistent nausea vomiting, poor appetite, poor p.o. 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  Per family this is chronic.  In the past patient  has had extensive GI workup including gastric emptying study which was normal, endoscopy in 2022 showing severe esophagitis/duodenitis, gastritis. -CT A/P abdomen showed left-sided diverticulosis with diverticulitis, ?  Pyelonephritis.  UA negative for UTI -Patient was placed on IV Rocephin and Flagyl but discontinued by GI on 12/26 For now GI is recommending-IV PPI twice daily, transition to p.o. when able, 4 weeks of oral Carafate, outpatient follow-up and colonoscopy with GI in 1 month. -Palliative medicine consulted for goals of care       Controlled diabetes mellitus type 2 with complications (HCC) -Recent hemoglobin A1c 6.4, Sliding scale and Accu-Cheks     HTN (hypertension) uncontrolled -Currently on IV as needed medication.  Scheduled clonidine 0.1 mg weekly, resume Coreg 6.25 mg twice daily.  IV as needed ordered.     Hyperlipidemia -Continue Crestor, Zetia     PVD (peripheral vascular disease) ([place]) -Status post left fifth toe amputation       DVT prophylaxis: SCD's Start: 05/15/22 1253 Code Status: DNR Family Communication:    Status is: Inpatient Currently awaiting her p.o. intake to improve and to make sure she is able to tolerate oral.  Thereafter home with home health.    Subjective: Seen and examined at bedside, tells me she feels nauseous but this is improved compared to yesterday.   Examination:  General exam: Appears calm and comfortable, sitting up in the chair Respiratory system: Clear to auscultation. Respiratory effort normal. Cardiovascular system: S1 & S2 heard, RRR. No JVD, murmurs, rubs, gallops or clicks. No pedal edema. Gastrointestinal system: Abdomen is nondistended, soft and nontender. No organomegaly or masses felt. Normal bowel sounds heard. Central nervous  system: Alert and oriented.  Dysarthric speech Extremities: Symmetric 5 x 5 power. Skin: No rashes, lesions or ulcers Psychiatry: Judgement and insight appear normal. Mood & affect  appropriate.     Objective: Vitals:   05/25/22 2020 05/25/22 2353 05/26/22 0313 05/26/22 0738  BP:  (!) 180/67 (!) 176/63 (!) 192/73  Pulse:  90 90 92  Resp:  16 16 17   Temp:  98.5 F (36.9 C) 98.6 F (37 C) 98 F (36.7 C)  TempSrc:  Oral Oral Oral  SpO2: 93% 94% 92% 100%  Weight:      Height:        Intake/Output Summary (Last 24 hours) at 05/26/2022 0809 Last data filed at 05/26/2022 0300 Gross per 24 hour  Intake --  Output 1000 ml  Net -1000 ml   Filed Weights   05/18/22 1958  Weight: 76 kg     Data Reviewed:   CBC: Recent Labs  Lab 05/20/22 0306 05/25/22 0039  WBC 7.5 10.0  NEUTROABS  --  8.1*  HGB 9.4* 9.3*  HCT 28.4* 28.8*  MCV 81.1 82.1  PLT 223 161   Basic Metabolic Panel: Recent Labs  Lab 05/22/22 0228 05/23/22 0251 05/24/22 0413 05/25/22 0039 05/26/22 0227  NA 137 136 140 141 142  K 3.9 3.5 3.6 3.6 3.4*  CL 109 98 101 104 103  CO2 22 28 25 23 26   GLUCOSE 102* 106* 100* 99 110*  BUN 51* 39* 35* 38* 35*  CREATININE 3.65* 2.76* 2.56* 2.54* 2.43*  CALCIUM 7.9* 7.8* 8.2* 8.4* 8.5*  PHOS 4.6 3.9 4.6 4.3 3.5   GFR: Estimated Creatinine Clearance: 19.3 mL/min (A) (by C-G formula based on SCr of 2.43 mg/dL (H)). Liver Function Tests: Recent Labs  Lab 05/22/22 0228 05/23/22 0251 05/24/22 0413 05/25/22 0039 05/26/22 0227  ALBUMIN 2.3* 2.3* 2.2* 2.2* 2.2*   Recent Labs  Lab 05/25/22 0039  LIPASE 27   No results for input(s): "AMMONIA" in the last 168 hours. Coagulation Profile: No results for input(s): "INR", "PROTIME" in the last 168 hours. Cardiac Enzymes: No results for input(s): "CKTOTAL", "CKMB", "CKMBINDEX", "TROPONINI" in the last 168 hours. BNP (last 3 results) No results for input(s): "PROBNP" in the last 8760 hours. HbA1C: No results for input(s): "HGBA1C" in the last 72 hours. CBG: Recent Labs  Lab 05/20/22 0508  GLUCAP 117*   Lipid Profile: No results for input(s): "CHOL", "HDL", "LDLCALC", "TRIG", "CHOLHDL",  "LDLDIRECT" in the last 72 hours. Thyroid Function Tests: No results for input(s): "TSH", "T4TOTAL", "FREET4", "T3FREE", "THYROIDAB" in the last 72 hours. Anemia Panel: No results for input(s): "VITAMINB12", "FOLATE", "FERRITIN", "TIBC", "IRON", "RETICCTPCT" in the last 72 hours. Sepsis Labs: Recent Labs  Lab 05/22/22 1417  PROCALCITON <0.10    Recent Results (from the past 240 hour(s))  Urine Culture     Status: Abnormal   Collection Time: 05/21/22  7:50 PM   Specimen: Urine, Clean Catch  Result Value Ref Range Status   Specimen Description URINE, CLEAN CATCH  Final   Special Requests NONE  Final   Culture (A)  Final    <10,000 COLONIES/mL INSIGNIFICANT GROWTH Performed at [facility], [place] [address], [place], [place] [address]    Report Status 05/22/2022 FINAL  Final         Radiology Studies: No results found.      Scheduled Meds:  aspirin EC  81 mg Oral Daily   benzonatate  100 mg Oral TID   cloNIDine  0.1  mg Transdermal Weekly   ezetimibe  10 mg Oral Daily   hydrALAZINE  10 mg Intravenous Q6H   pantoprazole (PROTONIX) IV  40 mg Intravenous Q12H   rosuvastatin  40 mg Oral Daily   sertraline  25 mg Oral Daily   sucralfate  1 g Oral TID WC & HS   Continuous Infusions:  famotidine (PEPCID) IV 20 mg (05/25/22 1232)     LOS: 11 days   Time spent= 35 mins    Maizie Garno Arsenio Loader, MD [facility]  If 7PM-7AM, please contact night-coverage  05/26/2022, 8:09 AM

## 2022-05-26 NOTE — Progress Notes (Signed)
Initial Nutrition Assessment  DOCUMENTATION CODES:  Non-severe (moderate) malnutrition in context of chronic illness  INTERVENTION:  Continue current diet as ordered, ordering assist Add meal preferences Magic cup TID with meals, each supplement provides 290 kcal and 9 grams of protein MVI with minerals daily  NUTRITION DIAGNOSIS:  Moderate Malnutrition (in the context of chronic illness) related to poor appetite as evidenced by mild fat depletion, mild muscle depletion.  GOAL:  Patient will meet greater than or equal to 90% of their needs  MONITOR:  PO intake, Supplement acceptance, Labs  REASON FOR ASSESSMENT:  Malnutrition Screening Tool    ASSESSMENT:  Pt with hx of HTN, HLD, prior CVAs, PVD, and DM type 2 presented to ED for stroke like symptoms and a fall. Imaging showed a trace SAH.  12/18 - MBS, DYS 3, thin liquids  Pt resting in bedside chair at the time of assessment. Not very talkative, but will nod her head and answer a few questions. Pt endorses not eating very well, not particularly interested in an ensure but states that she does like orange sherbet and fresh fruit - will add magic cup and fruit cups to tray.   Average Meal Intake: 12/18-12/27: 32% intake x 5 recorded meals  Nutritionally Relevant Medications: Scheduled Meds:  ezetimibe  10 mg Oral Daily   pantoprazole IV  40 mg Intravenous Q12H   rosuvastatin  40 mg Oral Daily   sucralfate  1 g Oral TID WC & HS   Continuous Infusions:  famotidine (PEPCID) IV 20 mg (05/25/22 1232)   potassium chloride     PRN Meds: loperamide, ondansetron, prochlorperazine  Labs Reviewed: K 3.4 BUN 35, creatinine 2.43  NUTRITION - FOCUSED PHYSICAL EXAM: Flowsheet Row Most Recent Value  Orbital Region Mild depletion  Upper Arm Region Mild depletion  Thoracic and Lumbar Region No depletion  Buccal Region Mild depletion  Temple Region Mild depletion  Clavicle Bone Region No depletion  Clavicle and Acromion Bone  Region No depletion  Scapular Bone Region No depletion  Dorsal Hand Moderate depletion  Patellar Region Severe depletion  Anterior Thigh Region Severe depletion  Posterior Calf Region Severe depletion  Edema (RD Assessment) None  Hair Reviewed  Eyes Reviewed  Mouth Reviewed  Skin Reviewed  Nails Reviewed    Diet Order:   Diet Order             DIET DYS 3 Room service appropriate? Yes with Assist; Fluid consistency: Thin  Diet effective now                   EDUCATION NEEDS:  No education needs have been identified at this time  Skin:  Skin Assessment: Reviewed RN Assessment  Last BM:  12/24 - type 6  Height:  Ht Readings from Last 1 Encounters:  05/18/22 5\' 4"  (1.626 m)    Weight:  Wt Readings from Last 1 Encounters:  05/18/22 76 kg    Ideal Body Weight:  54.5 kg  BMI:  Body mass index is 28.76 kg/m.  Estimated Nutritional Needs:  Kcal:  1600-1800 kcal/d Protein:  75-90g/d Fluid:  >/=1.8L/d    Ranell Patrick, RD, LDN Clinical Dietitian RD pager # available in [facility]  After hours/weekend pager # available in [facility]

## 2022-05-26 NOTE — Progress Notes (Signed)
Physical Therapy Treatment Patient Details Name: Lauren Craig MRN: [id number] DOB: 1945-12-05 Today's Date: 05/26/2022   History of Present Illness Pt is a 76 y/o female presenting to the ED 12/17 with slurred speech and weakness, left facial droop and decreased vision L upper visual field.  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  PMHx  anxiety d/o, HTN, osteomyelities L 5th toe, stroke, DM2    PT Comments    Pt greeted up in recliner on arrival and agreeable to session with encouragement, however limited secondary to RLE thigh pain. Pt able to come to stand x1 with min guard assist and maintain ~60 seconds before coming to sit. Pt declining ambulation and further STS secondary to pain, however agreeable to seated LE therex for increased strength and ROM. Reinforced education re; importance of continued mobility, activity recommendations and benefits of upright sitting and time OOB with pt verbalizing understanding. Pt continues to benefit from skilled PT services to progress toward functional mobility goals.     Recommendations for follow up therapy are one component of a multi-disciplinary discharge planning process, led by the attending physician.  Recommendations may be updated based on patient status, additional functional criteria and insurance authorization.  Follow Up Recommendations  Home health PT     Assistance Recommended at Discharge Intermittent Supervision/Assistance  Patient can return home with the following A little help with walking and/or transfers;A little help with bathing/dressing/bathroom;Assistance with cooking/housework;Assist for transportation;Help with stairs or ramp for entrance   Equipment Recommendations  Other (comment)    Recommendations for Other Services       Precautions / Restrictions Precautions Precautions: Fall Restrictions Weight Bearing  Restrictions: No     Mobility  Bed Mobility Overal bed mobility: Needs Assistance             General bed mobility comments: pt OOB in recliner pre and post session    Transfers Overall transfer level: Needs assistance Equipment used: Rolling walker (2 wheels) Transfers: Sit to/from Stand, Bed to chair/wheelchair/BSC Sit to Stand: Min guard           General transfer comment: min guard for safety    Ambulation/Gait               General Gait Details: pt declining 2/2 RLE pain   Stairs             Wheelchair Mobility    Modified Rankin (Stroke Patients Only) Modified Rankin (Stroke Patients Only) Pre-Morbid Rankin Score: No symptoms Modified Rankin: Moderately severe disability     Balance Overall balance assessment: Needs assistance Sitting-balance support: Single extremity supported, Feet supported Sitting balance-Leahy Scale: Fair Sitting balance - Comments: able to perform gown change seated on EOB   Standing balance support: Bilateral upper extremity supported, During functional activity Standing balance-Leahy Scale: Poor Standing balance comment: reliant on external support                            Cognition Arousal/Alertness: Awake/alert Behavior During Therapy: WFL for tasks assessed/performed Overall Cognitive Status: Within Functional Limits for tasks assessed                                 General Comments: required max encouragement to participate        Exercises General Exercises -  Lower Extremity Long Arc Quad: AROM, Left, Seated, 10 reps Hip ABduction/ADduction:  (unable to tolerate LE elevated in recliner 2/2 RLE pain) Hip Flexion/Marching: AROM, Left, Seated, 10 reps    General Comments General comments (skin integrity, edema, etc.): VSS on RA, pt needing max encouragement to participate and perseverating/ distracted by soreness/pain despite pre-medication      Pertinent Vitals/Pain  Pain Assessment Pain Assessment: Faces Faces Pain Scale: Hurts even more Pain Location: R thigh Pain Descriptors / Indicators: Grimacing, Sore Pain Intervention(s): Monitored during session, Limited activity within patient's tolerance, Premedicated before session    Home Living                          Prior Function            PT Goals (current goals can now be found in the care plan section) Acute Rehab PT Goals PT Goal Formulation: With patient Time For Goal Achievement: 05/30/22    Frequency    Min 3X/week      PT Plan      Co-evaluation              AM-PAC PT "6 Clicks" Mobility   Outcome Measure  Help needed turning from your back to your side while in a flat bed without using bedrails?: A Lot Help needed moving from lying on your back to sitting on the side of a flat bed without using bedrails?: A Little Help needed moving to and from a bed to a chair (including a wheelchair)?: A Little Help needed standing up from a chair using your arms (e.g., wheelchair or bedside chair)?: A Little Help needed to walk in hospital room?: A Little Help needed climbing 3-5 steps with a railing? : Total 6 Click Score: 15    End of Session   Activity Tolerance: Patient tolerated treatment well Patient left: in chair;with call bell/phone within reach;with chair alarm set Nurse Communication: Mobility status PT Visit Diagnosis: Other abnormalities of gait and mobility (R26.89);Other symptoms and signs involving the nervous system (R29.898);Difficulty in walking, not elsewhere classified (R26.2)     Time: 8453-6468 PT Time Calculation (min) (ACUTE ONLY): 13 min  Charges:  $Therapeutic Exercise: 8-22 mins                     Drusilla Wampole R. PTA Acute Rehabilitation Services Office: [place] 05/26/2022, 10:51 AM

## 2022-05-26 NOTE — Plan of Care (Signed)
  Problem: Intracerebral Hemorrhage Tissue Perfusion: Goal: Complications of Intracerebral Hemorrhage will be minimized Outcome: Progressing   Problem: Coping: Goal: Will verbalize positive feelings about self Outcome: Progressing   Problem: Nutrition: Goal: Risk of aspiration will decrease Outcome: Progressing   Problem: Education: Goal: Individualized Educational Video(s) Outcome: Progressing   Problem: Fluid Volume: Goal: Ability to maintain a balanced intake and output will improve Outcome: Progressing   Problem: Health Behavior/Discharge Planning: Goal: Ability to identify and utilize available resources and services will improve Outcome: Progressing Goal: Ability to manage health-related needs will improve Outcome: Progressing   Problem: Nutritional: Goal: Progress toward achieving an optimal weight will improve Outcome: Progressing   Problem: Skin Integrity: Goal: Risk for impaired skin integrity will decrease Outcome: Progressing   Problem: Tissue Perfusion: Goal: Adequacy of tissue perfusion will improve Outcome: Progressing   Problem: Health Behavior/Discharge Planning: Goal: Ability to manage health-related needs will improve Outcome: Progressing   Problem: Clinical Measurements: Goal: Ability to maintain clinical measurements within normal limits will improve Outcome: Progressing Goal: Will remain free from infection Outcome: Progressing Goal: Diagnostic test results will improve Outcome: Progressing   Problem: Elimination: Goal: Will not experience complications related to urinary retention Outcome: Progressing   Problem: Pain Managment: Goal: General experience of comfort will improve Outcome: Progressing   Problem: Safety: Goal: Ability to remain free from injury will improve Outcome: Progressing   Problem: Skin Integrity: Goal: Risk for impaired skin integrity will decrease Outcome: Progressing   Problem: Nutrition: Goal: Dietary  intake will improve Outcome: Not Progressing   Problem: Nutritional: Goal: Maintenance of adequate nutrition will improve Outcome: Not Progressing   Problem: Nutrition: Goal: Adequate nutrition will be maintained Outcome: Not Progressing

## 2022-05-26 NOTE — Progress Notes (Signed)
Occupational Therapy Treatment Patient Details Name: Lauren Craig MRN: [id number] DOB: 1946-03-25 Today's Date: 05/26/2022   History of present illness Pt is a 76 y/o female presenting to the ED 12/17 with slurred speech and weakness, left facial droop and decreased vision L upper visual field.  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 Patient continues to be setup to supervision for grooming and od assist for LB dressing. Patient able to doff sock but requires assistance with getting socks over toes and patient completing. Patient stood from recliner with min assist and min guard for static standing balance. Discharge recommendations continue to be appropriate if family is able to provide assistance and would benefit from [facility]. Acute OT to continue to follow.    Recommendations for follow up therapy are one component of a multi-disciplinary discharge planning process, led by the attending physician.  Recommendations may be updated based on patient status, additional functional criteria and insurance authorization.    Follow Up Recommendations  Home health OT     Assistance Recommended at Discharge Frequent or constant Supervision/Assistance  Patient can return home with the following  A little help with walking and/or transfers;A little help with bathing/dressing/bathroom;Assistance with cooking/housework;Assist for transportation;Help with stairs or ramp for entrance   Equipment Recommendations  None recommended by OT    Recommendations for Other Services      Precautions / Restrictions Precautions Precautions: Fall Restrictions Weight Bearing Restrictions: No       Mobility Bed Mobility Overal bed mobility: Needs Assistance Bed Mobility: Supine to  Sit     Supine to sit: Mod assist     General bed mobility comments: verbal cues for rail use with patient reaching for help to pull up with LUE    Transfers Overall transfer level: Needs assistance Equipment used: Rolling walker (2 wheels) Transfers: Sit to/from Stand, Bed to chair/wheelchair/BSC Sit to Stand: Min guard     Step pivot transfers: Min assist     General transfer comment: min guard for safety     Balance Overall balance assessment: Needs assistance Sitting-balance support: Single extremity supported, Feet supported Sitting balance-Leahy Scale: Fair     Standing balance support: Bilateral upper extremity supported, During functional activity Standing balance-Leahy Scale: Poor Standing balance comment: reliant on external support                           ADL either performed or assessed with clinical judgement   ADL Overall ADL's : Needs assistance/impaired     Grooming: Wash/dry hands;Wash/dry face;Brushing hair;Supervision/safety;Sitting Grooming Details (indicate cue type and reason): in recliner             Lower Body Dressing: Moderate assistance;Sitting/lateral leans Lower Body Dressing Details (indicate cue type and reason): changed socks with patient able to doff socks but required mod assist to donn               General ADL Comments: increased time to perform tasks    Extremity/Trunk Assessment              Vision       Perception     Praxis      Cognition Arousal/Alertness: Awake/alert Behavior During Therapy: [facility] for tasks assessed/performed  Overall Cognitive Status: Within Functional Limits for tasks assessed                                          Exercises      Shoulder Instructions       General Comments VSS on RA, pt needing max encouragement to participate and perseverating/ distracted by soreness/pain despite pre-medication    Pertinent Vitals/ Pain       Pain  Assessment Pain Assessment: Faces Faces Pain Scale: Hurts even more Pain Location: R thigh Pain Descriptors / Indicators: Grimacing, Sore Pain Intervention(s): Limited activity within patient's tolerance, Monitored during session, Repositioned  Home Living                                          Prior Functioning/Environment              Frequency  Min 2X/week        Progress Toward Goals  OT Goals(current goals can now be found in the care plan section)  Progress towards OT goals: Progressing toward goals  Acute Rehab OT Goals Patient Stated Goal: get better OT Goal Formulation: With patient Time For Goal Achievement: 06/06/22 Potential to Achieve Goals: Good ADL Goals Pt Will Perform Grooming: with supervision;standing Pt Will Perform Upper Body Bathing: with supervision;with set-up;sitting Pt Will Perform Lower Body Bathing: with set-up;with supervision;sit to/from stand Pt Will Perform Upper Body Dressing: with set-up;with supervision;sitting Pt Will Perform Lower Body Dressing: with set-up;with supervision;sit to/from stand Pt Will Transfer to Toilet: with supervision;ambulating;regular height toilet;grab bars Pt Will Perform Toileting - Clothing Manipulation and hygiene: with supervision;sit to/from stand Additional ADL Goal #1: Pt will be S in and OOB for basic ADLs  Plan Discharge plan remains appropriate    Co-evaluation                 AM-PAC OT "6 Clicks" Daily Activity     Outcome Measure   Help from another person eating meals?: A Little Help from another person taking care of personal grooming?: A Little Help from another person toileting, which includes using toliet, bedpan, or urinal?: A Little Help from another person bathing (including washing, rinsing, drying)?: A Little Help from another person to put on and taking off regular upper body clothing?: A Little Help from another person to put on and taking off regular  lower body clothing?: A Little 6 Click Score: 18    End of Session Equipment Utilized During Treatment: Gait belt;Rolling walker (2 wheels)  OT Visit Diagnosis: Unsteadiness on feet (R26.81);Other abnormalities of gait and mobility (R26.89);Muscle weakness (generalized) (M62.81)   Activity Tolerance Patient tolerated treatment well   Patient Left in chair;with call bell/phone within reach;with chair alarm set   Nurse Communication Mobility status        Time: 2080-2233 OT Time Calculation (min): 26 min  Charges: OT General Charges $OT Visit: 1 Visit OT Treatments $Self Care/Home Management : 23-37 mins  Lodema Hong, [place]  Office [phone]   Trixie Dredge 05/26/2022, 11:36 AM

## 2022-05-26 NOTE — Progress Notes (Signed)
[facility] Progress Note   Assessment/ Plan:   76 year old WF-  vasculopath-  presents with new CVA/SAH and has developed Aon CRF in house  1.Renal- baseline crt 1.5 to 1.8 with history of mild proteinuria-  likely due to HTN.  Now with A on CRF in the setting of CVA and SAH-  events noted acute drop in BP and contrasted CT on 12/17. Suspected ATN plus contrast mediated AKI.  Cr peaked at 4.6 on 12/23 and has subsequently trended down to 2.4 today. UOP has been normal.  She's been able to maintain po intake of liquids well enough to prevent volume depletion.  Hopefully kidney function will continue to improve.  I will f/u with her outpt. 2. Hypertension/volume  -  on amlodipine/coreg and hydralazine typically but hasn't been able to take pills.  On IV hyralazine currently and a clonidine patch has been added. +4.7L for admit but doesn't appear grossly overloaded.  Hold on diuretics  3. Hypokalemia: repleted 4. Anemia  - Hb in the 9s, slight trend down, per primary. 5.  Neuro- s/p CVA and also SAH-  seems to be doing well except the oral intake issue which needs to be better for d/c 6.  N/v/d: CT scan 12/24 shows possible diverticulitis and pyelonephritis- abx have been ordered per primary; GI has seen and changed some meds = the nausea looks like more of a chronic issue but seems to be more pervasive this admission.   Looks like if she can take pills could d/c -- Kidney function not quite to baseline but is improving and that wouldn't be a barrier to d/c.   I have nothing further to add - will have her see me in nephrology clinic in about 4 weeks for CKD.   Will sign off - call if we can help with anything  Subjective:    Feeling ok today - says taking fluids and yogurt well but gags on pills and cannot tolerate them. No new concerns or complaints.  IVF remain off.    Objective:   BP (!) 184/75 (BP Location: Right Arm)   Pulse 88   Temp 97.6 F (36.4 C) (Oral)   Resp 17    Ht 5\' 4"  (1.626 m)   Wt 76 kg   SpO2 97%   BMI 28.76 kg/m   Intake/Output Summary (Last 24 hours) at 05/26/2022 1235 Last data filed at 05/26/2022 0300 Gross per 24 hour  Intake --  Output 1000 ml  Net -1000 ml    Weight change:   Physical Exam: Gen:NAD, sitting in chair comfortably ENT: MMM CVS: rrr Resp: clear Abd: soft Ext: no LE edema NEURO: ++ Dysarthria but can speak   Imaging: No results found.  Labs: BMET Recent Labs  Lab 05/20/22 0303 05/21/22 0235 05/22/22 0228 05/23/22 0251 05/24/22 0413 05/25/22 0039 05/26/22 0227  NA 138 138 137 136 140 141 142  K 4.5 4.6 3.9 3.5 3.6 3.6 3.4*  CL 114* 115* 109 98 101 104 103  CO2 16* 15* 22 28 25 23 26   GLUCOSE 93 114* 102* 106* 100* 99 110*  BUN 60* 59* 51* 39* 35* 38* 35*  CREATININE 4.41* 4.64* 3.65* 2.76* 2.56* 2.54* 2.43*  CALCIUM 8.0* 8.2* 7.9* 7.8* 8.2* 8.4* 8.5*  PHOS 5.6* 5.5* 4.6 3.9 4.6 4.3 3.5    CBC Recent Labs  Lab 05/20/22 0306 05/25/22 0039  WBC 7.5 10.0  NEUTROABS  --  8.1*  HGB 9.4* 9.3*  HCT  28.4* 28.8*  MCV 81.1 82.1  PLT 223 281     Medications:     aspirin EC  81 mg Oral Daily   benzonatate  100 mg Oral TID   carvedilol  6.25 mg Oral BID WC   cloNIDine  0.1 mg Transdermal Weekly   ezetimibe  10 mg Oral Daily   hydrALAZINE  10 mg Intravenous Q6H   pantoprazole (PROTONIX) IV  40 mg Intravenous Q12H   rosuvastatin  40 mg Oral Daily   sertraline  25 mg Oral Daily   sucralfate  1 g Oral TID WC & HS   Jannifer Hick MD [facility] Pager [phone]

## 2022-05-27 DIAGNOSIS — Z7189 Other specified counseling: Secondary | ICD-10-CM | POA: Diagnosis not present

## 2022-05-27 DIAGNOSIS — Z515 Encounter for palliative care: Secondary | ICD-10-CM

## 2022-05-27 DIAGNOSIS — I611 Nontraumatic intracerebral hemorrhage in hemisphere, cortical: Secondary | ICD-10-CM | POA: Diagnosis not present

## 2022-05-27 LAB — RENAL FUNCTION PANEL
Albumin: 2.3 g/dL — ABNORMAL LOW (ref 3.5–5.0)
Anion gap: 10 (ref 5–15)
BUN: 33 mg/dL — ABNORMAL HIGH (ref 8–23)
CO2: 24 mmol/L (ref 22–32)
Calcium: 8.5 mg/dL — ABNORMAL LOW (ref 8.9–10.3)
Chloride: 107 mmol/L (ref 98–111)
Creatinine, Ser: 2.23 mg/dL — ABNORMAL HIGH (ref 0.44–1.00)
GFR, Estimated: 22 mL/min — ABNORMAL LOW (ref >60)
Glucose, Bld: 129 mg/dL — ABNORMAL HIGH (ref 70–99)
Phosphorus: 2.6 mg/dL (ref 2.5–4.6)
Potassium: 3.9 mmol/L (ref 3.5–5.1)
Sodium: 141 mmol/L (ref 135–145)

## 2022-05-27 LAB — GLUCOSE, CAPILLARY
Glucose-Capillary: 136 mg/dL — ABNORMAL HIGH (ref 70–99)
Glucose-Capillary: 131 mg/dL — ABNORMAL HIGH (ref 70–99)
Glucose-Capillary: 119 mg/dL — ABNORMAL HIGH (ref 70–99)

## 2022-05-27 LAB — MAGNESIUM: Magnesium: 1.8 mg/dL (ref 1.7–2.4)

## 2022-05-27 MED ORDER — CARVEDILOL 12.5 MG PO TABS
12.5000 mg | ORAL_TABLET | Freq: Two times a day (BID) | ORAL | Status: DC
  Administered 2022-05-31: 12.5 mg via ORAL
  Filled 2022-05-27 (×4): qty 1

## 2022-05-27 MED ORDER — NYSTATIN 100000 UNIT/ML MT SUSP
5.0000 mL | Freq: Four times a day (QID) | OROMUCOSAL | Status: DC
  Administered 2022-05-30 – 2022-05-31 (×3): 500000 [IU] via ORAL
  Filled 2022-05-27 (×7): qty 5

## 2022-05-27 MED ORDER — INSULIN ASPART 100 UNIT/ML IJ SOLN: 0.0000 [IU] | Freq: Three times a day (TID) | INTRAMUSCULAR | Status: DC

## 2022-05-27 MED ORDER — ROPINIROLE HCL 1 MG PO TABS
0.5000 mg | ORAL_TABLET | Freq: Once | ORAL | Status: DC
  Filled 2022-05-27: qty 1

## 2022-05-27 MED ORDER — PRAMIPEXOLE DIHYDROCHLORIDE 0.125 MG PO TABS
0.1250 mg | ORAL_TABLET | Freq: Every day | ORAL | Status: DC
  Filled 2022-05-27: qty 1

## 2022-05-27 MED ORDER — SODIUM CHLORIDE 0.9 % IV SOLN: INTRAVENOUS | Status: DC

## 2022-05-27 MED ORDER — ONDANSETRON HCL 4 MG/2ML IJ SOLN
4.0000 mg | Freq: Three times a day (TID) | INTRAMUSCULAR | Status: DC
  Administered 2022-05-27 – 2022-05-31 (×11): 4 mg via INTRAVENOUS
  Filled 2022-05-27 (×11): qty 2

## 2022-05-27 NOTE — Consult Note (Signed)
Palliative Medicine Inpatient Consult Note  Consulting Provider:  Mendel Corning, MD    Reason for consult:   [facility]iative [facility] Consult  Reason for Consult? Acute stroke/SAH, persistent nausea, poor p.o. diet, needs GOC   05/27/2022  HPI:  Per intake H&P --> 76 y.o. female with history of multiple prior strokes, hypertension, arthritis, hyperlipidemia, osteomyelitis, PVD, diabetes, esophagitis and anxiety presenting after being found down at home with dysarthria and left-sided facial droop. She has a hemorrhage that is likely a transformation from an embolic stroke. She was supposed to be discharged yesterday but her creatinine is getting worse. Cr was around 2.1 when she was admitted. Creatinine worsened to 4.4 and TRH was consulted.  Later patient was transferred to our service.  Nephrology was also consulted, patient received IV fluid with slowly improving her renal function.  Still has very poor oral intake, slowly improving her appetite.  She has been seen by GI due to persistent nausea and vomiting and recommended to place her on PPI twice daily for total of [redacted] weeks along with Carafate and outpatient follow-up.   Palliative care has been asked to get involved in the setting of decreased oral intake.   Clinical Assessment/Goals of Care:  *Please note that this is a verbal dictation therefore any spelling or grammatical errors are due to the "[place]" system interpretation.  I have reviewed medical records including EPIC notes, labs and imaging, received report from bedside RN, assessed the patient who was lying in bed in NAD.    I met with Cathleen Fears to further discuss diagnosis prognosis, GOC, EOL wishes, disposition and options.   I introduced Palliative Medicine as specialized medical care for people living with serious illness. It focuses on providing relief from the symptoms and stress of a serious illness. The goal is to improve  quality of life for both the patient and the family.  Medical History Review and Understanding:  I reviewed with Benny her past medical history of stroke(s), HTN, arthritis, OM, PVD, and diabetes.   Social History:  Jerney shares that she is from [place] originally though for a period of time lives in [place]. She was married though is divorced. She has two daughter one who is local and one who is in [place]. She formerly worked as Oceanographer and a Mudlogger. She is a woman of faith and practices within the [employer] denomination.   Functional and Nutritional State:  Prior to hospitalization Joyann was living alone in her home. She did not need any assistive devices  though she vocalizes that she believes she will now need to use some. She also shares that she realizes she is no longer safe to live alone therefore the plan will be to have her daughter move in with her.   Palliative Symptoms:  Restless legs - patient shares this has been ongoing and she has never been on any medication to remedy this symptom. She is open to taking medications.  Adult FTT - Patient states that she had not been hungry and is having intermittent nausea. She dos enjoy sherbet though. She shares that she would not want a feeding tube.  Nausea - is receiving zofran prior to meals  Oral thrush - plan to ensure ATC mouth care  Advance Directives:  A detailed discussion was had today regarding advanced directives.  Patient would rely on her daughter, [name] to be her primary surrogate decision maker.   Code Status:  Concepts specific to code status, artifical feeding and hydration, continued IV antibiotics and rehospitalization was had.  The difference between a aggressive medical intervention path  and a palliative comfort care path for this patient at this time was had.   Kyndal shares that she is an established DNAR/DNI - she points to her bracelet when asked  about this. She shares acceptance of her current state.  Discussion:  Saydee and I reviewed her life and the things she has been triumphant throughout. She expresses that her marriage to her husband was troublesome and fraught with challenges which she was able to overcome once she moved back to [place] and formalized a divorce.   Jimmi shares the importance of her daughters. 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   Reviewed the hit which patients kidneys have taken in the setting of having received contrast and the improvements which they are making slowly.   Tamkia shares that her hospital stay has been difficult as when she first came in she would not talk or swallow. She is glad that these two things have improved. She shares that she is most hopeful for improvements moving forward and to regain some independence.   Discussed the importance of continued conversation with family and their  medical providers regarding overall plan of care and treatment options, ensuring decisions are within the context of the patients values and GOCs.  Decision Maker: Brendi Mccarroll Daughter [phone]  SUMMARY OF RECOMMENDATIONS   DNAR/DNI  Will complete a MOST form prior to patient discharge  Introduced palliative concepts and philosophy's  Ongoing support  Code Status/Advance Care Planning: DNAR/DNI   Symptom Management:  RLS: Pramipexole 0.133m PO QHS  Adult FTT: - Dietician consult - Offer supplements  Nausea: - Continue zofran 30 minutes before meals - Continue compazine  Oral Thrush: - Nystatin QID  Palliative Prophylaxis:  Aspiration, Bowel Regimen, Delirium Protocol, Frequent Pain Assessment, Oral Care, Palliative Wound Care, and Turn Reposition  Additional Recommendations (Limitations, Scope, Preferences): Continue current care  Psycho-social/Spiritual:   Desire for further Chaplaincy support: Yes Additional Recommendations: Education on CVA   Prognosis: Multiple chronic co-morbidities, prolonged hospitalization, increased 12 month mortality risk.  Discharge Planning: Discharge to home once medically optimized.  Vitals:   05/27/22 0729 05/27/22 1128  BP: (!) 162/64 (!) 146/54  Pulse: (!) 102 (!) 102  Resp: 16 16  Temp: 99.1 F (37.3 C) 99.6 F (37.6 C)  SpO2: 91% 94%    Intake/Output Summary (Last 24 hours) at 05/27/2022 1339 Last data filed at 05/26/2022 1931 Gross per 24 hour  Intake --  Output 550 ml  Net -550 ml   Last Weight  Most recent update: 05/18/2022  7:58 PM    Weight  76 kg (167 lb 8.8 oz)            Gen:  Elderly Caucasian F in NAD HEENT: (+) white on tongue  CV: Irregular rate and rhythm, no murmurs rubs or gallops PULM:  On 3LPM [place], breathing is even and nonlabored ABD: soft/nontender  EXT: No edema  Neuro: Alert and oriented x3   PPS: 40%   This conversation/these recommendations were discussed with patient primary care team, Dr. AReesa Chew Billing based on MDM: High ______________________________________________________ [place] Cell Phone: [phone] utilize secure chat with additional questions, if there is no response within 30  minutes please call the above phone number  Palliative Medicine Team providers are available by phone from 7am to 7pm daily and can be reached through the team cell phone.  Should this patient require assistance outside of these hours, please call the patient's attending physician.

## 2022-05-27 NOTE — Progress Notes (Addendum)
Dr. Reesa Chew notified that patient will not take any PO meds this morning because she says they make her vomit.   Also made Dr. Reesa Chew aware that the patient is refusing her sliding scale insulin.

## 2022-05-27 NOTE — Progress Notes (Signed)
Brief Nutrition Note  New nutrition consult received for assessment of nutrition needs. Pt was seen by nutrition team yesterday and interventions were put in place. Will follow-up with pt as planned.  Ranell Patrick, RD, LDN Clinical Dietitian RD pager # available in [place]  After hours/weekend pager # available in [facility]

## 2022-05-27 NOTE — Progress Notes (Signed)
PROGRESS NOTE    Lauren Craig  ZGY:[id number] DOB: July 08, 1945 DOA: 05/15/2022 PCP: Martyn Malay, MD   Brief Narrative:  76 y.o. female with history of multiple prior strokes, hypertension, arthritis, hyperlipidemia, osteomyelitis, PVD, diabetes, esophagitis and anxiety presenting after being found down at home with dysarthria and left-sided facial droop. She has a hemorrhage that is likely a transformation from an embolic stroke. She was supposed to be discharged yesterday but her creatinine is getting worse. Cr was around 2.1 when she was admitted. Creatinine worsened to 4.4 and TRH was consulted.  Later patient was transferred to our service.  Nephrology was also consulted, patient received IV fluid with slowly improving her renal function.  Still has very poor oral intake, slowly improving her appetite.  She has been seen by GI due to persistent nausea and vomiting and recommended to place her on PPI twice daily for total of [redacted] weeks along with Carafate and outpatient follow-up.   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  Patient was stabilized from neurology standpoint.  In the past patient was on Plavix but now will be on aspirin.  Speech and swallow is recommending dysphagia 3 diet, PT has recommended home health.  Daughter able to provide supervision. EP cardiology will plan to place loop recorder prior to  discharge  Abdominal bloating, improved - X-rays unremarkable  Acute kidney injury superimposed on CKD stage IIIb, creatinine 2.23 Baseline creatinine 1.6.  Creatinine peaked at 4.6 on 12/23, nephrology is following.  IV fluids.  Suspect contrast-induced nephropathy.  Creatinine today 2.23.   Persistent nausea vomiting, poor appetite, poor p.o. 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  Per family this is chronic.  In the past patient has had extensive GI workup including gastric emptying study which was normal, endoscopy in 2022 showing severe esophagitis/duodenitis, gastritis. -CT A/P abdomen showed left-sided diverticulosis with diverticulitis, ?  Pyelonephritis.  UA negative for UTI -Patient was placed on IV Rocephin and Flagyl but discontinued by GI on 12/26 For now GI is recommending-IV PPI twice daily, transition to p.o. when able, 4 weeks of oral Carafate, outpatient follow-up and colonoscopy with GI in 1 month. -Palliative medicine consulted for goals of care       Controlled diabetes mellitus type 2 with complications (HCC) -Recent hemoglobin A1c 6.4, Sliding scale and Accu-Cheks, sensitive     HTN (hypertension) uncontrolled -Currently on IV as needed medication.  Scheduled clonidine 0.1 mg weekly, increase Coreg 12.5 mg twice daily.  IV as needed ordered.     Hyperlipidemia -Continue Crestor, Zetia     PVD (peripheral vascular disease) ([place]) -Status post left fifth toe amputation       DVT prophylaxis: SCD's Start: 05/15/22 1253 Code Status: DNR Family Communication:    Status is: Inpatient Currently awaiting her p.o. intake to improve and to make sure she is able to tolerate oral.  Thereafter home  with home health.  Daughter is able to provide supervision    Subjective: Doing ok. Still having nausea  Examination: Constitutional: Not in acute distress Respiratory: Clear to auscultation  bilaterally Cardiovascular: Normal sinus rhythm, no rubs Abdomen: Nontender nondistended good bowel sounds Musculoskeletal: No edema noted Skin: No rashes seen Neurologic: CN 2-12 grossly intact. Dysarthric speech.  Psychiatric: Normal judgment and insight. Alert and oriented x 3. Normal mood.     Objective: Vitals:   05/26/22 1535 05/26/22 1929 05/27/22 0300 05/27/22 0729  BP: (!) 154/55 (!) 157/55 (!) 168/73 (!) 162/64  Pulse: 75 88 99 (!) 102  Resp: 18 16 16 16   Temp: 97.6 F (36.4 C) 98.1 F (36.7 C) 98.6 F (37 C) 99.1 F (37.3 C)  TempSrc: Oral Oral Oral   SpO2: 95% (!) 87% 91% 91%  Weight:      Height:        Intake/Output Summary (Last 24 hours) at 05/27/2022 0801 Last data filed at 05/26/2022 1931 Gross per 24 hour  Intake --  Output 550 ml  Net -550 ml   Filed Weights   05/18/22 1958  Weight: 76 kg     Data Reviewed:   CBC: Recent Labs  Lab 05/25/22 0039  WBC 10.0  NEUTROABS 8.1*  HGB 9.3*  HCT 28.8*  MCV 82.1  PLT 427   Basic Metabolic Panel: Recent Labs  Lab 05/23/22 0251 05/24/22 0413 05/25/22 0039 05/26/22 0227 05/27/22 0339  NA 136 140 141 142 141  K 3.5 3.6 3.6 3.4* 3.9  CL 98 101 104 103 107  CO2 28 25 23 26 24   GLUCOSE 106* 100* 99 110* 129*  BUN 39* 35* 38* 35* 33*  CREATININE 2.76* 2.56* 2.54* 2.43* 2.23*  CALCIUM 7.8* 8.2* 8.4* 8.5* 8.5*  MG  --   --   --   --  1.8  PHOS 3.9 4.6 4.3 3.5 2.6   GFR: Estimated Creatinine Clearance: 21.1 mL/min (A) (by C-G formula based on SCr of 2.23 mg/dL (H)). Liver Function Tests: Recent Labs  Lab 05/23/22 0251 05/24/22 0413 05/25/22 0039 05/26/22 0227 05/27/22 0339  ALBUMIN 2.3* 2.2* 2.2* 2.2* 2.3*   Recent Labs  Lab 05/25/22 0039  LIPASE 27   No results for input(s): "AMMONIA" in the last 168 hours. Coagulation Profile: No results for input(s): "INR", "PROTIME" in the last 168 hours. Cardiac Enzymes: No results for input(s): "CKTOTAL", "CKMB", "CKMBINDEX", "TROPONINI"  in the last 168 hours. BNP (last 3 results) No results for input(s): "PROBNP" in the last 8760 hours. HbA1C: No results for input(s): "HGBA1C" in the last 72 hours. CBG: No results for input(s): "GLUCAP" in the last 168 hours.  Lipid Profile: No results for input(s): "CHOL", "HDL", "LDLCALC", "TRIG", "CHOLHDL", "LDLDIRECT" in the last 72 hours. Thyroid Function Tests: No results for input(s): "TSH", "T4TOTAL", "FREET4", "T3FREE", "THYROIDAB" in the last 72 hours. Anemia Panel: No results for input(s): "VITAMINB12", "FOLATE", "FERRITIN", "TIBC", "IRON", "RETICCTPCT" in the last 72 hours. Sepsis Labs: Recent Labs  Lab 05/22/22 1417  PROCALCITON <0.10    Recent Results (from the past 240 hour(s))  Urine Culture     Status: Abnormal   Collection Time: 05/21/22  7:50 PM   Specimen: Urine, Clean Catch  Result Value Ref Range Status   Specimen Description URINE, CLEAN CATCH  Final   Special Requests NONE  Final   Culture (A)  Final    <10,000 COLONIES/mL INSIGNIFICANT GROWTH Performed at [facility], [place] [address]  [place], [place] [address]    Report Status 05/22/2022 FINAL  Final         Radiology Studies: DG Abd 1 View  Result Date: 05/26/2022 CLINICAL DATA:  Abdominal distention EXAM: ABDOMEN - 1 VIEW COMPARISON:  None Available. FINDINGS: Prior cholecystectomy. Nonobstructive bowel gas pattern. No organomegaly or free air. No suspicious calcification. Vascular calcifications noted. IMPRESSION: No acute findings. Electronically Signed   By: Rolm Baptise M.D.   On: 05/26/2022 18:08        Scheduled Meds:  aspirin EC  81 mg Oral Daily   benzonatate  100 mg Oral TID   carvedilol  6.25 mg Oral BID WC   cloNIDine  0.1 mg Transdermal Weekly   ezetimibe  10 mg Oral Daily   hydrALAZINE  10 mg Intravenous Q6H   pantoprazole (PROTONIX) IV  40 mg Intravenous Q12H   potassium chloride  20 mEq Oral Once   rosuvastatin  40 mg Oral Daily   sertraline  25 mg Oral Daily    sucralfate  1 g Oral TID WC & HS   Continuous Infusions:  famotidine (PEPCID) IV 20 mg (05/26/22 1119)     LOS: 12 days   Time spent= 35 mins    Turner Baillie Arsenio Loader, MD [facility]  If 7PM-7AM, please contact night-coverage  05/27/2022, 8:01 AM

## 2022-05-28 DIAGNOSIS — I611 Nontraumatic intracerebral hemorrhage in hemisphere, cortical: Secondary | ICD-10-CM | POA: Diagnosis not present

## 2022-05-28 DIAGNOSIS — Z515 Encounter for palliative care: Secondary | ICD-10-CM | POA: Diagnosis not present

## 2022-05-28 LAB — BASIC METABOLIC PANEL
Anion gap: 10 (ref 5–15)
BUN: 26 mg/dL — ABNORMAL HIGH (ref 8–23)
CO2: 27 mmol/L (ref 22–32)
Calcium: 8.5 mg/dL — ABNORMAL LOW (ref 8.9–10.3)
Chloride: 109 mmol/L (ref 98–111)
Creatinine, Ser: 2.1 mg/dL — ABNORMAL HIGH (ref 0.44–1.00)
GFR, Estimated: 24 mL/min — ABNORMAL LOW (ref >60)
Glucose, Bld: 125 mg/dL — ABNORMAL HIGH (ref 70–99)
Potassium: 3.5 mmol/L (ref 3.5–5.1)
Sodium: 146 mmol/L — ABNORMAL HIGH (ref 135–145)

## 2022-05-28 LAB — GLUCOSE, CAPILLARY
Glucose-Capillary: 111 mg/dL — ABNORMAL HIGH (ref 70–99)
Glucose-Capillary: 103 mg/dL — ABNORMAL HIGH (ref 70–99)
Glucose-Capillary: 83 mg/dL (ref 70–99)
Glucose-Capillary: 94 mg/dL (ref 70–99)

## 2022-05-28 LAB — MAGNESIUM: Magnesium: 1.8 mg/dL (ref 1.7–2.4)

## 2022-05-28 MED ORDER — PRAMIPEXOLE DIHYDROCHLORIDE 0.125 MG PO TABS
0.1250 mg | ORAL_TABLET | Freq: Two times a day (BID) | ORAL | Status: DC
  Administered 2022-05-31: 0.125 mg via ORAL
  Filled 2022-05-28 (×8): qty 1

## 2022-05-28 MED ORDER — SODIUM CHLORIDE 0.9 % IV SOLN: INTRAVENOUS | Status: AC

## 2022-05-28 MED ORDER — MORPHINE SULFATE (PF) 2 MG/ML IV SOLN
2.0000 mg | Freq: Once | INTRAVENOUS | Status: AC
  Administered 2022-05-28: 2 mg via INTRAVENOUS
  Filled 2022-05-28: qty 1

## 2022-05-28 MED ORDER — HYDROMORPHONE HCL 1 MG/ML IJ SOLN
0.5000 mg | Freq: Once | INTRAMUSCULAR | Status: AC | PRN
  Administered 2022-05-28: 0.5 mg via INTRAVENOUS
  Filled 2022-05-28: qty 0.5

## 2022-05-28 NOTE — Plan of Care (Signed)
  Problem: Nutrition: Goal: Dietary intake will improve Outcome: Not Progressing   Problem: Health Behavior/Discharge Planning: Goal: Ability to manage health-related needs will improve Outcome: Not Progressing   Problem: Health Behavior/Discharge Planning: Goal: Ability to manage health-related needs will improve Outcome: Not Progressing   Problem: Nutrition: Goal: Adequate nutrition will be maintained Outcome: Not Progressing

## 2022-05-28 NOTE — Progress Notes (Addendum)
Palliative Medicine Inpatient Follow Up Note HPI:  76 y.o. female with history of multiple prior strokes, hypertension, arthritis, hyperlipidemia, osteomyelitis, PVD, diabetes, esophagitis and anxiety presenting after being found down at home with dysarthria and left-sided facial droop. She has a hemorrhage that is likely a transformation from an embolic stroke. She was supposed to be discharged yesterday but her creatinine is getting worse. Cr was around 2.1 when she was admitted. Creatinine worsened to 4.4 and TRH was consulted.  Later patient was transferred to our service.  Nephrology was also consulted, patient received IV fluid with slowly improving her renal function.  Still has very poor oral intake, slowly improving her appetite.  She has been seen by GI due to persistent nausea and vomiting and recommended to place her on PPI twice daily for total of [redacted] weeks along with Carafate and outpatient follow-up.    Palliative care has been asked to get involved in the setting of decreased oral intake.   Today's Discussion 05/28/2022  *Please note that this is a verbal dictation therefore any spelling or grammatical errors are due to the "[employer]" system interpretation.  Chart reviewed inclusive of vital signs, progress notes, laboratory results, and diagnostic images.   I met with Pamala Hurry at bedside this morning. She feels that her restless legs are under better control at this time. She shares that she is willing to get out of bed later on in the day. She remains to have a poor appetite at this time. Discussed consideration of appetite stimulants in the oncoming day or so if this continues. Emphasized the importance of oral intake.  Created space and opportunity for patient to explore thoughts feelings and fears regarding current medical situation.  We completed a MOST for this morning as below:  Cardiopulmonary Resuscitation: Do Not Attempt Resuscitation (DNR/No CPR)  Medical  Interventions: Limited Additional Interventions: Use medical treatment, IV fluids and cardiac monitoring as indicated, DO NOT USE intubation or mechanical ventilation. May consider use of less invasive airway support such as BiPAP or CPAP. Also provide comfort measures. Transfer to the hospital if indicated. Avoid intensive care.   Antibiotics: Determine use of limitation of antibiotics when infection occurs  IV Fluids: IV fluids for a defined trial period  Feeding Tube: No feeding tube   I spoke to patients daughter, [name] and provided an updated in terms of patients present situation. Vocalized my concerns given patients lack of appetite and described the reality that if Kynzlee does not desire food/drink and does not want artificial nutrition then our options are limited. I shared that a consideration that time would be hospice care. 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 With the mention of this she was reasonably emotional. Provided support through therapeutic listening. We have agreed to meet with one another tomorrow at 11AM for further conversations.  Questions and concerns addressed/Palliative Support Provided.   Objective Assessment: Vital Signs Vitals:   05/27/22 2330 05/28/22 0348  BP: (!) 153/47 (!) 148/61  Pulse: 83 77  Resp: 18 18  Temp: 98.6 F (37 C) 97.9 F (36.6 C)  SpO2: 94% 94%    Intake/Output Summary (Last 24 hours) at 05/28/2022 0734 Last data filed at 05/28/2022 0347 Gross per 24 hour  Intake 797.7 ml  Output 550 ml  Net 247.7 ml   Last Weight  Most recent update: 05/28/2022  3:50 AM    Weight  79.2 kg (174 lb 9.7 oz)  Gen:  Elderly Caucasian F in NAD HEENT: (+) white on tongue  CV: Irregular rate and rhythm PULM:  On 3LPM [place], breathing is even and nonlabored ABD: soft/nontender  EXT: No edema  Neuro: Alert and oriented x3   SUMMARY OF RECOMMENDATIONS   DNAR/DNI   MOST completed and placed in  chart  Updates provided to patients daughter, [name] --> Expressed my concerns regarding patients lack of POs and introduced the idea of hospice    Ongoing support   Code Status/Advance Care Planning: DNAR/DNI   Symptom Management:  RLS: Pramipexole 0.147m PO BID   Adult FTT: - Dietician consult order  per notes on 12/28 - Offer supplements - 1:1 feeding  Generalized Weakness: - OOB Daily   Nausea: - Continue zofran 30 minutes before meals - Continue compazine as needed   Oral Thrush: - Nystatin QID  Time Spent: 65 Billing based on MDM: High  ______________________________________________________________________________________ [facility] Team Cell Phone: [phone] utilize secure chat with additional questions, if there is no response within 30 minutes please call the above phone number  Palliative Medicine Team providers are available by phone from 7am to 7pm daily and can be reached through the team cell phone.  Should this patient require assistance outside of these hours, please call the patient's attending physician.

## 2022-05-28 NOTE — Progress Notes (Signed)
PROGRESS NOTE    Lauren Craig  EXB:[id number] DOB: [birth year] DOA: 05/15/2022 PCP: Martyn Malay, MD   Brief Narrative:  76 y.o. female with history of multiple prior strokes, hypertension, arthritis, hyperlipidemia, osteomyelitis, PVD, diabetes, esophagitis and anxiety presenting after being found down at home with dysarthria and left-sided facial droop. She has a hemorrhage that is likely a transformation from an embolic stroke. She was supposed to be discharged yesterday but her creatinine is getting worse. Cr was around 2.1 when she was admitted. Creatinine worsened to 4.4 and TRH was consulted.  Later patient was transferred to our service.  Nephrology was also consulted, patient received IV fluid with slowly improving her renal function.  Still has very poor oral intake, slowly improving her appetite.  She has been seen by GI due to persistent nausea and vomiting and recommended to place her on PPI twice daily for total of [redacted] weeks along with Carafate and outpatient follow-up.   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  Patient was stabilized from neurology standpoint.  In the past patient was on Plavix but now will be on aspirin.  Speech and swallow is recommending dysphagia 3 diet, PT has recommended home health.  Daughter able to provide supervision. EP cardiology will plan to place loop recorder prior to  discharge, they will follow-up next week  Abdominal bloating, improved - X-rays unremarkable  Acute kidney injury superimposed on CKD stage IIIb, creatinine 2.23 Baseline creatinine 1.6.  Creatinine peaked at 4.6 on 12/23, nephrology is following.  IV fluids.  Suspect contrast-induced nephropathy.  Creatinine today 2.10   Persistent nausea vomiting, poor appetite, poor p.o. 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  Per family this is chronic.  In the past patient has had extensive GI workup including gastric emptying study which was normal, endoscopy in 2022 showing severe esophagitis/duodenitis, gastritis. -CT A/P abdomen showed left-sided diverticulosis with diverticulitis, ?  Pyelonephritis.  UA negative for UTI -Patient was placed on IV Rocephin and Flagyl but discontinued by GI on 12/26 For now GI is recommending-IV PPI twice daily, transition to p.o. when able, 4 weeks of oral Carafate, outpatient follow-up and colonoscopy with GI in 1 month. Seen by palliative care, MOST form filled out      Controlled diabetes mellitus type 2 with complications ([place]) -Recent hemoglobin A1c 6.4, Sliding scale and Accu-Cheks, sensitive     HTN (hypertension) uncontrolled -Currently on IV as needed medication.  Scheduled clonidine 0.1 mg weekly, increased Coreg 12.5 mg twice daily.  IV as needed ordered.     Hyperlipidemia -Continue Crestor, Zetia     PVD (peripheral vascular disease) ([place]) -Status post left fifth toe amputation       DVT prophylaxis: SCD's Start: 05/15/22 1253 Code Status: DNR Family Communication: Daughter up-to-date, updated yesterday  Status is: Inpatient Currently awaiting her p.o. intake to improve and to make sure she is  able to tolerate oral.  Thereafter home with home health.  Daughter is able to provide supervision    Subjective: Seen and examined at bedside.  During my visit she felt all right and did not have  any symptoms of nausea.  Tells me she just received antiemetic prior to my evaluation.  Examination: Constitutional: Not in acute distress Respiratory: Clear to auscultation bilaterally Cardiovascular: Normal sinus rhythm, no rubs Abdomen: Nontender nondistended good bowel sounds Musculoskeletal: No edema noted Skin: No rashes seen Neurologic: CN 2-12 grossly intact.  Dysarthric speech Psychiatric: Normal judgment and insight. Alert and oriented x 3. Normal mood.  Objective: Vitals:   05/27/22 1614 05/27/22 1937 05/27/22 2330 05/28/22 0348  BP: (!) 145/60 (!) 175/69 (!) 153/47 (!) 148/61  Pulse: 99 91 83 77  Resp: 16 20 18 18   Temp: 98.6 F (37 C) 99 F (37.2 C) 98.6 F (37 C) 97.9 F (36.6 C)  TempSrc: Oral Oral Oral Oral  SpO2: 92% 90% 94% 94%  Weight:    79.2 kg  Height:        Intake/Output Summary (Last 24 hours) at 05/28/2022 0741 Last data filed at 05/28/2022 0347 Gross per 24 hour  Intake 797.7 ml  Output 550 ml  Net 247.7 ml   Filed Weights   05/18/22 1958 05/28/22 0348  Weight: 76 kg 79.2 kg     Data Reviewed:   CBC: Recent Labs  Lab 05/25/22 0039  WBC 10.0  NEUTROABS 8.1*  HGB 9.3*  HCT 28.8*  MCV 82.1  PLT 564   Basic Metabolic Panel: Recent Labs  Lab 05/23/22 0251 05/24/22 0413 05/25/22 0039 05/26/22 0227 05/27/22 0339 05/28/22 0244  NA 136 140 141 142 141 146*  K 3.5 3.6 3.6 3.4* 3.9 3.5  CL 98 101 104 103 107 109  CO2 28 25 23 26 24 27   GLUCOSE 106* 100* 99 110* 129* 125*  BUN 39* 35* 38* 35* 33* 26*  CREATININE 2.76* 2.56* 2.54* 2.43* 2.23* 2.10*  CALCIUM 7.8* 8.2* 8.4* 8.5* 8.5* 8.5*  MG  --   --   --   --  1.8 1.8  PHOS 3.9 4.6 4.3 3.5 2.6  --    GFR: Estimated Creatinine Clearance: 22.8 mL/min (A) (by C-G formula based on SCr of 2.1 mg/dL (H)). Liver Function Tests: Recent Labs  Lab 05/23/22 0251 05/24/22 0413 05/25/22 0039 05/26/22 0227 05/27/22 0339  ALBUMIN 2.3* 2.2* 2.2* 2.2* 2.3*   Recent Labs  Lab  05/25/22 0039  LIPASE 27   No results for input(s): "AMMONIA" in the last 168 hours. Coagulation Profile: No results for input(s): "INR", "PROTIME" in the last 168 hours. Cardiac Enzymes: No results for input(s): "CKTOTAL", "CKMB", "CKMBINDEX", "TROPONINI" in the last 168 hours. BNP (last 3 results) No results for input(s): "PROBNP" in the last 8760 hours. HbA1C: No results for input(s): "HGBA1C" in the last 72 hours. CBG: Recent Labs  Lab 05/27/22 1127 05/27/22 1616 05/27/22 2204 05/28/22 0603  GLUCAP 136* 131* 119* 111*    Lipid Profile: No results for input(s): "CHOL", "HDL", "LDLCALC", "TRIG", "CHOLHDL", "LDLDIRECT" in the last 72 hours. Thyroid Function Tests: No results for input(s): "TSH", "T4TOTAL", "FREET4", "T3FREE", "THYROIDAB" in the last 72 hours. Anemia Panel: No results for input(s): "VITAMINB12", "FOLATE", "FERRITIN", "TIBC", "IRON", "RETICCTPCT" in the last 72 hours. Sepsis Labs: Recent Labs  Lab 05/22/22 1417  PROCALCITON <0.10    Recent Results (from the past 240 hour(s))  Urine Culture     Status: Abnormal  Collection Time: 05/21/22  7:50 PM   Specimen: Urine, Clean Catch  Result Value Ref Range Status   Specimen Description URINE, CLEAN CATCH  Final   Special Requests NONE  Final   Culture (A)  Final    <10,000 COLONIES/mL INSIGNIFICANT GROWTH Performed at [facility], [address] [place], [place] [address]    Report Status 05/22/2022 FINAL  Final         Radiology Studies: DG Abd 1 View  Result Date: 05/26/2022 CLINICAL DATA:  Abdominal distention EXAM: ABDOMEN - 1 VIEW COMPARISON:  None Available. FINDINGS: Prior cholecystectomy. Nonobstructive bowel gas pattern. No organomegaly or free air. No suspicious calcification. Vascular calcifications noted. IMPRESSION: No acute findings. Electronically Signed   By: Rolm Baptise M.D.   On: 05/26/2022 18:08        Scheduled Meds:  aspirin EC  81 mg Oral Daily   benzonatate   100 mg Oral TID   carvedilol  12.5 mg Oral BID WC   cloNIDine  0.1 mg Transdermal Weekly   ezetimibe  10 mg Oral Daily   hydrALAZINE  10 mg Intravenous Q6H   insulin aspart  0-6 Units Subcutaneous TID WC   nystatin  5 mL Oral QID   ondansetron (ZOFRAN) IV  4 mg Intravenous TID AC   pantoprazole (PROTONIX) IV  40 mg Intravenous Q12H   potassium chloride  20 mEq Oral Once   pramipexole  0.125 mg Oral BID   rOPINIRole  0.5 mg Oral Once   rosuvastatin  40 mg Oral Daily   sertraline  25 mg Oral Daily   sucralfate  1 g Oral TID WC & HS   Continuous Infusions:  sodium chloride     famotidine (PEPCID) IV 20 mg (05/27/22 1025)     LOS: 13 days   Time spent= 35 mins    Aleighna Wojtas Arsenio Loader, MD [facility]  If 7PM-7AM, please contact night-coverage  05/28/2022, 7:41 AM

## 2022-05-28 NOTE — Plan of Care (Signed)
  Problem: Intracerebral Hemorrhage Tissue Perfusion: Goal: Complications of Intracerebral Hemorrhage will be minimized Outcome: Progressing   Problem: Education: Goal: Individualized Educational Video(s) Outcome: Progressing   Problem: Fluid Volume: Goal: Ability to maintain a balanced intake and output will improve Outcome: Progressing   Problem: Health Behavior/Discharge Planning: Goal: Ability to identify and utilize available resources and services will improve Outcome: Progressing Goal: Ability to manage health-related needs will improve Outcome: Progressing   Problem: Nutritional: Goal: Progress toward achieving an optimal weight will improve Outcome: Progressing   Problem: Skin Integrity: Goal: Risk for impaired skin integrity will decrease Outcome: Progressing   Problem: Tissue Perfusion: Goal: Adequacy of tissue perfusion will improve Outcome: Progressing   Problem: Health Behavior/Discharge Planning: Goal: Ability to manage health-related needs will improve Outcome: Progressing   Problem: Clinical Measurements: Goal: Ability to maintain clinical measurements within normal limits will improve Outcome: Progressing Goal: Will remain free from infection Outcome: Progressing Goal: Diagnostic test results will improve Outcome: Progressing   Problem: Elimination: Goal: Will not experience complications related to urinary retention Outcome: Progressing   Problem: Safety: Goal: Ability to remain free from injury will improve Outcome: Progressing   Problem: Skin Integrity: Goal: Risk for impaired skin integrity will decrease Outcome: Progressing   Problem: Coping: Goal: Will verbalize positive feelings about self Outcome: Not Progressing   Problem: Nutrition: Goal: Risk of aspiration will decrease Outcome: Not Progressing Goal: Dietary intake will improve Outcome: Not Progressing   Problem: Nutritional: Goal: Maintenance of adequate nutrition will  improve Outcome: Not Progressing   Problem: Nutrition: Goal: Adequate nutrition will be maintained Outcome: Not Progressing   Problem: Pain Managment: Goal: General experience of comfort will improve Outcome: Not Progressing

## 2022-05-29 DIAGNOSIS — Z515 Encounter for palliative care: Secondary | ICD-10-CM | POA: Diagnosis not present

## 2022-05-29 DIAGNOSIS — Z7189 Other specified counseling: Secondary | ICD-10-CM | POA: Diagnosis not present

## 2022-05-29 LAB — MAGNESIUM: Magnesium: 1.7 mg/dL (ref 1.7–2.4)

## 2022-05-29 LAB — RENAL FUNCTION PANEL
Albumin: 1.9 g/dL — ABNORMAL LOW (ref 3.5–5.0)
Anion gap: 14 (ref 5–15)
BUN: 27 mg/dL — ABNORMAL HIGH (ref 8–23)
CO2: 20 mmol/L — ABNORMAL LOW (ref 22–32)
Calcium: 8.2 mg/dL — ABNORMAL LOW (ref 8.9–10.3)
Chloride: 110 mmol/L (ref 98–111)
Creatinine, Ser: 2.07 mg/dL — ABNORMAL HIGH (ref 0.44–1.00)
GFR, Estimated: 24 mL/min — ABNORMAL LOW
Glucose, Bld: 93 mg/dL (ref 70–99)
Phosphorus: 3.2 mg/dL (ref 2.5–4.6)
Potassium: 3.6 mmol/L (ref 3.5–5.1)
Sodium: 144 mmol/L (ref 135–145)

## 2022-05-29 LAB — GLUCOSE, CAPILLARY
Glucose-Capillary: 90 mg/dL (ref 70–99)
Glucose-Capillary: 96 mg/dL (ref 70–99)
Glucose-Capillary: 98 mg/dL (ref 70–99)
Glucose-Capillary: 85 mg/dL (ref 70–99)

## 2022-05-29 MED ORDER — AMLODIPINE BESYLATE 10 MG PO TABS
10.0000 mg | ORAL_TABLET | Freq: Every day | ORAL | Status: DC
  Administered 2022-05-31: 10 mg via ORAL
  Filled 2022-05-29 (×2): qty 1

## 2022-05-29 NOTE — Progress Notes (Addendum)
PROGRESS NOTE    Lauren Craig  BZJ:[id number] DOB: 05/22/1946 DOA: 05/15/2022 PCP: Martyn Malay, MD   Brief Narrative:  76 y.o. female with history of multiple prior strokes, hypertension, arthritis, hyperlipidemia, osteomyelitis, PVD, diabetes, esophagitis and anxiety presenting after being found down at home with dysarthria and left-sided facial droop. She has a hemorrhage that is likely a transformation from an embolic stroke. She was supposed to be discharged yesterday but her creatinine is getting worse. Cr was around 2.1 when she was admitted. Creatinine worsened to 4.4 and TRH was consulted.  Later patient was transferred to our service.  Nephrology was also consulted, patient received IV fluid with slowly improving her renal function.  Still has very poor oral intake, slowly improving her appetite.  She has been seen by GI due to persistent nausea and vomiting and recommended to place her on PPI twice daily for total of [redacted] weeks along with Carafate and outpatient follow-up.  **05/29/22 afternoon - patient called staff to bedside to rescind DNR. Unclear what/if anything happened as she was agreeable to no vent/CPR this morning with myself and palliative but will honor her wishes.   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 Patient was stabilized from neurology standpoint. In the past  patient was on Plavix but now will be on aspirin. Speech and swallow is recommending dysphagia 3 diet, PT has recommended home health. Daughter able to provide supervision. EP cardiology will plan to place loop recorder prior to discharge, they will follow-up later this week after the holiday  Acute kidney injury superimposed on CKD stage IIIb, improving Baseline creatinine 1.6.  Creatinine peaked at 4.6 on 12/23, nephrology is following. IV fluids.  Suspect contrast-induced nephropathy. Creatinine improving   Persistent nausea vomiting, poor appetite, poor p.o. intake, improved Dysphagia after stroke Abdominal bloating, improved - X-rays unremarkable - prior history of prior C. difficile colitis.  Per family this is chronic.  In the past patient has had extensive GI workup including gastric emptying study which was normal, endoscopy in 2022 showing severe esophagitis, duodenitis, and gastritis. -CT A/P abdomen showed left-sided diverticulosis with diverticulitis, ?  Pyelonephritis.  UA negative for UTI -Patient was placed on IV Rocephin and Flagyl but discontinued by GI on 12/26 For now GI is recommending-IV PPI twice daily, transition to p.o. when able, 4 weeks of oral Carafate, outpatient follow-up and colonoscopy with GI in 1 month. Seen by palliative care, MOST form filled out    Well controlled diabetes mellitus type 2 with complications ([place]) -Recent hemoglobin A1c 6.4, Sliding scale and Accu-Cheks, sensitive   HTN (hypertension) uncontrolled -Add back home amlodipine - Continue clonidine 0.1 patch, coreg 12.5 bid, PRN IV meds ongoing.   Hyperlipidemia -Continue Crestor, Zetia   PVD (peripheral vascular disease) ([place]) -Status post left fifth toe amputation  DVT prophylaxis: SCD's Start: 05/15/22 1253 Code Status: FULL CODE **As of 05/29/22 at [place]  Communication: Daughter up-to-date, updated  Status is: Inpatient Currently awaiting her p.o. intake to improve and  to make sure she is able to tolerate oral.  Thereafter home with home health.  Daughter is able to provide supervision    Subjective: Seen and examined at bedside.  During my visit she felt all right and did not have any symptoms of nausea.  Tells me she just received antiemetic prior to my evaluation.  Examination: Constitutional: Not in acute distress Respiratory: Clear to auscultation bilaterally Cardiovascular: Normal sinus rhythm, no rubs Abdomen: Nontender nondistended good bowel sounds Musculoskeletal: No edema noted Skin: No rashes seen Neurologic: CN 2-12 grossly intact.  Dysarthric speech Psychiatric: Alert and oriented x 3. Normal mood.   Objective: Vitals:   05/28/22 2312 05/28/22 2313 05/29/22 0319 05/29/22 0721  BP: (!) 164/50  (!) 147/68 (!) 170/68  Pulse: 68 83 91 83  Resp: 14  18 18   Temp: 98.3 F (36.8 C)  98.8 F (37.1 C) 98 F (36.7 C)  TempSrc:    Oral  SpO2: 94% 95% 100% 93%  Weight:      Height:        Intake/Output Summary (Last 24 hours) at 05/29/2022 0738 Last data filed at 05/29/2022 0300 Gross per 24 hour  Intake 240 ml  Output 450 ml  Net -210 ml    Filed Weights   05/18/22 1958 05/28/22 0348  Weight: 76 kg 79.2 kg     Data Reviewed:   CBC: Recent Labs  Lab 05/25/22 0039  WBC 10.0  NEUTROABS 8.1*  HGB 9.3*  HCT 28.8*  MCV 82.1  PLT 976    Basic Metabolic Panel: Recent Labs  Lab 05/24/22 0413 05/25/22 0039 05/26/22 0227 05/27/22 0339 05/28/22 0244 05/29/22 0219 05/29/22 0220  NA 140 141 142 141 146*  --  144  K 3.6 3.6 3.4* 3.9 3.5  --  3.6  CL 101 104 103 107 109  --  110  CO2 25 23 26 24 27   --  20*  GLUCOSE 100* 99 110* 129* 125*  --  93  BUN 35* 38* 35* 33* 26*  --  27*  CREATININE 2.56* 2.54* 2.43* 2.23* 2.10*  --  2.07*  CALCIUM 8.2* 8.4* 8.5* 8.5* 8.5*  --  8.2*  MG  --   --   --  1.8 1.8 1.7  --   PHOS 4.6 4.3 3.5 2.6  --   --  3.2    GFR: Estimated Creatinine Clearance: 23.2 mL/min (A) (by C-G  formula based on SCr of 2.07 mg/dL (H)). Liver Function Tests: Recent Labs  Lab 05/24/22 0413 05/25/22 0039 05/26/22 0227 05/27/22 0339 05/29/22 0220  ALBUMIN 2.2* 2.2* 2.2* 2.3* 1.9*    Recent Labs  Lab 05/25/22 0039  LIPASE 27    No results for input(s): "AMMONIA" in the last 168 hours. Coagulation Profile: No results for input(s): "INR", "PROTIME" in the last 168 hours. Cardiac Enzymes: No results for input(s): "CKTOTAL", "CKMB", "CKMBINDEX", "TROPONINI" in the last 168 hours. BNP (last 3 results) No results for input(s): "PROBNP" in the last 8760 hours. HbA1C: No results for input(s): "HGBA1C" in the last 72 hours. CBG: Recent Labs  Lab 05/28/22 0603 05/28/22 1132 05/28/22 1601 05/28/22 2053 05/29/22 0612  GLUCAP 111* 103* 83 94 90     Lipid Profile: No results for input(s): "CHOL", "HDL", "LDLCALC", "TRIG", "CHOLHDL", "LDLDIRECT" in the last 72 hours. Thyroid Function Tests: No results for input(s): "TSH", "T4TOTAL", "FREET4", "T3FREE", "THYROIDAB"  in the last 72 hours. Anemia Panel: No results for input(s): "VITAMINB12", "FOLATE", "FERRITIN", "TIBC", "IRON", "RETICCTPCT" in the last 72 hours. Sepsis Labs: Recent Labs  Lab 05/22/22 1417  PROCALCITON <0.10     Recent Results (from the past 240 hour(s))  Urine Culture     Status: Abnormal   Collection Time: 05/21/22  7:50 PM   Specimen: Urine, Clean Catch  Result Value Ref Range Status   Specimen Description URINE, CLEAN CATCH  Final   Special Requests NONE  Final   Culture (A)  Final    <10,000 COLONIES/mL INSIGNIFICANT GROWTH Performed at [facility], [place] [address], [place], [place] [address]    Report Status 05/22/2022 FINAL  Final         Radiology Studies: No results found.      Scheduled Meds:  aspirin EC  81 mg Oral Daily   benzonatate  100 mg Oral TID   carvedilol  12.5 mg Oral BID WC   cloNIDine  0.1 mg Transdermal Weekly   ezetimibe  10 mg Oral Daily    hydrALAZINE  10 mg Intravenous Q6H   insulin aspart  0-6 Units Subcutaneous TID WC   nystatin  5 mL Oral QID   ondansetron (ZOFRAN) IV  4 mg Intravenous TID AC   pantoprazole (PROTONIX) IV  40 mg Intravenous Q12H   potassium chloride  20 mEq Oral Once   pramipexole  0.125 mg Oral BID   rOPINIRole  0.5 mg Oral Once   rosuvastatin  40 mg Oral Daily   sertraline  25 mg Oral Daily   sucralfate  1 g Oral TID WC & HS   Continuous Infusions:  sodium chloride 75 mL/hr at 05/29/22 0639   famotidine (PEPCID) IV 20 mg (05/28/22 0817)     LOS: 14 days   Time spent= 35 mins    Little Ishikawa, DO [facility]  If 7PM-7AM, please contact night-coverage  05/29/2022, 7:38 AM

## 2022-05-29 NOTE — Progress Notes (Signed)
Palliative Medicine Inpatient Follow Up Note HPI:  76 y.o. female with history of multiple prior strokes, hypertension, arthritis, hyperlipidemia, osteomyelitis, PVD, diabetes, esophagitis and anxiety presenting after being found down at home with dysarthria and left-sided facial droop. She has a hemorrhage that is likely a transformation from an embolic stroke. She was supposed to be discharged yesterday but her creatinine is getting worse. Cr was around 2.1 when she was admitted. Creatinine worsened to 4.4 and TRH was consulted.  Later patient was transferred to our service.  Nephrology was also consulted, patient received IV fluid with slowly improving her renal function.  Still has very poor oral intake, slowly improving her appetite.  She has been seen by GI due to persistent nausea and vomiting and recommended to place her on PPI twice daily for total of [redacted] weeks along with Carafate and outpatient follow-up.    Palliative care has been asked to get involved in the setting of decreased oral intake.   Today's Discussion 05/29/2022  *Please note that this is a verbal dictation therefore any spelling or grammatical errors are due to the "[name] [employer]" system interpretation.  Chart reviewed inclusive of vital signs, progress notes, laboratory results, and diagnostic images.   I met with Jeriann and her daughter, [name] at bedside this late morning. Alexie was sitting up and noted to be eating in drinking well. We reviewed patients labs and improvement in her overall kidney function. Discussed the hope for patient to get a loop recorder and transition home as early as Tuesday.   Discussed the ongoing importance of mobility and adequate nutritional intake. Plan per patient is to walk today with her daughter.   From the perspective of symptoms RLS is improving. Patients nausea under better control with zofran ATC. Qtc stable prior to initiation.   Goals at this time are for continued  improvement in function.   Questions and concerns addressed/Palliative Support Provided.   Objective Assessment: Vital Signs Vitals:   05/29/22 0319 05/29/22 0721  BP: (!) 147/68 (!) 170/68  Pulse: 91 83  Resp: 18 18  Temp: 98.8 F (37.1 C) 98 F (36.7 C)  SpO2: 100% 93%    Intake/Output Summary (Last 24 hours) at 05/29/2022 1144 Last data filed at 05/29/2022 0300 Gross per 24 hour  Intake 240 ml  Output 450 ml  Net -210 ml    Last Weight  Most recent update: 05/28/2022  3:50 AM    Weight  79.2 kg (174 lb 9.7 oz)            Gen:  Elderly Caucasian F in NAD HEENT: (+) white on tongue  CV: Irregular rate and rhythm PULM:  On 3LPM [place], breathing is even and nonlabored ABD: soft/nontender  EXT: No edema  Neuro: Alert and oriented x3   SUMMARY OF RECOMMENDATIONS   DNAR/DNI   MOST completed and placed in chart  Updates provided to patients daughter, [name] --> Expressed my concerns regarding patients lack of POs and introduced the idea of hospice    Ongoing support   Code Status/Advance Care Planning: DNAR/DNI   Symptom Management:  RLS: Pramipexole 0.135m PO BID   Adult FTT: - Dietician consult order  per notes on 12/28 - Offer supplements - 1:1 feeding  Generalized Weakness: - OOB Daily   Nausea: - Continue zofran 30 minutes before meals - Continue compazine as needed   Oral Thrush: - Nystatin QID  Billing based on MDM: High  ______________________________________________________________________________________ [facility]  Medicine Team Team Cell Phone: [phone] Please utilize secure chat with additional questions, if there is no response within 30 minutes please call the above phone number  Palliative Medicine Team providers are available by phone from 7am to 7pm daily and can be reached through the team cell phone.  Should this patient require assistance outside of these hours, please call the patient's  attending physician.

## 2022-05-29 NOTE — Plan of Care (Signed)
  Problem: Nutrition: Goal: Dietary intake will improve Outcome: Not Progressing   Problem: Health Behavior/Discharge Planning: Goal: Ability to manage health-related needs will improve Outcome: Not Progressing

## 2022-05-30 DIAGNOSIS — I611 Nontraumatic intracerebral hemorrhage in hemisphere, cortical: Secondary | ICD-10-CM | POA: Diagnosis not present

## 2022-05-30 LAB — GLUCOSE, CAPILLARY
Glucose-Capillary: 86 mg/dL (ref 70–99)
Glucose-Capillary: 77 mg/dL (ref 70–99)
Glucose-Capillary: 89 mg/dL (ref 70–99)
Glucose-Capillary: 97 mg/dL (ref 70–99)

## 2022-05-30 LAB — MAGNESIUM: Magnesium: 1.8 mg/dL (ref 1.7–2.4)

## 2022-05-30 LAB — RENAL FUNCTION PANEL
Albumin: 2 g/dL — ABNORMAL LOW (ref 3.5–5.0)
Anion gap: 16 — ABNORMAL HIGH (ref 5–15)
BUN: 27 mg/dL — ABNORMAL HIGH (ref 8–23)
CO2: 21 mmol/L — ABNORMAL LOW (ref 22–32)
Calcium: 8.4 mg/dL — ABNORMAL LOW (ref 8.9–10.3)
Chloride: 106 mmol/L (ref 98–111)
Creatinine, Ser: 2.08 mg/dL — ABNORMAL HIGH (ref 0.44–1.00)
GFR, Estimated: 24 mL/min — ABNORMAL LOW (ref >60)
Glucose, Bld: 97 mg/dL (ref 70–99)
Phosphorus: 3 mg/dL (ref 2.5–4.6)
Potassium: 3.7 mmol/L (ref 3.5–5.1)
Sodium: 143 mmol/L (ref 135–145)

## 2022-05-30 NOTE — Progress Notes (Signed)
    Durable Medical Equipment  (From admission, onward)           Start     Ordered   05/30/22 1415  For home use only DME lightweight manual wheelchair with seat cushion  Once       Comments: Patient suffers from weakness which impairs their ability to perform daily activities like bathing, dressing, grooming, and toileting in the home.  A cane or walker will not resolve  issue with performing activities of daily living. A wheelchair will allow patient to safely perform daily activities. Patient is not able to propel themselves in the home using a standard weight wheelchair due to general weakness. Patient can self propel in the lightweight wheelchair. Length of need Lifetime. Accessories: elevating leg rests (ELRs), wheel locks, extensions and anti-tippers.   05/30/22 1415

## 2022-05-30 NOTE — TOC Progression Note (Signed)
Transition of Care [facility]) - Progression Note    Patient Details  Name: Lauren Craig MRN: [id number] Date of Birth: [birth year]  [facility] CM/SW Contact  Zenon Mayo, RN Phone Number: 05/30/2022, 2:19 PM  Clinical Narrative:    NCM was notified by Staff RN that daughter would like to speak to this NCM.  NCM contacted daughter at [phone].  She states patient will need a light weight wheel chair, she does not have a preference of which agency that supplies it,  she also would like to have a [employer] brochure,  and she asked about pads to go on the beds.  This NCM informed her since patient does not have Medicaid she will need to get those from a Medical supply store out of pocket, she was very appreciative of the information.  This NCM left brochure for [employer] in patient room for the daughter to pick up.  NCM made referral for light weight wheelchair to [place] with [employer].  They will supply the w/chair.     Barriers to Discharge: Continued Medical Work up  Expected Discharge Plan and Services   Discharge Planning Services: CM Consult Post Acute Care Choice: [place] arrangements for the past 2 months: [place]: PT, OT, Speech Therapy HH Agency: [facility] Date [place]: 05/18/22 Time [place]: [place] Representative spoke with at [place]: [place] ([place]) Interventions [place]: No Food Insecurity (05/18/2022)  Recent Concern: [place] Present (03/21/2022)  Housing: Low Risk  (05/18/2022)  Transportation Needs: No Transportation Needs (05/18/2022)  Utilities: Not At Risk (05/18/2022)  Depression (PHQ2-9): Low Risk  (12/17/2021)  Financial Resource Strain: Medium Risk (03/21/2022)  Social Connections: Unknown (03/22/2021)  Stress: Stress Concern Present (02/15/2021)  Tobacco Use:  Medium Risk (05/15/2022)    Readmission Risk Interventions     No data to display

## 2022-05-30 NOTE — Discharge Summary (Incomplete)
Physician Discharge Summary  Lauren Craig VFI:[id number] DOB: 01-06-1946 DOA: 05/15/2022  PCP: Martyn Malay, MD  Admit date: 05/15/2022 Discharge date: 05/31/2022  Admitted From: Home Disposition:  Home  Recommendations for Outpatient Follow-up:  Follow up with PCP in 1-2 weeks Follow-up with neurology as scheduled Continue to follow-up with palliative care for ongoing discussion on goals of care  Home Health: PT OT nursing Equipment/Devices: Lightweight wheelchair with cushion  Discharge Condition: Stable CODE STATUS: Full Diet recommendation: Dysphagia 3 with thin liquids  Brief/Interim Summary: 77 y.o. female with history of multiple prior strokes, hypertension, arthritis, hyperlipidemia, osteomyelitis, PVD, diabetes, esophagitis and anxiety presenting after being found down at home with dysarthria and left-sided facial droop. She has a hemorrhage that is likely a transformation from an embolic stroke. She was supposed to be discharged yesterday but her creatinine is getting worse. Cr was around 2.1 when she was admitted. Creatinine worsened to 4.4 and TRH was consulted. Later patient was transferred to our service. Nephrology was also consulted, patient received IV fluid with slowly improving her renal function.   Patient admitted as above after being found down at home with left-sided facial droop and dysarthria, imaging notable for intracerebral hemorrhage, neurology admitting ultimately transferring to hospitalist service as patient improved.  Patient's AKI likely secondary to contrast mediated nephropathy improved, appreciate nephrology insight recommendations.  She is now back to baseline.  Patient continues to have some nausea which appears to be chronic in nature having difficulty taking medications which is left her blood pressure somewhat uncontrolled.  She is improving today with addition of Zofran scheduled with medications without further complication and her blood pressure  is not currently well-controlled.  Home medicine list changes as below.  Initially worked up for disposition to rehab however given her marked improvement family is requesting discharge home with home health PT OT and nursing which is certainly reasonable.  Close follow-up with PCP, neurology and GI as scheduled in the near future.   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 Patient was stabilized from neurology standpoint. In the past patient was on Plavix but now will be on aspirin only moving forward. Speech and swallow is recommending dysphagia 3 diet PT recommending HHPT Daughter able to provide supervision. EP cardiology following loop recorder to be replaced today   Acute kidney injury superimposed on CKD stage IIIb, improving Baseline creatinine 1.6-2.0.  Creatinine peaked at 4.6 on 12/23 Presumed to be contrast-induced AKI, resolving Continue to increase free water intake at home as directed   Chronic persistent nausea vomiting, poor appetite, poor p.o. intake, improved Dysphagia after stroke, resolving Abdominal bloating, improved - X-rays unremarkable - prior history of prior C. difficile colitis.  Per family this is chronic.  In the past patient has had extensive GI workup including gastric emptying study which was normal, endoscopy in 2022 showing severe esophagitis,  duodenitis, and gastritis. -CT A/P abdomen showed left-sided diverticulosis with diverticulitis,  unclear if related to symptoms given chronicity -Patient was initially placed on IV Rocephin and Flagyl but discontinued by GI on 12/26 - For now GI is recommending p.o. antacid, 4 weeks of oral Carafate, outpatient follow-up and colonoscopy with GI in 1 month.   Well controlled diabetes mellitus type 2 with complications ([place]) -Recent hemoglobin A1c 6.4, -Continue diabetic diet   HTN (hypertension) uncontrolled - Improving on amlodipine, carvedilol, clonidine patch   Hyperlipidemia - Continue Crestor, Zetia   PVD (peripheral vascular disease) (HCC) -Status post left fifth toe amputation -Outpatient follow-up with vascular as necessary, no current issues  Discharge Instructions  Discharge Instructions     Ambulatory referral to Neurology   Complete by: As directed    Follow-up with Dr. Leta Baptist in 4 weeks, patient is Dr. Gladstone Lighter patient.  Thank you      Allergies as of 05/31/2022       Reactions   Penicillins Hives, Rash, Other (See Comments)   Did it involve swelling of the face/tongue/throat, SOB, or low BP? Yes Did it involve sudden or severe rash/hives, skin peeling, or any reaction on the inside of your mouth or nose? No Did you need to seek medical attention at a hospital or doctor's office? Yes When did it last happen? ? If all above answers are "NO", may proceed with cephalosporin use.   Insulin Lispro Itching, Other (See Comments)   Site of injection was inflamed, red, painful episode   Insulin Lispro Prot & Lispro Other (See Comments)   Site of injection was inflamed, red, painful episode Humalog Mix   Semaglutide Nausea And Vomiting   Metformin Diarrhea        Medication List     STOP taking these medications    atorvastatin 80 MG tablet Commonly known as: LIPITOR   enalapril 20 MG tablet Commonly known as: VASOTEC       TAKE these medications     acetaminophen 325 MG tablet Commonly known as: TYLENOL Take 2 tablets (650 mg total) by mouth every 6 (six) hours as needed for headache.   albuterol 108 (90 Base) MCG/ACT inhaler Commonly known as: VENTOLIN HFA Inhale 2 puffs into the lungs every 6 (six) hours as needed for wheezing or shortness of breath.   amLODipine 10 MG tablet Commonly known as: NORVASC Take 1 tablet (10 mg total) by mouth daily.   aspirin EC 81 MG tablet Take 1 tablet (81 mg total) by mouth daily. Swallow whole. Start taking on: June 01, 2022   carvedilol 12.5 MG tablet Commonly known as: COREG Take 1 tablet (12.5 mg total) by mouth 2 (two) times daily with a meal. What changed:  medication strength how much to take   cloNIDine 0.1 mg/24hr patch Commonly known as: CATAPRES - Dosed in mg/24 hr Place 1 patch (0.1 mg total) onto the skin once a week. Start taking on: June 01, 2022   ezetimibe 10 MG tablet Commonly known as: Zetia Take 1 tablet (10 mg total) by mouth daily.   famotidine 10 MG tablet Commonly known as: PEPCID Take 1 tablet (10 mg total) by mouth daily.   fluticasone 50 MCG/ACT nasal spray Commonly known as: FLONASE Place 2 sprays into both nostrils daily.   nystatin 100000 UNIT/ML suspension Commonly known as: MYCOSTATIN Take 5 mLs (500,000 Units total) by mouth 4 (four) times daily.   ondansetron 4 MG disintegrating tablet Commonly known as: ZOFRAN-ODT Take 1 tablet (4 mg total) by mouth every 8 (eight) hours as needed  for nausea or vomiting.   OVER THE COUNTER MEDICATION Take 1 tablet by mouth daily. Prevagen - once daily   pantoprazole 40 MG tablet Commonly known as: PROTONIX Take 1 tablet (40 mg total) by mouth 2 (two) times daily, 30-60 minutes before breakfast and dinner.   rosuvastatin 40 MG tablet Commonly known as: CRESTOR Take 1 tablet (40 mg total) by mouth daily. Start taking on: June 01, 2022   sertraline 25 MG tablet Commonly known as:  ZOLOFT Take 1 tablet (25 mg total) by mouth daily.   sucralfate 1 GM/10ML suspension Commonly known as: CARAFATE Take 10 mLs (1 g total) by mouth 4 (four) times daily -  with meals and at bedtime.               Durable Medical Equipment  (From admission, onward)           Start     Ordered   05/30/22 1429  For home use only DME lightweight manual wheelchair with seat cushion  Once       Comments: PPatient suffers from weakness which impairs their ability to perform daily activities like bathing, dressing, grooming, and toileting in the home.  A cane or walker will not resolve  issue with performing activities of daily living. A wheelchair will allow patient to safely perform daily activities. Patient is not able to propel themselves in the home using a standard weight wheelchair due to general weakness. Patient can self propel in the lightweight wheelchair. Length of need Lifetime. Accessories: elevating leg rests (ELRs), wheel locks, extensions and anti-tippers.   05/30/22 1428   05/30/22 1415  For home use only DME lightweight manual wheelchair with seat cushion  Once       Comments: Patient suffers from weakness which impairs their ability to perform daily activities like bathing, dressing, grooming, and toileting in the home.  A cane or walker will not resolve  issue with performing activities of daily living. A wheelchair will allow patient to safely perform daily activities. Patient is not able to propel themselves in the home using a standard weight wheelchair due to general weakness. Patient can self propel in the lightweight wheelchair. Length of need Lifetime. Accessories: elevating leg rests (ELRs), wheel locks, extensions and anti-tippers.   05/30/22 1415            Follow-up Information     Penumalli, Earlean Polka, MD. Schedule an appointment as soon as possible for a visit in 1 month(s).   Specialties: Neurology, Radiology Contact information: [address] [place] [address] [phone]         [facility] Follow up.          [place]. Follow up.   Why: [facility]--the home health agency will contact you for the first home visit. Contact information: [address] [place] [address] [phone]                Allergies  Allergen Reactions   Penicillins Hives, Rash and Other (See Comments)    Did it involve swelling of the face/tongue/throat, SOB, or low BP? Yes Did it involve sudden or severe rash/hives, skin peeling, or any reaction on the inside of your mouth or nose? No Did you need to seek medical attention at a hospital or doctor's office? Yes When did it last happen? ? If all above answers are "NO", may proceed with cephalosporin use.    Insulin Lispro  Itching and Other (See Comments)    Site of injection was inflamed, red, painful episode   Insulin Lispro Prot & Lispro Other (See Comments)    Site of injection was inflamed, red, painful episode Humalog Mix   Semaglutide Nausea And Vomiting   Metformin Diarrhea    Consultations: Neurology, cardiology EP, nephrology  Procedures/Studies: DG Abd 1 View  Result Date: 05/26/2022 CLINICAL DATA:  Abdominal distention EXAM: ABDOMEN - 1 VIEW COMPARISON:  None Available. FINDINGS: Prior cholecystectomy. Nonobstructive bowel gas pattern. No organomegaly or free air. No suspicious calcification. Vascular calcifications noted. IMPRESSION: No acute findings. Electronically Signed   By: Rolm Baptise M.D.   On: 05/26/2022 18:08   CT ABDOMEN PELVIS WO CONTRAST  Result Date: 05/22/2022 CLINICAL DATA:  Abdominal pain with nausea vomiting and diarrhea. 12/17/2020 EXAM: CT ABDOMEN AND PELVIS WITHOUT CONTRAST TECHNIQUE: Multidetector CT imaging of the abdomen and pelvis was performed following the standard protocol without IV contrast. RADIATION DOSE REDUCTION: This exam was performed according to the departmental  dose-optimization program which includes automated exposure control, adjustment of the mA and/or kV according to patient size and/or use of iterative reconstruction technique. COMPARISON:  None Available. FINDINGS: Lower chest: Bibasilar dependent atelectasis with small bilateral pleural effusions. Hepatobiliary: No suspicious focal abnormality in the liver on this study without intravenous contrast. Gallbladder surgically absent. No intrahepatic or extrahepatic biliary dilation. Pancreas: No focal mass lesion. No dilatation of the main duct. No intraparenchymal cyst. No peripancreatic edema. Spleen: No splenomegaly. No focal mass lesion. Adrenals/Urinary Tract: No adrenal nodule or mass. 17 mm water density lesion lower pole right kidney is compatible with a cyst, stable since 10/28/2020. No followup imaging is recommended. Both kidney show heterogeneous parenchymal attenuation, nonspecific but pyelonephritis could have this appearance. No evidence for hydroureter. Bladder is moderately distended. Stomach/Bowel: Stomach is unremarkable. No gastric wall thickening. No evidence of outlet obstruction. Duodenum is normally positioned as is the ligament of Treitz. No small bowel wall thickening. No small bowel dilatation. The terminal ileum is normal. The appendix is normal. No gross colonic mass. No colonic wall thickening. Left-sided diverticulosis evident. Subtle pericolonic stranding along the descending colon (51/3) raises the question of diverticulitis. Vascular/Lymphatic: There is advanced atherosclerotic calcification of the abdominal aorta without aneurysm. There is no gastrohepatic or hepatoduodenal ligament lymphadenopathy. No retroperitoneal or mesenteric lymphadenopathy. No pelvic sidewall lymphadenopathy. Reproductive: Hysterectomy.  There is no adnexal mass. Other: No intraperitoneal free fluid. Musculoskeletal: Bones are diffusely demineralized. Degenerative changes noted in both hips. IMPRESSION: 1.  Left-sided diverticulosis with subtle pericolonic stranding along the descending colon. Imaging features raise the question of diverticulitis. 2. Both kidneys show heterogeneous parenchymal attenuation, nonspecific but pyelonephritis could have this appearance. 3. Bibasilar dependent atelectasis with small bilateral pleural effusions. 4.  Aortic Atherosclerosis (ICD10-I70.0). Electronically Signed   By: Misty Stanley M.D.   On: 05/22/2022 12:22   EEG adult  Result Date: 05/16/2022 Lora Havens, MD     05/16/2022  3:57 PM Patient Name: Lauren Craig MRN: [id number] Epilepsy Attending: Lora Havens Referring Physician/Provider: Katy Apo, NP Date: 05/16/2022 Duration: 21.46 mins Patient history: 77 y.o. 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 She presents to the ED c/o slurred speech and weakness. CT shows a trace subarachnoid hemorrhage. EEG to evaluate for seizure. Level of alertness: Awake AEDs during EEG study: None Technical aspects: This EEG study was done with scalp electrodes  positioned according to the 10-20 International system of electrode placement. Electrical activity was reviewed with band pass filter of 1-70Hz , sensitivity of 7 uV/mm, display speed of 34mm/sec with a 60Hz  notched filter applied as appropriate. EEG data were recorded continuously and digitally stored.  Video monitoring was available and reviewed as appropriate. Description: The posterior dominant rhythm consists of 7.5 Hz activity of moderate voltage (25-35 uV) seen predominantly in posterior head regions, asymmetric (right <left) and reactive to eye opening and eye closing.  EEG showed continuous 3 to 5 Hz 11 delta slowing in right hemisphere as well as intermittent generalized 3 to 6 Hz theta-delta slowing. Hyperventilation and photic stimulation were not performed.   ABNORMALITY - Continuous slow,  right hemisphere - Intermittent slow, generalized - Background asymmetry, right< left IMPRESSION: This study is suggestive of cortical dysfunction in right hemisphere likely secondary to underlying structural and normality. Additionally there is mild diffuse encephalopathy, nonspecific etiology. No seizures or epileptiform discharges were seen throughout the recording. Lora Havens   DG Swallowing Func-Speech Pathology  Result Date: 05/16/2022 Table formatting from the original result was not included. Objective Swallowing Evaluation: Type of Study: MBS-Modified Barium Swallow Study  Patient Details Name: Lauren Craig MRN: [id number] Date of Birth: [birth year] Today's Date: 05/16/2022 Time: SLP Start Time (ACUTE ONLY): 58 -SLP Stop Time (ACUTE ONLY): 8938 SLP Time Calculation (min) (ACUTE ONLY): 12 min Past Medical History: Past Medical History: Diagnosis Date  Anxiety disorder   Arthritis   hands, hips  C. 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  Vitamin B12 deficiency  Past Surgical History: Past Surgical History: Procedure Laterality Date  ABDOMINAL AORTOGRAM W/LOWER EXTREMITY N/A 08/11/2017  Procedure: ABDOMINAL AORTOGRAM W/LOWER EXTREMITY;  Surgeon: Angelia Mould, MD;  Location: [facility];  Service: Cardiovascular;  Laterality: N/A;  ABDOMINAL HYSTERECTOMY    AMPUTATION Left 11/14/2019  Procedure: Left Fifth ray amputation;  Surgeon: Wylene Simmer, MD;  Location: [facility];  Service: Orthopedics;  Laterality: Left;  AMPUTATION TOE Left 05/17/2018  Procedure: 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;  Surgeon: Wylene Simmer, MD;  Location: [facility];  Service: Orthopedics;  Laterality: Left;  BIOPSY  10/29/2020  Procedure: BIOPSY;  Surgeon: Irene Shipper, MD;  Location: [facility];  Service: Endoscopy;;  ESOPHAGOGASTRODUODENOSCOPY (EGD) WITH PROPOFOL N/A 10/29/2020  Procedure: ESOPHAGOGASTRODUODENOSCOPY (EGD) WITH PROPOFOL;  Surgeon: Irene Shipper, MD;  Location: [facility];  Service: Endoscopy;  Laterality: N/A;  PARTIAL HYMENECTOMY  1968  TONSILLECTOMY AND ADENOIDECTOMY    VAGINAL HYSTERECTOMY  1987 HPI: Pt is a 77 yo female presenting with slurred speech and weakness after unwitnessed fall. CT shows a trace SAH at the R vertex. MRI shows stable SAH at the R vertex with mild DWI infarct there as well as strong evidence of superimposed scattered small acute R MCA territory infarcts, including corona radiata and anterior insula. PMH includes: multiple prior strokes (11/2020 R ACA infarcts; evaluated by SLP and found to have moderate memory deficits and unilateral UMN dysarthria, MBS with silent aspiration x1 with thin liquids only while swallowing pill), esophagitis, anxiety, HTN, arthritis, HLD, osteomyelitis, PVD, DM type 2  Subjective: says her speech is the biggest thing that is different  Recommendations for follow up therapy are  one component of a multi-disciplinary discharge planning process, led by the attending physician.  Recommendations may be updated based on patient status, additional functional criteria and insurance authorization. Assessment / Plan / Recommendation   05/16/2022   1:00 PM Clinical Impressions Clinical Impression Pt has mild oropharyngeal dysphagia noted during this MBS, although with limited ability to challenge her fully. Despite cues to take large, consecutive boluses, she takes very small and single bites/sips at a time due to nausea. When prompted to take as big of a sip as possible, she does start to have penetration of thin liquids due to impaired timing and coordination. Penetrates clear spontaneously (PAS 2), although there may still be concern for aspiration correlated with coughing events noted at bedside when pt was  drinking more impulsively. [name] oral phase is slow but also without the anterior loss observed with larger bites/sips at bedside. Recommend starting with Dys 3 diet and thin liquids, avoiding mixed consistencies. Would encourage smaller sips as well as performance on MBS suggests adequate function to handle at least smaller volumes. SLP Visit Diagnosis Dysphagia, oropharyngeal phase (R13.12) Impact on safety and function Mild aspiration risk     05/16/2022   1:00 PM Treatment Recommendations Treatment Recommendations Therapy as outlined in treatment plan below     05/16/2022   1:00 PM Prognosis Prognosis for Safe Diet Advancement Good   05/16/2022   1:00 PM Diet Recommendations SLP Diet Recommendations Dysphagia 3 (Mech soft) solids;Thin liquid;Other (Comment) Liquid Administration via Cup;Straw Medication Administration Whole meds with puree Compensations Slow rate;Small sips/bites Postural Changes Seated upright at 90 degrees     05/16/2022   1:00 PM Other Recommendations Oral Care Recommendations Oral care BID Follow Up Recommendations Acute inpatient rehab (3hours/day) Functional Status Assessment Patient has had a recent decline in their functional status and demonstrates the ability to make significant improvements in function in a reasonable and predictable amount of time.   05/16/2022   1:00 PM Frequency and Duration  Speech Therapy Frequency (ACUTE ONLY) min 2x/week Treatment Duration 2 weeks     05/16/2022   1:00 PM Oral Phase Oral Phase Impaired Oral - Thin Teaspoon Reduced posterior propulsion Oral - Thin Cup Reduced posterior propulsion Oral - Thin Straw Reduced posterior propulsion Oral - Puree Reduced posterior propulsion Oral - Regular Reduced posterior propulsion    05/16/2022   1:00 PM Pharyngeal Phase Pharyngeal Phase Impaired Pharyngeal- Thin Teaspoon Delayed swallow initiation-pyriform sinuses Pharyngeal- Thin Cup Delayed swallow initiation-pyriform sinuses;Penetration/Aspiration before swallow  Pharyngeal Material enters airway, remains ABOVE vocal cords then ejected out Pharyngeal- Thin Straw Pharyngeal residue - valleculae;Pharyngeal residue - pyriform;Delayed swallow initiation-pyriform sinuses;Penetration/Aspiration before swallow Pharyngeal Material enters airway, remains ABOVE vocal cords then ejected out Pharyngeal- Puree Delayed swallow initiation-vallecula Pharyngeal- Regular Delayed swallow initiation-vallecula    05/16/2022   1:00 PM Cervical Esophageal Phase  Cervical Esophageal Phase [facility] Osie Bond., M.A. [place] Office [phone] Secure chat preferred 05/16/2022, 1:55 PM                     ECHOCARDIOGRAM COMPLETE  Result Date: 05/16/2022    ECHOCARDIOGRAM REPORT   Patient Name:   Lauren Craig Date of Exam: 05/16/2022 Medical Rec #:  [id number]      Height:       64.0 in Accession #:    [id number]     Weight:       159.4 lb Date of Birth:  [birth year]      BSA:  1.776 m Patient Age:    36 years       BP:           140/85 mmHg Patient Gender: F              HR:           72 bpm. Exam Location:  Inpatient Procedure: 2D Echo Indications:    stroke  History:        Patient has prior history of Echocardiogram examinations, most                 recent 12/19/2020. Risk Factors:Hypertension, Diabetes and                 Dyslipidemia.  Sonographer:    Harvie Junior Referring Phys: [id number] Otelia Santee  Sonographer Comments: Technically difficult study due to poor echo windows, suboptimal subcostal window and suboptimal apical window. Image acquisition challenging due to respiratory motion. IMPRESSIONS  1. Left ventricular ejection fraction, by estimation, is 55 to 60%. The left ventricle has normal function. The left ventricle has no regional wall motion abnormalities. There is mild asymmetric left ventricular hypertrophy of the basal-septal segment. Left ventricular diastolic parameters are consistent with Grade II diastolic dysfunction (pseudonormalization).   2. Right ventricular systolic function is normal. The right ventricular size is normal. There is normal pulmonary artery systolic pressure. The estimated right ventricular systolic pressure is 95.0 mmHg.  3. Left atrial size was mildly dilated.  4. The mitral valve is degenerative. Trivial mitral valve regurgitation. No evidence of mitral stenosis.  5. The aortic valve is tricuspid. Aortic valve regurgitation is not visualized. Aortic valve sclerosis/calcification is present, without any evidence of aortic stenosis.  6. The inferior vena cava is normal in size with greater than 50% respiratory variability, suggesting right atrial pressure of 3 mmHg. Comparison(s): No significant change from prior study. Conclusion(s)/Recommendation(s): No intracardiac source of embolism detected on this transthoracic study. Consider a transesophageal echocardiogram to exclude cardiac source of embolism if clinically indicated. FINDINGS  Left Ventricle: Left ventricular ejection fraction, by estimation, is 55 to 60%. The left ventricle has normal function. The left ventricle has no regional wall motion abnormalities. The left ventricular internal cavity size was normal in size. There is  mild asymmetric left ventricular hypertrophy of the basal-septal segment. Left ventricular diastolic parameters are consistent with Grade II diastolic dysfunction (pseudonormalization). Right Ventricle: The right ventricular size is normal. No increase in right ventricular wall thickness. Right ventricular systolic function is normal. There is normal pulmonary artery systolic pressure. The tricuspid regurgitant velocity is 1.70 m/s, and  with an assumed right atrial pressure of 3 mmHg, the estimated right ventricular systolic pressure is 93.2 mmHg. Left Atrium: Left atrial size was mildly dilated. Right Atrium: Right atrial size was normal in size. Pericardium: Trivial pericardial effusion is present. Presence of epicardial fat layer. Mitral Valve:  The mitral valve is degenerative in appearance. Mild to moderate mitral annular calcification. Trivial mitral valve regurgitation. No evidence of mitral valve stenosis. Tricuspid Valve: The tricuspid valve is grossly normal. Tricuspid valve regurgitation is trivial. No evidence of tricuspid stenosis. Aortic Valve: The aortic valve is tricuspid. Aortic valve regurgitation is not visualized. Aortic valve sclerosis/calcification is present, without any evidence of aortic stenosis. Aortic valve mean gradient measures 8.4 mmHg. Aortic valve peak gradient measures 15.7 mmHg. Aortic valve area, by VTI measures 2.08 cm. Pulmonic Valve: The pulmonic valve was grossly normal. Pulmonic valve regurgitation is not visualized. No evidence of  pulmonic stenosis. Aorta: The aortic root and ascending aorta are structurally normal, with no evidence of dilitation. Venous: The inferior vena cava is normal in size with greater than 50% respiratory variability, suggesting right atrial pressure of 3 mmHg. IAS/Shunts: The atrial septum is grossly normal.  LEFT VENTRICLE PLAX 2D LVIDd:         3.70 cm      Diastology LVIDs:         2.60 cm      LV e' medial:    5.59 cm/s LV PW:         1.00 cm      LV E/e' medial:  19.3 LV IVS:        1.20 cm      LV e' lateral:   7.62 cm/s LVOT diam:     2.10 cm      LV E/e' lateral: 14.2 LV SV:         80 LV SV Index:   45 LVOT Area:     3.46 cm  LV Volumes (MOD) LV vol d, MOD A2C: 60.2 ml LV vol d, MOD A4C: 113.0 ml LV vol s, MOD A2C: 19.4 ml LV vol s, MOD A4C: 46.8 ml LV SV MOD A2C:     40.8 ml LV SV MOD A4C:     113.0 ml LV SV MOD BP:      59.7 ml RIGHT VENTRICLE RV S prime:     16.80 cm/s TAPSE (M-mode): 1.8 cm LEFT ATRIUM           Index LA diam:      3.40 cm 1.91 cm/m LA Vol (A4C): 90.9 ml 51.17 ml/m  AORTIC VALVE                     PULMONIC VALVE AV Area (Vmax):    1.58 cm      PV Vmax:       1.18 m/s AV Area (Vmean):   1.69 cm      PV Peak grad:  5.6 mmHg AV Area (VTI):     2.08 cm AV Vmax:            198.00 cm/s AV Vmean:          133.400 cm/s AV VTI:            0.387 m AV Peak Grad:      15.7 mmHg AV Mean Grad:      8.4 mmHg LVOT Vmax:         90.50 cm/s LVOT Vmean:        64.900 cm/s LVOT VTI:          0.232 m LVOT/AV VTI ratio: 0.60  AORTA Ao Root diam: 3.10 cm Ao Asc diam:  2.90 cm MITRAL VALVE                TRICUSPID VALVE MV Area (PHT): 3.72 cm     TR Peak grad:   11.6 mmHg MV Decel Time: 204 msec     TR Vmax:        170.00 cm/s MV E velocity: 108.00 cm/s MV A velocity: 122.00 cm/s  SHUNTS MV E/A ratio:  0.89         Systemic VTI:  0.23 m                             Systemic Diam: 2.10 cm Eleonore Chiquito MD Electronically  signed by Eleonore Chiquito MD Signature Date/Time: 05/16/2022/11:52:30 AM    Final    MR BRAIN WO CONTRAST  Result Date: 05/16/2022 CLINICAL DATA:  77 year old female neurologic deficit presenting yesterday after fall. Trace vertex subarachnoid hemorrhage. Atherosclerotic stenoses on CTA. EXAM: MRI HEAD WITHOUT CONTRAST TECHNIQUE: Multiplanar, multiecho pulse sequences of the brain and surrounding structures were obtained without intravenous contrast. COMPARISON:  CT head, CTA head and neck yesterday. Brain MRI 12/18/2020. FINDINGS: Brain: Scattered small areas of restricted diffusion in the right MCA territory, including pre motor cortex, right corona radiata (series 2, image 36) and anterior insula. Abnormal diffusion at the right superior motor strip/central sulcus is probably artifact from known subarachnoid hemorrhage there (see FLAIR series 7, image 22). No intraventricular or other extra-axial hemorrhage identified. No contralateral left hemisphere or posterior fossa restricted diffusion. Mild cytotoxic edema at the acutely affected areas. No mass effect. 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 Superimposed chronic brainstem microhemorrhages. Stable overall cerebral volume from last  year. No midline shift, mass effect, evidence of mass lesion, ventriculomegaly. Cervicomedullary junction and pituitary are within normal limits. Vascular: Major intracranial vascular flow voids appear stable from last year. Skull and upper cervical spine: Visible cervical spine appears stable, negative for age. Visualized bone marrow signal is within normal limits. Sinuses/Orbits: Stable. Other: Mild chronic mastoid effusions appear stable. Negative visible nasopharynx. IMPRESSION: 1. Stable trace SAH at the right vertex with mild DWI artifact there. 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 No hemorrhagic transformation or mass effect. 2. Underlying advanced chronic small vessel disease. Electronically Signed   By: Genevie Ann M.D.   On: 05/16/2022 09:06   CT ANGIO HEAD NECK W WO CM (CODE STROKE)  Result Date: 05/15/2022 CLINICAL DATA:  Neuro deficit, acute, stroke suspected. Slurred speech and left-sided facial droop. Patient fell last night and was down for proximally 9 hours. EXAM: CT ANGIOGRAPHY HEAD AND NECK TECHNIQUE: Multidetector CT imaging of the head and neck was performed using the standard protocol during bolus administration of intravenous contrast. Multiplanar CT image reconstructions and MIPs were obtained to evaluate the vascular anatomy. Carotid stenosis measurements (when applicable) are obtained utilizing NASCET criteria, using the distal internal carotid diameter as the denominator. RADIATION DOSE REDUCTION: This exam was performed according to the departmental dose-optimization program which includes automated exposure control, adjustment of the mA and/or kV according to patient size and/or use of iterative reconstruction technique. CONTRAST:  60mL OMNIPAQUE IOHEXOL 350 MG/ML SOLN COMPARISON:  CT head without contrast 05/15/2022 FINDINGS: CTA NECK FINDINGS Aortic arch: Atherosclerotic calcifications  are present at the origins the great vessels without focal stenosis or aneurysm. The left vertebral artery originates directly from the arch, a normal variant. Right carotid system: The right common carotid artery is within normal limits. Calcifications are present the bifurcation without a significant stenosis. Cervical right ICA is within normal limits. Moderate patient motion is present just above the level of the scratched at moderate patient motion is present in the neck. Left carotid system: The left common carotid artery is within normal limits. Atherosclerotic changes are present at the bifurcation without a significant stenosis. The cervical left ICA is otherwise within normal limits. Vertebral arteries: The right vertebral artery is noted from the subclavian artery. A high-grade near occlusive stenosis is present at the origin of the right vertebral artery. The left vertebral artery is dominant. No other  focal stenoses are present in either vertebral artery in the neck. Skeleton: Vertebral body heights are normal. 3 mm degenerative anterolisthesis present C4-5. Straightening of the normal cervical lordosis is present. No focal osseous lesions are present. Other neck: Soft tissues the neck are otherwise unremarkable. Salivary glands are within normal limits. Thyroid is normal. No significant adenopathy is present. No focal mucosal or submucosal lesions are present. Upper chest: Lung apices are clear. The thoracic inlet is within normal limits. Review of the MIP images confirms the above findings CTA HEAD FINDINGS Anterior circulation: Atherosclerotic calcifications are present within the cavernous internal carotid arteries bilaterally. A 50% stenosis is present within the cavernous right ICA. No other significant stenosis is present relative to the more distal vessel. The ICA termini are within normal limits bilaterally. The A1 and M1 segments are normal. The anterior communicating artery is patent. Mild  narrowing is again seen within the distal M1 segments bilaterally. MCA bifurcations are intact. Segmental narrowing is again seen throughout ACA and MCA branch vessels without a significant proximal stenosis or occlusion. Posterior circulation: The left vertebral artery is the dominant vessel. A high-grade stenosis is present distal right V4 segment. Vertebrobasilar junction and basilar artery normal. Left posterior cerebral artery originates from basilar tip. The right posterior cerebral artery is of fetal type. Moderate P2 segment stenoses are present bilaterally, as previously seen. Venous sinuses: The dural sinuses are patent. The straight sinus and deep cerebral veins are intact. Cortical veins are within normal limits. No significant vascular malformation is evident. Anatomic variants: Fetal type right posterior cerebral artery. Review of the MIP images confirms the above findings IMPRESSION: 1. No emergent large vessel occlusion. 2. 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 3. 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 4. Moderate P2 segment stenoses bilaterally, as previously seen. 5. Mild narrowing of the distal M1 segments bilaterally. 6. Segmental narrowing branch vessels of the circle-of-Willis without a proximal stenosis or occlusion. Findings are similar the prior MRI, consistent with diffuse intracranial atherosclerosis. 7. Atherosclerotic changes at the carotid bifurcations and cavernous internal carotid arteries bilaterally without significant stenosis. 8.  Aortic Atherosclerosis (ICD10-I70.0). Electronically Signed   By: San Morelle M.D.   On: 05/15/2022 13:11   CT HEAD CODE STROKE WO CONTRAST  Result Date: 05/15/2022 CLINICAL DATA:  Code stroke. 77 year old female. History of chronic infarcts. Intracranial atherosclerosis. 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 Presenting with slurred speech, left-sided facial  droop. Fell last night, was down for about 9 hours. EXAM: CT HEAD WITHOUT CONTRAST TECHNIQUE: Contiguous axial images were obtained from the base of the skull through the vertex without intravenous contrast. RADIATION DOSE REDUCTION: This exam was performed according to the departmental dose-optimization program which includes automated exposure control, adjustment of the mA and/or kV according to patient size and/or use of iterative reconstruction technique. COMPARISON:  Head CT and brain MRI 12/17/2020, and earlier. FINDINGS: Brain: Small volume subarachnoid hemorrhage at the right vertex, single central or precentral sulcus involvement on series 2, image 25. No intraventricular hemorrhage. No ventriculomegaly. Stable cerebral volume. No midline shift, mass effect, or evidence of intracranial mass lesion. Patchy and confluent bilateral cerebral white matter hypodensity. Chronic appearing encephalomalacia in the left corona radiata and lentiform. No cortically based acute infarct identified. Vascular: Extensive Calcified atherosclerosis at the skull base. No suspicious intracranial vascular hyperdensity. Skull: No acute osseous abnormality identified. Sinuses/Orbits: Visualized paranasal sinuses and mastoids are stable and well aerated.  Other: No orbit or scalp soft tissue injury identified. ASPECTS [facility] Stroke Program Early CT Score) Total score (0-10 with 10 being normal): 10, although also positive for trace subarachnoid hemorrhage. IMPRESSION: 1. Trace subarachnoid hemorrhage at the right vertex (single sulcus involvement). This is likely posttraumatic given history of fall and found down. 2. No superimposed acute cortically based infarct identified. 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 3. These results were communicated to Dr. Cheral Marker at 12:40 pm on 05/15/2022 by text page via the [facility] messaging system. Electronically Signed   By: Genevie Ann M.D.   On: 05/15/2022 12:41     Subjective: No  acute issues or events overnight   Discharge Exam: Vitals:   05/31/22 0732 05/31/22 1115  BP: (!) 185/69 122/66  Pulse: 85 (!) 56  Resp: 20 15  Temp: 98.6 F (37 C) 98.2 F (36.8 C)  SpO2: 93% 95%   Vitals:   05/31/22 0435 05/31/22 0457 05/31/22 0732 05/31/22 1115  BP: (!) 180/61 (!) 157/60 (!) 185/69 122/66  Pulse: 82 78 85 (!) 56  Resp: 17 18 20 15   Temp: 98.6 F (37 C)  98.6 F (37 C) 98.2 F (36.8 C)  TempSrc: Oral  Oral Oral  SpO2: 92% 93% 93% 95%  Weight:      Height:        General: Pt is alert, awake, not in acute distress Cardiovascular: RRR, S1/S2 +, no rubs, no gallops Respiratory: CTA bilaterally, no wheezing, no rhonchi Abdominal: Soft, NT, ND, bowel sounds + Extremities: no edema, no cyanosis   The results of significant diagnostics from this hospitalization (including imaging, microbiology, ancillary and laboratory) are listed below for reference.     Microbiology: Recent Results (from the past 240 hour(s))  Urine Culture     Status: Abnormal   Collection Time: 05/21/22  7:50 PM   Specimen: Urine, Clean Catch  Result Value Ref Range Status   Specimen Description URINE, CLEAN CATCH  Final   Special Requests NONE  Final   Culture (A)  Final    <10,000 COLONIES/mL INSIGNIFICANT GROWTH Performed at [facility], [address], [place], [place] [address]    Report Status 05/22/2022 FINAL  Final     Labs: BNP (last 3 results) No results for input(s): "BNP" in the last 8760 hours. Basic Metabolic Panel: Recent Labs  Lab 05/26/22 0227 05/27/22 0339 05/28/22 0244 05/29/22 0219 05/29/22 0220 05/30/22 0339 05/30/22 0340 05/31/22 0353  NA 142 141 146*  --  144 143  --  143  K 3.4* 3.9 3.5  --  3.6 3.7  --  3.8  CL 103 107 109  --  110 106  --  110  CO2 26 24 27   --  20* 21*  --  21*  GLUCOSE 110* 129* 125*  --  93 97  --  97  BUN 35* 33* 26*  --  27* 27*  --  23  CREATININE 2.43* 2.23* 2.10*  --  2.07* 2.08*  --  1.92*  CALCIUM 8.5*  8.5* 8.5*  --  8.2* 8.4*  --  8.2*  MG  --  1.8 1.8 1.7  --   --  1.8 1.6*  PHOS 3.5 2.6  --   --  3.2 3.0  --  2.9   Liver Function Tests: Recent Labs  Lab 05/26/22 0227 05/27/22 0339 05/29/22 0220 05/30/22 0339 05/31/22 0353  ALBUMIN 2.2* 2.3* 1.9* 2.0* 2.1*   Recent Labs  Lab  05/25/22 0039  LIPASE 27   No results for input(s): "AMMONIA" in the last 168 hours. CBC: Recent Labs  Lab 05/25/22 0039  WBC 10.0  NEUTROABS 8.1*  HGB 9.3*  HCT 28.8*  MCV 82.1  PLT 281   Cardiac Enzymes: No results for input(s): "CKTOTAL", "CKMB", "CKMBINDEX", "TROPONINI" in the last 168 hours. BNP: Invalid input(s): "POCBNP" CBG: Recent Labs  Lab 05/30/22 1220 05/30/22 1621 05/30/22 2102 05/31/22 0626 05/31/22 1118  GLUCAP 77 89 97 119* 101*   D-Dimer No results for input(s): "DDIMER" in the last 72 hours. Hgb A1c No results for input(s): "HGBA1C" in the last 72 hours. Lipid Profile No results for input(s): "CHOL", "HDL", "LDLCALC", "TRIG", "CHOLHDL", "LDLDIRECT" in the last 72 hours. Thyroid function studies No results for input(s): "TSH", "T4TOTAL", "T3FREE", "THYROIDAB" in the last 72 hours.  Invalid input(s): "FREET3" Anemia work up No results for input(s): "VITAMINB12", "FOLATE", "FERRITIN", "TIBC", "IRON", "RETICCTPCT" in the last 72 hours. Urinalysis    Component Value Date/Time   COLORURINE YELLOW 05/21/2022 1950   APPEARANCEUR CLEAR 05/21/2022 1950   LABSPEC 1.012 05/21/2022 1950   PHURINE 5.0 05/21/2022 1950   GLUCOSEU 50 (A) 05/21/2022 1950   HGBUR NEGATIVE 05/21/2022 1950   BILIRUBINUR NEGATIVE 05/21/2022 1950   KETONESUR NEGATIVE 05/21/2022 1950   PROTEINUR 100 (A) 05/21/2022 1950   NITRITE NEGATIVE 05/21/2022 1950   LEUKOCYTESUR NEGATIVE 05/21/2022 1950   Sepsis Labs Recent Labs  Lab 05/25/22 0039  WBC 10.0   Microbiology Recent Results (from the past 240 hour(s))  Urine Culture     Status: Abnormal   Collection Time: 05/21/22  7:50 PM    Specimen: Urine, Clean Catch  Result Value Ref Range Status   Specimen Description URINE, CLEAN CATCH  Final   Special Requests NONE  Final   Culture (A)  Final    <10,000 COLONIES/mL INSIGNIFICANT GROWTH Performed at [facility], [place] [address], [place], [place] [address]    Report Status 05/22/2022 FINAL  Final     Time coordinating discharge: Over 30 minutes  SIGNED:   Little Ishikawa, DO [facility] 05/31/2022, 2:18 PM Pager   If 7PM-7AM, please contact night-coverage [email or web address]

## 2022-05-30 NOTE — Plan of Care (Signed)
  Problem: Nutrition: Goal: Dietary intake will improve Outcome: Not Progressing   Problem: Health Behavior/Discharge Planning: Goal: Ability to manage health-related needs will improve Outcome: Not Progressing   Problem: Nutritional: Goal: Maintenance of adequate nutrition will improve Outcome: Not Progressing   Problem: Skin Integrity: Goal: Risk for impaired skin integrity will decrease Outcome: Not Progressing   Problem: Pain Managment: Goal: General experience of comfort will improve Outcome: Not Progressing

## 2022-05-30 NOTE — Progress Notes (Signed)
PROGRESS NOTE    Lauren Craig  GEX:[id number] DOB: 1946-02-07 DOA: 05/15/2022 PCP: Martyn Malay, MD   Brief Narrative:  77 y.o. female with history of multiple prior strokes, hypertension, arthritis, hyperlipidemia, osteomyelitis, PVD, diabetes, esophagitis and anxiety presenting after being found down at home with dysarthria and left-sided facial droop. She has a hemorrhage that is likely a transformation from an embolic stroke. She was supposed to be discharged yesterday but her creatinine is getting worse. Cr was around 2.1 when she was admitted. Creatinine worsened to 4.4 and TRH was consulted.  Later patient was transferred to our service.  Nephrology was also consulted, patient received IV fluid with slowly improving her renal function.  Still has very poor oral intake, slowly improving her appetite.  She has been seen by GI due to persistent nausea and vomiting and recommended to place her on PPI twice daily for total of [redacted] weeks along with Carafate and outpatient follow-up.  Assessment & Plan:  Principal Problem:   ICH (intracerebral hemorrhage) (HCC) Active Problems:   Controlled diabetes mellitus type 2 with complications ([place])   Acute renal failure superimposed on stage 3b chronic kidney disease (HCC)   HTN (hypertension) Difficult to Control with Isolated Systolic HTN    Hyperlipidemia   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)   PVD (peripheral vascular disease) ([place])   Cryptogenic stroke ([place])   Malnutrition of moderate degree   ICH (intracerebral hemorrhage) ([place]) With prior history of CVA, presented with strokelike symptoms on 12/17 and was found to have [place]. Patient was stabilized from neurology standpoint. In the past patient was on Plavix but now will be on aspirin only moving forward. Speech and swallow is recommending dysphagia 3 diet PT recommending HHPT Daughter able to provide supervision. EP cardiology will plan to  place loop recorder prior to discharge, they will follow-up later this week after the holiday  Acute kidney injury superimposed on CKD stage IIIb, improving Baseline creatinine 1.6-2.0.  Creatinine peaked at 4.6 on 12/23, nephrology is following. IV fluids.  Suspect contrast-induced nephropathy. Creatinine improving   Persistent nausea vomiting, poor appetite, poor p.o. intake, improved Dysphagia after stroke Abdominal bloating, improved - X-rays unremarkable - prior history of prior C. difficile colitis.  Per family this is chronic.  In the past patient has had extensive GI workup including gastric emptying study which was normal, endoscopy in 2022 showing severe esophagitis, duodenitis, and gastritis. -CT A/P abdomen showed left-sided diverticulosis with diverticulitis, ?  Pyelonephritis.  UA negative for UTI -Patient was placed on IV Rocephin and Flagyl but discontinued by GI on 12/26 For now GI is recommending-IV PPI twice daily, transition to p.o. when able, 4 weeks of oral Carafate, outpatient follow-up and colonoscopy with GI in 1 month. Seen by palliative care, MOST form filled out    Well controlled diabetes mellitus type 2 with complications () -Recent hemoglobin A1c 6.4, Sliding scale and Accu-Cheks, sensitive   HTN (hypertension) uncontrolled -Add back home amlodipine - Continue clonidine 0.1 patch, coreg 12.5 bid, PRN IV meds ongoing.   Hyperlipidemia -Continue Crestor, Zetia   PVD (peripheral vascular disease) ([place]) -Status post left fifth toe amputation  DVT prophylaxis: SCD's Start: 05/15/22 1253 Code Status: FULL CODE **As of 05/29/22 at 5pm per discussion with patient, daughter, and staff Family Communication: Daughter updated at bedside  Status is: Inpatient Currently improved PO intake stable to discharge home with [facility] PT/OT/RN. Daughter is able to provide supervision  Subjective: Seen  and examined at bedside.  No acute issues/events  overnight.  Examination: Constitutional: Not in acute distress Respiratory: Clear to auscultation bilaterally Cardiovascular: Normal sinus rhythm, no rubs Abdomen: Nontender nondistended good bowel sounds Musculoskeletal: No edema noted Skin: No rashes seen Neurologic: CN 2-12 grossly intact.  Dysarthric/delayed speech Psychiatric: Alert and oriented x 3. Normal mood/affect  Objective: Vitals:   05/29/22 1300 05/29/22 1600 05/29/22 1955 05/30/22 0342  BP: (!) 186/69 (!) 171/65 (!) 177/66 (!) 188/67  Pulse: 71 87 80 82  Resp: 18 18 19 17   Temp: 97.9 F (36.6 C) 97.9 F (36.6 C) 97.8 F (36.6 C) 97.9 F (36.6 C)  TempSrc: Oral  Oral Oral  SpO2: 99% 95% 92% 95%  Weight:      Height:        Intake/Output Summary (Last 24 hours) at 05/30/2022 0731 Last data filed at 05/30/2022 0400 Gross per 24 hour  Intake 300 ml  Output 675 ml  Net -375 ml    Filed Weights   05/18/22 1958 05/28/22 0348  Weight: 76 kg 79.2 kg     Data Reviewed:   CBC: Recent Labs  Lab 05/25/22 0039  WBC 10.0  NEUTROABS 8.1*  HGB 9.3*  HCT 28.8*  MCV 82.1  PLT 557    Basic Metabolic Panel: Recent Labs  Lab 05/25/22 0039 05/26/22 0227 05/27/22 0339 05/28/22 0244 05/29/22 0219 05/29/22 0220 05/30/22 0339 05/30/22 0340  NA 141 142 141 146*  --  144 143  --   K 3.6 3.4* 3.9 3.5  --  3.6 3.7  --   CL 104 103 107 109  --  110 106  --   CO2 23 26 24 27   --  20* 21*  --   GLUCOSE 99 110* 129* 125*  --  93 97  --   BUN 38* 35* 33* 26*  --  27* 27*  --   CREATININE 2.54* 2.43* 2.23* 2.10*  --  2.07* 2.08*  --   CALCIUM 8.4* 8.5* 8.5* 8.5*  --  8.2* 8.4*  --   MG  --   --  1.8 1.8 1.7  --   --  1.8  PHOS 4.3 3.5 2.6  --   --  3.2 3.0  --     GFR: Estimated Creatinine Clearance: 23.1 mL/min (A) (by C-G formula based on SCr of 2.08 mg/dL (H)). Liver Function Tests: Recent Labs  Lab 05/25/22 0039 05/26/22 0227 05/27/22 0339 05/29/22 0220 05/30/22 0339  ALBUMIN 2.2* 2.2* 2.3* 1.9*  2.0*    Recent Labs  Lab 05/25/22 0039  LIPASE 27   CBG: Recent Labs  Lab 05/29/22 0612 05/29/22 1139 05/29/22 1638 05/29/22 2122 05/30/22 0604  GLUCAP 90 96 98 85 86    Recent Results (from the past 240 hour(s))  Urine Culture     Status: Abnormal   Collection Time: 05/21/22  7:50 PM   Specimen: Urine, Clean Catch  Result Value Ref Range Status   Specimen Description URINE, CLEAN CATCH  Final   Special Requests NONE  Final   Culture (A)  Final    <10,000 COLONIES/mL INSIGNIFICANT GROWTH Performed at [facility], [place] [address], [place], [place] [address]    Report Status 05/22/2022 FINAL  Final    Radiology Studies: No results found.  Scheduled Meds:  amLODipine  10 mg Oral Daily   aspirin EC  81 mg Oral Daily   benzonatate  100 mg Oral TID   carvedilol  12.5 mg Oral BID WC   cloNIDine  0.1 mg Transdermal Weekly   ezetimibe  10 mg Oral Daily   hydrALAZINE  10 mg Intravenous Q6H   insulin aspart  0-6 Units Subcutaneous TID WC   nystatin  5 mL Oral QID   ondansetron (ZOFRAN) IV  4 mg Intravenous TID AC   pantoprazole (PROTONIX) IV  40 mg Intravenous Q12H   potassium chloride  20 mEq Oral Once   pramipexole  0.125 mg Oral BID   rOPINIRole  0.5 mg Oral Once   rosuvastatin  40 mg Oral Daily   sertraline  25 mg Oral Daily   sucralfate  1 g Oral TID WC & HS   Continuous Infusions:  famotidine (PEPCID) IV 20 mg (05/29/22 0801)     LOS: 15 days   Time spent > 25 mins  Little Ishikawa, DO [facility]  If 7PM-7AM, please contact night-coverage  05/30/2022, 7:31 AM

## 2022-05-31 ENCOUNTER — Encounter (HOSPITAL_COMMUNITY)
Admission: EM | Disposition: A | Discharge status: Home Health | Payer: Self-pay | Source: Home / Self Care | Attending: Internal Medicine

## 2022-05-31 DIAGNOSIS — I611 Nontraumatic intracerebral hemorrhage in hemisphere, cortical: Secondary | ICD-10-CM | POA: Diagnosis not present

## 2022-05-31 DIAGNOSIS — N170 Acute kidney failure with tubular necrosis: Secondary | ICD-10-CM | POA: Diagnosis not present

## 2022-05-31 DIAGNOSIS — E7800 Pure hypercholesterolemia, unspecified: Secondary | ICD-10-CM | POA: Diagnosis not present

## 2022-05-31 DIAGNOSIS — E440 Moderate protein-calorie malnutrition: Secondary | ICD-10-CM

## 2022-05-31 DIAGNOSIS — I63512 Cerebral infarction due to unspecified occlusion or stenosis of left middle cerebral artery: Secondary | ICD-10-CM | POA: Diagnosis not present

## 2022-05-31 DIAGNOSIS — I639 Cerebral infarction, unspecified: Secondary | ICD-10-CM | POA: Diagnosis not present

## 2022-05-31 HISTORY — PX: LOOP RECORDER INSERTION: EP1214

## 2022-05-31 LAB — GLUCOSE, CAPILLARY
Glucose-Capillary: 119 mg/dL — ABNORMAL HIGH (ref 70–99)
Glucose-Capillary: 101 mg/dL — ABNORMAL HIGH (ref 70–99)

## 2022-05-31 LAB — RENAL FUNCTION PANEL
Albumin: 2.1 g/dL — ABNORMAL LOW (ref 3.5–5.0)
Anion gap: 12 (ref 5–15)
BUN: 23 mg/dL (ref 8–23)
CO2: 21 mmol/L — ABNORMAL LOW (ref 22–32)
Calcium: 8.2 mg/dL — ABNORMAL LOW (ref 8.9–10.3)
Chloride: 110 mmol/L (ref 98–111)
Creatinine, Ser: 1.92 mg/dL — ABNORMAL HIGH (ref 0.44–1.00)
GFR, Estimated: 27 mL/min — ABNORMAL LOW (ref >60)
Glucose, Bld: 97 mg/dL (ref 70–99)
Phosphorus: 2.9 mg/dL (ref 2.5–4.6)
Potassium: 3.8 mmol/L (ref 3.5–5.1)
Sodium: 143 mmol/L (ref 135–145)

## 2022-05-31 LAB — MAGNESIUM: Magnesium: 1.6 mg/dL — ABNORMAL LOW (ref 1.7–2.4)

## 2022-05-31 SURGERY — LOOP RECORDER INSERTION

## 2022-05-31 MED ORDER — NYSTATIN 100000 UNIT/ML MT SUSP: 5.0000 mL | Freq: Four times a day (QID) | OROMUCOSAL | 0 refills | Status: DC

## 2022-05-31 MED ORDER — ROSUVASTATIN CALCIUM 40 MG PO TABS: 40.0000 mg | ORAL_TABLET | Freq: Every day | ORAL | 1 refills | Status: DC

## 2022-05-31 MED ORDER — LIDOCAINE-EPINEPHRINE 1 %-1:100000 IJ SOLN
INTRAMUSCULAR | Status: DC | PRN
  Administered 2022-05-31: 10 mL

## 2022-05-31 MED ORDER — CARVEDILOL 12.5 MG PO TABS: 12.5000 mg | ORAL_TABLET | Freq: Two times a day (BID) | ORAL | 1 refills | Status: DC

## 2022-05-31 MED ORDER — CLONIDINE 0.1 MG/24HR TD PTWK: 0.1000 mg | MEDICATED_PATCH | TRANSDERMAL | 12 refills | Status: DC

## 2022-05-31 MED ORDER — SUCRALFATE 1 GM/10ML PO SUSP: 1.0000 g | Freq: Three times a day (TID) | ORAL | 0 refills | Status: DC

## 2022-05-31 MED ORDER — ASPIRIN 81 MG PO TBEC: 81.0000 mg | DELAYED_RELEASE_TABLET | Freq: Every day | ORAL | 12 refills | Status: DC

## 2022-05-31 MED ORDER — LIDOCAINE-EPINEPHRINE 1 %-1:100000 IJ SOLN
INTRAMUSCULAR | Status: AC
  Filled 2022-05-31: qty 1

## 2022-05-31 SURGICAL SUPPLY — 2 items
MONITOR CARDIAC ASSERT IQ EL (Prosthesis & Implant Heart) IMPLANT
PACK LOOP INSERTION (CUSTOM PROCEDURE TRAY) ×1 IMPLANT

## 2022-05-31 NOTE — Consult Note (Addendum)
ELECTROPHYSIOLOGY CONSULT NOTE  Patient ID: Lauren Craig MRN: [id number], DOB/AGE: [birth year]   Admit date: 05/15/2022 Date of Consult: 05/31/2022  Primary Physician: Martyn Malay, MD Primary Cardiologist: None  Primary Electrophysiologist: New to Dr. Curt Craig  Reason for Consultation: Cryptogenic stroke; recommendations regarding Implantable Loop Recorder Insurance: [employer]  History of Present Illness EP has been asked to evaluate Lauren Craig for placement of an implantable loop recorder to monitor for atrial fibrillation by Dr Lauren Craig.  The patient was admitted on 05/15/2022 with left facial droop and left upper visual field cut. She was found down by family. CT head revealed trace SAH at the right vertex. MRI demonstrated DWI artifact at right vertex and area of subarachnoid hemorrhage with scattered small acute right MCA territory infarcts    Imaging demonstrated right MCA small scattered infarcts with trace right frontal subarachnoid hemorrhage.    She has undergone workup for stroke including:  Possible etiology: right ICA bulb and siphon stenosis in the setting of AKI and dehydration, but with prior stroke concerning for cardioembolic source with negative 30 Craig monitor.   Code Stroke CT head trace subarachnoid hemorrhage at right vertex CTA head & neck no LVO, high-grade stenosis at origin of right vertebral artery, high-grade stenosis of distal right V4, bilateral P2 moderate stenosis, and bilateral ICA siphon and bulb stenosis/atherosclerosis right more than left.  MRI stable trace subarachnoid hemorrhage at right vertex with mild DWI artifact there, also scattered small acute right MCA territory infarcts 2D Echo EF 55 to 31%, grade 2 diastolic dysfunction, mildly dilated left atrium, normal atrial septum LDL 254 HgbA1c 6.4 VTE prophylaxis -SCDs clopidogrel 75 mg daily prior to admission, now on ASA 81 only due to [facility]  Therapy recommendations: Home health  PT/OT Neurology outpatient follow up in 8 weeks after discharge  Disposition: Pending  The patient has been monitored on telemetry which has demonstrated sinus rhythm with no arrhythmias.  Inpatient stroke work-up Lauren Craig not require a TEE per Neurology.   Echocardiogram as above. Lab work is reviewed.  Prior to admission, the patient denies chest pain, shortness of breath, dizziness, palpitations, or syncope.  She is recovering from her stroke with plans to return home  at discharge.  Past Medical History:  Diagnosis Date   Anxiety disorder    Arthritis    hands, hips   C. difficile colitis    Esophagitis    Essential (primary) hypertension 1990   Mixed hyperlipidemia 2010   Osteomyelitis ([place])    left 5th toe   PVD (peripheral vascular disease) (HCC)    Seasonal allergies    Stroke ([place])    Type 2 diabetes mellitus with diabetic cataract ([place]) 2002   Vitamin B12 deficiency      Surgical History:  Past Surgical History:  Procedure Laterality Date   ABDOMINAL AORTOGRAM W/LOWER EXTREMITY N/A 08/11/2017   Procedure: ABDOMINAL AORTOGRAM W/LOWER EXTREMITY;  Surgeon: Angelia Mould, MD;  Location: [place];  Service: Cardiovascular;  Laterality: N/A;   ABDOMINAL HYSTERECTOMY     AMPUTATION Left 11/14/2019   Procedure: Left Fifth ray amputation;  Surgeon: Wylene Simmer, MD;  Location: [place];  Service: Orthopedics;  Laterality: Left;   AMPUTATION TOE Left 05/17/2018   Procedure: 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;  Surgeon: Wylene Simmer, MD;  Location: [facility];  Service: Orthopedics;  Laterality: Left;   BIOPSY  10/29/2020   Procedure: BIOPSY;  Surgeon: Irene Shipper, MD;  Location: [facility];  Service: Endoscopy;;   ESOPHAGOGASTRODUODENOSCOPY (EGD) WITH PROPOFOL N/A 10/29/2020   Procedure: ESOPHAGOGASTRODUODENOSCOPY (EGD) WITH PROPOFOL;  Surgeon: Irene Shipper, MD;  Location[facility] END[facility]COPY;  Service: Endoscopy;  Laterality: N/A;   PARTIAL  HYMENECTOMY  1968   TONSILLECTOMY AND ADENOIDECTOMY     VAGINAL HYSTERECTOMY  1987     Medications Prior to Admission  Medication Sig Dispense Refill Last Dose   acetaminophen (TYLENOL) 325 MG tablet Take 2 tablets (650 mg total) by mouth every 6 (six) hours as needed for headache.   Past Week   albuterol (VENTOLIN HFA) 108 (90 Base) MCG/ACT inhaler Inhale 2 puffs into the lungs every 6 (six) hours as needed for wheezing or shortness of breath. 18 g 3 Past Week   amLODipine (NORVASC) 10 MG tablet Take 1 tablet (10 mg total) by mouth daily. 90 tablet 3 Past Week   carvedilol (COREG) 6.25 MG tablet Take 1 tablet by mouth 2 (two) times daily with a meal.   05/13/2022 at unk exact time   enalapril (VASOTEC) 20 MG tablet Take 1 tablet (20 mg total) by mouth at bedtime. 90 tablet 3 Past Week   famotidine (PEPCID) 10 MG tablet Take 1 tablet (10 mg total) by mouth daily. 90 tablet 3 unk   fluticasone (FLONASE) 50 MCG/ACT nasal spray Place 2 sprays into both nostrils daily.   unk   nystatin cream (MYCOSTATIN) Apply 1 application topically 2 (two) times daily. 90 g 1 unk   ondansetron (ZOFRAN-ODT) 4 MG disintegrating tablet Take 1 tablet (4 mg total) by mouth every 8 (eight) hours as needed for nausea or vomiting. 60 tablet 11 unk   OVER THE COUNTER MEDICATION Take 1 tablet by mouth daily. Prevagen - once daily   Past Week   pantoprazole (PROTONIX) 40 MG tablet Take 1 tablet (40 mg total) by mouth 2 (two) times daily, 30-60 minutes before breakfast and dinner. 60 tablet 11 Past Week   promethazine (PHENERGAN) 25 MG tablet Take 1 tablet (25 mg total) by mouth every 6 (six) hours as needed for nausea or vomiting. As needed 30 tablet 0 unk   sertraline (ZOLOFT) 25 MG tablet Take 1 tablet (25 mg total) by mouth daily. 90 tablet 3 Past Week   sucralfate (CARAFATE) 1 g tablet Take 1 tablet (1 g total) by mouth daily before supper. 90 tablet 3 unk   triamcinolone ointment (KENALOG) 0.1 % Apply 1 application  topically 2 (two) times daily. 80 g 1 unk   atorvastatin (LIPITOR) 80 MG tablet Take 1 tablet (80 mg total) by mouth at bedtime. (Patient not taking: Reported on 05/16/2022) 90 tablet 3 Not Taking   ezetimibe (ZETIA) 10 MG tablet Take 1 tablet (10 mg total) by mouth daily. (Patient not taking: Reported on 05/16/2022) 90 tablet 3 Not Taking   indapamide (LOZOL) 2.5 MG tablet Take 1 tablet (2.5 mg total) by mouth daily. (Patient not taking: Reported on 05/16/2022) 90 tablet 3 Not Taking    Inpatient Medications:   amLODipine  10 mg Oral Daily   aspirin EC  81 mg Oral Daily   benzonatate  100 mg Oral TID   carvedilol  12.5 mg Oral BID WC   cloNIDine  0.1 mg Transdermal Weekly   ezetimibe  10 mg Oral Daily   hydrALAZINE  10 mg Intravenous Q6H   insulin aspart  0-6 Units Subcutaneous TID WC   nystatin  5 mL Oral QID   ondansetron (ZOFRAN) IV  4 mg Intravenous TID AC  pantoprazole (PROTONIX) IV  40 mg Intravenous Q12H   potassium chloride  20 mEq Oral Once   pramipexole  0.125 mg Oral BID   rOPINIRole  0.5 mg Oral Once   rosuvastatin  40 mg Oral Daily   sertraline  25 mg Oral Daily   sucralfate  1 g Oral TID WC & HS    Allergies:  Allergies  Allergen Reactions   Penicillins Hives, Rash and Other (See Comments)    Did it involve swelling of the face/tongue/throat, SOB, or low BP? Yes Did it involve sudden or severe rash/hives, skin peeling, or any reaction on the inside of your mouth or nose? No Did you need to seek medical attention at a hospital or doctor's office? Yes When did it last happen? ? If all above answers are "NO", may proceed with cephalosporin use.    Insulin Lispro Itching and Other (See Comments)    Site of injection was inflamed, red, painful episode   Insulin Lispro Prot & Lispro Other (See Comments)    Site of injection was inflamed, red, painful episode Humalog Mix   Semaglutide Nausea And Vomiting   Metformin Diarrhea    Social History   Socioeconomic  History   Marital status: Widowed    Spouse name: Not on file   Number of children: 2   Years of education: 14   Highest education level: Associate degree: academic program  Occupational History   Occupation: Scientist, product/process development: [employer]   Occupation: Retired Scientist, 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  occasional   Drug use: No   Sexual activity: Not Currently  Other Topics Concern   Not on file  Social History Narrative   Lives with daughter   Lauren Craig   Pharmacist, hospital for 10 years   Worked as first Quarry manager in [place] Strain: Medium Risk (03/21/2022)   Overall Financial Resource Strain (CARDIA)    Difficulty of Paying Living Expenses: Somewhat hard  Food Insecurity: No Food Insecurity (05/18/2022)   Hunger Vital Sign    Worried About Running Out of Food in the Last Year: Never true    [place] in the Last Year: Never true  Recent Concern: Food Insecurity - Food Insecurity Present (03/21/2022)   Hunger Vital Sign    Worried About Running Out of Food in the Last Year: Sometimes true    Ran Out of Food in the Last Year: Never true  Transportation Needs: No Transportation Needs (05/18/2022)   PRAPARE - Hydrologist (Medical): No    Lack of Transportation (Non-Medical): No  Physical Activity: Not on file  Stress: Stress Concern Present (02/15/2021)   Lauren Craig    Feeling of Stress : Rather much  Social Connections:  Unknown (03/22/2021)   Social Connection and Isolation Panel [NHANES]    Frequency of Communication with Friends and Family: More than three times a week    Frequency of Social Gatherings with Friends and Family: More than three times a week    Attends Religious Services: Patient refused    Marine scientist or Organizations: No    Attends Archivist Meetings: Never    Marital Status: Divorced  Human resources officer Violence: Not At Risk (05/18/2022)   Humiliation, Afraid, Rape, and Kick questionnaire    Fear of Current or Ex-Partner: No    Emotionally Abused: No    Physically Abused: No    Sexually Abused: No     Family History  Problem Relation Age of Onset   Diabetes Mellitus II Mother    Cerebrovascular Disease Mother    Hypertension Father    Heart Problems Brother        pacemaker   Diabetes Mellitus II Brother    Bipolar disorder Brother       Review of Systems: All other systems reviewed and are otherwise negative except as noted above.  Physical Exam: Vitals:   05/31/22 0241 05/31/22 0435 05/31/22 0457 05/31/22 0732  BP: (!) 196/69 (!) 180/61 (!) 157/60 (!) 185/69  Pulse: 84 82 78 85  Resp: 17 17 18 20   Temp: 98.3 F (36.8 C) 98.6 F (37 C)  98.6 F (37 C)  TempSrc: Oral Oral  Oral  SpO2: 93% 92% 93% 93%  Weight:      Height:        GEN- The patient is well appearing, alert and oriented x 3 today.   Head- normocephalic, atraumatic Eyes-  Sclera clear, conjunctiva pink Ears- hearing intact Oropharynx- clear Neck- supple Lungs- Clear to ausculation bilaterally, normal work of breathing Heart- Regular rate and rhythm, no murmurs, rubs or gallops  GI- soft, NT, ND, + BS Extremities- no clubbing, cyanosis, or edema MS- no significant deformity or atrophy Skin- no rash or lesion Psych- flat but appropriate affect Neuro- No focal deficit   Labs:    Lab Results  Component Value Date   WBC 10.0 05/25/2022   HGB 9.3 (L) 05/25/2022   HCT 28.8 (L) 05/25/2022   MCV 82.1 05/25/2022   PLT 281 05/25/2022    Recent Labs  Lab 05/31/22 0353  NA 143  K 3.8  CL 110  CO2 21*  BUN 23  CREATININE 1.92*  CALCIUM 8.2*  GLUCOSE 97     Radiology/Studies:  See summary above  12-lead ECG on arrival shows NSR at 82 bpm (personally reviewed) All prior EKG's in EPIC reviewed with no documented atrial fibrillation  30 Craig event monitor 03/2021 without AF or SVT  Telemetry NSR 70-80s mostly (personally reviewed)  Assessment and Plan:  1. Cryptogenic stroke The patient presents with cryptogenic stroke.  The patient does not have a TEE planned for this AM.  I spoke at length with the patient about monitoring for afib with an implantable loop recorder.  Risks, benefits, and alteratives to implantable loop recorder were discussed with the patient today.   At this time, the patient is very clear in their decision to proceed with implantable loop recorder.   2. AKI  Cr 1.9 this am from max of > 4.5 this admission.  Wound care was reviewed with the patient (keep incision clean and dry for 3 days). Please call with questions.   Lauren Friar, PA-C 05/31/2022 9:28 AM  I  have seen and examined this patient with Lauren Craig.  Agree with above, note added to reflect my findings.  On exam, RRR, no murmurs, lungs clear.  Patient presented to the hospital with cryptogenic stroke. To date, no cause has been found. Lauren Craig plan for LINQ monitor to look for atrial fibrillation. Risks and benefits discussed. Risks include but not limited to bleeding and infection. The patient understands the risks and has agreed to the procedure.  Lauren Craig M. Lauren Holbein MD 05/31/2022 2:01 PM

## 2022-05-31 NOTE — Progress Notes (Signed)
     Referral previously received for Lauren Craig for goals of care discussion. Noted most recent palliative in-person assessment dated 05/29/2022 at which time it was recommended to follow from a distance/chart check.   Chart reviewed for Recent provider notes, nurse notes, TOC notes, vitals, labs, and imaging and updates received from RN.   At this time patient appears stable. She went for loop recorder placement today. Planned d/c to home with [facility] today after loop implantation. No plan for in person follow-up at this time. Please contact the palliative medicine provider on service for any new/urgent needs that require our assistance with this patient.  Thank you for your referral and allowing PMT to assist in Lauren Craig care.   Walden Field, NP Palliative Medicine Team Phone: [phone]  NO CHARGE

## 2022-05-31 NOTE — Progress Notes (Signed)
PIV's removed. Discharge instructions completed. Patient verbalized understanding of medication regimen, follow up appointments and discharge instructions. Patient belongings gathered and packed to discharge. Daughter, [name], will arrive to pick up at 1600

## 2022-05-31 NOTE — Progress Notes (Signed)
Physical Therapy Treatment Patient Details Name: Lauren Craig MRN: [id number] DOB: 1945-11-05 Today's Date: 05/31/2022   History of Present Illness Pt is a 77 y/o female presenting to the ED 12/17 with slurred speech and weakness, left facial droop and decreased vision L upper visual field.  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 Pt wheelchair delivered to room since last session, with focus of session on transfers to<>from WC and safe WC mobility. Pt able to squat/step pivot to [facility] with min guard assist for safety. Pt needing cues at start of session to always lock brakes during transfers with pt demonstrating good recall at end of session. Pt able to propel WC for hallway distance with bil Ues forward and bil Les backwards with pt needing light assist in small spaces for navigation. Pt declining ambulation this session. Current plan remains appropriate to address deficits and maximize functional independence and safety, and pt continues to benefit from skilled PT services to progress toward functional mobility goals.     Recommendations for follow up therapy are one component of a multi-disciplinary discharge planning process, led by the attending physician.  Recommendations may be updated based on patient status, additional functional criteria and insurance authorization.  Follow Up Recommendations  Home health PT     Assistance Recommended at Discharge Intermittent Supervision/Assistance  Patient can return home with the following A little help with walking and/or transfers;A little help with bathing/dressing/bathroom;Assistance with cooking/housework;Assist for transportation;Help with stairs or ramp for entrance    Equipment Recommendations  Other (comment)    Recommendations for Other Services       Precautions / Restrictions Precautions Precautions: Fall Restrictions Weight Bearing Restrictions: No     Mobility  Bed Mobility Overal bed mobility: Needs Assistance             General bed mobility comments: pt OOB in recliner pre and post session    Transfers Overall transfer level: Needs assistance Equipment used: None Transfers: Bed to chair/wheelchair/BSC     Step pivot transfers: Min guard Squat pivot transfers: Min guard     General transfer comment: min guard to step/squat pivot from recliner<>WC    Ambulation/Gait               General Gait Details: pt declining ambulation this date   Theme park manager mobility: Yes Wheelchair propulsion: Both upper extremities, Both lower extermities Wheelchair parts: Needs assistance Distance: 32 Wheelchair Assistance Details (indicate cue type and reason): assist for instruction on WC use and for pt to negotitate obstacels/through doorways, pt with good recall for safety to lock/unlock brakes  Modified Rankin (Stroke Patients Only) Modified Rankin (Stroke Patients Only) Pre-Morbid Rankin Score: No symptoms Modified Rankin: Moderately severe disability     Balance Overall balance assessment: Needs assistance Sitting-balance support: Feet supported, No upper extremity supported Sitting balance-Leahy Scale: Fair     Standing balance support: Bilateral upper extremity supported, During functional activity Standing balance-Leahy Scale: Poor Standing balance comment: reliant on external support  Cognition Arousal/Alertness: Awake/alert Behavior During Therapy: WFL for tasks assessed/performed Overall Cognitive Status: Within Functional Limits for tasks assessed                                 General  Comments: limited verbalization on this date        Exercises General Exercises - Lower Extremity Long Arc Quad: AROM, Strengthening, Right, Left, 20 reps, Seated Hip Flexion/Marching: AROM, Strengthening, Left, Right, 20 reps, Seated    General Comments        Pertinent Vitals/Pain Pain Assessment Pain Assessment: Faces Faces Pain Scale: Hurts a little bit Pain Location: RLE Pain Descriptors / Indicators: Grimacing Pain Intervention(s): Monitored during session, Limited activity within patient's tolerance    Home Living                          Prior Function            PT Goals (current goals can now be found in the care plan section) Acute Rehab PT Goals PT Goal Formulation: With patient Time For Goal Achievement: 05/30/22 Progress towards PT goals: Progressing toward goals    Frequency    Min 3X/week      PT Plan      Co-evaluation              AM-PAC PT "6 Clicks" Mobility   Outcome Measure  Help needed turning from your back to your side while in a flat bed without using bedrails?: A Lot Help needed moving from lying on your back to sitting on the side of a flat bed without using bedrails?: A Little Help needed moving to and from a bed to a chair (including a wheelchair)?: A Little Help needed standing up from a chair using your arms (e.g., wheelchair or bedside chair)?: A Little Help needed to walk in hospital room?: A Little Help needed climbing 3-5 steps with a railing? : Total 6 Click Score: 15    End of Session   Activity Tolerance: Patient tolerated treatment well Patient left: in chair;with call bell/phone within reach;with chair alarm set Nurse Communication: Mobility status PT Visit Diagnosis: Other abnormalities of gait and mobility (R26.89);Other symptoms and signs involving the nervous system (R29.898);Difficulty in walking, not elsewhere classified (R26.2)     Time: 3536-1443 PT Time Calculation (min) (ACUTE ONLY):  27 min  Charges:  $Therapeutic Exercise: 8-22 mins $Wheel Chair Management: 8-22 mins                     Audry Riles. PTA Acute Rehabilitation Services Office: [place] 05/31/2022, 1:43 PM

## 2022-05-31 NOTE — Progress Notes (Signed)
Occupational Therapy Treatment Patient Details Name: Lauren Craig MRN: [id number] DOB: 03/04/1946 Today's Date: 05/31/2022   History of present illness Pt is a 77 y/o female presenting to the ED 12/17 with slurred speech and weakness, left facial droop and decreased vision L upper visual field.  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 Patient performed grooming tasks seated due to declining to attempt while standing.  Patient performed 2 static standing from recliner with limited standing tolerance and reliant on BUE support.  Patient would benefit from continued OT services following discharge home with [facility] to further address functional transfers and self care to increase independence and decrease burden of care. Acute OT to continue to follow while in this setting.    Recommendations for follow up therapy are one component of a multi-disciplinary discharge planning process, led by the attending physician.  Recommendations may be updated based on patient status, additional functional criteria and insurance authorization.    Follow Up Recommendations  Home health OT     Assistance Recommended at Discharge Frequent or constant Supervision/Assistance  Patient can return home with the following  A little help with walking and/or transfers;A little help with bathing/dressing/bathroom;Assistance with cooking/housework;Assist for transportation;Help with stairs or ramp for entrance   Equipment Recommendations  None recommended by OT    Recommendations for Other Services      Precautions / Restrictions Precautions Precautions: Fall Restrictions Weight Bearing Restrictions: No       Mobility Bed Mobility Overal bed mobility: Needs  Assistance Bed Mobility: Supine to Sit     Supine to sit: Min assist     General bed mobility comments: assistance with pulling up trunk, cues for rail use    Transfers Overall transfer level: Needs assistance Equipment used: Rolling walker (2 wheels) Transfers: Sit to/from Stand, Bed to chair/wheelchair/BSC Sit to Stand: Min guard     Step pivot transfers: Min guard     General transfer comment: min guard for safety     Balance Overall balance assessment: Needs assistance Sitting-balance support: Feet supported, No upper extremity supported Sitting balance-Leahy Scale: Fair     Standing balance support: Bilateral upper extremity supported, During functional activity Standing balance-Leahy Scale: Poor Standing balance comment: reliant on external support                           ADL either performed or assessed with clinical judgement   ADL Overall ADL's : Needs assistance/impaired     Grooming: Wash/dry hands;Wash/dry face;Oral care;Set up;Sitting Grooming Details (indicate cue type and reason): patient declined attempting to perform grooming tasks standing at sink and performed seated in recliner         Upper Body Dressing : Supervision/safety;Sitting Upper Body Dressing Details (indicate cue type and reason): changed gown                        Extremity/Trunk Assessment              Vision       Perception     Praxis      Cognition Arousal/Alertness: Awake/alert Behavior During Therapy: WFL for tasks assessed/performed Overall Cognitive Status: Within Functional Limits for  tasks assessed                                 General Comments: limited verbalization on this date, responded with nods and yes/no        Exercises Exercises: General Upper Extremity General Exercises - Upper Extremity Shoulder Flexion: AROM, Both, 10 reps, Seated Shoulder ABduction: AROM, Both, 10 reps, Seated    Shoulder  Instructions       General Comments      Pertinent Vitals/ Pain       Pain Assessment Pain Assessment: Faces Faces Pain Scale: Hurts a little bit Pain Location: RLE Pain Descriptors / Indicators: Grimacing Pain Intervention(s): Monitored during session, Repositioned  Home Living                                          Prior Functioning/Environment              Frequency  Min 2X/week        Progress Toward Goals  OT Goals(current goals can now be found in the care plan section)  Progress towards OT goals: Progressing toward goals  Acute Rehab OT Goals Patient Stated Goal: go home OT Goal Formulation: With patient Time For Goal Achievement: 06/06/22 Potential to Achieve Goals: Good ADL Goals Pt Will Perform Grooming: with supervision;standing Pt Will Perform Upper Body Bathing: with supervision;with set-up;sitting Pt Will Perform Lower Body Bathing: with set-up;with supervision;sit to/from stand Pt Will Perform Upper Body Dressing: with set-up;with supervision;sitting Pt Will Perform Lower Body Dressing: with set-up;with supervision;sit to/from stand Pt Will Transfer to Toilet: with supervision;ambulating;regular height toilet;grab bars Pt Will Perform Toileting - Clothing Manipulation and hygiene: with supervision;sit to/from stand Additional ADL Goal #1: Pt will be S in and OOB for basic ADLs  Plan Discharge plan remains appropriate    Co-evaluation                 AM-PAC OT "6 Clicks" Daily Activity     Outcome Measure   Help from another person eating meals?: A Little Help from another person taking care of personal grooming?: A Little Help from another person toileting, which includes using toliet, bedpan, or urinal?: A Little Help from another person bathing (including washing, rinsing, drying)?: A Little Help from another person to put on and taking off regular upper body clothing?: A Little Help from another person to put  on and taking off regular lower body clothing?: A Little 6 Click Score: 18    End of Session Equipment Utilized During Treatment: Gait belt;Rolling walker (2 wheels)  OT Visit Diagnosis: Unsteadiness on feet (R26.81);Other abnormalities of gait and mobility (R26.89);Muscle weakness (generalized) (M62.81)   Activity Tolerance Patient tolerated treatment well   Patient Left in chair;with call bell/phone within reach;with chair alarm set   Nurse Communication Mobility status        Time: 1010-1041 OT Time Calculation (min): 31 min  Charges: OT General Charges $OT Visit: 1 Visit OT Treatments $Self Care/Home Management : 8-22 mins $Therapeutic Exercise: 8-22 mins  Lodema Hong, [place]  Office [place] 05/31/2022, 1:08 PM

## 2022-05-31 NOTE — Discharge Summary (Signed)
Physician Discharge Summary  Erva Koke EXH:[id number] DOB: 08-17-1945 DOA: 05/15/2022  PCP: Martyn Malay, MD  Admit date: 05/15/2022 Discharge date: 05/31/2022  Admitted From: Home Disposition:  Home  Recommendations for Outpatient Follow-up:  Follow up with PCP in 1-2 weeks Follow-up with neurology as scheduled Continue to follow-up with palliative care for ongoing discussion on goals of care  Home Health: PT OT nursing Equipment/Devices: Lightweight wheelchair with cushion  Discharge Condition: Stable CODE STATUS: Full Diet recommendation: Dysphagia 3 with thin liquids  Brief/Interim Summary: 77 y.o. female with history of multiple prior strokes, hypertension, arthritis, hyperlipidemia, osteomyelitis, PVD, diabetes, esophagitis and anxiety presenting after being found down at home with dysarthria and left-sided facial droop. She has a hemorrhage that is likely a transformation from an embolic stroke. She was supposed to be discharged yesterday but her creatinine is getting worse. Cr was around 2.1 when she was admitted. Creatinine worsened to 4.4 and TRH was consulted. Later patient was transferred to our service. Nephrology was also consulted, patient received IV fluid with slowly improving her renal function.   Patient admitted as above after being found down at home with left-sided facial droop and dysarthria, imaging notable for intracerebral hemorrhage, neurology admitting ultimately transferring to hospitalist service as patient improved.  Patient's AKI likely secondary to contrast mediated nephropathy improved, appreciate nephrology insight recommendations.  She is now back to baseline.  Patient continues to have some nausea which appears to be chronic in nature having difficulty taking medications which is left her blood pressure somewhat uncontrolled.  She is improving today with addition of Zofran scheduled with medications without further complication and her blood pressure  is not currently well-controlled.  Home medicine list changes as below.  Initially worked up for disposition to rehab however given her marked improvement family is requesting discharge home with home health PT OT and nursing which is certainly reasonable.  Close follow-up with PCP, neurology and GI as scheduled in the near future.   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 Patient was stabilized from neurology standpoint. In the past patient was on Plavix but now will be on aspirin only moving forward. Speech and swallow is recommending dysphagia 3 diet PT recommending HHPT Daughter able to provide supervision. EP cardiology following loop recorder to be replaced today   Acute kidney injury superimposed on CKD stage IIIb, improving Baseline creatinine 1.6-2.0.  Creatinine peaked at 4.6 on 12/23 Presumed to be contrast-induced AKI, resolving Continue to increase free water intake at home as directed   Chronic persistent nausea vomiting, poor appetite, poor p.o. intake, improved Dysphagia after stroke, resolving Abdominal bloating, improved - X-rays unremarkable - prior history of prior C. difficile colitis.  Per family this is chronic.  In the past patient has had extensive GI workup including gastric emptying study which was normal, endoscopy in 2022 showing severe esophagitis,  duodenitis, and gastritis. -CT A/P abdomen showed left-sided diverticulosis with diverticulitis,  unclear if related to symptoms given chronicity -Patient was initially placed on IV Rocephin and Flagyl but discontinued by GI on 12/26 - For now GI is recommending p.o. antacid, 4 weeks of oral Carafate, outpatient follow-up and colonoscopy with GI in 1 month.   Well controlled diabetes mellitus type 2 with complications ([place]) -Recent hemoglobin A1c 6.4, -Continue diabetic diet   HTN (hypertension) uncontrolled - Improving on amlodipine, carvedilol, clonidine patch   Hyperlipidemia - Continue Crestor, Zetia   PVD (peripheral vascular disease) (HCC) -Status post left fifth toe amputation -Outpatient follow-up with vascular as necessary, no current issues  Discharge Instructions  Discharge Instructions     Ambulatory referral to Neurology   Complete by: As directed    Follow-up with Dr. Leta Baptist in 4 weeks, patient is Dr. Gladstone Lighter patient.  Thank you      Allergies as of 05/31/2022       Reactions   Penicillins Hives, Rash, Other (See Comments)   Did it involve swelling of the face/tongue/throat, SOB, or low BP? Yes Did it involve sudden or severe rash/hives, skin peeling, or any reaction on the inside of your mouth or nose? No Did you need to seek medical attention at a hospital or doctor's office? Yes When did it last happen? ? If all above answers are "NO", may proceed with cephalosporin use.   Insulin Lispro Itching, Other (See Comments)   Site of injection was inflamed, red, painful episode   Insulin Lispro Prot & Lispro Other (See Comments)   Site of injection was inflamed, red, painful episode Humalog Mix   Semaglutide Nausea And Vomiting   Metformin Diarrhea        Medication List     STOP taking these medications    atorvastatin 80 MG tablet Commonly known as: LIPITOR   enalapril 20 MG tablet Commonly known as: VASOTEC       TAKE these medications     acetaminophen 325 MG tablet Commonly known as: TYLENOL Take 2 tablets (650 mg total) by mouth every 6 (six) hours as needed for headache. Notes to patient: Last dose given 12/31 at 04:00 AM   albuterol 108 (90 Base) MCG/ACT inhaler Commonly known as: VENTOLIN HFA Inhale 2 puffs into the lungs every 6 (six) hours as needed for wheezing or shortness of breath.   amLODipine 10 MG tablet Commonly known as: NORVASC Take 1 tablet (10 mg total) by mouth daily.   aspirin EC 81 MG tablet Take 1 tablet (81 mg total) by mouth daily. Swallow whole. Start taking on: June 01, 2022   carvedilol 12.5 MG tablet Commonly known as: COREG Take 1 tablet (12.5 mg total) by mouth 2 (two) times daily with a meal. What changed:  medication strength how much to take   cloNIDine 0.1 mg/24hr patch Commonly known as: CATAPRES - Dosed in mg/24 hr Place 1 patch (0.1 mg total) onto the skin once a week. Start taking on: June 01, 2022   ezetimibe 10 MG tablet Commonly known as: Zetia Take 1 tablet (10 mg total) by mouth daily.   famotidine 10 MG tablet Commonly known as: PEPCID Take 1 tablet (10 mg total) by mouth daily.   fluticasone 50 MCG/ACT nasal spray Commonly known as: FLONASE Place 2 sprays into both nostrils daily.   nystatin 100000 UNIT/ML suspension Commonly known as: MYCOSTATIN Take 5 mLs (500,000 Units total) by mouth 4 (four) times daily.   ondansetron 4 MG disintegrating tablet Commonly known as: ZOFRAN-ODT Take 1 tablet (4  mg total) by mouth every 8 (eight) hours as needed for nausea or vomiting.   OVER THE COUNTER MEDICATION Take 1 tablet by mouth daily. Prevagen - once daily   pantoprazole 40 MG tablet Commonly known as: PROTONIX Take 1 tablet (40 mg total) by mouth 2 (two) times daily, 30-60 minutes before breakfast and dinner.   rosuvastatin 40 MG tablet Commonly known as: CRESTOR Take 1 tablet (40 mg total) by mouth daily. Start taking on: June 01, 2022    sertraline 25 MG tablet Commonly known as: ZOLOFT Take 1 tablet (25 mg total) by mouth daily.   sucralfate 1 GM/10ML suspension Commonly known as: CARAFATE Take 10 mLs (1 g total) by mouth 4 (four) times daily -  with meals and at bedtime.               Durable Medical Equipment  (From admission, onward)           Start     Ordered   05/30/22 1429  For home use only DME lightweight manual wheelchair with seat cushion  Once       Comments: PPatient suffers from weakness which impairs their ability to perform daily activities like bathing, dressing, grooming, and toileting in the home.  A cane or walker will not resolve  issue with performing activities of daily living. A wheelchair will allow patient to safely perform daily activities. Patient is not able to propel themselves in the home using a standard weight wheelchair due to general weakness. Patient can self propel in the lightweight wheelchair. Length of need Lifetime. Accessories: elevating leg rests (ELRs), wheel locks, extensions and anti-tippers.   05/30/22 1428   05/30/22 1415  For home use only DME lightweight manual wheelchair with seat cushion  Once       Comments: Patient suffers from weakness which impairs their ability to perform daily activities like bathing, dressing, grooming, and toileting in the home.  A cane or walker will not resolve  issue with performing activities of daily living. A wheelchair will allow patient to safely perform daily activities. Patient is not able to propel themselves in the home using a standard weight wheelchair due to general weakness. Patient can self propel in the lightweight wheelchair. Length of need Lifetime. Accessories: elevating leg rests (ELRs), wheel locks, extensions and anti-tippers.   05/30/22 1415            Follow-up Information     Penumalli, Earlean Polka, MD. Schedule an appointment as soon as possible for a visit in 1 month(s).   Specialties: Neurology,  Radiology Contact information: [address] [place] [address] [phone]         [facility] Follow up.          [facility]. Follow up.   Why: [facility]--the home health agency will contact you for the first home visit. Contact information: [address] [place] [address] [phone]                Allergies  Allergen Reactions   Penicillins Hives, Rash and Other (See Comments)    Did it involve swelling of the face/tongue/throat, SOB, or low BP? Yes Did it involve sudden or severe rash/hives, skin peeling, or any reaction on the inside of your mouth or nose? No Did you need to seek medical attention at a hospital or doctor's office? Yes When did it last happen? ? If all above answers are "NO",  may proceed with cephalosporin use.    Insulin Lispro Itching and Other (See Comments)    Site of injection was inflamed, red, painful episode   Insulin Lispro Prot & Lispro Other (See Comments)    Site of injection was inflamed, red, painful episode Humalog Mix   Semaglutide Nausea And Vomiting   Metformin Diarrhea    Consultations: Neurology, cardiology EP, nephrology  Procedures/Studies: EP PPM/ICD IMPLANT  Result Date: 05/31/2022 SURGEON:  Will Meredith Leeds, MD   PREPROCEDURE DIAGNOSIS:  Cryptogenic stroke   POSTPROCEDURE DIAGNOSIS: Cryptogenic stroke    PROCEDURES:  1. Implantable loop recorder implantation   INTRODUCTION:  Latrease Kunde presents with a history of cryptogenic stroke The costs of loop recorder monitoring have been discussed with the patient.   DESCRIPTION OF PROCEDURE:  Informed written consent was obtained.  The patient required no sedation for the procedure today.  Mapping over the patient's chest was performed to identify the area where electrograms were most prominent for ILR recording.  This area was found to be the left parasternal region over the 4th intercostal space. The patients left  chest was therefore prepped and draped in the usual sterile fashion. The skin overlying the left parasternal region was infiltrated with lidocaine for local analgesia.  A 0.5-cm incision was made over the left parasternal region over the 3rd intercostal space.  A subcutaneous ILR pocket was fashioned using a combination of sharp and blunt dissection.  A Abbott Assert IQ EL+ (serial # [id number]) implantable loop recorder was then placed into the pocket  R waves were very prominent and measured 0.65mV.  Steri- Strips and a sterile dressing were then applied.  There were no early apparent complications.   CONCLUSIONS:  1. Successful implantation of a implantable loop recorder for a history of cryptogenic stroke  2. No early apparent complications. Will Meredith Leeds, MD 05/31/2022 2:18 PM   DG Abd 1 View  Result Date: 05/26/2022 CLINICAL DATA:  Abdominal distention EXAM: ABDOMEN - 1 VIEW COMPARISON:  None Available. FINDINGS: Prior cholecystectomy. Nonobstructive bowel gas pattern. No organomegaly or free air. No suspicious calcification. Vascular calcifications noted. IMPRESSION: No acute findings. Electronically Signed   By: Rolm Baptise M.D.   On: 05/26/2022 18:08   CT ABDOMEN PELVIS WO CONTRAST  Result Date: 05/22/2022 CLINICAL DATA:  Abdominal pain with nausea vomiting and diarrhea. 12/17/2020 EXAM: CT ABDOMEN AND PELVIS WITHOUT CONTRAST TECHNIQUE: Multidetector CT imaging of the abdomen and pelvis was performed following the standard protocol without IV contrast. RADIATION DOSE REDUCTION: This exam was performed according to the departmental dose-optimization program which includes automated exposure control, adjustment of the mA and/or kV according to patient size and/or use of iterative reconstruction technique. COMPARISON:  None Available. FINDINGS: Lower chest: Bibasilar dependent atelectasis with small bilateral pleural effusions. Hepatobiliary: No suspicious focal abnormality in the liver on this  study without intravenous contrast. Gallbladder surgically absent. No intrahepatic or extrahepatic biliary dilation. Pancreas: No focal mass lesion. No dilatation of the main duct. No intraparenchymal cyst. No peripancreatic edema. Spleen: No splenomegaly. No focal mass lesion. Adrenals/Urinary Tract: No adrenal nodule or mass. 17 mm water density lesion lower pole right kidney is compatible with a cyst, stable since 10/28/2020. No followup imaging is recommended. Both kidney show heterogeneous parenchymal attenuation, nonspecific but pyelonephritis could have this appearance. No evidence for hydroureter. Bladder is moderately distended. Stomach/Bowel: Stomach is unremarkable. No gastric wall thickening. No evidence of outlet obstruction. Duodenum is normally positioned as is the ligament of Treitz. No  small bowel wall thickening. No small bowel dilatation. The terminal ileum is normal. The appendix is normal. No gross colonic mass. No colonic wall thickening. Left-sided diverticulosis evident. Subtle pericolonic stranding along the descending colon (51/3) raises the question of diverticulitis. Vascular/Lymphatic: There is advanced atherosclerotic calcification of the abdominal aorta without aneurysm. There is no gastrohepatic or hepatoduodenal ligament lymphadenopathy. No retroperitoneal or mesenteric lymphadenopathy. No pelvic sidewall lymphadenopathy. Reproductive: Hysterectomy.  There is no adnexal mass. Other: No intraperitoneal free fluid. Musculoskeletal: Bones are diffusely demineralized. Degenerative changes noted in both hips. IMPRESSION: 1. Left-sided diverticulosis with subtle pericolonic stranding along the descending colon. Imaging features raise the question of diverticulitis. 2. Both kidneys show heterogeneous parenchymal attenuation, nonspecific but pyelonephritis could have this appearance. 3. Bibasilar dependent atelectasis with small bilateral pleural effusions. 4.  Aortic Atherosclerosis  (ICD10-I70.0). Electronically Signed   By: Misty Stanley M.D.   On: 05/22/2022 12:22   EEG adult  Result Date: 05/16/2022 Lora Havens, MD     05/16/2022  3:57 PM Patient Name: Rosanne Wohlfarth MRN: [id number] Epilepsy Attending: Lora Havens Referring Physician/Provider: Katy Apo, NP Date: 05/16/2022 Duration: 21.46 mins Patient history: 77 y.o. 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 She presents to the ED c/o slurred speech and weakness. CT shows a trace subarachnoid hemorrhage. EEG to evaluate for seizure. Level of alertness: Awake AEDs during EEG study: None Technical aspects: This EEG study was done with scalp electrodes positioned according to the 10-20 International system of electrode placement. Electrical activity was reviewed with band pass filter of 1-70Hz , sensitivity of 7 uV/mm, display speed of 55mm/sec with a 60Hz  notched filter applied as appropriate. EEG data were recorded continuously and digitally stored.  Video monitoring was available and reviewed as appropriate. Description: The posterior dominant rhythm consists of 7.5 Hz activity of moderate voltage (25-35 uV) seen predominantly in posterior head regions, asymmetric (right <left) and reactive to eye opening and eye closing.  EEG showed continuous 3 to 5 Hz 11 delta slowing in right hemisphere as well as intermittent generalized 3 to 6 Hz theta-delta slowing. Hyperventilation and photic stimulation were not performed.   ABNORMALITY - Continuous slow, right hemisphere - Intermittent slow, generalized - Background asymmetry, right< left IMPRESSION: This study is suggestive of cortical dysfunction in right hemisphere likely secondary to underlying structural and normality. Additionally there is mild diffuse encephalopathy, nonspecific etiology. No seizures or epileptiform discharges were seen throughout the  recording. Lora Havens   DG Swallowing Func-Speech Pathology  Result Date: 05/16/2022 Table formatting from the original result was not included. Objective Swallowing Evaluation: Type of Study: MBS-Modified Barium Swallow Study  Patient Details Name: Mariamawit Depaoli MRN: [id number] Date of Birth: 10-30-1945 Today's Date: 05/16/2022 Time: SLP Start Time (ACUTE ONLY): 16 -SLP Stop Time (ACUTE ONLY): 5638 SLP Time Calculation (min) (ACUTE ONLY): 12 min Past Medical History: Past Medical History: Diagnosis Date  Anxiety disorder   Arthritis   hands, hips  C. 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  Vitamin B12 deficiency  Past Surgical History: Past Surgical History: Procedure Laterality Date  ABDOMINAL AORTOGRAM W/LOWER EXTREMITY N/A 08/11/2017  Procedure: ABDOMINAL AORTOGRAM W/LOWER EXTREMITY;  Surgeon: Angelia Mould, MD;  Location: [facility];  Service: Cardiovascular;  Laterality: N/A;  ABDOMINAL HYSTERECTOMY    AMPUTATION Left 11/14/2019  Procedure: Left Fifth ray amputation;  Surgeon: Wylene Simmer, MD;  Location: [facility];  Service: Orthopedics;  Laterality: Left;  AMPUTATION TOE Left 05/17/2018  Procedure: 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;  Surgeon: Wylene Simmer, MD;  Location: [facility];  Service: Orthopedics;  Laterality: Left;  BIOPSY  10/29/2020  Procedure: BIOPSY;  Surgeon: Irene Shipper, MD;  Location: [facility];  Service: Endoscopy;;  ESOPHAGOGASTRODUODENOSCOPY (EGD) WITH PROPOFOL N/A 10/29/2020  Procedure: ESOPHAGOGASTRODUODENOSCOPY (EGD) WITH PROPOFOL;  Surgeon: Irene Shipper, MD;  [facility];  Service: Endoscopy;  Laterality: N/A;  PARTIAL HYMENECTOMY  1968  TONSILLECTOMY AND ADENOIDECTOMY    VAGINAL HYSTERECTOMY   1987 HPI: Pt is a 77 yo female presenting with slurred speech and weakness after unwitnessed fall. CT shows a trace SAH at the R vertex. MRI shows stable SAH at the R vertex with mild DWI infarct there as well as strong evidence of superimposed scattered small acute R MCA territory infarcts, including corona radiata and anterior insula. PMH includes: multiple prior strokes (11/2020 R ACA infarcts; evaluated by SLP and found to have moderate memory deficits and unilateral UMN dysarthria, MBS with silent aspiration x1 with thin liquids only while swallowing pill), esophagitis, anxiety, HTN, arthritis, HLD, osteomyelitis, PVD, DM type 2  Subjective: says her speech is the biggest thing that is different  Recommendations for follow up therapy are one component of a multi-disciplinary discharge planning process, led by the attending physician.  Recommendations may be updated based on patient status, additional functional criteria and insurance authorization. Assessment / Plan / Recommendation   05/16/2022   1:00 PM Clinical Impressions Clinical Impression Pt has mild oropharyngeal dysphagia noted during this MBS, although with limited ability to challenge her fully. Despite cues to take large, consecutive boluses, she takes very small and single bites/sips at a time due to nausea. When prompted to take as big of a sip as possible, she does start to have penetration of thin liquids due to impaired timing and coordination. Penetrates clear spontaneously (PAS 2), although there may still be concern for aspiration correlated with coughing events noted at bedside when pt was drinking more impulsively. [name] oral phase is slow but also without the anterior loss observed with larger bites/sips at bedside. Recommend starting with Dys 3 diet and thin liquids, avoiding mixed consistencies. Would encourage smaller sips as well as performance on MBS suggests adequate function to handle at least smaller volumes. SLP Visit Diagnosis  Dysphagia, oropharyngeal phase (R13.12) Impact on safety and function Mild aspiration risk     05/16/2022   1:00 PM Treatment Recommendations Treatment Recommendations Therapy as outlined in treatment plan below     05/16/2022   1:00 PM Prognosis Prognosis for Safe Diet Advancement Good   05/16/2022   1:00 PM Diet Recommendations SLP Diet Recommendations Dysphagia 3 (Mech soft) solids;Thin liquid;Other (Comment) Liquid Administration via Cup;Straw Medication Administration Whole meds with puree Compensations Slow rate;Small sips/bites Postural Changes Seated upright at 90 degrees     05/16/2022   1:00 PM Other Recommendations Oral Care Recommendations Oral care BID Follow Up Recommendations Acute inpatient rehab (3hours/day) Functional Status Assessment Patient has had a recent decline in their functional status and demonstrates the ability to make significant improvements in function in a reasonable and predictable amount of time.   05/16/2022   1:00 PM  Frequency and Duration  Speech Therapy Frequency (ACUTE ONLY) min 2x/week Treatment Duration 2 weeks     05/16/2022   1:00 PM Oral Phase Oral Phase Impaired Oral - Thin Teaspoon Reduced posterior propulsion Oral - Thin Cup Reduced posterior propulsion Oral - Thin Straw Reduced posterior propulsion Oral - Puree Reduced posterior propulsion Oral - Regular Reduced posterior propulsion    05/16/2022   1:00 PM Pharyngeal Phase Pharyngeal Phase Impaired Pharyngeal- Thin Teaspoon Delayed swallow initiation-pyriform sinuses Pharyngeal- Thin Cup Delayed swallow initiation-pyriform sinuses;Penetration/Aspiration before swallow Pharyngeal Material enters airway, remains ABOVE vocal cords then ejected out Pharyngeal- Thin Straw Pharyngeal residue - valleculae;Pharyngeal residue - pyriform;Delayed swallow initiation-pyriform sinuses;Penetration/Aspiration before swallow Pharyngeal Material enters airway, remains ABOVE vocal cords then ejected out Pharyngeal- Puree Delayed  swallow initiation-vallecula Pharyngeal- Regular Delayed swallow initiation-vallecula    05/16/2022   1:00 PM Cervical Esophageal Phase  Cervical Esophageal Phase [facility] Osie Bond., M.A. [place] Office [phone] Secure chat preferred 05/16/2022, 1:55 PM                     ECHOCARDIOGRAM COMPLETE  Result Date: 05/16/2022    ECHOCARDIOGRAM REPORT   Patient Name:   YMANI PORCHER Date of Exam: 05/16/2022 Medical Rec #:  [id number]      Height:       64.0 in Accession #:    [id number]     Weight:       159.4 lb Date of Birth:  May 03, 1946      BSA:          1.776 m Patient Age:    53 years       BP:           140/85 mmHg Patient Gender: F              HR:           72 bpm. Exam Location:  Inpatient Procedure: 2D Echo Indications:    stroke  History:        Patient has prior history of Echocardiogram examinations, most                 recent 12/19/2020. Risk Factors:Hypertension, Diabetes and                 Dyslipidemia.  Sonographer:    Harvie Junior Referring Phys: [id number] Otelia Santee  Sonographer Comments: Technically difficult study due to poor echo windows, suboptimal subcostal window and suboptimal apical window. Image acquisition challenging due to respiratory motion. IMPRESSIONS  1. Left ventricular ejection fraction, by estimation, is 55 to 60%. The left ventricle has normal function. The left ventricle has no regional wall motion abnormalities. There is mild asymmetric left ventricular hypertrophy of the basal-septal segment. Left ventricular diastolic parameters are consistent with Grade II diastolic dysfunction (pseudonormalization).  2. Right ventricular systolic function is normal. The right ventricular size is normal. There is normal pulmonary artery systolic pressure. The estimated right ventricular systolic pressure is 81.0 mmHg.  3. Left atrial size was mildly dilated.  4. The mitral valve is degenerative. Trivial mitral valve regurgitation. No evidence of mitral  stenosis.  5. The aortic valve is tricuspid. Aortic valve regurgitation is not visualized. Aortic valve sclerosis/calcification is present, without any evidence of aortic stenosis.  6. The inferior vena cava is normal in size with greater than 50% respiratory variability, suggesting right atrial pressure of 3 mmHg. Comparison(s): No significant change from prior study. Conclusion(s)/Recommendation(s): No intracardiac source of embolism  detected on this transthoracic study. Consider a transesophageal echocardiogram to exclude cardiac source of embolism if clinically indicated. FINDINGS  Left Ventricle: Left ventricular ejection fraction, by estimation, is 55 to 60%. The left ventricle has normal function. The left ventricle has no regional wall motion abnormalities. The left ventricular internal cavity size was normal in size. There is  mild asymmetric left ventricular hypertrophy of the basal-septal segment. Left ventricular diastolic parameters are consistent with Grade II diastolic dysfunction (pseudonormalization). Right Ventricle: The right ventricular size is normal. No increase in right ventricular wall thickness. Right ventricular systolic function is normal. There is normal pulmonary artery systolic pressure. The tricuspid regurgitant velocity is 1.70 m/s, and  with an assumed right atrial pressure of 3 mmHg, the estimated right ventricular systolic pressure is 78.4 mmHg. Left Atrium: Left atrial size was mildly dilated. Right Atrium: Right atrial size was normal in size. Pericardium: Trivial pericardial effusion is present. Presence of epicardial fat layer. Mitral Valve: The mitral valve is degenerative in appearance. Mild to moderate mitral annular calcification. Trivial mitral valve regurgitation. No evidence of mitral valve stenosis. Tricuspid Valve: The tricuspid valve is grossly normal. Tricuspid valve regurgitation is trivial. No evidence of tricuspid stenosis. Aortic Valve: The aortic valve is  tricuspid. Aortic valve regurgitation is not visualized. Aortic valve sclerosis/calcification is present, without any evidence of aortic stenosis. Aortic valve mean gradient measures 8.4 mmHg. Aortic valve peak gradient measures 15.7 mmHg. Aortic valve area, by VTI measures 2.08 cm. Pulmonic Valve: The pulmonic valve was grossly normal. Pulmonic valve regurgitation is not visualized. No evidence of pulmonic stenosis. Aorta: The aortic root and ascending aorta are structurally normal, with no evidence of dilitation. Venous: The inferior vena cava is normal in size with greater than 50% respiratory variability, suggesting right atrial pressure of 3 mmHg. IAS/Shunts: The atrial septum is grossly normal.  LEFT VENTRICLE PLAX 2D LVIDd:         3.70 cm      Diastology LVIDs:         2.60 cm      LV e' medial:    5.59 cm/s LV PW:         1.00 cm      LV E/e' medial:  19.3 LV IVS:        1.20 cm      LV e' lateral:   7.62 cm/s LVOT diam:     2.10 cm      LV E/e' lateral: 14.2 LV SV:         80 LV SV Index:   45 LVOT Area:     3.46 cm  LV Volumes (MOD) LV vol d, MOD A2C: 60.2 ml LV vol d, MOD A4C: 113.0 ml LV vol s, MOD A2C: 19.4 ml LV vol s, MOD A4C: 46.8 ml LV SV MOD A2C:     40.8 ml LV SV MOD A4C:     113.0 ml LV SV MOD BP:      59.7 ml RIGHT VENTRICLE RV S prime:     16.80 cm/s TAPSE (M-mode): 1.8 cm LEFT ATRIUM           Index LA diam:      3.40 cm 1.91 cm/m LA Vol (A4C): 90.9 ml 51.17 ml/m  AORTIC VALVE                     PULMONIC VALVE AV Area (Vmax):    1.58 cm      PV Vmax:  1.18 m/s AV Area (Vmean):   1.69 cm      PV Peak grad:  5.6 mmHg AV Area (VTI):     2.08 cm AV Vmax:           198.00 cm/s AV Vmean:          133.400 cm/s AV VTI:            0.387 m AV Peak Grad:      15.7 mmHg AV Mean Grad:      8.4 mmHg LVOT Vmax:         90.50 cm/s LVOT Vmean:        64.900 cm/s LVOT VTI:          0.232 m LVOT/AV VTI ratio: 0.60  AORTA Ao Root diam: 3.10 cm Ao Asc diam:  2.90 cm MITRAL VALVE                 TRICUSPID VALVE MV Area (PHT): 3.72 cm     TR Peak grad:   11.6 mmHg MV Decel Time: 204 msec     TR Vmax:        170.00 cm/s MV E velocity: 108.00 cm/s MV A velocity: 122.00 cm/s  SHUNTS MV E/A ratio:  0.89         Systemic VTI:  0.23 m                             Systemic Diam: 2.10 cm Eleonore Chiquito MD Electronically signed by Eleonore Chiquito MD Signature Date/Time: 05/16/2022/11:52:30 AM    Final    MR BRAIN WO CONTRAST  Result Date: 05/16/2022 CLINICAL DATA:  77 year old female neurologic deficit presenting yesterday after fall. Trace vertex subarachnoid hemorrhage. Atherosclerotic stenoses on CTA. EXAM: MRI HEAD WITHOUT CONTRAST TECHNIQUE: Multiplanar, multiecho pulse sequences of the brain and surrounding structures were obtained without intravenous contrast. COMPARISON:  CT head, CTA head and neck yesterday. Brain MRI 12/18/2020. FINDINGS: Brain: Scattered small areas of restricted diffusion in the right MCA territory, including pre motor cortex, right corona radiata (series 2, image 36) and anterior insula. Abnormal diffusion at the right superior motor strip/central sulcus is probably artifact from known subarachnoid hemorrhage there (see FLAIR series 7, image 22). No intraventricular or other extra-axial hemorrhage identified. No contralateral left hemisphere or posterior fossa restricted diffusion. Mild cytotoxic edema at the acutely affected areas. No mass effect. 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 Superimposed chronic brainstem microhemorrhages. Stable overall cerebral volume from last year. No midline shift, mass effect, evidence of mass lesion, ventriculomegaly. Cervicomedullary junction and pituitary are within normal limits. Vascular: Major intracranial vascular flow voids appear stable from last year. Skull and upper cervical spine: Visible cervical spine appears stable, negative for age. Visualized bone  marrow signal is within normal limits. Sinuses/Orbits: Stable. Other: Mild chronic mastoid effusions appear stable. Negative visible nasopharynx. IMPRESSION: 1. Stable trace SAH at the right vertex with mild DWI artifact there. 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 No hemorrhagic transformation or mass effect. 2. Underlying advanced chronic small vessel disease. Electronically Signed   By: Genevie Ann M.D.   On: 05/16/2022 09:06   CT ANGIO HEAD NECK W WO CM (CODE STROKE)  Result Date: 05/15/2022 CLINICAL DATA:  Neuro deficit, acute, stroke suspected. Slurred speech and left-sided facial droop. Patient fell last  night and was down for proximally 9 hours. EXAM: CT ANGIOGRAPHY HEAD AND NECK TECHNIQUE: Multidetector CT imaging of the head and neck was performed using the standard protocol during bolus administration of intravenous contrast. Multiplanar CT image reconstructions and MIPs were obtained to evaluate the vascular anatomy. Carotid stenosis measurements (when applicable) are obtained utilizing NASCET criteria, using the distal internal carotid diameter as the denominator. RADIATION DOSE REDUCTION: This exam was performed according to the departmental dose-optimization program which includes automated exposure control, adjustment of the mA and/or kV according to patient size and/or use of iterative reconstruction technique. CONTRAST:  53mL OMNIPAQUE IOHEXOL 350 MG/ML SOLN COMPARISON:  CT head without contrast 05/15/2022 FINDINGS: CTA NECK FINDINGS Aortic arch: Atherosclerotic calcifications are present at the origins the great vessels without focal stenosis or aneurysm. The left vertebral artery originates directly from the arch, a normal variant. Right carotid system: The right common carotid artery is within normal limits. Calcifications are present the bifurcation without a significant stenosis. Cervical right ICA  is within normal limits. Moderate patient motion is present just above the level of the scratched at moderate patient motion is present in the neck. Left carotid system: The left common carotid artery is within normal limits. Atherosclerotic changes are present at the bifurcation without a significant stenosis. The cervical left ICA is otherwise within normal limits. Vertebral arteries: The right vertebral artery is noted from the subclavian artery. A high-grade near occlusive stenosis is present at the origin of the right vertebral artery. The left vertebral artery is dominant. No other focal stenoses are present in either vertebral artery in the neck. Skeleton: Vertebral body heights are normal. 3 mm degenerative anterolisthesis present C4-5. Straightening of the normal cervical lordosis is present. No focal osseous lesions are present. Other neck: Soft tissues the neck are otherwise unremarkable. Salivary glands are within normal limits. Thyroid is normal. No significant adenopathy is present. No focal mucosal or submucosal lesions are present. Upper chest: Lung apices are clear. The thoracic inlet is within normal limits. Review of the MIP images confirms the above findings CTA HEAD FINDINGS Anterior circulation: Atherosclerotic calcifications are present within the cavernous internal carotid arteries bilaterally. A 50% stenosis is present within the cavernous right ICA. No other significant stenosis is present relative to the more distal vessel. The ICA termini are within normal limits bilaterally. The A1 and M1 segments are normal. The anterior communicating artery is patent. Mild narrowing is again seen within the distal M1 segments bilaterally. MCA bifurcations are intact. Segmental narrowing is again seen throughout ACA and MCA branch vessels without a significant proximal stenosis or occlusion. Posterior circulation: The left vertebral artery is the dominant vessel. A high-grade stenosis is present distal  right V4 segment. Vertebrobasilar junction and basilar artery normal. Left posterior cerebral artery originates from basilar tip. The right posterior cerebral artery is of fetal type. Moderate P2 segment stenoses are present bilaterally, as previously seen. Venous sinuses: The dural sinuses are patent. The straight sinus and deep cerebral veins are intact. Cortical veins are within normal limits. No significant vascular malformation is evident. Anatomic variants: Fetal type right posterior cerebral artery. Review of the MIP images confirms the above findings IMPRESSION: 1. No emergent large vessel occlusion. 2. 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 3. 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 4. Moderate P2 segment stenoses bilaterally, as previously seen. 5. Mild narrowing of the distal M1 segments bilaterally. 6. Segmental narrowing branch vessels of the  circle-of-Willis without a proximal stenosis or occlusion. Findings are similar the prior MRI, consistent with diffuse intracranial atherosclerosis. 7. Atherosclerotic changes at the carotid bifurcations and cavernous internal carotid arteries bilaterally without significant stenosis. 8.  Aortic Atherosclerosis (ICD10-I70.0). Electronically Signed   By: San Morelle M.D.   On: 05/15/2022 13:11   CT HEAD CODE STROKE WO CONTRAST  Result Date: 05/15/2022 CLINICAL DATA:  Code stroke. 77 year old female. History of chronic infarcts. Intracranial atherosclerosis. 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 Presenting with slurred speech, left-sided facial droop. Fell last night, was down for about 9 hours. EXAM: CT HEAD WITHOUT CONTRAST TECHNIQUE: Contiguous axial images were obtained from the base of the skull through the vertex without intravenous contrast. RADIATION DOSE REDUCTION: This exam was performed according to the departmental dose-optimization program which includes  automated exposure control, adjustment of the mA and/or kV according to patient size and/or use of iterative reconstruction technique. COMPARISON:  Head CT and brain MRI 12/17/2020, and earlier. FINDINGS: Brain: Small volume subarachnoid hemorrhage at the right vertex, single central or precentral sulcus involvement on series 2, image 25. No intraventricular hemorrhage. No ventriculomegaly. Stable cerebral volume. No midline shift, mass effect, or evidence of intracranial mass lesion. Patchy and confluent bilateral cerebral white matter hypodensity. Chronic appearing encephalomalacia in the left corona radiata and lentiform. No cortically based acute infarct identified. Vascular: Extensive Calcified atherosclerosis at the skull base. No suspicious intracranial vascular hyperdensity. Skull: No acute osseous abnormality identified. Sinuses/Orbits: Visualized paranasal sinuses and mastoids are stable and well aerated. Other: No orbit or scalp soft tissue injury identified. ASPECTS [facility] Stroke Program Early CT Score) Total score (0-10 with 10 being normal): 10, although also positive for trace subarachnoid hemorrhage. IMPRESSION: 1. Trace subarachnoid hemorrhage at the right vertex (single sulcus involvement). This is likely posttraumatic given history of fall and found down. 2. No superimposed acute cortically based infarct identified. 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 3. These results were communicated to Dr. Cheral Marker at 12:40 pm on 05/15/2022 by text page via the [facility] messaging system. Electronically Signed   By: Genevie Ann M.D.   On: 05/15/2022 12:41     Subjective: No acute issues or events overnight   Discharge Exam: Vitals:   05/31/22 0732 05/31/22 1115  BP: (!) 185/69 122/66  Pulse: 85 (!) 56  Resp: 20 15  Temp: 98.6 F (37 C) 98.2 F (36.8 C)  SpO2: 93% 95%   Vitals:   05/31/22 0435 05/31/22 0457 05/31/22 0732 05/31/22 1115  BP: (!) 180/61 (!) 157/60 (!) 185/69 122/66   Pulse: 82 78 85 (!) 56  Resp: 17 18 20 15   Temp: 98.6 F (37 C)  98.6 F (37 C) 98.2 F (36.8 C)  TempSrc: Oral  Oral Oral  SpO2: 92% 93% 93% 95%  Weight:      Height:        General: Pt is alert, awake, not in acute distress Cardiovascular: RRR, S1/S2 +, no rubs, no gallops Respiratory: CTA bilaterally, no wheezing, no rhonchi Abdominal: Soft, NT, ND, bowel sounds + Extremities: no edema, no cyanosis   The results of significant diagnostics from this hospitalization (including imaging, microbiology, ancillary and laboratory) are listed below for reference.     Microbiology: Recent Results (from the past 240 hour(s))  Urine Culture     Status: Abnormal   Collection Time: 05/21/22  7:50 PM   Specimen: Urine, Clean Catch  Result Value Ref Range Status   Specimen Description  URINE, CLEAN CATCH  Final   Special Requests NONE  Final   Culture (A)  Final    <10,000 COLONIES/mL INSIGNIFICANT GROWTH Performed at [facility], [place] [address] [place], [place] [address]    Report Status 05/22/2022 FINAL  Final     Labs: BNP (last 3 results) No results for input(s): "BNP" in the last 8760 hours. Basic Metabolic Panel: Recent Labs  Lab 05/26/22 0227 05/27/22 0339 05/28/22 0244 05/29/22 0219 05/29/22 0220 05/30/22 0339 05/30/22 0340 05/31/22 0353  NA 142 141 146*  --  144 143  --  143  K 3.4* 3.9 3.5  --  3.6 3.7  --  3.8  CL 103 107 109  --  110 106  --  110  CO2 26 24 27   --  20* 21*  --  21*  GLUCOSE 110* 129* 125*  --  93 97  --  97  BUN 35* 33* 26*  --  27* 27*  --  23  CREATININE 2.43* 2.23* 2.10*  --  2.07* 2.08*  --  1.92*  CALCIUM 8.5* 8.5* 8.5*  --  8.2* 8.4*  --  8.2*  MG  --  1.8 1.8 1.7  --   --  1.8 1.6*  PHOS 3.5 2.6  --   --  3.2 3.0  --  2.9    Liver Function Tests: Recent Labs  Lab 05/26/22 0227 05/27/22 0339 05/29/22 0220 05/30/22 0339 05/31/22 0353  ALBUMIN 2.2* 2.3* 1.9* 2.0* 2.1*    Recent Labs  Lab 05/25/22 0039  LIPASE 27     No results for input(s): "AMMONIA" in the last 168 hours. CBC: Recent Labs  Lab 05/25/22 0039  WBC 10.0  NEUTROABS 8.1*  HGB 9.3*  HCT 28.8*  MCV 82.1  PLT 281    Cardiac Enzymes: No results for input(s): "CKTOTAL", "CKMB", "CKMBINDEX", "TROPONINI" in the last 168 hours. BNP: Invalid input(s): "POCBNP" CBG: Recent Labs  Lab 05/30/22 1220 05/30/22 1621 05/30/22 2102 05/31/22 0626 05/31/22 1118  GLUCAP 77 89 97 119* 101*    D-Dimer No results for input(s): "DDIMER" in the last 72 hours. Hgb A1c No results for input(s): "HGBA1C" in the last 72 hours. Lipid Profile No results for input(s): "CHOL", "HDL", "LDLCALC", "TRIG", "CHOLHDL", "LDLDIRECT" in the last 72 hours. Thyroid function studies No results for input(s): "TSH", "T4TOTAL", "T3FREE", "THYROIDAB" in the last 72 hours.  Invalid input(s): "FREET3" Anemia work up No results for input(s): "VITAMINB12", "FOLATE", "FERRITIN", "TIBC", "IRON", "RETICCTPCT" in the last 72 hours. Urinalysis    Component Value Date/Time   COLORURINE YELLOW 05/21/2022 1950   APPEARANCEUR CLEAR 05/21/2022 1950   LABSPEC 1.012 05/21/2022 1950   PHURINE 5.0 05/21/2022 1950   GLUCOSEU 50 (A) 05/21/2022 1950   HGBUR NEGATIVE 05/21/2022 1950   BILIRUBINUR NEGATIVE 05/21/2022 1950   KETONESUR NEGATIVE 05/21/2022 1950   PROTEINUR 100 (A) 05/21/2022 1950   NITRITE NEGATIVE 05/21/2022 1950   LEUKOCYTESUR NEGATIVE 05/21/2022 1950   Sepsis Labs Recent Labs  Lab 05/25/22 0039  WBC 10.0    Microbiology Recent Results (from the past 240 hour(s))  Urine Culture     Status: Abnormal   Collection Time: 05/21/22  7:50 PM   Specimen: Urine, Clean Catch  Result Value Ref Range Status   Specimen Description URINE, CLEAN CATCH  Final   Special Requests NONE  Final   Culture (A)  Final    <10,000 COLONIES/mL INSIGNIFICANT GROWTH Performed at [facility], [address] [place], [place] [address]    Report Status 05/22/2022  FINAL  Final     Time coordinating discharge: Over 30 minutes  SIGNED:   Little Ishikawa, DO [facility] 05/31/2022, 2:27 PM Pager   If 7PM-7AM, please contact night-coverage [email or web address]

## 2022-05-31 NOTE — Plan of Care (Signed)
  Problem: Intracerebral Hemorrhage Tissue Perfusion: Goal: Complications of Intracerebral Hemorrhage will be minimized Outcome: Progressing   Problem: Coping: Goal: Will verbalize positive feelings about self Outcome: Progressing   Problem: Nutrition: Goal: Risk of aspiration will decrease Outcome: Progressing Goal: Dietary intake will improve Outcome: Progressing   Problem: Education: Goal: Individualized Educational Video(s) Outcome: Progressing   Problem: Fluid Volume: Goal: Ability to maintain a balanced intake and output will improve Outcome: Progressing   Problem: Health Behavior/Discharge Planning: Goal: Ability to identify and utilize available resources and services will improve Outcome: Progressing   Problem: Nutritional: Goal: Maintenance of adequate nutrition will improve Outcome: Progressing   Problem: Skin Integrity: Goal: Risk for impaired skin integrity will decrease Outcome: Progressing   Problem: Tissue Perfusion: Goal: Adequacy of tissue perfusion will improve Outcome: Progressing

## 2022-05-31 NOTE — Progress Notes (Addendum)
SLP Cancellation Note  Patient Details Name: Lauren Craig MRN: [id number] DOB: 10-25-1945   Cancelled treatment:       Reason Eval/Treat Not Completed: Patient declined PO trials, stating she had just eaten. SLP explained rationale, and that she didn't need to eat/drink much. Pt reiterated "I just ate". Will continue efforts. Chart review indicates pt lungs are CTAB and pt is afebrile.  Chais Fehringer B. Quentin Ore, [facility], [place] Speech Language Pathologist Office: [phone]  Shonna Chock 05/31/2022, 11:39 AM

## 2022-05-31 NOTE — TOC Transition Note (Signed)
Transition of Care [facility]) - CM/SW Discharge Note   Patient Details  Name: Lauren Craig MRN: [id number] Date of Birth: 1945-10-28  Transition of Care [facility]) CM/SW Contact:  Pollie Friar, RN Phone Number: 05/31/2022, 1:05 PM   Clinical Narrative:    Pt is discharging home with home health services through [facility] [facility]. Information on the AVS.  Wheelchair for home is at the bedside.  Pt has transportation home.   Final next level of care: Home w Home Health Services Barriers to Discharge: No Barriers Identified   Patient Goals and CMS Choice CMS Medicare.gov Compare Post Acute Care list provided to:: Patient Represenative (must comment) (daughter) Choice offered to / list presented to : Adult Children  Discharge Placement                         Discharge Plan and Services Additional resources added to the After Visit Summary for     Discharge Planning Services: CM Consult Post Acute Care Choice: Home Health          DME Arranged: Wheelchair manual DME Agency: [facility]       HH Arranged: PT, OT, Speech Therapy HH Agency: [place] Date [place]: 05/31/22 Time [place]: 7001 Representative spoke with at [place]: Amy  Social Determinants of Health ([place]) Interventions [place]: No Food Insecurity (05/18/2022)  Recent Concern: [place] Present (03/21/2022)  Housing: Low Risk  (05/18/2022)  Transportation Needs: No Transportation Needs (05/18/2022)  Utilities: Not At Risk (05/18/2022)  Depression (PHQ2-9): Low Risk  (12/17/2021)  Financial Resource Strain: Medium Risk (03/21/2022)  Social Connections: Unknown (03/22/2021)  Stress: Stress Concern Present (02/15/2021)  Tobacco Use: Medium Risk (05/15/2022)     Readmission Risk Interventions     No data to display

## 2022-06-01 ENCOUNTER — Telehealth: Payer: Self-pay

## 2022-06-01 ENCOUNTER — Encounter (HOSPITAL_COMMUNITY): Payer: Self-pay | Admitting: Cardiology

## 2022-06-01 ENCOUNTER — Telehealth: Payer: Self-pay | Admitting: *Deleted

## 2022-06-01 NOTE — Progress Notes (Unsigned)
  Care Coordination  Note  06/01/2022 Name: Lauren Craig MRN: [id number] DOB: Nov 24, 1945  Lauren Craig is a 77 y.o. year old primary care patient of Martyn Malay, MD. I reached out to [facility] by phone today to assist with scheduling a follow up appointment. [name] verbally consented to my assistance.       Follow up plan: Unsuccessful telephone outreach attempt made. A HIPAA compliant phone message was left for the patient providing contact information and requesting a return call.   [place]  Direct Dial: [phone]

## 2022-06-01 NOTE — Patient Outreach (Signed)
  Care Coordination [facility] Note Transition Care Management Follow-up Telephone Call Date of discharge and from [facility]: [facility]-05/[facility]/22 How have you been since you were released from the hospital? Per patient's daughter she is doing well.  Patient stayed up very late since they got home late from hospital. Any questions or concerns? Yes- Help at home- Explained to patients daughter the [employer] benefit of up to 20 hours of assistance in her home free of charge.  Daughter to contact concierge.  Items Reviewed: Did the pt receive and understand the discharge instructions provided? Yes  Medications obtained and verified? No -daughter picking up today Other? No  Any new allergies since your discharge? No  Dietary orders reviewed? Yes Do you have support at home? Yes   Home Care and Equipment/Supplies: Were home health services ordered? yes If so, what is the name of the agency? [place]  Has the agency set up a time to come to the patient's home? no Were any new equipment or medical supplies ordered?  Yes: light weight wheelchair What is the name of the medical supply agency? [employer] Were you able to get the supplies/equipment? yes Do you have any questions related to the use of the equipment or supplies? No  Functional Questionnaire: (I = Independent and D = Dependent) ADLs: Needs assistance  Bathing/Dressing- Needs assistance  Meal Prep- Needs assistance  Eating- I  Maintaining continence- D  Transferring/Ambulation- Needs assistance  Managing Meds- needs assistance  Follow up appointments reviewed:  PCP Hospital f/u appt confirmed? No   Specialist Hospital f/u appt confirmed? Yes  Scheduled to see Dr. Fabio Asa on 06/24/22 @ 10:00am. Are transportation arrangements needed? No  If their condition worsens, is the pt aware to call PCP or go to the Emergency Dept.? Yes Was the patient provided with contact information for the PCP's office or ED? Yes Was  to pt encouraged to call back with questions or concerns? Yes  SDOH assessments and interventions completed:   Yes SDOH Interventions Today    Flowsheet Row Most Recent Value  SDOH Interventions   Housing Interventions Intervention Not Indicated  Transportation Interventions Intervention Not Indicated       Care Coordination Interventions:  PCP follow up appointment requested   Encounter Outcome:  Pt. Visit Completed

## 2022-06-02 ENCOUNTER — Telehealth: Payer: Self-pay

## 2022-06-02 DIAGNOSIS — M1991 Primary osteoarthritis, unspecified site: Secondary | ICD-10-CM | POA: Diagnosis not present

## 2022-06-02 DIAGNOSIS — I69322 Dysarthria following cerebral infarction: Secondary | ICD-10-CM | POA: Diagnosis not present

## 2022-06-02 DIAGNOSIS — I129 Hypertensive chronic kidney disease with stage 1 through stage 4 chronic kidney disease, or unspecified chronic kidney disease: Secondary | ICD-10-CM | POA: Diagnosis not present

## 2022-06-02 DIAGNOSIS — E1136 Type 2 diabetes mellitus with diabetic cataract: Secondary | ICD-10-CM | POA: Diagnosis not present

## 2022-06-02 DIAGNOSIS — I69392 Facial weakness following cerebral infarction: Secondary | ICD-10-CM | POA: Diagnosis not present

## 2022-06-02 DIAGNOSIS — N1832 Chronic kidney disease, stage 3b: Secondary | ICD-10-CM | POA: Diagnosis not present

## 2022-06-02 DIAGNOSIS — K209 Esophagitis, unspecified without bleeding: Secondary | ICD-10-CM | POA: Diagnosis not present

## 2022-06-02 DIAGNOSIS — I739 Peripheral vascular disease, unspecified: Secondary | ICD-10-CM | POA: Diagnosis not present

## 2022-06-02 DIAGNOSIS — E785 Hyperlipidemia, unspecified: Secondary | ICD-10-CM | POA: Diagnosis not present

## 2022-06-02 DIAGNOSIS — I69354 Hemiplegia and hemiparesis following cerebral infarction affecting left non-dominant side: Secondary | ICD-10-CM | POA: Diagnosis not present

## 2022-06-02 NOTE — Telephone Encounter (Signed)
Please have Kinde call if after recheck and taking medications values >160/100.   Dorris Singh, MD  Family Medicine Teaching Service

## 2022-06-02 NOTE — Progress Notes (Signed)
Raquel Sarna Page CMA scheduled call with Dr. Owens Shark 06/03/22

## 2022-06-02 NOTE — Telephone Encounter (Signed)
[name] [facility] PT calls nurse line reporting high blood pressure at visit.   He reports BP on arrival was 183/85. He reports she had not taken her medications yet today. He rechecked her BP while on the phone with me 156/77 and this was ~30 minutes after she had taken medications.   I spoke with daughter who denied any dizziness, vision changes, headaches, chest pains or SOB.   Patient scheduled for tomorrow with PCP for hospital FU.   Precautions discussed in the meantime.   [name] is requesting parameters for patients BP to call our office.  He is requesting verbal orders for [facility] PT as follows. 2x a week 4 weeks.   Verbal order given.

## 2022-06-03 ENCOUNTER — Ambulatory Visit (INDEPENDENT_AMBULATORY_CARE_PROVIDER_SITE_OTHER): Payer: PPO | Admitting: Family Medicine

## 2022-06-03 ENCOUNTER — Other Ambulatory Visit: Payer: Self-pay

## 2022-06-03 ENCOUNTER — Telehealth: Payer: Self-pay

## 2022-06-03 ENCOUNTER — Encounter: Payer: Self-pay | Admitting: Family Medicine

## 2022-06-03 ENCOUNTER — Ambulatory Visit (HOSPITAL_COMMUNITY)
Admission: RE | Admit: 2022-06-03 | Discharge: 2022-06-03 | Disposition: A | Discharge status: Home / Self Care | Payer: PPO | Source: Ambulatory Visit | Attending: Family Medicine | Admitting: Family Medicine

## 2022-06-03 VITALS — BP 190/58 | HR 57

## 2022-06-03 DIAGNOSIS — N1832 Chronic kidney disease, stage 3b: Secondary | ICD-10-CM | POA: Diagnosis not present

## 2022-06-03 DIAGNOSIS — R600 Localized edema: Secondary | ICD-10-CM | POA: Insufficient documentation

## 2022-06-03 DIAGNOSIS — I63512 Cerebral infarction due to unspecified occlusion or stenosis of left middle cerebral artery: Secondary | ICD-10-CM | POA: Diagnosis not present

## 2022-06-03 DIAGNOSIS — R112 Nausea with vomiting, unspecified: Secondary | ICD-10-CM | POA: Diagnosis not present

## 2022-06-03 DIAGNOSIS — I611 Nontraumatic intracerebral hemorrhage in hemisphere, cortical: Secondary | ICD-10-CM | POA: Diagnosis not present

## 2022-06-03 DIAGNOSIS — I10 Essential (primary) hypertension: Secondary | ICD-10-CM | POA: Diagnosis not present

## 2022-06-03 DIAGNOSIS — D692 Other nonthrombocytopenic purpura: Secondary | ICD-10-CM | POA: Diagnosis not present

## 2022-06-03 MED ORDER — HYDRALAZINE HCL 50 MG PO TABS: 50.0000 mg | ORAL_TABLET | Freq: Two times a day (BID) | ORAL | 1 refills | Status: DC

## 2022-06-03 NOTE — Telephone Encounter (Signed)
Care After Your Loop Recorder  You have a [employer] Loop Recorder   Monitor your cardiac device site for redness, swelling, and drainage. Call the device clinic at [phone] if you experience these symptoms or fever/chills.  If you notice bleeding from your site, hold firm, but gently pressure with two fingers for 5 minutes. Dried blood on the steri-strips when removing the outer bandage is normal.   Keep the large square bandage on your site for 24 hours and then you may remove it yourself. Keep the steri-strips underneath in place.   You may shower after 72 hours / 3 days from your procedure with the steri-strips in place. They will usually fall off on their own, or may be removed after 10 days. Pat dry.   Avoid lotions, ointments, or perfumes over your incision until it is well-healed.  Please do not submerge in water until your site is completely healed.   Your device is MRI compatible.   Remote monitoring is used to monitor your cardiac device from home. This monitoring is scheduled every month by our office. It allows [place] to keep an eye on the function of your device to ensure it is working properly.   Spoke with patient and all above information given. She did see her primary today who assessed the loop recorder wound area. Outer bandage removed and there was still some bleeding present per daughter. Dr. Owens Shark re-applied outer dressing, steri strips still intact. Daughter is going to keep outer bandage on until Monday and then remove. She will call the device clinic with an update. If all is well, we will discuss showers at that point. Patient has no further questions and is doing well at this time.

## 2022-06-03 NOTE — Telephone Encounter (Signed)
-----   Message from Shirley Friar, PA-C sent at 05/31/2022  2:47 PM EST ----- Same day loop, 05/31/22 Dr. Curt Bears

## 2022-06-03 NOTE — Assessment & Plan Note (Signed)
Has follow up scheduled with GI Doing well with symptoms now that she is home Monitor weight closely

## 2022-06-03 NOTE — Assessment & Plan Note (Signed)
Discussed at length Referral to nephrology (also referred in March 2023) Reviewed avoiding NSAIDs

## 2022-06-03 NOTE — Patient Instructions (Addendum)
It was wonderful to see you today.  Please bring ALL of your medications with you to every visit.   Today we talked about:   - Please return for blood work after your ultrasound   - Please START your new medication for blood pressure--Hydralazine 50 mg twice a day   - Call [phone] to schedule with Neurology (brain doctors)  I have referred you to Nephrology (kidney doctors)  and Palliative Care to further evaluate your concern. If you do not received a phone call about this appointment within 2 weeks, please call our office back at [phone]. Jazmin Hartsell coordinates our referrals and can assist you in this.    Please follow up in 1 months   Thank you for choosing [facility].   Please call [phone] with any questions about today's appointment.  Please be sure to schedule follow up at the front  desk before you leave today.   Dorris Singh, MD  Family Medicine

## 2022-06-03 NOTE — Assessment & Plan Note (Signed)
New changes include dysathria, left sided facial droop Urinary incontinence worsened due to decreased mobility DME for bedside commode  Number given to Neurology

## 2022-06-03 NOTE — Assessment & Plan Note (Signed)
Patient unable to afford clonidine patch Reviewed options--given limitations with renal function and side effects will try Hydralazine 50 mg BID (may need to go to TID dosing) Pending renal function, may be able to restart ACE or ARB  BMP today

## 2022-06-03 NOTE — Progress Notes (Signed)
SUBJECTIVE:   CHIEF COMPLAINT: Hospital follow up  HPI:   Lauren Craig is a 77 y.o.  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  Patient was recently admitted to the hospital from December 17 through January 2 for an subarachnoid hemorrhage and small new infarcts in the right corona radiata.  Findings on exam and presentation included quite profound dysarthria and left-sided facial droop which have somewhat improved.  Hospital course is reviewed and complicated by acute kidney injury as well as persistent nausea and vomiting.  She was discharged just 3 days ago.  She is since been living with her daughter.  She is brought in today via wheelchair.  She reports overall she is doing quite well.  She is eating and drinking well no longer vomiting.  Typically foods outside the home because her nausea and vomiting to really flare.  Medications   She is not taking her clonidine patch as it is prohibitively expensive. Otherwise, she thinks she is taking her medications as prescribed. Daughter not 222% certain on statin (changed from atorvastatin to rosuvastatin). Denies headaches, chest pain, blurry vision,dyspnea.   LE Edema New over last day is bilateral but L much greater than right LE edema. No associated symptoms. No pain, falls, trauma, dyspnea, palpitations, chest pain. Feels her breathing is doing well.   PERTINENT  PMH / PSH/Family/Social History :  CKD IIIB-recent AKI CVA now with some dysathria, L sided facial droop HTN difficult to control, now off ACE/ARB due to AKI  Recurrent N/V, has gastroenterology follow up   OBJECTIVE:   BP (!) 190/58   Pulse (!) 57   SpO2 93%   Today's weight:  Review of prior weights:  Cardiac: Regular rate and rhythm. Normal S1/S2. No murmurs, rubs, or gallops appreciated. Lungs: Clear bilaterally  to ascultation.  Abdomen: Normoactive bowel sounds. No tenderness to deep or light palpation. No rebound or guarding.   Psych: Pleasant and appropriate  Skin: LINQ recorder dressing peeling off, bloody. This was removed and replaced with clean, dry gauze.  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 ([place]) New changes include dysathria, left sided facial droop Urinary incontinence worsened due to decreased mobility DME for bedside commode  Number given to Neurology    HTN (hypertension) Difficult to Control with Isolated Systolic HTN  Patient unable to afford clonidine patch Reviewed options--given limitations with renal function and side effects will try Hydralazine 50 mg BID (may need to go to TID dosing) Pending renal function, may be able to restart ACE or ARB  BMP today     Lower extremity edema Differential includes DVT (recent hospital stay), new heart failure, medication side effect, venous stasis  DVT ultrasound this AM CBC, CMP, and BNP ordered Oxygen saturation near where it was in hospital (did need 2L O2 while in hospital but weaned before discharge)  Reviewed return precautions   At follow up please review medications (did not bring them with her today) and adjust anti-hypertensives as  indicated    Dorris Singh, MD  [place]

## 2022-06-06 ENCOUNTER — Telehealth: Payer: Self-pay

## 2022-06-06 ENCOUNTER — Telehealth: Payer: Self-pay | Admitting: Family Medicine

## 2022-06-06 NOTE — Telephone Encounter (Signed)
Nelson from [facility] calling for PT verbal orders as follows:  1 time(s) weekly for 1 week(s), then 2 time(s) weekly for 4 week(s)  Also requesting verbal orders for speech therapy and OT evaluation.   Verbal orders given per [facility] protocol  Talbot Grumbling, RN

## 2022-06-06 NOTE — Telephone Encounter (Signed)
DME order for bedside commode has been sent to [employer].

## 2022-06-06 NOTE — Telephone Encounter (Signed)
Called with results---discussed need for blood work. Dorris Singh, MD  Family Medicine Teaching Service

## 2022-06-06 NOTE — Telephone Encounter (Signed)
Agree, compliance concerns noted.  Dorris Singh, MD  Family Medicine Teaching Service

## 2022-06-06 NOTE — Telephone Encounter (Signed)
Lauren Craig has been contacted with blood pressure parameters.

## 2022-06-06 NOTE — Telephone Encounter (Signed)
[facility] PT calls nurse line to give abnormal blood pressure reading at home visit today.   Mont reports he is in the home now and reports prior to therapy her BP was 202/80. He reports he checked it and noted "no change."   I spoke with daughter while he was still there. She reports the patient has not had any of her medications today.   Daughter denies any dizziness, headaches, chest pain or vision changes at this time.   Daughter reports they will not be going to the ED. Daughter advised to have her mother take her BP medications now and repeat her BP in ~30 minutes. She confirms she has BP cuff. Daughter advised if still well in the 200s to call EMS.   Daughter agreed with plan.   Strongly encouraged compliance with medications.

## 2022-06-06 NOTE — Telephone Encounter (Signed)
It appears I did not document Nelsons phone number.   Will await return call to give parameters for blood pressure.

## 2022-06-07 NOTE — Telephone Encounter (Signed)
LM for patient to call back with update on wound site.

## 2022-06-08 NOTE — Telephone Encounter (Signed)
Spoke with [name] daughter.  Per daughter [name] wound site has healed.  No concerns.  Explained that the loop recorder will monitor Pt for up to 3-4 years.    Advised if afib or other abnormality is discovered will call.  Advised no news is good news.  Daughter thankful for information.  All questions answered.

## 2022-06-10 ENCOUNTER — Encounter: Payer: Self-pay | Admitting: Family Medicine

## 2022-06-10 DIAGNOSIS — I63512 Cerebral infarction due to unspecified occlusion or stenosis of left middle cerebral artery: Secondary | ICD-10-CM

## 2022-06-13 NOTE — Telephone Encounter (Signed)
Referral previously ordered by Jerrye Bushy, MD

## 2022-06-14 ENCOUNTER — Telehealth: Payer: Self-pay

## 2022-06-14 NOTE — Telephone Encounter (Signed)
Lauren Craig with [name] calls nurse line to request verbal orders.   Lauren Craig is requesting a nursing evaluation for medication compliance and blood pressure.   Verbal order given for nursing evaluation.

## 2022-06-16 ENCOUNTER — Telehealth: Payer: Self-pay

## 2022-06-16 NOTE — Telephone Encounter (Signed)
Nurse team, can you clarify if this referral was ever sent to [facility]? I got a message from patient's daughter (under daughter's mychart account) that they are still needing a referral to [facility] but it looks like this is what Dr. Owens Shark ordered here.  Thanks Leeanne Rio, MD

## 2022-06-16 NOTE — Telephone Encounter (Signed)
Lauren Craig [facility] RN with [facility] LVM on nurse line for several concerns.   Verbal order request for skilled nursing. Medication list faxed to [facility]. Lauren Craig reports there seems to be a a lot of confusion on what the patient is supposed to be taking. Fax: [phone]. BP 170/100 today. Lauren Craig reports patient has not had her medications.  Lauren Craig requesting current weight for patient.  Lauren Craig reports symptoms of thrush. She feels this may be causing the patient discomfort when taking her medication. She reports she was prescribed a nystatin solution. However, reports she does not believe she is using it.   I attempted to call Lauren Craig back, however no answer. VM. WIll attempt to call her back tomorrow.   Will forward to PCP.

## 2022-06-17 NOTE — Telephone Encounter (Signed)
Covering for Dr. Owens Shark.  Page, were you able to get any more info from this [facility] RN?  Thanks Leeanne Rio, MD

## 2022-06-24 ENCOUNTER — Ambulatory Visit (INDEPENDENT_AMBULATORY_CARE_PROVIDER_SITE_OTHER): Payer: PPO | Admitting: Physician Assistant

## 2022-06-24 ENCOUNTER — Encounter: Payer: Self-pay | Admitting: Physician Assistant

## 2022-06-24 VITALS — BP 150/68 | HR 58 | Ht 64.0 in

## 2022-06-24 DIAGNOSIS — R112 Nausea with vomiting, unspecified: Secondary | ICD-10-CM | POA: Diagnosis not present

## 2022-06-24 DIAGNOSIS — K219 Gastro-esophageal reflux disease without esophagitis: Secondary | ICD-10-CM

## 2022-06-24 NOTE — Patient Instructions (Addendum)
_______________________________________________________  If your blood pressure at your visit was 140/90 or greater, please contact your primary care physician to follow up on this.  _______________________________________________________  If you are age 78 or older, your body mass index should be between 23-30. Your Body mass index is 29.97 kg/m. If this is out of the aforementioned range listed, please consider follow up with your Primary Care Provider.  If you are age 75 or younger, your body mass index should be between 19-25. Your Body mass index is 29.97 kg/m. If this is out of the aformentioned range listed, please consider follow up with your Primary Care Provider.   ________________________________________________________  The  GI providers would like to encourage you to use [facility] to communicate with providers for non-urgent requests or questions.  Due to long hold times on the telephone, sending your provider a message by [facility] may be a faster and more efficient way to get a response.  Please allow 48 business hours for a response.  Please remember that this is for non-urgent requests.  _______________________________________________________    It was a pleasure to see you today.  Lets keep doing what you are doing right now as it seems to be working.  Please call if you have any concerns.  Sincerely, Ellouise Newer, PA-C

## 2022-06-24 NOTE — Progress Notes (Signed)
Noted  

## 2022-06-24 NOTE — Progress Notes (Signed)
Chief Complaint: Follow-up nausea and vomiting  HPI:    Mrs. Rylander is a 77 year old female, known to Dr. Henrene Pastor, with a past medical history as listed below including PVD, stroke and multiple others, who returns to clinic today for follow-up of her vomiting, diarrhea and abdominal pain.  06/01/2021 patient seen in clinic by Tye Savoy for recurrent intermittent nausea, vomiting and diarrhea.  She was evaluated in the hospital for the symptoms in June 2022 and found to be indeterminate for C. difficile infection and diarrhea resolved with a course of Vancomycin.  EGD was remarkable for severe esophagitis probably related to vomiting and severe duodenitis.  Symptoms resolved with better glucose control, PPI and Carafate.  At that time repeated C. difficile PCR, recommended diagnostic colonoscopy off Plavix if C. difficile negative.  Discussed repeat EGD due to recurrent nausea/persistent anemia/severe esophagitis and duodenitis.  Discussed a normocytic anemia on Plavix with no overt GI bleeding.  She was first noted to be anemic in early June and her hemoglobin had remained low stable around 11 since then.    08/28/2018 3 repeat C. difficile PCR positive.    08/31/21 patient started on Dificid for repeat C. difficile.    12/17/2021 patient seen by family medicine.  At that time discussed nausea and vomiting.  She found the trigger for her symptoms was if she ate large meals or ate quickly.    02/22/2022 patient seen in clinic and described it 2 to 3 days out of the week she could not eat and felt nauseous and often had vomiting or diarrhea or both.  Also some pain in her epigastrium.  That time discussed previous EGD with gastritis and duodenitis and ordered a gastric emptying study given that she had a lot of the symptoms with a larger meal.  Discussed that pending results from that could increase her PPI/Carafate to see if that was helpful.    03/11/2022 gastric emptying study was normal.  She was told to  increase her Pantoprazole to 40 twice daily.    04/05/2022 patient seen in clinic by me accompanied by her daughter.  They describe she is doing much better on the Pantoprazole 40 mg twice a day.  She only occasionally had to use the Zofran.  At that time continue Pantoprazole 40 mg twice daily and refill of Zofran.    05/15/2022 patient admitted to the hospital for stroke.    Today, the patient returns to clinic accompanied by her daughter.  They tell me this is her first outing since being discharged from the hospital after her recent stroke.  She is doing quite well per her daughter.  Really no GI complaints, continues on all of her medications including Pepcid 10 mg in the morning, Pantoprazole 40 mg twice daily before breakfast and dinner and Carafate 1 g 4 times daily.  She has no concerns today GI wise.    Denies fever, chills, nausea, vomiting or change in bowel habits.  Past Medical History:  Diagnosis Date   Anxiety disorder    Arthritis    hands, hips   C. 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   Vitamin B12 deficiency     Past Surgical History:  Procedure Laterality Date   ABDOMINAL AORTOGRAM W/LOWER EXTREMITY N/A 08/11/2017   Procedure: ABDOMINAL AORTOGRAM W/LOWER EXTREMITY;  Surgeon: Angelia Mould, MD;  Location: [facility];  Service: Cardiovascular;  Laterality: N/A;   ABDOMINAL HYSTERECTOMY     AMPUTATION Left 11/14/2019   Procedure: Left Fifth ray amputation;  Surgeon: Wylene Simmer, MD;  Location: [facility];  Service: Orthopedics;  Laterality: Left;   AMPUTATION TOE Left 05/17/2018   Procedure: 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;  Surgeon: Wylene Simmer, MD;  Location: [facility];  Service:  Orthopedics;  Laterality: Left;   BIOPSY  10/29/2020   Procedure: BIOPSY;  Surgeon: Irene Shipper, MD;  Location: [facility];  Service: Endoscopy;;   ESOPHAGOGASTRODUODENOSCOPY (EGD) WITH PROPOFOL N/A 10/29/2020   Procedure: ESOPHAGOGASTRODUODENOSCOPY (EGD) WITH PROPOFOL;  Surgeon: Irene Shipper, MD;  Location: [facility];  Service: Endoscopy;  Laterality: N/A;   LOOP RECORDER INSERTION N/A 05/31/2022   Procedure: LOOP RECORDER INSERTION;  Surgeon: Constance Haw, MD;  Location: [facility];  Service: Cardiovascular;  Laterality: N/A;   PARTIAL HYMENECTOMY  1968   TONSILLECTOMY AND ADENOIDECTOMY     VAGINAL HYSTERECTOMY  1987    Current Outpatient Medications  Medication Sig Dispense Refill   acetaminophen (TYLENOL) 325 MG tablet Take 2 tablets (650 mg total) by mouth every 6 (six) hours as needed for headache. (Patient not taking: Reported on 06/03/2022)     albuterol (VENTOLIN HFA) 108 (90 Base) MCG/ACT inhaler Inhale 2 puffs into the lungs every 6 (six) hours as needed for wheezing or shortness of breath. 18 g 3   amLODipine (NORVASC) 10 MG tablet Take 1 tablet (10 mg total) by mouth daily. 90 tablet 3   aspirin EC 81 MG tablet Take 1 tablet (81 mg total) by mouth daily. Swallow whole. 30 tablet 12   carvedilol (COREG) 12.5 MG tablet Take 1 tablet (12.5 mg total) by mouth 2 (two) times daily with a meal. 60 tablet 1   cloNIDine (CATAPRES - DOSED IN MG/24 HR) 0.1 mg/24hr patch Place 1 patch (0.1 mg total) onto the skin once a week. (Patient not taking: Reported on 06/03/2022) 4 patch 12   ezetimibe (ZETIA) 10 MG tablet Take 1 tablet (10 mg total) by mouth daily. 90 tablet 3   famotidine (PEPCID) 10 MG tablet Take 1 tablet (10 mg total) by mouth daily. 90 tablet 3   fluticasone (FLONASE) 50 MCG/ACT nasal spray Place 2 sprays into both nostrils daily.     hydrALAZINE (APRESOLINE) 50 MG tablet Take 1 tablet (50 mg total) by mouth in the morning and at bedtime. 60 tablet 1   nystatin  (MYCOSTATIN) 100000 UNIT/ML suspension Take 5 mLs (500,000 Units total) by mouth 4 (four) times daily. 60 mL 0   ondansetron (ZOFRAN-ODT) 4 MG disintegrating tablet Take 1 tablet (4 mg total) by mouth every 8 (eight) hours as needed for nausea or vomiting. 60 tablet 11   OVER THE COUNTER MEDICATION Take 1 tablet by mouth daily. Prevagen - once daily     pantoprazole (PROTONIX) 40 MG tablet Take 1 tablet (40 mg total) by mouth 2 (two) times daily, 30-60 minutes before breakfast and dinner. (Patient not taking: Reported on 06/03/2022) 60 tablet 11   rosuvastatin (CRESTOR) 40 MG tablet Take 1 tablet (40 mg total) by mouth daily. 30 tablet 1   sertraline (ZOLOFT) 25 MG tablet Take 1 tablet (25 mg total) by mouth daily. 90 tablet 3   sucralfate (  CARAFATE) 1 GM/10ML suspension Take 10 mLs (1 g total) by mouth 4 (four) times daily -  with meals and at bedtime. 420 mL 0   No current facility-administered medications for this visit.    Allergies as of 06/24/2022 - Review Complete 06/03/2022  Allergen Reaction Noted   Penicillins Hives, Rash, and Other (See Comments) 07/24/2017   Insulin lispro Itching and Other (See Comments) 08/07/2017   Insulin lispro prot & lispro Other (See Comments) 07/11/2018   Semaglutide Nausea And Vomiting 06/28/2019   Metformin Diarrhea 06/28/2019    Family History  Problem Relation Age of Onset   Diabetes Mellitus II Mother    Cerebrovascular Disease Mother    Hypertension Father    Heart Problems Brother        pacemaker   Diabetes Mellitus II Brother    Bipolar disorder Brother     Social History   Socioeconomic History   Marital status: Widowed    Spouse name: Not on file   Number of children: 2   Years of education: 14   Highest education level: Associate degree: academic program  Occupational History   Occupation: Scientist, product/process development: [employer]   Occupation: Retired Scientist, water quality: [employer]   Occupation: Arts development officer    Comment: part-time  Tobacco Use   Smoking status: Former    Packs/day: 1.00    Types: Cigarettes    Quit date: 2004    Years since quitting: 20.0   Smokeless tobacco: Never  Vaping Use   Vaping Use: Never used  Substance and Sexual Activity   Alcohol use: Not Currently    Alcohol/week: 1.0 standard drink of alcohol    Types: 1 Glasses of wine per week    Comment: wine.  occasional   Drug use: No   Sexual activity: Not Currently  Other Topics Concern   Not on file  Social History Narrative   Lives with daughter   Marciano Sequin 1 a day   Pharmacist, hospital for 10 years   Worked as first Quarry manager in [place] Strain: Medium Risk (03/21/2022)   Overall Financial Resource Strain (CARDIA)    Difficulty of Paying Living Expenses: Somewhat hard  Food Insecurity: No Food Insecurity (05/18/2022)   Hunger Vital Sign    Worried About Running Out of Food in the Last Year: Never true    [place] in the Last Year: Never true  Recent Concern: Food Insecurity - Food Insecurity Present (03/21/2022)   Hunger Vital Sign    Worried About Running Out of Food in the Last Year: Sometimes true    Ran Out of Food in the Last Year: Never true  Transportation Needs: No Transportation Needs (06/01/2022)   PRAPARE - Hydrologist (Medical): No    Lack of Transportation (Non-Medical): No  Physical Activity: Not on file  Stress: Stress Concern Present (02/15/2021)   [place]    Feeling of Stress : Rather much  Social Connections: Unknown (03/22/2021)   Social Connection and Isolation Panel [NHANES]    Frequency of Communication with Friends and Family: More than three times a week    Frequency of Social Gatherings with Friends and Family: More than three times a week    Attends Religious  Services: Patient refused  Active Member of Clubs or Organizations: No    Attends Archivist Meetings: Never    Marital Status: Divorced  Human resources officer Violence: Not At Risk (05/18/2022)   Humiliation, Afraid, Rape, and Kick questionnaire    Fear of Current or Ex-Partner: No    Emotionally Abused: No    Physically Abused: No    Sexually Abused: No    Review of Systems:    Constitutional: No weight loss, fever or chills Cardiovascular: No chest pain  Respiratory: No SOB Gastrointestinal: See HPI and otherwise negative   Physical Exam:  Vital signs: BP (!) 150/68 (BP Location: Left Arm, Patient Position: Sitting, Cuff Size: Normal)   Pulse (!) 58   Ht 5\' 4"  (1.626 m)   BMI 29.97 kg/m    Constitutional:   Pleasant Elderly Caucasian female appears to be in NAD, Well developed, Well nourished, alert and cooperative Respiratory: Respirations even and unlabored. Lungs clear to auscultation bilaterally.   No wheezes, crackles, or rhonchi.  Cardiovascular: Normal S1, S2. No MRG. Regular rate and rhythm. No peripheral edema, cyanosis or pallor.  Gastrointestinal:  Soft, nondistended, nontender. No rebound or guarding. Normal bowel sounds. No appreciable masses or hepatomegaly. Rectal:  Not performed.  Msk:  Symmetrical without gross deformities. Without edema, no deformity or joint abnormality. +in wheelchair Psychiatric: Oriented to person, place and time. Demonstrates good judgement and reason without abnormal affect or behaviors.  RELEVANT LABS AND IMAGING: CBC    Component Value Date/Time   WBC 10.0 05/25/2022 0039   RBC 3.51 (L) 05/25/2022 0039   HGB 9.3 (L) 05/25/2022 0039   HGB 12.1 12/17/2021 1458   HCT 28.8 (L) 05/25/2022 0039   HCT 36.1 12/17/2021 1458   PLT 281 05/25/2022 0039   PLT 288 12/17/2021 1458   MCV 82.1 05/25/2022 0039   MCV 80 12/17/2021 1458   MCH 26.5 05/25/2022 0039   MCHC 32.3 05/25/2022 0039   RDW 14.7 05/25/2022 0039   RDW 14.9  12/17/2021 1458   LYMPHSABS 0.8 05/25/2022 0039   LYMPHSABS 1.6 06/07/2021 1140   MONOABS 0.9 05/25/2022 0039   EOSABS 0.1 05/25/2022 0039   EOSABS 0.2 06/07/2021 1140   BASOSABS 0.0 05/25/2022 0039   BASOSABS 0.0 06/07/2021 1140    CMP     Component Value Date/Time   NA 143 05/31/2022 0353   NA 140 11/05/2021 1012   K 3.8 05/31/2022 0353   CL 110 05/31/2022 0353   CO2 21 (L) 05/31/2022 0353   GLUCOSE 97 05/31/2022 0353   BUN 23 05/31/2022 0353   BUN 24 11/05/2021 1012   CREATININE 1.92 (H) 05/31/2022 0353   CALCIUM 8.2 (L) 05/31/2022 0353   PROT 6.9 05/15/2022 1213   PROT 6.4 06/07/2021 1140   ALBUMIN 2.1 (L) 05/31/2022 0353   ALBUMIN 4.2 06/07/2021 1140   AST 22 05/15/2022 1213   ALT 14 05/15/2022 1213   ALKPHOS 79 05/15/2022 1213   BILITOT 0.8 05/15/2022 1213   BILITOT 0.4 06/07/2021 1140   GFRNONAA 27 (L) 05/31/2022 0353   GFRAA >60 11/11/2019 1200    Assessment: 1.  Nausea and vomiting: Previous EGD with gastritis and duodenitis, gastric emptying study negative/normal, patient better after increasing Pantoprazole to 40 mg twice daily 2.  Epigastric pain 3.  Diarrhea  Plan: 1.  Patient continues to do well on her current regimen.  Recommend she continue Pantoprazole 40 mg twice daily, Zofran 4 mg as needed, Pepcid 10 mg every morning and Carafate 4 times daily.  She will call if she needs any refills.  She can have a year of refills from today's date. 2.  Patient to follow in clinic with [place] as needed or in a year for refills.  Ellouise Newer, PA-C [facility] 06/24/2022, 10:16 AM  Cc: Martyn Malay, MD

## 2022-06-27 NOTE — Telephone Encounter (Signed)
Reviewed and agree.

## 2022-06-27 NOTE — Telephone Encounter (Signed)
Spoke with daughter and confirmed with her and the patient that they are ok with cancelling services with [facility] if [facility] can offer custodial care.  Referral faxed to [facility] to review and I left a message with Jenny Reichmann at [place].  Phillip Sandler,CMA

## 2022-06-28 NOTE — Telephone Encounter (Signed)
Patient is in the process of switching [facility] for broader services.

## 2022-06-30 DIAGNOSIS — I69354 Hemiplegia and hemiparesis following cerebral infarction affecting left non-dominant side: Secondary | ICD-10-CM | POA: Diagnosis not present

## 2022-06-30 DIAGNOSIS — I69392 Facial weakness following cerebral infarction: Secondary | ICD-10-CM | POA: Diagnosis not present

## 2022-06-30 DIAGNOSIS — I129 Hypertensive chronic kidney disease with stage 1 through stage 4 chronic kidney disease, or unspecified chronic kidney disease: Secondary | ICD-10-CM | POA: Diagnosis not present

## 2022-06-30 DIAGNOSIS — E1136 Type 2 diabetes mellitus with diabetic cataract: Secondary | ICD-10-CM | POA: Diagnosis not present

## 2022-06-30 DIAGNOSIS — I69322 Dysarthria following cerebral infarction: Secondary | ICD-10-CM | POA: Diagnosis not present

## 2022-06-30 DIAGNOSIS — E785 Hyperlipidemia, unspecified: Secondary | ICD-10-CM | POA: Diagnosis not present

## 2022-06-30 DIAGNOSIS — N1832 Chronic kidney disease, stage 3b: Secondary | ICD-10-CM | POA: Diagnosis not present

## 2022-06-30 DIAGNOSIS — K209 Esophagitis, unspecified without bleeding: Secondary | ICD-10-CM | POA: Diagnosis not present

## 2022-06-30 DIAGNOSIS — I739 Peripheral vascular disease, unspecified: Secondary | ICD-10-CM | POA: Diagnosis not present

## 2022-06-30 DIAGNOSIS — M1991 Primary osteoarthritis, unspecified site: Secondary | ICD-10-CM | POA: Diagnosis not present

## 2022-07-04 ENCOUNTER — Ambulatory Visit: Payer: PPO

## 2022-07-04 DIAGNOSIS — I679 Cerebrovascular disease, unspecified: Secondary | ICD-10-CM | POA: Diagnosis not present

## 2022-07-05 ENCOUNTER — Encounter: Payer: Self-pay | Admitting: Family Medicine

## 2022-07-05 LAB — CUP PACEART REMOTE DEVICE CHECK
Date Time Interrogation Session: 20240204210111
Implantable Pulse Generator Implant Date: 20240102
Pulse Gen Serial Number: 511019975

## 2022-07-07 ENCOUNTER — Telehealth: Payer: Self-pay | Admitting: Family Medicine

## 2022-07-07 NOTE — Telephone Encounter (Signed)
Dear Triage team Can you call Mrs Kleman's daughter and see of we can get her in to be seen regarding her lower extremity swelling. I sent her this my chart message  in response to her request for lasix. If we are short on appts, I can see her. Text me if we need to do this as I will have to creat a template etc.  THANKS! Dorcas Mcmurray  This is what I sent to her daughter: I am covering Dr. Saul Fordyce messages while she is out. I do not see that Ms. Weesner has ever been on lasix before. She also has elevated kidney function on last lab work and lasix could make that worse. I am going to see if our triage nurse can call you tomorrow--it would probably be best if we see you in clinic. I can arrange to have you seen tomorrow. Please expect a call from our office tomorrow in the morning.

## 2022-07-08 NOTE — Telephone Encounter (Signed)
Called patient's daughter. Daughter is unable to bring her in for appointment today. She is asking that we schedule her for Monday. Scheduled on Monday morning with Dr. Nancy Fetter.   She denies chest pain or SHOB.   ED precautions discussed over the weekend.   Talbot Grumbling, RN

## 2022-07-11 ENCOUNTER — Ambulatory Visit: Payer: PPO | Admitting: Family Medicine

## 2022-07-11 ENCOUNTER — Ambulatory Visit (INDEPENDENT_AMBULATORY_CARE_PROVIDER_SITE_OTHER): Payer: PPO | Admitting: Family Medicine

## 2022-07-11 ENCOUNTER — Encounter: Payer: Self-pay | Admitting: Family Medicine

## 2022-07-11 VITALS — BP 206/73 | HR 76 | Ht 64.0 in | Wt 154.8 lb

## 2022-07-11 DIAGNOSIS — I10 Essential (primary) hypertension: Secondary | ICD-10-CM | POA: Diagnosis not present

## 2022-07-11 DIAGNOSIS — R600 Localized edema: Secondary | ICD-10-CM

## 2022-07-11 NOTE — Patient Instructions (Addendum)
It was nice seeing you today!  For leg swelling, I recommend elevating the legs is much as possible, trying some compression socks or stockings, and reducing sodium/salt intake in your diet.  You may benefit from some Lasix, we will check the blood work today and we will be in touch.  Stay well, Zola Button, MD [place] [phone]  --  Make sure to check out at the front desk before you leave today.  Please arrive at least 15 minutes prior to your scheduled appointments.  If you had blood work today, I will send you a MyChart message or a letter if results are normal. Otherwise, I will give you a call.  If you had a referral placed, they will call you to set up an appointment. Please give [place] a call if you don't hear back in the next 2 weeks.  If you need additional refills before your next appointment, please call your pharmacy first.

## 2022-07-11 NOTE — Progress Notes (Signed)
    SUBJECTIVE:   CHIEF COMPLAINT / HPI:  Chief Complaint  Patient presents with   Medication discussion    Patient's daughter sent message requesting furosemide prescription due to lower extremity swelling, apparently not having any chest pain or shortness of breath.  Reports leg swelling x 1 week No chest pain, SOB Some discomfort in her legs as well  Home BP readings 150s/60s She did not take her antihypertensives today because she forgot  Daughter had some questions about her medications: clopidogrel, enalapril, ?maleate, sucralfate, Myrbetriq, bisulfate  PERTINENT  PMH / PSH: HTN, CVD, peripheral vascular disease, T2DM, CKD Recent hospital admission for intracerebral hemorrhage 05/15/2022-05/31/2022 TTE during admission showed EF 55 to 40%, grade 2 diastolic dysfunction, trivial mitral valve regurgitation but otherwise no significant valvular abnormalities Low albumin 2.1 prior to discharge Negative DVT ultrasound 06/03/2018 for bilateral  Patient Care Team: Martyn Malay, MD as PCP - General (Family Medicine)   OBJECTIVE:   BP (!) 206/73   Pulse 76   Ht 5\' 4"  (1.626 m)   Wt 154 lb 12.8 oz (70.2 kg)   SpO2 100%   BMI 26.57 kg/m   Physical Exam Constitutional:      General: She is not in acute distress. Cardiovascular:     Rate and Rhythm: Normal rate and regular rhythm.     Heart sounds: Murmur heard.     Comments: 2/6 systolic murmur best heard in the right upper sternal border Pulmonary:     Effort: Pulmonary effort is normal. No respiratory distress.     Breath sounds: Normal breath sounds.  Musculoskeletal:     Cervical back: Neck supple.     Right lower leg: Edema present.     Left lower leg: Edema present.     Comments: 2+ pitting edema in bilateral lower extremities with some venous stasis changes  Neurological:     Mental Status: She is alert.         {Show previous vital signs (optional):23777}    ASSESSMENT/PLAN:   Lower extremity  edema Possibly chronic venous insufficiency or CHF with recent HFpEF/diastolic dysfunction noted on TTE.  Could also be related to low albumin state.  DVT ultrasound negative for DVT. - check CBC, CMP, BNP - consider starting furosemide pending results  HTN (hypertension) Difficult to Control with Isolated Systolic HTN  Significantly elevated today likely secondary to medication nonadherence, forgot to take medications this morning.  Ambulatory BP readings appear to be reasonably controlled.  Notably significant widened pulse pressures.  Could consider ACE/ARB pending lab results.    Return if symptoms worsen or fail to improve.   Lauren Button, MD [place]

## 2022-07-11 NOTE — Telephone Encounter (Signed)
Patient rescheduled for this afternoon per daughter request.

## 2022-07-11 NOTE — Assessment & Plan Note (Addendum)
Significantly elevated today likely secondary to medication nonadherence, forgot to take medications this morning.  Ambulatory BP readings appear to be reasonably controlled.  Notably significant widened pulse pressures.  Could consider ACE/ARB pending lab results.

## 2022-07-11 NOTE — Assessment & Plan Note (Signed)
Possibly chronic venous insufficiency or CHF with recent HFpEF/diastolic dysfunction noted on TTE.  Could also be related to low albumin state.  DVT ultrasound negative for DVT. - check CBC, CMP, BNP - consider starting furosemide pending results

## 2022-07-12 LAB — COMPREHENSIVE METABOLIC PANEL
ALT: 6 IU/L (ref 0–32)
AST: 10 IU/L (ref 0–40)
Albumin/Globulin Ratio: 1.1 — ABNORMAL LOW (ref 1.2–2.2)
Albumin: 3.1 g/dL — ABNORMAL LOW (ref 3.8–4.8)
Alkaline Phosphatase: 124 IU/L — ABNORMAL HIGH (ref 44–121)
BUN/Creatinine Ratio: 10 — ABNORMAL LOW (ref 12–28)
BUN: 13 mg/dL (ref 8–27)
Bilirubin Total: 0.4 mg/dL (ref 0.0–1.2)
CO2: 22 mmol/L (ref 20–29)
Calcium: 8.4 mg/dL — ABNORMAL LOW (ref 8.7–10.3)
Chloride: 99 mmol/L (ref 96–106)
Creatinine, Ser: 1.3 mg/dL — ABNORMAL HIGH (ref 0.57–1.00)
Globulin, Total: 2.9 g/dL (ref 1.5–4.5)
Glucose: 96 mg/dL (ref 70–99)
Potassium: 3.4 mmol/L — ABNORMAL LOW (ref 3.5–5.2)
Sodium: 138 mmol/L (ref 134–144)
Total Protein: 6 g/dL (ref 6.0–8.5)
eGFR: 42 mL/min/{1.73_m2} — ABNORMAL LOW (ref >59)

## 2022-07-12 LAB — CBC
Hematocrit: 30.6 % — ABNORMAL LOW (ref 34.0–46.6)
Hemoglobin: 9.6 g/dL — ABNORMAL LOW (ref 11.1–15.9)
MCH: 25.3 pg — ABNORMAL LOW (ref 26.6–33.0)
MCHC: 31.4 g/dL — ABNORMAL LOW (ref 31.5–35.7)
MCV: 81 fL (ref 79–97)
Platelets: 390 10*3/uL (ref 150–450)
RBC: 3.8 x10E6/uL (ref 3.77–5.28)
RDW: 14.6 % (ref 11.7–15.4)
WBC: 6.8 10*3/uL (ref 3.4–10.8)

## 2022-07-12 LAB — BRAIN NATRIURETIC PEPTIDE: BNP: 575.5 pg/mL — ABNORMAL HIGH (ref 0.0–100.0)

## 2022-07-14 ENCOUNTER — Telehealth: Payer: Self-pay | Admitting: Family Medicine

## 2022-07-14 MED ORDER — FUROSEMIDE 20 MG PO TABS: 20.0000 mg | ORAL_TABLET | Freq: Every day | ORAL | 0 refills | Status: DC | PRN

## 2022-07-14 MED ORDER — POTASSIUM CHLORIDE CRYS ER 20 MEQ PO TBCR: 20.0000 meq | EXTENDED_RELEASE_TABLET | Freq: Every day | ORAL | 0 refills | Status: DC | PRN

## 2022-07-14 NOTE — Telephone Encounter (Signed)
I called patient and daughter regarding recent lab results.  Kidney function is improving.  Potassium mildly low.  She does have elevated BNP 575 which does suggest some fluid overload/CHF component.  I think she would benefit from Lasix.  I have sent in furosemide 20 mg as needed for swelling.  Also advised to take potassium supplement when giving furosemide.  Advised to monitor symptoms of chest pain, shortness of breath.  Also advised to monitor weight at least a few times a week.  Advised to follow-up in 2 weeks for reassessment.

## 2022-07-21 ENCOUNTER — Telehealth: Payer: Self-pay | Admitting: Family Medicine

## 2022-07-21 NOTE — Telephone Encounter (Signed)
Called patient to schedule Medicare Annual Wellness Visit (AWV). Left message for patient to call back and schedule Medicare Annual Wellness Visit (AWV).  Last date of AWV: 08/29/2010  Please schedule an appointment at any time with [place], [place] .  If any questions, please contact me at [phone].    Thank you,  [place] Direct dial  [phone]

## 2022-07-29 ENCOUNTER — Inpatient Hospital Stay: Payer: PPO | Admitting: Diagnostic Neuroimaging

## 2022-08-08 ENCOUNTER — Ambulatory Visit: Payer: PPO

## 2022-08-17 NOTE — Progress Notes (Signed)
[facility] Loop Recorder  

## 2022-08-18 ENCOUNTER — Ambulatory Visit (INDEPENDENT_AMBULATORY_CARE_PROVIDER_SITE_OTHER): Payer: PPO

## 2022-08-18 DIAGNOSIS — I639 Cerebral infarction, unspecified: Secondary | ICD-10-CM

## 2022-08-18 LAB — CUP PACEART REMOTE DEVICE CHECK
Date Time Interrogation Session: 20240321095045
Implantable Pulse Generator Implant Date: 20240102
Pulse Gen Serial Number: 511019975

## 2022-09-12 ENCOUNTER — Ambulatory Visit: Payer: PPO

## 2022-09-19 ENCOUNTER — Ambulatory Visit (INDEPENDENT_AMBULATORY_CARE_PROVIDER_SITE_OTHER): Payer: PPO

## 2022-09-19 DIAGNOSIS — I639 Cerebral infarction, unspecified: Secondary | ICD-10-CM

## 2022-09-20 LAB — CUP PACEART REMOTE DEVICE CHECK
Date Time Interrogation Session: 20240422215003
Implantable Pulse Generator Implant Date: 20240102
Pulse Gen Serial Number: 511019975

## 2022-09-21 NOTE — Progress Notes (Signed)
[facility] Summary Report / Loop Recorder 

## 2022-09-27 ENCOUNTER — Other Ambulatory Visit: Payer: Self-pay

## 2022-09-27 MED ORDER — FUROSEMIDE 20 MG PO TABS: 20.0000 mg | ORAL_TABLET | Freq: Every day | ORAL | 0 refills | Status: DC | PRN

## 2022-09-27 MED ORDER — HYDRALAZINE HCL 50 MG PO TABS: 50.0000 mg | ORAL_TABLET | Freq: Two times a day (BID) | ORAL | 1 refills | Status: DC

## 2022-10-13 ENCOUNTER — Other Ambulatory Visit: Payer: Self-pay

## 2022-10-14 MED ORDER — SERTRALINE HCL 25 MG PO TABS: 25.0000 mg | ORAL_TABLET | Freq: Every day | ORAL | 3 refills | Status: DC

## 2022-10-17 ENCOUNTER — Ambulatory Visit: Payer: PPO

## 2022-10-18 ENCOUNTER — Other Ambulatory Visit: Payer: Self-pay

## 2022-10-18 MED ORDER — FUROSEMIDE 20 MG PO TABS: 20.0000 mg | ORAL_TABLET | Freq: Every day | ORAL | 0 refills | Status: DC | PRN

## 2022-10-20 ENCOUNTER — Ambulatory Visit (INDEPENDENT_AMBULATORY_CARE_PROVIDER_SITE_OTHER): Payer: PPO

## 2022-10-20 DIAGNOSIS — I639 Cerebral infarction, unspecified: Secondary | ICD-10-CM | POA: Diagnosis not present

## 2022-10-20 LAB — CUP PACEART REMOTE DEVICE CHECK
Date Time Interrogation Session: 20240522220151
Implantable Pulse Generator Implant Date: 20240102
Pulse Gen Serial Number: 511019975

## 2022-10-21 ENCOUNTER — Encounter: Payer: Self-pay | Admitting: Family Medicine

## 2022-10-21 MED ORDER — SERTRALINE HCL 25 MG PO TABS: 25.0000 mg | ORAL_TABLET | Freq: Every day | ORAL | 3 refills | Status: DC

## 2022-10-25 NOTE — Progress Notes (Signed)
[facility] Loop Recorder 

## 2022-11-03 ENCOUNTER — Ambulatory Visit: Payer: PPO | Admitting: Family Medicine

## 2022-11-03 NOTE — Progress Notes (Deleted)
  SUBJECTIVE:   CHIEF COMPLAINT / HPI:   Concern for leg swelling and discoloration -Pt on lasix 20mg  qd prn, last seen in Feb 2024 for lower extremity edema. BNP elevated at that time, instructed to use lasix. Most recent echo in 2023, EF 55-60%, G2DD -DVT U/S negative in 05/2022 -Loop recorder in place ***  PERTINENT  PMH / PSH: ***  Past Medical History:  Diagnosis Date   Anxiety disorder    Arthritis    hands, hips   C. 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   PVD (peripheral vascular disease) (HCC)    Seasonal allergies    Stroke (H[facility])    Type 2 diabetes mellitus with diabetic cataract ([facility]) 2002   Vitamin B12 deficiency     OBJECTIVE:  There were no vitals taken for this visit.  General: NAD, pleasant, able to participate in exam Cardiac: RRR, no murmurs auscultated Respiratory: CTAB, normal WOB Abdomen: soft, non-tender, non-distended, normoactive bowel sounds Extremities: warm and well perfused, no edema or cyanosis Skin: warm and dry, no rashes noted Neuro: alert, no obvious focal deficits, speech normal Psych: Normal affect and mood  ASSESSMENT/PLAN:   There are no diagnoses linked to this encounter. No orders of the defined types were placed in this encounter.  No follow-ups on file.  Vonna Drafts, MD [facility]

## 2022-11-09 ENCOUNTER — Other Ambulatory Visit: Payer: Self-pay

## 2022-11-09 ENCOUNTER — Encounter: Payer: Self-pay | Admitting: Family Medicine

## 2022-11-09 ENCOUNTER — Ambulatory Visit (INDEPENDENT_AMBULATORY_CARE_PROVIDER_SITE_OTHER): Payer: PPO | Admitting: Family Medicine

## 2022-11-09 VITALS — BP 156/95 | HR 70 | Wt 153.4 lb

## 2022-11-09 DIAGNOSIS — I10 Essential (primary) hypertension: Secondary | ICD-10-CM

## 2022-11-09 DIAGNOSIS — F39 Unspecified mood [affective] disorder: Secondary | ICD-10-CM

## 2022-11-09 DIAGNOSIS — R600 Localized edema: Secondary | ICD-10-CM

## 2022-11-09 DIAGNOSIS — M25511 Pain in right shoulder: Secondary | ICD-10-CM | POA: Insufficient documentation

## 2022-11-09 MED ORDER — HYDROCORTISONE 2.5 % EX OINT: TOPICAL_OINTMENT | Freq: Two times a day (BID) | CUTANEOUS | 3 refills | Status: AC

## 2022-11-09 NOTE — Assessment & Plan Note (Signed)
Has mild stasis dermatitis.  Minimal edema now and no signs of infection.  Will try low dose HC ointment.  Suggested taking lasix as needed

## 2022-11-09 NOTE — Progress Notes (Signed)
    SUBJECTIVE:   CHIEF COMPLAINT / HPI:   Shoulder Pain Tripped on bed sheets on 6/9 Saturday and fell on R shoulder and back.  Did not hit head or lose awareness.  Now R shoulder hurts some   Legs Intermittent redness and itching Using Argan oil and anti itch medication OTC.  Takes lasix and K most days.    Depression  Feels more down and argues with her daughter.  Has not been taking sertraline.  Esophagitis Not taking any GI medications for weeks.  No pain or burning or bleeding  Accompanied by her daughter today     OBJECTIVE:   BP (!) 156/95   Pulse 70   Wt 153 lb 6.4 oz (69.6 kg)   SpO2 99%   BMI 26.33 kg/m   R Shoulder - nearly FROM with mild pain, distal grip strong with good pulses.    On palpation mild diffuse soreness without crepitus or deformity Neck:  No deformities, thyromegaly, masses, or tenderness noted.   Supple with full range of motion without pain. Able to stand walk turn without cane  Alert seems to know her medications, able to use her phone to show me pictures of her legs Intermittently crying but recovers on her own  Legs - mild erythema anterior shins without skin breakdown  ASSESSMENT/PLAN:   Acute pain of right shoulder Assessment & Plan: Consistent with strain contusion.  With good range of motion very unlikely fracture.  Discussed importance of keeping it moving.   Lower extremity edema Assessment & Plan: Has mild stasis dermatitis.  Minimal edema now and no signs of infection.  Will try low dose HC ointment.  Suggested taking lasix as needed    Mood disorder [facility]) Assessment & Plan: Unclear if major depression or anxiety vs grief at her current medial situation and pending move.  Also likely some lability given CVA.  She will restart sertraline.  We discussed counseling and she will consider    Primary hypertension Assessment & Plan: Not at goal.  Likely due to adherence (did not take medications this AM)  Her daughter lays  out an AM and PM pillboxes and she reports taking most days.  Encouraged her to take these regularly    Other orders -     Hydrocortisone; Apply topically 2 (two) times daily. On red areas on legs  Dispense: 60 g; Refill: 3     Patient Instructions  Good to see you today - Thank you for coming in  Things we discussed today:  Shoulder Use tylenol three times a day as needed Exercise range of motion three times a day Ice or heat for pain Let Dr Manson Passey know if not better  Legs Try the hydrocortisone twice a day and keep the legs elevated  Mood Take the sertraline every day Let Dr Manson Passey know if you want to start counseling  BP Take your medications every day  Continue not taking the stomach medications  Please always bring your medication bottles  Keep your appointment with Dr Manson Passey   Lauren Living, Lauren Craig [facility]

## 2022-11-09 NOTE — Assessment & Plan Note (Signed)
Unclear if major depression or anxiety vs grief at her current medial situation and pending move.  Also likely some lability given CVA.  She will restart sertraline.  We discussed counseling and she will consider

## 2022-11-09 NOTE — Assessment & Plan Note (Signed)
Consistent with strain contusion.  With good range of motion very unlikely fracture.  Discussed importance of keeping it moving.

## 2022-11-09 NOTE — Progress Notes (Signed)
[facility] Loop Recorder 

## 2022-11-09 NOTE — Assessment & Plan Note (Signed)
Not at goal.  Likely due to adherence (did not take medications this AM)  Her daughter lays out an AM and PM pillboxes and she reports taking most days.  Encouraged her to take these regularly

## 2022-11-09 NOTE — Patient Instructions (Signed)
Good to see you today - Thank you for coming in  Things we discussed today:  Shoulder Use tylenol three times a day as needed Exercise range of motion three times a day Ice or heat for pain Let Dr Manson Passey know if not better  Legs Try the hydrocortisone twice a day and keep the legs elevated  Mood Take the sertraline every day Let Dr Manson Passey know if you want to start counseling  BP Take your medications every day  Continue not taking the stomach medications  Please always bring your medication bottles  Keep your appointment with Dr Manson Passey

## 2022-11-21 ENCOUNTER — Ambulatory Visit: Payer: PPO

## 2022-12-15 NOTE — Progress Notes (Signed)
    SUBJECTIVE:   CHIEF COMPLAINT: HTN HPI:   Lauren Craig is a 77 y.o.  with history notable for HTN, CVA, chronic GI symptoms (unclear exact cause), and mood disorder presenting for follow up. She is joined by her daughter.    She is moving to [place] in August She is upset about this move but recognizes it is necessary Uses a cane to walk, had a fall 2 weeks ago No LOC or head injury  No pain from fall  She reports a low mood due to leaving her home. No SI/HI. Looking forward to spending time with her family and Charlean Sanfilippo (granddaughter) [phone]  Patient reports her appetite and stomach are doing well. Weight is stable. No recent bouts of nausea/diarrhea.  The patient has taken none of her medications for a few days or more. No HA, vision changes, speech changes.    PERTINENT  PMH / PSH/Family/Social History : CVA, possible cognitive impairment, HTN, well controlled diabetes  OBJECTIVE:   BP (!) 176/70   Pulse 81   Ht 5\' 4"  (1.626 m)   Wt 151 lb 9.6 oz (68.8 kg)   SpO2 99%   BMI 26.02 kg/m   Today's weight:  Last Weight  Most recent update: 12/16/2022 10:31 AM    Weight  68.8 kg (151 lb 9.6 oz)            Review of prior weights: [employer]   12/16/22 1031  Weight: 151 lb 9.6 oz (68.8 kg)    Appropriate, alert to person, place and situation RRR Lungs clear Abdomen is soft  Chronic R facial droop present Mild dysarthria present with mild slurring of speech Ambulates without assistance but slowly  ASSESSMENT/PLAN:   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) Refilled statin and ASA--not taking any of her medications currently  Has a LINQ in place she wants removed Referral to Cardiology for this   Discussed importance of taking medications at ALF Will ask ALF to pass her medications   HTN (hypertension) Difficult to Control with Isolated Systolic HTN  Not at goal due to not taking  medications past few days BMP today Refilled Medications   Vitamin B12 deficiency B12 today    Goals of care, counseling/discussion Discussed at length Patient would now like granddaughter [name] to be POA New AD given to patient  Confirmed DNR/DNI with patient--she would never want intubation or feeding tube  Elevated ALP GGT and repeat testing today   CKD  Repeat BMP today  Follow up in 1-2 months They are to schedule GI visit Nursing facility will check BP Foot exam and referral to ophtho at follow up     Terisa Starr, MD  Family Medicine Teaching Service  [facility]

## 2022-12-16 ENCOUNTER — Other Ambulatory Visit: Payer: Self-pay

## 2022-12-16 ENCOUNTER — Encounter: Payer: Self-pay | Admitting: Family Medicine

## 2022-12-16 ENCOUNTER — Ambulatory Visit (INDEPENDENT_AMBULATORY_CARE_PROVIDER_SITE_OTHER): Payer: PPO | Admitting: Family Medicine

## 2022-12-16 VITALS — BP 176/70 | HR 81 | Ht 64.0 in | Wt 151.6 lb

## 2022-12-16 DIAGNOSIS — Z7189 Other specified counseling: Secondary | ICD-10-CM | POA: Insufficient documentation

## 2022-12-16 DIAGNOSIS — I10 Essential (primary) hypertension: Secondary | ICD-10-CM | POA: Diagnosis not present

## 2022-12-16 DIAGNOSIS — E538 Deficiency of other specified B group vitamins: Secondary | ICD-10-CM

## 2022-12-16 DIAGNOSIS — I63512 Cerebral infarction due to unspecified occlusion or stenosis of left middle cerebral artery: Secondary | ICD-10-CM

## 2022-12-16 DIAGNOSIS — K209 Esophagitis, unspecified without bleeding: Secondary | ICD-10-CM | POA: Diagnosis not present

## 2022-12-16 DIAGNOSIS — F39 Unspecified mood [affective] disorder: Secondary | ICD-10-CM | POA: Diagnosis not present

## 2022-12-16 DIAGNOSIS — I679 Cerebrovascular disease, unspecified: Secondary | ICD-10-CM

## 2022-12-16 MED ORDER — FAMOTIDINE 10 MG PO TABS: 10.0000 mg | ORAL_TABLET | Freq: Every day | ORAL | 3 refills | Status: DC

## 2022-12-16 MED ORDER — SUCRALFATE 1 GM/10ML PO SUSP: 1.0000 g | Freq: Three times a day (TID) | ORAL | 0 refills | Status: DC

## 2022-12-16 MED ORDER — HYDRALAZINE HCL 50 MG PO TABS: 50.0000 mg | ORAL_TABLET | Freq: Two times a day (BID) | ORAL | 1 refills | Status: DC

## 2022-12-16 MED ORDER — CARVEDILOL 12.5 MG PO TABS: 12.5000 mg | ORAL_TABLET | Freq: Two times a day (BID) | ORAL | 1 refills | Status: DC

## 2022-12-16 MED ORDER — EZETIMIBE 10 MG PO TABS
10.0000 mg | ORAL_TABLET | Freq: Every day | ORAL | 3 refills | Status: DC
Start: 2022-12-16 — End: 2023-01-09

## 2022-12-16 MED ORDER — ASPIRIN 81 MG PO TBEC: 81.0000 mg | DELAYED_RELEASE_TABLET | Freq: Every day | ORAL | 12 refills | Status: DC

## 2022-12-16 MED ORDER — AMLODIPINE BESYLATE 10 MG PO TABS
10.0000 mg | ORAL_TABLET | Freq: Every day | ORAL | 3 refills | Status: DC
Start: 2022-12-16 — End: 2023-06-26

## 2022-12-16 MED ORDER — ROSUVASTATIN CALCIUM 40 MG PO TABS: 40.0000 mg | ORAL_TABLET | Freq: Every day | ORAL | 3 refills | Status: DC

## 2022-12-16 MED ORDER — ONDANSETRON 4 MG PO TBDP
4.0000 mg | ORAL_TABLET | Freq: Three times a day (TID) | ORAL | 11 refills | Status: DC | PRN
Start: 2022-12-16 — End: 2024-01-16

## 2022-12-16 MED ORDER — SERTRALINE HCL 25 MG PO TABS: 25.0000 mg | ORAL_TABLET | Freq: Every day | ORAL | 3 refills | Status: DC

## 2022-12-16 MED ORDER — ALBUTEROL SULFATE HFA 108 (90 BASE) MCG/ACT IN AERS: 2.0000 | INHALATION_SPRAY | Freq: Four times a day (QID) | RESPIRATORY_TRACT | 3 refills | Status: DC | PRN

## 2022-12-16 MED ORDER — PANTOPRAZOLE SODIUM 40 MG PO TBEC: DELAYED_RELEASE_TABLET | ORAL | 11 refills | Status: DC

## 2022-12-16 NOTE — Assessment & Plan Note (Signed)
Discussed at length Patient would now like granddaughter [name] to be POA New AD given to patient  Confirmed DNR/DNI with patient--she would never want intubation or feeding tube

## 2022-12-16 NOTE — Assessment & Plan Note (Signed)
Not at goal due to not taking medications past few days BMP today Refilled Medications

## 2022-12-16 NOTE — Patient Instructions (Signed)
It was wonderful to see you today.  Please bring ALL of your medications with you to every visit.   Today we talked about:  --Taking your blood pressure medications as prescribed  - I sent in all of your refills  - You will be called about your Cardiology visit   _I will complete your paperwork and fax    Please follow up in 3 months   Thank you for choosing [facility].   Please call [phone] with any questions about today's appointment.  Please be sure to schedule follow up at the front  desk before you leave today.   Terisa Starr, MD  Family Medicine

## 2022-12-16 NOTE — Assessment & Plan Note (Signed)
B12 today 

## 2022-12-16 NOTE — Assessment & Plan Note (Signed)
Refilled statin and ASA--not taking any of her medications currently  Has a LINQ in place she wants removed Referral to Cardiology for this   Discussed importance of taking medications at ALF Will ask ALF to pass her medications

## 2022-12-17 LAB — BASIC METABOLIC PANEL
BUN/Creatinine Ratio: 13 (ref 12–28)
BUN: 21 mg/dL (ref 8–27)
CO2: 17 mmol/L — ABNORMAL LOW (ref 20–29)
Calcium: 9.1 mg/dL (ref 8.7–10.3)
Chloride: 109 mmol/L — ABNORMAL HIGH (ref 96–106)
Creatinine, Ser: 1.58 mg/dL — ABNORMAL HIGH (ref 0.57–1.00)
Glucose: 96 mg/dL (ref 70–99)
Potassium: 5 mmol/L (ref 3.5–5.2)
Sodium: 140 mmol/L (ref 134–144)
eGFR: 33 mL/min/{1.73_m2} — ABNORMAL LOW (ref >59)

## 2022-12-17 LAB — CBC
Hematocrit: 35.8 % (ref 34.0–46.6)
Hemoglobin: 11.1 g/dL (ref 11.1–15.9)
MCH: 25.9 pg — ABNORMAL LOW (ref 26.6–33.0)
MCHC: 31 g/dL — ABNORMAL LOW (ref 31.5–35.7)
MCV: 84 fL (ref 79–97)
Platelets: 231 10*3/uL (ref 150–450)
RBC: 4.28 x10E6/uL (ref 3.77–5.28)
RDW: 16.8 % — ABNORMAL HIGH (ref 11.7–15.4)
WBC: 6.7 10*3/uL (ref 3.4–10.8)

## 2022-12-17 LAB — HEPATIC FUNCTION PANEL
ALT: 10 IU/L (ref 0–32)
AST: 13 IU/L (ref 0–40)
Albumin: 3.8 g/dL (ref 3.8–4.8)
Alkaline Phosphatase: 99 IU/L (ref 44–121)
Bilirubin Total: 0.2 mg/dL (ref 0.0–1.2)
Bilirubin, Direct: <0.1 mg/dL (ref 0.00–0.40)
Total Protein: 6.3 g/dL (ref 6.0–8.5)

## 2022-12-17 LAB — GAMMA GT: GGT: 11 IU/L (ref 0–60)

## 2022-12-17 LAB — LIPID PANEL
Chol/HDL Ratio: 4.7 ratio — ABNORMAL HIGH (ref 0.0–4.4)
Cholesterol, Total: 225 mg/dL — ABNORMAL HIGH (ref 100–199)
HDL: 48 mg/dL (ref >39)
LDL Chol Calc (NIH): 145 mg/dL — ABNORMAL HIGH (ref 0–99)
Triglycerides: 177 mg/dL — ABNORMAL HIGH (ref 0–149)
VLDL Cholesterol Cal: 32 mg/dL (ref 5–40)

## 2022-12-17 LAB — HEMOGLOBIN A1C
Est. average glucose Bld gHb Est-mCnc: 140 mg/dL
Hgb A1c MFr Bld: 6.5 % — ABNORMAL HIGH (ref 4.8–5.6)

## 2022-12-17 LAB — VITAMIN B12: Vitamin B-12: 248 pg/mL (ref 232–1245)

## 2022-12-17 LAB — FERRITIN: Ferritin: 80 ng/mL (ref 15–150)

## 2022-12-19 ENCOUNTER — Encounter: Payer: Self-pay | Admitting: Family Medicine

## 2022-12-22 ENCOUNTER — Ambulatory Visit: Payer: PPO

## 2022-12-22 DIAGNOSIS — I639 Cerebral infarction, unspecified: Secondary | ICD-10-CM | POA: Diagnosis not present

## 2022-12-26 ENCOUNTER — Ambulatory Visit: Payer: PPO

## 2022-12-26 ENCOUNTER — Telehealth: Payer: Self-pay | Admitting: Family Medicine

## 2022-12-26 NOTE — Telephone Encounter (Signed)
Reviewed, completed, and signed form.  Note routed to RN team inbasket and placed completed form in RN Wall pocket in the front office.  Carina M Brown, MD  

## 2022-12-26 NOTE — Telephone Encounter (Signed)
Patient called and informed that forms are ready for pick up. Copy made and placed in batch scanning. Original placed at front desk for pick up.   [name] C Pipkin, RN  

## 2023-01-03 NOTE — Progress Notes (Signed)
[facility] Summary Report / Loop Recorder 

## 2023-01-06 ENCOUNTER — Telehealth: Payer: Self-pay | Admitting: Family Medicine

## 2023-01-06 NOTE — Telephone Encounter (Signed)
Messaged by daughter in daughter's chart: Good morning,   I hope you and the family are well.    Today, Lauren Craig is moving.    At some point, I would ask that you DC her prn meds because [facility] says with all the meds listed, she is level 3.    With her BP meds and others she is really taking 5. More than that makes her level 2, and that ups her cost as well.    We'll need to speak with you about what she is taking.    Until then, have a blessed day!   [name]   Messaged back.

## 2023-01-09 ENCOUNTER — Other Ambulatory Visit: Payer: Self-pay | Admitting: Family Medicine

## 2023-01-09 DIAGNOSIS — F330 Major depressive disorder, recurrent, mild: Secondary | ICD-10-CM | POA: Diagnosis not present

## 2023-01-09 NOTE — Progress Notes (Signed)
Medication list updated. Nursing--could you please fax to [facility]?   Standing medications should be   Amlodipine 10 mg Carvedilol 12.5 mg BID Hydralazine 50 mg BID Crestor 40 mg nightly OTC ASA 81 daily   Terisa Starr, MD  Family Medicine Teaching Service

## 2023-01-12 ENCOUNTER — Encounter: Payer: Self-pay | Admitting: Pharmacist

## 2023-01-12 DIAGNOSIS — E785 Hyperlipidemia, unspecified: Secondary | ICD-10-CM | POA: Diagnosis not present

## 2023-01-12 DIAGNOSIS — I10 Essential (primary) hypertension: Secondary | ICD-10-CM | POA: Diagnosis not present

## 2023-01-12 DIAGNOSIS — K219 Gastro-esophageal reflux disease without esophagitis: Secondary | ICD-10-CM | POA: Diagnosis not present

## 2023-01-12 DIAGNOSIS — F329 Major depressive disorder, single episode, unspecified: Secondary | ICD-10-CM | POA: Diagnosis not present

## 2023-01-12 NOTE — Progress Notes (Signed)
Adherence check-in. Seems adherent based on fill-dates in Dr.First for ezetimibe and rosuvastatin on 12/16/22 for 90 day supplies.

## 2023-01-13 DIAGNOSIS — D649 Anemia, unspecified: Secondary | ICD-10-CM | POA: Diagnosis not present

## 2023-01-13 DIAGNOSIS — E119 Type 2 diabetes mellitus without complications: Secondary | ICD-10-CM | POA: Diagnosis not present

## 2023-01-17 DIAGNOSIS — F339 Major depressive disorder, recurrent, unspecified: Secondary | ICD-10-CM | POA: Diagnosis not present

## 2023-01-19 DIAGNOSIS — D649 Anemia, unspecified: Secondary | ICD-10-CM | POA: Diagnosis not present

## 2023-01-19 DIAGNOSIS — B370 Candidal stomatitis: Secondary | ICD-10-CM | POA: Diagnosis not present

## 2023-01-19 DIAGNOSIS — N189 Chronic kidney disease, unspecified: Secondary | ICD-10-CM | POA: Diagnosis not present

## 2023-01-23 ENCOUNTER — Ambulatory Visit (INDEPENDENT_AMBULATORY_CARE_PROVIDER_SITE_OTHER): Payer: PPO

## 2023-01-23 DIAGNOSIS — I639 Cerebral infarction, unspecified: Secondary | ICD-10-CM | POA: Diagnosis not present

## 2023-01-23 LAB — CUP PACEART REMOTE DEVICE CHECK
Date Time Interrogation Session: 20240824220728
Implantable Pulse Generator Implant Date: 20240102
Pulse Gen Serial Number: 511019975

## 2023-01-27 DIAGNOSIS — I69311 Memory deficit following cerebral infarction: Secondary | ICD-10-CM | POA: Diagnosis not present

## 2023-01-27 DIAGNOSIS — I639 Cerebral infarction, unspecified: Secondary | ICD-10-CM | POA: Diagnosis not present

## 2023-01-27 DIAGNOSIS — I69322 Dysarthria following cerebral infarction: Secondary | ICD-10-CM | POA: Diagnosis not present

## 2023-01-31 ENCOUNTER — Ambulatory Visit: Payer: PPO

## 2023-02-01 DIAGNOSIS — I10 Essential (primary) hypertension: Secondary | ICD-10-CM | POA: Diagnosis not present

## 2023-02-01 NOTE — Progress Notes (Signed)
[facility] Loop Recorder  

## 2023-02-02 DIAGNOSIS — I10 Essential (primary) hypertension: Secondary | ICD-10-CM | POA: Diagnosis not present

## 2023-02-02 DIAGNOSIS — M25473 Effusion, unspecified ankle: Secondary | ICD-10-CM | POA: Diagnosis not present

## 2023-02-09 DIAGNOSIS — N189 Chronic kidney disease, unspecified: Secondary | ICD-10-CM | POA: Diagnosis not present

## 2023-02-09 DIAGNOSIS — D649 Anemia, unspecified: Secondary | ICD-10-CM | POA: Diagnosis not present

## 2023-02-09 DIAGNOSIS — M25473 Effusion, unspecified ankle: Secondary | ICD-10-CM | POA: Diagnosis not present

## 2023-02-09 DIAGNOSIS — I10 Essential (primary) hypertension: Secondary | ICD-10-CM | POA: Diagnosis not present

## 2023-02-10 DIAGNOSIS — I10 Essential (primary) hypertension: Secondary | ICD-10-CM | POA: Diagnosis not present

## 2023-02-10 DIAGNOSIS — D649 Anemia, unspecified: Secondary | ICD-10-CM | POA: Diagnosis not present

## 2023-02-10 DIAGNOSIS — F330 Major depressive disorder, recurrent, mild: Secondary | ICD-10-CM | POA: Diagnosis not present

## 2023-02-16 DIAGNOSIS — D509 Iron deficiency anemia, unspecified: Secondary | ICD-10-CM | POA: Diagnosis not present

## 2023-02-16 DIAGNOSIS — E538 Deficiency of other specified B group vitamins: Secondary | ICD-10-CM | POA: Diagnosis not present

## 2023-02-21 DIAGNOSIS — F339 Major depressive disorder, recurrent, unspecified: Secondary | ICD-10-CM | POA: Diagnosis not present

## 2023-02-22 LAB — CUP PACEART REMOTE DEVICE CHECK
Date Time Interrogation Session: 20240924220731
Implantable Pulse Generator Implant Date: 20240102
Pulse Gen Serial Number: 511019975

## 2023-02-23 ENCOUNTER — Ambulatory Visit (INDEPENDENT_AMBULATORY_CARE_PROVIDER_SITE_OTHER): Payer: PPO

## 2023-02-23 DIAGNOSIS — I10 Essential (primary) hypertension: Secondary | ICD-10-CM | POA: Diagnosis not present

## 2023-02-23 DIAGNOSIS — I639 Cerebral infarction, unspecified: Secondary | ICD-10-CM | POA: Diagnosis not present

## 2023-02-23 DIAGNOSIS — M159 Polyosteoarthritis, unspecified: Secondary | ICD-10-CM | POA: Diagnosis not present

## 2023-02-23 DIAGNOSIS — R609 Edema, unspecified: Secondary | ICD-10-CM | POA: Diagnosis not present

## 2023-02-24 DIAGNOSIS — I10 Essential (primary) hypertension: Secondary | ICD-10-CM | POA: Diagnosis not present

## 2023-03-02 DIAGNOSIS — I10 Essential (primary) hypertension: Secondary | ICD-10-CM | POA: Diagnosis not present

## 2023-03-02 DIAGNOSIS — D509 Iron deficiency anemia, unspecified: Secondary | ICD-10-CM | POA: Diagnosis not present

## 2023-03-06 ENCOUNTER — Ambulatory Visit: Payer: PPO

## 2023-03-07 NOTE — Progress Notes (Signed)
[facility] Summary Report / Loop Recorder 

## 2023-03-15 DIAGNOSIS — F330 Major depressive disorder, recurrent, mild: Secondary | ICD-10-CM | POA: Diagnosis not present

## 2023-03-16 DIAGNOSIS — I1A0 Resistant hypertension: Secondary | ICD-10-CM | POA: Diagnosis not present

## 2023-03-16 DIAGNOSIS — R21 Rash and other nonspecific skin eruption: Secondary | ICD-10-CM | POA: Diagnosis not present

## 2023-03-16 DIAGNOSIS — K219 Gastro-esophageal reflux disease without esophagitis: Secondary | ICD-10-CM | POA: Diagnosis not present

## 2023-03-17 DIAGNOSIS — E559 Vitamin D deficiency, unspecified: Secondary | ICD-10-CM | POA: Diagnosis not present

## 2023-03-23 DIAGNOSIS — F330 Major depressive disorder, recurrent, mild: Secondary | ICD-10-CM | POA: Diagnosis not present

## 2023-03-23 DIAGNOSIS — R21 Rash and other nonspecific skin eruption: Secondary | ICD-10-CM | POA: Diagnosis not present

## 2023-03-23 DIAGNOSIS — I10 Essential (primary) hypertension: Secondary | ICD-10-CM | POA: Diagnosis not present

## 2023-03-23 DIAGNOSIS — I1A0 Resistant hypertension: Secondary | ICD-10-CM | POA: Diagnosis not present

## 2023-03-24 DIAGNOSIS — N390 Urinary tract infection, site not specified: Secondary | ICD-10-CM | POA: Diagnosis not present

## 2023-03-27 ENCOUNTER — Ambulatory Visit (INDEPENDENT_AMBULATORY_CARE_PROVIDER_SITE_OTHER): Payer: PPO

## 2023-03-27 DIAGNOSIS — I639 Cerebral infarction, unspecified: Secondary | ICD-10-CM | POA: Diagnosis not present

## 2023-03-27 DIAGNOSIS — D649 Anemia, unspecified: Secondary | ICD-10-CM | POA: Diagnosis not present

## 2023-03-28 DIAGNOSIS — F339 Major depressive disorder, recurrent, unspecified: Secondary | ICD-10-CM | POA: Diagnosis not present

## 2023-03-29 LAB — CUP PACEART REMOTE DEVICE CHECK
Date Time Interrogation Session: 20241027225841
Implantable Pulse Generator Implant Date: 20240102
Pulse Gen Serial Number: 511019975

## 2023-03-30 ENCOUNTER — Encounter: Payer: Self-pay | Admitting: Family Medicine

## 2023-03-30 ENCOUNTER — Telehealth: Payer: Self-pay | Admitting: Family Medicine

## 2023-03-30 DIAGNOSIS — D649 Anemia, unspecified: Secondary | ICD-10-CM | POA: Diagnosis not present

## 2023-03-30 DIAGNOSIS — I10 Essential (primary) hypertension: Secondary | ICD-10-CM | POA: Diagnosis not present

## 2023-03-30 DIAGNOSIS — F330 Major depressive disorder, recurrent, mild: Secondary | ICD-10-CM | POA: Diagnosis not present

## 2023-03-30 NOTE — Telephone Encounter (Signed)
Letter in letters tab to be sent to patient's assisted living facility for her to be able to take her over-the-counter supplements from her own room.  Please call daughter and let her know that this letter is ready.  Please identify how she would like to obtain a letter.

## 2023-03-31 NOTE — Telephone Encounter (Signed)
Called daughter regarding letter being ready for pick up.   She asks that letter be sent via mychart. I have sent this through patient's mychart.   Daughter also reports that patient is likely going to receive blood transfusion next week. She does not have any details regarding this other than she believes that patient may have a bleeding ulcer.   She asked that I send message to Dr. Manson Passey regarding this concern.   Veronda Prude, RN

## 2023-03-31 NOTE — Telephone Encounter (Signed)
Attempted to call patient's daughter. If she calls back  Please recommend daughter Patient needs to be seen soon in our clinic if still our patient  Please ensure patient is under care of physician for transfusion and endoscopy--they will manage this.    Terisa Starr, MD  Family Medicine Teaching Service

## 2023-04-03 DIAGNOSIS — D649 Anemia, unspecified: Secondary | ICD-10-CM | POA: Diagnosis not present

## 2023-04-06 DIAGNOSIS — I10 Essential (primary) hypertension: Secondary | ICD-10-CM | POA: Diagnosis not present

## 2023-04-06 DIAGNOSIS — D649 Anemia, unspecified: Secondary | ICD-10-CM | POA: Diagnosis not present

## 2023-04-06 DIAGNOSIS — F330 Major depressive disorder, recurrent, mild: Secondary | ICD-10-CM | POA: Diagnosis not present

## 2023-04-12 DIAGNOSIS — I10 Essential (primary) hypertension: Secondary | ICD-10-CM | POA: Diagnosis not present

## 2023-04-12 DIAGNOSIS — F330 Major depressive disorder, recurrent, mild: Secondary | ICD-10-CM | POA: Diagnosis not present

## 2023-04-13 DIAGNOSIS — D649 Anemia, unspecified: Secondary | ICD-10-CM | POA: Diagnosis not present

## 2023-04-13 DIAGNOSIS — M25552 Pain in left hip: Secondary | ICD-10-CM | POA: Diagnosis not present

## 2023-04-13 DIAGNOSIS — I10 Essential (primary) hypertension: Secondary | ICD-10-CM | POA: Diagnosis not present

## 2023-04-13 DIAGNOSIS — M542 Cervicalgia: Secondary | ICD-10-CM | POA: Diagnosis not present

## 2023-04-13 DIAGNOSIS — R262 Difficulty in walking, not elsewhere classified: Secondary | ICD-10-CM | POA: Diagnosis not present

## 2023-04-14 NOTE — Progress Notes (Signed)
[facility] Loop Recorder  

## 2023-04-18 DIAGNOSIS — F339 Major depressive disorder, recurrent, unspecified: Secondary | ICD-10-CM | POA: Diagnosis not present

## 2023-04-20 DIAGNOSIS — M25552 Pain in left hip: Secondary | ICD-10-CM | POA: Diagnosis not present

## 2023-04-20 DIAGNOSIS — B372 Candidiasis of skin and nail: Secondary | ICD-10-CM | POA: Diagnosis not present

## 2023-04-20 DIAGNOSIS — M542 Cervicalgia: Secondary | ICD-10-CM | POA: Diagnosis not present

## 2023-04-20 DIAGNOSIS — M6281 Muscle weakness (generalized): Secondary | ICD-10-CM | POA: Diagnosis not present

## 2023-04-26 DIAGNOSIS — I10 Essential (primary) hypertension: Secondary | ICD-10-CM | POA: Diagnosis not present

## 2023-04-28 ENCOUNTER — Ambulatory Visit (INDEPENDENT_AMBULATORY_CARE_PROVIDER_SITE_OTHER): Payer: PPO

## 2023-04-28 DIAGNOSIS — I639 Cerebral infarction, unspecified: Secondary | ICD-10-CM | POA: Diagnosis not present

## 2023-04-28 LAB — CUP PACEART REMOTE DEVICE CHECK
Date Time Interrogation Session: 20241127211812
Implantable Pulse Generator Implant Date: 20240102
Pulse Gen Serial Number: 511019975

## 2023-05-03 DIAGNOSIS — F339 Major depressive disorder, recurrent, unspecified: Secondary | ICD-10-CM | POA: Diagnosis not present

## 2023-05-03 DIAGNOSIS — D500 Iron deficiency anemia secondary to blood loss (chronic): Secondary | ICD-10-CM | POA: Diagnosis not present

## 2023-05-05 NOTE — Progress Notes (Unsigned)
    SUBJECTIVE:   CHIEF COMPLAINT: medications HPI:   Lauren Craig is a 77 y.o.  with history notable for HTN, CVA, well controlled diabetes and memory issues presenting for follow up. She has recently moved to [place] and lives in an ALF. She is joined by her daughter today.   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  Today's weight:  Review of prior weights: There were no vitals filed for this visit.   Cardiac: Regular rate and rhythm. Normal S1/S2. No murmurs, rubs, or gallops appreciated. Lungs: Clear bilaterally to ascultation.  Abdomen: Normoactive bowel sounds. No tenderness to deep or light palpation. No rebound or guarding.  ***  Psych: Pleasant and appropriate    ASSESSMENT/PLAN:   Assessment & Plan Primary hypertension  Stage 3b chronic kidney disease (HCC)    Terisa Starr, MD  Family Medicine Teaching Service  [facility]

## 2023-05-08 ENCOUNTER — Ambulatory Visit: Payer: PPO | Admitting: Family Medicine

## 2023-05-08 NOTE — Progress Notes (Unsigned)
    SUBJECTIVE:   CHIEF COMPLAINT: edema  HPI:   Lauren Craig is a 77 y.o.  with history notable for CVA, vascular dementia, diabetes which is well controlled, and HTN presenting for follow up and check up. She is joined by her daughter. She now lives in [place] at [facility].  She reports she dislikes where she lives. Her daughter reports they have not paid the fees as her mom forgot to turn in the checks. Patient is NOT administering her own medications. She did NOT take her medications today.  She did have a fall 3 weeks ago. Was taken to ED. CT head negative per daughter. Was told her blood count was low. She is using a cane to walk--has a walker but dislikes this   The patient only has 1 concern today--her edema. Reports new over past few weeks. No other symptoms. No medication changes.  She is not sure of the medications she takes. No numbness, weakness, headaches, nausea, vomiting, melena, hematochezia, chest pain or dyspnea. Reports her bowel habits are better. Still desires to have event monitor removed.   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 Normal S1/S2. No murmurs, rubs, or gallops appreciated. Lungs: Clear bilaterally to ascultation.  Abdomen: Normoactive bowel sounds. No tenderness to deep or light palpation. No rebound or guarding.   LE  Bilateral symmetric 2+ edema to mid shin   Psych: Pleasant and appropriate    ASSESSMENT/PLAN:   Assessment & Plan Primary hypertension Not at goal as did not take  medications--limited by standing diastolic value in 50s no changes today   Vitamin B12 deficiency Repeat today  Controlled type 2 diabetes mellitus with complication, without long-term current use of insulin (HCC) A1C today UACR for this and CKD today Referral to Ophtho  Lower extremity edema DDX includes heart failure, worsening renal disease, medication complication, anemia, unlikely cirrhosis CBC, BNP, CMP today Ordered echocardiogram  Requested they call to schedule with cardiology  Nausea and vomiting, unspecified vomiting type Improved but given anemia still needs lower tract evaluation  Anemia requiring transfusions Per patient Hgb low enough to need blood? Requested medication list and records CBC today  Memory changes Voice is slower, lower today Having issues remember what task she is doing, recent events, upcoming holiday events  Suspect due to progression of vascular disease Requested medication list Referral back to Neurology  [place] Have asked daughter to request PT services Will request medication list--may need to scale back BP medications given diastolic values  Encouraged use of walker   HCM Flu and COVID were given at [facility]  Asked ALF to take for eye visit and DEXA  Terisa Starr, MD  Family Medicine Teaching Service  [facility]

## 2023-05-09 ENCOUNTER — Ambulatory Visit (INDEPENDENT_AMBULATORY_CARE_PROVIDER_SITE_OTHER): Payer: PPO | Admitting: Family Medicine

## 2023-05-09 ENCOUNTER — Encounter: Payer: Self-pay | Admitting: Family Medicine

## 2023-05-09 ENCOUNTER — Telehealth: Payer: Self-pay | Admitting: Family Medicine

## 2023-05-09 VITALS — BP 166/50 | HR 51 | Ht 64.0 in | Wt 153.8 lb

## 2023-05-09 DIAGNOSIS — E118 Type 2 diabetes mellitus with unspecified complications: Secondary | ICD-10-CM | POA: Diagnosis not present

## 2023-05-09 DIAGNOSIS — E538 Deficiency of other specified B group vitamins: Secondary | ICD-10-CM | POA: Diagnosis not present

## 2023-05-09 DIAGNOSIS — D649 Anemia, unspecified: Secondary | ICD-10-CM | POA: Diagnosis not present

## 2023-05-09 DIAGNOSIS — L209 Atopic dermatitis, unspecified: Secondary | ICD-10-CM | POA: Diagnosis not present

## 2023-05-09 DIAGNOSIS — I10 Essential (primary) hypertension: Secondary | ICD-10-CM

## 2023-05-09 DIAGNOSIS — R600 Localized edema: Secondary | ICD-10-CM

## 2023-05-09 DIAGNOSIS — R112 Nausea with vomiting, unspecified: Secondary | ICD-10-CM | POA: Diagnosis not present

## 2023-05-09 DIAGNOSIS — R413 Other amnesia: Secondary | ICD-10-CM

## 2023-05-09 NOTE — Assessment & Plan Note (Signed)
A1C today UACR for this and CKD today Referral to Ophtho

## 2023-05-09 NOTE — Patient Instructions (Addendum)
It was wonderful to see you today.  Please bring ALL of your medications with you to every visit.   Today we talked about:   Please call the following doctor  - Cardiology [phone].  - Gastroenterology - Neurology Phone: [phone]  -- I recommend using a walker  --I recommend going to an Eye doctor and bone test   Our lab at our clinic is closed today. I apologize for this issue.  You can  Go to [address] first floor lab corp today (across the street near Cardiology)  OR 2. Return at a later day for labs  If you choose to return for a later date, please schedule a visit  The orders are placed    Please follow up in 1 month   Thank you for choosing [facility].   Please call [phone] with any questions about today's appointment.  Please be sure to schedule follow up at the front  desk before you leave today.   Terisa Starr, MD  Family Medicine

## 2023-05-09 NOTE — Telephone Encounter (Signed)
Called [facility] Nursing Director April. Left generic voicemail if calls back please ask Request PT services for patient Request recent records from hospital stay Have them fax [place] updated medication list  Terisa Starr, MD  [facility] Medicine Teaching Service

## 2023-05-09 NOTE — Assessment & Plan Note (Signed)
Improved but given anemia still needs lower tract evaluation

## 2023-05-09 NOTE — Assessment & Plan Note (Signed)
Repeat today

## 2023-05-09 NOTE — Assessment & Plan Note (Signed)
Voice is slower, lower today Having issues remember what task she is doing, recent events, upcoming holiday events  Suspect due to progression of vascular disease Requested medication list Referral back to Neurology

## 2023-05-09 NOTE — Assessment & Plan Note (Signed)
Not at goal as did not take medications--limited by standing diastolic value in 50s no changes today

## 2023-05-10 ENCOUNTER — Telehealth: Payer: Self-pay | Admitting: Family Medicine

## 2023-05-10 DIAGNOSIS — N1832 Chronic kidney disease, stage 3b: Secondary | ICD-10-CM

## 2023-05-10 MED ORDER — TRIAMCINOLONE ACETONIDE 0.025 % EX OINT: 1.0000 | TOPICAL_OINTMENT | Freq: Two times a day (BID) | CUTANEOUS | 1 refills | Status: AC

## 2023-05-10 MED ORDER — FLUTICASONE PROPIONATE 50 MCG/ACT NA SUSP: 2.0000 | Freq: Every day | NASAL | 3 refills | Status: DC

## 2023-05-10 NOTE — Addendum Note (Signed)
Addended by: Manson Passey, Carnelius Hammitt on: 05/10/2023 05:19 AM   Modules accepted: Orders

## 2023-05-10 NOTE — Addendum Note (Signed)
Addended by: Manson Passey, Nancyann Cotterman on: 05/10/2023 06:47 AM   Modules accepted: Orders

## 2023-05-10 NOTE — Telephone Encounter (Signed)
Called ALF---asked for medication list and recent discharge.  They will fax. May need to pause some medications.   Called daughter to discuss results. Referral to [facility]. Also ordered echo, discussed, Nursing- please help facilitate scheduling of echo.  Terisa Starr, MD  Family Medicine Teaching Service

## 2023-05-11 ENCOUNTER — Telehealth: Payer: Self-pay | Admitting: *Deleted

## 2023-05-11 ENCOUNTER — Other Ambulatory Visit: Payer: Self-pay | Admitting: Family Medicine

## 2023-05-11 LAB — CBC
Hematocrit: 31.4 % — ABNORMAL LOW (ref 34.0–46.6)
Hemoglobin: 9.9 g/dL — ABNORMAL LOW (ref 11.1–15.9)
MCH: 26.5 pg — ABNORMAL LOW (ref 26.6–33.0)
MCHC: 31.5 g/dL (ref 31.5–35.7)
MCV: 84 fL (ref 79–97)
Platelets: 180 10*3/uL (ref 150–450)
RBC: 3.73 x10E6/uL — ABNORMAL LOW (ref 3.77–5.28)
RDW: 15.1 % (ref 11.7–15.4)
WBC: 7.1 10*3/uL (ref 3.4–10.8)

## 2023-05-11 LAB — MICROALBUMIN / CREATININE URINE RATIO
Creatinine, Urine: 55.6 mg/dL
Microalb/Creat Ratio: 1841 mg/g{creat} — ABNORMAL HIGH (ref 0–29)
Microalbumin, Urine: 1023.4 ug/mL

## 2023-05-11 LAB — COMPREHENSIVE METABOLIC PANEL
ALT: 11 [IU]/L (ref 0–32)
AST: 11 [IU]/L (ref 0–40)
Albumin: 4.1 g/dL (ref 3.8–4.8)
Alkaline Phosphatase: 87 [IU]/L (ref 44–121)
BUN/Creatinine Ratio: 14 (ref 12–28)
BUN: 38 mg/dL — ABNORMAL HIGH (ref 8–27)
Bilirubin Total: 0.3 mg/dL (ref 0.0–1.2)
CO2: 18 mmol/L — ABNORMAL LOW (ref 20–29)
Calcium: 9.1 mg/dL (ref 8.7–10.3)
Chloride: 109 mmol/L — ABNORMAL HIGH (ref 96–106)
Creatinine, Ser: 2.67 mg/dL — ABNORMAL HIGH (ref 0.57–1.00)
Globulin, Total: 2.5 g/dL (ref 1.5–4.5)
Glucose: 121 mg/dL — ABNORMAL HIGH (ref 70–99)
Potassium: 4.9 mmol/L (ref 3.5–5.2)
Sodium: 141 mmol/L (ref 134–144)
Total Protein: 6.6 g/dL (ref 6.0–8.5)
eGFR: 18 mL/min/{1.73_m2} — ABNORMAL LOW (ref >59)

## 2023-05-11 LAB — HEMOGLOBIN A1C
Est. average glucose Bld gHb Est-mCnc: 134 mg/dL
Hgb A1c MFr Bld: 6.3 % — ABNORMAL HIGH (ref 4.8–5.6)

## 2023-05-11 LAB — BRAIN NATRIURETIC PEPTIDE: BNP: 323.7 pg/mL — ABNORMAL HIGH (ref 0.0–100.0)

## 2023-05-11 LAB — VITAMIN B12: Vitamin B-12: 350 pg/mL (ref 232–1245)

## 2023-05-11 MED ORDER — MIRTAZAPINE 7.5 MG PO TABS: 7.5000 mg | ORAL_TABLET | Freq: Every day | ORAL | Status: DC

## 2023-05-11 MED ORDER — ESCITALOPRAM OXALATE 10 MG PO TABS: 10.0000 mg | ORAL_TABLET | Freq: Every day | ORAL | Status: DC

## 2023-05-11 MED ORDER — HYDRALAZINE HCL 50 MG PO TABS: 50.0000 mg | ORAL_TABLET | Freq: Four times a day (QID) | ORAL | Status: DC

## 2023-05-11 MED ORDER — EZETIMIBE 10 MG PO TABS: 10.0000 mg | ORAL_TABLET | Freq: Every day | ORAL | Status: DC

## 2023-05-11 MED ORDER — PANTOPRAZOLE SODIUM 40 MG PO TBEC: 40.0000 mg | DELAYED_RELEASE_TABLET | Freq: Every day | ORAL | Status: DC

## 2023-05-11 NOTE — Progress Notes (Signed)
  Care Coordination  Outreach Note  05/11/2023 Name: Lauren Craig MRN: [id number] DOB: [birth year]   Care Coordination Outreach Attempts: An unsuccessful telephone outreach was attempted today to offer the patient information about available care coordination services.  Follow Up Plan:  Additional outreach attempts will be made to offer the patient care coordination information and services.   Encounter Outcome:  No Answer  Gwenevere Ghazi  Care Coordination Care Guide  Direct Dial: [phone]

## 2023-05-11 NOTE — Progress Notes (Signed)
Medication list updated from ALF. Called and spoke with RN Repeat labs and dose reduce crestor. Discussed need to see nephrology  Terisa Starr, MD  [facility] Medicine Teaching Service

## 2023-05-16 NOTE — Telephone Encounter (Signed)
Complex Care Management Note   05/16/2023 Name: Lauren Craig MRN: [id number] DOB: 23-Nov-1945  Lauren Craig is a 77 y.o. year old female who sees Westley Chandler, MD for primary care. I reached out to Lauren Craig by phone today to offer complex care management services.  Lauren Craig was given information about Complex Care Management services today including:   The Complex Care Management services include support from the care team which includes your Nurse Coordinator, Clinical Social Worker, or Pharmacist.  The Complex Care Management team is here to help remove barriers to the health concerns and goals most important to you. Complex Care Management services are voluntary, and the patient may decline or stop services at any time by request to their care team member.   Complex Care Management Consent Status: Patient agreed to services and verbal consent obtained.   Follow up plan:  Telephone appointment with complex care management team member scheduled for:  12/24  Encounter Outcome:  Patient Scheduled  [facility] Coordination Care Guide  Direct Dial: [phone]

## 2023-05-19 NOTE — Progress Notes (Signed)
[facility] Summary Report / Loop Recorder 

## 2023-05-23 ENCOUNTER — Other Ambulatory Visit: Payer: PPO | Admitting: *Deleted

## 2023-05-29 ENCOUNTER — Ambulatory Visit: Payer: PPO

## 2023-05-29 ENCOUNTER — Other Ambulatory Visit: Payer: Self-pay | Admitting: *Deleted

## 2023-05-29 ENCOUNTER — Encounter: Payer: Self-pay | Admitting: *Deleted

## 2023-05-29 DIAGNOSIS — I639 Cerebral infarction, unspecified: Secondary | ICD-10-CM | POA: Diagnosis not present

## 2023-05-29 NOTE — Patient Outreach (Signed)
Care Management   Visit Note  05/29/2023 Name: Arion Mattox MRN: [id number] DOB: [birth year]  Subjective: Lauren Craig is a 77 y.o. year old female who is a primary care patient of Westley Chandler, MD. The Care Management team was consulted for assistance.      Engaged with patient spoke with the family member (POA, [place], [place]).    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  Non-adherence to prescribed medication regimen Cognitive Deficits  RNCM Clinical Goal(s):  Patient will verbalize basic understanding of  DMII and Dementia disease process and self health management plan as evidenced by verbal explanation, recognizing symptoms, lifestyle modifications take all medications exactly as prescribed and will call provider for medication related questions as evidenced by compliance with all medications attend all scheduled medical appointments: with primary care provider and specialist as evidenced by keeping all scheduled appointments demonstrate Improved and Ongoing adherence to prescribed treatment plan for DMII and Dementia as evidenced by consistent medication compliance, symptom monitoring, continued lifestyle modifications continue to work with RN Care Manager to address care management and care coordination needs related to  DMII and Dementia as evidenced by adherence to CM Team Scheduled appointments work with Child psychotherapist to address  related to the management of Level of care concerns and Cognitive Deficits related to the management of DMII and Dementia as evidenced by review of EMR and patient or social worker report through collaboration with Medical illustrator, provider, and  care team.   Interventions: Evaluation of current treatment plan related to  self management and patient's adherence to plan as established by provider   Diabetes Interventions:  (Status:  Condition stable.  Not addressed this visit.) Long Term Goal Assessed patient's understanding of A1c goal: <6.5% Provided education to patient about basic DM disease process Reviewed medications with patient and discussed importance of medication adherence Counseled on importance of regular laboratory monitoring as prescribed Discussed plans with patient for ongoing care management follow up and provided patient with direct contact information for care management team Provided patient with written educational materials related to hypo and hyperglycemia and importance of correct treatment Reviewed scheduled/upcoming provider appointments including: No PCP follow ups scheduled at this time.  Advised patient, providing education and rationale, to check cbg as recommended and record, calling provider for findings outside established parameters Referral made to social work team for assistance with level of care concerns: [place] LTC Medicaid, SNF placement- Skilled need? Review of patient status, including review of consultants reports, relevant laboratory and other test results, and medications completed Screening for signs and symptoms of depression related to chronic disease state  Assessed social determinant of health barriers Lab Results  Component Value Date   HGBA1C 6.3 (H) 05/09/2023    Falls Interventions:  (Status:  New goal. and Goal on track:  Yes.) Long Term Goal Provided written and verbal education re: potential causes of falls and Fall prevention strategies Reviewed medications and discussed potential side effects of medications such as dizziness and frequent urination Advised patient of importance of notifying provider of falls. Per daughter, patient has had several falls in 2024. Assessed for  falls since last encounter Assessed  patients knowledge of fall risk prevention secondary to previously provided education Advised patient to discuss new changes or frequent falls with provider Screening for signs and symptoms of depression related to chronic disease state  Assessed social determinant of health barriers   Dementia:  (Status:  New goal. and Goal on track:  Yes.)  Long Term Goal Evaluation of current treatment plan related Dementia Collaborated with PCP & SW re: skilled need for SNF placement in [place]  Made referral to social worker for placement in [place], LTC Medicaid,  Emotional Support Provided to patient/caregiver Discussed importance of discussing diagnosis with provider Discussed importance of attendance to all provider appointments. RNCM spoke with [place], daughter and she states she is able to get patient to all appointments, she just needs to be notified. She expressed how she would prefer if patient could be placed in [place] due to all of her providers being in the [place] area. RNCM spoke with SW regarding this and patient will need a skilled need to transition over to SNF in [place].  Advised to contact provider for new or worsening symptoms  Patient Goals/Self-Care Activities: Take all medications as prescribed Attend all scheduled provider appointments Attend church or other social activities Perform all self care activities independently  Perform IADL's (shopping, preparing meals, housekeeping, managing finances) independently Call provider office for new concerns or questions  Work with the social worker to address care coordination needs and will continue to work with the clinical team to address health care and disease management related needs  Follow Up Plan:  Telephone follow up appointment with care management team member scheduled for:  07-03-2023 at 1:45 pm           Consent to Services:  Patient was given information about care management services,  agreed to services, and gave verbal consent to participate.   Plan: Telephone follow up appointment with care management team member scheduled for:07-03-2023 at 1:45 pm  Danise Edge, BSN RN RN Care Manager  [facility]  Ambulatory Care Management  Direct Number: [phone]

## 2023-05-29 NOTE — Patient Instructions (Signed)
Care Management   Initial Visit Note  05/29/2023 Name: Lauren Craig MRN: [id number] DOB: 1945/07/29  Lauren Craig is enrolled in a Managed Medicaid plan: No. Outreach attempt today was successful.   Subjective:   Objective:  Assessment: Lauren Craig is a 77 y.o. year old female who sees Lauren Chandler, MD for primary care. The care management team was consulted for assistance with care management and care coordination needs related to Disease Management, Care Coordination, Dementia and Caregiver Support, and Level of Care Concerns.   Review of patient status, including review of consultants reports, relevant laboratory and other test results, and collaboration with appropriate care team members and the patient's provider was performed as part of comprehensive patient evaluation and provision of care management services.    SDOH (Social Determinants of Health) screening performed today. 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  Non-adherence to prescribed medication regimen Cognitive Deficits  RNCM Clinical Goal(s):  Patient will verbalize basic understanding of  DMII and Dementia disease process and self health management plan as evidenced by verbal explanation, recognizing symptoms, lifestyle modifications take all medications exactly as prescribed and will call provider for medication related questions as evidenced by compliance with all medications attend all scheduled medical appointments: with primary care provider and specialist as evidenced by keeping all scheduled appointments demonstrate Improved and Ongoing  adherence to prescribed treatment plan for DMII and Dementia as evidenced by consistent medication compliance, symptom monitoring, continued lifestyle modifications continue to work with RN Care Manager to address care management and care coordination needs related to  DMII and Dementia as evidenced by adherence to CM Team Scheduled appointments work with Child psychotherapist to address  related to the management of Level of care concerns and Cognitive Deficits related to the management of DMII and Dementia as evidenced by review of EMR and patient or social worker report through collaboration with Medical illustrator, provider, and care team.   Interventions: Evaluation of current treatment plan related to  self management and patient's adherence to plan as established by provider   Diabetes Interventions:  (Status:  Condition stable.  Not addressed this visit.) Long Term Goal Assessed patient's understanding of A1c goal: <6.5% Provided education to patient about basic DM disease process Reviewed medications with patient and discussed importance of medication adherence Counseled on importance of regular laboratory monitoring as prescribed Discussed plans with patient for ongoing care management follow up and provided patient with direct contact information for care management team Provided patient with written educational materials related to hypo and hyperglycemia and importance of correct treatment Reviewed scheduled/upcoming provider appointments including: No PCP follow ups scheduled at this time.  Advised patient, providing education and rationale, to check cbg as recommended and record, calling provider for findings outside established parameters Referral made to social work team for assistance with level of care concerns:  LTC Medicaid, SNF placement- Skilled need? Review of patient status, including review of consultants reports, relevant laboratory and other test results, and medications  completed Screening for signs and symptoms of depression related to chronic disease state  Assessed social determinant  of health barriers Lab Results  Component Value Date   HGBA1C 6.3 (H) 05/09/2023    Falls Interventions:  (Status:  New goal. and Goal on track:  Yes.) Long Term Goal Provided written and verbal education re: potential causes of falls and Fall prevention strategies Reviewed medications and discussed potential side effects of medications such as dizziness and frequent urination Advised patient of importance of notifying provider of falls. Per daughter, patient has had several falls in 2024. Assessed for falls since last encounter Assessed patients knowledge of fall risk prevention secondary to previously provided education Advised patient to discuss new changes or frequent falls with provider Screening for signs and symptoms of depression related to chronic disease state  Assessed social determinant of health barriers   Dementia:  (Status:  New goal. and Goal on track:  Yes.)  Long Term Goal Evaluation of current treatment plan related Dementia Collaborated with PCP & SW re: skilled need for SNF placement in [place]  Made referral to social worker for placement in [place], LTC Medicaid,  Emotional Support Provided to patient/caregiver Discussed importance of discussing diagnosis with provider Discussed importance of attendance to all provider appointments. RNCM spoke with Lauren Craig, daughter and she states she is able to get patient to all appointments, she just needs to be notified. She expressed how she would prefer if patient could be placed in [place] due to all of her providers being in the [place] area. RNCM spoke with SW regarding this and patient will need a skilled need to transition over to SNF in [place].  Advised to contact provider for new or worsening symptoms  Patient Goals/Self-Care Activities: Take all medications as prescribed Attend all scheduled provider  appointments Attend church or other social activities Perform all self care activities independently  Perform IADL's (shopping, preparing meals, housekeeping, managing finances) independently Call provider office for new concerns or questions  Work with the social worker to address care coordination needs and will continue to work with the clinical team to address health care and disease management related needs  Follow Up Plan:  Telephone follow up appointment with care management team member scheduled for:  07-03-2023 at 1:45 pm           Follow up plan:  Telephone follow up appointment with care management team member scheduled for:07-03-2023 at 1:45 pm  Ms. Knoll was given information about Care Management services today including:  Care Management services include personalized support from designated clinical staff supervised by a physician, including individualized plan of care and coordination with other care providers 24/7 contact phone numbers for assistance for urgent and routine care needs. The patient may stop CCM services at any time (effective at the end of the month) by phone call to the office staff.  Patient agreed to services and verbal consent obtained. Danise Edge, BSN RN RN Care Manager  [place]  Ambulatory Care Management  Direct Number: [phone]

## 2023-05-30 LAB — CUP PACEART REMOTE DEVICE CHECK
Date Time Interrogation Session: 20241230030132
Implantable Pulse Generator Implant Date: 20240102
Pulse Gen Serial Number: 511019975

## 2023-06-02 DIAGNOSIS — I10 Essential (primary) hypertension: Secondary | ICD-10-CM | POA: Diagnosis not present

## 2023-06-02 LAB — LAB REPORT - SCANNED: EGFR: 28

## 2023-06-06 ENCOUNTER — Ambulatory Visit: Payer: Self-pay | Admitting: Licensed Clinical Social Worker

## 2023-06-06 NOTE — Patient Instructions (Signed)
 Visit Information  Thank you for taking time to visit with me today. Please don't hesitate to contact me if I can be of assistance to you.   Following are the goals we discussed today:   Goals Addressed             This Visit's Progress    Care Coodination       Collaborate with facility social worker at current assisted living to transfer care to another facility in [place], [place].  Contact [place], [place] to apply for long term care medicaid.  Engage in self care techniques to prevent caregiver burnout.         Our next appointment is by telephone on 06/27/2023 at 10am  Please call the care guide team at [phone] if you need to cancel or reschedule your appointment.   If you are experiencing a Mental Health or Behavioral Health Crisis or need someone to talk to, please call 911   Patient verbalizes understanding of instructions and care plan provided today and agrees to view in MyChart. Active MyChart status and patient understanding of how to access instructions and care plan via MyChart confirmed with patient.     Telephone follow up appointment with care management team member scheduled for:  Alfonso Rummer MSW, LCSW Licensed Clinical Social Worker  [facility], Population Health Direct Dial: [phone]  Fax: [phone]

## 2023-06-06 NOTE — Patient Outreach (Signed)
  Care Coordination   Initial Visit Note   06/06/2023 Name: Lauren Craig MRN: [id number] DOB: 21-Nov-1945  Lauren Craig is a 78 y.o. year old female who sees Delores Suzann HERO, MD for primary care. I spoke with  Lauren Craig daughter Veretta Sabourin by phone today.  What matters to the patients health and wellness today?  LCSW A Craig and [name] daughter Lauren Craig completed initial phone visit. Pt S Koebel reports pt currently resides in an assisted living facility in [place]. Pt wants to transfer to another facility in [facility]. LCSW A. Luara Faye encouraged pt and daughter to collaborate with assisted living social worker regarding transfer. Additionally, encourage pt daughter to apply for long term care medicaid with DSS.     Goals Addressed             This Visit's Progress    Care Coodination       Collaborate with facility social worker at current assisted living to transfer care to another facility in [place], [place].  Contact [place], [place] to apply for long term care medicaid.  Engage in self care techniques to prevent caregiver burnout.         SDOH assessments and interventions completed:  Yes  SDOH Interventions Today    Flowsheet Row Most Recent Value  SDOH Interventions   Food Insecurity Interventions Intervention Not Indicated  Housing Interventions Intervention Not Indicated  Utilities Interventions Intervention Not Indicated        Care Coordination Interventions:  Yes, provided  Interventions Today    Flowsheet Row Most Recent Value  General Interventions   General Interventions Discussed/Reviewed Community Resources, Level of Care  Level of Care --  [Lauren Craig is currently residing in an assisted living [place] want [place] transfer to a facility in [place], [place].]  Education Interventions   Education Provided --  Lauren Craig pt daughter to collaborate with [facility] DSS to apply for long term care medicaid. [name]  daughter resides in [place] and wants to transfer care to [place].]  Mental Health Interventions   Mental Health Discussed/Reviewed --  [Provide caregiver support and encouraged self care to prevent burnout]       Follow up plan: Follow up call scheduled for 06/27/2023    Encounter Outcome:  Patient Visit Completed   Lauren Craig MSW, LCSW Licensed Clinical Social Worker  [facility], Population Health Direct Dial: [phone]  Fax: [phone]

## 2023-06-09 ENCOUNTER — Telehealth: Payer: Self-pay

## 2023-06-09 ENCOUNTER — Ambulatory Visit (HOSPITAL_COMMUNITY): Payer: PPO

## 2023-06-09 ENCOUNTER — Telehealth: Payer: Self-pay | Admitting: Family Medicine

## 2023-06-09 NOTE — Telephone Encounter (Signed)
 Patient had repeat labs at ALF--yet to receive.   CMA team please call ALF ([facility] in [place]) and ask them to fax updated labs ASAP  Please also see staff message about echo  Terisa Starr, MD  [facility] Medicine Teaching Service

## 2023-06-09 NOTE — Telephone Encounter (Signed)
-----   Message from Lauren Craig sent at 06/08/2023  5:24 AM EST ----- Regarding: Echo Hi team  Can you please schedule this patients echo and call daughter?  Thanks! CB

## 2023-06-09 NOTE — Telephone Encounter (Signed)
 Contacted [facility] they will fax over requested documents. Tried calling patient's daughter Ilayda Toda [phone] but she did not answer. LVM informing her of patient's echo appointment on 06/21/23 at 11:30 am at [facility]. Left the office number in case of questions or concerns. Cassell Mary CMA

## 2023-06-09 NOTE — Telephone Encounter (Signed)
 See previous message. Penni Bombard CMA

## 2023-06-09 NOTE — Telephone Encounter (Signed)
 Repeat BMP shows creatinine of 1.81 and NP at ALF has medications. Has follow up with Nephrology.  Terisa Starr, MD  Family Medicine Teaching Service

## 2023-06-15 DIAGNOSIS — B372 Candidiasis of skin and nail: Secondary | ICD-10-CM | POA: Diagnosis not present

## 2023-06-21 ENCOUNTER — Ambulatory Visit (HOSPITAL_COMMUNITY)
Admission: RE | Admit: 2023-06-21 | Discharge: 2023-06-21 | Disposition: A | Discharge status: Home / Self Care | Payer: PPO | Source: Ambulatory Visit | Attending: Family Medicine | Admitting: Family Medicine

## 2023-06-21 DIAGNOSIS — R600 Localized edema: Secondary | ICD-10-CM | POA: Diagnosis not present

## 2023-06-21 LAB — ECHOCARDIOGRAM COMPLETE
AR max vel: 1.4 cm2
AV Area VTI: 1.53 cm2
AV Area mean vel: 1.34 cm2
AV Mean grad: 8 mm[Hg]
AV Peak grad: 13.7 mm[Hg]
Ao pk vel: 1.85 m/s
Area-P 1/2: 3.56 cm2
Calc EF: 53.7 %
MV VTI: 1.95 cm2
S' Lateral: 3.6 cm
Single Plane A2C EF: 61.7 %
Single Plane A4C EF: 52.5 %

## 2023-06-21 NOTE — Progress Notes (Signed)
  Echocardiogram 2D Echocardiogram has been performed.  Ocie Doyne RDCS 06/21/2023, 1:01 PM

## 2023-06-21 NOTE — Progress Notes (Deleted)
  SUBJECTIVE:   CHIEF COMPLAINT / HPI:   Leg Swelling and Previous Labs  PERTINENT  PMH / PSH: ***  Past Medical History:  Diagnosis Date   Anxiety disorder    Arthritis    hands, hips   C. 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 Physical Exam   ASSESSMENT/PLAN:   Assessment & Plan  No follow-ups on file. Bess Kinds, MD 06/21/2023, 7:15 PM PGY-***, [facility] Family Medicine {    This will disappear when note is signed, click to select method of visit    :1}

## 2023-06-21 NOTE — Patient Instructions (Incomplete)
It was great to see you! Thank you for allowing me to participate in your care!  I recommend that you always bring your medications to each appointment as this makes it easy to ensure we are on the correct medications and helps us not miss when refills are needed.  Our plans for today:  - *** -   We are checking some labs today, I will call you if they are abnormal will send you a MyChart message or a letter if they are normal.  If you do not hear about your labs in the next 2 weeks please let us know.***  Take care and seek immediate care sooner if you develop any concerns.   Dr. Akeiba Axelson, MD [facility]  

## 2023-06-22 ENCOUNTER — Ambulatory Visit: Payer: PPO

## 2023-06-22 ENCOUNTER — Encounter: Payer: Self-pay | Admitting: Student

## 2023-06-22 ENCOUNTER — Ambulatory Visit (INDEPENDENT_AMBULATORY_CARE_PROVIDER_SITE_OTHER): Payer: PPO | Admitting: Student

## 2023-06-22 VITALS — BP 180/66 | HR 68 | Ht 64.0 in | Wt 159.4 lb

## 2023-06-22 DIAGNOSIS — R600 Localized edema: Secondary | ICD-10-CM

## 2023-06-22 DIAGNOSIS — I10 Essential (primary) hypertension: Secondary | ICD-10-CM

## 2023-06-22 DIAGNOSIS — E875 Hyperkalemia: Secondary | ICD-10-CM | POA: Diagnosis not present

## 2023-06-22 MED ORDER — FUROSEMIDE 20 MG PO TABS: 20.0000 mg | ORAL_TABLET | Freq: Every day | ORAL | 0 refills | Status: DC | PRN

## 2023-06-22 MED ORDER — CARVEDILOL 12.5 MG PO TABS: 12.5000 mg | ORAL_TABLET | Freq: Two times a day (BID) | ORAL | 1 refills | Status: DC

## 2023-06-22 MED ORDER — CLONIDINE HCL 0.1 MG PO TABS: 0.1000 mg | ORAL_TABLET | Freq: Two times a day (BID) | ORAL | 0 refills | Status: DC

## 2023-06-22 NOTE — Patient Instructions (Signed)
We are rechecking your labs today.  I would like to know when you see Dr. Sherrilee Gilles on Monday, when your kidney appt is.  Take the furosemide. Start with 20mg  daily, you *may* need 40mg  daily. Try 20 for 2 days and if no response, go up to 40mg .  We need you to restart your BP meds. I am sending a short supply to the pharmacy in [place] and then hopefully Drs. Sherrilee Gilles and Manson Passey can help get things sorted once you move.  Of course, if you develop shortness of breath, chest pain, or this gets rapidly worse, go to the ER.   Eliezer Mccoy, MD

## 2023-06-22 NOTE — Progress Notes (Signed)
    CHIEF COMPLAINT / HPI:   Here with her daughter, Martie Lee.   BLE Edema Has been an issue on-and-off for a while now.  She was last seen in our office in December 2024 for the same.  Dr. Theora Gianotti work up at that time demonstrated fairly significant albuminuria with a microalbumin to creatinine ratio of 1841.  She was referred to nephrology but has yet to actually see them. NP at her facility got labs which showed Cr of 1.81 on 06/09/23, down from 2.47 previously. Unfortunately she is back today due to worsening in her ankle edema. She's noticed this over the past two days or so. Is not as bad as it has been in the past.  She also expresses frustration that her facility held her BP meds this morning as she feels this may be contributing to both her edema and her markedly elevated BP in clinic today.  She actually just had an echo yesterday which showed intact EF (60-65%), grade II diastolic dysfunction, and a degenerative mitral valve. She denies any chest pain or shortness of breath with the present episode of swelling.   Social She is currently living at [facility] in [place] and does not manage her own medications. She is moving on Saturday 1/25 to a different facility where she will be able to manage her own meds. She and her daughter are hopeful this may help in streamlining her care.    PERTINENT  PMH / PSH: CVA, Vascular Dementia, DM2, HTN  OBJECTIVE:   BP (!) 186/137   Pulse 68   Ht 5\' 4"  (1.626 m)   Wt 159 lb 6.4 oz (72.3 kg)   SpO2 95%   BMI 27.36 kg/m   Gen: Chronically ill appearing but in good spirits, joking in the exam room Neck: Without JVD Cardio: RRR without murmur Pulm: Normal WOB on RA, lungs without rales, speaking in full sentences  Ext: 1+ pitting edema to the mid-shin bilaterally     ASSESSMENT/PLAN:   Assessment & Plan Bilateral leg edema Recurring issue. Dr. 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  Echo without an obvious cardiac etiology.  - Will repeat BMP and UACR today - CBC, iron studies given history of anemia - Will add Lasix 20mg  daily, advised to uptitrate to 40mg  daily if no response in 48 hours - Return early next week for close follow-up with Dr. Sherrilee Gilles on 1/27 - Needs followup with nephrology, anticipate this may be easier to schedule/arrange once she has moved out of her current living arrangement Primary hypertension Markedly elevated BP today. Likely due to her not having her medications today, possibly complicated by some element of rebound HTN from her being off of her Clonidine.  - Daughter requests that refills of Coreg and Clonidine [facility] [place]  - Close follow-up next week, presumably she will have taken her meds at that time and we can get a better idea of what her BP is really doing    J Dorothyann Gibbs, MD [facility]

## 2023-06-23 LAB — BASIC METABOLIC PANEL
BUN/Creatinine Ratio: 16 (ref 12–28)
BUN: 36 mg/dL — ABNORMAL HIGH (ref 8–27)
CO2: 16 mmol/L — ABNORMAL LOW (ref 20–29)
Calcium: 8.7 mg/dL (ref 8.7–10.3)
Chloride: 110 mmol/L — ABNORMAL HIGH (ref 96–106)
Creatinine, Ser: 2.24 mg/dL — ABNORMAL HIGH (ref 0.57–1.00)
Glucose: 159 mg/dL — ABNORMAL HIGH (ref 70–99)
Potassium: 5.9 mmol/L — ABNORMAL HIGH (ref 3.5–5.2)
Sodium: 143 mmol/L (ref 134–144)
eGFR: 22 mL/min/{1.73_m2} — ABNORMAL LOW (ref >59)

## 2023-06-23 LAB — CBC
Hematocrit: 32.2 % — ABNORMAL LOW (ref 34.0–46.6)
Hemoglobin: 10.3 g/dL — ABNORMAL LOW (ref 11.1–15.9)
MCH: 26.5 pg — ABNORMAL LOW (ref 26.6–33.0)
MCHC: 32 g/dL (ref 31.5–35.7)
MCV: 83 fL (ref 79–97)
Platelets: 209 10*3/uL (ref 150–450)
RBC: 3.89 x10E6/uL (ref 3.77–5.28)
RDW: 14.5 % (ref 11.7–15.4)
WBC: 5.4 10*3/uL (ref 3.4–10.8)

## 2023-06-23 LAB — MICROALBUMIN / CREATININE URINE RATIO
Creatinine, Urine: 48 mg/dL
Microalb/Creat Ratio: 2960 mg/g{creat} — ABNORMAL HIGH (ref 0–29)
Microalbumin, Urine: 1420.8 ug/mL

## 2023-06-23 LAB — IRON,TIBC AND FERRITIN PANEL
Ferritin: 131 ng/mL (ref 15–150)
Iron Saturation: 20 % (ref 15–55)
Iron: 52 ug/dL (ref 27–139)
Total Iron Binding Capacity: 256 ug/dL (ref 250–450)
UIBC: 204 ug/dL (ref 118–369)

## 2023-06-23 MED ORDER — LOKELMA 10 G PO PACK: 10.0000 g | PACK | Freq: Two times a day (BID) | ORAL | 0 refills | Status: DC

## 2023-06-23 MED ORDER — FUROSEMIDE 20 MG PO TABS: 20.0000 mg | ORAL_TABLET | Freq: Every day | ORAL | 0 refills | Status: DC | PRN

## 2023-06-24 NOTE — Assessment & Plan Note (Signed)
Markedly elevated BP today. Likely due to her not having her medications today, possibly complicated by some element of rebound HTN from her being off of her Clonidine.  - Daughter requests that [facility] of Coreg [facility] [place]  - Close follow-up next week, presumably she will have taken her meds at that time and we can get a better idea of what her BP is really doing

## 2023-06-26 ENCOUNTER — Ambulatory Visit: Payer: Self-pay | Admitting: Family Medicine

## 2023-06-26 ENCOUNTER — Ambulatory Visit (INDEPENDENT_AMBULATORY_CARE_PROVIDER_SITE_OTHER): Payer: PPO | Admitting: Family Medicine

## 2023-06-26 VITALS — BP 165/67 | HR 67 | Ht 64.0 in | Wt 150.5 lb

## 2023-06-26 DIAGNOSIS — I10 Essential (primary) hypertension: Secondary | ICD-10-CM

## 2023-06-26 DIAGNOSIS — R600 Localized edema: Secondary | ICD-10-CM

## 2023-06-26 DIAGNOSIS — J310 Chronic rhinitis: Secondary | ICD-10-CM | POA: Diagnosis not present

## 2023-06-26 DIAGNOSIS — N1832 Chronic kidney disease, stage 3b: Secondary | ICD-10-CM

## 2023-06-26 DIAGNOSIS — F39 Unspecified mood [affective] disorder: Secondary | ICD-10-CM

## 2023-06-26 DIAGNOSIS — I739 Peripheral vascular disease, unspecified: Secondary | ICD-10-CM | POA: Diagnosis not present

## 2023-06-26 DIAGNOSIS — E875 Hyperkalemia: Secondary | ICD-10-CM | POA: Diagnosis not present

## 2023-06-26 DIAGNOSIS — R112 Nausea with vomiting, unspecified: Secondary | ICD-10-CM | POA: Diagnosis not present

## 2023-06-26 DIAGNOSIS — I63512 Cerebral infarction due to unspecified occlusion or stenosis of left middle cerebral artery: Secondary | ICD-10-CM

## 2023-06-26 MED ORDER — ROSUVASTATIN CALCIUM 40 MG PO TABS: 40.0000 mg | ORAL_TABLET | Freq: Every day | ORAL | 3 refills | Status: DC

## 2023-06-26 MED ORDER — FLUTICASONE PROPIONATE 50 MCG/ACT NA SUSP: 2.0000 | Freq: Every day | NASAL | 3 refills | Status: DC

## 2023-06-26 MED ORDER — AMLODIPINE BESYLATE 5 MG PO TABS: 5.0000 mg | ORAL_TABLET | Freq: Every day | ORAL | 3 refills | Status: DC

## 2023-06-26 MED ORDER — CLONIDINE HCL 0.1 MG PO TABS: 0.1000 mg | ORAL_TABLET | Freq: Two times a day (BID) | ORAL | 0 refills | Status: DC

## 2023-06-26 MED ORDER — CARVEDILOL 12.5 MG PO TABS: 12.5000 mg | ORAL_TABLET | Freq: Two times a day (BID) | ORAL | 1 refills | Status: DC

## 2023-06-26 MED ORDER — ASPIRIN 81 MG PO TBEC: 81.0000 mg | DELAYED_RELEASE_TABLET | Freq: Every day | ORAL | 12 refills | Status: DC

## 2023-06-26 MED ORDER — SUCRALFATE 1 GM/10ML PO SUSP: 1.0000 g | Freq: Three times a day (TID) | ORAL | 0 refills | Status: DC

## 2023-06-26 MED ORDER — MIRTAZAPINE 7.5 MG PO TABS: 7.5000 mg | ORAL_TABLET | Freq: Every day | ORAL | Status: DC

## 2023-06-26 MED ORDER — EZETIMIBE 10 MG PO TABS: 10.0000 mg | ORAL_TABLET | Freq: Every day | ORAL | Status: DC

## 2023-06-26 NOTE — Patient Instructions (Addendum)
Please call: [facility] [address] [place], [place] [phone]  I have sent in refills of medications  Amlodipine dose switched to 5 mg due to leg swelling  Hold hydralazine for now until we follow up

## 2023-06-26 NOTE — Assessment & Plan Note (Signed)
Has been out of medications for almost a week due to unfortunate mishap after moving away from her old nursing home.Marland Kitchen  Restart amlodipine at 5 mg (due to the leg swelling), restart clonidine and Coreg.  Hold hydralazine for now given that she has been out of it for about a week.  Follow-up in 1 week after restarting medications, may consider restarting hydralazine if still hypertensive at that time

## 2023-06-26 NOTE — Progress Notes (Signed)
    SUBJECTIVE:   CHIEF COMPLAINT / HPI:   Seen by Dr. Marisue Humble on 1/23 for bilateral lower extremity edema Previous workup was concerning for possible renal etiology -was referred to nephrology but has not been able to follow up yet Patient was advised to take Lasix 20 mg daily on 1/23 Echo in 1/22 showed EF 60-65% with grade 2 diastolic dysfunction  Also had elevated BP at that visit However she had not taken her blood pressure medication that day Current regimen includes amlodipine 10 mg daily, Coreg 12.5 mg twice daily, clonidine 0.1 mg twice daily, hydralazine 50 mg 4 times daily Has not taken these due to being out of meds  Blood work done after her visit on 1/23 showed significantly elevated UACR of nearly 3000, creatinine 2.24, potassium 5.9.  Patient was started on Lokelma in addition to the Lasix. However, patient was unable to get this due to her move    Today 1/27, pt's daughter reports:  Pt moved from [facility] in [place] on Saturday 06/17/2023 to [facility] in [place] However, apparently all of her medications were "discontinued" by [facility] Quill and so she has been out of all medications since at least Wednesday 1/22 Was able to take Lasix but not Lokelma States leg swelling has slightly improved with lasix Denies SOB, CP, headache, vision changes, n/v, abd pain    PERTINENT  PMH / PSH: Hypertension, CKD, peripheral vascular disease, history of CVA  OBJECTIVE:   BP (!) 165/67   Pulse 67   Ht 5\' 4"  (1.626 m)   Wt 150 lb 8 oz (68.3 kg)   SpO2 95%   BMI 25.83 kg/m   General: NAD, pleasant, able to participate in exam Cardiac: RRR, no murmurs auscultated Respiratory: CTAB, normal WOB Abdomen: soft, non-tender, non-distended, normoactive bowel sounds Extremities: 1+ bilateral lower extremity edema to ankles   ASSESSMENT/PLAN:   Assessment & Plan Bilateral leg edema Has been improving with Lasix. Concern for nephrotic syndrome based on past workup.     Provided number for them to contact [facility] to set up an appointment.  Additionally, as we are restarting multiple medications today will decrease amlodipine from 10 mg to 5 mg in case this was contributing to her edema Hyperkalemia K of 5.9 noted on BMP 06/22/2023.  Was meant to be treated with [facility] although patient was never able to pick this up.  Will recheck BMP today.  Given recent Lasix use we will hold that okay has gone down.  If still elevated, Rx Lokelma and recheck BMP at follow-up. Primary hypertension Has been out of medications for almost a week due to unfortunate mishap after moving away from her old nursing home.Marland Kitchen  Restart amlodipine at 5 mg (due to the leg swelling), restart clonidine and Coreg.  Hold hydralazine for now given that she has been out of it for about a week.  Follow-up in 1 week after restarting medications, may consider restarting hydralazine if still hypertensive at that time   Refilled multiple medications that patient has been unable to obtain after moving away from her ALF in [place].  Vonna Drafts, MD [facility]

## 2023-06-27 ENCOUNTER — Ambulatory Visit: Payer: Self-pay | Admitting: Licensed Clinical Social Worker

## 2023-06-27 LAB — BASIC METABOLIC PANEL
BUN/Creatinine Ratio: 16 (ref 12–28)
BUN: 35 mg/dL — ABNORMAL HIGH (ref 8–27)
CO2: 17 mmol/L — ABNORMAL LOW (ref 20–29)
Calcium: 8.9 mg/dL (ref 8.7–10.3)
Chloride: 109 mmol/L — ABNORMAL HIGH (ref 96–106)
Creatinine, Ser: 2.25 mg/dL — ABNORMAL HIGH (ref 0.57–1.00)
Glucose: 120 mg/dL — ABNORMAL HIGH (ref 70–99)
Potassium: 5.3 mmol/L — ABNORMAL HIGH (ref 3.5–5.2)
Sodium: 143 mmol/L (ref 134–144)
eGFR: 22 mL/min/{1.73_m2} — ABNORMAL LOW (ref >59)

## 2023-06-27 NOTE — Patient Outreach (Signed)
  Care Coordination   Follow Up Visit Note   06/27/2023 Name: Giabella Duhart MRN: [id number] DOB: [birth year]  Keyarra Rendall is a 78 y.o. year old female who sees Westley Chandler, MD for primary care. I spoke with  Lady Gary daughter S. Debnam by phone today.  What matters to the patients health and wellness today?  Completed follow up with S Bhullar. Advised pt moved into new assisted living facility.     Goals Addressed             This Visit's Progress    COMPLETED: Care Coodination       Collaborate with facility social worker at current assisted living to transfer care to another facility in [place], [place].  Contact [place], [place] to apply for long term care medicaid.  Engage in self care techniques to prevent caregiver burnout.  Goals reviewed with S Bossman  Pt is settled in new assisted living facility        SDOH assessments and interventions completed:  Yes  SDOH Interventions Today    Flowsheet Row Most Recent Value  SDOH Interventions   Food Insecurity Interventions Intervention Not Indicated  Housing Interventions Intervention Not Indicated  Transportation Interventions Intervention Not Indicated  Utilities Interventions Intervention Not Indicated        Care Coordination Interventions:  Yes, provided  Interventions Today    Flowsheet Row Most Recent Value  General Interventions   Level of Care Assisted Living  [S. Sedberry reports pt is settled in new assisted living facility in [place]]  Mental Health Interventions   Mental Health Discussed/Reviewed Mental Health Reviewed, Coping Strategies  [Caregiver support strategies provided]       Follow up plan: No further intervention required.   Encounter Outcome:  Patient Visit Completed    Gwyndolyn Saxon MSW, LCSW Licensed Clinical Social Worker  [facility], Population Health Direct Dial: [phone]  Fax: [phone]

## 2023-06-27 NOTE — Patient Instructions (Signed)
Visit Information  Thank you for taking time to visit with me today. Please don't hesitate to contact me if I can be of assistance to you.   Following are the goals we discussed today:   Goals Addressed             This Visit's Progress    COMPLETED: Care Coodination       Collaborate with facility social worker at current assisted living to transfer care to another facility in [place], [place].  Contact [place], [place] to apply for long term care medicaid.  Engage in self care techniques to prevent caregiver burnout.  Goals reviewed with S Iezzi  Pt is settled in new assisted living facility          Please call the care guide team at [phone] if you need to cancel or reschedule your appointment.   If you are experiencing a Mental Health or Behavioral Health Crisis or need someone to talk to, please call 911   Patient verbalizes understanding of instructions and care plan provided today and agrees to view in MyChart. Active MyChart status and patient understanding of how to access instructions and care plan via MyChart confirmed with patient.     The patient has been provided with contact information for the care management team and has been advised to call with any health related questions or concerns.   Gwyndolyn Saxon MSW, LCSW Licensed Clinical Social Worker  [facility], Population Health Direct Dial: [phone]  Fax: [phone]

## 2023-06-29 ENCOUNTER — Telehealth: Payer: Self-pay | Admitting: Family Medicine

## 2023-06-29 ENCOUNTER — Other Ambulatory Visit: Payer: Self-pay | Admitting: Family Medicine

## 2023-06-29 ENCOUNTER — Ambulatory Visit: Payer: PPO

## 2023-06-29 DIAGNOSIS — I639 Cerebral infarction, unspecified: Secondary | ICD-10-CM | POA: Diagnosis not present

## 2023-06-29 LAB — CUP PACEART REMOTE DEVICE CHECK
Date Time Interrogation Session: 20250129210812
Implantable Pulse Generator Implant Date: 20240102
Pulse Gen Serial Number: 511019975

## 2023-06-29 NOTE — Telephone Encounter (Signed)
Called daughter to discuss improvement of potassium from 5.9 to 5.3.  Patient is still taking her Lasix for leg swelling but reports that her legs are now back to normal.  Do not feel that Thompson Caul is needed at this time given that she had still been taking her Lasix and saw improvement of her potassium.  Does not appear to be on any medications that would increase her serum potassium.  Of note she had potassium supplementation previously documented but she has not been taking this so I removed it from her medication list.  Daughter has been unable to schedule nephrology appointment yet but plans to do this soon.  Of note daughter reports that she was unable to pick up some of the patient's blood pressure medications because it was too soon for a refill (apparently the patient's old ALF filled the prescriptions but then discontinued them and did not provide them to the patient when she moved out).   [facility] confirmed that there would be a $37 out-of-pocket cost for the patient to pick up the medications early as insurance would not cover an early refill.  I then attempted to call the nursing director at [facility] in [place] but was unable to reach her so left a voicemail message explaining the situation.  I did request that [facility] speak to the family directly regarding the medication mishap but also provided our clinic phone number if they would like to speak with me.  I updated the patient's daughter regarding these conversations, it does sound like she is willing to pay the out-of-pocket cost to pick up her medications early.  Scheduled for a visit with Dr. Marisue Humble Tuesday 2/4 where she should have her BP checked, meds reviewed, and recheck BMP to make sure her potassium is normalizing.  Vonna Drafts, MD

## 2023-07-03 ENCOUNTER — Telehealth: Payer: Self-pay | Admitting: *Deleted

## 2023-07-03 ENCOUNTER — Other Ambulatory Visit: Payer: Self-pay | Admitting: *Deleted

## 2023-07-03 NOTE — Patient Outreach (Signed)
  Care Management   Follow Up Note   07/03/2023 Name: Lauren Craig MRN: [id number] DOB: [birth year]   Referred by: Westley Chandler, MD Reason for referral : Care Management (RNCM: ATTEMPT #1 Follow Up For Chronic Disease Management & Care Coordination Needs)   An unsuccessful telephone outreach was attempted today. The patient was referred to the case management team for assistance with care management and care coordination.   Follow Up Plan: The care management team will reach out to the patient again over the next 30 days.   Larey Brick, BSN RN [facility] RN Care Manager Direct Dial: [phone]  Fax: [phone]

## 2023-07-04 ENCOUNTER — Ambulatory Visit (INDEPENDENT_AMBULATORY_CARE_PROVIDER_SITE_OTHER): Payer: PPO | Admitting: Student

## 2023-07-04 ENCOUNTER — Encounter: Payer: Self-pay | Admitting: Student

## 2023-07-04 VITALS — BP 204/82 | HR 69 | Ht 64.0 in | Wt 146.4 lb

## 2023-07-04 DIAGNOSIS — I10 Essential (primary) hypertension: Secondary | ICD-10-CM

## 2023-07-04 DIAGNOSIS — R600 Localized edema: Secondary | ICD-10-CM | POA: Diagnosis not present

## 2023-07-04 DIAGNOSIS — E875 Hyperkalemia: Secondary | ICD-10-CM | POA: Diagnosis not present

## 2023-07-04 NOTE — Progress Notes (Signed)
 SUBJECTIVE:   CHIEF COMPLAINT / HPI:   Ms. Huge is here accompanied by her daughter, Samule.   Hypertension Follow-up  Hyperkalemia  LE Edema  Possible Nephrotic Syndrome Here for follow-up after having been seen by me on 1/23 and Dr. Romelle on 1/27 for bilateral lower extremity edema thought possibly secondary to nephrotic syndrome given significantly elevated UACR and bump in her creatinine.  Of note she also had an elevated potassium of 5.9 that had come down to 5.3 by the time that she saw Dr. Romelle.  She has been referred to [facility] but has not yet seen them.  Sounds like maybe they have been calling her on her cell phone and she has not been telling Samule about this to get the appointment scheduled. She tells me that she feels well today.  Her lower extremity edema is resolved with as needed Lasix  use.  She denies any shortness of breath, chest pain, weakness, headache. She has not taken any of her antihypertensive medicines today.  She did take some yesterday but it is difficult to discern exactly what she is taking versus not taking.  She recently moved away from a assisted living facility that managed her medications and will now be managing her own medications.  Unfortunately, when she moved out the facility did not return her medications from their pharmacy supply and her daughter was only able to pick these up yesterday.  She brings them in today for us  to sort through and determine what she needs to be taking versus not.   PERTINENT  PMH / PSH: Prior CVA, vascular dementia, type 2 diabetes, hypertension, chronic kidney disease  OBJECTIVE:   BP (!) 204/82   Pulse 69   Ht 5' 4 (1.626 m)   Wt 146 lb 6.4 oz (66.4 kg)   SpO2 97%   BMI 25.13 kg/m   General: In good spirits and joking, chronically ill-appearing Neck: There is no JVD Cardio: Regular rate and rhythm without murmur Pulmonary: Normal work of breathing on room air, lungs are clear throughout, she  is speaking in full sentences Extremity: There is no pedal edema  ASSESSMENT/PLAN:   Assessment & Plan Primary hypertension Grossly elevated x 2 today.  I suspect some of this elevation may be rebound hypertension from her irregular clonidine  use over the past few days since leaving the facility.  We sorted through her medications today and obtained a pill organizer so that her daughter can help her organize her medications going forward. -Continue amlodipine  10 mg daily -Continue carvedilol  12.5 mg twice daily -Continue clonidine  0.1 mg twice daily -She previously was also on 4 times daily hydralazine  but it is unclear when the last time that she routinely took this was so we are going to hold it for now -Repeat BMP today -Return in 1 week for follow-up Bilateral leg edema Essentially resolved at this point.  Responded well to Lasix .  Based on her UACR results I am concerned that this may be nephrogenic edema.  Referral has been placed to [facility] by Dr. Delores. -I have given her daughter Samule the number to [facility] to call and make an appointment sooner rather than later -Okay to continue Lasix  dosing daily as needed should her edema return -Advised to come back ASAP should the edema be refractory to Lasix  Hyperkalemia K was 5.9 two weeks ago and I sent in a supply of Lokelma  but she never started this.  K was 5.3 last week when  Dr. Romelle saw her. -Repeat BMP today and start Lokelma  if indicated    J Con Gasman, MD [facility]

## 2023-07-04 NOTE — Patient Instructions (Addendum)
 Ms. Bridney, [place] to see you! Glad the swelling is under control.   Your BP is too high.   We need to get better control of this. I am making the following changes to your meds:  START Amlodipine  10mg  daily START Carvedilol  12.5mg  twice daily START Clonidine  0.1mg  twice daily  DO NOT Take the hydralazine  for now.   Buy a blood pressure cuff, please get OMRON brand as they are the most accurate.   Keep a BP log and bring it in to see me next Monday.  I am checking your renal function and potassium today and will call Samule if we need to make any changes.   JINNY Con Gasman, MD

## 2023-07-05 ENCOUNTER — Encounter: Payer: Self-pay | Admitting: Student

## 2023-07-05 LAB — BASIC METABOLIC PANEL
BUN/Creatinine Ratio: 16 (ref 12–28)
BUN: 41 mg/dL — ABNORMAL HIGH (ref 8–27)
CO2: 20 mmol/L (ref 20–29)
Calcium: 8.9 mg/dL (ref 8.7–10.3)
Chloride: 107 mmol/L — ABNORMAL HIGH (ref 96–106)
Creatinine, Ser: 2.63 mg/dL — ABNORMAL HIGH (ref 0.57–1.00)
Glucose: 114 mg/dL — ABNORMAL HIGH (ref 70–99)
Potassium: 5.6 mmol/L — ABNORMAL HIGH (ref 3.5–5.2)
Sodium: 140 mmol/L (ref 134–144)
eGFR: 18 mL/min/{1.73_m2} — ABNORMAL LOW (ref >59)

## 2023-07-06 NOTE — Assessment & Plan Note (Signed)
 Grossly elevated x 2 today.  I suspect some of this elevation may be rebound hypertension from her irregular clonidine  use over the past few days since leaving the facility.  We sorted through her medications today and obtained a pill organizer so that her daughter can help her organize her medications going forward. -Continue amlodipine  10 mg daily -Continue carvedilol  12.5 mg twice daily -Continue clonidine  0.1 mg twice daily -She previously was also on 4 times daily hydralazine  but it is unclear when the last time that she routinely took this was so we are going to hold it for now -Repeat BMP today -Return in 1 week for follow-up

## 2023-07-10 ENCOUNTER — Ambulatory Visit (INDEPENDENT_AMBULATORY_CARE_PROVIDER_SITE_OTHER): Payer: PPO | Admitting: Student

## 2023-07-10 VITALS — BP 156/56 | HR 54 | Ht 64.0 in | Wt 149.0 lb

## 2023-07-10 DIAGNOSIS — F39 Unspecified mood [affective] disorder: Secondary | ICD-10-CM

## 2023-07-10 DIAGNOSIS — I10 Essential (primary) hypertension: Secondary | ICD-10-CM | POA: Diagnosis not present

## 2023-07-10 DIAGNOSIS — E875 Hyperkalemia: Secondary | ICD-10-CM | POA: Diagnosis not present

## 2023-07-10 DIAGNOSIS — R600 Localized edema: Secondary | ICD-10-CM

## 2023-07-10 MED ORDER — FUROSEMIDE 20 MG PO TABS: 20.0000 mg | ORAL_TABLET | Freq: Every day | ORAL | 2 refills | Status: DC | PRN

## 2023-07-10 MED ORDER — ESCITALOPRAM OXALATE 20 MG PO TABS: 20.0000 mg | ORAL_TABLET | Freq: Every day | ORAL | 2 refills | Status: DC

## 2023-07-10 NOTE — Progress Notes (Signed)
    SUBJECTIVE:   CHIEF COMPLAINT / HPI:   HTN We have been adjusting her antihypertensive regimen since she left her assisted living facility in [place].  She is now living in a group home type situation and [place] and is managing her own medications with the assistance of her daughter Martie Lee.  At our last visit we got her a pill organizer so we now have a better idea of what she is taking and when.  She has been taking carvedilol 12.5 mg twice daily, amlodipine 5 mg daily, and clonidine 0.1 mg twice daily.  She also has Lasix for lower extremity edema that she has been taking on an as needed basis. Did not take this today but did have it the past three days. Does notice swelling on the days she does not take her Lasix.   Depression Feeling down, no "get up and go."  Has felt this way since moving out of her own home.  Has been on Lexapro 10mg  daily and Remeron 7.5mg  daily.  Would be interested in increasing her dose of Lexapro.   OBJECTIVE:   BP (!) 156/56   Pulse (!) 54   Ht 5\' 4"  (1.626 m)   Wt 149 lb (67.6 kg)   SpO2 100%   BMI 25.58 kg/m   Gen: Chronically ill appearing, a bit withdrawn today compared to my prior exams Neck: Without JVD Pulm: Normal WOB on RA, speaking in full sentences Ext: 1+ pitting edema to the lower 1/3 of the shin bilaterally  Mood: Depressed mood and affect   ASSESSMENT/PLAN:   Assessment & Plan Primary hypertension 156/56. BP control is probably as good as it is going to get.  Would not drive her systolic down further for concerns about further decreasing her diastolic pressure. -Continue carvedilol 12.5 mg twice daily -Continue amlodipine 5 mg daily -Continue clonidine 0.1 mg twice daily -Repeat BMP today Hyperkalemia Was previously prescribed Lokelma but never did pick this up.  It seems that when she takes her Lasix her potassium remains within acceptable limits, I suspect she is just going to need daily Lasix. -Repeat BMP  today as above -Advised to take her Lasix every day unless and until we or nephrology tells her otherwise Mood disorder (HCC) Depressed mood and affect on Lexapro 10 mg daily and Remeron 7.5 mg daily.  Amenable to increasing her Lexapro. -Increase Lexapro to 20 mg daily -PCP follow-up in 3 to 4 weeks Bilateral leg edema Has yet to see nephrology.  Her daughter does have the number to [facility] will be calling to make an appointment later today.     Eliezer Mccoy, MD [facility] [place]

## 2023-07-10 NOTE — Patient Instructions (Addendum)
[phone] is the number for [facility], please call them ASAP.  I am rechecking a BMP today, I will call Irena Manners if I need to send a new [facility] [address]   Take a Lasix  today. Call us  back tomorrow if the swelling doesn't respond. You should take this EVERY DAY.  Alexa Andrews, MD

## 2023-07-11 LAB — BASIC METABOLIC PANEL
BUN/Creatinine Ratio: 18 (ref 12–28)
BUN: 37 mg/dL — ABNORMAL HIGH (ref 8–27)
CO2: 18 mmol/L — ABNORMAL LOW (ref 20–29)
Calcium: 8.5 mg/dL — ABNORMAL LOW (ref 8.7–10.3)
Chloride: 108 mmol/L — ABNORMAL HIGH (ref 96–106)
Creatinine, Ser: 2.04 mg/dL — ABNORMAL HIGH (ref 0.57–1.00)
Glucose: 128 mg/dL — ABNORMAL HIGH (ref 70–99)
Potassium: 5.2 mmol/L (ref 3.5–5.2)
Sodium: 142 mmol/L (ref 134–144)
eGFR: 25 mL/min/{1.73_m2} — ABNORMAL LOW (ref >59)

## 2023-07-12 NOTE — Assessment & Plan Note (Signed)
Depressed mood and affect on Lexapro 10 mg daily and Remeron 7.5 mg daily.  Amenable to increasing her Lexapro. -Increase Lexapro to 20 mg daily -PCP follow-up in 3 to 4 weeks

## 2023-07-12 NOTE — Assessment & Plan Note (Signed)
156/56. BP control is probably as good as it is going to get.  Would not drive her systolic down further for concerns about further decreasing her diastolic pressure. -Continue carvedilol 12.5 mg twice daily -Continue amlodipine 5 mg daily -Continue clonidine 0.1 mg twice daily -Repeat BMP today

## 2023-07-24 ENCOUNTER — Other Ambulatory Visit: Payer: Self-pay | Admitting: Student

## 2023-07-24 DIAGNOSIS — R600 Localized edema: Secondary | ICD-10-CM

## 2023-07-25 ENCOUNTER — Other Ambulatory Visit: Payer: Self-pay | Admitting: Student

## 2023-07-25 DIAGNOSIS — R600 Localized edema: Secondary | ICD-10-CM

## 2023-07-26 ENCOUNTER — Telehealth: Payer: Self-pay | Admitting: *Deleted

## 2023-07-26 NOTE — Progress Notes (Signed)
 Complex Care Management Care Guide Note  07/26/2023 Name: Lauren Craig MRN: [id number] DOB: 24-Jul-1945  Lauren Craig is a 78 y.o. year old female who is a primary care patient of Westley Chandler, MD and is actively engaged with the care management team. I reached out to Lady Gary by phone today to assist with re-scheduling  with the RN Case Manager.  Follow up plan: Unsuccessful telephone outreach attempt made.   Gwenevere Ghazi  [facility]  Value-Based Care Institute, [facility] Guide  Direct Dial: [phone]  Fax [phone]

## 2023-07-31 ENCOUNTER — Encounter: Payer: Self-pay | Admitting: Family Medicine

## 2023-07-31 ENCOUNTER — Ambulatory Visit: Payer: PPO

## 2023-07-31 DIAGNOSIS — F39 Unspecified mood [affective] disorder: Secondary | ICD-10-CM

## 2023-07-31 DIAGNOSIS — R112 Nausea with vomiting, unspecified: Secondary | ICD-10-CM

## 2023-07-31 DIAGNOSIS — I639 Cerebral infarction, unspecified: Secondary | ICD-10-CM

## 2023-07-31 DIAGNOSIS — I63512 Cerebral infarction due to unspecified occlusion or stenosis of left middle cerebral artery: Secondary | ICD-10-CM

## 2023-07-31 DIAGNOSIS — I739 Peripheral vascular disease, unspecified: Secondary | ICD-10-CM

## 2023-07-31 DIAGNOSIS — I10 Essential (primary) hypertension: Secondary | ICD-10-CM

## 2023-07-31 DIAGNOSIS — R600 Localized edema: Secondary | ICD-10-CM

## 2023-07-31 LAB — CUP PACEART REMOTE DEVICE CHECK
Date Time Interrogation Session: 20250303030100
Implantable Pulse Generator Implant Date: 20240102
Pulse Gen Model: 5000
Pulse Gen Serial Number: 511019975

## 2023-08-01 MED ORDER — ROSUVASTATIN CALCIUM 10 MG PO TABS: 10.0000 mg | ORAL_TABLET | Freq: Every day | ORAL | 3 refills | Status: DC

## 2023-08-01 MED ORDER — CARVEDILOL 12.5 MG PO TABS: 12.5000 mg | ORAL_TABLET | Freq: Two times a day (BID) | ORAL | 3 refills | Status: DC

## 2023-08-01 MED ORDER — EZETIMIBE 10 MG PO TABS: 10.0000 mg | ORAL_TABLET | Freq: Every day | ORAL | 3 refills | Status: DC

## 2023-08-01 MED ORDER — CLONIDINE HCL 0.1 MG PO TABS: 0.1000 mg | ORAL_TABLET | Freq: Two times a day (BID) | ORAL | 0 refills | Status: DC

## 2023-08-01 MED ORDER — ASPIRIN 81 MG PO TBEC: 81.0000 mg | DELAYED_RELEASE_TABLET | Freq: Every day | ORAL | 12 refills | Status: DC

## 2023-08-01 MED ORDER — SUCRALFATE 1 GM/10ML PO SUSP
1.0000 g | Freq: Three times a day (TID) | ORAL | 0 refills | Status: DC
Start: 2023-08-01 — End: 2023-11-30

## 2023-08-01 MED ORDER — AMLODIPINE BESYLATE 5 MG PO TABS: 5.0000 mg | ORAL_TABLET | Freq: Every day | ORAL | 3 refills | Status: DC

## 2023-08-01 MED ORDER — ESCITALOPRAM OXALATE 20 MG PO TABS
20.0000 mg | ORAL_TABLET | Freq: Every day | ORAL | 3 refills | Status: DC
Start: 2023-08-01 — End: 2024-01-16

## 2023-08-01 MED ORDER — FAMOTIDINE 10 MG PO TABS: 10.0000 mg | ORAL_TABLET | Freq: Every day | ORAL | 3 refills | Status: DC

## 2023-08-01 MED ORDER — FUROSEMIDE 20 MG PO TABS
20.0000 mg | ORAL_TABLET | Freq: Every day | ORAL | 0 refills | Status: DC | PRN
Start: 2023-08-01 — End: 2023-11-28

## 2023-08-03 NOTE — Progress Notes (Signed)
 Complex Care Management Care Guide Note  08/03/2023 Name: Lauren Craig MRN: [id number] DOB: Oct 29, 1945  Lauren Craig is a 78 y.o. year old female who is a primary care patient of Westley Chandler, MD and is actively engaged with the care management team. I reached out to Lady Gary by phone today to assist with re-scheduling  with the RN Case Manager.  Follow up plan: Unsuccessful telephone outreach attempt made. A HIPAA compliant phone message was left for the patient providing contact information and requesting a return call. No further outreach attempts will be made at this time. We have been unable to contact the patient to reschedule for complex care management services.   Gwenevere Ghazi  [facility]  Value-Based Care Institute, [facility] Guide  Direct Dial: [phone]  Fax [phone]

## 2023-08-04 NOTE — Progress Notes (Signed)
 Carelink Summary Report / Loop Recorder

## 2023-08-07 ENCOUNTER — Ambulatory Visit: Payer: PPO | Admitting: Family Medicine

## 2023-08-18 ENCOUNTER — Ambulatory Visit: Admitting: Family Medicine

## 2023-08-18 ENCOUNTER — Encounter: Payer: Self-pay | Admitting: Family Medicine

## 2023-08-18 VITALS — BP 183/54 | HR 58 | Wt 150.4 lb

## 2023-08-18 DIAGNOSIS — F39 Unspecified mood [affective] disorder: Secondary | ICD-10-CM | POA: Diagnosis not present

## 2023-08-18 DIAGNOSIS — I10 Essential (primary) hypertension: Secondary | ICD-10-CM | POA: Diagnosis not present

## 2023-08-18 NOTE — Assessment & Plan Note (Signed)
 Blood pressure uncontrolled today, on 3 different medications at this time.  Soft diastolic pressure, hesitant to increase dosing at this time.  Scheduled for ambulatory BP monitoring with Dr. Raymondo Band on 4/1.  Also scheduled for PCP visit 4/3.

## 2023-08-18 NOTE — Patient Instructions (Addendum)
 For information on therapists, please go to [email or web address]. You can also contact your insurance company to find an in-network therapist.  See Dr Raymondo Band 4/1 and Dr Manson Passey 4/3

## 2023-08-18 NOTE — Progress Notes (Signed)
    SUBJECTIVE:   CHIEF COMPLAINT / HPI:   Mood disorder Had depressed mood on Lexapro and Remeron.  At last visit 2/10 her Lexapro was increased to 20 mg daily -feeling down -doesn't leave her room -since moving to new facility ([facility] [address]) and interacting with "crazy "residents there -doesn't want to do anything or talk to anyone   Hypertension On Coreg 12.5 mg twice daily, amlodipine 5 mg daily, clonidine 0.1 mg twice daily Reports adherence Denies SOB, CP, Headaches, visual changes Not checking BP at home  [facility] appt on Tuesday   PERTINENT  PMH / PSH: HTN, mood disorder  OBJECTIVE:   BP (!) 183/54   Pulse (!) 58   Wt 150 lb 6.4 oz (68.2 kg)   SpO2 98%   BMI 25.82 kg/m   General: NAD, pleasant, able to participate in exam Respiratory: No respiratory distress Skin: warm and dry, no rashes noted Psych: Normal affect and mood  ASSESSMENT/PLAN:   Assessment & Plan Mood disorder (HCC) At last visit Lexapro was increased to 20 mg daily.  She has been adherent with this.  However, still experiencing some depressed mood and decreased interest in performing activities that she typically enjoys.  However, I suspect that this is largely due to environmental changes especially after moving to her new facility.  After further discussion it seems that her mood greatly improved when she is around some friends at her facility as well as when she is around her daughter.  It seems like they are interested in therapy. -Provided therapy resources [email or web address] -Encouraged to continue Lexapro for now -Plan for daughter to try and get the patient home or outside more often -Plan for patient to continue going to events at her facility and making new friends which she is motivated and optimistic about Primary hypertension Blood pressure uncontrolled today, on 3 different medications at this time.  Soft diastolic pressure, hesitant to increase  dosing at this time.  Scheduled for ambulatory BP monitoring with Dr. Raymondo Band on 4/1.  Also scheduled for PCP visit 4/3.   Vonna Drafts, MD [facility]

## 2023-08-18 NOTE — Assessment & Plan Note (Signed)
 At last visit Lexapro was increased to 20 mg daily.  She has been adherent with this.  However, still experiencing some depressed mood and decreased interest in performing activities that she typically enjoys.  However, I suspect that this is largely due to environmental changes especially after moving to her new facility.  After further discussion it seems that her mood greatly improved when she is around some friends at her facility as well as when she is around her daughter.  It seems like they are interested in therapy. -Provided therapy resources [email or web address] -Encouraged to continue Lexapro for now -Plan for daughter to try and get the patient home or outside more often -Plan for patient to continue going to events at her facility and making new friends which she is motivated and optimistic about

## 2023-08-22 DIAGNOSIS — D631 Anemia in chronic kidney disease: Secondary | ICD-10-CM | POA: Diagnosis not present

## 2023-08-22 DIAGNOSIS — I5030 Unspecified diastolic (congestive) heart failure: Secondary | ICD-10-CM | POA: Diagnosis not present

## 2023-08-22 DIAGNOSIS — N189 Chronic kidney disease, unspecified: Secondary | ICD-10-CM | POA: Diagnosis not present

## 2023-08-22 DIAGNOSIS — R809 Proteinuria, unspecified: Secondary | ICD-10-CM | POA: Diagnosis not present

## 2023-08-22 DIAGNOSIS — I129 Hypertensive chronic kidney disease with stage 1 through stage 4 chronic kidney disease, or unspecified chronic kidney disease: Secondary | ICD-10-CM | POA: Diagnosis not present

## 2023-08-22 DIAGNOSIS — E875 Hyperkalemia: Secondary | ICD-10-CM | POA: Diagnosis not present

## 2023-08-22 DIAGNOSIS — N184 Chronic kidney disease, stage 4 (severe): Secondary | ICD-10-CM | POA: Diagnosis not present

## 2023-08-28 ENCOUNTER — Ambulatory Visit: Admitting: Pharmacist

## 2023-08-28 ENCOUNTER — Other Ambulatory Visit: Payer: Self-pay | Admitting: Pharmacist

## 2023-08-28 NOTE — Progress Notes (Signed)
 Attempted to contact patient for discussion of Medication Adherence with Rosuvastatin.   Last fill noted 06/08/2023  for #30 Patient at risk for non-adherence with statin use for 2025 by 09/18/2023  Voice Mail FULL - no message left.   Total time with patient call and documentation of interaction: 4 minutes.  Follow-up phone call planned: None

## 2023-08-29 ENCOUNTER — Ambulatory Visit: Admitting: Pharmacist

## 2023-08-29 ENCOUNTER — Encounter: Payer: Self-pay | Admitting: Pharmacist

## 2023-08-29 VITALS — BP 182/70 | Wt 154.8 lb

## 2023-08-29 DIAGNOSIS — I10 Essential (primary) hypertension: Secondary | ICD-10-CM | POA: Diagnosis not present

## 2023-08-29 NOTE — Patient Instructions (Signed)
 Blood Pressure Activity Diary Time Lying down/ Sleeping Walking/ Exercise Stressed/ Angry Headache/ Pain Dizzy  1 PM       2 PM       Time Lying down/ Sleeping Walking/ Exercise Stressed/ Angry Headache/ Pain Dizzy  3 PM       4 PM        5 PM       6 PM       7 PM       8 PM       Time Lying down/ Sleeping Walking/ Exercise Stressed/ Angry Headache/ Pain Dizzy  9 PM       10 PM       11 PM       12 AM       1 AM       2 AM       3 AM       Time Lying down/ Sleeping Walking/ Exercise Stressed/ Angry Headache/ Pain Dizzy  4 AM       5 AM       6 AM       7 AM       8 AM        9 AM       10 AM       11 AM       12 PM       1 PM       2 PM       Time Lying down/ Sleeping Walking/ Exercise Stressed/ Angry Headache/ Pain Dizzy  3 PM       4 PM        5 PM       6 PM        Time you woke up: _________                  Time you went to sleep:__________  Come back 9:50 [facility] on 4/3 to have the monitor removed  Call the [facility] if you have any questions before then [phone])

## 2023-08-29 NOTE — Assessment & Plan Note (Signed)
 History of isolated systolic hypertension longstanding 6 years (2019) currently taking amlodipine, furosemide, carvedilol, and clonidine; with goal pressure of <140 mmHg systolic.     -Placed blood pressure cuff, provided education, patient wide pulse pressure and bradycardia created multiple errors with ambulatory blood pressure monitor placed at various sites and multiple cuffs attempted.  Following multiple errors, decided to trial regular home monitor which was provided for use to attempt  5 pressures per day, for the next two days, prior to return to office visit  08/31/2023 with Dr. Manson Passey. Written patient instructions provided including activity/symptom/event log. Patient verbalized understanding of plan.

## 2023-08-29 NOTE — Progress Notes (Signed)
 S:     Chief Complaint  Patient presents with   Medication Management    Amb BP monitoring - attempted / set-up with home monitor for intermittent assessment.    78 y.o. female who presents for hypertension evaluation, education, and management. Patient arrives in good spirits and presents with assistance of rolling walker. Patient is accompanied by her daughter, Lauren Craig.   PMH is significant for HTN, mood disorder.  Patient was referred and last seen by Primary Care Provider, Dr. Barb Merino, on 08/18/23.   At last visit, Lexapro (escitalopram) dose was increased to 20 mg daily.   Diagnosed with Hypertension in the year of 2019.    Medication compliance is reported to be fair. Patient reports missing doses of amlodipine about 3-4 times per week.  Discussed procedure for wearing the monitor and gave patient written instructions. Monitor was placed on non-dominant arm with instructions to return in the morning.   Current BP Medications include:  amlodipine 5 mg, furosemide 20 mg, carvedilol 12.5 mg BID, clonidine 0.1 mg BID  Antihypertensives tried in the past include: enalapril  O:  Review of Systems  Neurological:  Negative for dizziness.  All other systems reviewed and are negative.   Physical Exam Vitals reviewed.  Constitutional:      Appearance: Normal appearance.  Pulmonary:     Effort: Pulmonary effort is normal.  Neurological:     Mental Status: She is alert.  Psychiatric:        Mood and Affect: Mood normal.        Behavior: Behavior normal.        Thought Content: Thought content normal.        Judgment: Judgment normal.     Last 3 Office BP readings: BP Readings from Last 3 Encounters:  08/29/23 (!) 182/70  08/18/23 (!) 183/54  07/10/23 (!) 156/56    Clinical Atherosclerotic Cardiovascular Disease (ASCVD): Yes  The ASCVD Risk score (Arnett DK, et al., 2019) failed to calculate for the following reasons:   Risk score cannot be calculated because patient  has a medical history suggesting prior/existing ASCVD  Basic Metabolic Panel    Component Value Date/Time   NA 142 07/10/2023 1711   K 5.2 07/10/2023 1711   CL 108 (H) 07/10/2023 1711   CO2 18 (L) 07/10/2023 1711   GLUCOSE 128 (H) 07/10/2023 1711   GLUCOSE 97 05/31/2022 0353   BUN 37 (H) 07/10/2023 1711   CREATININE 2.04 (H) 07/10/2023 1711   CALCIUM 8.5 (L) 07/10/2023 1711   GFRNONAA 27 (L) 05/31/2022 0353   GFRAA >60 11/11/2019 1200    For Office Goal BP of <140 mmHg Systolic - lower if tolerated:  Home daytime BP <135 mmHg if tolerated.    A/P: History of isolated systolic hypertension longstanding 6 years (2019) currently taking amlodipine, furosemide, carvedilol, and clonidine; with goal pressure of <140 mmHg systolic.     -Placed blood pressure cuff, provided education, patient wide pulse pressure and bradycardia created multiple errors with ambulatory blood pressure monitor placed at various sites and multiple cuffs attempted.  Following multiple errors, decided to trial regular home monitor which was provided for use to attempt  5 pressures per day, for the next two days, prior to return to office visit  08/31/2023 with Dr. Manson Passey. Written patient instructions provided including activity/symptom/event log. Patient verbalized understanding of plan.  Total time in face to face counseling and blood pressure monitor treatment planning: 28 minutes.    Follow-up: 4/3 with Dr.  Manson Passey.   Patient seen with Threasa Heads, PharmD Candidate and Darolyn Rua, PharmD Candidate.

## 2023-08-30 NOTE — Patient Instructions (Incomplete)
 It was wonderful to see you today.  Please bring ALL of your medications with you to every visit.   Today we talked about:  Please call [facility]  Address: [address], [place], [place] [address] Phone: [phone]\  Please call [facility]  [facility] [address] [place]  [phone]  I recommend you undergo a DEXA.   You can call to schedule an appointment by calling [phone].   Directions [address] [place], [place] [address]  Please let me know if you have questions. I will send you a letter or call you with results.   I have referred you to Cardiology  to further evaluate your concern. If you do not received a phone call about this appointment within 3-4 weeks, please call our office back at [phone]. Clemencia Course coordinates our referrals and can assist you in this.     Please follow up in 1-2 months   Thank you for choosing [facility].   Please call [phone] with any questions about today's appointment.  Please be sure to schedule follow up at the front  desk before you leave today.   Terisa Starr, MD  Family Medicine

## 2023-08-30 NOTE — Progress Notes (Unsigned)
 SUBJECTIVE:   CHIEF COMPLAINT: HTN HPI:   Beau Vanduzer is a 78 y.o.  with history notable for CKD, HTN, CVA, and type 2 DM  presenting for follow up and rash.   Patient saw Dr. Derek Jack last week at Nephrology.   She is living at ALF. Daughter makes pill packs weekly. Taking all pills and intranasal steroids as prescribed. Did not take morning medications today.  The patient has history of gastrointestinal difficulties including chronic nausea vomiting diarrhea and esophagitis.  She reports this has improved.  However the over the last 2 weeks she has been having pain with swelling she reports that food will go down but she did have an episode where a pill got stuck earlier this week.  She endorses pain with swallowing over the last 2 weeks.  She does not feel as though there is a mass in her throat.  She is a former smoker.  She has not yet scheduled her gastroenterology appointment.  She does report her allergies are worse and she feels that is related to this.  The patient reports 2 weeks of congestion and nasal drainage.  She feels this is making her throat sore and difficult to swallow.  She also notices a change in her voice.  She denies fevers, chills, nausea, vomiting or change in stool pattern she denies any melena hematochezia.  The patient denies headaches, chest pains, dyspnea on exertion.  She did have a 1 minute episode of chest pain several weeks ago that was exertional.  She has a Linq recorder in but has never had an ischemic evaluation.  Her last echocardiogram was 2 months ago  The patient has uncontrolled hypertension.  She did a home monitor with a cuff from our pharmacist.  This demonstrates a very wide pulse pressure with diastolics between 6070s to 80s and systolics in the 160s to 200s. PERTINENT  PMH / PSH/Family/Social History : History of vascular disease, diet-controlled diabetes.  OBJECTIVE:   BP (!) 186/59   Pulse 65   Ht 5\' 4"  (1.626 m)   Wt 154 lb  (69.9 kg)   SpO2 96%   BMI 26.43 kg/m   Today's weight:  Last Weight  Most recent update: 08/31/2023  9:54 AM    Weight  69.9 kg (154 lb)            Review of prior weights: [employer]   08/31/23 0951  Weight: 154 lb (69.9 kg)   HEENT: EOMI. MMM. External auditory canal examined and WNL.  Has evidence of postnasal drip in oropharynx there are no masses visible.  Dentition is poor.  Poor dentition Neck: Supple.  There is no lymphadenopathy. Cardiac: Regular rate and rhythm. Normal S1/S2. No murmurs, rubs, or gallops appreciated. Lungs: Clear bilaterally to ascultation.  Skin exam she has an erythematous scaling rash under her bilateral breasts and in infra umbilical area Neuro: Hoarseness of Ext: Bilateral 1+ edema on the right 2+ on the left consistent from prior exams Psych: Pleasant and appropriate    ASSESSMENT/PLAN:   Assessment & Plan Dysphagia, unspecified type Concerning for esophageal and oropharyngeal pathology  SLP referral If hoarseness persists after treatment for allergies will need ENT Referral back to GI for endoscopic evaluation  Hypertension, unspecified type Discussed at length with her and her daughter.  Discussed the risk of stroke and ongoing complications.  We are in a difficult place as her pulse pressure is quite wide.  Will message Dr. Glenna Fellows to see if  increasing clonidine is warranted.  Given difficulty controlling hypertension and remote episode of chest pain referral to cardiology placed.  We discussed if she has another episode of chest pain she needs to report immediately to the emergency department. Controlled type 2 diabetes mellitus with complication, without long-term current use of insulin (HCC) A1c at goal. Number given to [facility] Encounter for immunization COVID booster given Urinary incontinence, unspecified type The patient has longstanding urge and stress incontinence.  She wears incontinence pads throughout the day.  This  prevent skin breakdown.  To reduce the risk of pressure ulcer she requires 4 times daily changes to incontinence pads. Seasonal allergic rhinitis due to pollen Suspect this may be contributing to her hoarseness and dysphagia however other pathologies must be considered as above.  Start antihistamine continue Flonase Intertrigo Nystatin cream Stage 3b chronic kidney disease (HCC) Updated problem.   Terisa Starr, MD  Family Medicine Teaching Service  [facility]

## 2023-08-31 ENCOUNTER — Ambulatory Visit (INDEPENDENT_AMBULATORY_CARE_PROVIDER_SITE_OTHER): Admitting: Family Medicine

## 2023-08-31 ENCOUNTER — Encounter: Payer: Self-pay | Admitting: Family Medicine

## 2023-08-31 ENCOUNTER — Ambulatory Visit (INDEPENDENT_AMBULATORY_CARE_PROVIDER_SITE_OTHER): Payer: PPO

## 2023-08-31 VITALS — BP 186/59 | HR 65 | Ht 64.0 in | Wt 154.0 lb

## 2023-08-31 DIAGNOSIS — R32 Unspecified urinary incontinence: Secondary | ICD-10-CM | POA: Diagnosis not present

## 2023-08-31 DIAGNOSIS — E118 Type 2 diabetes mellitus with unspecified complications: Secondary | ICD-10-CM

## 2023-08-31 DIAGNOSIS — N1832 Chronic kidney disease, stage 3b: Secondary | ICD-10-CM | POA: Diagnosis not present

## 2023-08-31 DIAGNOSIS — I10 Essential (primary) hypertension: Secondary | ICD-10-CM | POA: Diagnosis not present

## 2023-08-31 DIAGNOSIS — I639 Cerebral infarction, unspecified: Secondary | ICD-10-CM

## 2023-08-31 DIAGNOSIS — L304 Erythema intertrigo: Secondary | ICD-10-CM

## 2023-08-31 DIAGNOSIS — Z23 Encounter for immunization: Secondary | ICD-10-CM

## 2023-08-31 DIAGNOSIS — J301 Allergic rhinitis due to pollen: Secondary | ICD-10-CM

## 2023-08-31 DIAGNOSIS — R1310 Dysphagia, unspecified: Secondary | ICD-10-CM

## 2023-08-31 LAB — POCT GLYCOSYLATED HEMOGLOBIN (HGB A1C): HbA1c, POC (controlled diabetic range): 6.6 % (ref 0.0–7.0)

## 2023-08-31 LAB — CUP PACEART REMOTE DEVICE CHECK
Date Time Interrogation Session: 20250402220205
Implantable Pulse Generator Implant Date: 20240102
Pulse Gen Model: 5000
Pulse Gen Serial Number: 511019975

## 2023-08-31 MED ORDER — NYSTATIN 100000 UNIT/GM EX CREA: 1.0000 | TOPICAL_CREAM | Freq: Four times a day (QID) | CUTANEOUS | 3 refills | Status: DC

## 2023-08-31 MED ORDER — FEXOFENADINE HCL 60 MG PO TABS: 60.0000 mg | ORAL_TABLET | Freq: Two times a day (BID) | ORAL | 1 refills | Status: DC

## 2023-08-31 NOTE — Assessment & Plan Note (Signed)
 Updated problem.

## 2023-08-31 NOTE — Assessment & Plan Note (Signed)
 Discussed at length with her and her daughter.  Discussed the risk of stroke and ongoing complications.  We are in a difficult place as her pulse pressure is quite wide.  Will message Dr. Glenna Fellows to see if increasing clonidine is warranted.  Given difficulty controlling hypertension and remote episode of chest pain referral to cardiology placed.  We discussed if she has another episode of chest pain she needs to report immediately to the emergency department.

## 2023-08-31 NOTE — Progress Notes (Signed)
 Reviewed and agree with Dr Macky Lower plan.

## 2023-08-31 NOTE — Progress Notes (Signed)
Merlin loop Recorder 

## 2023-08-31 NOTE — Assessment & Plan Note (Signed)
 A1c at goal. Number given to [facility]

## 2023-09-01 ENCOUNTER — Encounter: Payer: Self-pay | Admitting: Family Medicine

## 2023-09-01 LAB — BASIC METABOLIC PANEL WITH GFR
BUN/Creatinine Ratio: 18 (ref 12–28)
BUN: 36 mg/dL — ABNORMAL HIGH (ref 8–27)
CO2: 19 mmol/L — ABNORMAL LOW (ref 20–29)
Calcium: 9.1 mg/dL (ref 8.7–10.3)
Chloride: 107 mmol/L — ABNORMAL HIGH (ref 96–106)
Creatinine, Ser: 1.97 mg/dL — ABNORMAL HIGH (ref 0.57–1.00)
Glucose: 165 mg/dL — ABNORMAL HIGH (ref 70–99)
Potassium: 5.3 mmol/L — ABNORMAL HIGH (ref 3.5–5.2)
Sodium: 139 mmol/L (ref 134–144)
eGFR: 26 mL/min/{1.73_m2} — ABNORMAL LOW (ref >59)

## 2023-09-06 ENCOUNTER — Other Ambulatory Visit: Payer: Self-pay | Admitting: Family Medicine

## 2023-09-06 ENCOUNTER — Ambulatory Visit
Admission: RE | Admit: 2023-09-06 | Discharge: 2023-09-06 | Disposition: A | Discharge status: Home / Self Care | Source: Ambulatory Visit | Attending: Family Medicine | Admitting: Family Medicine

## 2023-09-06 DIAGNOSIS — M79671 Pain in right foot: Secondary | ICD-10-CM

## 2023-09-06 DIAGNOSIS — S9031XA Contusion of right foot, initial encounter: Secondary | ICD-10-CM | POA: Diagnosis not present

## 2023-09-06 NOTE — Progress Notes (Signed)
 X-ray ordered.

## 2023-09-06 NOTE — Progress Notes (Signed)
 Daughter sent mychart with bruised foot. Recommend evaluation and x-ray. Patient declined. Both aware of risks including uncontrolled pain, foot deformity, mobility issues.  Terisa Starr, MD  Family Medicine Teaching Service

## 2023-09-07 ENCOUNTER — Encounter: Payer: Self-pay | Admitting: Family Medicine

## 2023-09-15 ENCOUNTER — Telehealth: Payer: Self-pay | Admitting: *Deleted

## 2023-09-15 NOTE — Telephone Encounter (Signed)
 Alert received from [employer]:  Alert remote transmission: Nola episode. 1 Brady episode, 12 sec on 09/04/23 at 8:32 pm, min V rate 25 bpm; first [place] event, to Triage. Alert protocol reprogrammed to CVA, no OAC. __________________________________________________________________________________  This RN spoke with the patient and her daughter Lanney) regarding the event.  Patient stated that she felt tired during the time of the event, but never lost consciousness and feels alright now.  Patient slow to respond to RN's questions with slurred speech that is known and documented in EPIC. Patient confirmed taking all her medications yesterday.  Will route to MD for advisement and any recommendations.

## 2023-09-15 NOTE — Telephone Encounter (Signed)
 Patient needs appt with EP APP per Dr. Lawana Pray.

## 2023-10-02 ENCOUNTER — Ambulatory Visit (INDEPENDENT_AMBULATORY_CARE_PROVIDER_SITE_OTHER): Payer: PPO

## 2023-10-02 DIAGNOSIS — I639 Cerebral infarction, unspecified: Secondary | ICD-10-CM

## 2023-10-02 LAB — CUP PACEART REMOTE DEVICE CHECK
Date Time Interrogation Session: 20250504150046
Implantable Pulse Generator Implant Date: 20240102
Pulse Gen Model: 5000
Pulse Gen Serial Number: 511019975

## 2023-10-09 NOTE — Progress Notes (Signed)
 Carelink Summary Report / Loop Recorder

## 2023-10-11 DIAGNOSIS — I129 Hypertensive chronic kidney disease with stage 1 through stage 4 chronic kidney disease, or unspecified chronic kidney disease: Secondary | ICD-10-CM | POA: Diagnosis not present

## 2023-10-11 DIAGNOSIS — D631 Anemia in chronic kidney disease: Secondary | ICD-10-CM | POA: Diagnosis not present

## 2023-10-11 DIAGNOSIS — R809 Proteinuria, unspecified: Secondary | ICD-10-CM | POA: Diagnosis not present

## 2023-10-11 DIAGNOSIS — I5030 Unspecified diastolic (congestive) heart failure: Secondary | ICD-10-CM | POA: Diagnosis not present

## 2023-10-11 DIAGNOSIS — N184 Chronic kidney disease, stage 4 (severe): Secondary | ICD-10-CM | POA: Diagnosis not present

## 2023-10-11 DIAGNOSIS — E875 Hyperkalemia: Secondary | ICD-10-CM | POA: Diagnosis not present

## 2023-10-13 ENCOUNTER — Encounter: Payer: Self-pay | Admitting: Student

## 2023-10-13 ENCOUNTER — Ambulatory Visit: Attending: Student | Admitting: Student

## 2023-10-13 ENCOUNTER — Telehealth: Payer: Self-pay | Admitting: Family Medicine

## 2023-10-13 VITALS — BP 180/66 | HR 56 | Ht 65.5 in | Wt 156.0 lb

## 2023-10-13 DIAGNOSIS — I10 Essential (primary) hypertension: Secondary | ICD-10-CM

## 2023-10-13 DIAGNOSIS — I639 Cerebral infarction, unspecified: Secondary | ICD-10-CM | POA: Diagnosis not present

## 2023-10-13 DIAGNOSIS — R001 Bradycardia, unspecified: Secondary | ICD-10-CM

## 2023-10-13 NOTE — Telephone Encounter (Signed)
 [employer],  Please schedule this patient for follow up with me for HTN in June sometime.  Thanks, Otho Blitz, MD  [facility] Medicine Teaching Service

## 2023-10-13 NOTE — Patient Instructions (Signed)
 Medication Instructions:  No medication changes today. *If you need a refill on your cardiac medications before your next appointment, please call your pharmacy*  Lab Work: No labwork ordered today. If you have labs (blood work) drawn today and your tests are completely normal, you will receive your results only by: MyChart Message (if you have MyChart) OR A paper copy in the mail If you have any lab test that is abnormal or we need to change your treatment, we will call you to review the results.  Testing/Procedures: No testing ordered today  Follow-Up: At [facility], you and your health needs are our priority.  As part of our continuing mission to provide you with exceptional heart care, our providers are all part of one team.  This team includes your primary Cardiologist (physician) and Advanced Practice Providers or APPs (Physician Assistants and Nurse Practitioners) who all work together to provide you with the care you need, when you need it.  Your next appointment:   As needed  Provider:   You may see Will Cortland Ding, MD or one of the following Advanced Practice Providers on your designated Care Team:   Mertha Abrahams, [place] [address]" [place], PA-C Suzann Riddle, NP Creighton Doffing, NP

## 2023-10-13 NOTE — Progress Notes (Addendum)
   Electrophysiology Office Note:   Date:  10/13/2023  ID:  Lauren Craig, DOB 07-07-1945, MRN [id number]  Primary Cardiologist: None Electrophysiologist: Will Cortland Ding, MD      History of Present Illness:   Lauren Craig is a 78 y.o. female with h/o CVA, s/p ILR for cardiac monitoring, and HTN seen today for acute visit due to bradycardia noted on her loop recorder.    Patient reports well. She has mild lightheadedness at times, but no syncope. Denies CP or SOB. Doesn't remember having specific symptoms during the period on her device. She has not had brady episodes since then. She is taking all medication as directed. BP consistently runs high at MD offices, PCP is actively managing.   Review of systems complete and found to be negative unless listed in HPI.   Studies Reviewed:    EKG is ordered today. Personal review as below.  EKG Interpretation Date/Time:  Friday Oct 13 2023 12:42:25 EDT Ventricular Rate:  54 PR Interval:  196 QRS Duration:  84 QT Interval:  458 QTC Calculation: 434 R Axis:   -19  Text Interpretation: Sinus bradycardia T wave abnormality, consider lateral ischemia When compared with ECG of 15-May-2022 12:01, Vent. rate has decreased BY  28 BPM Confirmed by Pilar Bridge [phone]) on 10/13/2023 12:44:14 PM    Arrhythmia/Device History Abbott ILR 05/2022 for CVA   Physical Exam:   VS:  BP (!) 180/66 (BP Location: Left Arm, Patient Position: Sitting, Cuff Size: Normal)   Pulse (!) 56   Ht 5' 5.5" (1.664 m)   Wt 156 lb (70.8 kg)   SpO2 92%   BMI 25.56 kg/m    Wt Readings from Last 3 Encounters:  10/13/23 156 lb (70.8 kg)  08/31/23 154 lb (69.9 kg)  08/29/23 154 lb 12.8 oz (70.2 kg)     GEN: No acute distress NECK: No JVD; No carotid bruits CARDIAC: Regular rate and rhythm, no murmurs, rubs, gallops RESPIRATORY:  Clear to auscultation without rales, wheezing or rhonchi  ABDOMEN: Soft, non-tender, non-distended EXTREMITIES:  No edema; No  deformity   ILR Interrogation- reviewed in detail today,  See PACEART report  ASSESSMENT AND PLAN:    Cryptogenic Stroke s/p Abbott Loop recorder Normal device function See Pace Art report No changes today  Bradycardia Appears to have had brief heart block with escape rhythm vs sinus bradycardia by EGMs With isolated and asymptomatic bradycardia, OK to continue coreg  for now given her BP management goals.  Would NOT uptitrate her beta blocker. If she has further bradycardia, would stop Coreg  and get close PCP follow up for further HTN management in its absence.   HTN PCP actively managing She has been referred to general cardiology as well, will make sure referral is processed  Follow up with EP as needed in person. Will continue monitoring with her loop recorder.   Signed, Tylene Galla, PA-C

## 2023-10-19 ENCOUNTER — Other Ambulatory Visit: Payer: Self-pay | Admitting: Family Medicine

## 2023-10-19 DIAGNOSIS — J310 Chronic rhinitis: Secondary | ICD-10-CM

## 2023-10-30 ENCOUNTER — Encounter: Payer: Self-pay | Admitting: Family Medicine

## 2023-11-01 LAB — CUP PACEART REMOTE DEVICE CHECK
Date Time Interrogation Session: 20250604050120
Implantable Pulse Generator Implant Date: 20240102
Pulse Gen Model: 5000
Pulse Gen Serial Number: 511019975

## 2023-11-02 ENCOUNTER — Ambulatory Visit (INDEPENDENT_AMBULATORY_CARE_PROVIDER_SITE_OTHER): Payer: PPO

## 2023-11-02 DIAGNOSIS — I639 Cerebral infarction, unspecified: Secondary | ICD-10-CM

## 2023-11-13 ENCOUNTER — Ambulatory Visit: Payer: Self-pay | Admitting: Cardiology

## 2023-11-20 NOTE — Progress Notes (Signed)
 Merlin Loop [employer]

## 2023-11-21 ENCOUNTER — Ambulatory Visit: Attending: Cardiology | Admitting: Cardiology

## 2023-11-21 NOTE — Progress Notes (Deleted)
  Cardiology Office Note:  .   Date:  11/21/2023  ID:  Lauren Craig, DOB [birth year], MRN [id number] PCP: Delores Suzann HERO, MD  [facility] Providers Cardiologist:  Newman Lawrence, MD PCP: Delores Suzann HERO, MD  No chief complaint on file.    Lauren Craig is a 78 y.o. female with hypertension, hyperlipidemia, type 2 DM, PAD, h/o stroke, asymptomatic bradycardia  Discussed the use of AI scribe software for clinical note transcription with the patient, who gave verbal consent to proceed.  History of Present Illness       There were no vitals filed for this visit.    ROS      Studies Reviewed: .        *** Labs ***/202***: Chol ***, TG ***, HDL ***, LDL *** HbA1C ***% Hb *** Cr *** ***  Risk Assessment/Calculations:   {Does this patient have ATRIAL FIBRILLATION?:[phone]}    Physical Exam   VISIT DIAGNOSES: No diagnosis found.   Lauren Craig is a 78 y.o. female with *** Assessment and Plan Assessment & Plan       {Are you ordering a CV Procedure (e.g. stress test, cath, DCCV, TEE, etc)?   Press F2        :[id number]}    No orders of the defined types were placed in this encounter.    F/u in ***  Signed, Newman JINNY Lawrence, MD

## 2023-11-22 ENCOUNTER — Encounter: Payer: Self-pay | Admitting: Cardiology

## 2023-11-24 NOTE — Progress Notes (Unsigned)
    SUBJECTIVE:   CHIEF COMPLAINT: mood HPI:   Lauren Craig is a 78 y.o.  with history notable for recurrent strokes with intercranial hemorrhage, memory impairment, very well-controlled type 2 diabetes, difficult to control hypertension presenting for follow-up with her daughter.   She overall is doing okay. Living at [facility] in [place]. [name] (Daughter) helps with pill packs. She is leading bible group. Going to classes regularly. Staying engaged at the center. No falls.  HTN Did not take medications today. Recently saw Nephrology who increased her Lasix  for a few days. No headaches, chest pain, or breathing issues. LE edema has been getting worse with sitting in chair. Worse at end of day. Going to the bathroom easily.   Voice change At last visit, complained of difficulty swallowing and changes in speech. Referred to SLP and Gastroenterology. She did not attend either visit. She has noticed significant hoarseness in voice over last 24 months. No congestion or cough. Quit smoking in 2004. Voice is deeper than it was previously.   PERTINENT  PMH / PSH/Family/Social History : memory impairment (did not know where she was living today, cannot keep up with medications)   OBJECTIVE:   BP (!) 213/71   Pulse 62   Ht 5' 4 (1.626 m)   Wt 162 lb 12.8 oz (73.8 kg)   SpO2 96%   BMI 27.94 kg/m   Today's weight:  Last Weight  Most recent update: 11/27/2023 10:13 AM    Weight  73.8 kg (162 lb 12.8 oz)            Review of prior weights: Filed Weights   11/27/23 1012  Weight: 162 lb 12.8 oz (73.8 kg)    RRR Lungs clear 2+ bilateral eema   ASSESSMENT/PLAN:   Assessment & Plan Asymptomatic postmenopausal estrogen deficiency DEXA ordered, discussed  Memory changes Stable doing well They will bring all medications to next visit  Hypertension, unspecified type Not at goal BMP today BP difficult to manage given wide pulse pressure [name] will monitor at ALF and send  values (has them written down)  Asymptomatic today--monitor, discussed ED precautions   Recommended going to see Cardiology--they have number  Edema is stable and actually somewhat improved Has had recent echo--suspect due to venous stasis in additional to diastolic dysfunction Elevate legs at end of day May need to increase Lasix  pending labs  Discussed avoiding NSAIDS  Change in voice Referral to ENT  ? Polyps or other vocal cord dysfunction  No dysphagia or odynophagia  Urinary incontinence, unspecified type Has frequency and urgency Requires frequent use of adult incontinence pads to reduce skin breakdown Rx Zinc oxide for skin breakdown (none today but sometimes has intertrigo) Rx incontinence supplies--see telephone note     Suzann Daring, MD  Family Medicine Teaching Service  [facility]

## 2023-11-27 ENCOUNTER — Encounter: Payer: Self-pay | Admitting: Family Medicine

## 2023-11-27 ENCOUNTER — Ambulatory Visit (INDEPENDENT_AMBULATORY_CARE_PROVIDER_SITE_OTHER): Admitting: Family Medicine

## 2023-11-27 ENCOUNTER — Telehealth: Payer: Self-pay | Admitting: Family Medicine

## 2023-11-27 VITALS — BP 213/71 | HR 62 | Ht 64.0 in | Wt 162.8 lb

## 2023-11-27 DIAGNOSIS — R32 Unspecified urinary incontinence: Secondary | ICD-10-CM

## 2023-11-27 DIAGNOSIS — R499 Unspecified voice and resonance disorder: Secondary | ICD-10-CM | POA: Diagnosis not present

## 2023-11-27 DIAGNOSIS — R413 Other amnesia: Secondary | ICD-10-CM

## 2023-11-27 DIAGNOSIS — E118 Type 2 diabetes mellitus with unspecified complications: Secondary | ICD-10-CM

## 2023-11-27 DIAGNOSIS — I10 Essential (primary) hypertension: Secondary | ICD-10-CM | POA: Diagnosis not present

## 2023-11-27 DIAGNOSIS — Z780 Asymptomatic menopausal state: Secondary | ICD-10-CM

## 2023-11-27 MED ORDER — GERHARDT'S BUTT CREAM: TOPICAL_CREAM | CUTANEOUS | 3 refills | Status: AC

## 2023-11-27 NOTE — Addendum Note (Signed)
 Addended by: DELORES, Tausha Milhoan on: 11/27/2023 12:25 PM   Modules accepted: Orders

## 2023-11-27 NOTE — Patient Instructions (Signed)
 It was wonderful to see you today.  Please bring ALL of your medications with you to every visit.   Today we talked about:  Elevate your legs when you are sitting  Please bring your pill bottles with you next visit  I will check blood work today  I recommend seeing an Ear Nose and Throat doctor  I have referred you to ENT to further evaluate your concern. If you have not received a phone call about this appointment within 3-4 weeks, please call our office back at [phone]. Margit Dimes coordinates our referrals and can assist you in this.   I recommend a bone density test    You can call to schedule an appointment by calling [phone].   Directions [address] [place], [place] [address]  Please let me know if you have questions. I will send you a letter or call you with results.      Please follow up in 3 months   Thank you for choosing [facility].   Please call [phone] with any questions about today's appointment.  Please be sure to schedule follow up at the front  desk before you leave today.   Suzann Daring, MD  Family Medicine

## 2023-11-27 NOTE — Assessment & Plan Note (Signed)
 Stable doing well They will bring all medications to next visit

## 2023-11-27 NOTE — Telephone Encounter (Signed)
Patient has need for DME. I have ordered  incontinence supplies . I am routing note for [facility] RN Pool.   Westley Chandler, MD

## 2023-11-27 NOTE — Assessment & Plan Note (Addendum)
 Not at goal BMP today BP difficult to manage given wide pulse pressure Lauren Craig will monitor at ALF and send values (has them written down)  Asymptomatic today--monitor, discussed ED precautions   Recommended going to see Cardiology--they have number  Edema is stable and actually somewhat improved Has had recent echo--suspect due to venous stasis in additional to diastolic dysfunction Elevate legs at end of day May need to increase Lasix  pending labs  Discussed avoiding NSAIDS

## 2023-11-28 ENCOUNTER — Ambulatory Visit: Payer: Self-pay | Admitting: Family Medicine

## 2023-11-28 DIAGNOSIS — R600 Localized edema: Secondary | ICD-10-CM

## 2023-11-28 LAB — BASIC METABOLIC PANEL WITH GFR
BUN/Creatinine Ratio: 19 (ref 12–28)
BUN: 29 mg/dL — ABNORMAL HIGH (ref 8–27)
CO2: 18 mmol/L — ABNORMAL LOW (ref 20–29)
Calcium: 9.4 mg/dL (ref 8.7–10.3)
Chloride: 107 mmol/L — ABNORMAL HIGH (ref 96–106)
Creatinine, Ser: 1.56 mg/dL — ABNORMAL HIGH (ref 0.57–1.00)
Glucose: 120 mg/dL — ABNORMAL HIGH (ref 70–99)
Potassium: 5.8 mmol/L — ABNORMAL HIGH (ref 3.5–5.2)
Sodium: 140 mmol/L (ref 134–144)
eGFR: 34 mL/min/{1.73_m2} — ABNORMAL LOW (ref >59)

## 2023-11-28 MED ORDER — LOKELMA 5 G PO PACK: 10.0000 g | PACK | Freq: Every day | ORAL | 3 refills | Status: DC | PRN

## 2023-11-28 MED ORDER — FUROSEMIDE 20 MG PO TABS: 20.0000 mg | ORAL_TABLET | Freq: Two times a day (BID) | ORAL | 3 refills | Status: DC

## 2023-11-28 NOTE — Telephone Encounter (Signed)
 Called daughter with results. K of 5.8. Discussed needs repeat labs today. It appears she has not been taking lasix .  Rx lasix  20 mg BID per Nephrology. Also prescribed 2 days of lokelma . They will have a repeat at Urgent Care today. Discussed rationale for immediate repeat/treatment and reasons to go to ED.  Suzann Daring, MD  Family Medicine Teaching Service

## 2023-11-28 NOTE — Telephone Encounter (Signed)
 Community message sent to [employer] for processing.

## 2023-11-28 NOTE — Telephone Encounter (Deleted)
Called daughter

## 2023-11-29 ENCOUNTER — Other Ambulatory Visit: Payer: Self-pay | Admitting: Family Medicine

## 2023-11-29 ENCOUNTER — Other Ambulatory Visit

## 2023-11-29 DIAGNOSIS — E875 Hyperkalemia: Secondary | ICD-10-CM

## 2023-11-29 NOTE — Telephone Encounter (Signed)
Received confirmation from [employer].  

## 2023-11-30 ENCOUNTER — Other Ambulatory Visit: Payer: Self-pay

## 2023-11-30 ENCOUNTER — Ambulatory Visit: Payer: Self-pay | Admitting: Family Medicine

## 2023-11-30 ENCOUNTER — Inpatient Hospital Stay (HOSPITAL_COMMUNITY)
Admission: EM | Admit: 2023-11-30 | Discharge: 2023-12-05 | DRG: 641 | Disposition: A | Discharge status: Home / Self Care | Source: Ambulatory Visit | Attending: Family Medicine | Admitting: Family Medicine

## 2023-11-30 ENCOUNTER — Encounter (HOSPITAL_COMMUNITY): Payer: Self-pay

## 2023-11-30 ENCOUNTER — Emergency Department (HOSPITAL_COMMUNITY)

## 2023-11-30 ENCOUNTER — Telehealth: Payer: Self-pay | Admitting: Family Medicine

## 2023-11-30 DIAGNOSIS — Z79899 Other long term (current) drug therapy: Secondary | ICD-10-CM

## 2023-11-30 DIAGNOSIS — Z7982 Long term (current) use of aspirin: Secondary | ICD-10-CM

## 2023-11-30 DIAGNOSIS — R0609 Other forms of dyspnea: Secondary | ICD-10-CM

## 2023-11-30 DIAGNOSIS — E875 Hyperkalemia: Principal | ICD-10-CM | POA: Diagnosis present

## 2023-11-30 DIAGNOSIS — M7989 Other specified soft tissue disorders: Secondary | ICD-10-CM

## 2023-11-30 DIAGNOSIS — N189 Chronic kidney disease, unspecified: Secondary | ICD-10-CM | POA: Diagnosis not present

## 2023-11-30 DIAGNOSIS — E1151 Type 2 diabetes mellitus with diabetic peripheral angiopathy without gangrene: Secondary | ICD-10-CM | POA: Diagnosis present

## 2023-11-30 DIAGNOSIS — I130 Hypertensive heart and chronic kidney disease with heart failure and stage 1 through stage 4 chronic kidney disease, or unspecified chronic kidney disease: Secondary | ICD-10-CM | POA: Diagnosis present

## 2023-11-30 DIAGNOSIS — N179 Acute kidney failure, unspecified: Secondary | ICD-10-CM | POA: Diagnosis not present

## 2023-11-30 DIAGNOSIS — R0602 Shortness of breath: Secondary | ICD-10-CM | POA: Diagnosis not present

## 2023-11-30 DIAGNOSIS — Z91148 Patient's other noncompliance with medication regimen for other reason: Secondary | ICD-10-CM

## 2023-11-30 DIAGNOSIS — I5030 Unspecified diastolic (congestive) heart failure: Secondary | ICD-10-CM | POA: Diagnosis present

## 2023-11-30 DIAGNOSIS — E538 Deficiency of other specified B group vitamins: Secondary | ICD-10-CM | POA: Diagnosis present

## 2023-11-30 DIAGNOSIS — E785 Hyperlipidemia, unspecified: Secondary | ICD-10-CM | POA: Diagnosis present

## 2023-11-30 DIAGNOSIS — R0989 Other specified symptoms and signs involving the circulatory and respiratory systems: Secondary | ICD-10-CM | POA: Diagnosis not present

## 2023-11-30 DIAGNOSIS — N184 Chronic kidney disease, stage 4 (severe): Secondary | ICD-10-CM | POA: Diagnosis not present

## 2023-11-30 DIAGNOSIS — I5032 Chronic diastolic (congestive) heart failure: Secondary | ICD-10-CM | POA: Diagnosis present

## 2023-11-30 DIAGNOSIS — Z89422 Acquired absence of other left toe(s): Secondary | ICD-10-CM

## 2023-11-30 DIAGNOSIS — Z87891 Personal history of nicotine dependence: Secondary | ICD-10-CM

## 2023-11-30 DIAGNOSIS — R001 Bradycardia, unspecified: Secondary | ICD-10-CM | POA: Diagnosis present

## 2023-11-30 DIAGNOSIS — Z789 Other specified health status: Secondary | ICD-10-CM

## 2023-11-30 DIAGNOSIS — E876 Hypokalemia: Secondary | ICD-10-CM | POA: Diagnosis present

## 2023-11-30 DIAGNOSIS — E1122 Type 2 diabetes mellitus with diabetic chronic kidney disease: Secondary | ICD-10-CM | POA: Diagnosis present

## 2023-11-30 DIAGNOSIS — D649 Anemia, unspecified: Secondary | ICD-10-CM | POA: Insufficient documentation

## 2023-11-30 DIAGNOSIS — Z880 Allergy status to penicillin: Secondary | ICD-10-CM

## 2023-11-30 DIAGNOSIS — D631 Anemia in chronic kidney disease: Secondary | ICD-10-CM | POA: Diagnosis present

## 2023-11-30 DIAGNOSIS — Z888 Allergy status to other drugs, medicaments and biological substances status: Secondary | ICD-10-CM

## 2023-11-30 DIAGNOSIS — F32A Depression, unspecified: Secondary | ICD-10-CM | POA: Diagnosis present

## 2023-11-30 DIAGNOSIS — I10 Essential (primary) hypertension: Secondary | ICD-10-CM | POA: Insufficient documentation

## 2023-11-30 DIAGNOSIS — Z95818 Presence of other cardiac implants and grafts: Secondary | ICD-10-CM

## 2023-11-30 DIAGNOSIS — Z8673 Personal history of transient ischemic attack (TIA), and cerebral infarction without residual deficits: Secondary | ICD-10-CM

## 2023-11-30 LAB — CBC WITH DIFFERENTIAL/PLATELET
Abs Immature Granulocytes: 0.02 10*3/uL (ref 0.00–0.07)
Basophils Absolute: 0.1 10*3/uL (ref 0.0–0.1)
Basophils Relative: 1 %
Eosinophils Absolute: 0.2 10*3/uL (ref 0.0–0.5)
Eosinophils Relative: 3 %
HCT: 31.6 % — ABNORMAL LOW (ref 36.0–46.0)
Hemoglobin: 9.8 g/dL — ABNORMAL LOW (ref 12.0–15.0)
Immature Granulocytes: 0 %
Lymphocytes Relative: 22 %
Lymphs Abs: 1.5 10*3/uL (ref 0.7–4.0)
MCH: 26.2 pg (ref 26.0–34.0)
MCHC: 31 g/dL (ref 30.0–36.0)
MCV: 84.5 fL (ref 80.0–100.0)
Monocytes Absolute: 0.6 10*3/uL (ref 0.1–1.0)
Monocytes Relative: 9 %
Neutro Abs: 4.4 10*3/uL (ref 1.7–7.7)
Neutrophils Relative %: 65 %
Platelets: 223 10*3/uL (ref 150–400)
RBC: 3.74 MIL/uL — ABNORMAL LOW (ref 3.87–5.11)
RDW: 14.7 % (ref 11.5–15.5)
WBC: 6.7 10*3/uL (ref 4.0–10.5)
nRBC: 0 % (ref 0.0–0.2)

## 2023-11-30 LAB — BRAIN NATRIURETIC PEPTIDE: B Natriuretic Peptide: 353.7 pg/mL — ABNORMAL HIGH (ref 0.0–100.0)

## 2023-11-30 LAB — BASIC METABOLIC PANEL WITH GFR
Anion gap: 7 (ref 5–15)
BUN/Creatinine Ratio: 19 (ref 12–28)
BUN: 39 mg/dL — ABNORMAL HIGH (ref 8–27)
BUN: 44 mg/dL — ABNORMAL HIGH (ref 8–23)
CO2: 17 mmol/L — ABNORMAL LOW (ref 20–29)
CO2: 20 mmol/L — ABNORMAL LOW (ref 22–32)
Calcium: 8.6 mg/dL — ABNORMAL LOW (ref 8.7–10.3)
Calcium: 8.4 mg/dL — ABNORMAL LOW (ref 8.9–10.3)
Chloride: 107 mmol/L — ABNORMAL HIGH (ref 96–106)
Chloride: 109 mmol/L (ref 98–111)
Creatinine, Ser: 2.02 mg/dL — ABNORMAL HIGH (ref 0.57–1.00)
Creatinine, Ser: 2.1 mg/dL — ABNORMAL HIGH (ref 0.44–1.00)
GFR, Estimated: 24 mL/min — ABNORMAL LOW (ref >60)
Glucose, Bld: 106 mg/dL — ABNORMAL HIGH (ref 70–99)
Glucose: 160 mg/dL — ABNORMAL HIGH (ref 70–99)
Potassium: 6.4 mmol/L — ABNORMAL HIGH (ref 3.5–5.2)
Potassium: 5.7 mmol/L — ABNORMAL HIGH (ref 3.5–5.1)
Sodium: 139 mmol/L (ref 134–144)
Sodium: 136 mmol/L (ref 135–145)
eGFR: 25 mL/min/{1.73_m2} — ABNORMAL LOW (ref >59)

## 2023-11-30 LAB — URINALYSIS, ROUTINE W REFLEX MICROSCOPIC
Bacteria, UA: NONE SEEN
Bilirubin Urine: NEGATIVE
Glucose, UA: NEGATIVE mg/dL
Hgb urine dipstick: NEGATIVE
Ketones, ur: NEGATIVE mg/dL
Leukocytes,Ua: NEGATIVE
Nitrite: NEGATIVE
Protein, ur: 100 mg/dL — AB
Specific Gravity, Urine: 1.006 (ref 1.005–1.030)
pH: 7 (ref 5.0–8.0)

## 2023-11-30 LAB — TROPONIN I (HIGH SENSITIVITY)
Troponin I (High Sensitivity): 14 ng/L (ref <18)
Troponin I (High Sensitivity): 15 ng/L (ref <18)

## 2023-11-30 MED ORDER — LORATADINE 10 MG PO TABS
10.0000 mg | ORAL_TABLET | Freq: Two times a day (BID) | ORAL | Status: DC
  Administered 2023-11-30 – 2023-12-05 (×10): 10 mg via ORAL
  Filled 2023-11-30 (×10): qty 1

## 2023-11-30 MED ORDER — AMLODIPINE BESYLATE 5 MG PO TABS
5.0000 mg | ORAL_TABLET | Freq: Every day | ORAL | Status: DC
  Administered 2023-11-30: 5 mg via ORAL
  Filled 2023-11-30: qty 1

## 2023-11-30 MED ORDER — APOAEQUORIN 10 MG PO CAPS: ORAL_CAPSULE | Freq: Every day | ORAL | Status: DC

## 2023-11-30 MED ORDER — SODIUM ZIRCONIUM CYCLOSILICATE 5 G PO PACK
5.0000 g | PACK | Freq: Once | ORAL | Status: DC
  Filled 2023-11-30 (×4): qty 1

## 2023-11-30 MED ORDER — FUROSEMIDE 20 MG PO TABS
20.0000 mg | ORAL_TABLET | Freq: Every day | ORAL | Status: DC
  Administered 2023-12-01: 20 mg via ORAL
  Filled 2023-11-30: qty 1

## 2023-11-30 MED ORDER — EZETIMIBE 10 MG PO TABS
10.0000 mg | ORAL_TABLET | Freq: Every day | ORAL | Status: DC
  Administered 2023-12-01 – 2023-12-05 (×5): 10 mg via ORAL
  Filled 2023-11-30 (×5): qty 1

## 2023-11-30 MED ORDER — ROSUVASTATIN CALCIUM 5 MG PO TABS
10.0000 mg | ORAL_TABLET | Freq: Every day | ORAL | Status: DC
  Administered 2023-12-01 – 2023-12-05 (×5): 10 mg via ORAL
  Filled 2023-11-30 (×5): qty 2

## 2023-11-30 MED ORDER — CARVEDILOL 6.25 MG PO TABS
6.2500 mg | ORAL_TABLET | Freq: Two times a day (BID) | ORAL | Status: DC
  Administered 2023-11-30: 6.25 mg via ORAL
  Filled 2023-11-30: qty 2

## 2023-11-30 MED ORDER — ALBUTEROL SULFATE (2.5 MG/3ML) 0.083% IN NEBU
2.5000 mg | INHALATION_SOLUTION | Freq: Four times a day (QID) | RESPIRATORY_TRACT | Status: DC | PRN
  Administered 2023-12-02: 2.5 mg via RESPIRATORY_TRACT
  Filled 2023-11-30: qty 3

## 2023-11-30 MED ORDER — FLUTICASONE PROPIONATE 50 MCG/ACT NA SUSP: 1.0000 | Freq: Every day | NASAL | Status: DC | PRN

## 2023-11-30 MED ORDER — ENOXAPARIN SODIUM 30 MG/0.3ML IJ SOSY
30.0000 mg | PREFILLED_SYRINGE | INTRAMUSCULAR | Status: DC
  Administered 2023-12-01 – 2023-12-05 (×5): 30 mg via SUBCUTANEOUS
  Filled 2023-11-30 (×5): qty 0.3

## 2023-11-30 MED ORDER — ASPIRIN 81 MG PO TBEC
81.0000 mg | DELAYED_RELEASE_TABLET | Freq: Every day | ORAL | Status: DC
  Administered 2023-12-01 – 2023-12-05 (×5): 81 mg via ORAL
  Filled 2023-11-30 (×5): qty 1

## 2023-11-30 MED ORDER — ALBUTEROL SULFATE (2.5 MG/3ML) 0.083% IN NEBU
2.5000 mg | INHALATION_SOLUTION | Freq: Once | RESPIRATORY_TRACT | Status: AC
  Administered 2023-11-30: 2.5 mg via RESPIRATORY_TRACT
  Filled 2023-11-30: qty 3

## 2023-11-30 MED ORDER — CALCIUM GLUCONATE-NACL 1-0.675 GM/50ML-% IV SOLN
1.0000 g | Freq: Once | INTRAVENOUS | Status: AC
  Administered 2023-11-30: 1000 mg via INTRAVENOUS
  Filled 2023-11-30: qty 50

## 2023-11-30 MED ORDER — CLONIDINE HCL 0.1 MG PO TABS
0.1000 mg | ORAL_TABLET | Freq: Two times a day (BID) | ORAL | Status: DC
  Administered 2023-11-30 – 2023-12-05 (×10): 0.1 mg via ORAL
  Filled 2023-11-30 (×10): qty 1

## 2023-11-30 MED ORDER — ESCITALOPRAM OXALATE 20 MG PO TABS
20.0000 mg | ORAL_TABLET | Freq: Every day | ORAL | Status: DC
  Administered 2023-12-01 – 2023-12-05 (×5): 20 mg via ORAL
  Filled 2023-11-30 (×5): qty 1

## 2023-11-30 MED ORDER — SODIUM BICARBONATE 8.4 % IV SOLN
50.0000 meq | Freq: Once | INTRAVENOUS | Status: AC
  Administered 2023-11-30: 50 meq via INTRAVENOUS
  Filled 2023-11-30: qty 100
  Filled 2023-11-30: qty 50

## 2023-11-30 MED ORDER — ACETAMINOPHEN 325 MG PO TABS
650.0000 mg | ORAL_TABLET | Freq: Four times a day (QID) | ORAL | Status: DC | PRN
  Administered 2023-12-03 – 2023-12-04 (×2): 650 mg via ORAL
  Filled 2023-11-30 (×2): qty 2

## 2023-11-30 MED ORDER — FAMOTIDINE 20 MG PO TABS
10.0000 mg | ORAL_TABLET | Freq: Every day | ORAL | Status: DC
  Administered 2023-12-01 – 2023-12-05 (×5): 10 mg via ORAL
  Filled 2023-11-30 (×5): qty 1

## 2023-11-30 NOTE — H&P (Addendum)
 Hospital Admission History and Physical Service Pager: [phone]  Patient name: Lauren Craig Medical record number: [id number] Date of Birth: 01-May-1946 Age: 78 y.o. Gender: female  Primary Care Provider: Delores Suzann HERO, MD Consultants: None Code Status: FULL Preferred Emergency Contact:  Contact Information     Name Relation Home Work Mobile   [place] Daughter   [phone]   [place], [place] [phone]      Other Contacts   None on File    Chief Complaint: Hyperkalemia  Differential and Medical Decision Making Lauren Craig is a 78 y.o. female instructed by her PCP to go to the ED due to hyperkalemia 6.4. Differential for this patient's presentation of this includes excess K supplementation (possible given CKD and patient's hx of supplemental K at home), CKD in isolation (possible, depending on whether patient actively taking K supplements), medication side effect (possible with beta blocker like carvedilol , less likely as she has been stable on this medication), hemolysis (she is anemic, though will collect bilirubin and retic count), and rhabdomyolysis/burn (unlikely as no history of injury or excess physical exertion).  Assessment & Plan Hyperkalemia Patient stable in the ED, K has improved from 6.4 to 5.7 over 5 hours s/p albuterol . Patient's hyperkalemia most likely 2/2 CKD given that she has had similar prior episodes of hyperkalemia and no evidence of hemolytic process or event that could precipitate significant cell lysis otherwise.  -EKG, monitor AM BMP -Lokelma  5g once for now, redose as needed -Will obtain reticulocytes to assess hemolytic process CKD (chronic kidney disease) stage 4, GFR 15-29 ml/min (HCC) GFR 24, Cr 1.56 on 6/30 and 2.02 7/2; however, it appears her baseline is mostly around 2. -Encourage PO, consider light IV fluids as needed if worsening (HFpEF) heart failure with preserved ejection fraction  [facility]) Patient with prior diagnosis of HFpEF w/ grade II diastolic dysfunction. Does not appear fluid overloaded on exam, denies SoB.  She does take furosemide  20 mg twice daily for bilateral lower extremity edema.  BNP 353 consistent with previous BNP 12/24 and down from BNP of 575 2/24.  - Continue to monitor clinically - Furosemide  20 mg twice daily Hypertension Uncontrolled.  Appears to have history of difficult to control SBP.  She is on amlodipine , clonidine , carvedilol  at home. -Continue amlodipine  5 daily, clonidine  0.1 mg daily -Given bradycardia, will decrease dose of carvedilol  to 6.125 mg daily -Consider long-term hydralazine  versus labetalol  for blood pressure control given her renal disease Normocytic anemia In setting of anemia of chronic disease from CKD most likely. No endorsed bleeding. -AM CBC -Iron studies -Retic count to evaluate hemolysis given elevated K, will check bilirubin  Chronic and Stable Conditions: HLD, PVD: Ezetimibe  10 mg tablet, Rosuvastatin  10 mg tablet T2DM: Last A1c 6.6, no chronic medications  FEN/GI: Diet renal VTE Prophylaxis: Lovenox , watch CBC  Disposition: Med/surg  History of Present Illness:  Lauren Craig is a 78 y.o. female presenting with hyperkalemia.  Presents at the instruction of her PCP after labs found K 6.4. She reports she had been feeling fatigued for 1-2 weeks before the labs were collected, but she does not endorse other symptoms.   She recounts that she has been living in an ALF, and the nurse there noticed that she was not acting herself and needed to be evaluated.  She denies chest pain, SOB, palpitations, chest pain with us  tonight. She has not been taking any potassium pills at home recently.  In the ED, patient reports recent fatigue,  SoB, and feelings of numbness in her legs.  FMTS consulted for admission to monitor and manage hyperkalemia to safe range.   Review Of Systems: Per HPI.  Pertinent Past Medical  History: HLD T2DM Essential hypertension PVD CKD Left MCA CVA Remainder reviewed in history tab.   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   Pertinent Social History: Tobacco use: Former, 1 pack/day, quit 2004 Alcohol use: None Other Substance use: None Lives at an ALF.  She is a Runner, broadcasting/film/video and retired Chief Technology Officer  Pertinent Family History: Mother deceased, diabetes mellitus type 2, cerebrovascular disease Father deceased, hypertension Brother, alive, heart problems, diabetes mellitus type 2, bipolar disorder Sister, alive, healthy Remainder reviewed in history tab.   Important Outpatient Medications: Furosemide  20 mg tablet twice daily Flonase  50 mcg/ACT nasal spray Fexofenadine  60 mg tablet Amlodipine  5 mg tablet  Aspirin  81 mg tablet  Carvedilol  12.5 mg tablet Clonidine  0.1 mg tablet Escitalopram  20 mg tablet Ezetimibe  10 mg tablet Famotidine  10 mg tablet Rosuvastatin  10 mg tablet Remainder reviewed in medication history.   Objective: BP (!) 215/66 (BP Location: Left Arm)   Pulse 60   Temp (!) 97.5 F (36.4 C)   Resp 16   Ht 5' 4 (1.626 m)   Wt 73.8 kg   SpO2 96%   BMI 27.93 kg/m  Exam: General: Alert, in no acute distress.  Cardiovascular: RRR, Soft systolic murmur Respiratory: CTA b/l, good air movement Gastrointestinal: Soft, non tender abdomen.  Neuro: No focal deficits, AOx4 Psych: Appropriate mood and affect  Labs:  CBC BMET  Recent Labs  Lab 11/30/23 1601  WBC 6.7  HGB 9.8*  HCT 31.6*  PLT 223   Recent Labs  Lab 11/30/23 1601  NA 136  K 5.7*  CL 109  CO2 20*  BUN 44*  CREATININE 2.10*  GLUCOSE 106*  CALCIUM  8.4*    Pertinent additional labs BNP 353, Trop 14.   EKG: My own interpretation (not copied from electronic read): pending   Imaging Studies Performed:  CXR:  Imaging Study (ie. Chest  x-ray) Impression from Radiologist: No active disease.   My Interpretation: No acute cardiopulmonary process, low lung volumes. Cardiac silhouette consistent with CXR from 7/22.   Manon Jester, DO 11/30/2023, 10:33 PM PGY-1, [facility]  FPTS Intern pager: [phone], text pages welcome Secure chat group [facility] Teaching Service   I agree with the assessment and plan as documented above.  Stuart Redo, MD PGY-3, [facility]

## 2023-11-30 NOTE — Assessment & Plan Note (Addendum)
 GFR 24, Cr 1.56 on 6/30 and 2.02 7/2; however, it appears her baseline is mostly around 2. -Encourage PO, consider light IV fluids as needed if worsening

## 2023-11-30 NOTE — Assessment & Plan Note (Addendum)
 Patient stable in the ED, K has improved from 6.4 to 5.7 over 5 hours s/p albuterol . Patient's hyperkalemia most likely 2/2 CKD given that she has had similar prior episodes of hyperkalemia and no evidence of hemolytic process or event that could precipitate significant cell lysis otherwise.  -EKG, monitor AM BMP -Lokelma  5g once for now, redose as needed -Will obtain reticulocytes to assess hemolytic process

## 2023-11-30 NOTE — ED Notes (Signed)
 Message sent to pharmacy regarding missing dose of Lokelma 

## 2023-11-30 NOTE — Telephone Encounter (Signed)
 Reviewed results. Creatinine and K both increased on repeat measurement. Advised ED evaluation.  Daughter will bring to [facility] ASAP.  Suzann Daring, MD  Family Medicine Teaching Service

## 2023-11-30 NOTE — ED Triage Notes (Signed)
 Pt arrives via POV with her daughter. PT reports she was told by her pcp to come to the ED due to her potassium being elevated (6.4). She had lab work done yesterday. Pt currently reports she has been feeling fatigued. Denies any other complaints at this time.

## 2023-11-30 NOTE — ED Notes (Signed)
 67M charge advised pt heading up

## 2023-11-30 NOTE — ED Provider Notes (Signed)
 [place] EMERGENCY DEPARTMENT AT [facility] Provider Note   CSN: [id number] Arrival date & time: 11/30/23  1524     Patient presents with: Abnormal Lab (Potassium 6.4)   Lauren Craig is a 78 y.o. female.   78 year old female presents for evaluation of abnormal lab work.  She states her primary care doctor called today and told her her potassium was elevated.  She states this is never happened before but she is on potassium supplements.  She states she has numbness of increased feeling in her legs and more fatigue and shortness of breath lately.  She denies any decrease in urinary output or any previous kidney problems.  Denies any other symptoms or concerns at this time.   Abnormal Lab      Prior to Admission medications   Medication Sig Start Date End Date Taking? Authorizing Provider  acetaminophen  (TYLENOL ) 325 MG tablet Take 2 tablets (650 mg total) by mouth every 6 (six) hours as needed for headache. 11/01/20  Yes Malvina Ellen, MD  albuterol  (VENTOLIN  HFA) 108 (90 Base) MCG/ACT inhaler Inhale 2 puffs into the lungs every 6 (six) hours as needed for wheezing or shortness of breath. 12/16/22  Yes Delores Suzann HERO, MD  amLODipine  (NORVASC ) 5 MG tablet Take 1 tablet (5 mg total) by mouth at bedtime. 08/01/23  Yes Delores Suzann HERO, MD  Apoaequorin (PREVAGEN PO) Take 1 tablet by mouth daily.   Yes [provider]  aspirin  EC 81 MG tablet Take 1 tablet (81 mg total) by mouth daily. Swallow whole. 08/01/23  Yes Delores Suzann HERO, MD  carvedilol  (COREG ) 12.5 MG tablet Take 1 tablet (12.5 mg total) by mouth 2 (two) times daily with a meal. 08/01/23  Yes Delores Suzann HERO, MD  cloNIDine  (CATAPRES ) 0.1 MG tablet Take 1 tablet (0.1 mg total) by mouth 2 (two) times daily. 08/01/23  Yes Delores Suzann HERO, MD  escitalopram  (LEXAPRO ) 20 MG tablet Take 1 tablet (20 mg total) by mouth daily. 08/01/23  Yes Delores Suzann HERO, MD  ezetimibe  (ZETIA ) 10 MG tablet Take 1 tablet (10 mg total) by mouth  daily. 08/01/23  Yes Delores Suzann HERO, MD  famotidine  (PEPCID ) 10 MG tablet Take 1 tablet (10 mg total) by mouth daily. 08/01/23  Yes Delores Suzann HERO, MD  fexofenadine  (ALLEGRA  ALLERGY) 60 MG tablet Take 1 tablet (60 mg total) by mouth 2 (two) times daily. 08/31/23  Yes Delores Suzann HERO, MD  fluticasone  (FLONASE ) 50 MCG/ACT nasal spray SPRAY 2 SPRAYS INTO EACH NOSTRIL EVERY DAY Patient taking differently: Place 1 spray into both nostrils daily as needed for allergies. 10/19/23  Yes Delores Suzann HERO, MD  furosemide  (LASIX ) 20 MG tablet Take 1 tablet (20 mg total) by mouth in the morning and at bedtime. Patient taking differently: Take 20 mg by mouth daily. 11/28/23  Yes Delores Suzann HERO, MD  hydrocortisone  2.5 % ointment Apply topically 2 (two) times daily. On red areas on legs 11/09/22  Yes Chambliss, Layman CROME, MD  Nystatin  (GERHARDT'S BUTT CREAM) CREA Use twice daily in skin folds Patient taking differently: Apply 1 Application topically daily as needed for irritation. 11/27/23  Yes Delores Suzann HERO, MD  nystatin  cream (MYCOSTATIN ) Apply 1 Application topically 4 (four) times daily. Apply to rash 4 times daily as needed for yeast 08/31/23  Yes Delores Suzann HERO, MD  ondansetron  (ZOFRAN -ODT) 4 MG disintegrating tablet Take 1 tablet (4 mg total) by mouth every 8 (eight) hours as needed for nausea or vomiting.  12/16/22  Yes Delores Suzann HERO, MD  rosuvastatin  (CRESTOR ) 10 MG tablet Take 1 tablet (10 mg total) by mouth daily. 08/01/23  Yes Delores Suzann HERO, MD  sodium zirconium cyclosilicate  (LOKELMA ) 5 g packet Take 10 g by mouth daily as needed. Patient taking differently: Take 10 g by mouth daily. 11/28/23  Yes Delores Suzann HERO, MD  triamcinolone  (KENALOG ) 0.025 % ointment Apply 1 Application topically 2 (two) times daily. Apply to right ankle rash Patient taking differently: Apply 1 Application topically daily as needed (rash). 05/10/23  Yes Delores Suzann HERO, MD    Allergies: Penicillins, Insulin  lispro, Insulin  lispro prot  & lispro, Semaglutide, and Metformin    Review of Systems  Constitutional:  Positive for fatigue. Negative for chills and fever.  HENT:  Negative for ear pain and sore throat.   Eyes:  Negative for pain and visual disturbance.  Respiratory:  Positive for shortness of breath. Negative for cough.   Cardiovascular:  Positive for leg swelling. Negative for chest pain and palpitations.  Gastrointestinal:  Negative for abdominal pain and vomiting.  Genitourinary:  Negative for dysuria and hematuria.  Musculoskeletal:  Negative for arthralgias and back pain.  Skin:  Negative for color change and rash.  Neurological:  Negative for seizures and syncope.  All other systems reviewed and are negative.   Updated Vital Signs BP (!) 215/66 (BP Location: Left Arm)   Pulse 60   Temp (!) 97.5 F (36.4 C)   Resp 16   Ht 5' 4 (1.626 m)   Wt 73.8 kg   SpO2 96%   BMI 27.93 kg/m   Physical Exam Vitals and nursing note reviewed.  Constitutional:      General: She is not in acute distress.    Appearance: Normal appearance. She is well-developed. She is not ill-appearing.  HENT:     Head: Normocephalic and atraumatic.  Eyes:     Conjunctiva/sclera: Conjunctivae normal.  Cardiovascular:     Rate and Rhythm: Normal rate and regular rhythm.     Heart sounds: Normal heart sounds. No murmur heard. Pulmonary:     Effort: Pulmonary effort is normal. No respiratory distress.     Breath sounds: Normal breath sounds. No stridor. No wheezing or rhonchi.  Abdominal:     Palpations: Abdomen is soft.     Tenderness: There is no abdominal tenderness.  Musculoskeletal:        General: No swelling.     Cervical back: Neck supple.     Right lower leg: Edema present.     Left lower leg: Edema present.  Skin:    General: Skin is warm and dry.     Capillary Refill: Capillary refill takes less than 2 seconds.  Neurological:     Mental Status: She is alert.  Psychiatric:        Mood and Affect: Mood normal.      (all labs ordered are listed, but only abnormal results are displayed) Labs Reviewed  BASIC METABOLIC PANEL WITH GFR - Abnormal; Notable for the following components:      Result Value   Potassium 5.7 (*)    CO2 20 (*)    Glucose, Bld 106 (*)    BUN 44 (*)    Creatinine, Ser 2.10 (*)    Calcium  8.4 (*)    GFR, Estimated 24 (*)    All other components within normal limits  CBC WITH DIFFERENTIAL/PLATELET - Abnormal; Notable for the following components:   RBC 3.74 (*)  Hemoglobin 9.8 (*)    HCT 31.6 (*)    All other components within normal limits  BRAIN NATRIURETIC PEPTIDE - Abnormal; Notable for the following components:   B Natriuretic Peptide 353.7 (*)    All other components within normal limits  URINALYSIS, ROUTINE W REFLEX MICROSCOPIC  TROPONIN I (HIGH SENSITIVITY)  TROPONIN I (HIGH SENSITIVITY)    EKG: None  Radiology: DG Chest 1 View Result Date: 11/30/2023 CLINICAL DATA:  Shortness of breath. EXAM: CHEST  1 VIEW COMPARISON:  12/17/2020. FINDINGS: Low lung volume. Bilateral lung fields are clear. Bilateral costophrenic angles are clear. Stable cardio-mediastinal silhouette. No acute osseous abnormalities. The soft tissues are within normal limits. IMPRESSION: No active disease. Electronically Signed   By: Ree Molt M.D.   On: 11/30/2023 16:21     Procedures   Medications Ordered in the ED  sodium zirconium cyclosilicate  (LOKELMA ) packet 5 g (has no administration in time range)  calcium  gluconate 1 g/ 50 mL sodium chloride  IVPB (1,000 mg Intravenous New Bag/Given 11/30/23 1819)  sodium bicarbonate  injection 50 mEq (50 mEq Intravenous Given 11/30/23 1812)  albuterol  (PROVENTIL ) (2.5 MG/3ML) 0.083% nebulizer solution 2.5 mg (2.5 mg Nebulization Given 11/30/23 1815)                                    Medical Decision Making Medical Decision Making Nursing notes are reviewed. Differential diagnosis for this patient would include but not limited to:  Electrolyte abnormality, acute renal failure, lab error, CHF, other  Cardiac monitor interpretation: Sinus bradycardia, no ectopy  Emergency Department Course:  Vital signs and pulse oximetry are reviewed, evaluated by myself and found to be within normal limits prior to final disposition. Findings of laboratory testing and medical imaging are discussed with patient and family that is available. Patient agrees with the medical care plan as follows:  Patient was sent here for hyperkalemia.  Lab do show that she has elevated potassium.  Was given a dose of calcium  gluconate bicarb Lokelma  and albuterol  for management of hyperkalemia. Her creatinine is baseline.  She has been fatigued as well and is somewhat bradycardic today even compared to her usual, which may be related to her hypokalemia she is also had significant leg swelling and dyspnea on exertion, which could be some element of fluid overload or CHF.  Discussed patient's case with hospitalist and patient will be mated for further workup and management.  Patient and family at bedside are agreeable with the plan.  I discussed all the results with patient and her daughter at bedside.  Critical care time:  The high probability of sudden, clincially significant deterioration of the pt's condition required the highest level of my preparedness to intervene urgently.  39 minutes of critical care time spent managing the pt's case which includes urgent interventions required to prevent body system deterioration or permanent bodily impairment, direct contact evaluating and re-evaluating the pt, treating sxs, reviewing labs and studies, speaking with pt's family, and consults. Time also includes documentation of the above. This time excludes time spent on any separately reported billable procedures and teaching.   Problems Addressed: Dyspnea on exertion: acute illness or injury Hyperkalemia: acute illness or injury that poses a threat to life or  bodily functions Leg swelling: undiagnosed new problem with uncertain prognosis  Amount and/or Complexity of Data Reviewed External Data Reviewed: labs and notes.    Details: Previous labs reviewed and a  few days ago patient had a elevated potassium of 5.7, yesterday was 6.4.  Labs: ordered. Decision-making details documented in ED Course.    Details: Patient's lab workup was ordered and reviewed by me and she is hyperkalemic.  She does have baseline CKD and creatinine is stable.  She has a slight elevation in her BNP but labs otherwise fairly unremarkable Radiology: ordered and independent interpretation performed. Decision-making details documented in ED Course.    Details: Imaging was ordered and interpreted by me independently of radiology Chest x-ray shows no evidence of vascular congestion or pulmonary edema ECG/medicine tests: ordered and independent interpretation performed. Decision-making details documented in ED Course.    Details: Interpreted by me in the absence of cardiology and shows sinus bradycardia, no peaked T waves, normal intervals  Risk Prescription drug management. Drug therapy requiring intensive monitoring for toxicity. Decision regarding hospitalization.     Final diagnoses:  Hyperkalemia  Leg swelling  Dyspnea on exertion    ED Discharge Orders     None          Gennaro Duwaine CROME, DO 11/30/23 2121

## 2023-12-01 ENCOUNTER — Encounter (HOSPITAL_COMMUNITY): Payer: Self-pay

## 2023-12-01 DIAGNOSIS — E875 Hyperkalemia: Secondary | ICD-10-CM | POA: Diagnosis not present

## 2023-12-01 DIAGNOSIS — E1122 Type 2 diabetes mellitus with diabetic chronic kidney disease: Secondary | ICD-10-CM | POA: Diagnosis not present

## 2023-12-01 DIAGNOSIS — N184 Chronic kidney disease, stage 4 (severe): Secondary | ICD-10-CM | POA: Diagnosis not present

## 2023-12-01 DIAGNOSIS — E1151 Type 2 diabetes mellitus with diabetic peripheral angiopathy without gangrene: Secondary | ICD-10-CM | POA: Diagnosis not present

## 2023-12-01 DIAGNOSIS — I5030 Unspecified diastolic (congestive) heart failure: Secondary | ICD-10-CM | POA: Diagnosis present

## 2023-12-01 DIAGNOSIS — F32A Depression, unspecified: Secondary | ICD-10-CM | POA: Diagnosis not present

## 2023-12-01 DIAGNOSIS — N179 Acute kidney failure, unspecified: Secondary | ICD-10-CM | POA: Diagnosis not present

## 2023-12-01 DIAGNOSIS — R001 Bradycardia, unspecified: Secondary | ICD-10-CM | POA: Diagnosis not present

## 2023-12-01 DIAGNOSIS — M7989 Other specified soft tissue disorders: Secondary | ICD-10-CM | POA: Diagnosis not present

## 2023-12-01 DIAGNOSIS — D631 Anemia in chronic kidney disease: Secondary | ICD-10-CM | POA: Diagnosis not present

## 2023-12-01 DIAGNOSIS — Z79899 Other long term (current) drug therapy: Secondary | ICD-10-CM | POA: Diagnosis not present

## 2023-12-01 DIAGNOSIS — I10 Essential (primary) hypertension: Secondary | ICD-10-CM | POA: Insufficient documentation

## 2023-12-01 DIAGNOSIS — I130 Hypertensive heart and chronic kidney disease with heart failure and stage 1 through stage 4 chronic kidney disease, or unspecified chronic kidney disease: Secondary | ICD-10-CM | POA: Diagnosis not present

## 2023-12-01 DIAGNOSIS — E785 Hyperlipidemia, unspecified: Secondary | ICD-10-CM | POA: Diagnosis not present

## 2023-12-01 DIAGNOSIS — D649 Anemia, unspecified: Secondary | ICD-10-CM | POA: Insufficient documentation

## 2023-12-01 DIAGNOSIS — E876 Hypokalemia: Secondary | ICD-10-CM | POA: Diagnosis not present

## 2023-12-01 DIAGNOSIS — Z789 Other specified health status: Secondary | ICD-10-CM

## 2023-12-01 DIAGNOSIS — I5032 Chronic diastolic (congestive) heart failure: Secondary | ICD-10-CM | POA: Diagnosis not present

## 2023-12-01 LAB — CBC
HCT: 31.3 % — ABNORMAL LOW (ref 36.0–46.0)
Hemoglobin: 9.9 g/dL — ABNORMAL LOW (ref 12.0–15.0)
MCH: 26.5 pg (ref 26.0–34.0)
MCHC: 31.6 g/dL (ref 30.0–36.0)
MCV: 83.7 fL (ref 80.0–100.0)
Platelets: 209 K/uL (ref 150–400)
RBC: 3.74 MIL/uL — ABNORMAL LOW (ref 3.87–5.11)
RDW: 14.6 % (ref 11.5–15.5)
WBC: 6.8 K/uL (ref 4.0–10.5)
nRBC: 0 % (ref 0.0–0.2)

## 2023-12-01 LAB — FERRITIN: Ferritin: 51 ng/mL (ref 11–307)

## 2023-12-01 LAB — BASIC METABOLIC PANEL WITH GFR
Anion gap: 5 (ref 5–15)
Anion gap: 9 (ref 5–15)
BUN: 40 mg/dL — ABNORMAL HIGH (ref 8–23)
BUN: 38 mg/dL — ABNORMAL HIGH (ref 8–23)
CO2: 20 mmol/L — ABNORMAL LOW (ref 22–32)
CO2: 21 mmol/L — ABNORMAL LOW (ref 22–32)
Calcium: 8.3 mg/dL — ABNORMAL LOW (ref 8.9–10.3)
Calcium: 8.4 mg/dL — ABNORMAL LOW (ref 8.9–10.3)
Chloride: 112 mmol/L — ABNORMAL HIGH (ref 98–111)
Chloride: 107 mmol/L (ref 98–111)
Creatinine, Ser: 1.83 mg/dL — ABNORMAL HIGH (ref 0.44–1.00)
Creatinine, Ser: 1.66 mg/dL — ABNORMAL HIGH (ref 0.44–1.00)
GFR, Estimated: 28 mL/min — ABNORMAL LOW (ref >60)
GFR, Estimated: 31 mL/min — ABNORMAL LOW (ref >60)
Glucose, Bld: 137 mg/dL — ABNORMAL HIGH (ref 70–99)
Glucose, Bld: 142 mg/dL — ABNORMAL HIGH (ref 70–99)
Potassium: 5.4 mmol/L — ABNORMAL HIGH (ref 3.5–5.1)
Potassium: 5.7 mmol/L — ABNORMAL HIGH (ref 3.5–5.1)
Sodium: 137 mmol/L (ref 135–145)
Sodium: 137 mmol/L (ref 135–145)

## 2023-12-01 LAB — IRON AND TIBC
Iron: 49 ug/dL (ref 28–170)
Saturation Ratios: 19 % (ref 10.4–31.8)
TIBC: 256 ug/dL (ref 250–450)
UIBC: 207 ug/dL

## 2023-12-01 LAB — RETICULOCYTES
Immature Retic Fract: 15.8 % (ref 2.3–15.9)
RBC.: 3.78 MIL/uL — ABNORMAL LOW (ref 3.87–5.11)
Retic Count, Absolute: 66.2 K/uL (ref 19.0–186.0)
Retic Ct Pct: 1.8 % (ref 0.4–3.1)

## 2023-12-01 LAB — BILIRUBIN, FRACTIONATED(TOT/DIR/INDIR)
Bilirubin, Direct: <0.1 mg/dL (ref 0.0–0.2)
Total Bilirubin: 0.4 mg/dL (ref 0.0–1.2)

## 2023-12-01 MED ORDER — AMLODIPINE BESYLATE 10 MG PO TABS
10.0000 mg | ORAL_TABLET | Freq: Every day | ORAL | Status: DC
  Administered 2023-12-01: 10 mg via ORAL
  Filled 2023-12-01: qty 2
  Filled 2023-12-01: qty 1

## 2023-12-01 MED ORDER — CARVEDILOL 3.125 MG PO TABS
3.1250 mg | ORAL_TABLET | Freq: Two times a day (BID) | ORAL | Status: DC
  Administered 2023-12-01: 3.125 mg via ORAL
  Filled 2023-12-01: qty 1

## 2023-12-01 MED ORDER — FUROSEMIDE 20 MG PO TABS
20.0000 mg | ORAL_TABLET | Freq: Two times a day (BID) | ORAL | Status: DC
  Administered 2023-12-01 – 2023-12-02 (×2): 20 mg via ORAL
  Filled 2023-12-01 (×2): qty 1

## 2023-12-01 MED ORDER — SODIUM ZIRCONIUM CYCLOSILICATE 5 G PO PACK
5.0000 g | PACK | Freq: Once | ORAL | Status: AC
  Administered 2023-12-01: 5 g via ORAL
  Filled 2023-12-01: qty 1

## 2023-12-01 NOTE — Assessment & Plan Note (Addendum)
 Uncontrolled.  Appears to have history of difficult to control SBP.  She is on amlodipine , clonidine , carvedilol  at home. -Continue amlodipine  5 daily, clonidine  0.1 mg daily -Given bradycardia, will decrease dose of carvedilol  to 6.125 mg daily -Consider long-term hydralazine  versus labetalol  for blood pressure control given her renal disease

## 2023-12-01 NOTE — Assessment & Plan Note (Signed)
 HLD  PVD: Continue Ezetimibe  10 mg tablet, Rosuvastatin  10 mg tablet T2DM: Last A1c 6.6, no chronic medications. Normocytic anemia: Likely anemia of chronic disease due to CKD. Retic count, ferritin unremarkable.

## 2023-12-01 NOTE — Progress Notes (Signed)
 New Admission Note:   Arrival Method:  stretcher Mental Orientation: alert Telemetry: box 1 Assessment: Completed Skin: see flowsheet IV: NSL Pain: none Tubes:none Safety Measures: Safety Fall Prevention Plan has been discussed Admission: Completed [facility] Orientation: Patient has been orientated to the room, unit and staff.  Family: none at bedside  Orders have been reviewed and implemented. Will continue to monitor the patient. Call light has been placed within reach and bed alarm has been activated.   Channing Na BSN, RN Phone number: [phone]

## 2023-12-01 NOTE — Assessment & Plan Note (Addendum)
 Hx of medication noncompliance. Hx of difficult-to-control SBP. Home regiment of amlodipine , clonidine , carvedilol . Holding carvedilol  given bradycardia. - Amlodipine  increased to 10 mg daily - Continue clonidine  0.1 mg daily - Hold carvedilol 

## 2023-12-01 NOTE — Assessment & Plan Note (Addendum)
 In setting of anemia of chronic disease from CKD most likely. No endorsed bleeding. -AM CBC -Iron studies -Retic count to evaluate hemolysis given elevated K, will check bilirubin

## 2023-12-01 NOTE — Assessment & Plan Note (Addendum)
 Pt with baseline Cr around 2, GFR in 20s. No indication for IV fluids. No indication for nephrology consult. - Will continue to monitor Cr with repeat BMP - PO intake encouraged

## 2023-12-01 NOTE — Care Management Obs Status (Signed)
 MEDICARE OBSERVATION STATUS NOTIFICATION   Patient Details  Name: Lauren Craig MRN: [id number] Date of Birth: 11-12-1945   Medicare Observation Status Notification Given:  Yes    Jon Cruel 12/01/2023, 3:31 PM

## 2023-12-01 NOTE — Progress Notes (Signed)
 pt bradycardia low as 39 and has been sustaining in 40s in report this AM was told she had been in the 50s pt asymptomatic MD notified

## 2023-12-01 NOTE — Discharge Instructions (Addendum)
 Lauren Craig,  Thank you for letting us  care for you during your stay. You were admitted to the [facility] Medicine Teaching Service for high potassium. We think this is related to your chronic kidney disease. You were treated with a medication called Lokelma .   Other important diagnoses identified during your stay were bradycardia (low heart rate). We think this is because of one of your blood pressure medications, carvedilol , and so stopped this. Your heart rate improved after we stopped this medication.  There are a few things for you to do after your hospitalization: Limit the amount of foods high in potassium that you eat. Examples of these foods are: cantaloupe, watermelons, grapefruit, dried fruits and fruit juices, avocadoes, tomatoes, potatoes (plain and sweet), Brussels sprouts, milk, yogurt, lentils, and most nuts (except peanuts)   Follow up with your nephrologist (kidney doctor); your primary care physician can help you with this referral.   Please follow up with your primary care physician in 1 week.   If your symptoms worsen or return, please return to the hospital. Please let us  know if you have questions about your stay at [facility]. Thank you for allowing us  to participate in your care!  DOCTOR'S APPOINTMENT   Future Appointments  Date Time Provider Department Center  12/04/2023  7:05 AM CVD HVT DEVICE REMOTES [facility] H&V  12/05/2023  3:30 PM Elicia Hamlet, MD [facility]  01/04/2024  7:25 AM CVD HVT DEVICE REMOTES [facility] H&V  02/05/2024  7:25 AM CVD HVT DEVICE REMOTES [facility] H&V  03/07/2024  7:25 AM CVD HVT DEVICE REMOTES [facility] H&V  04/15/2024 11:10 AM [facility] ANNUAL WELLNESS VISIT [facility]    Follow-up Information     Elicia Hamlet, MD Follow up on 12/05/2023.   Specialty: Family Medicine Why: at 3:30 PM Contact information: [address] [place] [address] [phone]                 Take care and be well!  Family Medicine  Teaching Service Inpatient Team Ruby  [facility]  [address] [place], [place] [address] [phone]

## 2023-12-01 NOTE — Plan of Care (Signed)
 Notified by RN that patient refused Lokelma , even after instructed on importance given her high potassium level.  Will await morning CMP to monitor.

## 2023-12-01 NOTE — Hospital Course (Addendum)
 Lauren Craig is a 78 y.o. year old with a history of CKD4, HFpEF, HTN, normocytic anemia, HLD, PVD, T2DM who presented with asymptomatic hyperkalemia and was admitted to the [facility] Medicine Teaching Service for hyperkalemia.  Hyperkalemia Pt presented with K of 6.4; initially refused Lokelma . EKG unremarkable. Pt required Lokelma  5 g once on 7/4 for K of 5.7. K was *** at time of discharge. Hyperkalemia suspected to be secondary to CKD4.  Bradycardia Pt with HR in 40s-50s at time of admission. Carvedilol  was held ***with improvement in bradycardia; this was not resumed at discharge.  CKD4 GFR in 20s with baseline Cr around 2. Pt did not need IV fluids during hospitalization and was maintained on PO fluids. No indication for nephrology consult during hospitalization, though outpatient follow-up recommended.  HFpEF Pt with hx of HFpEF w/ grade II diastolic dysfunction. Mild BLE edema, considered contribution from CKD vs HFpEF. Home Lasix  20 mg BID continued during hospitalization.  Hypertension Hx of difficult-to-control SBP with medication noncompliance; on home amlodipine , clonidine , carvedilol . Carvedilol  was held in setting of bradycardia; amlodipine  increased to 10 mg daily and clonidine  continued at 0.1 mg daily.  Other chronic conditions were medically managed with home medications and formulary alternatives as necessary (HLD/PVD, T2DM, anemia).  PCP Follow-up Recommendations: Referral to nephrology outpatient Consider long-term hydralazine  versus labetalol  for blood pressure control given her renal disease Close follow-up with bradycardia status post discontinuation of carvedilol  Ensure follow-up with Electrocardiology

## 2023-12-01 NOTE — Assessment & Plan Note (Addendum)
 K now 5.4 from 5.7 yesterday 7/3. Did not get Lokelma  yesterday given pt refusal, K downtrended on own. Will monitor with repeat BMP this afternoon and consider need for Lokelma  at that point (pt now willing to take).  - Repeat BMP ~1500 - Consider Lokelma  following BMP - Refer back to nephro outpatient

## 2023-12-01 NOTE — Assessment & Plan Note (Addendum)
 Per chart review, pt's HR has been in the 50s-60s since 04/2023. HR was in 70s-80s per visits earlier in 2024. Asymptomatic today. Suspect low HR due to B-blocker; will stop for now. - Hold carvedilol  - Will continue to monitor on tele - Consider cardiology consult if patient becomes symptomatic or if bradycardia worsens

## 2023-12-01 NOTE — Assessment & Plan Note (Addendum)
 New to patient. Possibly due to CKD versus HFpEF.  - Will continue Lasix  as above; no further workup indicated

## 2023-12-01 NOTE — Progress Notes (Signed)
 Daily Progress Note Intern Pager: [phone]  Patient name: Lauren Craig Medical record number: [id number] Date of birth: 1945/10/23 Age: 78 y.o. Gender: female  Primary Care Provider: Delores Suzann HERO, MD Consultants: None Code Status: Full  Pt Overview and Major Events to Date:  - 7/3: Admitted  Medical Decision Making:  Lauren Craig is a 78 y.o. female admitted for hyperkalemia (6.4). Pertinent PMH/PSH includes CKD 4, T2DM, HLD, HTN, hx left MCA CVA.  Assessment & Plan Hyperkalemia K now 5.4 from 5.7 yesterday 7/3. Did not get Lokelma  yesterday given pt refusal, K downtrended on own. Will monitor with repeat BMP this afternoon and consider need for Lokelma  at that point (pt now willing to take).  - Repeat BMP ~1500 - Consider Lokelma  following BMP - Refer back to nephro outpatient Bradycardia Per chart review, pt's HR has been in the 50s-60s since 04/2023. HR was in 70s-80s per visits earlier in 2024. Asymptomatic today. Suspect low HR due to B-blocker; will stop for now. - Hold carvedilol  - Will continue to monitor on tele - Consider cardiology consult if patient becomes symptomatic or if bradycardia worsens CKD (chronic kidney disease) stage 4, GFR 15-29 ml/min (HCC) Pt with baseline Cr around 2, GFR in 20s. No indication for IV fluids. No indication for nephrology consult. - Will continue to monitor Cr with repeat BMP - PO intake encouraged (HFpEF) heart failure with preserved ejection fraction (HCC) Pt with minimal leg edema, otherwise euvolemic. - Continue Lasix  20 mg BID Leg swelling New to patient. Possibly due to CKD versus HFpEF.  - Will continue Lasix  as above; no further workup indicated Hypertension Hx of medication noncompliance. Hx of difficult-to-control SBP. Home regiment of amlodipine , clonidine , carvedilol . Holding carvedilol  given bradycardia. - Amlodipine  increased to 10 mg daily - Continue clonidine  0.1 mg daily - Hold carvedilol  Chronic health  problem HLD  PVD: Continue Ezetimibe  10 mg tablet, Rosuvastatin  10 mg tablet T2DM: Last A1c 6.6, no chronic medications. Normocytic anemia: Likely anemia of chronic disease due to CKD. Retic count, ferritin unremarkable.  FEN/GI: Renal Diet PPx: Lovenox  Dispo:Home tomorrow. Barriers include continued monitoring of bradycardia.   Subjective:  Overnight, pt refused Lokelma , otherwise no other events.  Today, pt reports she is feeling well with no concerns. No palpitations, chest pain. No lightheadedness/dizziness.   Objective: Temp:  [97.5 F (36.4 C)-98.1 F (36.7 C)] 98.1 F (36.7 C) (07/04 0729) Pulse Rate:  [48-61] 48 (07/04 0729) Resp:  [14-20] 18 (07/04 0729) BP: (156-215)/(45-66) 161/45 (07/04 0729) SpO2:  [93 %-100 %] 93 % (07/04 0729) Weight:  [73.8 kg] 73.8 kg (07/03 1616) Physical Exam: General: Pt sitting up in bed, no acute distress. Cardiovascular: Bradycardia with regular rhythm, no murmurs/rubs/gallops. Respiratory: Normal work of breathing on room air. Clear to auscultation bilaterally; no wheezes, crackles. Abdomen: Bowel sounds present and normoactive bilaterally. Soft, nondistended, nontender. Extremities: Minimal bilateral lower extremity edema. Skin warm, dry.  Laboratory: Most recent CBC Lab Results  Component Value Date   WBC 6.8 12/01/2023   HGB 9.9 (L) 12/01/2023   HCT 31.3 (L) 12/01/2023   MCV 83.7 12/01/2023   PLT 209 12/01/2023  Retic Ct 1.8 Tbili 0.4, Direct Bili < 0.1 Iron 49, TIBC 256, Ferritin 51   Most recent BMP    Latest Ref Rng & Units 12/01/2023    4:20 AM  BMP  Glucose 70 - 99 mg/dL 862   BUN 8 - 23 mg/dL 40   Creatinine 9.55 - 1.00 mg/dL 8.16  Sodium 135 - 145 mmol/L 137   Potassium 3.5 - 5.1 mmol/L 5.4   Chloride 98 - 111 mmol/L 112   CO2 22 - 32 mmol/L 20   Calcium  8.9 - 10.3 mg/dL 8.3     [name], MD 12/01/2023, 9:53 AM  PGY-1, [facility] FPTS Intern pager: [phone], text pages  welcome Secure chat group [facility] Teaching Service

## 2023-12-01 NOTE — Assessment & Plan Note (Deleted)
   In setting of anemia of chronic disease from CKD most likely. No endorsed bleeding. -AM CBC -Iron studies -Retic count to evaluate hemolysis given elevated K, will check bilirubin

## 2023-12-01 NOTE — Assessment & Plan Note (Addendum)
 Pt with minimal leg edema, otherwise euvolemic. - Continue Lasix  20 mg BID

## 2023-12-01 NOTE — Progress Notes (Signed)
 Manual HR 50 left radial

## 2023-12-01 NOTE — Assessment & Plan Note (Addendum)
 Patient with prior diagnosis of HFpEF w/ grade II diastolic dysfunction. Does not appear fluid overloaded on exam, denies SoB.  She does take furosemide  20 mg twice daily for bilateral lower extremity edema.  BNP 353 consistent with previous BNP 12/24 and down from BNP of 575 2/24.  - Continue to monitor clinically - Furosemide  20 mg twice daily

## 2023-12-01 NOTE — Plan of Care (Signed)
 FMTS Interim Progress Note  S: Went to see pt after alert from nurse that HR was 39 bpm. Pt was eating lunch, no reports of chest pain or shortness of breath, no palpitations, no lightheadedness, dizziness, increased fatigue. Reports she has had overall more fatigue since summer of 2024 when her daughter moved her into a nursing home and sold her house. No increase in fatigue level over past couple weeks per patient.  O: BP (!) 161/45 (BP Location: Left Arm)   Pulse (!) 48   Temp 98.1 F (36.7 C)   Resp 18   Ht 5' 4 (1.626 m)   Wt 73.8 kg   SpO2 93%   BMI 27.93 kg/m    General: Pt is sitting up in bed eating lunch. No acute distress. Cardiovascular: Sinus brady with manual HR of 44 bpm. No murmurs/rubs/gallops. Pulmonary: Normal work of breathing on room air. Lungs clear to auscultation bilaterally in upper frontal fields Extremities: Radial pulses 2+ bilaterally, DP pulses difficult to palpate bilaterally. Neuro: Alert and oriented to person, place, and year (unsure of day of week, date). Responding to questions appropriately. No focal neurological deficits.   A/P: Bradycardia Pt remains symptomatic with HR in 40s. Carvedilol  has been discontinued; she did get a dose today at ~0800. No indication to consult cards at this time; consider based on symptoms. - Hold carvedilol  - Will continue to monitor HR as carvedilol  wears off  [name], MD 12/01/2023, 12:27 PM PGY-1, [facility] Family Medicine Service pager [phone]

## 2023-12-02 DIAGNOSIS — Z7982 Long term (current) use of aspirin: Secondary | ICD-10-CM | POA: Diagnosis not present

## 2023-12-02 DIAGNOSIS — E785 Hyperlipidemia, unspecified: Secondary | ICD-10-CM | POA: Diagnosis not present

## 2023-12-02 DIAGNOSIS — E875 Hyperkalemia: Secondary | ICD-10-CM | POA: Diagnosis not present

## 2023-12-02 DIAGNOSIS — Z880 Allergy status to penicillin: Secondary | ICD-10-CM | POA: Diagnosis not present

## 2023-12-02 DIAGNOSIS — N189 Chronic kidney disease, unspecified: Secondary | ICD-10-CM | POA: Diagnosis not present

## 2023-12-02 DIAGNOSIS — E1122 Type 2 diabetes mellitus with diabetic chronic kidney disease: Secondary | ICD-10-CM | POA: Diagnosis not present

## 2023-12-02 DIAGNOSIS — D631 Anemia in chronic kidney disease: Secondary | ICD-10-CM | POA: Diagnosis not present

## 2023-12-02 DIAGNOSIS — I130 Hypertensive heart and chronic kidney disease with heart failure and stage 1 through stage 4 chronic kidney disease, or unspecified chronic kidney disease: Secondary | ICD-10-CM | POA: Diagnosis not present

## 2023-12-02 DIAGNOSIS — Z888 Allergy status to other drugs, medicaments and biological substances status: Secondary | ICD-10-CM | POA: Diagnosis not present

## 2023-12-02 DIAGNOSIS — E538 Deficiency of other specified B group vitamins: Secondary | ICD-10-CM | POA: Diagnosis not present

## 2023-12-02 DIAGNOSIS — Z8673 Personal history of transient ischemic attack (TIA), and cerebral infarction without residual deficits: Secondary | ICD-10-CM | POA: Diagnosis not present

## 2023-12-02 DIAGNOSIS — I639 Cerebral infarction, unspecified: Secondary | ICD-10-CM | POA: Diagnosis not present

## 2023-12-02 DIAGNOSIS — Z87891 Personal history of nicotine dependence: Secondary | ICD-10-CM | POA: Diagnosis not present

## 2023-12-02 DIAGNOSIS — Z79899 Other long term (current) drug therapy: Secondary | ICD-10-CM | POA: Diagnosis not present

## 2023-12-02 DIAGNOSIS — N184 Chronic kidney disease, stage 4 (severe): Secondary | ICD-10-CM | POA: Diagnosis not present

## 2023-12-02 DIAGNOSIS — E876 Hypokalemia: Secondary | ICD-10-CM | POA: Diagnosis not present

## 2023-12-02 DIAGNOSIS — M7989 Other specified soft tissue disorders: Secondary | ICD-10-CM | POA: Diagnosis not present

## 2023-12-02 DIAGNOSIS — F32A Depression, unspecified: Secondary | ICD-10-CM | POA: Diagnosis not present

## 2023-12-02 DIAGNOSIS — E1151 Type 2 diabetes mellitus with diabetic peripheral angiopathy without gangrene: Secondary | ICD-10-CM | POA: Diagnosis not present

## 2023-12-02 DIAGNOSIS — Z95818 Presence of other cardiac implants and grafts: Secondary | ICD-10-CM | POA: Diagnosis not present

## 2023-12-02 DIAGNOSIS — Z89422 Acquired absence of other left toe(s): Secondary | ICD-10-CM | POA: Diagnosis not present

## 2023-12-02 DIAGNOSIS — N179 Acute kidney failure, unspecified: Secondary | ICD-10-CM | POA: Diagnosis not present

## 2023-12-02 DIAGNOSIS — Z91148 Patient's other noncompliance with medication regimen for other reason: Secondary | ICD-10-CM | POA: Diagnosis not present

## 2023-12-02 DIAGNOSIS — I5032 Chronic diastolic (congestive) heart failure: Secondary | ICD-10-CM | POA: Diagnosis not present

## 2023-12-02 DIAGNOSIS — R001 Bradycardia, unspecified: Secondary | ICD-10-CM | POA: Diagnosis not present

## 2023-12-02 LAB — BASIC METABOLIC PANEL WITH GFR
Anion gap: 7 (ref 5–15)
Anion gap: 10 (ref 5–15)
Anion gap: 10 (ref 5–15)
BUN: 39 mg/dL — ABNORMAL HIGH (ref 8–23)
BUN: 38 mg/dL — ABNORMAL HIGH (ref 8–23)
BUN: 38 mg/dL — ABNORMAL HIGH (ref 8–23)
CO2: 20 mmol/L — ABNORMAL LOW (ref 22–32)
CO2: 19 mmol/L — ABNORMAL LOW (ref 22–32)
CO2: 20 mmol/L — ABNORMAL LOW (ref 22–32)
Calcium: 8.4 mg/dL — ABNORMAL LOW (ref 8.9–10.3)
Calcium: 8.5 mg/dL — ABNORMAL LOW (ref 8.9–10.3)
Calcium: 8.4 mg/dL — ABNORMAL LOW (ref 8.9–10.3)
Chloride: 109 mmol/L (ref 98–111)
Chloride: 109 mmol/L (ref 98–111)
Chloride: 107 mmol/L (ref 98–111)
Creatinine, Ser: 1.96 mg/dL — ABNORMAL HIGH (ref 0.44–1.00)
Creatinine, Ser: 1.9 mg/dL — ABNORMAL HIGH (ref 0.44–1.00)
Creatinine, Ser: 2.12 mg/dL — ABNORMAL HIGH (ref 0.44–1.00)
GFR, Estimated: 26 mL/min — ABNORMAL LOW (ref >60)
GFR, Estimated: 27 mL/min — ABNORMAL LOW (ref >60)
GFR, Estimated: 23 mL/min — ABNORMAL LOW (ref >60)
Glucose, Bld: 134 mg/dL — ABNORMAL HIGH (ref 70–99)
Glucose, Bld: 150 mg/dL — ABNORMAL HIGH (ref 70–99)
Glucose, Bld: 141 mg/dL — ABNORMAL HIGH (ref 70–99)
Potassium: 5.3 mmol/L — ABNORMAL HIGH (ref 3.5–5.1)
Potassium: 5.4 mmol/L — ABNORMAL HIGH (ref 3.5–5.1)
Potassium: 5.2 mmol/L — ABNORMAL HIGH (ref 3.5–5.1)
Sodium: 136 mmol/L (ref 135–145)
Sodium: 138 mmol/L (ref 135–145)
Sodium: 137 mmol/L (ref 135–145)

## 2023-12-02 LAB — MAGNESIUM: Magnesium: 1.8 mg/dL (ref 1.7–2.4)

## 2023-12-02 MED ORDER — AMLODIPINE BESYLATE 5 MG PO TABS
5.0000 mg | ORAL_TABLET | Freq: Every day | ORAL | Status: DC
  Filled 2023-12-02: qty 1

## 2023-12-02 MED ORDER — FUROSEMIDE 20 MG PO TABS: 20.0000 mg | ORAL_TABLET | Freq: Every day | ORAL | Status: DC

## 2023-12-02 MED ORDER — SODIUM ZIRCONIUM CYCLOSILICATE 10 G PO PACK
10.0000 g | PACK | Freq: Once | ORAL | Status: AC
  Administered 2023-12-02: 10 g via ORAL
  Filled 2023-12-02: qty 1

## 2023-12-02 NOTE — Assessment & Plan Note (Signed)
 Hx of medication noncompliance. Hx of difficult-to-control SBP. Home regiment of amlodipine , clonidine , carvedilol . Holding carvedilol  given bradycardia. - Amlodipine  increased to 10 mg daily - Continue clonidine  0.1 mg twice daily - Hold carvedilol 

## 2023-12-02 NOTE — Assessment & Plan Note (Signed)
 Potassium continues to remain mildly elevated.  Today 5.3.  Likely secondary to CKD. - Repeat BMP 10 AM - Consider Lokelma  following BMP - Refer back to nephro outpatient

## 2023-12-02 NOTE — Assessment & Plan Note (Addendum)
 HLD  PVD: Continue Ezetimibe  10 mg tablet, Rosuvastatin  10 mg tablet, Aspirin  81mg  T2DM: Last A1c 6.6, no chronic medications. Normocytic anemia: Likely anemia of chronic disease due to CKD. Retic count, ferritin unremarkable. CKD - monitor with BMPs

## 2023-12-02 NOTE — Assessment & Plan Note (Addendum)
 Bradycardia to 40s overnight despite discontinuation of Coreg .  Remains asymptomatic. - Hold carvedilol  - Continuous telemetry - Consider cardiology consult if patient becomes symptomatic or if bradycardia worsens

## 2023-12-02 NOTE — Progress Notes (Signed)
     Daily Progress Note Intern Pager: [phone]  Patient name: Lauren Craig Medical record number: [id number] Date of birth: 09/06/1945 Age: 78 y.o. Gender: female  Primary Care Provider: Delores Suzann HERO, MD Consultants: None Code Status: Full  Pt Overview and Major Events to Date:  -7/3 admitted  Medical Decision Making:  Lauren Craig is a 78 year old female admitted for hyperkalemia in the setting of new onset subacute fatigue.  Undergoing workup for hyperkalemia and potential for symptomatic bradycardia.  Pertinent PMH/PSH includes CKD 4, type 2 diabetes, HLD, hypertension, history of left MCA CVA.  Assessment & Plan Hyperkalemia Potassium continues to remain mildly elevated.  Today 5.3.  Likely secondary to CKD. - Repeat BMP 10 AM - Consider Lokelma  following BMP - Refer back to nephro outpatient Bradycardia Bradycardia to 40s overnight despite discontinuation of Coreg .  Remains asymptomatic. - Hold carvedilol  - Continuous telemetry - Consider cardiology consult if patient becomes symptomatic or if bradycardia worsens (HFpEF) heart failure with preserved ejection fraction (HCC) Leg swelling Pt with minimal leg edema, otherwise euvolemic. - Continue Lasix  20 mg BID Hypertension Hx of medication noncompliance. Hx of difficult-to-control SBP. Home regiment of amlodipine , clonidine , carvedilol . Holding carvedilol  given bradycardia. - Amlodipine  increased to 10 mg daily - Continue clonidine  0.1 mg twice daily - Hold carvedilol  Chronic health problem HLD  PVD: Continue Ezetimibe  10 mg tablet, Rosuvastatin  10 mg tablet, Aspirin  81mg  T2DM: Last A1c 6.6, no chronic medications. Normocytic anemia: Likely anemia of chronic disease due to CKD. Retic count, ferritin unremarkable. CKD - monitor with BMPs   FEN/GI: Renal diet PPx: Lovenox  Dispo: Likely home today  Subjective:  No acute events overnight.  Telemetry reviewed multiple events for heart rate between 40-45.   Patient today reports no dyspnea or chest pain.  States she is at increased fatigue in general but does not correlated with exertion.  Objective: Temp:  [98.1 F (36.7 C)-98.2 F (36.8 C)] 98.2 F (36.8 C) (07/05 0423) Pulse Rate:  [43-52] 52 (07/05 0552) Resp:  [16-18] 16 (07/05 0423) BP: (130-169)/(36-53) 160/42 (07/05 0537) SpO2:  [90 %-94 %] 90 % (07/05 0537) Physical Exam: General: NAD  Neuro: A&O Cardiovascular: Bradycardic, regular rhythm, no murmurs, no peripheral edema Respiratory: normal WOB on RA, CTAB, no wheezes, ronchi or rales Extremities: Moving all 4 extremities equally   Laboratory: Most recent CBC Lab Results  Component Value Date   WBC 6.8 12/01/2023   HGB 9.9 (L) 12/01/2023   HCT 31.3 (L) 12/01/2023   MCV 83.7 12/01/2023   PLT 209 12/01/2023   Most recent BMP    Latest Ref Rng & Units 12/02/2023    3:30 AM  BMP  Glucose 70 - 99 mg/dL 865   BUN 8 - 23 mg/dL 39   Creatinine 9.55 - 1.00 mg/dL 8.03   Sodium 864 - 854 mmol/L 136   Potassium 3.5 - 5.1 mmol/L 5.3   Chloride 98 - 111 mmol/L 109   CO2 22 - 32 mmol/L 20   Calcium  8.9 - 10.3 mg/dL 8.4     Magnesium  1.8  Imaging/Diagnostic Tests: No new imaging.  Lauren Sharper, MD 12/02/2023, 7:54 AM  PGY-3, [facility] Family Medicine FPTS Intern pager: [phone], text pages welcome Secure chat group [facility] Teaching Service

## 2023-12-02 NOTE — Assessment & Plan Note (Deleted)
 Pt with baseline Cr around 2, GFR in 20s. No indication for IV fluids. No indication for nephrology consult. - Will continue to monitor Cr with repeat BMP - PO intake encouraged

## 2023-12-02 NOTE — Assessment & Plan Note (Signed)
 Pt with minimal leg edema, otherwise euvolemic. - Continue Lasix  20 mg BID

## 2023-12-02 NOTE — Plan of Care (Signed)
   Problem: Education: Goal: Knowledge of General Education information will improve Description: Including pain rating scale, medication(s)/side effects and non-pharmacologic comfort measures Outcome: Progressing   Problem: Clinical Measurements: Goal: Respiratory complications will improve Outcome: Progressing

## 2023-12-03 ENCOUNTER — Inpatient Hospital Stay (HOSPITAL_COMMUNITY)

## 2023-12-03 DIAGNOSIS — M7989 Other specified soft tissue disorders: Secondary | ICD-10-CM | POA: Diagnosis not present

## 2023-12-03 DIAGNOSIS — N179 Acute kidney failure, unspecified: Secondary | ICD-10-CM

## 2023-12-03 DIAGNOSIS — E875 Hyperkalemia: Secondary | ICD-10-CM | POA: Diagnosis not present

## 2023-12-03 DIAGNOSIS — N189 Chronic kidney disease, unspecified: Secondary | ICD-10-CM

## 2023-12-03 LAB — BASIC METABOLIC PANEL WITH GFR
Anion gap: 13 (ref 5–15)
BUN: 36 mg/dL — ABNORMAL HIGH (ref 8–23)
CO2: 21 mmol/L — ABNORMAL LOW (ref 22–32)
Calcium: 8.9 mg/dL (ref 8.9–10.3)
Chloride: 103 mmol/L (ref 98–111)
Creatinine, Ser: 2.09 mg/dL — ABNORMAL HIGH (ref 0.44–1.00)
GFR, Estimated: 24 mL/min — ABNORMAL LOW (ref >60)
Glucose, Bld: 136 mg/dL — ABNORMAL HIGH (ref 70–99)
Potassium: 5.4 mmol/L — ABNORMAL HIGH (ref 3.5–5.1)
Sodium: 137 mmol/L (ref 135–145)

## 2023-12-03 LAB — CORTISOL-AM, BLOOD: Cortisol - AM: 15.2 ug/dL (ref 6.7–22.6)

## 2023-12-03 MED ORDER — AMLODIPINE BESYLATE 5 MG PO TABS
5.0000 mg | ORAL_TABLET | Freq: Two times a day (BID) | ORAL | Status: DC
  Administered 2023-12-03: 5 mg via ORAL
  Filled 2023-12-03: qty 1

## 2023-12-03 MED ORDER — SODIUM CHLORIDE 0.9 % IV BOLUS
250.0000 mL | Freq: Once | INTRAVENOUS | Status: AC
  Administered 2023-12-03: 250 mL via INTRAVENOUS

## 2023-12-03 MED ORDER — AMLODIPINE BESYLATE 5 MG PO TABS: 5.0000 mg | ORAL_TABLET | Freq: Every day | ORAL | Status: DC

## 2023-12-03 MED ORDER — AMLODIPINE BESYLATE 5 MG PO TABS
5.0000 mg | ORAL_TABLET | Freq: Once | ORAL | Status: AC
  Administered 2023-12-03: 5 mg via ORAL
  Filled 2023-12-03: qty 1

## 2023-12-03 MED ORDER — AMLODIPINE BESYLATE 5 MG PO TABS: 5.0000 mg | ORAL_TABLET | Freq: Two times a day (BID) | ORAL | Status: DC

## 2023-12-03 MED ORDER — AMLODIPINE BESYLATE 10 MG PO TABS: 10.0000 mg | ORAL_TABLET | Freq: Every day | ORAL | Status: DC

## 2023-12-03 NOTE — Assessment & Plan Note (Addendum)
 Hx of difficult-to-control SBP, medication noncompliance. Home regimen of amlodipine , clonidine , carvedilol . Holding carvedilol  in setting of bradycardia; amlodipine  held 7/5 in setting of low DBP; got one dose amlodipine  5 mg this AM. - Monitor BP:  - if SBP > 150 and DBP > 40, give additional 5 mg amlodipine  this PM and start 10 mg amlodipine  daily tomorrow 7/7  - if DBP < 40, do not give additional amlodipine  tonight and start 5 mg amlodipine  daily tomorrow 7/7 - Continue clonidine  0.1 mg twice daily - Continue to hold carvedilol 

## 2023-12-03 NOTE — Plan of Care (Signed)
 FMTS Interim Progress Note  S: Consulted with Dr. Dolan, nephrology, on the phone about the pt's recurrent hyperkalemic episodes in setting of CKD4. He recommended giving Lokelma  for K levels 5.5 and above while pt is inpatient; at discharge, can start pt on Lokelma  10g daily. He also recommended a renal US  to further evaluate kidneys. He recommended pt be followed outpatient by nephrology, but did not see an indication for pt to be followed inpatient.  O: BP (!) 182/91 (BP Location: Left Arm)   Pulse (!) 48   Temp (!) 97.5 F (36.4 C) (Oral)   Resp 16   Ht 5' 4 (1.626 m)   Wt 73.8 kg   SpO2 96%   BMI 27.93 kg/m    A/P: CKD4  Hyperkalemia No additional Lokelma  indicated per nephrology at this time given K of 5.4; will continue to monitor.  - Give Lokelma  inpatient for K >= 5.5 - Start Lokelma  10 g daily at discharge - Will order Renal US   [name], MD 12/03/2023, 11:29 AM PGY-1, [facility] Family Medicine Service pager [phone]

## 2023-12-03 NOTE — Progress Notes (Signed)
 Daily Progress Note Intern Pager: [phone]  Patient name: Lauren Craig Medical record number: [id number] Date of birth: 29-Jan-1946 Age: 78 y.o. Gender: female  Primary Care Provider: Delores Suzann HERO, MD Consultants: None Code Status: FULL  Pt Overview and Major Events to Date:  -7/3 admitted  Medical Decision Making:  Lauren Craig is a 78 y.o. female admitted for hyperkalemia. Unclear reason for hyperkalemia, has been persistent and present prior to this hospitalization with prior use of Lokelma . Considering etiology of CKD with poor renal clearance. Undergoing workup to rule out hypoaldosteronism today via renin, aldosterone, AM cortisol labs.  Planning to consult nephrology re: Cr above 2 (2.09 today) in setting of persistent hyperkalemia (K 5.4 today) and CKD4; AM cortisol WNL at 15.2, aldosterone + renin pending. Hx HFpEF, Lasix  being held currently. Ordered 250 mL NS bolus this morning after Cr returned.  Pertinent PMH/PSH includes CKD 4, HFpEF, type 2 diabetes, HLD, hypertension, history of left MCA CVA, depression. Assessment & Plan Hyperkalemia K remains elevated at 5.4. No indication for repeat Lokelma . AM cortisol WNL (15.2), renin and aldosterone pending but not high concern for hypoaldosteronism. - Will consult nephro - Continue to monitor K with repeat BMP in AM - Appreciate nephro recs - Pt to follow up with nephro outpatient - PT/OT consult placed Bradycardia HR this morning of 50. Asymptomatic. - Continue holding carvedilol  - Continuous telemetry - Consider cardiology consult if patient becomes symptomatic or if bradycardia worsens CKD (chronic kidney disease) stage 4, GFR 15-29 ml/min (HCC) Baseline Cr around 2. Cr of 2.09 today; giving fluid bolus and nephro consulted. - Bolus with 250 mL NS - Appreciate nephro recs (HFpEF) heart failure with preserved ejection fraction (HCC) Leg swelling None to minimal leg edema. Lasix  held in setting of AKI (home  dose of 20 mg daily). - Continue to hold Lasix  Hypertension Hx of difficult-to-control SBP, medication noncompliance. Home regimen of amlodipine , clonidine , carvedilol . Holding carvedilol  in setting of bradycardia; amlodipine  held 7/5 in setting of low DBP; got one dose amlodipine  5 mg this AM. - Monitor BP:  - if SBP > 150 and DBP > 40, give additional 5 mg amlodipine  this PM and start 10 mg amlodipine  daily tomorrow 7/7  - if DBP < 40, do not give additional amlodipine  tonight and start 5 mg amlodipine  daily tomorrow 7/7 - Continue clonidine  0.1 mg twice daily - Continue to hold carvedilol  Chronic health problem HLD  PVD: Continue Ezetimibe  10 mg tablet, Rosuvastatin  10 mg tablet, Aspirin  81mg  T2DM: Last A1c 6.6, no chronic medications. Normocytic anemia: Likely anemia of chronic disease due to CKD. Retic count, ferritin unremarkable. CKD - monitor with BMPs Depression: Continue Lexapro  20 mg daily  FEN/GI: Renal diet PPx: Lovenox  Dispo:Home pending PT/OT recs.  Subjective:  No overnight events.  Today, pt reports no palpitations, chest pain, dyspnea. No lightheadedness, dizziness, headache. She does report some neck pain, feeling cold.  Objective: Temp:  [97.9 F (36.6 C)-98.3 F (36.8 C)] 97.9 F (36.6 C) (07/06 0433) Pulse Rate:  [44-50] 50 (07/06 0433) Resp:  [16-17] 16 (07/06 0433) BP: (132-187)/(41-65) 170/65 (07/06 0500) SpO2:  [94 %] 94 % (07/06 0433) Physical Exam: General: Pt is lying down in bed, no acute distress. Cardiovascular: Regular rate and rhythm, no murmurs/rubs/gallops. Respiratory: Normal work of breathing on room air. Clear to auscultation bilaterally; no wheezes, crackles. Abdomen: Bowel sounds present and normoactive bilaterally. Soft, nondistended, nontender. Extremities: Skin warm, dry. None to minimal bilateral lower extremity edema.  Laboratory: Most recent CBC Lab Results  Component Value Date   WBC 6.8 12/01/2023   HGB 9.9 (L) 12/01/2023    HCT 31.3 (L) 12/01/2023   MCV 83.7 12/01/2023   PLT 209 12/01/2023   Most recent BMP    Latest Ref Rng & Units 12/03/2023    7:33 AM  BMP  Glucose 70 - 99 mg/dL 863   BUN 8 - 23 mg/dL 36   Creatinine 9.55 - 1.00 mg/dL 7.90   Sodium 864 - 854 mmol/L 137   Potassium 3.5 - 5.1 mmol/L 5.4   Chloride 98 - 111 mmol/L 103   CO2 22 - 32 mmol/L 21   Calcium  8.9 - 10.3 mg/dL 8.9    AM cortisol: 84.7 Aldosterone + renin: pending   Lauren Palma, MD 12/03/2023, 9:08 AM  PGY-1, [place] [facility] FPTS Intern pager: [phone], text pages welcome Secure chat group [facility] Teaching Service

## 2023-12-03 NOTE — Assessment & Plan Note (Signed)
 Baseline Cr around 2. Cr of 2.09 today; giving fluid bolus and nephro consulted. - Bolus with 250 mL NS - Appreciate nephro recs

## 2023-12-03 NOTE — Assessment & Plan Note (Addendum)
 HLD  PVD: Continue Ezetimibe  10 mg tablet, Rosuvastatin  10 mg tablet, Aspirin  81mg  T2DM: Last A1c 6.6, no chronic medications. Normocytic anemia: Likely anemia of chronic disease due to CKD. Retic count, ferritin unremarkable. CKD - monitor with BMPs Depression: Continue Lexapro  20 mg daily

## 2023-12-03 NOTE — Assessment & Plan Note (Addendum)
 K remains elevated at 5.4. No indication for repeat Lokelma . AM cortisol WNL (15.2), renin and aldosterone pending but not high concern for hypoaldosteronism. - Will consult nephro - Continue to monitor K with repeat BMP in AM - Appreciate nephro recs - Pt to follow up with nephro outpatient - PT/OT consult placed

## 2023-12-03 NOTE — Assessment & Plan Note (Addendum)
 HR this morning of 50. Asymptomatic. - Continue holding carvedilol  - Continuous telemetry - Consider cardiology consult if patient becomes symptomatic or if bradycardia worsens

## 2023-12-03 NOTE — Assessment & Plan Note (Addendum)
 None to minimal leg edema. Lasix  held in setting of AKI (home dose of 20 mg daily). - Continue to hold Lasix 

## 2023-12-04 ENCOUNTER — Ambulatory Visit: Payer: PPO

## 2023-12-04 DIAGNOSIS — I639 Cerebral infarction, unspecified: Secondary | ICD-10-CM

## 2023-12-04 DIAGNOSIS — E875 Hyperkalemia: Secondary | ICD-10-CM | POA: Diagnosis not present

## 2023-12-04 DIAGNOSIS — M7989 Other specified soft tissue disorders: Secondary | ICD-10-CM | POA: Diagnosis not present

## 2023-12-04 LAB — BASIC METABOLIC PANEL WITH GFR
Anion gap: 8 (ref 5–15)
Anion gap: 8 (ref 5–15)
BUN: 38 mg/dL — ABNORMAL HIGH (ref 8–23)
BUN: 40 mg/dL — ABNORMAL HIGH (ref 8–23)
CO2: 22 mmol/L (ref 22–32)
CO2: 22 mmol/L (ref 22–32)
Calcium: 8.4 mg/dL — ABNORMAL LOW (ref 8.9–10.3)
Calcium: 8.1 mg/dL — ABNORMAL LOW (ref 8.9–10.3)
Chloride: 106 mmol/L (ref 98–111)
Chloride: 107 mmol/L (ref 98–111)
Creatinine, Ser: 2.13 mg/dL — ABNORMAL HIGH (ref 0.44–1.00)
Creatinine, Ser: 2.08 mg/dL — ABNORMAL HIGH (ref 0.44–1.00)
GFR, Estimated: 23 mL/min — ABNORMAL LOW (ref >60)
GFR, Estimated: 24 mL/min — ABNORMAL LOW (ref >60)
Glucose, Bld: 139 mg/dL — ABNORMAL HIGH (ref 70–99)
Glucose, Bld: 131 mg/dL — ABNORMAL HIGH (ref 70–99)
Potassium: 5.8 mmol/L — ABNORMAL HIGH (ref 3.5–5.1)
Potassium: 5.2 mmol/L — ABNORMAL HIGH (ref 3.5–5.1)
Sodium: 136 mmol/L (ref 135–145)
Sodium: 137 mmol/L (ref 135–145)

## 2023-12-04 MED ORDER — SODIUM ZIRCONIUM CYCLOSILICATE 10 G PO PACK
10.0000 g | PACK | Freq: Once | ORAL | Status: AC
  Administered 2023-12-04: 10 g via ORAL
  Filled 2023-12-04 (×2): qty 1

## 2023-12-04 MED ORDER — SODIUM ZIRCONIUM CYCLOSILICATE 10 G PO PACK
10.0000 g | PACK | Freq: Every day | ORAL | Status: DC
  Administered 2023-12-05: 10 g via ORAL
  Filled 2023-12-04: qty 1

## 2023-12-04 MED ORDER — AMLODIPINE BESYLATE 10 MG PO TABS
10.0000 mg | ORAL_TABLET | Freq: Every day | ORAL | Status: DC
  Administered 2023-12-04 – 2023-12-05 (×2): 10 mg via ORAL
  Filled 2023-12-04 (×2): qty 1

## 2023-12-04 MED ORDER — SODIUM CHLORIDE 0.9 % IV BOLUS
500.0000 mL | Freq: Once | INTRAVENOUS | Status: AC
  Administered 2023-12-04: 500 mL via INTRAVENOUS

## 2023-12-04 NOTE — NC FL2 (Signed)
 [place]  MEDICAID FL2 LEVEL OF CARE FORM     IDENTIFICATION  Patient Name: Lauren Craig Birthdate: 1945-10-19 Sex: female Admission Date (Current Location): 11/30/2023  [facility] and [place] Number:  Producer, television/film/video and Address:  [facility], [address] [place], [place] [address]      Provider Number: [id number]  Attending Physician Name and Address:  Anders Otto DASEN, MD  Relative Name and Phone Number:  Cheryln, Bal[place] Daughter   [phone]    Current Level of Care: Hospital Recommended Level of Care: Skilled Nursing Facility Prior Approval Number:    Date Approved/Denied:   PASRR Number: [id number]  Discharge Plan: SNF    Current Diagnoses: Patient Active Problem List   Diagnosis Date Noted   (HFpEF) heart failure with preserved ejection fraction (HCC) 12/01/2023   Hypertension 12/01/2023   Normocytic anemia 12/01/2023   Leg swelling 12/01/2023   Bradycardia 12/01/2023   Chronic health problem 12/01/2023   Hyperkalemia 11/30/2023   CKD (chronic kidney disease) stage 4, GFR 15-29 ml/min (HCC) 11/30/2023   Goals of care, counseling/discussion 12/16/2022   Mood disorder (HCC) 11/09/2022   ICH (intracerebral hemorrhage) (HCC) 05/15/2022   Chronic Nausea, Vomiting, Intermittent Diarrhea 04/05/2022   Senile purpura (HCC) 02/23/2022   Memory changes 02/23/2022   CKD (chronic kidney disease) 08/27/2021   Arthritis 03/15/2021   Esophagitis 03/15/2021   Renal cyst 12/17/2020   Visual field defect 11/08/2020   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) 11/08/2020   Acute renal failure superimposed on chronic kidney disease (HCC) 10/28/2020   HTN (hypertension) Difficult to Control with Isolated Systolic HTN  10/03/2017   Controlled diabetes mellitus type 2 with complications (HCC) 10/03/2017   PVD (peripheral vascular disease) (HCC) 2010   Vitamin B12 deficiency 2002    Orientation  RESPIRATION BLADDER Height & Weight     Self, Time, Situation, Place  Normal Incontinent Weight: 162 lb 11.2 oz (73.8 kg) Height:  5' 4 (162.6 cm)  BEHAVIORAL SYMPTOMS/MOOD NEUROLOGICAL BOWEL NUTRITION STATUS      Continent Diet (see discharge summary)  AMBULATORY STATUS COMMUNICATION OF NEEDS Skin   Limited Assist Verbally Normal                       Personal Care Assistance Level of Assistance  Bathing, Feeding, Dressing Bathing Assistance: Limited assistance Feeding assistance: Independent Dressing Assistance: Limited assistance     Functional Limitations Info  Sight, Hearing, Speech Sight Info: Adequate Hearing Info: Adequate Speech Info: Adequate    SPECIAL CARE FACTORS FREQUENCY  PT (By licensed PT), OT (By licensed OT)     PT Frequency: 5x week OT Frequency: 5x week            Contractures Contractures Info: Not present    Additional Factors Info  Code Status, Allergies Code Status Info: full Allergies Info: Penicillins, Insulin  Lispro, Insulin  Lispro Prot & Lispro, Semaglutide, Metformin           Current Medications (12/04/2023):  This is the current hospital active medication list Current Facility-Administered Medications  Medication Dose Route Frequency Provider Last Rate Last Admin   acetaminophen  (TYLENOL ) tablet 650 mg  650 mg Oral Q6H PRN Tharon Lung, MD   650 mg at 12/04/23 0640   albuterol  (PROVENTIL ) (2.5 MG/3ML) 0.083% nebulizer solution 2.5 mg  2.5 mg Inhalation Q6H PRN Tharon Lung, MD   2.5 mg at 12/02/23 0542   amLODipine  (NORVASC ) tablet 10  mg  10 mg Oral Daily Nemecek, Alan, MD   10 mg at 12/04/23 1224   aspirin  EC tablet 81 mg  81 mg Oral Daily Tharon Lung, MD   81 mg at 12/04/23 0818   cloNIDine  (CATAPRES ) tablet 0.1 mg  0.1 mg Oral BID Tharon Lung, MD   0.1 mg at 12/04/23 0818   enoxaparin  (LOVENOX ) injection 30 mg  30 mg Subcutaneous Q24H Tharon Lung, MD   30 mg at 12/04/23 9180   escitalopram  (LEXAPRO ) tablet 20 mg  20 mg  Oral Daily Tharon Lung, MD   20 mg at 12/04/23 9180   ezetimibe  (ZETIA ) tablet 10 mg  10 mg Oral Daily Tharon Lung, MD   10 mg at 12/04/23 0818   famotidine  (PEPCID ) tablet 10 mg  10 mg Oral Daily Tharon Lung, MD   10 mg at 12/04/23 0818   fluticasone  (FLONASE ) 50 MCG/ACT nasal spray 1 spray  1 spray Each Nare Daily PRN Tharon Lung, MD       loratadine  (CLARITIN ) tablet 10 mg  10 mg Oral BID Tharon Lung, MD   10 mg at 12/04/23 0818   rosuvastatin  (CRESTOR ) tablet 10 mg  10 mg Oral Daily Tharon Lung, MD   10 mg at 12/04/23 0819   [START ON 12/05/2023] sodium zirconium cyclosilicate  (LOKELMA ) packet 10 g  10 g Oral Daily Shitarev, Dimitry, MD         Discharge Medications: Please see discharge summary for a list of discharge medications.  Relevant Imaging Results:  Relevant Lab Results:   Additional Information SSN: [id number]  Bridget Cordella Simmonds, LCSW

## 2023-12-04 NOTE — Assessment & Plan Note (Addendum)
 Hx of difficult-to-control SBP, medication noncompliance. Home regimen of amlodipine , clonidine , carvedilol . Holding carvedilol  in setting of bradycardia. - Continue amlodipine  10 mg daily - Continue clonidine  0.1 mg twice daily - Hold carvedilol  - Continue to monitor BP; if DBP < 40, hold vs reduce dose of amlodipine 

## 2023-12-04 NOTE — Evaluation (Signed)
 Physical Therapy Evaluation Patient Details Name: Lauren Craig MRN: [id number] DOB: 1946/04/08 Today's Date: 12/04/2023  History of Present Illness  78 Y/O F sent to hospital by PCP for hyperkalemia. PMH: of HTN, Esophagitis, HLD, PVD, CVA, DM2, L 5th toe amp, and B12 deficiency  Clinical Impression  Pt admitted with above. Pt pleasant, cooperative and was living at [facility] in [place]. Pt denies assist with ADLs, medication management, or mobility from ALF staff. Pt states she primarily uses rollator for mobility, amb to dinning hall for all 3 meals, dtr assists with medication management otherwise pt indep with dressing/bathing, and reports dtr takes her to MD appointments.  Pt currently with noted delayed response time, requiring minA for transfers and ambulation, and assist for LB dressing. Pt at increased fall risk despite use of RW. At this time recommending inpatient rehab program < 3 hrs a day to address above deficits and achieve safe level of function for safe transition back to ALF apartment. Acute PT to cont to follow.      If plan is discharge home, recommend the following: A little help with walking and/or transfers;A little help with bathing/dressing/bathroom;Assist for transportation   Can travel by private vehicle   Yes    Equipment Recommendations None recommended by PT  Recommendations for Other Services       Functional Status Assessment Patient has had a recent decline in their functional status and demonstrates the ability to make significant improvements in function in a reasonable and predictable amount of time.     Precautions / Restrictions Precautions Precautions: Fall Restrictions Weight Bearing Restrictions Per Provider Order: No      Mobility  Bed Mobility Overal bed mobility: Needs Assistance Bed Mobility: Supine to Sit     Supine to sit: Min assist, HOB elevated, Used rails     General bed mobility comments: HOB elevated, pt able to  initiate bringing LEs off EOB, required minA for trunk elevation, increased time to scoot forward to place feet on the floor    Transfers Overall transfer level: Needs assistance Equipment used: Rolling walker (2 wheels) Transfers: Sit to/from Stand Sit to Stand: Min assist           General transfer comment: minA to power up and steady during transition of hands. pt with report I'm so afraid i'm going to fall. pt with wide base of support and increased UE dependency during static standing while urinating    Ambulation/Gait Ambulation/Gait assistance: Min assist Gait Distance (Feet): 12 Feet Assistive device: Rolling walker (2 wheels) Gait Pattern/deviations: Step-to pattern, Decreased stride length, Wide base of support Gait velocity: dec Gait velocity interpretation: <1.31 ft/sec, indicative of household ambulator   General Gait Details: pt with short step height and length, minA for walker management around obstacles, decreased pace, minA to steady and manage RW during turning to sit in chair, incresaed time, 2 standing rest breaks with report I am exhausted  Careers information officer     Tilt Bed    Modified Rankin (Stroke Patients Only)       Balance Overall balance assessment: Needs assistance Sitting-balance support: Feet supported, Bilateral upper extremity supported Sitting balance-Leahy Scale: Fair Sitting balance - Comments: able to bring L foot up to R knee to attempt to don underwear with minA to steady   Standing balance support: Bilateral upper extremity supported, During functional activity, Reliant on assistive device for balance Standing balance-Leahy Scale:  Poor Standing balance comment: reliant on external support                             Pertinent Vitals/Pain Pain Assessment Pain Assessment: No/denies pain    Home Living Family/patient expects to be discharged to:: Assisted living                  Home Equipment: Rollator (4 wheels);Cane - single point;Shower seat;Other (comment) (adjustable bed) Additional Comments: pt reports living in [facility] but not receiving any services    Prior Function Prior Level of Function : Independent/Modified Independent             Mobility Comments: reports using a rollator primarily, dtr drives her to MD appts ADLs Comments: dtr assists with medication management, pt reports independence with dressing and bathing and going to dinning hall for meals     Extremity/Trunk Assessment   Upper Extremity Assessment Upper Extremity Assessment: Generalized weakness    Lower Extremity Assessment Lower Extremity Assessment: Generalized weakness    Cervical / Trunk Assessment Cervical / Trunk Assessment: Kyphotic  Communication   Communication Communication: Impaired Factors Affecting Communication: Reduced clarity of speech    Cognition Arousal: Alert Behavior During Therapy: WFL for tasks assessed/performed   PT - Cognitive impairments: No family/caregiver present to determine baseline                       PT - Cognition Comments: pt with delayed repsonse time but appropriate, oriented, and able to follow commands, pt with difficulty remembering name of ALF but eventually recalled 10 minutes later, [facility] Following commands: Intact       Cueing Cueing Techniques: Verbal cues     General Comments General comments (skin integrity, edema, etc.): pt with episode of urinary incontinence upon standing. Pt with purwick in however sub optimal fit and pt with excessive leakage of urine around purwick, pt did perform pericare in standing with unilateral UE support and set up    Exercises     Assessment/Plan    PT Assessment Patient needs continued PT services  PT Problem List Decreased strength;Decreased activity tolerance;Decreased balance;Decreased mobility       PT Treatment Interventions DME instruction;Gait  training;Functional mobility training;Therapeutic activities;Therapeutic exercise;Balance training    PT Goals (Current goals can be found in the Care Plan section)  Acute Rehab PT Goals Patient Stated Goal: get stronger PT Goal Formulation: With patient Time For Goal Achievement: 12/18/23 Potential to Achieve Goals: Good    Frequency Min 2X/week     Co-evaluation               AM-PAC PT 6 Clicks Mobility  Outcome Measure Help needed turning from your back to your side while in a flat bed without using bedrails?: A Lot Help needed moving from lying on your back to sitting on the side of a flat bed without using bedrails?: A Lot Help needed moving to and from a bed to a chair (including a wheelchair)?: A Lot Help needed standing up from a chair using your arms (e.g., wheelchair or bedside chair)?: A Little Help needed to walk in hospital room?: A Little Help needed climbing 3-5 steps with a railing? : A Lot 6 Click Score: 14    End of Session Equipment Utilized During Treatment: Gait belt Activity Tolerance: Patient limited by fatigue Patient left: in chair;with call bell/phone within reach;with chair alarm set Nurse  Communication: Mobility status (found 1 tylenol  pill in bed, urinary incontinence) PT Visit Diagnosis: Unsteadiness on feet (R26.81)    Time: 9259-9184 PT Time Calculation (min) (ACUTE ONLY): 35 min   Charges:   PT Evaluation $PT Eval Moderate Complexity: 1 Mod PT Treatments $Therapeutic Activity: 8-22 mins PT General Charges $$ ACUTE PT VISIT: 1 Visit         Norene Ames, PT, DPT Acute Rehabilitation Services Secure chat preferred Office #: [phone]   Norene CHRISTELLA Ames 12/04/2023, 8:34 AM

## 2023-12-04 NOTE — Assessment & Plan Note (Addendum)
 Euvolemic on exam. Lasix  held in setting of AKI (home dose 20 mg daily). - Continue to hold Lasix . - Pt cautioned on signs/symptoms of CHF to monitor for

## 2023-12-04 NOTE — Plan of Care (Signed)
  Problem: Clinical Measurements: Goal: Ability to maintain clinical measurements within normal limits will improve Outcome: Progressing Goal: Will remain free from infection Outcome: Progressing Goal: Diagnostic test results will improve Outcome: Progressing Goal: Respiratory complications will improve Outcome: Progressing Goal: Cardiovascular complication will be avoided Outcome: Progressing   Problem: Activity: Goal: Risk for activity intolerance will decrease Outcome: Progressing   Problem: Nutrition: Goal: Adequate nutrition will be maintained Outcome: Progressing   Problem: Coping: Goal: Level of anxiety will decrease Outcome: Progressing   Problem: Elimination: Goal: Will not experience complications related to bowel motility Outcome: Progressing Goal: Will not experience complications related to urinary retention Outcome: Completed/Met   Problem: Safety: Goal: Ability to remain free from injury will improve Outcome: Progressing   Problem: Skin Integrity: Goal: Risk for impaired skin integrity will decrease Outcome: Progressing

## 2023-12-04 NOTE — Evaluation (Signed)
 Occupational Therapy Evaluation Patient Details Name: Lauren Craig MRN: [id number] DOB: 1946/05/24 Today's Date: 12/04/2023   History of Present Illness   78 Y/O F sent to hospital by PCP for hyperkalemia. PMH: of HTN, Esophagitis, HLD, PVD, CVA, DM2, L 5th toe amp, and B12 deficiency     Clinical Impressions Pt presents with decline in function and safety with ADLs and ADL mobility with impaired strength, balance and endurance. [place] pt lives at an ALF and pt Ind with bathing, dressing, toileting.reports using a rollator primarily, dtr assists with med mgt and drives her to MD appts. Pt currently requires min A to sit EOB, CGA with UB ADLs, mod A with LB ADLs and min A sit-stand/mobility and transfers using RW min A. Pt would benefit from acute OT services to address impairments to maximize level of function and safety     If plan is discharge home, recommend the following:   A lot of help with bathing/dressing/bathroom;A little help with walking and/or transfers;Direct supervision/assist for financial management;Help with stairs or ramp for entrance     Functional Status Assessment   Patient has had a recent decline in their functional status and demonstrates the ability to make significant improvements in function in a reasonable and predictable amount of time.     Equipment Recommendations   None recommended by OT     Recommendations for Other Services         Precautions/Restrictions   Precautions Precautions: Fall Restrictions Weight Bearing Restrictions Per Provider Order: No     Mobility Bed Mobility Overal bed mobility: Needs Assistance Bed Mobility: Supine to Sit, Sit to Supine     Supine to sit: Min assist, HOB elevated, Used rails Sit to supine: Min assist   General bed mobility comments: Assist with trunk elevation and LE mgt, increased time to scoot hips at EOB    Transfers Overall transfer level: Needs assistance Equipment used: Rolling  walker (2 wheels) Transfers: Sit to/from Stand Sit to Stand: Min assist                  Balance Overall balance assessment: Needs assistance Sitting-balance support: Feet supported, Bilateral upper extremity supported Sitting balance-Leahy Scale: Fair     Standing balance support: Bilateral upper extremity supported, During functional activity, Reliant on assistive device for balance Standing balance-Leahy Scale: Poor                             ADL either performed or assessed with clinical judgement   ADL Overall ADL's : Needs assistance/impaired Eating/Feeding: Independent   Grooming: Wash/dry hands;Wash/dry face;Contact guard assist;Sitting   Upper Body Bathing: Contact guard assist;Sitting   Lower Body Bathing: Moderate assistance   Upper Body Dressing : Contact guard assist;Sitting   Lower Body Dressing: Moderate assistance   Toilet Transfer: Minimal assistance;Ambulation;Rolling walker (2 wheels);BSC/3in1;Cueing for safety   Toileting- Clothing Manipulation and Hygiene: Moderate assistance;Sit to/from stand       Functional mobility during ADLs: Moderate assistance;Cueing for safety;Rolling walker (2 wheels)       Vision Baseline Vision/History: 1 Wears glasses Ability to See in Adequate Light: 0 Adequate Patient Visual Report: No change from baseline       Perception         Praxis         Pertinent Vitals/Pain Pain Assessment Pain Assessment: No/denies pain     Extremity/Trunk Assessment Upper Extremity Assessment Upper Extremity Assessment: Generalized weakness  Lower Extremity Assessment Lower Extremity Assessment: Defer to PT evaluation   Cervical / Trunk Assessment Cervical / Trunk Assessment: Kyphotic   Communication Communication Communication: Impaired Factors Affecting Communication: Reduced clarity of speech   Cognition Arousal: Alert Behavior During Therapy: WFL for tasks assessed/performed                                  Following commands: Intact       Cueing  General Comments   Cueing Techniques: Verbal cues      Exercises     Shoulder Instructions      Home Living Family/patient expects to be discharged to:: Assisted living                             Home Equipment: Rollator (4 wheels);Cane - single point;Shower seat;Other (comment) (adjustable bed)   Additional Comments: pt reports living in [facility]      Prior Functioning/Environment Prior Level of Function : Independent/Modified Independent             Mobility Comments: reports using a rollator primarily, dtr drives her to MD appts ADLs Comments: Dtr assists with med mgt. Pt Ind with bathing, dressing, toileting    OT Problem List: Decreased strength;Decreased activity tolerance;Impaired balance (sitting and/or standing);Decreased knowledge of use of DME or AE   OT Treatment/Interventions: Self-care/ADL training;Therapeutic exercise;Balance training;Therapeutic activities;DME and/or AE instruction;Patient/family education      OT Goals(Current goals can be found in the care plan section)   Acute Rehab OT Goals Patient Stated Goal: go home OT Goal Formulation: With patient Time For Goal Achievement: 12/18/23 Potential to Achieve Goals: Good ADL Goals Pt Will Perform Grooming: with contact guard assist;with supervision;standing Pt Will Perform Upper Body Bathing: with supervision;with set-up;sitting Pt Will Perform Lower Body Bathing: with min assist;sitting/lateral leans;sit to/from stand Pt Will Perform Upper Body Dressing: with supervision;with set-up;sitting Pt Will Transfer to Toilet: with contact guard assist;with supervision;ambulating Pt Will Perform Toileting - Clothing Manipulation and hygiene: with min assist;with contact guard assist;sitting/lateral leans;sit to/from stand   OT Frequency:  Min 2X/week    Co-evaluation              AM-PAC OT 6  Clicks Daily Activity     Outcome Measure Help from another person eating meals?: None Help from another person taking care of personal grooming?: A Little Help from another person toileting, which includes using toliet, bedpan, or urinal?: A Lot Help from another person bathing (including washing, rinsing, drying)?: A Lot Help from another person to put on and taking off regular upper body clothing?: A Little Help from another person to put on and taking off regular lower body clothing?: A Lot 6 Click Score: 16   End of Session Equipment Utilized During Treatment: Gait belt;Rolling walker (2 wheels);Other (comment) [facility]) Nurse Communication: Mobility status  Activity Tolerance: Patient tolerated treatment well Patient left: in bed;with call bell/phone within reach;with bed alarm set  OT Visit Diagnosis: Other abnormalities of gait and mobility (R26.89);History of falling (Z91.81);Muscle weakness (generalized) (M62.81)                Time: 8992-8965 OT Time Calculation (min): 27 min Charges:  OT General Charges $OT Visit: 1 Visit OT Evaluation $OT Eval Moderate Complexity: 1 Mod OT Treatments $Therapeutic Activity: 8-22 mins    Jacques Karna Loose 12/04/2023, 1:49 PM

## 2023-12-04 NOTE — Assessment & Plan Note (Signed)
 Pt with ILR in place, follows with cards outpatient. HR 45 this morning; asymptomatic. - Hold carvedilol  - Continuous telemetry - Consider cardiology consult if patient becomes symptomatic or if bradycardia worsens - Pt to follow-up with cardiology outpatient

## 2023-12-04 NOTE — Assessment & Plan Note (Addendum)
 Cr continues to elevate slightly; 2.13 today despite 250 mL bolus yesterday. Baseline around 2. - Bolus with 500 mL NS

## 2023-12-04 NOTE — Assessment & Plan Note (Signed)
 HLD  PVD: Continue Ezetimibe  10 mg tablet, Rosuvastatin  10 mg tablet, Aspirin  81mg  T2DM: Last A1c 6.6, no chronic medications. Normocytic anemia: Likely anemia of chronic disease due to CKD. Retic count, ferritin unremarkable. CKD - monitor with BMPs Depression: Continue Lexapro  20 mg daily

## 2023-12-04 NOTE — Progress Notes (Signed)
 Daily Progress Note Intern Pager: [phone]  Patient name: Lauren Craig Medical record number: [id number] Date of birth: [birth year] Age: 78 y.o. Gender: female  Primary Care Provider: Delores Suzann HERO, MD Consultants: None Code Status: FULL  Pt Overview and Major Events to Date:  -7/3 admitted   Medical Decision Making:  Lauren Craig is a 78 y.o. female admitted for hyperkalemia likely in setting of CKD4 requiring multiple doses of Lokelma . Also with bradycardia, persistent despite discontinuing carvedilol  and for which she follows with cardiology outpatient.   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  Assessment & Plan Hyperkalemia Renal US  without hydronephrosis; no indication for inpatient nephro consult. K of 5.8 overnight; given Lokelma  10g once. Per nephrology, Lokelma  for K levels 5.5 and above while pt is inpatient; at discharge, can start pt on Lokelma  10g daily for a week. - Aldosterone + renin pending - Repeat BMP at 1400 - Start Lokelma  10 g daily for a week beginning tomorrow 7/8 - PT/OT consult - Pt to follow-up with nephro outpatient Bradycardia Pt with ILR in place, follows with cards outpatient. HR 45 this morning; asymptomatic. - Hold carvedilol  - Continuous telemetry - Consider cardiology consult if patient becomes symptomatic or if bradycardia worsens - Pt to follow-up with cardiology outpatient CKD (chronic kidney disease) stage 4, GFR 15-29 ml/min (HCC) Acute renal failure superimposed on chronic kidney disease (HCC) Cr continues to elevate slightly; 2.13 today despite 250 mL bolus yesterday. Baseline around 2. - Bolus with 500 mL NS (HFpEF) heart failure with preserved ejection fraction (HCC) Leg swelling Euvolemic on exam. Lasix  held in setting of AKI (home dose 20 mg daily). - Continue to hold Lasix . - Pt cautioned on signs/symptoms of CHF to monitor  for Hypertension Hx of difficult-to-control SBP, medication noncompliance. Home regimen of amlodipine , clonidine , carvedilol . Holding carvedilol  in setting of bradycardia. - Continue amlodipine  10 mg daily - Continue clonidine  0.1 mg twice daily - Hold carvedilol  - Continue to monitor BP; if DBP < 40, hold vs reduce dose of amlodipine  Chronic health problem HLD  PVD: Continue Ezetimibe  10 mg tablet, Rosuvastatin  10 mg tablet, Aspirin  81mg  T2DM: Last A1c 6.6, no chronic medications. Normocytic anemia: Likely anemia of chronic disease due to CKD. Retic count, ferritin unremarkable. CKD - monitor with BMPs Depression: Continue Lexapro  20 mg daily   FEN/GI: Renal diet PPx: Lovenox  Dispo:Home today pending PT/OT recs  Subjective:  Overnight, pt with low K and supplemented with Lokelma .   Today, pt reports she is feeling well with no concerns.  Objective: Temp:  [97.5 F (36.4 C)-98.3 F (36.8 C)] 98.3 F (36.8 C) (07/07 0451) Pulse Rate:  [43-48] 45 (07/07 0451) Resp:  [14-17] 17 (07/07 0451) BP: (160-182)/(46-91) 168/46 (07/07 0451) SpO2:  [92 %-98 %] 94 % (07/07 0451) Physical Exam: General: Pt sitting in recliner eating breakfast, no acute distress Cardiovascular: Bradycardic, regular rhythm. No murmurs, rubs, gallops. Respiratory: Normal work of breathing on room air. Clear to auscultation bilaterally; no wheezes, crackles. Abdomen: Bowel sounds present and normoactive bilaterally. Soft, nondistended, nontender. Extremities: Skin warm, dry. No BLE edema. Neuro: Alert and oriented to person, place, time. No focal neurological deficits. Psych: Appropriate affect.  Laboratory: Most recent CBC Lab Results  Component Value Date   WBC 6.8 12/01/2023   HGB 9.9 (L) 12/01/2023   HCT 31.3 (L) 12/01/2023   MCV 83.7 12/01/2023   PLT 209 12/01/2023  Most recent BMP    Latest Ref Rng & Units 12/04/2023    3:27 AM  BMP  Glucose 70 - 99 mg/dL 860   BUN 8 - 23 mg/dL 38    Creatinine 9.55 - 1.00 mg/dL 7.86   Sodium 864 - 854 mmol/L 136   Potassium 3.5 - 5.1 mmol/L 5.8   Chloride 98 - 111 mmol/L 106   CO2 22 - 32 mmol/L 22   Calcium  8.9 - 10.3 mg/dL 8.4     Imaging/Diagnostic Tests:  Renal US  7/6: IMPRESSION: Possible small nonobstructive bilateral calculi. No hydronephrosis in either kidney.  [name], MD 12/04/2023, 7:14 AM  PGY-1, [facility] FPTS Intern pager: [phone], text pages welcome Secure chat group [facility] Teaching Service

## 2023-12-04 NOTE — TOC Initial Note (Addendum)
 Transition of Care [facility]) - Initial/Assessment Note    Patient Details  Name: Lauren Craig MRN: [id number] Date of Birth: [birth year]  Transition of Care [facility]) CM/SW Contact:    Bridget Cordella Simmonds, LCSW Phone Number: 12/04/2023, 3:35 PM  Clinical Narrative:       CSW met with pt regarding PT recommendation for SNF.  Pt reports she is from AL[place], per [facility] [place]  Pt states she can get her PT there, does not want to go to facility.  Permission given to speak with her daughter Samule.    CSW spoke with [facility] by phone.  [facility] where pt has independent living apartment.  No rehab available.  Samule will speak with her mother about SNF and call back.  Samule is in [place] and would prefer SNF here.  Medicare choice document emailed to [name]: [email or web address].     Referral not sent out in hub.     1600: Message from daughter Samule: she spoke with [employer] and they use [facility] services.  Samule and her mother would like to decline SNF and arrange [facility] instead.  RNCM notified.        Expected Discharge Plan: Skilled Nursing Facility Barriers to Discharge: Continued Medical Work up, SNF Pending bed offer   Patient Goals and CMS Choice Patient states their goals for this hospitalization and ongoing recovery are:: get home CMS Medicare.gov Compare Post Acute Care list provided to:: Patient Represenative (must comment) (daughter Samule) Choice offered to / list presented to : Adult Children      Expected Discharge Plan and Services In-house Referral: Clinical Social Work     Living arrangements for the past 2 months: Independent Living Facility ([facility])                                      Prior Living Arrangements/Services Living arrangements for the past 2 months: Independent Living Facility ([facility]) Lives with:: Self Patient language  and need for interpreter reviewed:: Yes Do you feel safe going back to the place where you live?: Yes      Need for Family Participation in Patient Care: Yes (Comment) Care giver support system in place?: Yes (comment) Current home services: Other (comment) (none) Criminal Activity/Legal Involvement Pertinent to Current Situation/Hospitalization: No - Comment as needed  Activities of Daily Living   ADL Screening (condition at time of admission) Independently performs ADLs?: Yes (appropriate for developmental age) Is the patient deaf or have difficulty hearing?: No Does the patient have difficulty seeing, even when wearing glasses/contacts?: No Does the patient have difficulty concentrating, remembering, or making decisions?: No  Permission Sought/Granted Permission sought to share information with : Family Supports Permission granted to share information with : Yes, Verbal Permission Granted  Share Information with NAME: daughter Samule           Emotional Assessment Appearance:: Appears stated age Attitude/Demeanor/Rapport: Engaged Affect (typically observed): Appropriate Orientation: : Oriented to Self, Oriented to Place, Oriented to  Time, Oriented to Situation      Admission diagnosis:  Hyperkalemia [E87.5] Dyspnea on exertion [R06.09] Leg swelling [M79.89] Patient Active Problem List   Diagnosis Date Noted   (HFpEF) heart failure with preserved ejection fraction (HCC) 12/01/2023   Hypertension 12/01/2023   Normocytic anemia 12/01/2023   Leg swelling 12/01/2023   Bradycardia 12/01/2023   Chronic  health problem 12/01/2023   Hyperkalemia 11/30/2023   CKD (chronic kidney disease) stage 4, GFR 15-29 ml/min (HCC) 11/30/2023   Goals of care, counseling/discussion 12/16/2022   Mood disorder (HCC) 11/09/2022   ICH (intracerebral hemorrhage) (HCC) 05/15/2022   Chronic Nausea, Vomiting, Intermittent Diarrhea 04/05/2022   Senile purpura (HCC) 02/23/2022   Memory changes  02/23/2022   CKD (chronic kidney disease) 08/27/2021   Arthritis 03/15/2021   Esophagitis 03/15/2021   Renal cyst 12/17/2020   Visual field defect 11/08/2020   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) 11/08/2020   Acute renal failure superimposed on chronic kidney disease (HCC) 10/28/2020   HTN (hypertension) Difficult to Control with Isolated Systolic HTN  10/03/2017   Controlled diabetes mellitus type 2 with complications (HCC) 10/03/2017   PVD (peripheral vascular disease) (HCC) 2010   Vitamin B12 deficiency 2002   PCP:  Delores Suzann HERO, MD Pharmacy:   [phone] [place] - [address] AT [address] [place] [address] Phone: [phone] Fax: [phone]  [id number] [place] - [address] AT [facility] [address] [place] [address] Phone: [phone] Fax: [phone]     Social Drivers of Health (SDOH) Social History: SDOH Screenings   Food Insecurity: No Food Insecurity (12/01/2023)  Housing: Unknown (12/01/2023)  Transportation Needs: No Transportation Needs (12/01/2023)  Utilities: Not At Risk (12/01/2023)  Alcohol Screen: Low Risk  (05/29/2023)  Depression (PHQ2-9): Low Risk  (11/27/2023)  Financial Resource Strain: Low Risk  (05/29/2023)  Physical Activity: Inactive (05/29/2023)  Social Connections: Moderately Isolated (12/01/2023)  Stress: Stress Concern Present (02/15/2021)  Tobacco Use: Medium Risk (12/01/2023)  Health Literacy: Adequate Health Literacy (05/29/2023)   SDOH Interventions:     Readmission Risk Interventions     No data to display

## 2023-12-04 NOTE — Assessment & Plan Note (Addendum)
 Renal US  without hydronephrosis; no indication for inpatient nephro consult. K of 5.8 overnight; given Lokelma  10g once. Per nephrology, Lokelma  for K levels 5.5 and above while pt is inpatient; at discharge, can start pt on Lokelma  10g daily for a week. - Aldosterone + renin pending - Repeat BMP at 1400 - Start Lokelma  10 g daily for a week beginning tomorrow 7/8 - PT/OT consult - Pt to follow-up with nephro outpatient

## 2023-12-05 ENCOUNTER — Other Ambulatory Visit: Payer: Self-pay

## 2023-12-05 ENCOUNTER — Ambulatory Visit: Payer: Self-pay | Admitting: Family Medicine

## 2023-12-05 ENCOUNTER — Other Ambulatory Visit (HOSPITAL_COMMUNITY): Payer: Self-pay

## 2023-12-05 DIAGNOSIS — M7989 Other specified soft tissue disorders: Secondary | ICD-10-CM | POA: Diagnosis not present

## 2023-12-05 DIAGNOSIS — E875 Hyperkalemia: Secondary | ICD-10-CM | POA: Diagnosis not present

## 2023-12-05 DIAGNOSIS — R600 Localized edema: Secondary | ICD-10-CM

## 2023-12-05 DIAGNOSIS — N184 Chronic kidney disease, stage 4 (severe): Secondary | ICD-10-CM

## 2023-12-05 LAB — BASIC METABOLIC PANEL WITH GFR
Anion gap: 8 (ref 5–15)
BUN: 41 mg/dL — ABNORMAL HIGH (ref 8–23)
CO2: 18 mmol/L — ABNORMAL LOW (ref 22–32)
Calcium: 8.4 mg/dL — ABNORMAL LOW (ref 8.9–10.3)
Chloride: 109 mmol/L (ref 98–111)
Creatinine, Ser: 2.17 mg/dL — ABNORMAL HIGH (ref 0.44–1.00)
GFR, Estimated: 23 mL/min — ABNORMAL LOW (ref >60)
Glucose, Bld: 137 mg/dL — ABNORMAL HIGH (ref 70–99)
Potassium: 5.4 mmol/L — ABNORMAL HIGH (ref 3.5–5.1)
Sodium: 135 mmol/L (ref 135–145)

## 2023-12-05 MED ORDER — AMLODIPINE BESYLATE 10 MG PO TABS
10.0000 mg | ORAL_TABLET | Freq: Every day | ORAL | 0 refills | Status: DC
  Filled 2023-12-05 (×2): qty 30, 30d supply, fill #0

## 2023-12-05 MED ORDER — SODIUM ZIRCONIUM CYCLOSILICATE 10 G PO PACK
10.0000 g | PACK | Freq: Every day | ORAL | 0 refills | Status: DC
  Filled 2023-12-05 (×2): qty 7, 7d supply, fill #0

## 2023-12-05 NOTE — Progress Notes (Signed)
 Spoke with patient's daughter, Samule.  She will be here with patient's clothes in approximately 1 to 1.5 hours.  Mother is HOH and daughter will need to be here for patient's discharge instructions.  Suzen Ice RN

## 2023-12-05 NOTE — Care Management Important Message (Signed)
 Important Message  Patient Details  Name: Lauren Craig MRN: [id number] Date of Birth: May 26, 1946   Important Message Given:  Yes - Medicare IM     Claretta Deed 12/05/2023, 4:02 PM

## 2023-12-05 NOTE — Progress Notes (Signed)
   12/05/23 1112  Mobility  Activity Ambulated with assistance in room  Level of Assistance Contact guard assist, steadying assist  Assistive Device Front wheel walker  Distance Ambulated (ft) 40 ft ((40ft Forward, 45ft Backward) x4)  Activity Response Tolerated fair  Mobility Referral Yes  Mobility visit 1 Mobility  Mobility Specialist Start Time (ACUTE ONLY) 1112  Mobility Specialist Stop Time (ACUTE ONLY) 1131  Mobility Specialist Time Calculation (min) (ACUTE ONLY) 19 min   Mobility Specialist: Progress Note  Pt agreeable to mobility session - received in . C/o urinary incontinent upon STS. Returned to bed with all needs met - call bell within reach. Chair alarm on.   Virgle Boards, BS Mobility Specialist Please contact via SecureChat or  Rehab office at [phone].

## 2023-12-05 NOTE — Progress Notes (Signed)
   She felt well this morning. I again discussed the recommendation for her to go to the SNF with her, but she declined. She said she makes her own decisions and would prefer to return to ALF, where she can get all the resources she needs. She is urinating well with no difficulty.  Exam: Gen: No distress. Calm in bed. No dehydration Heart: S1 S2 normal. RRR Lungs: CTA B/L Abd: Soft, NT/ND, normoactive BS Ext: No edema  A/P: Hyperkalemia: K+ at 5.2 today Will initiate daily Lokelma  x 1 week PCP f/u for reassessment  Cortisol level borderline F/U renin-aldosterone ratio  CKD IV: Upon record review, she seems to be close to baseline of her renal function Nephrology was consulted, and they didn't recommend further inpatient intervention Renal US  reviewed Outpatient monitoring with PCP and nephrology is recommended Keep well hydrated and avoid nephrotoxic agents Continue to hold home Lasix  pending PCP re-evaluation  Bradycardia: Asymptomatic Beta-blocker held F/U with PCP and cardiology for reassessment  HTN: Lasix  and Beta-blocker held Continue home Clonidine  and Norvasc  Difficult to adjust meds due to wide pulse pressure Monitor BP closely with PCP  Stable at discharge

## 2023-12-05 NOTE — TOC Transition Note (Signed)
 Transition of Care [facility]) - Discharge Note   Patient Details  Name: Lauren Craig MRN: [id number] Date of Birth: 05-13-1946  Transition of Care [facility]) CM/SW Contact:  Tom-Johnson, Harvest Muskrat, RN Phone Number: 12/05/2023, 12:31 PM   Clinical Narrative:     Patient is scheduled for discharge today.  Readmission Risk Assessment done. Home health info, hospital f/u and discharge instructions on AVS. Prescriptions sent to [facility] pharmacy and patient will receive meds prior discharge. Daughter, Samule to transport at discharge.  No further TOC needs noted.         Final next level of care: Home w Home Health Services Barriers to Discharge: Barriers Resolved   Patient Goals and CMS Choice Patient states their goals for this hospitalization and ongoing recovery are:: To return home CMS Medicare.gov Compare Post Acute Care list provided to:: Patient Choice offered to / list presented to : Patient, Adult Children (Daughter [facility])      Discharge Placement                Patient to be transferred to facility by: Daughter Name of family member notified: [name]    Discharge Plan and Services Additional resources added to the After Visit Summary for   In-house Referral: Clinical Social Work              DME Arranged: N/A DME Agency: NA       HH Arranged: PT, OT HH Agency: [facility] Date [facility] Agency Contacted: 12/05/23 Time HH Agency Contacted: 1100 Representative spoke with at [facility] Agency: Darleene  Social Drivers of Health (SDOH) Interventions SDOH Screenings   Food Insecurity: No Food Insecurity (12/01/2023)  Housing: Unknown (12/01/2023)  Transportation Needs: No Transportation Needs (12/01/2023)  Utilities: Not At Risk (12/01/2023)  Alcohol Screen: Low Risk  (05/29/2023)  Depression (PHQ2-9): Low Risk  (11/27/2023)  Financial Resource Strain: Low Risk  (05/29/2023)  Physical Activity: Inactive (05/29/2023)  Social Connections: Moderately Isolated  (12/01/2023)  Stress: Stress Concern Present (02/15/2021)  Tobacco Use: Medium Risk (12/01/2023)  Health Literacy: Adequate Health Literacy (05/29/2023)     Readmission Risk Interventions    12/05/2023   12:21 PM  Readmission Risk Prevention Plan  Transportation Screening Complete  PCP or Specialist Appt within 5-7 Days Complete  Home Care Screening Complete  Medication Review (RN CM) Referral to Pharmacy

## 2023-12-05 NOTE — Progress Notes (Signed)
 Left message on patient's daughter's cell phone - advising that patient is ready for discharge and that [facility] had been set up.  I asked that she return my call to confirm.  Per earlier conversation with daughter, she will pick her mother up to take her to her facility and she would also bring her clothes.  Suzen Ice RN

## 2023-12-05 NOTE — Discharge Summary (Cosign Needed Addendum)
 Family Medicine Teaching [facility] Discharge Summary  Patient name: Lauren Craig Medical record number: [id number] Date of birth: 1946-05-28 Age: 78 y.o. Gender: female Date of Admission: 11/30/2023  Date of Discharge: 12/05/2023 Admitting Physician: Leafy Scriver, DO  Primary Care Provider: Delores Suzann HERO, MD Consultants: Nephrology (phone call only)  Indication for Hospitalization: Hyperkalemia  Discharge Diagnoses/Problem List:  Principal Problem for Admission: Hyperkalemia Other Problems addressed during stay:  Principal Problem:   Hyperkalemia Active Problems:   CKD (chronic kidney disease) stage 4, GFR 15-29 ml/min (HCC)   Chronic (HFpEF) heart failure with preserved ejection fraction (HCC)   Hypertension   Normocytic anemia   Bradycardia   HLD  Brief Hospital Course:  Lauren Craig is a 78 y.o. year old with a history of CKD4, HFpEF, HTN, normocytic anemia, HLD, PVD, T2DM who presented with asymptomatic hyperkalemia and was admitted to the [facility] Medicine Teaching Service for hyperkalemia.  Hyperkalemia Patient presented with K of 6.4.  EKG unremarkable.  Hyperkalemia suspected to be secondary to CKD4.  Required Lokelma  intermittently throughout stay.  K was 5.4 at time of discharge.  Per nephrology consult with Dr. Dolan, patient discharged on Lokelma  10 g daily for a week (7/8-7/14) and has PCP appointment 7/10 to monitor K.  CKD4 GFR in 20s with baseline Cr ~2. Patient did not require IV fluids during hospitalization and was maintained on oral fluids.  Per Nephrology consult with Dr. Dolan, kidney US  ordered which was overall unremarkable except possible small nonobstructive bilateral calculi.  Dr. Dolan recommended follow-up with [facility] outpatient.  Bradycardia Implantable loop recorder in place.  HR in 40s-50s throughout hospitalization, home carvedilol  initially halved on admission.  HR 39 bpm at lowest and carvedilol  was held with no  further readings in 30s.  Carvedilol  not resumed at discharge.  Hypertension Hx of difficult-to-control SBP with very wide pulse pressure; on home amlodipine , clonidine , carvedilol .  Admission BP ~210/60.  Carvedilol  was held in setting of bradycardia; amlodipine  gradually titrated up to 10 mg daily and clonidine  continued at 0.1 mg daily.  BP improving at discharge, but still with elevated BP, low DP, and wide pulse pressure.  Other chronic conditions were medically managed with home medications and formulary alternatives as necessary (HLD/PVD, T2DM, anemia, HFpEF).  PCP Follow-up Recommendations: Recheck K, extend duration of Lokelma  if appropriate response, per Nephro would generally consider for K>5.5 Follow-up bradycardia off carvedilol  Evaluate BP medications; consider long-term hydralazine  for blood pressure control given her renal disease Follow up with [facility] recommended Ensure follow-up with Electrophysiology for implantable loop recorder   Results/Tests Pending at Time of Discharge:  Unresulted Labs (From admission, onward)     Start     Ordered   12/03/23 0711  Aldosterone + renin activity w/ ratio  ONCE - URGENT,   URGENT       Question:  Specimen collection method  Answer:  Lab=Lab collect   12/03/23 0710           Disposition: Home with home health [facility])  Discharge Condition: Stable  Discharge Exam:  Vitals:   12/05/23 0730 12/05/23 1147  BP: (!) 184/43 (!) 169/45  Pulse: (!) 46 (!) 47  Resp: 20   Temp: 98.1 F (36.7 C) 97.8 F (36.6 C)  SpO2: 96% 95%   Physical Exam General: Pt sitting up in bed, no acute distress. Card: Bradycardic, regular rhythm. No murmurs, rubs, gallops. Resp: Normal work of breathing on room air. Lungs without crackles, wheezes in anterior fields.  Abd: Bowel sounds present and normoactive. Soft, nondistended, nontender. Ext: Skin warm, dry. No bilateral lower extremity edema. Neuro: Alert and oriented to person,  place, situation, appropriate response to simple questions, no focal neurological deficits.  Significant Procedures: None  Significant Labs and Imaging:  No results for input(s): WBC, HGB, HCT, PLT in the last 48 hours. Recent Labs  Lab 12/04/23 0327 12/04/23 1419 12/05/23 0520  NA 136 137 135  K 5.8* 5.2* 5.4*  CL 106 107 109  CO2 22 22 18*  GLUCOSE 139* 131* 137*  BUN 38* 40* 41*  CREATININE 2.13* 2.08* 2.17*  CALCIUM  8.4* 8.1* 8.4*   CXR 7/3: No active disease.  Renal US  7/6: Possible small nonobstructive bilateral calculi. No hydronephrosis in either kidney.  Discharge Medications:  Allergies as of 12/05/2023       Reactions   Penicillins Hives, Rash, Other (See Comments)   Did it involve swelling of the face/tongue/throat, SOB, or low BP? Yes Did it involve sudden or severe rash/hives, skin peeling, or any reaction on the inside of your mouth or nose? No Did you need to seek medical attention at a hospital or doctor's office? Yes When did it last happen? ? If all above answers are "NO", may proceed with cephalosporin use.   Insulin  Lispro Itching, Other (See Comments)   Site of injection was inflamed, red, painful episode   Insulin  Lispro Prot & Lispro Other (See Comments)   Site of injection was inflamed, red, painful episode Humalog Mix   Semaglutide Nausea And Vomiting   Other Reaction(s): GI Intolerance   Metformin Diarrhea        Medication List     PAUSE taking these medications    hydrocortisone  2.5 % ointment Wait to take this until your doctor or other care provider tells you to start again. Apply topically 2 (two) times daily. On red areas on legs   triamcinolone  0.025 % ointment Wait to take this until your doctor or other care provider tells you to start again. Commonly known as: KENALOG  Apply 1 Application topically 2 (two) times daily. Apply to right ankle rash What changed:  when to take this reasons to take this additional  instructions       STOP taking these medications    carvedilol  12.5 MG tablet Commonly known as: COREG    furosemide  20 MG tablet Commonly known as: LASIX    nystatin  cream Commonly known as: MYCOSTATIN        TAKE these medications    acetaminophen  325 MG tablet Commonly known as: TYLENOL  Take 2 tablets (650 mg total) by mouth every 6 (six) hours as needed for headache.   albuterol  108 (90 Base) MCG/ACT inhaler Commonly known as: VENTOLIN  HFA Inhale 2 puffs into the lungs every 6 (six) hours as needed for wheezing or shortness of breath.   amLODipine  10 MG tablet Commonly known as: NORVASC  Take 1 tablet (10 mg total) by mouth daily. What changed:  medication strength how much to take when to take this   aspirin  EC 81 MG tablet Take 1 tablet (81 mg total) by mouth daily. Swallow whole.   cloNIDine  0.1 MG tablet Commonly known as: CATAPRES  Take 1 tablet (0.1 mg total) by mouth 2 (two) times daily.   escitalopram  20 MG tablet Commonly known as: Lexapro  Take 1 tablet (20 mg total) by mouth daily.   ezetimibe  10 MG tablet Commonly known as: Zetia  Take 1 tablet (10 mg total) by mouth daily.   famotidine  10 MG  tablet Commonly known as: PEPCID  Take 1 tablet (10 mg total) by mouth daily.   fexofenadine  60 MG tablet Commonly known as: Allegra  Allergy Take 1 tablet (60 mg total) by mouth 2 (two) times daily.   fluticasone  50 MCG/ACT nasal spray Commonly known as: FLONASE  SPRAY 2 SPRAYS INTO EACH NOSTRIL EVERY DAY What changed: See the new instructions.   Gerhardt's butt cream Crea Use twice daily in skin folds What changed:  how much to take how to take this when to take this reasons to take this additional instructions   Lokelma  10 g Pack packet Generic drug: sodium zirconium cyclosilicate  Take 10 g by mouth daily. Start taking on: December 06, 2023 What changed:  medication strength when to take this reasons to take this   ondansetron  4 MG  disintegrating tablet Commonly known as: ZOFRAN -ODT Take 1 tablet (4 mg total) by mouth every 8 (eight) hours as needed for nausea or vomiting.   PREVAGEN PO Take 1 tablet by mouth daily.   rosuvastatin  10 MG tablet Commonly known as: CRESTOR  Take 1 tablet (10 mg total) by mouth daily.       Discharge Instructions: Please refer to Patient Instructions section of EMR for full details.  Patient was counseled important signs and symptoms that should prompt return to medical care, changes in medications, dietary instructions, activity restrictions, and follow up appointments.   Follow-Up Appointments:  Follow-up Information     Elicia Hamlet, MD Follow up on 12/05/2023.   Specialty: Family Medicine Why: at 3:30 PM Contact information: [address] [place] [address] [phone]         [facility] Follow up.   Specialty: Home Health Services Why: Someone will call you to schedule first home visit. Contact information: [address] [place]  [address] [phone]               [name], MD 12/05/2023, 5:44 PM PGY-1, [facility]   FMTS Upper-Level Resident Attestation I have independently interviewed and examined the patient. I have discussed the above with Dr. Larraine and agree with the documented plan. My edits for correction/addition/clarification are included above. Please see any attending notes.  Dora Simeone Toma, MD PGY-2, [facility] 12/05/2023 9:13 PM FPTS Service pager: [phone] (text pages welcome through [employer])

## 2023-12-05 NOTE — Progress Notes (Signed)
 Patient discharged to Facility with daughter via private vehicle.  Discharge instructions discussed with patient and daughter.  All questions addressed.  All personal belongings sent with patient.  Patient will stop by [facility] to pick up medications.  Suzen Ice RN

## 2023-12-06 ENCOUNTER — Telehealth: Payer: Self-pay

## 2023-12-06 NOTE — Transitions of Care (Post Inpatient/ED Visit) (Signed)
   12/06/2023  Name: Lauren Craig MRN: [id number] DOB: April 23, 1946  Today's TOC FU Call Status: Today's TOC FU Call Status:: Unsuccessful Call (1st Attempt) Unsuccessful Call (1st Attempt) Date: 12/06/23  Attempted to reach the patient regarding the most recent Inpatient/ED visit.   Per chart review patient was discharged back to independent living facility at [facility] in [place], [place]. Confirmed it is an independent living situation only, not ALF as listed in some discharge notes. Outreach to patient was unsuccessful as went straight to identified voice mail but mail box was full and RN CM was [facility] or call back number. Will try again.  Phone number for patient is [phone], other number is daughter, Samule.   DPR: Patient authorized ALL [facility] practices to verbally disclose PHI. Can speak with Samule Ester (Daughter) [phone] or Judeth Ester (Granddaughter) [phone]. Also can leave detailed VM on cell phone. [phone]   PCP HFU noted on chart review for this outreach to be scheduled for 12/07/23.   Follow Up Plan: Additional outreach attempts will be made to reach the patient to complete the Transitions of Care (Post Inpatient/ED visit) call.    Bing Edison MSN, RN RN Case Sales executive Health  VBCI-Population Health Office Hours M-F [phone] Direct Dial: [phone] Main Phone [phone]  Fax: [phone] [email or web address]

## 2023-12-07 ENCOUNTER — Telehealth: Payer: Self-pay

## 2023-12-07 ENCOUNTER — Ambulatory Visit (INDEPENDENT_AMBULATORY_CARE_PROVIDER_SITE_OTHER): Payer: Self-pay | Admitting: Family Medicine

## 2023-12-07 VITALS — BP 156/59 | HR 56 | Wt 155.8 lb

## 2023-12-07 DIAGNOSIS — Z89442 Acquired absence of left ankle: Secondary | ICD-10-CM | POA: Diagnosis not present

## 2023-12-07 DIAGNOSIS — Z9181 History of falling: Secondary | ICD-10-CM | POA: Diagnosis not present

## 2023-12-07 DIAGNOSIS — I440 Atrioventricular block, first degree: Secondary | ICD-10-CM | POA: Diagnosis not present

## 2023-12-07 DIAGNOSIS — M11262 Other chondrocalcinosis, left knee: Secondary | ICD-10-CM | POA: Diagnosis not present

## 2023-12-07 DIAGNOSIS — N179 Acute kidney failure, unspecified: Secondary | ICD-10-CM | POA: Diagnosis not present

## 2023-12-07 DIAGNOSIS — Z79899 Other long term (current) drug therapy: Secondary | ICD-10-CM | POA: Diagnosis not present

## 2023-12-07 DIAGNOSIS — G3184 Mild cognitive impairment, so stated: Secondary | ICD-10-CM | POA: Diagnosis not present

## 2023-12-07 DIAGNOSIS — M85862 Other specified disorders of bone density and structure, left lower leg: Secondary | ICD-10-CM | POA: Diagnosis not present

## 2023-12-07 DIAGNOSIS — Q6100 Congenital renal cyst, unspecified: Secondary | ICD-10-CM | POA: Diagnosis not present

## 2023-12-07 DIAGNOSIS — I10 Essential (primary) hypertension: Secondary | ICD-10-CM | POA: Diagnosis not present

## 2023-12-07 DIAGNOSIS — Z8673 Personal history of transient ischemic attack (TIA), and cerebral infarction without residual deficits: Secondary | ICD-10-CM | POA: Diagnosis not present

## 2023-12-07 DIAGNOSIS — E875 Hyperkalemia: Secondary | ICD-10-CM | POA: Diagnosis not present

## 2023-12-07 DIAGNOSIS — Z87891 Personal history of nicotine dependence: Secondary | ICD-10-CM | POA: Diagnosis not present

## 2023-12-07 DIAGNOSIS — D631 Anemia in chronic kidney disease: Secondary | ICD-10-CM | POA: Diagnosis not present

## 2023-12-07 DIAGNOSIS — E538 Deficiency of other specified B group vitamins: Secondary | ICD-10-CM | POA: Diagnosis not present

## 2023-12-07 DIAGNOSIS — N184 Chronic kidney disease, stage 4 (severe): Secondary | ICD-10-CM | POA: Diagnosis not present

## 2023-12-07 DIAGNOSIS — I872 Venous insufficiency (chronic) (peripheral): Secondary | ICD-10-CM | POA: Diagnosis not present

## 2023-12-07 DIAGNOSIS — I081 Rheumatic disorders of both mitral and tricuspid valves: Secondary | ICD-10-CM | POA: Diagnosis not present

## 2023-12-07 DIAGNOSIS — F39 Unspecified mood [affective] disorder: Secondary | ICD-10-CM | POA: Diagnosis not present

## 2023-12-07 DIAGNOSIS — Z95818 Presence of other cardiac implants and grafts: Secondary | ICD-10-CM | POA: Diagnosis not present

## 2023-12-07 DIAGNOSIS — I130 Hypertensive heart and chronic kidney disease with heart failure and stage 1 through stage 4 chronic kidney disease, or unspecified chronic kidney disease: Secondary | ICD-10-CM | POA: Diagnosis not present

## 2023-12-07 DIAGNOSIS — Z7982 Long term (current) use of aspirin: Secondary | ICD-10-CM | POA: Diagnosis not present

## 2023-12-07 DIAGNOSIS — I5032 Chronic diastolic (congestive) heart failure: Secondary | ICD-10-CM | POA: Diagnosis not present

## 2023-12-07 DIAGNOSIS — M1712 Unilateral primary osteoarthritis, left knee: Secondary | ICD-10-CM | POA: Diagnosis not present

## 2023-12-07 DIAGNOSIS — E785 Hyperlipidemia, unspecified: Secondary | ICD-10-CM | POA: Diagnosis not present

## 2023-12-07 DIAGNOSIS — K209 Esophagitis, unspecified without bleeding: Secondary | ICD-10-CM | POA: Diagnosis not present

## 2023-12-07 DIAGNOSIS — E1151 Type 2 diabetes mellitus with diabetic peripheral angiopathy without gangrene: Secondary | ICD-10-CM | POA: Diagnosis not present

## 2023-12-07 DIAGNOSIS — N3946 Mixed incontinence: Secondary | ICD-10-CM | POA: Diagnosis not present

## 2023-12-07 DIAGNOSIS — E1122 Type 2 diabetes mellitus with diabetic chronic kidney disease: Secondary | ICD-10-CM | POA: Diagnosis not present

## 2023-12-07 LAB — ALDOSTERONE + RENIN ACTIVITY W/ RATIO
ALDO / PRA Ratio: <2.4 (ref 0.0–30.0)
Aldosterone: <1 ng/dL (ref 0.0–30.0)
PRA LC/MS/MS: 0.421 ng/mL/h (ref 0.167–5.380)

## 2023-12-07 NOTE — Assessment & Plan Note (Signed)
 K 5.4 in hospital discharge on 7/8, taking Lokelma  daily as prescribed and will continue for the next week.  Asymptomatic, will repeat CMP and follow-up with PCP on 7/14. -CMP

## 2023-12-07 NOTE — Transitions of Care (Post Inpatient/ED Visit) (Signed)
   12/07/2023  Name: Lauren Craig MRN: [id number] DOB: 1946-05-11  Today's TOC FU Call Status: Today's TOC FU Call Status:: Unsuccessful Call (2nd Attempt) Unsuccessful Call (2nd Attempt) Date: 12/07/23  Attempted to reach the patient regarding the most recent Inpatient/ED visit. Noted on call/chart review that patient maybe at PCP HFU at the time of call. Will reach out again.   Follow Up Plan: Additional outreach attempts will be made to reach the patient to complete the Transitions of Care (Post Inpatient/ED visit) call.    Bing Edison MSN, RN RN Case Sales executive Health  VBCI-Population Health Office Hours M-F [phone] Direct Dial: [phone] Main Phone [phone]  Fax: [phone] [email or web address]

## 2023-12-07 NOTE — Assessment & Plan Note (Signed)
 156/59 standing BP and pulse 56, stable since discontinuing carvedilol  and otherwise asymptomatic without falls or concern for orthostasis.  Will continue off carvedilol  and recommend cardiology follow-up as scheduled.

## 2023-12-07 NOTE — Progress Notes (Addendum)
    SUBJECTIVE:   CHIEF COMPLAINT / HPI:   Hospital follow-up  Recently hospitalized at [facility] from 7/3 to 7/8 for hyperkalemia.  PCP Follow-up Recommendations: Recheck K, extend duration of Lokelma  if appropriate response, per Nephro would generally consider for K>5.5 Follow-up bradycardia off carvedilol  Evaluate BP medications; consider long-term hydralazine  for blood pressure control given her renal disease Follow up with [facility] recommended Ensure follow-up with Electrophysiology for implantable loop recorder  Has been taking Lokelma  as prescribed.  Denies any chest pain, shortness of breath or heart palpitations.  Still urinating frequently throughout the day, 5-6 times.  Planning to follow-up with  kidney.  Denies any weakness with standing or falls.  Currently lives alone but daughter provides significant support.  Seen by cardiology outpatient, recently had loop recorder evaluated in 09/2023 without issue.  Has stopped the carvedilol  since hospital discharge, doing well off of it.  PERTINENT  PMH / PSH: CKD 4  OBJECTIVE:   BP (!) 156/59 Comment: while standing  Pulse (!) 56   Wt 155 lb 12.8 oz (70.7 kg)   SpO2 95%   BMI 26.74 kg/m    General: NAD, pleasant, able to participate in exam Cardiac: Bradycardia.  Regular rhythm.  Notable systolic murmur with near absent S2. Respiratory: CTAB, normal effort, No wheezes, rales or rhonchi Abdomen: Bowel sounds present, nontender, nondistended Extremities: no edema or cyanosis. Skin: warm and dry, no rashes noted Neuro: alert, no obvious focal deficits Psych: Normal affect and mood  ASSESSMENT/PLAN:   Assessment & Plan Hyperkalemia K 5.4 in hospital discharge on 7/8, taking Lokelma  daily as prescribed and will continue for the next week.  Asymptomatic, will repeat CMP and follow-up with PCP on 7/14. -CMP Hypertension, unspecified type 156/59 standing BP and pulse 56, stable since discontinuing  carvedilol  and otherwise asymptomatic without falls or concern for orthostasis.  Will continue off carvedilol  and recommend cardiology follow-up as scheduled.    Dr. Izetta Nap, DO [facility]

## 2023-12-07 NOTE — Patient Instructions (Signed)
 It was wonderful to see you today! Thank you for choosing [facility].   Please bring ALL of your medications with you to every visit.   Today we talked about:  We are rechecking your potassium today.  Please continue to take the Lokelma  for the next week as discussed at the hospital.  If you have any development of chest pain shortness of breath or heart palpitations please return to care immediately.  Please also follow-up with the nephrologist as scheduled to continue management for your chronic kidney disease. Your blood pressure has a very wide range as we discussed and this can make it difficult to treat.  It is best if we treat it from a standing blood pressure perspective but I think continuing off your Coreg  for now is the right decision.  Please continue to monitor if you have weakness with standing or increase in falls, this could be related to your blood pressure or pulse.  Please follow-up with the cardiologist and the EP folks as scheduled.  Please follow up next week as scheduled   We are checking some labs today. If they are abnormal, I will call you. If they are normal, I will send you a MyChart message (if it is active) or a letter in the mail. If you do not hear about your labs in the next 2 weeks, please call the office.  Call the clinic at [phone] if your symptoms worsen or you have any concerns.  Please be sure to schedule follow up at the front desk before you leave today.   Izetta Nap, DO Family Medicine

## 2023-12-08 ENCOUNTER — Telehealth: Payer: Self-pay

## 2023-12-08 ENCOUNTER — Ambulatory Visit: Payer: Self-pay | Admitting: Family Medicine

## 2023-12-08 LAB — COMPREHENSIVE METABOLIC PANEL WITH GFR
ALT: 10 IU/L (ref 0–32)
AST: 9 IU/L (ref 0–40)
Albumin: 3.9 g/dL (ref 3.8–4.8)
Alkaline Phosphatase: 121 IU/L (ref 44–121)
BUN/Creatinine Ratio: 20 (ref 12–28)
BUN: 63 mg/dL — ABNORMAL HIGH (ref 8–27)
Bilirubin Total: 0.3 mg/dL (ref 0.0–1.2)
CO2: 16 mmol/L — ABNORMAL LOW (ref 20–29)
Calcium: 9 mg/dL (ref 8.7–10.3)
Chloride: 101 mmol/L (ref 96–106)
Creatinine, Ser: 3.1 mg/dL — ABNORMAL HIGH (ref 0.57–1.00)
Globulin, Total: 3.2 g/dL (ref 1.5–4.5)
Glucose: 130 mg/dL — ABNORMAL HIGH (ref 70–99)
Potassium: 5.1 mmol/L (ref 3.5–5.2)
Sodium: 136 mmol/L (ref 134–144)
Total Protein: 7.1 g/dL (ref 6.0–8.5)
eGFR: 15 mL/min/1.73 — ABNORMAL LOW (ref >59)

## 2023-12-08 NOTE — Telephone Encounter (Signed)
 Received message from Toribio ALMETA Cella requesting verbal orders for patient.   Attempted to call back to Toribio, however, he did not answer. VM was generic and unable to verify if this was secure. VM was not left.   Will try to call Toribio back at later time.   Chiquita JAYSON English, RN

## 2023-12-08 NOTE — Transitions of Care (Post Inpatient/ED Visit) (Signed)
   12/08/2023  Name: Lauren Craig MRN: [id number] DOB: 1945/07/10  Today's TOC FU Call Status: Today's TOC FU Call Status:: Unsuccessful Call (3rd Attempt) Unsuccessful Call (3rd Attempt) Date: 12/08/23  Attempted to reach the patient regarding the most recent Inpatient/ED visit.  On chart review for this outreach attempt, it was noted that patient had a PCP HFU 12/07/23.   Follow Up Plan: No further outreach attempts will be made at this time. We have been unable to contact the patient.   Bing Edison MSN, RN RN Case Sales executive Health  VBCI-Population Health Office Hours M-F [phone] Direct Dial: [phone] Main Phone [phone]  Fax: [phone] [email or web address]

## 2023-12-08 NOTE — Progress Notes (Unsigned)
    SUBJECTIVE:   CHIEF COMPLAINT: follow up  HPI:   Lauren Craig is a 78 y.o.  with history notable for HTN, CKD, and recent admission for hyperkalemia  presenting for follow up.     PERTINENT  PMH / PSH/Family/Social History : well controlled diabetes   OBJECTIVE:   There were no vitals taken for this visit.  Today's weight:  Review of prior weights: There were no vitals filed for this visit.  ***  ASSESSMENT/PLAN:   Assessment & Plan Hypertension, unspecified type  CKD (chronic kidney disease), stage IV (HCC)     Suzann Daring, MD  Family Medicine Teaching Service  [facility]

## 2023-12-11 ENCOUNTER — Telehealth: Payer: Self-pay | Admitting: Family Medicine

## 2023-12-11 ENCOUNTER — Ambulatory Visit (INDEPENDENT_AMBULATORY_CARE_PROVIDER_SITE_OTHER): Admitting: Family Medicine

## 2023-12-11 ENCOUNTER — Other Ambulatory Visit (HOSPITAL_COMMUNITY): Payer: Self-pay

## 2023-12-11 ENCOUNTER — Encounter: Payer: Self-pay | Admitting: Family Medicine

## 2023-12-11 ENCOUNTER — Telehealth: Payer: Self-pay

## 2023-12-11 ENCOUNTER — Ambulatory Visit: Payer: Self-pay | Admitting: Family Medicine

## 2023-12-11 VITALS — BP 195/74 | HR 72 | Wt 157.2 lb

## 2023-12-11 DIAGNOSIS — N184 Chronic kidney disease, stage 4 (severe): Secondary | ICD-10-CM

## 2023-12-11 DIAGNOSIS — I129 Hypertensive chronic kidney disease with stage 1 through stage 4 chronic kidney disease, or unspecified chronic kidney disease: Secondary | ICD-10-CM | POA: Diagnosis not present

## 2023-12-11 DIAGNOSIS — I5030 Unspecified diastolic (congestive) heart failure: Secondary | ICD-10-CM | POA: Diagnosis not present

## 2023-12-11 DIAGNOSIS — D631 Anemia in chronic kidney disease: Secondary | ICD-10-CM | POA: Diagnosis not present

## 2023-12-11 DIAGNOSIS — I10 Essential (primary) hypertension: Secondary | ICD-10-CM | POA: Diagnosis not present

## 2023-12-11 DIAGNOSIS — R809 Proteinuria, unspecified: Secondary | ICD-10-CM | POA: Diagnosis not present

## 2023-12-11 DIAGNOSIS — E875 Hyperkalemia: Secondary | ICD-10-CM | POA: Diagnosis not present

## 2023-12-11 DIAGNOSIS — N3946 Mixed incontinence: Secondary | ICD-10-CM

## 2023-12-11 LAB — CUP PACEART REMOTE DEVICE CHECK
Date Time Interrogation Session: 20250711150017
Implantable Pulse Generator Implant Date: 20240102
Pulse Gen Model: 5000
Pulse Gen Serial Number: 511019975

## 2023-12-11 MED ORDER — SODIUM ZIRCONIUM CYCLOSILICATE 10 G PO PACK: 10.0000 g | PACK | Freq: Every day | ORAL | 0 refills | Status: DC

## 2023-12-11 NOTE — Patient Instructions (Signed)
 It was wonderful to see you today.  Please bring ALL of your medications with you to every visit.   Today we talked about:  I will check on the incontinence supplies  Please drink 8 glasses of water  each day  I sent in the Lokelma  I will call you if we need to continue   I will check the cost of Lokelma   Please follow up in 1 months   Thank you for choosing [facility].   Please call [phone] with any questions about today's appointment.  Please be sure to schedule follow up at the front  desk before you leave today.   Suzann Daring, MD  Family Medicine

## 2023-12-11 NOTE — Telephone Encounter (Signed)
Patient has need for DME. I have ordered  incontinence supplies . I am routing note for [facility] RN Pool.   Westley Chandler, MD

## 2023-12-11 NOTE — Telephone Encounter (Signed)
 Lauren Craig [facility] PT with [facility] calls nurse line requesting verbal orders for [facility] PT as follows.   1x a week for 2 weeks  2x a week for 1 week 1x a week for 4 weeks   VO given to Lauren Craig per [facility] protocol.

## 2023-12-11 NOTE — Assessment & Plan Note (Signed)
 Not at goal Repeat BMP today Limited options for add on agents--may need Hydralazine  Has Nephrology later today--will follow up Dr. Era recommendations

## 2023-12-11 NOTE — Assessment & Plan Note (Signed)
 BMP today Has had hyperkalemia On last packet of Lokelma  which is expensive-will include pharmacy team to check options  Continue to hold Lasix  until restarted per

## 2023-12-12 ENCOUNTER — Ambulatory Visit: Payer: Self-pay | Admitting: Family Medicine

## 2023-12-12 ENCOUNTER — Encounter: Payer: Self-pay | Admitting: Family Medicine

## 2023-12-12 DIAGNOSIS — I10 Essential (primary) hypertension: Secondary | ICD-10-CM

## 2023-12-12 LAB — BASIC METABOLIC PANEL WITH GFR
BUN/Creatinine Ratio: 21 (ref 12–28)
BUN: 39 mg/dL — ABNORMAL HIGH (ref 8–27)
CO2: 15 mmol/L — ABNORMAL LOW (ref 20–29)
Calcium: 9.2 mg/dL (ref 8.7–10.3)
Chloride: 106 mmol/L (ref 96–106)
Creatinine, Ser: 1.84 mg/dL — ABNORMAL HIGH (ref 0.57–1.00)
Glucose: 127 mg/dL — ABNORMAL HIGH (ref 70–99)
Potassium: 5.1 mmol/L (ref 3.5–5.2)
Sodium: 136 mmol/L (ref 134–144)
eGFR: 28 mL/min/1.73 — ABNORMAL LOW (ref >59)

## 2023-12-12 MED ORDER — FUROSEMIDE 20 MG PO TABS: 20.0000 mg | ORAL_TABLET | Freq: Every day | ORAL | 3 refills | Status: DC | PRN

## 2023-12-12 MED ORDER — SODIUM ZIRCONIUM CYCLOSILICATE 10 G PO PACK: 10.0000 g | PACK | Freq: Every day | ORAL | 3 refills | Status: DC

## 2023-12-12 NOTE — Telephone Encounter (Signed)
 Attempted to return call to [facility]. No answer.   VM was generic and unable to verify if this was secure. VM was not left.    Will try to call Toribio back at later time.

## 2023-12-12 NOTE — Telephone Encounter (Signed)
 Community message sent to [employer] for processing.

## 2023-12-12 NOTE — Telephone Encounter (Signed)
 Called with results. Discussed with Dr. Norine via secure chat Rx Lokelma  daily Restarted PRN lasix  20 mg   Samule will call if Lokelma  too expensive.  Suzann Daring, MD  Family Medicine Teaching Service

## 2023-12-13 ENCOUNTER — Other Ambulatory Visit (HOSPITAL_COMMUNITY): Payer: Self-pay

## 2023-12-13 ENCOUNTER — Telehealth: Payer: Self-pay

## 2023-12-13 NOTE — Telephone Encounter (Signed)
 Toribio [facility] PT with [facility] calls nurse line to report abnormal blood pressure.   He reports on arrival, prior to starting PT, patients BP was 204/78. He reports he took this twice in both arms.   He reports she is well appearing. He reports she denies any headaches, vision changes, shortness of breath or chest pains.   He reports she had a few lone pills in her morning pill box. He reports he had her take them, he is unsure if one was her blood pressure medicine.   He reports he plans to continue with PT and reports once she is settled he will recheck her blood pressure and give us  an update.   Discussed precautions for ED visit.   Will forward to PCP.

## 2023-12-13 NOTE — Telephone Encounter (Signed)
 Thanks for the update--can Hedda do an RN visit for a medication reconciliation?  Suzann Daring, MD  Family Medicine Teaching Service

## 2023-12-13 NOTE — Telephone Encounter (Signed)
 VO given for RN visit.

## 2023-12-13 NOTE — Progress Notes (Signed)
 Pharmacy Medication Assistance Program Note    12/13/2023  Patient ID: Lauren Craig, female   DOB: 1945/11/15, 78 y.o.   MRN: [id number]     12/13/2023  Outreach Medication One  Manufacturer Medication One [employer]  [employer] Drugs Other Music therapist Drug  Type of Audiological scientist Items Requested Application   LOKELMA   NEW - MAILED TO PATIENT HOME

## 2023-12-15 ENCOUNTER — Telehealth: Payer: Self-pay

## 2023-12-15 NOTE — Telephone Encounter (Signed)
 3rd attempt to reach [facility].   He did not answer, unable to LVM as VM is unidentifiable, unsure of security.   Chiquita JAYSON English, RN

## 2023-12-15 NOTE — Telephone Encounter (Signed)
 Received call from [place] home health.   She reports that patient had elevated BP readings today. Initial reading 184/78, repeat 192/80.  Patient is asymptomatic. RN states that initially patient was unsure if she took medication today, however, Friday pill box was empty. RN is unsure if patient is taking medications appropriately, possibly confusing days.   RN is going to contact daughter regarding the dates that pill box is filled.   Also requested verbal orders for skilled nursing one time per week for 4 weeks. Verbal orders provided per protocol.   Will route to PCP regarding continued elevated BP readings.   Chiquita JAYSON English, RN

## 2023-12-15 NOTE — Telephone Encounter (Signed)
 Was to mail to patients daughter Lametria Klunk. Will contact daughter and resend application.

## 2023-12-18 NOTE — Telephone Encounter (Signed)
 Received call from Toribio regarding continued elevated BP. Reports that BP at today's visit was 188/64 with repeat of 190/64.  Patient is asymptomatic.   Advised that I would forward message to PCP. He is also asking if BP parameters should be updated for provider notification. Currently, they will need to call our office anytime the patient has a BP of greater that 140/95.  Please advise.   Chiquita JAYSON English, RN

## 2023-12-18 NOTE — Telephone Encounter (Signed)
 Please have them call for systolic >180 or diastolic >100 OR systolic <90 or diastolic <60.  Thanks Suzann Daring, MD  Family Medicine Teaching Service

## 2023-12-19 NOTE — Telephone Encounter (Signed)
 [facility] and provided with updated BP parameters.   Chiquita JAYSON English, RN

## 2023-12-19 NOTE — Progress Notes (Signed)
 Carelink Summary Report / Loop Recorder

## 2023-12-20 ENCOUNTER — Telehealth: Payer: Self-pay

## 2023-12-20 NOTE — Telephone Encounter (Signed)
 BP noted--- when they call next, can you please ask if they have completed medication review? Suzann Daring, MD  Family Medicine Teaching Service

## 2023-12-20 NOTE — Telephone Encounter (Signed)
 Paralee [facility] RN calls nurse line to report abnormal blood pressure.   She reports initial take 220/74. She reports the patient had not taken her BP medication yet.   After 30 minutes of taking BP medication her BP came down to 190/86.  She reports the patient is asymptomatic.   She is requesting a VO for social work to assess for higher level of care needs.   VO given for social work.

## 2023-12-29 ENCOUNTER — Encounter (INDEPENDENT_AMBULATORY_CARE_PROVIDER_SITE_OTHER): Payer: Self-pay

## 2023-12-30 ENCOUNTER — Ambulatory Visit: Payer: Self-pay | Admitting: Cardiology

## 2024-01-03 ENCOUNTER — Telehealth: Payer: Self-pay

## 2024-01-03 NOTE — Telephone Encounter (Signed)
 Noted--patient is no longer on carvedilol . She does need an updated TSH. Please call and help schedule visit. We can also see if home health agency can draw labs. Office visit would be preferred.   Thanks! CB

## 2024-01-03 NOTE — Telephone Encounter (Signed)
 Received call from Toribio, PT regarding patient.   He states that at today's visit, resting heart rate was 41. States that she was asymptomatic.   Prior to PT leaving HR had increased to 59-60 with ambulation.   Called patient's daughter. Advised of lower resting heart rate. She is going to reach out to Cardiology regarding concern as well.   I attempted to also reach patient at number provided. She did not answer.   Called daughter back. She states that she spoke with cardiology office and they are going to be checking in with patient tomorrow regarding loop recorder.   Samule states that she spoke with mother earlier today and she was doing well with no concerns. Advised of ED precautions.   Chiquita JAYSON English, RN

## 2024-01-04 ENCOUNTER — Ambulatory Visit: Payer: PPO

## 2024-01-04 ENCOUNTER — Ambulatory Visit: Payer: Self-pay | Admitting: Cardiology

## 2024-01-04 DIAGNOSIS — I639 Cerebral infarction, unspecified: Secondary | ICD-10-CM | POA: Diagnosis not present

## 2024-01-04 LAB — CUP PACEART REMOTE DEVICE CHECK
Date Time Interrogation Session: 20250807050404
Implantable Pulse Generator Implant Date: 20240102
Pulse Gen Model: 5000
Pulse Gen Serial Number: 511019975

## 2024-01-04 NOTE — Telephone Encounter (Signed)
 Called daughter and scheduled visit for next week, 01/09/24 with Dr. Larraine. PCP does not have availability until the end of the month.   Daughter appreciative.   Chiquita JAYSON English, RN

## 2024-01-08 ENCOUNTER — Telehealth: Payer: Self-pay

## 2024-01-08 NOTE — Telephone Encounter (Signed)
 Spoke with Louisa, [facility] RN regarding verbal orders for continued skilled nursing for patient.   Verbal orders provided for 1 visit per week for the next 3 weeks.   Chiquita JAYSON English, RN

## 2024-01-09 ENCOUNTER — Ambulatory Visit

## 2024-01-09 ENCOUNTER — Other Ambulatory Visit: Payer: Self-pay

## 2024-01-09 VITALS — BP 159/66 | HR 74 | Ht 64.0 in | Wt 154.5 lb

## 2024-01-09 DIAGNOSIS — Z8639 Personal history of other endocrine, nutritional and metabolic disease: Secondary | ICD-10-CM

## 2024-01-09 DIAGNOSIS — R600 Localized edema: Secondary | ICD-10-CM | POA: Diagnosis not present

## 2024-01-09 DIAGNOSIS — R001 Bradycardia, unspecified: Secondary | ICD-10-CM | POA: Diagnosis not present

## 2024-01-09 DIAGNOSIS — E118 Type 2 diabetes mellitus with unspecified complications: Secondary | ICD-10-CM

## 2024-01-09 DIAGNOSIS — I10 Essential (primary) hypertension: Secondary | ICD-10-CM

## 2024-01-09 LAB — POCT GLYCOSYLATED HEMOGLOBIN (HGB A1C): HbA1c, POC (controlled diabetic range): 6.6 % (ref 0.0–7.0)

## 2024-01-09 NOTE — Assessment & Plan Note (Signed)
 BP of 159/66 on repeat; patient asymptomatic. Current regiment of amlodipine  10 mg daily. Also prescribed clonidine  0.1 mg BID, though has not been taking this given ran out of this medication and does not currently have funds to purchase.  - No medication changes indicated at this time given low-normal diastolic reading.

## 2024-01-09 NOTE — Patient Instructions (Addendum)
 Dear Lauren Craig,   It was great seeing you in clinic today! You came in for slow heart beat, or bradycardia. Given that you have not had any symptoms with this, I am not worried about it, and there is nothing additional to do.  You also reported edema/swelling of your legs.  There are a few things for you to do outside of clinic: - Take your Lasix  twice a day for a week to see if this helps with your leg swelling; try to take one dose in the morning and one dose early afternoon - Use compression socks, which you can buy in a pharmacy or on Amazon - Continue to elevate your legs, try to get your feet above your heart - I am getting some labs for you; results will be available in MyChart, and I will be in touch for any abnormal results  Thank you for allowing me to be a part of your care team! Alan Flies, MD [facility] [address] [place], [place], [place] [address] [phone]

## 2024-01-09 NOTE — Assessment & Plan Note (Addendum)
 A1c today of 6.6.

## 2024-01-09 NOTE — Progress Notes (Signed)
    SUBJECTIVE:   CHIEF COMPLAINT / HPI:   Lauren Craig is a 78 y.o. female presenting for recent episode of bradycardia. Also reports bilateral lower extremity edema. No other concerns. Accompanied by daughter, Samule. Has a home health nurse to help with meds.   Bradycardia Had a low resting HR of 41 during PT on 8/6; increased to ~60 with ambulation. No lightheadedness, dizziness at that time. Has an implanted loop recorder; being followed by cardiology; most recent check 8/7 normal. No current lightheadedness, presyncope, dizziness, chest pain, palpitations. No recent falls.  Bilateral Lower Extremity Edema Reports recent bilateral lower extremity which began in the past few weeks. Has been taking Lasix  20 mg daily and reports good urination with this. Denies shortness of breath; doesn't lay flat with back pain, no SOB with lying back.  Hypertension Has had elevated BP readings up to 220/74 per [facility]. Following with nephrology, no recent medication changes indicated. Does have lower diastolic, limiting options. No headache, blurry vision, chest pain.  Hx Hyperkalemia Hx recent hospitalization for hyperkalemia in 11/2023. Not currently taking lokelma  as she reports it tastes bad.  Care Gaps - Diabetic eye exam - already scheduled for 01/23/24 with [facility]  - Dexa scan - Not interested in this - Medicare AWV - already scheduled for 03/2024 (?) - Shingrix  (first dose 05/2021); doesn't remember second dose date but believes she got this - A1c - done this visit  PERTINENT  PMH / PSH: HTN, T2DM, CKD, hx hyperkalemia, diastolic CHF (HFpEF)  OBJECTIVE:   BP (!) 159/66   Pulse 74   Ht 5' 4 (1.626 m)   Wt 154 lb 8 oz (70.1 kg)   SpO2 99%   BMI 26.52 kg/m   General: Pt seated in chair, using walker to ambulate, no acute distress. Cardiovascular: Regular rate and rhythm, no murmurs/rubs/gallops. Respiratory: Normal work of breathing on room air. No wheezes, crackles.  Some transmitted upper respiratory sounds. Extremities: 2+ pitting bilateral lower extremity edema up to level of knee, some weeping of left calf. Mild tenderness to palpation of bilateral shins. 2+ PT pulses bilaterally. No skin changes.  ASSESSMENT/PLAN:   Assessment & Plan Bradycardia Patient with recent normal loop recorder reading, without symptoms; no indication for further workup outside of labs at this time. - Labs: TSH Controlled type 2 diabetes mellitus with complication, without long-term current use of insulin  (HCC) A1c today of 6.6. Hypertension, unspecified type BP of 159/66 on repeat; patient asymptomatic. Current regiment of amlodipine  10 mg daily. Also prescribed clonidine  0.1 mg BID, though has not been taking this given ran out of this medication and does not currently have funds to purchase.  - No medication changes indicated at this time given low-normal diastolic reading. Bilateral lower extremity edema Suspect related to diastolic heart failure; could also be component of venous stasis, though no overlying skin changes. Echo 05/2023 with EF 60-65, grade II diastolic dysfunction; not much changed from echo in 2023; no need for further imaging at this time - Labs: BMP, BNP - Increase Lasix  to 20 mg twice daily for week, patient to call clinic after regarding whether this helped - If BMP comes back with elevated Cr, consider adjusting Lasix  - Encouraged elevation, compression socks use History of hyperkalemia Not currently on Lokelma  - Labs: BMP  - If K elevated, will restart patient on Lokelma  10 mg daily   Alan Flies, MD [facility]

## 2024-01-09 NOTE — Assessment & Plan Note (Addendum)
 Patient with recent normal loop recorder reading, without symptoms; no indication for further workup outside of labs at this time. - Labs: TSH

## 2024-01-10 ENCOUNTER — Ambulatory Visit: Payer: Self-pay | Admitting: Family Medicine

## 2024-01-10 NOTE — Telephone Encounter (Signed)
 Called daughter with results. Continue current medications.   Suzann Daring, MD  Family Medicine Teaching Service

## 2024-01-11 ENCOUNTER — Ambulatory Visit: Payer: Self-pay

## 2024-01-11 LAB — BASIC METABOLIC PANEL WITH GFR
BUN/Creatinine Ratio: 16 (ref 12–28)
BUN: 30 mg/dL — ABNORMAL HIGH (ref 8–27)
CO2: 18 mmol/L — ABNORMAL LOW (ref 20–29)
Calcium: 9.6 mg/dL (ref 8.7–10.3)
Chloride: 104 mmol/L (ref 96–106)
Creatinine, Ser: 1.91 mg/dL — ABNORMAL HIGH (ref 0.57–1.00)
Glucose: 128 mg/dL — ABNORMAL HIGH (ref 70–99)
Potassium: 5 mmol/L (ref 3.5–5.2)
Sodium: 141 mmol/L (ref 134–144)
eGFR: 26 mL/min/1.73 — ABNORMAL LOW (ref >59)

## 2024-01-11 LAB — BRAIN NATRIURETIC PEPTIDE: BNP: 125.3 pg/mL — ABNORMAL HIGH (ref 0.0–100.0)

## 2024-01-11 LAB — TSH: TSH: 1.42 u[IU]/mL (ref 0.450–4.500)

## 2024-01-15 ENCOUNTER — Telehealth: Payer: Self-pay | Admitting: Family Medicine

## 2024-01-15 DIAGNOSIS — K209 Esophagitis, unspecified without bleeding: Secondary | ICD-10-CM

## 2024-01-15 DIAGNOSIS — I739 Peripheral vascular disease, unspecified: Secondary | ICD-10-CM

## 2024-01-15 DIAGNOSIS — F39 Unspecified mood [affective] disorder: Secondary | ICD-10-CM

## 2024-01-15 DIAGNOSIS — I10 Essential (primary) hypertension: Secondary | ICD-10-CM

## 2024-01-15 DIAGNOSIS — J301 Allergic rhinitis due to pollen: Secondary | ICD-10-CM

## 2024-01-15 DIAGNOSIS — I63512 Cerebral infarction due to unspecified occlusion or stenosis of left middle cerebral artery: Secondary | ICD-10-CM

## 2024-01-15 NOTE — Telephone Encounter (Signed)
 Received form that patient (?)wants all medications to [facility]. Please call daughter and confirm preferred pharmacy. This appears to be from insurance  Thanks Suzann Daring, MD  [facility] Medicine Teaching Service

## 2024-01-15 NOTE — Telephone Encounter (Signed)
 Spoke with pt daughter and that is correct. Lauren Craig, CMA

## 2024-01-16 MED ORDER — ONDANSETRON 4 MG PO TBDP: 4.0000 mg | ORAL_TABLET | Freq: Three times a day (TID) | ORAL | 11 refills | Status: AC | PRN

## 2024-01-16 MED ORDER — ESCITALOPRAM OXALATE 20 MG PO TABS: 20.0000 mg | ORAL_TABLET | Freq: Every day | ORAL | 3 refills | Status: AC

## 2024-01-16 MED ORDER — FEXOFENADINE HCL 60 MG PO TABS: 60.0000 mg | ORAL_TABLET | Freq: Two times a day (BID) | ORAL | 3 refills | Status: AC

## 2024-01-16 MED ORDER — EZETIMIBE 10 MG PO TABS: 10.0000 mg | ORAL_TABLET | Freq: Every day | ORAL | 3 refills | Status: DC

## 2024-01-16 MED ORDER — ROSUVASTATIN CALCIUM 10 MG PO TABS
10.0000 mg | ORAL_TABLET | Freq: Every day | ORAL | 3 refills | Status: AC
Start: 2024-01-16 — End: ?

## 2024-01-16 MED ORDER — ASPIRIN 81 MG PO TBEC: 81.0000 mg | DELAYED_RELEASE_TABLET | Freq: Every day | ORAL | 3 refills | Status: AC

## 2024-01-16 MED ORDER — CLONIDINE HCL 0.1 MG PO TABS: 0.1000 mg | ORAL_TABLET | Freq: Two times a day (BID) | ORAL | 3 refills | Status: DC

## 2024-01-16 MED ORDER — AMLODIPINE BESYLATE 10 MG PO TABS: 10.0000 mg | ORAL_TABLET | Freq: Every day | ORAL | 3 refills | Status: AC

## 2024-01-16 MED ORDER — FUROSEMIDE 20 MG PO TABS: 20.0000 mg | ORAL_TABLET | Freq: Every day | ORAL | 3 refills | Status: DC | PRN

## 2024-01-16 MED ORDER — ALBUTEROL SULFATE HFA 108 (90 BASE) MCG/ACT IN AERS: 2.0000 | INHALATION_SPRAY | Freq: Four times a day (QID) | RESPIRATORY_TRACT | 3 refills | Status: AC | PRN

## 2024-01-16 NOTE — Addendum Note (Signed)
 Addended by: DELORES, Arlina Sabina on: 01/16/2024 08:49 AM   Modules accepted: Orders

## 2024-01-16 NOTE — Telephone Encounter (Signed)
 [facility]ills [facility] to [facility].  Suzann Daring, MD  Family Medicine Teaching Service

## 2024-01-18 ENCOUNTER — Encounter (INDEPENDENT_AMBULATORY_CARE_PROVIDER_SITE_OTHER): Payer: Self-pay

## 2024-01-30 DIAGNOSIS — M79606 Pain in leg, unspecified: Secondary | ICD-10-CM | POA: Diagnosis not present

## 2024-01-30 DIAGNOSIS — M7989 Other specified soft tissue disorders: Secondary | ICD-10-CM | POA: Diagnosis not present

## 2024-01-30 DIAGNOSIS — M79662 Pain in left lower leg: Secondary | ICD-10-CM | POA: Diagnosis not present

## 2024-01-30 DIAGNOSIS — I872 Venous insufficiency (chronic) (peripheral): Secondary | ICD-10-CM | POA: Diagnosis not present

## 2024-01-30 DIAGNOSIS — I878 Other specified disorders of veins: Secondary | ICD-10-CM | POA: Diagnosis not present

## 2024-01-30 DIAGNOSIS — R609 Edema, unspecified: Secondary | ICD-10-CM | POA: Diagnosis not present

## 2024-01-31 DIAGNOSIS — R918 Other nonspecific abnormal finding of lung field: Secondary | ICD-10-CM | POA: Diagnosis not present

## 2024-01-31 DIAGNOSIS — N1832 Chronic kidney disease, stage 3b: Secondary | ICD-10-CM | POA: Diagnosis not present

## 2024-01-31 DIAGNOSIS — I517 Cardiomegaly: Secondary | ICD-10-CM | POA: Diagnosis not present

## 2024-01-31 DIAGNOSIS — I130 Hypertensive heart and chronic kidney disease with heart failure and stage 1 through stage 4 chronic kidney disease, or unspecified chronic kidney disease: Secondary | ICD-10-CM | POA: Diagnosis not present

## 2024-01-31 DIAGNOSIS — I5033 Acute on chronic diastolic (congestive) heart failure: Secondary | ICD-10-CM | POA: Diagnosis not present

## 2024-01-31 DIAGNOSIS — N2889 Other specified disorders of kidney and ureter: Secondary | ICD-10-CM | POA: Diagnosis not present

## 2024-01-31 DIAGNOSIS — I440 Atrioventricular block, first degree: Secondary | ICD-10-CM | POA: Diagnosis not present

## 2024-01-31 DIAGNOSIS — Z7982 Long term (current) use of aspirin: Secondary | ICD-10-CM | POA: Diagnosis not present

## 2024-01-31 DIAGNOSIS — N179 Acute kidney failure, unspecified: Secondary | ICD-10-CM | POA: Diagnosis not present

## 2024-01-31 DIAGNOSIS — Z79899 Other long term (current) drug therapy: Secondary | ICD-10-CM | POA: Diagnosis not present

## 2024-01-31 DIAGNOSIS — M79662 Pain in left lower leg: Secondary | ICD-10-CM | POA: Diagnosis not present

## 2024-01-31 DIAGNOSIS — J810 Acute pulmonary edema: Secondary | ICD-10-CM | POA: Diagnosis not present

## 2024-01-31 DIAGNOSIS — R609 Edema, unspecified: Secondary | ICD-10-CM | POA: Diagnosis not present

## 2024-01-31 DIAGNOSIS — I872 Venous insufficiency (chronic) (peripheral): Secondary | ICD-10-CM | POA: Diagnosis not present

## 2024-01-31 DIAGNOSIS — R001 Bradycardia, unspecified: Secondary | ICD-10-CM | POA: Diagnosis not present

## 2024-01-31 DIAGNOSIS — I878 Other specified disorders of veins: Secondary | ICD-10-CM | POA: Diagnosis not present

## 2024-01-31 DIAGNOSIS — I160 Hypertensive urgency: Secondary | ICD-10-CM | POA: Diagnosis not present

## 2024-01-31 DIAGNOSIS — Z87891 Personal history of nicotine dependence: Secondary | ICD-10-CM | POA: Diagnosis not present

## 2024-02-01 NOTE — Progress Notes (Signed)
 Case Management Screening  CSN: [id number] DOB: 08-10-1945 Service: General Medicine Location: R828/A   Initial Screening Readmission Risk Score v2: 17.7 Risk Level: Low - Patient does not meet high risk criteria for post hospital services. Social Investment banker, operational will be available to assist as indicated.  Patient is a 78 y.o. female with sPMH HTN and otherwise unknown hx (potentially DM, also reports having a cardiac device but uncertain what it is) presenting to the ED for edema. CM/SW will continue to follow for discharge needs.    Diamantina Sharps, MSW, [place] [phone]

## 2024-02-01 NOTE — Consults (Signed)
 Nutrition Note   PATIENT NAME: Lauren Craig DATE: 02/01/2024  TIME: 11:39 AM  Note Type: Brief note Referral Reason: MST 2  Pt screened by RD for MST 2. Per chart review and discussion with pt, dry wt (154#) stable over past several months. RD to see pt for LOS assessment on day 7 - 10 of admit. Please contact RD if pt needs to be seen sooner.    Follow-up Information  Follow-Up Date: 02/09/24 Follow-Up Reason: Reassessment   Contact RD on treatment team. After hours or weekends, use secure chat group: [facility]  [facility] Adult Dietitians [place]  [facility] Dietitians [facility]  [facility] Dietitians [facility]  [facility] Dietitians [facility]  [facility] Dietitians   Lauren Craig [place], [place] 02/01/2024 11:39 AM

## 2024-02-01 NOTE — Discharge Summary (Signed)
 Hospital Medicine Discharge Summary   Demographics: Lauren Craig  78 y.o. [birth year] MRN: [id number]    Extended Emergency Contact Information Primary Emergency Contact: [place] Mobile Phone: [phone] Relation: Grandchild  Full Code  Admit Date: 01/30/2024                            Attending Physician: Elspeth Horde, MD Discharge Date: 02/01/2024  Primary Care Provider: PCP Needed PPI   None  Consults during this admission: Consult Orders     None       Active & Resolved Diagnosis: Principal Problem (Resolved):   AKI (acute kidney injury) Active Problems:   CKD (chronic kidney disease) Resolved Problems:   Acute on chronic HFpEF   Disposition: Patient discharged to Home in stable condition.   Discharge follow-up recommendations : See [facility] Course: Lauren Craig is a 78 y.o. 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   Patient is a poor historian. Per report, the patient has had several days of developing edema in her lower extremities with associated pain. She believes the L leg was more swollen than other. Otherwise denies any symptoms. Reports she has seen recently seen a nephrologist in [place] (uncertain why) and was told her kidneys were fine.    In the ED, vitals were T 97.62f, HR 66, RR 16, BP 150/53, satting well on room air. Initial labs divulged Hb 10.5 w/MCV 76.8, Cr 1.81 w/BUN 33 (unknown baseline), otherwise CBC and CMP okay. BNP 169 and BMI 25.2.  US  DVT was performed and negative for DVTs. CXR noted cardiomegaly with mild pulmonary edema. Vascular pedicle width on my interpretation was > 70mm.  Patient was treated for acute CHF and responded well to IV lasix  with resolution of her 3 pillows orthopnea and 1+ bilateral pitting edema. She will dc on double dose lasix  40mg  BD. Her  renal ultrasound was unremarkable. 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 This would make sense given she was referred to a nephrologist.  She will need to fu with nephrology, PCP and cardiology as an outpatient. She did not require supplemental o2. She lives alone and ambulates with a RW as needed.             Wound / Incision Assessment: Refer to Chart Review and Media Tab for images if available.   Wound 01/31/24 Sacrum Mid (Active)  Date First Assessed: 01/31/24   Location: Sacrum  Wound Location Orientation: Mid  Wound Description (Comments): stage 2. ( redness with scratches)    Temp:  [97.5 F (36.4 C)-98.6 F (37 C)] 98.6 F (37 C) Heart Rate:  [53-60] 53 Resp:  [17-18] 18 BP: (142-178)/(43-78) 176/49      Discharge Medications     Modified Medications      Sig Disp Refill Start End  furosemide  20 mg tablet Commonly known as: LASIX  What changed: how much to take  Take 2 tablets (40 mg total) by mouth 2 (two) times a day.  360 tablet  0         Medications To Continue      Sig Disp Refill Start End  amLODIPine  5 mg tablet Commonly known as: NORVASC   Take 5 mg by mouth nightly.  0     aspirin  81 mg EC tablet  Take 81 mg by mouth daily.   0     carvediloL  12.5 mg tablet Commonly known as: COREG   Take 12.5 mg by mouth in the morning and 12.5 mg in the evening. Take with meals.   0     cloNIDine  0.1 mg tablet Commonly known as: CATAPRES   Take 1 tablet by mouth in the morning and 1 tablet in the evening.   0     escitalopram  20 mg tablet Commonly known as: LEXAPRO   Take 1 tablet by mouth daily.   0     ezetimibe  10 mg tablet Commonly known as: ZETIA   Take 10 mg by mouth daily.   0     fluticasone  propionate 50 mcg/spray nasal spray Commonly known as: FLONASE   Administer 2 sprays into each nostril daily.   0     hydrALAZINE  50 mg tablet Commonly known as: APRESOLINE   Take 1  tablet by mouth in the morning and 1 tablet at noon and 1 tablet in the evening. Take with meals.   0     nystatin  100,000 unit/gram cream Commonly known as: MYCOSTATIN   Apply topically 4 (four) times a day as needed (on rash for yeast).   0     rosuvastatin  10 mg tablet Commonly known as: CRESTOR   Take 1 tablet by mouth daily.   0         Discharge Orders     Ambulatory referral to Nephrology     Ambulatory referral to PCP     Full Code     Lifting Limits:     Details:    Lifting Limits: No lifting limits   Ambulatory referral to Cardiology     Details:    Are you referring from the ED?: No   Reason for referral: Cardiology - Adult         Lab Results  Component Value Date/Time   HGB 10.0 (L) 01/31/2024 06:23 AM   HCT 30.1 (L) 01/31/2024 06:23 AM   WBC 7.50 01/31/2024 06:23 AM   PLT 259 01/31/2024 06:23 AM   Lab Results  Component Value Date/Time   NA 139 02/01/2024 05:53 AM   K 4.4 02/01/2024 05:53 AM   CREATININE 1.81 (H) 02/01/2024 05:53 AM   BUN 27 (H) 02/01/2024 05:53 AM   GLUCOSE 142 (H) 02/01/2024 05:53 AM    Pertinent Imaging: Transthoracic echo (TTE) complete  Final Result by Ozell Fairy Burton Mickey., MD (09/03 1642)                                                    [facility]                                                  [facility]  [facility]                                                   [address], [place].                                                     [id number]                                   Transthoracic Echocardiogram Report  Name  Craig, Lauren                     Study Date  01-31-2024                   Height  65.5 in  MRN  [id number]                                  Patient Location  [id number]               Weight  154 lb  DOB  Aug 05, 1945                                Gender  Female                          BSA  1.8 m2  Age  4 yrs                                    Ethnicity  1                            BP  150-53 mmHg  Reason For Study  hypervolemia                                                         HR  54  Ordering Physician  DARROLL LERNER LOUIS       Performed By  BONNETTA BOTTCHER  Referring Physician  DARROLL LERNER LOUIS  -  -  PROCEDURE  A two-dimensional transthoracic echocardiogram with color flow and Doppler   was performed.  Technically difficult examination, which may reduce the diagnostic   sensitivity of this study.  -  SUMMARY  The left ventricular size is normal with mild concentric hypertrophy.  Left ventricular systolic function is normal.  The right ventricle is not well visualized.  There is no significant valvular stenosis or regurgitation.  There is no pericardial effusion.  -  FINDINGS   LEFT VENTRICLE  The left ventricular size is normal with mild concentric hypertrophy. Left   ventricular systolic  function is normal. LV ejection fraction = 60-65%. Left ventricular   filling pattern is prolonged  relaxation. The left ventricular wall motion is normal.  -  RIGHT VENTRICLE  The right ventricle is not well visualized.  LEFT ATRIUM  The left atrial size is normal.  RIGHT ATRIUM  Right atrial size is normal.  -  AORTIC VALVE  The aortic valve is not well visualized. There is no aortic regurgitation.   There is no aortic  stenosis.  -  MITRAL VALVE  There is mild mitral annular calcification. There is trace mitral   regurgitation.  -  TRICUSPID VALVE  Structurally normal tricuspid valve. There is trace tricuspid   regurgitation. There was insufficient  TR detected to calculate RV systolic pressure.  -  PULMONIC VALVE   Structurally normal pulmonic valve. There is no pulmonic valvular   regurgitation.  -  ARTERIES  The aortic sinus is normal size.  -  VENOUS  The IVC is normal in size with an inspiratory collapse of greater than   50%, suggesting normal right  atrial pressure.  -  EFFUSION  There is no pericardial effusion.  -  -  MMode-2D Measurements & Calculations  IVSd  1.3 cm                 LA diam  3.6 cm       ESV MOD-sp4   12.6 ml         Ao sinus diam  2.7 cm  LVIDd  3.8 cm                EDV MOD-sp4   48.0 ml EDV MOD-sp2   49.7 ml  LVPWd  1.3 cm                                      ESV MOD-sp2   16.1 ml  LVIDs  2.6 cm              ___________________________________________________________________________  ______  LVOT diam  2.2 cm            SV MOD-sp4   35.4 ml  Indexed LVEDV  4Ch            IVC 1  1.5 cm  SI MOD-sp4            27.0 ml-m2                               19.9 ml-m2              ___________________________________________________________________________  ______  LA Vol Indexed  MOD          LAVol MOD-bp          LAVol MOD-sp2   48.5 ml       LAVol MOD-sp4    30.3 ml-m2                   53.9 ml                                             52.8 ml              ___________________________________________________________________________  ______                               TAPSE  2.7 cm  RA area A4  16.6 cm2  Doppler Measurements & Calculations  MV E max vel           MVA P1-2t   2.0 cm2           MV P1-2t max vel           SV LVOT   99.7 ml  79.8 cm-sec                                          81.3 cm-sec                Ao V2 max   MV A max vel                                         MV P1-2t  110.8 msec       209.2 cm-sec  109.0 cm-sec                                                                    Ao max PG  17.5 mmHg  MV E-A  0.73                                                                    Ao V2 mean   Med Peak E   Vel  142.0 cm-sec  4.6 cm-sec                                                                      Ao mean PG  9.0 mmHg  Lat Peak E  Vel                                                                 Ao V2 VTI  53.9 cm  6.1 cm-sec                                                                      AVA  VTI   1.9 cm2  E-Lat E`  13.0  E-Med E`  17.2              ___________________________________________________________________________  ______  LV V1 VTI  26.5 cm     AS Dimensionless Index  VTI   AVAi VTI  cm^2-m^2         SV index LVOT                           0.49                          1.0 cm2                    56.0 ml-m2  ___________________________________________________________________________  ___  Reading Physician                     MD Ozell Fairy Burton Mickey, MD, [id number] 01-31-2024 04   42 PM    US  Renal Bilateral Complete  Final Result by Carlin Luci Lawyer, MD (09/03 1046)  US  RENAL BILATERAL COMPLETE, 01/31/2024 9:50 AM    INDICATION: aki vs ckd   COMPARISON: None.    TECHNIQUE: Multi-planar, real-time ultrasonography of the retroperitoneum   and urinary tract using grayscale imaging was performed, supplemented by   color and/or power Doppler as needed.    FINDINGS:    .  Right Kidney: Length = 10.8 cm. Normal contour and echogenicity. No   hydronephrosis or perinephric fluid. No focal mass is identified.   .  Left Kidney: Length = 10.1 cm. Normal contour and echogenicity. No   hydronephrosis or perinephric fluid. No focal mass is identified.   .  Bladder: Not well visualized, likely due to decompression.  .  Additional comments: None.    IMPRESSION:  No hydronephrosis. Unremarkable renal ultrasound.        XR Chest 1 View  Final Result by Debby Catarina Dibble, MD (09/03 1005)  XR CHEST 1 VIEW, 01/31/2024 1:15 AM    INDICATION:Fluid overload   COMPARISON: None.  FINDINGS:      Supportive devices: Loop recorder projects over the left chest wall.   Cardiovascular/lungs/pleura: Similar enlarged cardiac silhouette. Mild   diffuse versus opacities. Nodular opacity at the lung apices measuring up   to 7 mm in diameter. No pleural effusion. No pneumothorax.  Other: No acute osseous abnormality. The imaged upper abdomen is   unremarkable.    IMPRESSION:  1.  Cardiomegaly with mild pulmonary edema.  2.  Nodular opacities in the lung apices measuring up 7 mm in diameter may   be infectious/inflammatory. Recommend follow-up imaging in 6-8 weeks to   assess stability.        US  Peripheral Venous Legs  Final Result by Lynwood Pac Perumpillichira, MD (09/03 9196)  US  PERIPHERAL VENOUS LEGS, 01/30/2024 10:47 PM    INDICATION: New onset of swelling in the legs left greater than the right      COMPARISON: None.    TECHNIQUE: Multi-planar real-time ultrasonography of the lower extremity   venous systems was performed using grayscale imaging along with Doppler   color flow and spectral analysis. Compression and augmentation maneuvers   were utilized as needed.    FINDINGS:    RIGHT LOWER EXTREMITY  .  Common femoral vein: Patent.  .  Femoral vein: Patent.  .  Profunda femoral vein: Patent.  .  Greater saphenous vein: Patent.  .  Popliteal vein: Patent.  .  Gastrocnemius vein: Patent.  .  Anterior tibial vein: Not visualized.  SABRA  Posterior tibial vein: Not visualized.  SABRA  Peroneal vein: Not visualized.  .  Venous compressibility: Normal.  .  Respiratory variation: Normal.  .  Waveform response to augmentation: Normal.  .  Additional comments: None.    LEFT LOWER EXTREMITY  .  Common femoral vein: Patent.  .  Femoral vein: Patent.  .  Profunda femoral vein: Patent.  .  Greater saphenous vein: Patent.  .  Popliteal vein: Patent.  .  Gastrocnemius vein: Patent.  .  Anterior tibial vein: Not visualized.  SABRA  Posterior tibial vein: Not visualized.  SABRA  Peroneal  vein: Not visualized.  .  Venous compressibility: Normal.  .  Respiratory variation: Normal.  .  Waveform response to augmentation: Normal.  .  Additional comments: Bilateral lower extremity edema.    IMPRESSION:  No evidence of deep vein thrombosis within the visualized anatomy.      XR Tibia Fibula 2 Views Left  Final Result by Roena Hy, MD (09/02 2054)  XR TIBIA FIBULA 2 VIEWS LEFT, 01/30/2024 8:03 PM    INDICATION: Lower extremity pain   COMPARISON: None.    IMPRESSION:  1.  No acute fracture.   2.  No malalignment.  3.  Chondrocalcinosis.  4.  Mild medial and lateral compartmental degenerative changes of the   knee.  5.  Osteopenia.  6.  Vascular calcifications.        Electronically signed by: Elspeth Horde, MD 02/01/2024 11:23 AM   Time spent on discharge: 35 minutes

## 2024-02-02 ENCOUNTER — Telehealth: Payer: Self-pay

## 2024-02-02 MED ORDER — FUROSEMIDE 20 MG PO TABS: 40.0000 mg | ORAL_TABLET | Freq: Two times a day (BID) | ORAL | 0 refills | Status: AC

## 2024-02-02 MED ORDER — FAMOTIDINE 10 MG PO TABS: 10.0000 mg | ORAL_TABLET | Freq: Every day | ORAL | 3 refills | Status: DC

## 2024-02-02 NOTE — Telephone Encounter (Signed)
 [facility] calls nurse line requesting new prescriptions for Famotidine  and Lasix .  She reports it appears the patients Lasix  was changed to 20mg  BID. She reports they need a new script reflecting this if appropriate.   I called [facility] and cancelled remaining refills of Famotidine .   Will forward to PCP to send to [facility].

## 2024-02-02 NOTE — Telephone Encounter (Signed)
 Rx to pharmacy. Patient recently discharged. Her Lasix  dose changed. I sent in the new dose. She needs labs and hospital follow up Monday or Tuesday week of 9/8. Please call daughter and help schedule.

## 2024-02-02 NOTE — Transitions of Care (Post Inpatient/ED Visit) (Signed)
 Today's TOC FU Call Status: Today's TOC FU Call Status:: Successful TOC FU Call Completed TOC FU Call Complete Date: 02/02/24 (Daughter, [name], Answered call and is not physically with patient.) Patient's Name and Date of Birth confirmed.  Transition Care Management Follow-up Telephone Call Date of Discharge: 02/01/24 Discharge Facility: Other ([facility]) Name of Other ([facility]) Discharge Facility: [facility] Type of Discharge: Inpatient Admission Primary Inpatient Discharge Diagnosis:: Acute on chronic HFpEF and AKI. Any questions or concerns?: Yes Patient Questions/Concerns:: Daughter had not been notified of hospitalization from retirement community. Reviewed DC Summary and Medications changes, follow up appointments with daughter who was not with patient. Patient now resides in [place], [place] at [facility] [place] facility.  Items Reviewed: Medications obtained,verified, and reconciled?: Partial Review Completed (Only medications changes were reviewed from discharge summary with daughter as informant, not with patient at time of call. RN CM number given to daughter to call back if needed after she is able to speak to patient and facillity, and will make PCP f/u.) Reason for Partial Mediation Review: Only medications changes were reviewed from discharge summary with daughter as informant, not with patient at time of call. RN CM number given to daughter to call back if needed after she is able to speak to patient and facillity, and will make PCP f/u.  Medications Reviewed Today: Only medications changes were reviewed from discharge summary with daughter as informant, not with patient at time of call.  Medications Reviewed Today   Medications were not reviewed in this encounter     Home Care and Equipment/Supplies: Were Home Health Services Ordered?: Yes Name of Home Health Agency:: resumption of home health wtih [facility] Has Agency set up a time to come to your home?:   (Daughter will connect with [facility])  Functional Questionnaire: NA    Follow up appointments reviewed: via [facility] DC Summary. Patient will continue with [facility] Suzann Daring as PCP per daughter, and has a nephrologist. Will check on cardiologist follow up. Patient was current with [facility] home health prior to admission. Daughter has RN's contact information to reach out for res[place] of care [place].    02/02/24: TOC RN CM outreach connected with daughter, Samule, informant for this call.  D[place] had not [place]. RN CM reviewed DC Summary and Medications changes, follow up appointments with daughter who was not with patient.  Patient now resides in [place], [place] at [facility] [place] facility. Daughter will follow up on appointments, make sure pharmacy knows of medications changes, make PCP HFU with Suzann Daring, and was given RN CM number if needed, but declined follow up calls at this time.  Daughter was at work so call was completed as much as possible.      Bing Edison MSN, RN RN Case Sales executive Health  VBCI-Population Health Office Hours M-F [phone] Direct Dial: [phone] Main Phone [phone]  Fax: [phone] [email or web address]

## 2024-02-02 NOTE — Telephone Encounter (Signed)
 Paralee from [place] calling for Skilled nursing verbal orders as follows:  1 time(s) weekly for 4 week(s).   She is also requesting orders for OT evaluation.  Verbal orders given per [facility] protocol  Chiquita JAYSON English, RN

## 2024-02-05 ENCOUNTER — Encounter: Payer: Self-pay | Admitting: Pharmacist

## 2024-02-05 ENCOUNTER — Other Ambulatory Visit: Payer: Self-pay | Admitting: Family Medicine

## 2024-02-05 ENCOUNTER — Ambulatory Visit: Payer: PPO

## 2024-02-05 ENCOUNTER — Telehealth: Payer: Self-pay

## 2024-02-05 DIAGNOSIS — I639 Cerebral infarction, unspecified: Secondary | ICD-10-CM

## 2024-02-05 NOTE — Telephone Encounter (Signed)
 Called patient's daughter.   Scheduled hospital follow up with Dr. Rosendo tomorrow afternoon (02/06/24).   Chiquita JAYSON English, RN

## 2024-02-05 NOTE — Progress Notes (Signed)
 Chart accessed for medication adherence.   Patient picked up rosuvastatin  10 mg #90 on 01/16/24

## 2024-02-05 NOTE — Telephone Encounter (Signed)
 [name] calling for PT verbal orders as follows:  1 time(s) weekly for 8 week(s).  Verbal orders given per [facility] protocol  Chiquita JAYSON English, RN

## 2024-02-06 ENCOUNTER — Telehealth: Payer: Self-pay | Admitting: Family Medicine

## 2024-02-06 ENCOUNTER — Ambulatory Visit: Payer: Self-pay | Admitting: Cardiology

## 2024-02-06 ENCOUNTER — Ambulatory Visit (INDEPENDENT_AMBULATORY_CARE_PROVIDER_SITE_OTHER): Admitting: Student

## 2024-02-06 ENCOUNTER — Encounter: Payer: Self-pay | Admitting: Student

## 2024-02-06 VITALS — BP 169/48 | HR 56 | Wt 155.4 lb

## 2024-02-06 DIAGNOSIS — I63512 Cerebral infarction due to unspecified occlusion or stenosis of left middle cerebral artery: Secondary | ICD-10-CM | POA: Diagnosis not present

## 2024-02-06 DIAGNOSIS — I5032 Chronic diastolic (congestive) heart failure: Secondary | ICD-10-CM

## 2024-02-06 DIAGNOSIS — N184 Chronic kidney disease, stage 4 (severe): Secondary | ICD-10-CM

## 2024-02-06 LAB — CUP PACEART REMOTE DEVICE CHECK
Date Time Interrogation Session: 20250907044845
Implantable Pulse Generator Implant Date: 20240102
Pulse Gen Model: 5000
Pulse Gen Serial Number: 511019975

## 2024-02-06 NOTE — Progress Notes (Signed)
    SUBJECTIVE:   CHIEF COMPLAINT / HPI:   78 year old female with history of CVA Presenting for hospital follow-up Admitted to the hospital for 2 days for BLE edema with assoicated pain (L>R) In addition to bilateral lower extremity edema patient reported orthopnea She is accompanied by daughter who said patient has had a history of chronic lower extremity Unsure of last appointment with cardiologist Previously on Lasix  20 mg but increased to 40 mg daily Symptoms have since improved since discharge from the hospital CXR obtained showed cardiomegaly with mild pulmonary edema  Started on Lasix  40mg  BID At the hospital BNP 169, echo showed trace mitra and Tricuspid regurg with EF 60-65% and prolonged LV relaxation  History of CVA Currently currently wheelchair-bound and daughter reports residual difficulty with speech.  Daughter believes like patient will benefit from PT/OT and speech therapy.  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  Daughter reports some residual weakness and dysarthric speech. Given benefit with PT/OT and SLP in the past will be appropriate to reestablish therapy. -Placed referral for PT/OT and SLP  (HFpEF) heart failure with preserved ejection fraction (HCC) Most recent echo showed EF 60- 65% few days ago.  Patient's reports lower extremity edema and orthopnea have significantly improved since increasing Lasix  to 40 mg twice daily.  Not currently followed by cardiology. LE edema is a chronic issue for patient. -Ordered BMP to monitor electrolyte -Placed referral to cardiology - Recommend close renal monitoring with nephrologist given increased lasix  use - Continue Lasix  40 mg BID -Advised used of compression socks and keeping legs elevated      Norleen April, MD [facility]

## 2024-02-06 NOTE — Telephone Encounter (Signed)
 Ordered home health. Including details for Dr. Rosendo who sees patient next  From MyChart:  Yes, I want all the therapy she can get, including speech therapy. I've hired a Therapist, nutritional to dispense meds and help her with bathing and dressing until her insurance clicks in. [employer] contracts at [employer].    See you in office tomorrow. Looking forward to seeing Dr Rosendo.    Holiday Erik never notified me that Mom had gone to the hospital last week.    I only learned of this when the hospital called to tell me she was being discharged. WTH?

## 2024-02-06 NOTE — Patient Instructions (Signed)
 Pleasure to see you today.  Today we ordered labs to monitor your electrolytes and kidney function.  I have placed a referral to a cardiologist.  They will call you once this has been approved by your insurance.  Also looking at the hospital notes they recommend that you take Lasix  40 mg 2 times daily.  I have also placed a referral for PT/OT and speech therapy.

## 2024-02-06 NOTE — Assessment & Plan Note (Signed)
 Previously bedridden due to CVA initially; has improved and able to ambulate at this time.  Daughter reports some residual weakness and dysarthric speech. Given benefit with PT/OT and SLP in the past will be appropriate to reestablish therapy. -Placed referral for PT/OT and SLP

## 2024-02-06 NOTE — Assessment & Plan Note (Signed)
 Most recent echo showed EF 60- 65% few days ago.  Patient's reports lower extremity edema and orthopnea have significantly improved since increasing Lasix  to 40 mg twice daily. Not currently followed by cardiology. LE edema is a chronic issue for patient. -Ordered BMP to monitor electrolyte -Placed referral to cardiology - Recommend close renal monitoring with nephrologist given increased lasix  use - Continue Lasix  40 mg BID -Advised used of compression socks and keeping legs elevated

## 2024-02-07 ENCOUNTER — Telehealth: Payer: Self-pay | Admitting: Family Medicine

## 2024-02-07 LAB — BASIC METABOLIC PANEL WITH GFR
BUN/Creatinine Ratio: 20 (ref 12–28)
BUN: 52 mg/dL — ABNORMAL HIGH (ref 8–27)
CO2: 20 mmol/L (ref 20–29)
Calcium: 8.7 mg/dL (ref 8.7–10.3)
Chloride: 96 mmol/L (ref 96–106)
Creatinine, Ser: 2.6 mg/dL — ABNORMAL HIGH (ref 0.57–1.00)
Glucose: 245 mg/dL — ABNORMAL HIGH (ref 70–99)
Potassium: 4.4 mmol/L (ref 3.5–5.2)
Sodium: 134 mmol/L (ref 134–144)
eGFR: 18 mL/min/1.73 — ABNORMAL LOW (ref >59)

## 2024-02-07 MED ORDER — COVID-19 MRNA VAC-TRIS(PFIZER) 30 MCG/0.3ML IM SUSY: 0.3000 mL | PREFILLED_SYRINGE | Freq: Once | INTRAMUSCULAR | 0 refills | Status: DC

## 2024-02-07 MED ORDER — COVID-19 MRNA VAC-TRIS(PFIZER) 30 MCG/0.3ML IM SUSY: 0.3000 mL | PREFILLED_SYRINGE | Freq: Once | INTRAMUSCULAR | 0 refills | Status: AC

## 2024-02-07 NOTE — Telephone Encounter (Signed)
 Resent COVID vaccine to [facility] [address]

## 2024-02-07 NOTE — Telephone Encounter (Signed)
 COVID vaccine to pharmacy per staff message.

## 2024-02-09 ENCOUNTER — Telehealth: Payer: Self-pay

## 2024-02-09 NOTE — Telephone Encounter (Signed)
 Elberta OT with [facility] calls nurse line requesting verbal orders for [facility] OT as follows.   1x a week for 8 weeks.   Verbal order given per [facility] protocol.

## 2024-02-09 NOTE — Telephone Encounter (Signed)
 Received call from Home Health RN regarding clarification on Lasix  dosing. Provided with new dosage information. No further questions.   Chiquita JAYSON English, RN

## 2024-02-15 ENCOUNTER — Other Ambulatory Visit: Payer: Self-pay | Admitting: *Deleted

## 2024-02-15 ENCOUNTER — Telehealth: Payer: Self-pay

## 2024-02-15 DIAGNOSIS — I10 Essential (primary) hypertension: Secondary | ICD-10-CM

## 2024-02-15 MED ORDER — CLONIDINE HCL 0.1 MG PO TABS: 0.1000 mg | ORAL_TABLET | Freq: Two times a day (BID) | ORAL | 3 refills | Status: AC

## 2024-02-15 NOTE — Telephone Encounter (Signed)
 [place] skilled nursing with [facility] calls nurse line requesting verbal orders.   She reports within the last week the patient has developed a LE wound. She denies any malodorous drainage. She denies any pain or increased swelling. No fevers or chills. She reports clear drainage. She reports the patient is well appearing.   She is requesting VO for wound care and PT orders.   VO given for wound care and PT recert.   Advised will forward to PCP.

## 2024-02-15 NOTE — Telephone Encounter (Signed)
 Please call and schedule visit to be evaluated--also had recent change in Lasix  so needs labs  Suzann Daring, MD  [facility] Medicine Teaching Service

## 2024-02-15 NOTE — Progress Notes (Signed)
 Remote Loop Recorder Transmission

## 2024-02-19 ENCOUNTER — Telehealth: Payer: Self-pay | Admitting: Family Medicine

## 2024-02-19 DIAGNOSIS — E118 Type 2 diabetes mellitus with unspecified complications: Secondary | ICD-10-CM

## 2024-02-19 DIAGNOSIS — I5032 Chronic diastolic (congestive) heart failure: Secondary | ICD-10-CM

## 2024-02-19 DIAGNOSIS — N179 Acute kidney failure, unspecified: Secondary | ICD-10-CM

## 2024-02-19 NOTE — Telephone Encounter (Signed)
 Received note from daughter that insurance pays for bathing and housekeeping--RN Team- is this a home health referral or other referral?  Please let me know and I will place order or complete form  Suzann Daring, MD  [facility] Medicine Teaching Service

## 2024-02-19 NOTE — Telephone Encounter (Signed)
 Thank you :)

## 2024-02-19 NOTE — Telephone Encounter (Signed)
 Called [place].   Discussed the services she requested are only covered by Medicaid.   She reports she has been paying out of pocket for a private duty nurse to come out and help her with her pill pack.  Myles Cella should be able to assist with pill box and perhaps a CNA for bathing.   It appears she is already getting skilled nursing and assistance with medications?  Can perhaps add a CNA for bathing.   Will forward to PCP for advisement.

## 2024-02-20 NOTE — Addendum Note (Signed)
 Addended by: DELORES, Sofhia Ulibarri on: 02/20/2024 08:01 AM   Modules accepted: Orders

## 2024-02-21 DIAGNOSIS — I872 Venous insufficiency (chronic) (peripheral): Secondary | ICD-10-CM | POA: Diagnosis not present

## 2024-02-24 NOTE — Progress Notes (Signed)
 Remote Loop Recorder Transmission

## 2024-02-28 ENCOUNTER — Telehealth: Payer: Self-pay

## 2024-02-28 ENCOUNTER — Other Ambulatory Visit: Payer: Self-pay | Admitting: *Deleted

## 2024-02-28 DIAGNOSIS — I739 Peripheral vascular disease, unspecified: Secondary | ICD-10-CM

## 2024-02-28 MED ORDER — EZETIMIBE 10 MG PO TABS: 10.0000 mg | ORAL_TABLET | Freq: Every day | ORAL | 3 refills | Status: AC

## 2024-02-28 NOTE — Telephone Encounter (Signed)
 Paralee from [place] calling for nursing verbal orders as follows:  1 time(s) weekly for 3 week(s)  Verbal orders given per [facility] protocol  Chiquita JAYSON English, RN

## 2024-03-05 ENCOUNTER — Ambulatory Visit

## 2024-03-05 ENCOUNTER — Telehealth: Payer: Self-pay | Admitting: Family Medicine

## 2024-03-05 ENCOUNTER — Ambulatory Visit: Admitting: Family Medicine

## 2024-03-05 VITALS — BP 193/60 | HR 59 | Wt 149.8 lb

## 2024-03-05 DIAGNOSIS — Z139 Encounter for screening, unspecified: Secondary | ICD-10-CM

## 2024-03-05 DIAGNOSIS — I739 Peripheral vascular disease, unspecified: Secondary | ICD-10-CM

## 2024-03-05 DIAGNOSIS — Z23 Encounter for immunization: Secondary | ICD-10-CM

## 2024-03-05 NOTE — Assessment & Plan Note (Signed)
 Exam today without signs of infection. Normal activity.  - Discussed DuoDerm x1 week for small areas of open wound - Continue supportive care

## 2024-03-05 NOTE — Telephone Encounter (Signed)
 Received form to sign for venous stasis ulcer/ulcer on tibia. Patient does not have visit until 10/20. Please call and offer earlier visit for this to be assessed.  Suzann Daring, MD  Family Medicine Teaching Service

## 2024-03-05 NOTE — Patient Instructions (Addendum)
 Thank you for visiting clinic today and allowing us  to participate in your care!  Please use the DuoDerm bandage on the open areas of your legs for up to 1 week. Remove the bandage after 1 week.   We placed a referral to social work for further discussion about community resources.   Please schedule an appointment with your PCP if your legs worsen or you have any other concerns.   Reach out any time with any questions or concerns you may have - we are here for you!  Damien Cassis, MD [facility] [phone]

## 2024-03-05 NOTE — Telephone Encounter (Signed)
 Pt scheduled this afternoon in ATC. Takeria Marquina [place], CMA

## 2024-03-05 NOTE — Progress Notes (Signed)
    SUBJECTIVE:   CHIEF COMPLAINT / HPI:   Venous stasis of BLE Presenting today to follow up on venous stasis of legs. Overall legs have been improving. Not painful. Moving around as usual. Feels at baseline overall.   Healthcare planning  Interested in daughter being POA. Interested in possibly moving to assisted living facility. Cost can be a barrier.   PERTINENT  PMH / PSH: PVD, Hx CVA, HFpEF, Isolated systolic HTN, T2DM  OBJECTIVE:   BP (!) 193/60   Pulse (!) 59   Wt 149 lb 12.8 oz (67.9 kg)   SpO2 100%   BMI 25.71 kg/m   General: No acute distress. Resting comfortably in room. Pulm: Breathing comfortably on room air. No increased WOB. Skin:  BLE with 1+ pitting edema to mid-shin, evidence of skin breakdown without significant induration, erythema, increased warmth, active drainage, or tenderness. Bilateral feet without evidence of skin breakdown.  Psych: Pleasant and appropriate.           ASSESSMENT/PLAN:   Assessment & Plan PVD (peripheral vascular disease) Exam today without signs of infection. Normal activity.  - Discussed DuoDerm x1 week for small areas of open wound - Continue supportive care Encounter for screening involving social determinants of health (SDoH) [facility] referral placed for further discussion regarding assisted living facilities and financial options.  Encounter for immunization Received annual flu and covid vaccine today.    RTC if symptoms worsen or other concerns arise.   Damien Cassis, MD [facility]

## 2024-03-06 ENCOUNTER — Telehealth: Payer: Self-pay | Admitting: *Deleted

## 2024-03-06 NOTE — Progress Notes (Signed)
 Complex Care Management Note Care Guide Note  03/06/2024 Name: Lauren Craig MRN: [id number] DOB: 11-03-1945   Complex Care Management Outreach Attempts: An unsuccessful telephone outreach was attempted today to offer the patient information about available complex care management services.  Follow Up Plan:  Additional outreach attempts will be made to offer the patient complex care management information and services.   Encounter Outcome:  No Answer  Harlene Satterfield  [facility]  [facility], [facility] Guide  Direct Dial: [phone]  Fax [phone]

## 2024-03-06 NOTE — Progress Notes (Signed)
 Complex Care Management Note  Care Guide Note 03/06/2024 Name: Sheryn Aldaz MRN: [id number] DOB: 1946/03/27  Terricka Onofrio is a 78 y.o. year old female who sees Delores Suzann HERO, MD for primary care. I reached out to Heron Hansen by phone today to offer complex care management services.  Ms. Reynoso was given information about Complex Care Management services today including:   The Complex Care Management services include support from the care team which includes your Nurse Care Manager, Clinical Social Worker, or Pharmacist.  The Complex Care Management team is here to help remove barriers to the health concerns and goals most important to you. Complex Care Management services are voluntary, and the patient may decline or stop services at any time by request to their care team member.   Complex Care Management Consent Status: Patient agreed to services and verbal consent obtained.   Follow up plan:  Telephone appointment with complex care management team member scheduled for:  03/08/24 with BSW and 03/14/24 with LCSW   Encounter Outcome:  Patient Scheduled  Harlene Satterfield  [facility] Health  [facility], [facility] Guide  Direct Dial: [phone]  Fax [phone]

## 2024-03-07 ENCOUNTER — Ambulatory Visit: Payer: PPO

## 2024-03-07 DIAGNOSIS — I639 Cerebral infarction, unspecified: Secondary | ICD-10-CM | POA: Diagnosis not present

## 2024-03-07 LAB — CUP PACEART REMOTE DEVICE CHECK
Date Time Interrogation Session: 20251008060710
Implantable Pulse Generator Implant Date: 20240102
Pulse Gen Model: 5000
Pulse Gen Serial Number: 511019975

## 2024-03-08 ENCOUNTER — Telehealth: Payer: Self-pay

## 2024-03-08 ENCOUNTER — Other Ambulatory Visit: Payer: Self-pay

## 2024-03-08 ENCOUNTER — Ambulatory Visit: Payer: Self-pay | Admitting: Cardiology

## 2024-03-08 NOTE — Patient Instructions (Signed)
 Visit Information  Thank you for taking time to visit with me today. Please don't hesitate to contact me if I can be of assistance to you before our next scheduled appointment.  Our next appointment is by telephone on 03/29/24 at 12:30 Please call the care guide team at [phone] if you need to cancel or reschedule your appointment.   Following is a copy of your care plan:   Goals Addressed             This Visit's Progress    BSW VBCI Social Work Care Plan       Problems:   Food Insecurity  and assisted living facility  CSW Clinical Goal(s):   Over the next 30 days the Caregiver POA will work with Child psychotherapist to address concerns related to placement and resources for food .  Interventions:  SW will email resources to [facility] for assisted living facilities and food.  Patient Goals/Self-Care Activities:  POA wil use resources SW sent to visit assisted living facilities and choose one.  Plan:   The care management team will reach out to the patient again over the next 15 days.        Please call the Suicide and Crisis Lifeline: 988 call the [place]  National Suicide Prevention Lifeline: (712)280-8374 or TTY: 404 795 4804 TTY 386-607-9971) to talk to a trained counselor call 1-800-273-TALK (toll free, 24 hour hotline) call 911 if you are experiencing a Mental Health or Behavioral Health Crisis or need someone to talk to.  Patient verbalizes understanding of instructions and care plan provided today and agrees to view in MyChart. Active MyChart status and patient understanding of how to access instructions and care plan via MyChart confirmed with patient.     Thersia Hoar, HEDWIG, MHA [place]  [facility] Social Worker, Population Health [phone]

## 2024-03-08 NOTE — Telephone Encounter (Signed)
 Lauren Craig PT with [facility] calls nurse line requesting treatment plan.   He reports he was in the home this morning and noted the DuoDerm on patients leg.  Advised DuoDerm was placed on 10/7 and should be removed on 10/14.  Advised no FU needed unless worsening symptoms.   Patient has a PCP apt scheduled for 11/3.  All questions answered.

## 2024-03-08 NOTE — Patient Outreach (Signed)
 Complex Care Management   Visit Note  03/08/2024  Name:  Lauren Craig MRN: [id number] DOB: 15-Oct-1945  Situation: Referral received for Complex Care Management related to SDOH Barriers:  Food insecurity Assisted living facilities I obtained verbal consent from POA.  Visit completed with POA  on the phone  Background:   Past Medical History:  Diagnosis Date   Anxiety disorder    Arthritis    hands, hips   C. 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 SW and poa agreed for resources for assisted living facilities and food to be emailed to [email or web address].  SDOH Interventions    Flowsheet Row Patient Outreach Telephone from 03/08/2024 in [place] POPULATION HEALTH DEPARTMENT ED to Hosp-Admission (Discharged) from 11/30/2023 in [facility] Care Coordination from 06/27/2023 in [facility] Community Care Coordination Care Coordination from 06/06/2023 in [facility] Community Care Coordination Patient Outreach from 05/29/2023 in [place] HEALTH POPULATION HEALTH DEPARTMENT Office Visit from 11/09/2022 in [facility]  SDOH Interventions        Food Insecurity Interventions Community Resources Provided -- Intervention Not Indicated Intervention Not Indicated Intervention Not Indicated --  Housing Interventions -- -- Intervention Not Indicated  Intervention Not Indicated Intervention Not Indicated --  Transportation Interventions -- Inpatient TOC, Patient Resources (Friends/Family), Intervention Not Indicated Intervention Not Indicated -- Intervention Not Indicated --  Utilities Interventions -- -- Intervention Not Indicated Intervention Not Indicated Intervention Not Indicated --  Alcohol Usage Interventions -- -- -- -- Intervention Not Indicated (Score <7) --  Depression Interventions/Treatment  -- -- -- -- -- Medication  Financial Strain Interventions -- -- -- -- Intervention Not Indicated --  Physical Activity Interventions -- -- -- -- Intervention Not Indicated --  Stress Interventions -- -- -- -- Patient Unable to Answer --  Social Connections Interventions -- -- -- -- Intervention Not Indicated --  Health Literacy Interventions -- -- -- -- Intervention Not Indicated --       Recommendation:   No recommendations at this time  Follow Up Plan:   Telephone follow-up 15 days  Lauren Craig, Lauren Craig, [facility] [place]  Value Based Care Institute Social Worker, Population Health [phone]

## 2024-03-11 NOTE — Progress Notes (Signed)
 Remote Loop Recorder Transmission

## 2024-03-12 NOTE — Progress Notes (Signed)
 Remote Loop Recorder Transmission

## 2024-03-14 ENCOUNTER — Telehealth: Payer: Self-pay | Admitting: Family Medicine

## 2024-03-14 ENCOUNTER — Other Ambulatory Visit: Payer: Self-pay | Admitting: Licensed Clinical Social Worker

## 2024-03-14 DIAGNOSIS — J310 Chronic rhinitis: Secondary | ICD-10-CM

## 2024-03-14 NOTE — Patient Outreach (Signed)
 Complex Care Management   Visit Note  03/14/2024  Name:  Lauren Craig MRN: [id number] DOB: [birth year]  Situation: Referral received for Complex Care Management related to Level of care- assisted living I obtained verbal consent from Patient.  Visit completed with Patient  on the phone  Background:   Past Medical History:  Diagnosis Date   Anxiety disorder    Arthritis    hands, hips   C. 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   Vitamin B12 deficiency     Assessment: Patient Reported Symptoms:  Cognitive Cognitive Status: Alert and oriented to person, place, and time, Normal speech and language skills Cognitive/Intellectual Conditions Management [RPT]: None reported or documented in medical history or problem list   Health Maintenance Behaviors: Annual physical exam, Healthy diet, Stress management Healing Pattern: Average Health Facilitated by: Stress management  Neurological Neurological Review of Symptoms: No symptoms reported    HEENT HEENT Symptoms Reported: No symptoms reported      Cardiovascular Cardiovascular Symptoms Reported: No symptoms reported (Has a heart monitor- no concerns) Does patient have uncontrolled Hypertension?: No    Respiratory Respiratory Symptoms Reported: No symptoms reported    Endocrine Endocrine Symptoms Reported: No symptoms reported    Gastrointestinal Gastrointestinal Symptoms Reported: Incontinence Additional Gastrointestinal Details: Bladder incontinence- adult diapers Gastrointestinal Management Strategies: Incontinence garment/pad Gastrointestinal Self-Management Outcome: 4 (good)    Genitourinary Genitourinary Symptoms Reported:  Incontinence Additional Genitourinary Details: Bladder incontinence- adult diapers Genitourinary Management Strategies: Incontinence garment/pad Genitourinary Self-Management Outcome: 4 (good)  Integumentary Integumentary Symptoms Reported: Skin changes Additional Integumentary Details: Ulcer on leg, blister on same leg and seeping liquid Skin Management Strategies: Dressing changes, Routine screening Skin Self-Management Outcome: 4 (good)  Musculoskeletal Musculoskelatal Symptoms Reviewed: Limited mobility, Difficulty walking Additional Musculoskeletal Details: Uses a walker and has a wheelchair for occasional use   Falls in the past year?: Yes Number of falls in past year: 2 or more Was there an injury with Fall?: No Fall Risk Category Calculator: 2 Patient Fall Risk Level: Moderate Fall Risk Patient at Risk for Falls Due to: Impaired balance/gait, Impaired mobility  Psychosocial Psychosocial Symptoms Reported: No symptoms reported     Quality of Family Relationships: supportive Do you feel physically threatened by others?: No    03/14/2024    PHQ2-9 Depression Screening   Little interest or pleasure in doing things Not at all  Feeling down, depressed, or hopeless Not at all  PHQ-2 - Total Score 0  Trouble falling or staying asleep, or sleeping too much    Feeling tired or having little energy    Poor appetite or overeating     Feeling bad about yourself - or that you are a failure or have let yourself or your family down    Trouble concentrating on things, such as reading the newspaper or watching television    Moving or speaking so slowly that other people could have noticed.  Or the opposite - being so fidgety or restless that you have been moving around a lot more than usual    Thoughts that you would be better off dead, or hurting yourself in some way    PHQ2-9 Total  Score    If you checked off any problems, how difficult have these problems made it for you to do your  work, take care of things at home, or get along with other people    Depression Interventions/Treatment      There were no vitals filed for this visit.  Medications Reviewed Today     Reviewed by Veva Bolt, LCSW (Social Worker) on 03/14/24 at [phone]  Med List Status: <None>   Medication Order Taking? Sig Documenting Provider Last Dose Status Informant  acetaminophen  (TYLENOL ) 325 MG tablet [id number]  Take 2 tablets (650 mg total) by mouth every 6 (six) hours as needed for headache. Malvina Ellen, MD  Active Child, Pharmacy Records           Med Note (SATTERFIELD, TEENA BRAVO   Thu Nov 30, 2023  8:54 PM)    albuterol  (VENTOLIN  HFA) 108 [phone] Base) MCG/ACT inhaler [id number]  Inhale 2 puffs into the lungs every 6 (six) hours as needed for wheezing or shortness of breath. Delores Suzann HERO, MD  Active   amLODipine  (NORVASC ) 10 MG tablet [id number]  Take 1 tablet (10 mg total) by mouth daily. Delores Suzann HERO, MD  Active   Apoaequorin (PREVAGEN PO) [id number]  Take 1 tablet by mouth daily. [provider]  Active Child, Pharmacy Records  aspirin  EC 81 MG tablet [id number]  Take 1 tablet (81 mg total) by mouth daily. Swallow whole. Delores Suzann HERO, MD  Active   cloNIDine  (CATAPRES ) 0.1 MG tablet [id number]  Take 1 tablet (0.1 mg total) by mouth 2 (two) times daily. Delores Suzann HERO, MD  Active   escitalopram  (LEXAPRO ) 20 MG tablet [id number]  Take 1 tablet (20 mg total) by mouth daily. Delores Suzann HERO, MD  Active   ezetimibe  (ZETIA ) 10 MG tablet [id number]  Take 1 tablet (10 mg total) by mouth daily. McDiarmid, Krystal BIRCH, MD  Active   famotidine  (PEPCID ) 10 MG tablet [id number]  Take 1 tablet (10 mg total) by mouth daily. Delores Suzann HERO, MD  Active   fexofenadine  (ALLEGRA  ALLERGY) 60 MG tablet [id number]  Take 1 tablet (60 mg total) by mouth 2 (two) times daily. Delores Suzann HERO, MD  Active   fluticasone  (FLONASE ) 50 MCG/ACT nasal spray [id number]  SPRAY 2 SPRAYS INTO EACH NOSTRIL EVERY DAY  Patient  taking differently: Place 1 spray into both nostrils daily as needed for allergies.   Delores Suzann HERO, MD  Active Child, Pharmacy Records  furosemide  (LASIX ) 20 MG tablet [id number]  Take 2 tablets (40 mg total) by mouth 2 (two) times daily. For swelling Delores Suzann HERO, MD  Active   hydrocortisone  2.5 % ointment [id number]  Apply topically 2 (two) times daily. On red areas on legs  Patient not taking: Reported on 01/09/2024   Jeanelle Layman CROME, MD  Active Child, Pharmacy Records           Med Note MAUDIE, EZUZM H   Tue Aug 29, 2023  2:27 PM) Last dose taken last week  Nystatin  (GERHARDT'S BUTT CREAM) CREA [id number]  Use twice daily in skin folds  Patient not taking: Reported on 01/09/2024   Delores Suzann HERO, MD  Active Child, Pharmacy Records  ondansetron  (ZOFRAN -ODT) 4 MG disintegrating tablet [id number]  Take 1 tablet (4 mg total) by mouth every 8 (eight) hours as needed for nausea or vomiting. Delores Suzann HERO, MD  Active   rosuvastatin  (CRESTOR ) 10 MG tablet [id number]  Take 1 tablet (10 mg total) by  mouth daily. Delores Suzann HERO, MD  Active   sodium zirconium cyclosilicate  (LOKELMA ) 10 g PACK packet [id number]  Take 10 g by mouth daily.  Patient not taking: Reported on 01/09/2024   Delores Suzann HERO, MD  Active   triamcinolone  (KENALOG ) 0.025 % ointment [id number]  Apply 1 Application topically 2 (two) times daily. Apply to right ankle rash  Patient not taking: Reported on 01/09/2024   Delores Suzann HERO, MD  Active Child, Pharmacy Records            Recommendation:   PCP Follow-up  Follow Up Plan:   Telephone follow up appointment date/time:  03/20/24 11:30am  Cena Ligas, LCSW Clinical Social Worker [facility]

## 2024-03-14 NOTE — Patient Instructions (Signed)
 Visit Information  Thank you for taking time to visit with me today. Please don't hesitate to contact me if I can be of assistance to you before our next scheduled appointment.  Our next appointment is by telephone on 03/20/24 at 1130am Please call the care guide team at [phone] if you need to cancel or reschedule your appointment.   Following is a copy of your care plan:   Goals Addressed             This Visit's Progress    [facility] Social [facility] Care Plan LCSW       Problems:   Level of Care Concerns:Facility placement (assisted living) Inability to perform ADL's independently  CSW Clinical Goal(s):   Over the next 30 days the Caregiver will identify at least 2-3 assisted living facilities as evidenced by selecting a facility that meets patients needs for safety, care, and quality of life.  Interventions:  Level of Care Concerns in a patient with Anxiety, CAD, and HTN Current level of care: retirement community Evaluation of patient's unmet needs in current living environment ADL's Discussed family support and building support system  Discussed private pay options for personal care needs   Facility  Assist with faxing FL2 to identified facilities once completed and signed by PCP Collaborated with primary care provider for completion of FL2 Discussed payment options for placement :private pay Emotional Support Provided Obtained verbal permission to fax FL2 and supporting document to facilities as well as obtain PASSAR   Advance Directive A voluntary discussion about advanced care planning including importance of advanced directives, healthcare proxy and living will was discussed.  Active listening / Reflection utilized Caregiver stress acknowledged :resources offered-declined Emotional Support Provided  Patient Goals/Self-Care Activities:  Continue taking your medication as prescribed.   Patients daughter Samule will identify AFL facilities that meet needs of  patient  Plan:   Telephone follow up appointment with care management team member scheduled for:  03/20/24 11:30am        Please call 911 if you are experiencing a Mental Health or Behavioral Health Crisis or need someone to talk to.  Patient verbalizes understanding of instructions and care plan provided today and agrees to view in MyChart. Active MyChart status and patient understanding of how to access instructions and care plan via MyChart confirmed with patient.     Cena Ligas, LCSW Clinical Social Worker [facility]

## 2024-03-14 NOTE — Telephone Encounter (Signed)
 Thank you--the daughter is requesting FL2. Are you able to assist with this? I am happy to complete the physician portion.

## 2024-03-14 NOTE — Telephone Encounter (Signed)
 Received below from Patient's daughter--Alexis are you able to reach out to the patient's daughter about this? I see she has a visit today  Good evening, Dr. Delores,  I am writing to let you know how Mrs. Heldman is.    I went to visit her tonight and gave her her clean laundry and set up her meds for the week.    I assessed her legs, which are still swollen and noticed that she's scratched the one little sore, causing it to bleed.    Also, she has a blister that is leaking fluid. This is her left leg.    I put the bandages Dr. Diona gave us  on her leg and will let you assess her on Monday when I bring her.    I am currently working with [employer] to find her assisted living placement.    My biggest concern is her very tight budget to get her in a private pay situation.    If I had a house or apartment, I'd move her in and get her nursing care. But I don't and this is beyond frustrating because I don't have the money to supplement her income.    Keep us  in prayer.    See you and the team Monday.

## 2024-03-15 ENCOUNTER — Telehealth: Payer: Self-pay | Admitting: Family Medicine

## 2024-03-15 ENCOUNTER — Emergency Department (HOSPITAL_COMMUNITY)

## 2024-03-15 ENCOUNTER — Emergency Department (HOSPITAL_COMMUNITY)
Admission: EM | Admit: 2024-03-15 | Discharge: 2024-03-16 | Disposition: A | Discharge status: Home / Self Care | Attending: Emergency Medicine | Admitting: Emergency Medicine

## 2024-03-15 ENCOUNTER — Encounter (HOSPITAL_COMMUNITY): Payer: Self-pay | Admitting: *Deleted

## 2024-03-15 ENCOUNTER — Other Ambulatory Visit: Payer: Self-pay

## 2024-03-15 DIAGNOSIS — R600 Localized edema: Secondary | ICD-10-CM | POA: Diagnosis not present

## 2024-03-15 DIAGNOSIS — E1136 Type 2 diabetes mellitus with diabetic cataract: Secondary | ICD-10-CM | POA: Diagnosis not present

## 2024-03-15 DIAGNOSIS — Z8673 Personal history of transient ischemic attack (TIA), and cerebral infarction without residual deficits: Secondary | ICD-10-CM | POA: Diagnosis not present

## 2024-03-15 DIAGNOSIS — R0602 Shortness of breath: Secondary | ICD-10-CM | POA: Diagnosis not present

## 2024-03-15 DIAGNOSIS — N189 Chronic kidney disease, unspecified: Secondary | ICD-10-CM | POA: Diagnosis not present

## 2024-03-15 DIAGNOSIS — I130 Hypertensive heart and chronic kidney disease with heart failure and stage 1 through stage 4 chronic kidney disease, or unspecified chronic kidney disease: Secondary | ICD-10-CM | POA: Diagnosis not present

## 2024-03-15 DIAGNOSIS — M7989 Other specified soft tissue disorders: Secondary | ICD-10-CM | POA: Diagnosis not present

## 2024-03-15 DIAGNOSIS — Z79899 Other long term (current) drug therapy: Secondary | ICD-10-CM | POA: Insufficient documentation

## 2024-03-15 DIAGNOSIS — M11262 Other chondrocalcinosis, left knee: Secondary | ICD-10-CM | POA: Diagnosis not present

## 2024-03-15 DIAGNOSIS — I509 Heart failure, unspecified: Secondary | ICD-10-CM | POA: Insufficient documentation

## 2024-03-15 DIAGNOSIS — Z7982 Long term (current) use of aspirin: Secondary | ICD-10-CM | POA: Diagnosis not present

## 2024-03-15 DIAGNOSIS — E1122 Type 2 diabetes mellitus with diabetic chronic kidney disease: Secondary | ICD-10-CM | POA: Insufficient documentation

## 2024-03-15 DIAGNOSIS — R079 Chest pain, unspecified: Secondary | ICD-10-CM | POA: Diagnosis present

## 2024-03-15 DIAGNOSIS — L03116 Cellulitis of left lower limb: Secondary | ICD-10-CM | POA: Diagnosis not present

## 2024-03-15 DIAGNOSIS — M79605 Pain in left leg: Secondary | ICD-10-CM | POA: Diagnosis not present

## 2024-03-15 DIAGNOSIS — I10 Essential (primary) hypertension: Secondary | ICD-10-CM | POA: Insufficient documentation

## 2024-03-15 LAB — BASIC METABOLIC PANEL WITH GFR
Anion gap: 12 (ref 5–15)
BUN: 49 mg/dL — ABNORMAL HIGH (ref 8–23)
CO2: 22 mmol/L (ref 22–32)
Calcium: 8.4 mg/dL — ABNORMAL LOW (ref 8.9–10.3)
Chloride: 103 mmol/L (ref 98–111)
Creatinine, Ser: 2.18 mg/dL — ABNORMAL HIGH (ref 0.44–1.00)
GFR, Estimated: 22 mL/min — ABNORMAL LOW (ref >60)
Glucose, Bld: 142 mg/dL — ABNORMAL HIGH (ref 70–99)
Potassium: 4.9 mmol/L (ref 3.5–5.1)
Sodium: 137 mmol/L (ref 135–145)

## 2024-03-15 LAB — CBC
HCT: 30 % — ABNORMAL LOW (ref 36.0–46.0)
Hemoglobin: 9.1 g/dL — ABNORMAL LOW (ref 12.0–15.0)
MCH: 25.6 pg — ABNORMAL LOW (ref 26.0–34.0)
MCHC: 30.3 g/dL (ref 30.0–36.0)
MCV: 84.5 fL (ref 80.0–100.0)
Platelets: 278 K/uL (ref 150–400)
RBC: 3.55 MIL/uL — ABNORMAL LOW (ref 3.87–5.11)
RDW: 15 % (ref 11.5–15.5)
WBC: 6.5 K/uL (ref 4.0–10.5)
nRBC: 0 % (ref 0.0–0.2)

## 2024-03-15 LAB — BRAIN NATRIURETIC PEPTIDE: B Natriuretic Peptide: 101.4 pg/mL — ABNORMAL HIGH (ref 0.0–100.0)

## 2024-03-15 LAB — TROPONIN I (HIGH SENSITIVITY)
Troponin I (High Sensitivity): 13 ng/L (ref <18)
Troponin I (High Sensitivity): 12 ng/L (ref <18)

## 2024-03-15 NOTE — ED Triage Notes (Signed)
 Blisters on her lt leg with pain

## 2024-03-15 NOTE — Telephone Encounter (Signed)
 FL2 completed. Please attach medication list. Reviewed, completed, and signed form.  Note routed to RN team inbasket and placed completed form in RN Wall pocket in the front office. Miranda--should we send back to you once medication list attached?  Suzann CHRISTELLA Daring, MD

## 2024-03-15 NOTE — ED Triage Notes (Signed)
 Pt arrives via POV. Pt c/o a wound to her left leg for the past week.

## 2024-03-15 NOTE — ED Provider Triage Note (Signed)
 Emergency Medicine Provider Triage Evaluation Note  Lauren Craig , a 78 y.o. female  was evaluated in triage.  Pt complains of wound on left leg, dysuria, chest pain, shob. Lives in retirement facility, does not see doctor regularly. HX of heart failure, diabetes, CVA, CKD. Leg wound is weeping.  Review of Systems  Positive: Chest pain, shob, leg wound, dysuria Negative:   Physical Exam  BP (!) 169/47   Pulse (!) 56   Temp 98.6 F (37 C)   Resp 16   Ht 5' 4 (1.626 m)   Wt 67.9 kg   SpO2 94%   BMI 25.69 kg/m  Gen:   Awake, no distress   Resp:  Normal effort  MSK:   Moves extremities without difficulty  Other:  Weeping, red, foul smelling ulcer on left lower extremity  Medical Decision Making  Medically screening exam initiated at 4:58 PM.  Appropriate orders placed.  Lauren Craig was informed that the remainder of the evaluation will be completed by another provider, this initial triage assessment does not replace that evaluation, and the importance of remaining in the ED until their evaluation is complete.  Workup initiated in triage    Lauren Craig, [place] 03/15/24 1658

## 2024-03-16 MED ORDER — DOXYCYCLINE HYCLATE 100 MG PO CAPS: 100.0000 mg | ORAL_CAPSULE | Freq: Two times a day (BID) | ORAL | 0 refills | Status: DC

## 2024-03-16 MED ORDER — DOXYCYCLINE HYCLATE 100 MG PO TABS
100.0000 mg | ORAL_TABLET | Freq: Once | ORAL | Status: AC
  Administered 2024-03-16: 100 mg via ORAL
  Filled 2024-03-16: qty 1

## 2024-03-16 NOTE — ED Provider Notes (Signed)
 [place] EMERGENCY DEPARTMENT AT [facility] Provider Note   CSN: [id number] Arrival date & time: 03/15/24  1618     Patient presents with: Wound Check and Leg Pain   Lauren Craig is a 78 y.o. female.   HPI     This is a 78 year old female who presents with concern for leg wounds.  Patient lives in assisted living.  States she has noted redness and wound to the left lower extremity.  She states that her daughter has been cleaning her wounds daily.  She not had any fevers.  Has had some leg swelling and weeping.  She denies any chest pain or shortness of breath to me but endorsed this to the [facility] provider.  Denies fevers.  Prior to Admission medications   Medication Sig Start Date End Date Taking? Authorizing Provider  doxycycline  (VIBRAMYCIN ) 100 MG capsule Take 1 capsule (100 mg total) by mouth 2 (two) times daily. 03/16/24  Yes Ellason Segar, Charmaine FALCON, MD  acetaminophen  (TYLENOL ) 325 MG tablet Take 2 tablets (650 mg total) by mouth every 6 (six) hours as needed for headache. 11/01/20   Malvina Ellen, MD  albuterol  (VENTOLIN  HFA) 108 (90 Base) MCG/ACT inhaler Inhale 2 puffs into the lungs every 6 (six) hours as needed for wheezing or shortness of breath. 01/16/24   Delores Suzann HERO, MD  amLODipine  (NORVASC ) 10 MG tablet Take 1 tablet (10 mg total) by mouth daily. 01/16/24   Delores Suzann HERO, MD  Apoaequorin (PREVAGEN PO) Take 1 tablet by mouth daily.    [provider]  aspirin  EC 81 MG tablet Take 1 tablet (81 mg total) by mouth daily. Swallow whole. 01/16/24   Delores Suzann HERO, MD  cloNIDine  (CATAPRES ) 0.1 MG tablet Take 1 tablet (0.1 mg total) by mouth 2 (two) times daily. 02/15/24   Delores Suzann HERO, MD  escitalopram  (LEXAPRO ) 20 MG tablet Take 1 tablet (20 mg total) by mouth daily. 01/16/24   Delores Suzann HERO, MD  ezetimibe  (ZETIA ) 10 MG tablet Take 1 tablet (10 mg total) by mouth daily. 02/28/24   McDiarmid, Krystal BIRCH, MD  famotidine  (PEPCID ) 10 MG tablet Take 1 tablet (10  mg total) by mouth daily. 02/02/24   Delores Suzann HERO, MD  fexofenadine  (ALLEGRA  ALLERGY) 60 MG tablet Take 1 tablet (60 mg total) by mouth 2 (two) times daily. 01/16/24   Delores Suzann HERO, MD  fluticasone  (FLONASE ) 50 MCG/ACT nasal spray SPRAY 2 SPRAYS INTO EACH NOSTRIL EVERY DAY 10/19/23   Delores Suzann HERO, MD  furosemide  (LASIX ) 20 MG tablet Take 2 tablets (40 mg total) by mouth 2 (two) times daily. For swelling 02/02/24   Delores Suzann HERO, MD  hydrocortisone  2.5 % ointment Apply topically 2 (two) times daily. On red areas on legs Patient not taking: Reported on 01/09/2024 11/09/22   Jeanelle Layman CROME, MD  Nystatin  (GERHARDT'S BUTT CREAM) CREA Use twice daily in skin folds Patient not taking: Reported on 01/09/2024 11/27/23   Delores Suzann HERO, MD  ondansetron  (ZOFRAN -ODT) 4 MG disintegrating tablet Take 1 tablet (4 mg total) by mouth every 8 (eight) hours as needed for nausea or vomiting. 01/16/24   Delores Suzann HERO, MD  rosuvastatin  (CRESTOR ) 10 MG tablet Take 1 tablet (10 mg total) by mouth daily. 01/16/24   Delores Suzann HERO, MD  sodium zirconium cyclosilicate  (LOKELMA ) 10 g PACK packet Take 10 g by mouth daily. Patient not taking: Reported on 01/09/2024 12/12/23   Delores Suzann HERO, MD  triamcinolone  (KENALOG ) 0.025 %  ointment Apply 1 Application topically 2 (two) times daily. Apply to right ankle rash Patient not taking: Reported on 01/09/2024 05/10/23   Delores Suzann HERO, MD    Allergies: Penicillins, Insulin  lispro, Insulin  lispro prot & lispro, Semaglutide, and Metformin    Review of Systems  Constitutional:  Negative for fever.  Respiratory:  Negative for shortness of breath.   Cardiovascular:  Negative for chest pain.  Skin:  Positive for color change and wound.  All other systems reviewed and are negative.   Updated Vital Signs BP (!) 169/60   Pulse (!) 51   Temp 98.7 F (37.1 C) (Oral)   Resp 18   Ht 1.626 m (5' 4)   Wt 67.9 kg   SpO2 98%   BMI 25.69 kg/m   Physical Exam Vitals and  nursing note reviewed.  Constitutional:      Appearance: She is well-developed.     Comments: Chronically ill-appearing but nontoxic  HENT:     Head: Normocephalic and atraumatic.  Eyes:     Pupils: Pupils are equal, round, and reactive to light.  Cardiovascular:     Rate and Rhythm: Normal rate and regular rhythm.     Heart sounds: Normal heart sounds.  Pulmonary:     Effort: Pulmonary effort is normal. No respiratory distress.     Breath sounds: No wheezing.  Abdominal:     Palpations: Abdomen is soft.  Musculoskeletal:     Cervical back: Neck supple.     Comments: Erythema and dry skin noted to the bilateral lower extremity, left greater than right with weeping wounds on the left, 1+ edema bilaterally, 1+ pulse DP bilaterally, well-perfused distally  Skin:    General: Skin is warm and dry.  Neurological:     Mental Status: She is alert and oriented to person, place, and time.         (all labs ordered are listed, but only abnormal results are displayed) Labs Reviewed  BASIC METABOLIC PANEL WITH GFR - Abnormal; Notable for the following components:      Result Value   Glucose, Bld 142 (*)    BUN 49 (*)    Creatinine, Ser 2.18 (*)    Calcium  8.4 (*)    GFR, Estimated 22 (*)    All other components within normal limits  CBC - Abnormal; Notable for the following components:   RBC 3.55 (*)    Hemoglobin 9.1 (*)    HCT 30.0 (*)    MCH 25.6 (*)    All other components within normal limits  BRAIN NATRIURETIC PEPTIDE - Abnormal; Notable for the following components:   B Natriuretic Peptide 101.4 (*)    All other components within normal limits  TROPONIN I (HIGH SENSITIVITY)  TROPONIN I (HIGH SENSITIVITY)    EKG: None  Radiology: DG Tibia/Fibula Left Result Date: 03/15/2024 EXAM: 2 VIEW(S) XRAY OF THE LEFT TIBIA AND FIBULA 03/15/2024 05:42:00 PM COMPARISON: The 11 / 1 / 22 CLINICAL HISTORY: Wound Check; Leg Pain, SHOB FINDINGS: BONES AND JOINTS: No acute fracture.  No focal osseous lesion. Chondrocalcinosis of the knee. No joint dislocation. SOFT TISSUES: Vascular calcifications. Mild soft tissue edema. IMPRESSION: 1. No acute osseous abnormality. 2. Chondrocalcinosis of the knee. Electronically signed by: Norman Gatlin MD 03/15/2024 06:28 PM EDT RP Workstation: [id number]   DG Chest 2 View Result Date: 03/15/2024 CLINICAL DATA:  Short of breath, leg pain EXAM: CHEST - 2 VIEW COMPARISON:  11/30/2023 FINDINGS: Frontal and lateral views of the chest  demonstrate stable enlargement of the cardiac silhouette. No acute airspace disease, effusion, or pneumothorax. No acute bony abnormalities. IMPRESSION: 1. No acute intrathoracic process. Electronically Signed   By: Ozell Daring M.D.   On: 03/15/2024 18:28     Procedures   Medications Ordered in the ED  doxycycline  (VIBRA -TABS) tablet 100 mg (100 mg Oral Given 03/16/24 0537)                                    Medical Decision Making Risk Prescription drug management.   This patient presents to the ED for concern of lower extremity wound and redness, this involves an extensive number of treatment options, and is a complaint that carries with it a high risk of complications and morbidity.  I considered the following differential and admission for this acute, potentially life threatening condition.  The differential diagnosis includes cellulitis, deeper infection such as osteomyelitis or necrotizing infection, pressure ulcers, venous stasis, vascular insufficiency  MDM:    This is a 78 year old female who presents with lower extremity wounds and redness.  She is overall nontoxic and afebrile.  Vital signs are reassuring.  Labs obtained and reviewed and largely reassuring without any significant change from baseline.  No leukocytosis.  Hemoglobin is at baseline.  Creatinine is 2.18 which is also at baseline.  Troponins are flat at 12.  Chest x-ray without pneumothorax or pneumonia.  X-ray does not show any  gas in the tissues.  Suspect at least cellulitis.  Unclear what the etiology of the wounds are but I suspect they are more chronic than reported.  Likely venous insufficiency.  Will trial doxycycline .  She does not appear to have any significant systemic symptoms and feel she can trial outpatient antibiotics at this point.  (Labs, imaging, consults)  Labs: I Ordered, and personally interpreted labs.  The pertinent results include: CBC, BMP, troponin x 2  Imaging Studies ordered: I ordered imaging studies including chest x-ray, x-ray tib-fib I independently visualized and interpreted imaging. I agree with the radiologist interpretation  Additional history obtained from chart review.  External records from outside source obtained and reviewed including prior evaluations  Cardiac Monitoring: The patient was maintained on a cardiac monitor.  If on the cardiac monitor, I personally viewed and interpreted the cardiac monitored which showed an underlying rhythm of: Sinus  Reevaluation: After the interventions noted above, I reevaluated the patient and found that they have :improved  Social Determinants of Health:  lives in an assisted living  Disposition: Discharge  Co morbidities that complicate the patient evaluation  Past Medical History:  Diagnosis Date   Anxiety disorder    Arthritis    hands, hips   C. 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     Dispense:  20 capsule    Refill:  0  I have reviewed the patients home medicines and have made adjustments as needed  Problem List / ED Course: Problem List Items Addressed This Visit   None Visit Diagnoses       Cellulitis of left lower extremity    -  Primary                Final diagnoses:  Cellulitis of left lower extremity    ED Discharge Orders          Ordered    doxycycline  (VIBRAMYCIN ) 100 MG capsule  2 times daily        03/16/24 0639               Bari Charmaine FALCON, MD 03/16/24 [phone]

## 2024-03-16 NOTE — ED Notes (Signed)
 Spoke w/pt's daughter, pickup time will be around 1115.

## 2024-03-16 NOTE — Discharge Instructions (Signed)
 You were seen today for redness and weeping of the lower leg.  This is likely related to infection and cellulitis.  Take doxycycline .  If you notice fevers, increasing redness, worsening of symptoms, you need to be reevaluated.

## 2024-03-16 NOTE — ED Notes (Signed)
 Called daughter to confirm pickup time, no answer, left VM.

## 2024-03-16 NOTE — ED Notes (Signed)
 Patient called her daughter she will be coming to get her. Wound care done to left lower leg.

## 2024-03-18 ENCOUNTER — Ambulatory Visit: Admitting: Family Medicine

## 2024-03-18 ENCOUNTER — Encounter: Payer: Self-pay | Admitting: Student

## 2024-03-18 ENCOUNTER — Ambulatory Visit (INDEPENDENT_AMBULATORY_CARE_PROVIDER_SITE_OTHER): Admitting: Student

## 2024-03-18 VITALS — BP 201/63 | HR 64 | Wt 156.2 lb

## 2024-03-18 DIAGNOSIS — I10 Essential (primary) hypertension: Secondary | ICD-10-CM | POA: Diagnosis not present

## 2024-03-18 DIAGNOSIS — M7989 Other specified soft tissue disorders: Secondary | ICD-10-CM

## 2024-03-18 DIAGNOSIS — R600 Localized edema: Secondary | ICD-10-CM | POA: Diagnosis not present

## 2024-03-18 NOTE — Assessment & Plan Note (Signed)
 Much improved on Lasix . Ordered BM given patient isn currently on Lasix  and history of advanced kidney disease. Will follow up with nephrology. Recommend keeping legs elevated and use of compression stockings once LLE wound is much improved.

## 2024-03-18 NOTE — Patient Instructions (Addendum)
 Pleasure to see you today.  Glad to hear that the swellings in your legs have improved.  Your echocardiogram in the hospital showed normal ejection fraction (amount of blood you had pumps out) between 60-65%.  There also be sure that your left ventricular-year-old is slower to relax after contraction.  Today have ordered labs to check your electrolytes and kidney function.  Please discuss with your kidney doctor about recently starting Lasix .  Follow-up with PCP in 2-3  Below is the contact information for the heart doctor.  You can call them with the number below to schedule an appointment  [facility] [address] [place], [place] [address] [phone]

## 2024-03-18 NOTE — Assessment & Plan Note (Addendum)
 Poorly controlled. Suspect noncompliance, patient could be forgetting to take her medication. Patient is currently asymptomatic and given advance renal disease BP managed by nephrologist. Encouraged medication adherence and home BP monitoring. Patient advised to follow up with nephrology within a week for BP management and recent hospitalization follow up. Might need change in BP meds given LE edema. Encourage home BP monitoring, recommend close follow up with PCP in 2 weeks to discuss BP management. Strict ED precaution discussed with patient and daughter

## 2024-03-18 NOTE — Progress Notes (Addendum)
    SUBJECTIVE:   CHIEF COMPLAINT / HPI:   78 year old female with history of  Presenting today with daughter for hospital follow up Seen in the hospital on 9/2 for LE edema with assoicated pain (L>R) CXR obtained showed cardiomegaly with mild pulmonary edema  Initial lab include BNP 169 Patient was started on Lasix  40mg  BID Echo obtained showed  EF 60-65% with prolonged LV relaxation Both daughter and patient endorses significant improvement in LE edema since discharge from the hospital Patient also noted to lave LLE wound and sent home on doxycyclin. She denies any worsening pain or redness of the wound. Daughter unsure of medication compliance but report family fired an aid who gives patient her morning meds Patient currently lives at a retirement facility, however family is looking to transfer to an assisted living facility Follow outpatient nephrology with Dr. Norine   PERTINENT  PMH / PSH: Reviewed   OBJECTIVE:   BP (!) 201/63   Pulse 64   Wt 156 lb 3.2 oz (70.9 kg)   SpO2 99%   BMI 26.81 kg/m    Physical Exam General: Alert, elderly, frail, NAD Cardiovascular: RRR, No Murmurs, Normal S2/S2 Respiratory: CTAB, No wheezing or Rales Abdomen: No distension or tenderness Neuro: Alert and oriented, sitting on wheel chair, no focal deficit  Extremities: +1 pitting edema, LLE with healing erythemous wound without drainage .  ASSESSMENT/PLAN:   Hypertension Poorly controlled. Suspect noncompliance, patient could be forgetting to take her medication. Patient is currently asymptomatic and given advance renal disease BP managed by nephrologist. Encouraged medication adherence and home BP monitoring. Patient advised to follow up with nephrology very soon for BP management and recent hospitalization follow up. Might need change in BP meds given LE edema.  Leg swelling Much improved on Lasix . Ordered BM given patient isn currently on Lasix  and history of advanced kidney disease.  Will follow up with nephrology. Recommend keeping legs elevated and use of compression stockings once LLE wound is much improved.   Left leg wound.  Not draining at this time. Patient encouraged to complete her course of doxycycline  prescribed at the hospital. Redressed her wound with dermafilm dressing and provided patient with extra  dressing samples to be used at home.  Addendum: Spoke with patient's daughter who is her caregiver about her elevated blood pressure.  Daughter discussed that BP is usually ED is high.  However also patient has not been compliant with medication.  She is unable to get appointment with your nephrologist now and unable to follow-up with [facility] in the next few days as daughter who usually bring patients to our visit will be out of town.  They will make sure to be compliant with medication and monitor BP closely and if elevated return precautions discussed.  Which daughter verbalized understanding agreeable to plan.  Norleen April, MD [facility]

## 2024-03-18 NOTE — Telephone Encounter (Signed)
 Received FL2. Med list printed and attached.   I was unable to find fax or email for social worker.   Please advise the best method for sending this back.   Chiquita JAYSON English, RN

## 2024-03-19 LAB — BASIC METABOLIC PANEL WITH GFR
BUN/Creatinine Ratio: 22 (ref 12–28)
BUN: 40 mg/dL — ABNORMAL HIGH (ref 8–27)
CO2: 16 mmol/L — ABNORMAL LOW (ref 20–29)
Calcium: 9.2 mg/dL (ref 8.7–10.3)
Chloride: 104 mmol/L (ref 96–106)
Creatinine, Ser: 1.85 mg/dL — ABNORMAL HIGH (ref 0.57–1.00)
Glucose: 120 mg/dL — ABNORMAL HIGH (ref 70–99)
Potassium: 5.2 mmol/L (ref 3.5–5.2)
Sodium: 138 mmol/L (ref 134–144)
eGFR: 27 mL/min/1.73 — ABNORMAL LOW (ref >59)

## 2024-03-19 NOTE — Telephone Encounter (Signed)
 Daughter sent message via rhona to check status of paperwork.   Daughter requests to pick up copy from front desk.   Form and med list placed at front desk for pick up.   Copy made and placed in batch scanning.   Chiquita JAYSON English, RN

## 2024-03-20 ENCOUNTER — Encounter: Payer: Self-pay | Admitting: Licensed Clinical Social Worker

## 2024-03-20 ENCOUNTER — Telehealth: Payer: Self-pay | Admitting: Licensed Clinical Social Worker

## 2024-03-20 NOTE — Patient Outreach (Signed)
 Heron Hansen - I am sorry I was unable to reach you today for our scheduled appointment. I work with Delores Suzann HERO, MD and am calling to support your healthcare needs. Please contact me at [phone] at your earliest convenience. I look forward to speaking with you soon.   Thank you,  Cena Ligas, LCSW Clinical Social Worker [facility] Population Health

## 2024-03-21 ENCOUNTER — Telehealth: Payer: Self-pay

## 2024-03-21 NOTE — Telephone Encounter (Signed)
-----   Message from Norleen April sent at 03/20/2024  9:27 AM EDT ----- Regarding: BP follow up Dr. Anders wants us  to call patient about her BP and see if they were able to schedule appointment with her nephrologist within a week and if not Dr. Anders wants her to be scheduled to be seen this week for BP in our clinic. Please let me know once you've done this so I can addend my note. Thanks

## 2024-03-21 NOTE — Telephone Encounter (Signed)
 Called patient and her daughter twice no answer, will try again later. Cassell Mary CMA

## 2024-03-21 NOTE — Telephone Encounter (Signed)
 Received call from [place], RN at [facility] regarding wound care orders.   Patient reports that Duoderm was supposed to stay on wound until Monday. RN wanted to clarify this information is correct and that patient does not need to be performing additional wound care.   She is also requesting skilled nursing orders for one time weekly for two weeks. Will call RN back to provide verbal orders once clarification is provided by Dr. Rosendo.   Chiquita JAYSON English, RN

## 2024-03-25 NOTE — Telephone Encounter (Signed)
 Paralee returned call to nurse line.   Provided with verbal orders per Dr. Rosendo.   Chiquita JAYSON English, RN

## 2024-03-25 NOTE — Telephone Encounter (Signed)
 Spoke with Dr. Rosendo who gave verbal orders for nursing. Advised that Duoderm could remain in place for 5-7 days, then be removed.   Attempted to reach [place] at provided number. She did not answer, VM was generic and unable to verify if this was secure. Will attempt to reach [place] at later time.   Chiquita JAYSON English, RN

## 2024-03-28 NOTE — Progress Notes (Signed)
    SUBJECTIVE:   CHIEF COMPLAINT: check up on legs HPI:   Lauren Craig is a 78 y.o.  with history notable for CVA, memory changes, well controlled diabetes, HTN presenting for follow up.   She is joined by her daughter today.  The patient was recently evicted from her retirement community.  Since arriving home with her daughter there have been significant health issues.  Per Heron she has been vomiting for several weeks.  Over the last 3 days this is worsening.  She is vomiting up to 4 or 5 times a day.  She already had 3 bouts of emesis today.  She reports severe epigastric pain.  She did have a normal bowel movement in the toilet.  She denies chest pain melena or hematochezia.  The emesis appears bilious.  She has been unable able to tolerate fluids for the last 24 hours.  She also has a history of CKD 4.  Samule reports she has been giving her medications as they were previously prescribed.  She has not tried anything for nausea.   PERTINENT  PMH / PSH/Family/Social History : HTN with low diastolic value and elevated systolic value, CKD, well controlled type 2 DM   OBJECTIVE:   BP (!) 159/84   Pulse 85   Ht 5' 4 (1.626 m)   SpO2 100%   BMI 26.81 kg/m   Today's weight:  Review of prior weights: Filed Weights   Very ill-appearing woman in mild distress.  She is actively vomiting in the exam room.  Cardiac exam regular rate and rhythm.  Lungs clear bilaterally.  Abdomen is soft but tender in the mid epigastric area. 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 These all represent high risk conditions that require emergency department evaluation.    EMS called EMS transported patient to [facility] ED  Of note, the patient has had progressive memory decline and daughter is seeking placement in long term care for her.  Daughter also has her own health conditions which preclude her from caring for her mother at her apartment.     Suzann Daring, MD  Family Medicine Teaching Service  [facility]

## 2024-03-29 ENCOUNTER — Telehealth: Payer: Self-pay | Admitting: Family Medicine

## 2024-03-29 ENCOUNTER — Other Ambulatory Visit: Payer: Self-pay

## 2024-03-29 NOTE — Telephone Encounter (Signed)
 Verified Miranda's email.   Sent Secure email to [place].   Chiquita JAYSON English, RN

## 2024-03-29 NOTE — Patient Instructions (Signed)
 Visit Information  Thank you for taking time to visit with me today. Please don't hesitate to contact me if I can be of assistance to you before our next scheduled appointment.  Your next care management appointment is by telephone on 04/15/24 at 1pm    Please call the care guide team at [phone] if you need to cancel, schedule, or reschedule an appointment.   Please call the Suicide and Crisis Lifeline: 988 call the [place]  National Suicide Prevention Lifeline: (831)823-8496 or TTY: 608-176-6622 TTY 612-323-7259) to talk to a trained counselor call 1-800-273-TALK (toll free, 24 hour hotline) call 911 if you are experiencing a Mental Health or Behavioral Health Crisis or need someone to talk to.  Thersia Hoar, HEDWIG, MHA [place]  [facility] Social Worker, Population Health [phone]

## 2024-03-29 NOTE — Patient Outreach (Signed)
 Social Drivers of Health  Community Resource and Care Coordination Visit Note   03/29/2024  Name: Lauren Craig MRN: [id number] DOB:[birth year]  Situation: Referral received for [facility] needs assessment and assistance related to LTC. I obtained verbal consent from POA.  Visit completed with POA on the phone.   Background:      Assessment:   Goals Addressed             This Visit's Progress    BSW [facility] Social Work Care Plan       Problems:   Food Insecurity  and assisted living facility  CSW Clinical Goal(s):   Over the next 30 days the Caregiver POA will work with Child Psychotherapist to address concerns related to placement and resources for food .  Interventions:  SW will email resources to [facility] for assisted living facilities and food.SW provided website of Medicare.gov for daughter to look up LTC facilities.  Patient Goals/Self-Care Activities:  POA wil use resources SW sent to visit assisted living facilities and choose one.  Plan:   The care management team will reach out to the patient again over the next 15 days.        Recommendation:   Patient will stay with daughter until LTC is located.  Follow Up Plan:   Telephone follow-up 04/15/24 at 1pm  Thersia Hoar, BSW, [facility] [place]  Value Based [facility] Social Worker, Population Health [phone]

## 2024-03-29 NOTE — Telephone Encounter (Signed)
 Completed new FL2 form. [place], [place].  Completed top of form.    Photo in Media tab. Please upload and email to Veleria is to take care of next steps.   Form in RN side wall pocket  Suzann Daring, MD  Family Medicine Teaching Service

## 2024-04-01 ENCOUNTER — Encounter: Payer: Self-pay | Admitting: Family Medicine

## 2024-04-01 ENCOUNTER — Ambulatory Visit: Admitting: Family Medicine

## 2024-04-01 ENCOUNTER — Other Ambulatory Visit: Payer: Self-pay

## 2024-04-01 ENCOUNTER — Emergency Department (HOSPITAL_COMMUNITY)

## 2024-04-01 ENCOUNTER — Emergency Department (HOSPITAL_COMMUNITY)
Admission: EM | Admit: 2024-04-01 | Discharge: 2024-04-02 | Disposition: A | Discharge status: Home / Self Care | Source: Home / Self Care | Attending: Emergency Medicine | Admitting: Emergency Medicine

## 2024-04-01 VITALS — BP 159/84 | HR 85 | Ht 64.0 in

## 2024-04-01 DIAGNOSIS — N184 Chronic kidney disease, stage 4 (severe): Secondary | ICD-10-CM | POA: Diagnosis not present

## 2024-04-01 DIAGNOSIS — Z79899 Other long term (current) drug therapy: Secondary | ICD-10-CM | POA: Insufficient documentation

## 2024-04-01 DIAGNOSIS — E1122 Type 2 diabetes mellitus with diabetic chronic kidney disease: Secondary | ICD-10-CM | POA: Insufficient documentation

## 2024-04-01 DIAGNOSIS — R112 Nausea with vomiting, unspecified: Secondary | ICD-10-CM | POA: Insufficient documentation

## 2024-04-01 DIAGNOSIS — N189 Chronic kidney disease, unspecified: Secondary | ICD-10-CM | POA: Insufficient documentation

## 2024-04-01 DIAGNOSIS — Z7982 Long term (current) use of aspirin: Secondary | ICD-10-CM | POA: Insufficient documentation

## 2024-04-01 DIAGNOSIS — I10 Essential (primary) hypertension: Secondary | ICD-10-CM | POA: Diagnosis not present

## 2024-04-01 DIAGNOSIS — R1110 Vomiting, unspecified: Secondary | ICD-10-CM

## 2024-04-01 DIAGNOSIS — N390 Urinary tract infection, site not specified: Secondary | ICD-10-CM | POA: Diagnosis not present

## 2024-04-01 DIAGNOSIS — R1013 Epigastric pain: Secondary | ICD-10-CM | POA: Diagnosis not present

## 2024-04-01 DIAGNOSIS — K449 Diaphragmatic hernia without obstruction or gangrene: Secondary | ICD-10-CM | POA: Diagnosis not present

## 2024-04-01 DIAGNOSIS — I129 Hypertensive chronic kidney disease with stage 1 through stage 4 chronic kidney disease, or unspecified chronic kidney disease: Secondary | ICD-10-CM | POA: Insufficient documentation

## 2024-04-01 DIAGNOSIS — R000 Tachycardia, unspecified: Secondary | ICD-10-CM | POA: Diagnosis not present

## 2024-04-01 DIAGNOSIS — K439 Ventral hernia without obstruction or gangrene: Secondary | ICD-10-CM | POA: Diagnosis not present

## 2024-04-01 DIAGNOSIS — R1084 Generalized abdominal pain: Secondary | ICD-10-CM | POA: Diagnosis not present

## 2024-04-01 LAB — COMPREHENSIVE METABOLIC PANEL WITH GFR
ALT: 15 U/L (ref 0–44)
AST: 16 U/L (ref 15–41)
Albumin: 3.2 g/dL — ABNORMAL LOW (ref 3.5–5.0)
Alkaline Phosphatase: 105 U/L (ref 38–126)
Anion gap: 13 (ref 5–15)
BUN: 63 mg/dL — ABNORMAL HIGH (ref 8–23)
CO2: 22 mmol/L (ref 22–32)
Calcium: 9.3 mg/dL (ref 8.9–10.3)
Chloride: 106 mmol/L (ref 98–111)
Creatinine, Ser: 2.16 mg/dL — ABNORMAL HIGH (ref 0.44–1.00)
GFR, Estimated: 23 mL/min — ABNORMAL LOW (ref >60)
Glucose, Bld: 180 mg/dL — ABNORMAL HIGH (ref 70–99)
Potassium: 4.2 mmol/L (ref 3.5–5.1)
Sodium: 141 mmol/L (ref 135–145)
Total Bilirubin: 0.6 mg/dL (ref 0.0–1.2)
Total Protein: 7.8 g/dL (ref 6.5–8.1)

## 2024-04-01 LAB — TROPONIN I (HIGH SENSITIVITY)
Troponin I (High Sensitivity): 25 ng/L — ABNORMAL HIGH (ref <18)
Troponin I (High Sensitivity): 27 ng/L — ABNORMAL HIGH (ref <18)

## 2024-04-01 LAB — URINALYSIS, ROUTINE W REFLEX MICROSCOPIC
Bilirubin Urine: NEGATIVE
Glucose, UA: NEGATIVE mg/dL
Ketones, ur: NEGATIVE mg/dL
Nitrite: NEGATIVE
Protein, ur: 100 mg/dL — AB
Specific Gravity, Urine: 1.025 (ref 1.005–1.030)
pH: 6 (ref 5.0–8.0)

## 2024-04-01 LAB — CBC
HCT: 40.8 % (ref 36.0–46.0)
Hemoglobin: 12.6 g/dL (ref 12.0–15.0)
MCH: 25.3 pg — ABNORMAL LOW (ref 26.0–34.0)
MCHC: 30.9 g/dL (ref 30.0–36.0)
MCV: 81.9 fL (ref 80.0–100.0)
Platelets: 383 K/uL (ref 150–400)
RBC: 4.98 MIL/uL (ref 3.87–5.11)
RDW: 15 % (ref 11.5–15.5)
WBC: 13.7 K/uL — ABNORMAL HIGH (ref 4.0–10.5)
nRBC: 0 % (ref 0.0–0.2)

## 2024-04-01 LAB — LIPASE, BLOOD: Lipase: 34 U/L (ref 11–51)

## 2024-04-01 LAB — URINALYSIS, MICROSCOPIC (REFLEX)

## 2024-04-01 MED ORDER — SODIUM CHLORIDE 0.9 % IV SOLN
1.0000 g | Freq: Once | INTRAVENOUS | Status: AC
  Administered 2024-04-01: 1 g via INTRAVENOUS
  Filled 2024-04-01: qty 10

## 2024-04-01 MED ORDER — ONDANSETRON HCL 4 MG/2ML IJ SOLN
4.0000 mg | Freq: Once | INTRAMUSCULAR | Status: AC
  Administered 2024-04-01: 4 mg via INTRAVENOUS
  Filled 2024-04-01: qty 2

## 2024-04-01 MED ORDER — FENTANYL CITRATE (PF) 50 MCG/ML IJ SOSY: 25.0000 ug | PREFILLED_SYRINGE | Freq: Once | INTRAMUSCULAR | Status: DC

## 2024-04-01 MED ORDER — CEPHALEXIN 500 MG PO CAPS: 500.0000 mg | ORAL_CAPSULE | Freq: Four times a day (QID) | ORAL | 0 refills | Status: DC

## 2024-04-01 MED ORDER — LACTATED RINGERS IV BOLUS
500.0000 mL | Freq: Once | INTRAVENOUS | Status: AC
  Administered 2024-04-01: 500 mL via INTRAVENOUS

## 2024-04-01 MED ORDER — ONDANSETRON 4 MG PO TBDP
4.0000 mg | ORAL_TABLET | Freq: Once | ORAL | Status: AC
  Administered 2024-04-01: 4 mg via ORAL

## 2024-04-01 NOTE — ED Notes (Signed)
 CCMD contacted to put patient on cardiac monitoring.

## 2024-04-01 NOTE — ED Notes (Signed)
 Pt gone to CT, phleb to get blood when she comes back

## 2024-04-01 NOTE — ED Notes (Signed)
 Pt. Given orange juice for fluid challenge, pt was able to drink without vomiting.

## 2024-04-01 NOTE — Discharge Instructions (Addendum)
 Thank you for letting us  evaluate you today.  Your EKG did show new electrical changes from previous EKG.  I have reached out to cardiology who recommends you follow-up outpatient.  Your troponins (cardiac enzymes) were not elevated so we do not think that you are having a acute heart injury or heart attack.  Please call to make an appointment with cardiology.  Your CT of your abdomen showed showed a small hernia that contains fat.  Your urine did appear infected.  We have given you a dose of IV antibiotics here in the emergency department as well as a prescription for Keflex.  Please take as prescribed and do not miss a dose even if you are feeling better.  Please follow-up with primary care provider for repeat lab work, reassessment in the next 1-2 weeks.  Return to Emergency Department for experience chest pain, shortness of breath, swelling and pain in your legs specially if it is in 1 leg, worsening symptoms

## 2024-04-01 NOTE — ED Provider Notes (Signed)
 [place] EMERGENCY DEPARTMENT AT [facility] Provider Note   CSN: [id number] Arrival date & time: 04/01/24  1115     Patient presents with: Abdominal Pain, Nausea, and Emesis   Lauren Craig is a 78 y.o. female.   HPI 78 year old female with a history of hypertension, diabetes, CVA, CKD, chronic nausea, vomiting and intermittent diarrhea, ICH, hypertension, prior C. difficile, PVD, esophagitis, osteomyelitis, hyperlipidemia who presents to the emergency department with complaints of ongoing nausea, vomiting over the last several days.  Reports epigastric pain. Denies any fevers or chills.  No diarrhea.  No urinary symptoms.  Has had poor p.o. intake over the last several days.  Reports history of cholecystectomy, reports pain similar to that.    Prior to Admission medications   Medication Sig Start Date End Date Taking? Authorizing Provider  acetaminophen  (TYLENOL ) 325 MG tablet Take 2 tablets (650 mg total) by mouth every 6 (six) hours as needed for headache. 11/01/20   Malvina Ellen, MD  albuterol  (VENTOLIN  HFA) 108 (90 Base) MCG/ACT inhaler Inhale 2 puffs into the lungs every 6 (six) hours as needed for wheezing or shortness of breath. 01/16/24   Delores Suzann HERO, MD  amLODipine  (NORVASC ) 10 MG tablet Take 1 tablet (10 mg total) by mouth daily. 01/16/24   Delores Suzann HERO, MD  Apoaequorin (PREVAGEN PO) Take 1 tablet by mouth daily.    [provider]  aspirin  EC 81 MG tablet Take 1 tablet (81 mg total) by mouth daily. Swallow whole. 01/16/24   Delores Suzann HERO, MD  cloNIDine  (CATAPRES ) 0.1 MG tablet Take 1 tablet (0.1 mg total) by mouth 2 (two) times daily. 02/15/24   Delores Suzann HERO, MD  doxycycline  (VIBRAMYCIN ) 100 MG capsule Take 1 capsule (100 mg total) by mouth 2 (two) times daily. 03/16/24   Horton, Charmaine FALCON, MD  escitalopram  (LEXAPRO ) 20 MG tablet Take 1 tablet (20 mg total) by mouth daily. 01/16/24   Delores Suzann HERO, MD  ezetimibe  (ZETIA ) 10 MG tablet Take 1  tablet (10 mg total) by mouth daily. 02/28/24   McDiarmid, Krystal BIRCH, MD  famotidine  (PEPCID ) 10 MG tablet Take 1 tablet (10 mg total) by mouth daily. 02/02/24   Delores Suzann HERO, MD  fexofenadine  (ALLEGRA  ALLERGY) 60 MG tablet Take 1 tablet (60 mg total) by mouth 2 (two) times daily. 01/16/24   Delores Suzann HERO, MD  fluticasone  (FLONASE ) 50 MCG/ACT nasal spray SPRAY 2 SPRAYS INTO EACH NOSTRIL EVERY DAY 10/19/23   Delores Suzann HERO, MD  furosemide  (LASIX ) 20 MG tablet Take 2 tablets (40 mg total) by mouth 2 (two) times daily. For swelling 02/02/24   Delores Suzann HERO, MD  hydrocortisone  2.5 % ointment Apply topically 2 (two) times daily. On red areas on legs Patient not taking: Reported on 01/09/2024 11/09/22   Jeanelle Layman CROME, MD  Nystatin  (GERHARDT'S BUTT CREAM) CREA Use twice daily in skin folds Patient not taking: Reported on 01/09/2024 11/27/23   Delores Suzann HERO, MD  ondansetron  (ZOFRAN -ODT) 4 MG disintegrating tablet Take 1 tablet (4 mg total) by mouth every 8 (eight) hours as needed for nausea or vomiting. 01/16/24   Delores Suzann HERO, MD  rosuvastatin  (CRESTOR ) 10 MG tablet Take 1 tablet (10 mg total) by mouth daily. 01/16/24   Delores Suzann HERO, MD  sodium zirconium cyclosilicate  (LOKELMA ) 10 g PACK packet Take 10 g by mouth daily. Patient not taking: Reported on 01/09/2024 12/12/23   Delores Suzann HERO, MD  triamcinolone  (KENALOG ) 0.025 % ointment  Apply 1 Application topically 2 (two) times daily. Apply to right ankle rash Patient not taking: Reported on 01/09/2024 05/10/23   Delores Suzann HERO, MD    Allergies: Penicillins, Insulin  lispro, Insulin  lispro prot & lispro, Semaglutide, and Metformin    Review of Systems Ten systems reviewed and are negative for acute change, except as noted in the HPI.   Updated Vital Signs BP (!) 175/75   Pulse 84   Temp 97.9 F (36.6 C) (Oral)   Resp 17   Ht 5' 5 (1.651 m)   Wt 69.9 kg   SpO2 100%   BMI 25.63 kg/m   Physical Exam Vitals and nursing note reviewed.   Constitutional:      General: She is not in acute distress.    Appearance: She is well-developed.  HENT:     Head: Normocephalic and atraumatic.  Eyes:     Conjunctiva/sclera: Conjunctivae normal.  Cardiovascular:     Rate and Rhythm: Normal rate and regular rhythm.     Heart sounds: No murmur heard. Pulmonary:     Effort: Pulmonary effort is normal. No respiratory distress.     Breath sounds: Normal breath sounds.  Abdominal:     Palpations: Abdomen is soft.     Tenderness: There is no abdominal tenderness.     Comments: No significant right upper quadrant tenderness, abdomen is soft, peritoneal signs, no guarding, mild generalized tenderness  Musculoskeletal:        General: No swelling.     Cervical back: Neck supple.  Skin:    General: Skin is warm and dry.     Capillary Refill: Capillary refill takes less than 2 seconds.  Neurological:     Mental Status: She is alert.  Psychiatric:        Mood and Affect: Mood normal.     (all labs ordered are listed, but only abnormal results are displayed) Labs Reviewed  COMPREHENSIVE METABOLIC PANEL WITH GFR - Abnormal; Notable for the following components:      Result Value   Glucose, Bld 180 (*)    BUN 63 (*)    Creatinine, Ser 2.16 (*)    Albumin 3.2 (*)    GFR, Estimated 23 (*)    All other components within normal limits  CBC - Abnormal; Notable for the following components:   WBC 13.7 (*)    MCH 25.3 (*)    All other components within normal limits  TROPONIN I (HIGH SENSITIVITY) - Abnormal; Notable for the following components:   Troponin I (High Sensitivity) 25 (*)    All other components within normal limits  TROPONIN I (HIGH SENSITIVITY) - Abnormal; Notable for the following components:   Troponin I (High Sensitivity) 27 (*)    All other components within normal limits  LIPASE, BLOOD  URINALYSIS, ROUTINE W REFLEX MICROSCOPIC    EKG: EKG Interpretation Date/Time:  Monday April 01 2024 12:33:20  EST Ventricular Rate:  87 PR Interval:  170 QRS Duration:  132 QT Interval:  398 QTC Calculation: 478 R Axis:   174  Text Interpretation: Normal sinus rhythm with sinus arrhythmia Right bundle branch block Left posterior fascicular block Bifascicular block Inferior infarct , age undetermined Confirmed by Freddi Hamilton [id number]) on 04/01/2024 1:27:09 PM  Radiology: CT ABDOMEN PELVIS WO CONTRAST Result Date: 04/01/2024 EXAM: CT ABDOMEN AND PELVIS WITHOUT CONTRAST 04/01/2024 12:28:00 PM TECHNIQUE: CT of the abdomen and pelvis was performed without the administration of intravenous contrast. Multiplanar reformatted images are provided for review.  Automated exposure control, iterative reconstruction, and/or weight-based adjustment of the mA/kV was utilized to reduce the radiation dose to as low as reasonably achievable. COMPARISON: 05/22/2022 CLINICAL HISTORY: Bowel obstruction suspected. FINDINGS: LOWER CHEST: Mild mosaic attenuation at the lung bases is likely due to air trapping. Mild cardiomegaly. Right coronary artery calcification. LIVER: The liver is unremarkable. GALLBLADDER AND BILE DUCTS: Cholecystectomy. No biliary ductal dilatation. SPLEEN: No acute abnormality. PANCREAS: Mild pancreatic atrophy, which is likely within normal variation for age. ADRENAL GLANDS: No acute abnormality. KIDNEYS, URETERS AND BLADDER: Extensive bilateral renal vascular calcifications. Mild renal cortical thinning bilaterally. Lower pole right renal 1.2 cm cyst or minimally complex cyst does not warrant specific imaging follow-up. No bladder or ureteric calculi. No hydroureter. SABRA GI AND BOWEL: Small hiatal hernia. Normal remainder of the stomach. Scattered colonic diverticula. Normal small bowel caliber. There is no bowel obstruction. Normal terminal ileum and appendix. PERITONEUM AND RETROPERITONEUM: No significant free fluid. No free intraperitoneal air. Fat-containing ventral pelvic wall hernia is small, new 10/31/2021.  VASCULATURE: Aorta is normal in caliber. Advanced aortic and branch vessel atherosclerosis. LYMPH NODES: No lymphadenopathy. REPRODUCTIVE ORGANS: Hysterectomy. Pelvic floor laxity. BONES AND SOFT TISSUES: Degenerative changes of both hips. Mild osteopenia. Lumbosacral spondylosis. No acute osseous abnormality. No focal soft tissue abnormality. IMPRESSION: 1. No evidence of bowel obstruction or explanation for acute pain 2. New small ventral pelvic hernia contains fat 3. Incidental findings, including: Aortic atherosclerosis (ICD-10: I70.0). Coronary artery atherosclerosis. Small hiatal hernia Electronically signed by: Rockey Kilts MD 04/01/2024 01:51 PM EST RP Workstation: [id number]     Procedures   Medications Ordered in the ED  lactated ringers  bolus 500 mL (has no administration in time range)  ondansetron  (ZOFRAN ) injection 4 mg (has no administration in time range)                                    Medical Decision Making Amount and/or Complexity of Data Reviewed Labs: ordered.  Risk Prescription drug management.   This patient presents to the ED for concern of abdominal pain and vomiting, this involves an extensive number of treatment options, and is a complaint that carries with it a high risk of complications and morbidity.  The differential diagnosis includes choledocholithiasis, SBO, gastroenteritis, GERD, ACS, pancreatitis, appendicitis, UTI   Co morbidities that complicate the patient evaluation  Chronic nausea, vomiting diarrhea   Additional history obtained:  Additional history obtained from patient External records from outside source obtained and reviewed    Lab Tests:  I Ordered, and personally interpreted labs.  The pertinent results include:    CBC with leukocytosis 13.7, CMP without significant Electra abnormalities, glucose 180, creatinine of 2.16 which appears to be more or less at baseline. Mild increase in troponin to 25  Imaging Studies  ordered:  I ordered imaging studies including CT of the abdomen pelvis I independently visualized and interpreted imaging which showed New small ventral pelvic hernia contains fat, no other acute findings  I agree with the radiologist interpretation   Cardiac Monitoring:  The patient was maintained on a cardiac monitor.  I personally viewed and interpreted the cardiac monitored which showed an underlying rhythm of: normal sinus rhythm, with new T inversions and RBBB   Medicines ordered and prescription drug management:  I ordered medication including LR bolus, Zofran  and fentanyl  for pain.  However unfortunately there was a delay in IV access, and reassessment patient felt  better. Reevaluation of the patient after these medicines showed that the patient improved I have reviewed the patients home medicines and have made adjustments as needed   Test Considered:  CT chest, right upper quadrant ultrasound   Critical Interventions:  None   Consultations Obtained:  Cardiology   Problem List / ED Course:  78 year old female presenting with nausea, vomiting times several days.  Abdominal exam is fairly benign, CT was obtained in the triage which showed a new fat-containing ventral hernia but no evidence of strangulation.  She has a history of cholecystectomy, no evidence of bile duct dilation on CT and her LFTs and bili are normal so have low suspicion for retained stone.  She has slight elevation in her troponin, repeat is pending. Urine is pending. Given new T wave inversions and RBB, will consult cardiology. Will give IV fluids, fentanyl  and Zofran  and re-evaluate.    Reevaluation:  After the interventions noted above, I reevaluated the patient and found that they have :improved   Social Determinants of Health:  None   Dispostion:  Care signed out to oncoming team      Final diagnoses:  Nausea and vomiting, unspecified vomiting type    ED Discharge Orders      None          Vonn Hadassah DELENA DEVONNA 04/01/24 1640    Freddi Hamilton, MD 04/04/24 [phone]

## 2024-04-01 NOTE — ED Provider Notes (Signed)
 Received patient at sign out from previous provider. See their note  In short, patient presents to ED for evaluation of epigastric pain, NV. Reports that she has chronic NV. Has been vomiting for past several weeks but worsened over past three days. Vomits 4-5x a day. Last BM today and normal.  VS notable for mild HTN. No complaints of HA, blurred vision. Creatine mildly elevated but similar to baseline CKD stage 3. Low suspicion for hypertensive crisis. Did not take BP medications this morning.  ED workup notable for leukocytosis of 13.7, creatinine 2.16 (per baseline CKD stage III), BUN 63 (baseline 30-63 over past three mo).  Troponins flat.  EKG does show new right bundle branch with inverted T's in noncontiguous leads.  CT abd without acute abnormality.  Has been tolerating oral fluids upon completion of ED workup.  Passes p.o. challenge.  Did reach out to cardiology Dr. Mickeal regarding new RBBB and inverted T waves that is new when compared to previous EKG on 12/01/23. I am reassured that patient reports improvement of pain, NV without intervention. Pain and NV appear chronic. No complaints of chest pain, shob. Troponins flat. Cardiology reviewed EKG, troponins and do not recommend further cardiac evaluation, work up, nor urgent ouptatient follow up. Does follow with cards outpatient. Will have her follow up at next appointment regarding new EKG change  Low suspicion for PE. Although she has new RBBB there is no tachycardia, hypoxia. No complaints of cp, shob, hemoptysis. No recent surgeries nor travel. No pedal edema.   Spoke to daughter who is listed as emergency contact via telephone. She confirmed patient's DOB, name per HIPAA guidelines. Patient is currently living with daughter. Seeking SNF placement for patient currently. Discussed ED workup, dispo with her and all her questions answered to her satisfaction and she is comfortable with patient coming home. She will be home to receive  patient. I discussed ED return precautions and recommendation to follow up with PCP regarding labs, improvement following abx for UTI within next 1-2 weeks. She agrees with this plan   Lauren Tinnie BRAVO, PA 04/01/24 2102    Lauren Roxie HERO, DO 04/01/24 2353

## 2024-04-01 NOTE — ED Notes (Signed)
 This RN spoke with pt daughter Samule, with pt permission, AVS reviewed and daughter confirms she will be home when ptar arrvies.

## 2024-04-01 NOTE — Patient Instructions (Signed)
 Please go to the Emergency Room  You may need IV fluids and a CT scan

## 2024-04-01 NOTE — ED Provider Triage Note (Signed)
 Emergency Medicine Provider Triage Evaluation Note  Lauren Craig , a 78 y.o. female  was evaluated in triage.  Pt complains of abdominal pain..  Review of Systems  Positive: Nausea vomiting diarrhea abdominal pain Negative: Fevers  Physical Exam  BP (!) 161/76 (BP Location: Left Arm)   Pulse 84   Temp 97.9 F (36.6 C)   Resp 16   Ht 5' 5 (1.651 m)   Wt 69.9 kg   SpO2 100%   BMI 25.63 kg/m  Sitting in bed.  Some abdominal tenderness.  Took history but only nodded yes or no to all the question. Medical Decision Making  Medically screening exam initiated at 11:54 AM.  Appropriate orders placed.  Lauren Craig was informed that the remainder of the evaluation will be completed by another provider, this initial triage assessment does not replace that evaluation, and the importance of remaining in the ED until their evaluation is complete.  Patient abdominal pain.  Has had 3 weeks of symptoms.  Reviewed note from family practice.  They had concern for anginal equivalent versus bowel obstruction/intra-abdominal pathology.  Will get EKG blood work and CT scan.  Creatinine has been elevated so no contrast on CT.   Lauren Lot, MD 04/01/24 1154

## 2024-04-01 NOTE — Assessment & Plan Note (Addendum)
 In the setting of her numerous other comorbidities I am concerned that she could have uremia, an anginal equivalent, bowel obstruction, gastritis, or other intraabdominal pathology. These all represent high risk conditions that require emergency department evaluation.   EMS called EMS transported patient to [facility] ED  Of note, the patient has had progressive memory decline and daughter is seeking placement in long term care for her.  Daughter also has her own health conditions which preclude her from caring for her mother at her apartment.

## 2024-04-01 NOTE — ED Triage Notes (Signed)
 Pt BIB [facility] from [facility] PCP office d/t n/v & abd pain that she rates 9/10 that has progressed ince Saturday. A/Ox4, denies diarrhea. Hx of gallbladder removal & pt reports this pain feels similar. EMS V/S: 166/74, 86 bpm, 20 resp, 100% on RA, CBG 219, 98.5 T.

## 2024-04-02 ENCOUNTER — Telehealth: Payer: Self-pay | Admitting: Family Medicine

## 2024-04-02 NOTE — Telephone Encounter (Signed)
 Patient sent to ED on 11/3 due to nausea and vomiting. Please call daughter Candia) to schedule follow up sometime in next 2-3 weeks--okay for virtual visit if with me   Suzann Daring, MD  [facility] Medicine Teaching Service

## 2024-04-03 ENCOUNTER — Other Ambulatory Visit: Payer: Self-pay

## 2024-04-03 ENCOUNTER — Inpatient Hospital Stay (HOSPITAL_COMMUNITY)
Admission: EM | Admit: 2024-04-03 | Discharge: 2024-04-07 | DRG: 690 | Disposition: A | Discharge status: Skilled Nursing Facility | Attending: Family Medicine | Admitting: Family Medicine

## 2024-04-03 ENCOUNTER — Encounter (HOSPITAL_COMMUNITY): Payer: Self-pay | Admitting: Family Medicine

## 2024-04-03 DIAGNOSIS — I739 Peripheral vascular disease, unspecified: Secondary | ICD-10-CM | POA: Diagnosis not present

## 2024-04-03 DIAGNOSIS — R9431 Abnormal electrocardiogram [ECG] [EKG]: Secondary | ICD-10-CM | POA: Diagnosis not present

## 2024-04-03 DIAGNOSIS — E785 Hyperlipidemia, unspecified: Secondary | ICD-10-CM | POA: Diagnosis present

## 2024-04-03 DIAGNOSIS — I129 Hypertensive chronic kidney disease with stage 1 through stage 4 chronic kidney disease, or unspecified chronic kidney disease: Secondary | ICD-10-CM | POA: Diagnosis present

## 2024-04-03 DIAGNOSIS — Z90710 Acquired absence of both cervix and uterus: Secondary | ICD-10-CM | POA: Diagnosis not present

## 2024-04-03 DIAGNOSIS — Z8619 Personal history of other infectious and parasitic diseases: Secondary | ICD-10-CM | POA: Diagnosis not present

## 2024-04-03 DIAGNOSIS — Z888 Allergy status to other drugs, medicaments and biological substances status: Secondary | ICD-10-CM | POA: Diagnosis not present

## 2024-04-03 DIAGNOSIS — F32A Depression, unspecified: Secondary | ICD-10-CM | POA: Diagnosis present

## 2024-04-03 DIAGNOSIS — R2681 Unsteadiness on feet: Secondary | ICD-10-CM | POA: Diagnosis not present

## 2024-04-03 DIAGNOSIS — Z87891 Personal history of nicotine dependence: Secondary | ICD-10-CM

## 2024-04-03 DIAGNOSIS — Z79899 Other long term (current) drug therapy: Secondary | ICD-10-CM | POA: Diagnosis not present

## 2024-04-03 DIAGNOSIS — E1122 Type 2 diabetes mellitus with diabetic chronic kidney disease: Secondary | ICD-10-CM | POA: Diagnosis not present

## 2024-04-03 DIAGNOSIS — N184 Chronic kidney disease, stage 4 (severe): Secondary | ICD-10-CM | POA: Diagnosis not present

## 2024-04-03 DIAGNOSIS — R1311 Dysphagia, oral phase: Secondary | ICD-10-CM | POA: Diagnosis not present

## 2024-04-03 DIAGNOSIS — E538 Deficiency of other specified B group vitamins: Secondary | ICD-10-CM | POA: Diagnosis not present

## 2024-04-03 DIAGNOSIS — E1151 Type 2 diabetes mellitus with diabetic peripheral angiopathy without gangrene: Secondary | ICD-10-CM | POA: Diagnosis present

## 2024-04-03 DIAGNOSIS — R1310 Dysphagia, unspecified: Secondary | ICD-10-CM | POA: Diagnosis not present

## 2024-04-03 DIAGNOSIS — G8929 Other chronic pain: Secondary | ICD-10-CM | POA: Diagnosis not present

## 2024-04-03 DIAGNOSIS — R112 Nausea with vomiting, unspecified: Secondary | ICD-10-CM | POA: Diagnosis not present

## 2024-04-03 DIAGNOSIS — Z7982 Long term (current) use of aspirin: Secondary | ICD-10-CM | POA: Diagnosis not present

## 2024-04-03 DIAGNOSIS — M1990 Unspecified osteoarthritis, unspecified site: Secondary | ICD-10-CM | POA: Diagnosis not present

## 2024-04-03 DIAGNOSIS — I10 Essential (primary) hypertension: Secondary | ICD-10-CM | POA: Diagnosis not present

## 2024-04-03 DIAGNOSIS — Z9049 Acquired absence of other specified parts of digestive tract: Secondary | ICD-10-CM | POA: Diagnosis not present

## 2024-04-03 DIAGNOSIS — F419 Anxiety disorder, unspecified: Secondary | ICD-10-CM | POA: Diagnosis present

## 2024-04-03 DIAGNOSIS — R1013 Epigastric pain: Secondary | ICD-10-CM | POA: Diagnosis not present

## 2024-04-03 DIAGNOSIS — N390 Urinary tract infection, site not specified: Secondary | ICD-10-CM | POA: Diagnosis not present

## 2024-04-03 DIAGNOSIS — Z789 Other specified health status: Secondary | ICD-10-CM

## 2024-04-03 DIAGNOSIS — R41841 Cognitive communication deficit: Secondary | ICD-10-CM | POA: Diagnosis not present

## 2024-04-03 DIAGNOSIS — N3000 Acute cystitis without hematuria: Secondary | ICD-10-CM | POA: Diagnosis not present

## 2024-04-03 DIAGNOSIS — Z880 Allergy status to penicillin: Secondary | ICD-10-CM

## 2024-04-03 DIAGNOSIS — E119 Type 2 diabetes mellitus without complications: Secondary | ICD-10-CM | POA: Diagnosis not present

## 2024-04-03 DIAGNOSIS — R531 Weakness: Secondary | ICD-10-CM | POA: Diagnosis not present

## 2024-04-03 DIAGNOSIS — Z8673 Personal history of transient ischemic attack (TIA), and cerebral infarction without residual deficits: Secondary | ICD-10-CM

## 2024-04-03 DIAGNOSIS — R471 Dysarthria and anarthria: Secondary | ICD-10-CM | POA: Insufficient documentation

## 2024-04-03 DIAGNOSIS — R109 Unspecified abdominal pain: Secondary | ICD-10-CM

## 2024-04-03 DIAGNOSIS — F39 Unspecified mood [affective] disorder: Secondary | ICD-10-CM | POA: Diagnosis not present

## 2024-04-03 DIAGNOSIS — I63512 Cerebral infarction due to unspecified occlusion or stenosis of left middle cerebral artery: Secondary | ICD-10-CM | POA: Diagnosis not present

## 2024-04-03 DIAGNOSIS — D649 Anemia, unspecified: Secondary | ICD-10-CM | POA: Diagnosis not present

## 2024-04-03 DIAGNOSIS — M6281 Muscle weakness (generalized): Secondary | ICD-10-CM | POA: Diagnosis not present

## 2024-04-03 LAB — CBC WITH DIFFERENTIAL/PLATELET
Abs Immature Granulocytes: 0.02 K/uL (ref 0.00–0.07)
Basophils Absolute: 0.1 K/uL (ref 0.0–0.1)
Basophils Relative: 1 %
Eosinophils Absolute: 0.1 K/uL (ref 0.0–0.5)
Eosinophils Relative: 2 %
HCT: 45.1 % (ref 36.0–46.0)
Hemoglobin: 13.7 g/dL (ref 12.0–15.0)
Immature Granulocytes: 0 %
Lymphocytes Relative: 18 %
Lymphs Abs: 1.6 K/uL (ref 0.7–4.0)
MCH: 25.3 pg — ABNORMAL LOW (ref 26.0–34.0)
MCHC: 30.4 g/dL (ref 30.0–36.0)
MCV: 83.4 fL (ref 80.0–100.0)
Monocytes Absolute: 0.4 K/uL (ref 0.1–1.0)
Monocytes Relative: 5 %
Neutro Abs: 6.5 K/uL (ref 1.7–7.7)
Neutrophils Relative %: 74 %
Platelets: 355 K/uL (ref 150–400)
RBC: 5.41 MIL/uL — ABNORMAL HIGH (ref 3.87–5.11)
RDW: 15 % (ref 11.5–15.5)
WBC: 8.7 K/uL (ref 4.0–10.5)
nRBC: 0 % (ref 0.0–0.2)

## 2024-04-03 LAB — URINALYSIS, ROUTINE W REFLEX MICROSCOPIC
Bilirubin Urine: NEGATIVE
Glucose, UA: NEGATIVE mg/dL
Ketones, ur: NEGATIVE mg/dL
Nitrite: NEGATIVE
Protein, ur: >=300 mg/dL — AB
Specific Gravity, Urine: 1.013 (ref 1.005–1.030)
WBC, UA: >50 WBC/hpf (ref 0–5)
pH: 5 (ref 5.0–8.0)

## 2024-04-03 LAB — I-STAT CG4 LACTIC ACID, ED: Lactic Acid, Venous: 2.7 mmol/L (ref 0.5–1.9)

## 2024-04-03 LAB — COMPREHENSIVE METABOLIC PANEL WITH GFR
ALT: 13 U/L (ref 0–44)
AST: 17 U/L (ref 15–41)
Albumin: 3.7 g/dL (ref 3.5–5.0)
Alkaline Phosphatase: 115 U/L (ref 38–126)
Anion gap: 16 — ABNORMAL HIGH (ref 5–15)
BUN: 47 mg/dL — ABNORMAL HIGH (ref 8–23)
CO2: 19 mmol/L — ABNORMAL LOW (ref 22–32)
Calcium: 9.5 mg/dL (ref 8.9–10.3)
Chloride: 103 mmol/L (ref 98–111)
Creatinine, Ser: 2.06 mg/dL — ABNORMAL HIGH (ref 0.44–1.00)
GFR, Estimated: 24 mL/min — ABNORMAL LOW (ref >60)
Glucose, Bld: 146 mg/dL — ABNORMAL HIGH (ref 70–99)
Potassium: 3.9 mmol/L (ref 3.5–5.1)
Sodium: 138 mmol/L (ref 135–145)
Total Bilirubin: 0.6 mg/dL (ref 0.0–1.2)
Total Protein: 8.7 g/dL — ABNORMAL HIGH (ref 6.5–8.1)

## 2024-04-03 LAB — LIPASE, BLOOD: Lipase: 50 U/L (ref 11–51)

## 2024-04-03 LAB — LACTIC ACID, PLASMA
Lactic Acid, Venous: 1.3 mmol/L (ref 0.5–1.9)
Lactic Acid, Venous: 0.9 mmol/L (ref 0.5–1.9)

## 2024-04-03 MED ORDER — ONDANSETRON HCL 4 MG/2ML IJ SOLN
4.0000 mg | Freq: Once | INTRAMUSCULAR | Status: AC
  Administered 2024-04-03: 4 mg via INTRAVENOUS
  Filled 2024-04-03: qty 2

## 2024-04-03 MED ORDER — LACTATED RINGERS IV BOLUS
1000.0000 mL | Freq: Once | INTRAVENOUS | Status: AC
  Administered 2024-04-03: 1000 mL via INTRAVENOUS

## 2024-04-03 MED ORDER — SODIUM CHLORIDE 0.9 % IV SOLN
1.0000 g | Freq: Once | INTRAVENOUS | Status: AC
  Administered 2024-04-03: 1 g via INTRAVENOUS
  Filled 2024-04-03: qty 10

## 2024-04-03 MED ORDER — EZETIMIBE 10 MG PO TABS
10.0000 mg | ORAL_TABLET | Freq: Every day | ORAL | Status: DC
  Administered 2024-04-04 – 2024-04-07 (×4): 10 mg via ORAL
  Filled 2024-04-03 (×5): qty 1

## 2024-04-03 MED ORDER — AMLODIPINE BESYLATE 5 MG PO TABS
10.0000 mg | ORAL_TABLET | Freq: Every day | ORAL | Status: DC
  Administered 2024-04-04 – 2024-04-07 (×4): 10 mg via ORAL
  Filled 2024-04-03 (×5): qty 2

## 2024-04-03 MED ORDER — ENOXAPARIN SODIUM 30 MG/0.3ML IJ SOSY
30.0000 mg | PREFILLED_SYRINGE | INTRAMUSCULAR | Status: DC
  Administered 2024-04-04 – 2024-04-06 (×3): 30 mg via SUBCUTANEOUS
  Filled 2024-04-03 (×3): qty 0.3

## 2024-04-03 MED ORDER — ROSUVASTATIN CALCIUM 5 MG PO TABS
10.0000 mg | ORAL_TABLET | Freq: Every day | ORAL | Status: DC
  Administered 2024-04-04 – 2024-04-07 (×4): 10 mg via ORAL
  Filled 2024-04-03 (×5): qty 2

## 2024-04-03 MED ORDER — ALBUTEROL SULFATE (2.5 MG/3ML) 0.083% IN NEBU
3.0000 mL | INHALATION_SOLUTION | Freq: Four times a day (QID) | RESPIRATORY_TRACT | Status: DC | PRN
Start: 2024-04-03 — End: 2024-04-07

## 2024-04-03 MED ORDER — FAMOTIDINE 20 MG PO TABS
10.0000 mg | ORAL_TABLET | Freq: Every day | ORAL | Status: DC
  Administered 2024-04-04 – 2024-04-07 (×4): 10 mg via ORAL
  Filled 2024-04-03 (×5): qty 1

## 2024-04-03 MED ORDER — ASPIRIN 81 MG PO TBEC
81.0000 mg | DELAYED_RELEASE_TABLET | Freq: Every day | ORAL | Status: DC
  Administered 2024-04-04 – 2024-04-07 (×4): 81 mg via ORAL
  Filled 2024-04-03 (×5): qty 1

## 2024-04-03 MED ORDER — CLONIDINE HCL 0.1 MG PO TABS
0.1000 mg | ORAL_TABLET | Freq: Two times a day (BID) | ORAL | Status: DC
  Administered 2024-04-04 – 2024-04-07 (×7): 0.1 mg via ORAL
  Filled 2024-04-03 (×7): qty 1

## 2024-04-03 MED ORDER — PROCHLORPERAZINE EDISYLATE 10 MG/2ML IJ SOLN: 5.0000 mg | Freq: Four times a day (QID) | INTRAMUSCULAR | Status: DC | PRN

## 2024-04-03 MED ORDER — ESCITALOPRAM OXALATE 10 MG PO TABS
20.0000 mg | ORAL_TABLET | Freq: Every day | ORAL | Status: DC
  Administered 2024-04-04 – 2024-04-07 (×4): 20 mg via ORAL
  Filled 2024-04-03 (×5): qty 2

## 2024-04-03 MED ORDER — HYDRALAZINE HCL 25 MG PO TABS
50.0000 mg | ORAL_TABLET | Freq: Two times a day (BID) | ORAL | Status: DC
  Administered 2024-04-04 – 2024-04-07 (×7): 50 mg via ORAL
  Filled 2024-04-03 (×7): qty 2

## 2024-04-03 MED ORDER — ACETAMINOPHEN 500 MG PO TABS: 1000.0000 mg | ORAL_TABLET | Freq: Four times a day (QID) | ORAL | Status: DC | PRN

## 2024-04-03 NOTE — Assessment & Plan Note (Signed)
 Differential as above.  Exam reassuring against acute abdomen.  Patient received IV Zofran  in ED with improvement of symptoms.  Also received 1L fluid bolus in ED, appears well-hydrated at this time.  Mildly reduced bicarb 19 on admission, likely secondary to and/V.  Creatinine appears around baseline. - Oral rehydration - consider IVF as indicated - IV Compazine  as needed - repeat a.m. EKG to follow Qtc - Continue home Pepcid  daily - AM BMP

## 2024-04-03 NOTE — Hospital Course (Addendum)
 Lauren Craig is a 78 y.o. year old with a history of Asthma, HTN, DMII, CKD, CVA, and HLD who presented with inability to tolerated PO intake in the setting of UTI and was admitted to the [facility] Medicine Teaching Service for UTI.  Her hospital course is outlined below.  UTI (urinary tract infection) Patient with known UTI presenting for continued dysuria and difficulty tolerating outpatient regimen.  Initially presented afebrile without leukocytosis. She was recently prescribed Keflex (11/3-11/5 ) and doxycycline  (10/18-11/1) for outpatient treatment of UTI, though had difficulty taking oral antibiotics due to nausea and vomiting as below.  Prior urine culture with E. coli.  On admission she was placed on IV Ceftriaxone  and then transitioned to PO Nitrofurantoin for total of 5 days of antibiotics. Urine culture during admission showed no growth, though this was collected after already initiating antibiotics prior.  Nausea & vomiting Improved with antiemetics during admission.  Was tolerating p.o. prior to discharge.  Continued home Pepcid  during hospitalization.    Dysphagia Per patient's daughter, patient with chronic intermittent dysphagia, dysarthria, and weakness since CVA in 2023. SLP consulted for speech evaluation and initially recommended dysphagia 3 diet.  Patient underwent modified barium swallow, which demonstrated mildly reduced UES but adequate flow of boluses into esophagus, so SLP recommended patient could advanced diet as tolerated.  Hx CVA Patient with limited mobility since CVA in 2023.  PT and OT evaluated patient during admission and recommended SNF.  Patient discharged to SNF.  Other chronic conditions were medically managed with home medications and formulary alternatives as necessary (asthma, HTN, T2DM, CKD, PVD, HLD, depressed mood).  PCP Follow-up Recommendations: Consider referral to outpatient urology given recurrent UTI. Lasix  held on admission and discharge.  Per  chart review, patient uses Lasix  daily for lower extremity swelling.  Consider prn Lasix  rather than daily given kidney function Continue to optimize BP regimen as indicated

## 2024-04-03 NOTE — ED Provider Notes (Signed)
 [place] EMERGENCY DEPARTMENT AT [facility] Provider Note   CSN: [id number] Arrival date & time: 04/03/24  1356     Patient presents with: Code Sepsis and Dysuria   Lauren Craig is a 78 y.o. female.   This is a 78 year old female presenting emergency department complaining of nausea and vomiting.  Reports increased symptoms over the past 2 to 3 days.  No blood in the vomit or the stool.  She was diagnosed with a UTI last week and been on antibiotics.  She states she has been taking them.  Complains of epigastric pain.  She notes that she has some chronic nausea and vomiting at baseline.   Dysuria      Prior to Admission medications   Medication Sig Start Date End Date Taking? Authorizing Provider  acetaminophen  (TYLENOL ) 325 MG tablet Take 2 tablets (650 mg total) by mouth every 6 (six) hours as needed for headache. 11/01/20   Malvina Ellen, MD  albuterol  (VENTOLIN  HFA) 108 (90 Base) MCG/ACT inhaler Inhale 2 puffs into the lungs every 6 (six) hours as needed for wheezing or shortness of breath. 01/16/24   Delores Suzann HERO, MD  amLODipine  (NORVASC ) 10 MG tablet Take 1 tablet (10 mg total) by mouth daily. 01/16/24   Delores Suzann HERO, MD  Apoaequorin (PREVAGEN PO) Take 1 tablet by mouth daily.    [provider]  aspirin  EC 81 MG tablet Take 1 tablet (81 mg total) by mouth daily. Swallow whole. 01/16/24   Delores Suzann HERO, MD  cephALEXin (KEFLEX) 500 MG capsule Take 1 capsule (500 mg total) by mouth 4 (four) times daily. 04/01/24   Minnie Tinnie BRAVO, PA  cloNIDine  (CATAPRES ) 0.1 MG tablet Take 1 tablet (0.1 mg total) by mouth 2 (two) times daily. 02/15/24   Delores Suzann HERO, MD  doxycycline  (VIBRAMYCIN ) 100 MG capsule Take 1 capsule (100 mg total) by mouth 2 (two) times daily. 03/16/24   Horton, Charmaine FALCON, MD  escitalopram  (LEXAPRO ) 20 MG tablet Take 1 tablet (20 mg total) by mouth daily. 01/16/24   Delores Suzann HERO, MD  ezetimibe  (ZETIA ) 10 MG tablet Take 1 tablet (10 mg  total) by mouth daily. 02/28/24   McDiarmid, Krystal BIRCH, MD  famotidine  (PEPCID ) 10 MG tablet Take 1 tablet (10 mg total) by mouth daily. 02/02/24   Delores Suzann HERO, MD  fexofenadine  (ALLEGRA  ALLERGY) 60 MG tablet Take 1 tablet (60 mg total) by mouth 2 (two) times daily. 01/16/24   Delores Suzann HERO, MD  fluticasone  (FLONASE ) 50 MCG/ACT nasal spray SPRAY 2 SPRAYS INTO EACH NOSTRIL EVERY DAY 10/19/23   Delores Suzann HERO, MD  furosemide  (LASIX ) 20 MG tablet Take 2 tablets (40 mg total) by mouth 2 (two) times daily. For swelling 02/02/24   Delores Suzann HERO, MD  hydrocortisone  2.5 % ointment Apply topically 2 (two) times daily. On red areas on legs Patient not taking: Reported on 01/09/2024 11/09/22   Jeanelle Layman CROME, MD  Nystatin  (GERHARDT'S BUTT CREAM) CREA Use twice daily in skin folds Patient not taking: Reported on 01/09/2024 11/27/23   Delores Suzann HERO, MD  ondansetron  (ZOFRAN -ODT) 4 MG disintegrating tablet Take 1 tablet (4 mg total) by mouth every 8 (eight) hours as needed for nausea or vomiting. 01/16/24   Delores Suzann HERO, MD  rosuvastatin  (CRESTOR ) 10 MG tablet Take 1 tablet (10 mg total) by mouth daily. 01/16/24   Delores Suzann HERO, MD  sodium zirconium cyclosilicate  (LOKELMA ) 10 g PACK packet Take 10 g by  mouth daily. Patient not taking: Reported on 01/09/2024 12/12/23   Delores Suzann HERO, MD  triamcinolone  (KENALOG ) 0.025 % ointment Apply 1 Application topically 2 (two) times daily. Apply to right ankle rash Patient not taking: Reported on 01/09/2024 05/10/23   Delores Suzann HERO, MD    Allergies: Penicillins, Insulin  lispro, Insulin  lispro prot & lispro, Semaglutide, and Metformin    Review of Systems  Genitourinary:  Positive for dysuria.    Updated Vital Signs BP (!) 163/76   Pulse 68   Temp 97.8 F (36.6 C) (Oral)   Resp 15   Ht 5' 5 (1.651 m)   Wt 66.8 kg   SpO2 96%   BMI 24.51 kg/m   Physical Exam Vitals and nursing note reviewed.  Constitutional:      General: She is not in acute  distress.    Appearance: She is not toxic-appearing.  HENT:     Head: Normocephalic.     Nose: Nose normal.     Mouth/Throat:     Mouth: Mucous membranes are moist.  Eyes:     Conjunctiva/sclera: Conjunctivae normal.  Cardiovascular:     Rate and Rhythm: Normal rate and regular rhythm.  Pulmonary:     Effort: Pulmonary effort is normal.     Breath sounds: Normal breath sounds.  Abdominal:     General: Abdomen is flat. There is no distension.     Tenderness: There is abdominal tenderness (mild epigastric). There is no guarding or rebound.  Skin:    General: Skin is warm and dry.     Capillary Refill: Capillary refill takes less than 2 seconds.  Neurological:     Mental Status: She is alert. Mental status is at baseline.  Psychiatric:        Mood and Affect: Mood normal.        Behavior: Behavior normal.     (all labs ordered are listed, but only abnormal results are displayed) Labs Reviewed  CBC WITH DIFFERENTIAL/PLATELET - Abnormal; Notable for the following components:      Result Value   RBC 5.41 (*)    MCH 25.3 (*)    All other components within normal limits  COMPREHENSIVE METABOLIC PANEL WITH GFR - Abnormal; Notable for the following components:   CO2 19 (*)    Glucose, Bld 146 (*)    BUN 47 (*)    Creatinine, Ser 2.06 (*)    Total Protein 8.7 (*)    GFR, Estimated 24 (*)    Anion gap 16 (*)    All other components within normal limits  LIPASE, BLOOD  URINALYSIS, ROUTINE W REFLEX MICROSCOPIC  I-STAT CG4 LACTIC ACID, ED  CBG MONITORING, ED  I-STAT CG4 LACTIC ACID, ED    EKG: None  Radiology: No results found.   Procedures   Medications Ordered in the ED  lactated ringers  bolus 1,000 mL (1,000 mLs Intravenous New Bag/Given 04/03/24 1447)  ondansetron  (ZOFRAN ) injection 4 mg (4 mg Intravenous Given 04/03/24 1448)    Clinical Course as of 04/03/24 1559  Wed Apr 03, 2024  1529 Received sign out from Dr. Neysa pending ua, labs. Presented with nausea,  vomiting. Recent CT scan and diagnosis of UTI.  [WS]    Clinical Course User Index [WS] Francesca Elsie CROME, MD                                 Medical Decision Making 78 year old female  presenting emergency department with nausea vomiting.  She is afebrile nontachycardic, normotensive.  Maintaining oxygen saturation on room air.  Does not appear to be in overt distress.  Clear lungs.  Soft nontender abdomen.  Per chart review had negative scans past Monday.  Plan to repeat labs and UA.  Will give IV fluids, Zofran .  Care signed out to and team, dispo pending UA and reassessment.   Amount and/or Complexity of Data Reviewed Labs: ordered. ECG/medicine tests: ordered.  Risk Prescription drug management.       Final diagnoses:  None    ED Discharge Orders     None          Neysa Caron PARAS, DO 04/03/24 1559

## 2024-04-03 NOTE — Assessment & Plan Note (Signed)
 Per patient's daughter, patient with chronic intermittent dysphagia, dysarthria, and weakness since CVA in 2023. - SLP eval/treat - PT/OT eval/treat - Continue home aspirin  81 daily

## 2024-04-03 NOTE — Plan of Care (Signed)
 FMTS Brief Progress Note  S: Patient was seen and evaluated by Dr. Howell and myself. She has no complaints and is not nauseated at this time. She does relay to us  that she is hungry.   O: BP (!) 177/75   Pulse 76   Temp 97.9 F (36.6 C) (Oral)   Resp (!) 21   Ht 5' 5 (1.651 m)   Wt 66.8 kg   SpO2 100%   BMI 24.51 kg/m   GEN: chronically ill appearing female resting comfortably in hospital bed in no acute distress HEENT: tacky mucous membranes, neck supple with FROM CV: distant heart sounds, no murmurs, 2+ radial pulses  A/P: - Encouraged patient to eat and drink as tolerated, especially encourage PO hydration - Orders reviewed. Labs for AM ordered, which was adjusted as needed.  - If condition changes, plan includes compazine , page FMTS.   Lupie Credit, DO 04/03/2024, 7:44 PM PGY-1, [facility] Night Resident  Please page [phone] with questions.

## 2024-04-03 NOTE — ED Notes (Signed)
 Was just informed by CCMD that all central monitoring is down starting at 1500 for approximately 2hrs, so we will be monitoring our own patients.

## 2024-04-03 NOTE — Assessment & Plan Note (Signed)
 Patient was recently prescribed Keflex for outpatient treatment of UTI, though has not been able to tolerate due to N/V.  Patient afebrile without leukocytosis. UA in ED today again suggestive of UTI, lactic acid 2.7.  Patient received CTX dose in ED.  - Admit to FMTS, attending Dr. Anders, MedSurg floor - Consider redosing abx to PO cefadroxil if tolerating p.o. - Urine culture pending - Trend lactic acid - Tylenol  as needed - AM BMP

## 2024-04-03 NOTE — ED Triage Notes (Signed)
 Pt BIB [facility] from home c/o vomiting, decreased oral intake, and inability to keep any food/fluids down for 2-3 days. Pt was diagnosed Monday with a UTI. Unclear if pt is currently taking antibiotics. PMHx of stroke with known dysarthria. Pt alert and in no acute distress upon arrival.   EMS meds en route: 4mg  Zofran , LR  BGL 255 RR 26  CO2 22 BP 171/81 NSR 80

## 2024-04-03 NOTE — H&P (Signed)
 Hospital Admission History and Physical Service Pager: [phone]  Patient name: Lauren Craig Medical record number: [id number] Date of Birth: 05-19-1946 Age: 78 y.o. Gender: female  Primary Care Provider: Delores Suzann HERO, MD Consultants: None Code Status: Full, which was confirmed by patient Preferred Emergency Contact:  Contact Information     Name Relation Home Work Mobile   [place] Daughter   [phone]   [place], [place] [phone]      Other Contacts   None on File    Chief Complaint: Nausea/vomiting, UTI  Differential and Medical Decision Making:  Lauren Craig is a 78 y.o. female presenting with nausea and vomiting in the setting of known UTI.  Presentation most likely continued UTI given recent known infection with limited antibiotic treatment due to vomiting, with UA suggestive of UTI again today.  Nausea and vomiting possibly secondary to poor antibiotic tolerance versus component of chronic nausea and vomiting versus viral gastroenteritis.  Unlikely acute abdomen given reassuring abdominal exam on arrival.  Assessment & Plan UTI (urinary tract infection) Patient was recently prescribed Keflex for outpatient treatment of UTI, though has not been able to tolerate due to N/V.  Patient afebrile without leukocytosis. UA in ED today again suggestive of UTI, lactic acid 2.7.  Patient received CTX dose in ED.  - Admit to FMTS, attending Dr. Anders, MedSurg floor - Consider redosing abx to PO cefadroxil if tolerating p.o. - Urine culture pending - Trend lactic acid - Tylenol  as needed - AM BMP Nausea & vomiting Differential as above.  Exam reassuring against acute abdomen.  Patient received IV Zofran  in ED with improvement of symptoms.  Also received 1L fluid bolus in ED, appears well-hydrated at this time.  Mildly reduced bicarb 19 on admission, likely secondary to and/V.  Creatinine appears around baseline. - Oral  rehydration - consider IVF as indicated - IV Compazine  as needed - repeat a.m. EKG to follow Qtc - Continue home Pepcid  daily - AM BMP Dysphagia Dysarthria History of CVA (cerebrovascular accident) Per patient's daughter, patient with chronic intermittent dysphagia, dysarthria, and weakness since CVA in 2023. - SLP eval/treat - PT/OT eval/treat - Continue home aspirin  81 daily  Chronic and Stable Conditions: HTN: BP elevated on arrival.  Continue home amlodipine  10 daily, clonidine  0.1 twice daily, hydralazine  50 twice daily.  Hold home Lasix . HLD: Continue home rosuvastatin  10 daily, Zetia  10 daily T2DM: Not on medications at baseline, well-controlled.  CBG twice daily. Anxiety: Continue home Lexapro  20 daily BLE venous stasis: Appears stable at this time, not infectious.  Routine wound care.  FEN/GI: Regular diet VTE Prophylaxis: Lovenox  for CrCl< 30  Disposition: MedSurg  History of Present Illness:  Lauren Craig is a 78 y.o. female presenting with continued known UTI symptoms with nausea and vomiting over the past few days.  Patient was started on antibiotic treatment for UTI over the last week though has not been able to keep much of the antibiotics down due to nausea and vomiting.  Has had increasingly looser bowel movements, last bowel movement yesterday.  Patient with reduced appetite.  Was having some epigastric pain, none current.  No recent fever.  Per daughter, patient with intermittent dysphagia and dysarthria since her stroke in 2023, interested in speech and therapy evaluation, and eventual SNF dispo.  In the ED, patient arrived afebrile without leukocytosis, satting well on room air.  UA suggestive of UTI.  Received IV Zofran , 1L bolus IVF, CTX x 1.  Review Of Systems: Per  HPI   Pertinent Past Medical History: CVA 2023 HTN PVD T2DM Chronic nausea, vomiting Anxiety HLD BLE venous stasis  Pertinent Past Surgical History: EGD in 2022 Abdominal aortogram  2019 Hysterectomy Remainder reviewed in history tab.   Pertinent Social History: Tobacco use: Former, quit in 2004 Alcohol use: Occasional Other Substance use: None Lives with daughter  Pertinent Family History: Mother: T2DM, cerebrovascular disease Father: HTN Brother: T2DM  Important Outpatient Medications: Albuterol  as needed Amlodipine  10 daily Aspirin  81 daily Clonidine  0.1 twice daily Lexapro  20 daily Ezetimibe  10 daily Famotidine  10 daily Hydralazine  50 twice daily Rosuvastatin  10 daily  Objective: BP (!) 181/72   Pulse 76   Temp 97.8 F (36.6 C) (Oral)   Resp 16   Ht 5' 5 (1.651 m)   Wt 66.8 kg   SpO2 97%   BMI 24.51 kg/m  Exam: General: No acute distress. Resting comfortably in room. CV: Normal S1/S2. No extra heart sounds. Warm and well-perfused. Pulm: Breathing comfortably on room air. CTAB. No increased WOB. Abd: Soft, non-tender, non-distended. Skin:  Warm, dry. BLE with chronic venous stasis changes, non-infectious appearing, without significant edema.  Psych: Pleasant and appropriate.    Labs:  CBC BMET  Recent Labs  Lab 04/03/24 1400  WBC 8.7  HGB 13.7  HCT 45.1  PLT 355   Recent Labs  Lab 04/03/24 1400  NA 138  K 3.9  CL 103  CO2 19*  BUN 47*  CREATININE 2.06*  GLUCOSE 146*  CALCIUM  9.5     Urinalysis    Component Value Date/Time   COLORURINE YELLOW 04/03/2024 1641   APPEARANCEUR CLOUDY (A) 04/03/2024 1641   LABSPEC 1.013 04/03/2024 1641   PHURINE 5.0 04/03/2024 1641   GLUCOSEU NEGATIVE 04/03/2024 1641   HGBUR SMALL (A) 04/03/2024 1641   BILIRUBINUR NEGATIVE 04/03/2024 1641   KETONESUR NEGATIVE 04/03/2024 1641   PROTEINUR >=300 (A) 04/03/2024 1641   NITRITE NEGATIVE 04/03/2024 1641   LEUKOCYTESUR LARGE (A) 04/03/2024 1641   Lactic acid 2.7  EKG: No acute ischemic findings. Qtc 500.    Imaging Studies Performed: N/a   Diona Perkins, MD 04/03/2024, 5:43 PM PGY-2, [facility]  FPTS Intern  pager: [phone], text pages welcome Secure chat group [facility] Teaching Service

## 2024-04-04 ENCOUNTER — Encounter (HOSPITAL_COMMUNITY): Payer: Self-pay | Admitting: Family Medicine

## 2024-04-04 DIAGNOSIS — R112 Nausea with vomiting, unspecified: Secondary | ICD-10-CM

## 2024-04-04 DIAGNOSIS — N390 Urinary tract infection, site not specified: Secondary | ICD-10-CM | POA: Diagnosis not present

## 2024-04-04 DIAGNOSIS — R109 Unspecified abdominal pain: Secondary | ICD-10-CM | POA: Diagnosis not present

## 2024-04-04 LAB — BASIC METABOLIC PANEL WITH GFR
Anion gap: 9 (ref 5–15)
BUN: 40 mg/dL — ABNORMAL HIGH (ref 8–23)
CO2: 23 mmol/L (ref 22–32)
Calcium: 8.7 mg/dL — ABNORMAL LOW (ref 8.9–10.3)
Chloride: 109 mmol/L (ref 98–111)
Creatinine, Ser: 1.98 mg/dL — ABNORMAL HIGH (ref 0.44–1.00)
GFR, Estimated: 25 mL/min — ABNORMAL LOW (ref >60)
Glucose, Bld: 137 mg/dL — ABNORMAL HIGH (ref 70–99)
Potassium: 4.5 mmol/L (ref 3.5–5.1)
Sodium: 141 mmol/L (ref 135–145)

## 2024-04-04 LAB — GLUCOSE, CAPILLARY
Glucose-Capillary: 142 mg/dL — ABNORMAL HIGH (ref 70–99)
Glucose-Capillary: 107 mg/dL — ABNORMAL HIGH (ref 70–99)

## 2024-04-04 LAB — URINE CULTURE
Culture: >=100000 — AB
Culture: NO GROWTH

## 2024-04-04 MED ORDER — ONDANSETRON HCL 4 MG/2ML IJ SOLN
4.0000 mg | Freq: Once | INTRAMUSCULAR | Status: AC
  Administered 2024-04-04: 4 mg via INTRAVENOUS
  Filled 2024-04-04: qty 2

## 2024-04-04 MED ORDER — SODIUM CHLORIDE 0.9 % IV SOLN
1.0000 g | Freq: Once | INTRAVENOUS | Status: AC
  Administered 2024-04-04: 1 g via INTRAVENOUS
  Filled 2024-04-04: qty 10

## 2024-04-04 NOTE — Plan of Care (Addendum)
 S: Notified by nurse of elevated systolic blood pressure.  Asymptomatic.  Refusing p.o at the time. Went to evaluate patient at bedside.  When I arrived, nurse tech was cleaning patient from urinary incontinence and this provider stepped in his room to provide privacy.  Once patient was back in bed and dressed, spoke with patient at bedside.  She has mild suprapubic pain, but no other pain.  Does not feel dizzy, no blurry vision or trouble hearing.  Patient was able to take sips of grape juice at bedside, and agreeable to taking her morning medications.  O: General: NAD Respiratory:normal wob on RA Skin: Warm and dry Neuro: Alert, oriented to self and place  Plan: Hypertension - Give amlodipine /clonidine  p.o. now - Continue to monitor - Day team will round this morning  Addendum: Notified patient took medication, but now having nausea.  -IV zofran  4 mg once, caution with prolonged Qtc (but this is effective for her) - SLP consult placed last night

## 2024-04-04 NOTE — Evaluation (Signed)
 Clinical/Bedside Swallow Evaluation Patient Details  Name: Lauren Craig MRN: [id number] Date of Birth: Aug 22, 1945  Today's Date: 04/04/2024 Time: SLP Start Time (ACUTE ONLY): 0944 SLP Stop Time (ACUTE ONLY): 0953 SLP Time Calculation (min) (ACUTE ONLY): 9 min  Past Medical History:  Past Medical History:  Diagnosis Date   Anxiety disorder    Arthritis    hands, hips   C. 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   Vitamin B12 deficiency    Past Surgical History:  Past Surgical History:  Procedure Laterality Date   ABDOMINAL AORTOGRAM W/LOWER EXTREMITY N/A 08/11/2017   Procedure: ABDOMINAL AORTOGRAM W/LOWER EXTREMITY;  Surgeon: Eliza Lonni RAMAN, MD;  Location: [facility] INVASIVE CV LAB;  Service: Cardiovascular;  Laterality: N/A;   ABDOMINAL HYSTERECTOMY     AMPUTATION Left 11/14/2019   Procedure: Left Fifth ray amputation;  Surgeon: Kit Rush, MD;  Location: [facility];  Service: Orthopedics;  Laterality: Left;   AMPUTATION TOE Left 05/17/2018   Procedure: 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;  Surgeon: Kit Rush, MD;  Location: [facility];  Service: Orthopedics;  Laterality: Left;   BIOPSY  10/29/2020   Procedure: BIOPSY;  Surgeon: Abran Rush SAILOR, MD;  Location: [facility] ENDOSCOPY;  Service: Endoscopy;;   ESOPHAGOGASTRODUODENOSCOPY (EGD) WITH PROPOFOL  N/A 10/29/2020   Procedure: ESOPHAGOGASTRODUODENOSCOPY (EGD) WITH PROPOFOL ;  Surgeon: Abran Rush SAILOR, MD;  Location: [facility] END[facility]COPY;  Service: Endoscopy;  Laterality: N/A;   LOOP RECORDER INSERTION N/A 05/31/2022   Procedure: LOOP RECORDER INSERTION;  Surgeon: Inocencio Soyla Lunger, MD;  Location: [facility] INVASIVE CV LAB;  Service:  Cardiovascular;  Laterality: N/A;   PARTIAL HYMENECTOMY  1968   TONSILLECTOMY AND ADENOIDECTOMY     VAGINAL HYSTERECTOMY  1987   HPI:  [name] is a 78 y.o. female presenting with nausea and vomiting in the setting of known UTI.  Presentation most likely continued UTI given recent known infection with limited antibiotic treatment due to vomiting, with UA suggestive of UTI again today.  Nausea and vomiting possibly secondary to poor antibiotic tolerance versus component of chronic nausea and vomiting versus viral gastroenteritis.  Unlikely acute abdomen given reassuring abdominal exam on arrival  Ct Ab 11/3: Mild mosaic attenuation at the lung bases is likely due to air trapping  Pt with hx CVA and chronic, intermittent dysphagia per family report.  Pt denies hx pna.    Assessment / Plan / Recommendation  Clinical Impression  Pt presents with clinical indicators of pharyngeal dysphagia.  Pt exhbited reflexive cough following thin liquid by straw.  OT in room also observed cough with straw prior to SLP arrival.  Pt tolerated sips of thin liquid by cup without any clinical s/s of aspiration.  Pt exhibited good oral clearance of puree and solid textures without any clinical s/s of aspiration.  Pt is edentulous but typically wears dentures with PO intake which are not available in room. Pt would like softer foods for ease of mastication. Pt reports subjective difficulty swallowing since CVA in 2023.  At that time she had MBSS with recommendations for mechanical soft solids with thin liquids.  Pt denies any hx of pna.  There is no current  chest imaging.  Given symptoms observed with straw sips and pt c/o difficulty swallowing, recommend further evaluation of pharyngeal swallow function via MBSS. Will plan for study next date with pt to continue current diet at this time.    Recommend mechanical soft solid diet with thin liquids by cup sip; avoid straws.   SLP Visit Diagnosis: Dysphagia, unspecified  (R13.10)    Aspiration Risk  Mild aspiration risk    Diet Recommendation Dysphagia 3 (Mech soft);Thin liquid    Liquid Administration via: Cup;No straw Medication Administration:  (As tolerated, no specific precautions; if coughing observed with meds with thin liquid wash, consider giving meds whole with puree) Supervision: Intermittent supervision to cue for compensatory strategies Compensations: Slow rate;Small sips/bites Postural Changes: Seated upright at 90 degrees    Other  Recommendations Oral Care Recommendations: Oral care BID     Assistance Recommended at Discharge  N/A  Functional Status Assessment  (TBD)  Frequency and Duration  (TBD)          Prognosis Prognosis for improved oropharyngeal function:  (TBD)      Swallow Study   General Date of Onset: 04/01/24 HPI: [name] is a 78 y.o. female presenting with nausea and vomiting in the setting of known UTI.  Presentation most likely continued UTI given recent known infection with limited antibiotic treatment due to vomiting, with UA suggestive of UTI again today.  Nausea and vomiting possibly secondary to poor antibiotic tolerance versus component of chronic nausea and vomiting versus viral gastroenteritis.  Unlikely acute abdomen given reassuring abdominal exam on arrival  Ct Ab 11/3: Mild mosaic attenuation at the lung bases is likely due to air trapping  Pt with hx CVA and chronic, intermittent dysphagia per family report.  Pt denies hx pna. Type of Study: Bedside Swallow Evaluation Previous Swallow Assessment: MBS 05/16/22 with recs for mech soft/thin with small sips.  Coughing noted with larger volumes, but only transient penetration on MBS during which pt did not take serial sips to challenge sw fn Diet Prior to this Study: Dysphagia 3 (mechanical soft);Thin liquids (Level 0) Temperature Spikes Noted: No History of Recent Intubation: No Behavior/Cognition: Alert;Cooperative;Pleasant mood Oral Cavity  Assessment: Within Functional Limits Oral Care Completed by SLP: No Oral Cavity - Dentition: Edentulous;Dentures, not available (typically wears with POs) Vision: Functional for self-feeding Self-Feeding Abilities: Able to feed self Patient Positioning: Upright in chair Baseline Vocal Quality: Normal Volitional Cough:  (Fair) Volitional Swallow: Able to elicit (suspect decreased elevation per palpation)    Oral/Motor/Sensory Function Overall Oral Motor/Sensory Function: Mild impairment Facial ROM: Reduced left Facial Symmetry: Abnormal symmetry left Lingual ROM: Within Functional Limits Lingual Symmetry: Within Functional Limits Lingual Strength: Within Functional Limits Velum: Within Functional Limits Mandible: Within Functional Limits   Ice Chips Ice chips: Not tested   Thin Liquid Thin Liquid: Impaired Pharyngeal  Phase Impairments: Cough - Immediate (with straw sip only)    Nectar Thick Nectar Thick Liquid: Not tested   Honey Thick Honey Thick Liquid: Not tested   Puree Puree: Within functional limits Presentation: Spoon   Solid     Solid: Within functional limits Presentation: Self Fed      Anette FORBES Grippe, MA, CCC-SLP Acute Rehabilitation Services Office: [phone] 04/04/2024,10:14 AM

## 2024-04-04 NOTE — [place] FL2 (Signed)
 [place]  MEDICAID FL2 LEVEL OF CARE FORM     IDENTIFICATION  Patient Name: Lauren Craig Birthdate: [birth year] Sex: female Admission Date (Current Location): 04/03/2024  [facility] and [place] Number:  Producer, Television/film/video and Address:  [facility], [address] [place], [place] [address]      Provider Number: [id number]  Attending Physician Name and Address:  Anders Otto DASEN, MD  Relative Name and Phone Number:  Haili Donofrio; Daughter; [phone] and Michon Kaczmarek; Granddaughter; [phone]    Current Level of Care: Hospital Recommended Level of Care: Skilled Nursing Facility Prior Approval Number:    Date Approved/Denied:   PASRR Number: [id number]  Discharge Plan: SNF    Current Diagnoses: Patient Active Problem List   Diagnosis Date Noted   Abdominal pain 04/04/2024   UTI (urinary tract infection) 04/03/2024   Dysphagia 04/03/2024   Dysarthria 04/03/2024   History of CVA (cerebrovascular accident) 04/03/2024   Hypertension 12/01/2023   Normocytic anemia 12/01/2023   Leg swelling 12/01/2023   Bradycardia 12/01/2023   Chronic health problem 12/01/2023   Hyperkalemia 11/30/2023   CKD (chronic kidney disease), stage IV (HCC) 11/30/2023   Goals of care, counseling/discussion 12/16/2022   Mood disorder 11/09/2022   ICH (intracerebral hemorrhage) (HCC) 05/15/2022   Nausea & vomiting 04/05/2022   Senile purpura 02/23/2022   Memory changes 02/23/2022   CKD (chronic kidney disease) 08/27/2021   Arthritis 03/15/2021   Esophagitis 03/15/2021   Renal cyst 12/17/2020   Visual field defect 11/08/2020   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) 11/08/2020   Acute renal failure superimposed on chronic kidney disease 10/28/2020   HTN (hypertension) Difficult to Control with Isolated Systolic HTN  10/03/2017   Controlled diabetes mellitus type 2 with complications (HCC) 10/03/2017   PVD  (peripheral vascular disease) 2010   Vitamin B12 deficiency 2002    Orientation RESPIRATION BLADDER Height & Weight     Self, Situation, Place, Time  Normal (Room Air) Incontinent Weight: 147 lb 4.3 oz (66.8 kg) Height:  5' 5 (165.1 cm)  BEHAVIORAL SYMPTOMS/MOOD NEUROLOGICAL BOWEL NUTRITION STATUS      Continent Diet (Please see discharge summary)  AMBULATORY STATUS COMMUNICATION OF NEEDS Skin   Limited Assist Verbally Normal                       Personal Care Assistance Level of Assistance  Feeding, Bathing, Dressing Bathing Assistance: Limited assistance Feeding assistance: Limited assistance Dressing Assistance: Limited assistance     Functional Limitations Info             SPECIAL CARE FACTORS FREQUENCY  PT (By licensed PT), OT (By licensed OT), Speech therapy     PT Frequency: 5x OT Frequency: 5x     Speech Therapy Frequency: 3x      Contractures Contractures Info: Not present    Additional Factors Info  Code Status, Allergies, Insulin  Sliding Scale Code Status Info: Full Code Allergies Info: Penicillins; Insulin  Lispro; Insulin  Lispro Prot and Lispro; Semaglutide; Metformin   Insulin  Sliding Scale Info: Please see discharge summary       Current Medications (04/04/2024):  This is the current hospital active medication list Current Facility-Administered Medications  Medication Dose Route Frequency Provider Last Rate Last Admin   acetaminophen  (TYLENOL ) tablet 1,000 mg  1,000 mg Oral Q6H PRN Diona Perkins, MD       albuterol  (PROVENTIL ) (2.5 MG/3ML) 0.083% nebulizer solution 3 mL  3 mL Inhalation Q6H PRN Diona Perkins, MD       amLODipine  (NORVASC ) tablet 10 mg  10 mg Oral Daily Diona Perkins, MD   10 mg at 04/04/24 0551   aspirin  EC tablet 81 mg  81 mg Oral Daily Diona Perkins, MD   81 mg at 04/04/24 1004   cloNIDine  (CATAPRES ) tablet 0.1 mg  0.1 mg Oral BID Diona Perkins, MD   0.1 mg at 04/04/24 0550   enoxaparin  (LOVENOX ) injection 30 mg  30 mg  Subcutaneous Q24H Diona Perkins, MD       escitalopram  (LEXAPRO ) tablet 20 mg  20 mg Oral Daily Diona Perkins, MD   20 mg at 04/04/24 1004   ezetimibe  (ZETIA ) tablet 10 mg  10 mg Oral Daily Diona Perkins, MD   10 mg at 04/04/24 1004   famotidine  (PEPCID ) tablet 10 mg  10 mg Oral Daily Diona Perkins, MD   10 mg at 04/04/24 1004   hydrALAZINE  (APRESOLINE ) tablet 50 mg  50 mg Oral BID Diona Perkins, MD   50 mg at 04/04/24 1004   prochlorperazine  (COMPAZINE ) injection 5 mg  5 mg Intravenous Q6H PRN Diona Perkins, MD       rosuvastatin  (CRESTOR ) tablet 10 mg  10 mg Oral Daily Diona Perkins, MD   10 mg at 04/04/24 1004     Discharge Medications: Please see discharge summary for a list of discharge medications.  Relevant Imaging Results:  Relevant Lab Results:   Additional Information SSN: [id number]  [name], LCSWA

## 2024-04-04 NOTE — TOC Initial Note (Addendum)
 Transition of Care [facility]) - Initial/Assessment Note    Patient Details  Name: Lauren Craig MRN: [id number] Date of Birth: Feb 10, 1946  Transition [facility]) CM/SW Contact:    [name], LCSWA Phone Number: 04/04/2024, 1:46 PM  Clinical Narrative:                  1:46 PM CSW introduced self and role to patient's daughter, Samule. Samule informed CSW that patient currently resides with her as she moved in November 1st from her ILF, [facility] in [place]. Samule informed CSW that she is disabled and unable to care for patient who she states has a cognitive impairment diagnosis. Samule informed CSW that she is currently working with [employer] and DSS (social worker [place], [phone]) in obtaining LTC placement and Medicaid. CSW informed Samule that if insurance authorization does not approved SNF insurance authorization requested then private pay would need to be provided until Medicaid starts. Samule informed CSW that her and patient do not have finances to pay out of pocket for LTC placement. Per chart review, patient has a PCP and insurance. Patient does not have SNF history. Patient has CIR history with [facility]. Patient has HH history with [facility], and [facility]. Patient has DME (BSC, annual wheelchair, rollator, RW, cane, shower seat, sliding board, grab bars, crutches) history with [facility]. Patient's preferred pharmacy's are [facility], [facility], [facility] [address] [facility] [address], and [facility] [address] TOC will continue to follow.  3:08 PM CSW sent patient's FL2 to SNFs in [facility]. CSW will continue to follow.   Expected Discharge Plan: Home w Home Health Services Barriers to Discharge: Continued Medical Work up   Patient Goals and CMS Choice            Expected Discharge Plan and Services       Living arrangements for the past 2 months: Single Family Home                                       Prior Living Arrangements/Services Living arrangements for the past 2 months: Single Family Home Lives with:: Adult Children Patient language and need for interpreter reviewed:: Yes        Need for Family Participation in Patient Care: Yes (Comment)   Current home services: DME Criminal Activity/Legal Involvement Pertinent to Current Situation/Hospitalization: No - Comment as needed  Activities of Daily Living   ADL Screening (condition at time of admission) Independently performs ADLs?: Yes (appropriate for developmental age) Is the patient deaf or have difficulty hearing?: No Does the patient have difficulty seeing, even when wearing glasses/contacts?: No Does the patient have difficulty concentrating, remembering, or making decisions?: No  Permission Sought/Granted Permission sought to share information with : Family Supports Permission granted to share information with : No (Contact information on chart)  Share Information with NAME: Taheera Thomann     Permission granted to share info w Relationship: Daughter  Permission granted to share info w Contact Information: [phone]  Emotional Assessment Appearance:: Appears stated age     Orientation: : Oriented to Self, Oriented to Place, Oriented to  Time, Oriented to Situation Alcohol / Substance Use: Not Applicable Psych Involvement: No (comment)  Admission diagnosis:  UTI (urinary tract infection) [N39.0] Patient Active Problem List   Diagnosis Date Noted   Abdominal pain 04/04/2024   UTI (urinary tract infection)  04/03/2024   Dysphagia 04/03/2024   Dysarthria 04/03/2024   History of CVA (cerebrovascular accident) 04/03/2024   Hypertension 12/01/2023   Normocytic anemia 12/01/2023   Leg swelling 12/01/2023   Bradycardia 12/01/2023   Chronic health problem 12/01/2023   Hyperkalemia 11/30/2023   CKD (chronic kidney disease), stage IV (HCC) 11/30/2023   Goals of care, counseling/discussion 12/16/2022   Mood  disorder 11/09/2022   ICH (intracerebral hemorrhage) (HCC) 05/15/2022   Nausea & vomiting 04/05/2022   Senile purpura 02/23/2022   Memory changes 02/23/2022   CKD (chronic kidney disease) 08/27/2021   Arthritis 03/15/2021   Esophagitis 03/15/2021   Renal cyst 12/17/2020   Visual field defect 11/08/2020   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 [place] [address] Phone: [phone] Fax: [phone]  [id number] [place] [place] - [address] AT [facility] [address] [place] [address] Phone: [phone] Fax: [phone]  [facility] [address] [place] [address] Phone: [phone] Fax: [phone]  [id number] [place], [place] - [address] [place] [address] Phone: [phone] Fax: [phone]     Social Drivers of Health (SDOH) Social History: SDOH Screenings   Food Insecurity: No Food Insecurity (04/03/2024)  Recent Concern: Food Insecurity - Food Insecurity Present (03/08/2024)  Housing: Low Risk  (04/03/2024)  Transportation Needs: No Transportation Needs (04/03/2024)  Utilities: Not At Risk (04/03/2024)  Alcohol Screen: Low Risk  (05/29/2023)  Depression (PHQ2-9): Low Risk  (03/14/2024)  Financial Resource Strain: Low Risk  (05/29/2023)  Physical Activity: Inactive (05/29/2023)  Social Connections: Moderately Isolated (04/03/2024)  Stress:  Stress Concern Present (02/15/2021)  Tobacco Use: Medium Risk (04/04/2024)  Health Literacy: Adequate Health Literacy (05/29/2023)   SDOH Interventions:     Readmission Risk Interventions    12/05/2023   12:21 PM  Readmission Risk Prevention Plan  Transportation Screening Complete  PCP or Specialist Appt within 5-7 Days Complete  Home Care Screening Complete  Medication Review (RN CM) Referral to Pharmacy

## 2024-04-04 NOTE — Evaluation (Addendum)
 Physical Therapy Evaluation Patient Details Name: Lauren Craig MRN: [id number] DOB: 29-Sep-1945 Today's Date: 04/04/2024  History of Present Illness  Pt is a 78 y.o. female who presented due to n/v and UTI.  PMH: of HTN, Esophagitis, HLD, PVD, CVA, DM2, L 5th toe amp, and B12 deficiency  Clinical Impression  Pt in bed upon arrival of PT, agreeable to evaluation at this time. Prior to admission the pt was ambulating without DME in her room but with RW in community, reports active at activities at her assisted living facility, not receiving assist for ADLs. The pt was able to complete sit-stand transfers and short bout of ambulation in the room with RW and CGA, pt with limited endurance due to fatigue and needing increased cues for use of DME and management in the room. Pt will continue to benefit from skilled PT acutely with goal of progressing back to being able to return to ALF with HHPT, will benefit from additional session of acute PT prior to return home. Pt with deficits in endurance and dynamic stability compared to her baseline, is at elevated risk of falls.   Addendum 2:06PM: After discussion with Child Psychotherapist, pt is from home with daughter rather than from ALF and that the daughter is unable to provide physical assistance due to her own disability. Given pt's need for assist with ADLs and limited mobility, would benefit from continued skilled PT <3hours/day in inpatient setting after d/c as pt lacks the assist she would need to safely manage with self-care and mobility at her daughter's house.   VITALS:  - sitting in recliner - BP: 129/50 (72); HR: 66bpm - sitting in recliner after gait - BP: 145/45 (75); HR: 65bpm - sitting in recliner after session - BP: 155/57 (83); HR: 62bpm    If plan is discharge home, recommend the following: A little help with walking and/or transfers;A little help with bathing/dressing/bathroom;Assistance with cooking/housework;Direct supervision/assist for  medications management;Direct supervision/assist for financial management;Assist for transportation;Help with stairs or ramp for entrance;Supervision due to cognitive status   Can travel by private vehicle   Yes    Equipment Recommendations None recommended by PT  Recommendations for Other Services       Functional Status Assessment Patient has had a recent decline in their functional status and demonstrates the ability to make significant improvements in function in a reasonable and predictable amount of time.     Precautions / Restrictions Precautions Precautions: Fall Recall of Precautions/Restrictions: Intact Restrictions Weight Bearing Restrictions Per Provider Order: No      Mobility  Bed Mobility Overal bed mobility: Needs Assistance             General bed mobility comments: OOB in recliner at start and end of session    Transfers Overall transfer level: Needs assistance Equipment used: Rolling walker (2 wheels) Transfers: Sit to/from Stand, Bed to chair/wheelchair/BSC Sit to Stand: Contact guard assist   Step pivot transfers: Contact guard assist       General transfer comment: CGA for safety, cues for use of RW, able to maange steps to [facility] without assist    Ambulation/Gait Ambulation/Gait assistance: Contact guard assist, Min assist Gait Distance (Feet): 35 Feet Assistive device: Rolling walker (2 wheels) Gait Pattern/deviations: Step-through pattern, Decreased stride length Gait velocity: decreased Gait velocity interpretation: <1.31 ft/sec, indicative of household ambulator   General Gait Details: pt with slow but steady gait, limited to within room due to fatigue, reports she is close to her baseline gait  Balance Overall balance assessment: Needs assistance Sitting-balance support: Feet supported Sitting balance-Leahy Scale: Good     Standing balance support: Bilateral upper extremity supported Standing balance-Leahy Scale:  Fair Standing balance comment: reliant on device                             Pertinent Vitals/Pain Pain Assessment Pain Assessment: No/denies pain Breathing: normal Negative Vocalization: none Facial Expression: smiling or inexpressive Body Language: relaxed Consolability: no need to console PAINAD Score: 0 Pain Intervention(s): Limited activity within patient's tolerance, Monitored during session, Repositioned    Home Living Family/patient expects to be discharged to:: Assisted living Living Arrangements: Children               Home Equipment: Rollator (4 wheels);Cane - single point;Shower seat;Other (comment) (adjustable bed) Additional Comments: pt confirmed they live at [facility]F    Prior Function Prior Level of Function : Independent/Modified Independent             Mobility Comments: reports using a rollator primarily, walks in her room without DME but holding furniture, dtr drives her to MD appts ADLs Comments: Dtr assists with med mgt. Pt Ind with bathing, dressing, toileting     Extremity/Trunk Assessment   Upper Extremity Assessment Upper Extremity Assessment: Defer to OT evaluation    Lower Extremity Assessment Lower Extremity Assessment: Generalized weakness (grossly 4+/5 to MMT)    Cervical / Trunk Assessment Cervical / Trunk Assessment: Kyphotic  Communication   Communication Communication: Impaired Factors Affecting Communication: Reduced clarity of speech    Cognition Arousal: Alert Behavior During Therapy: WFL for tasks assessed/performed   PT - Cognitive impairments: No family/caregiver present to determine baseline, Orientation, Awareness, Problem solving, Safety/Judgement   Orientation impairments: Time (states date is october 2nd, no guess for day of week)                   PT - Cognition Comments: pt needing increased time to follow some cues but generally WFL in session, not formally assessed Following  commands: Intact       Cueing Cueing Techniques: Verbal cues     General Comments General comments (skin integrity, edema, etc.): VSS on RA, BP slighly elevated after session        Assessment/Plan    PT Assessment Patient needs continued PT services  PT Problem List Decreased strength;Decreased activity tolerance;Decreased balance;Decreased mobility       PT Treatment Interventions DME instruction;Gait training;Functional mobility training;Therapeutic activities;Balance training;Therapeutic exercise;Neuromuscular re-education;Patient/family education    PT Goals (Current goals can be found in the Care Plan section)  Acute Rehab PT Goals Patient Stated Goal: to return home PT Goal Formulation: With patient Time For Goal Achievement: 04/18/24 Potential to Achieve Goals: Good    Frequency Min 2X/week        AM-PAC PT 6 Clicks Mobility  Outcome Measure Help needed turning from your back to your side while in a flat bed without using bedrails?: A Little Help needed moving from lying on your back to sitting on the side of a flat bed without using bedrails?: A Little Help needed moving to and from a bed to a chair (including a wheelchair)?: A Little Help needed standing up from a chair using your arms (e.g., wheelchair or bedside chair)?: A Little Help needed to walk in hospital room?: A Little Help needed climbing 3-5 steps with a railing? : A Lot 6 Click Score: 17  End of Session Equipment Utilized During Treatment: Gait belt Activity Tolerance: Patient tolerated treatment well Patient left: in chair;with call bell/phone within reach;with chair alarm set Nurse Communication: Mobility status PT Visit Diagnosis: Unsteadiness on feet (R26.81);Other abnormalities of gait and mobility (R26.89);Muscle weakness (generalized) (M62.81)    Time: 8852-8790 PT Time Calculation (min) (ACUTE ONLY): 22 min   Charges:   PT Evaluation $PT Eval Moderate Complexity: 1 Mod   PT  General Charges $$ ACUTE PT VISIT: 1 Visit         Izetta Call, PT, DPT   Acute Rehabilitation Department Office [phone] Secure Chat Communication Preferred  Izetta JULIANNA Call 04/04/2024, 2:06 PM

## 2024-04-04 NOTE — Progress Notes (Signed)
     Daily Progress Note Intern Pager: [phone]  Patient name: Lauren Craig Medical record number: [id number] Date of birth: August 06, 1945 Age: 78 y.o. Gender: female  Primary Care Provider: Delores Suzann HERO, MD Consultants: None Code Status: Full Code  Pt Overview and Major Events to Date:  11/5 : Admitted  Ms. Bear is a 78 y.o. female presenting with nausea and vomiting in the setting of known UTI.   Assessment and Plan: Assessment & Plan UTI (urinary tract infection) Normal WBC. Patient was recently prescribed Keflex (11/3 -) and Doxycycline (10/18-11/1) for outpatient treatment of UTI, - Consider redosing abx to PO cefadroxil if tolerating p.o. - Urine culture pending - Pain : Tylenol  650 mg PO Q6H PRN Nausea & vomiting Improved. Denies N/V since admission - PRN IV Zofran , Compazine  - Continue home Pepcid  daily - Daily AM BMP Dysphagia Per patient's daughter, patient with chronic intermittent dysphagia, dysarthria, and weakness since CVA in 2023.  - SLP consulted for speech evaluation Chronic health problem Home medications hold until medications reconcile by pharmacy Asthma : Hold home ventolin  HFA, continue PROVENTIL  neb Q6H PRN HTN : Continue home NORVASC  10 mg Daily, Continue cloNIDINE  0.1 tab BID, continue home HydraALAZINE 50 mg BID DMII : BCG x4 CKD : Continue ASA 81 mg Daily Hold Lasix  20 mg BID CVA - left MCA stenosis  PVD Mood disorder : Continue LEXAPRO  20 mg Daily HLD : Continue home CRESTOR  10 mg daily, ZETIA  10 mg Daily  FEN/GI: Dysphagia diet w/ thin diet PPx: Lovenox  Dispo:Pending PT recommendations  pending clinical improvement   Subjective:  NAEON, state N/V improved since admitted. Denies N/V, abdominal pain, fever, SOB, or pain  Objective: Temp:  [97.6 F (36.4 C)-98.1 F (36.7 C)] 97.6 F (36.4 C) (11/06 0733) Pulse Rate:  [62-79] 62 (11/06 0733) Resp:  [15-21] 16 (11/06 0733) BP: (137-201)/(59-82) 137/59 (11/06 0733) SpO2:  [96 %-100  %] 99 % (11/06 0733) Weight:  [66.8 kg] 66.8 kg (11/05 1420) Physical Exam: General: No distress, Non toxic, appear as age Cardiovascular: S1S2 RRR Respiratory: No distress on RA Abdomen: Soft, no tenderness on palpation, last BM 04/03/24  Laboratory: Most recent CBC Lab Results  Component Value Date   WBC 8.7 04/03/2024   HGB 13.7 04/03/2024   HCT 45.1 04/03/2024   MCV 83.4 04/03/2024   PLT 355 04/03/2024   Most recent BMP    Latest Ref Rng & Units 04/04/2024    4:04 AM  BMP  Glucose 70 - 99 mg/dL 862   BUN 8 - 23 mg/dL 40   Creatinine 9.55 - 1.00 mg/dL 8.01   Sodium 864 - 854 mmol/L 141   Potassium 3.5 - 5.1 mmol/L 4.5   Chloride 98 - 111 mmol/L 109   CO2 22 - 32 mmol/L 23   Calcium  8.9 - 10.3 mg/dL 8.7      Imaging/Diagnostic Tests: None  Suzen Houston NOVAK, DO 04/04/2024, 10:00 AM  PGY-1, [facility] FPTS Intern pager: [phone], text pages welcome Secure chat group [facility] Teaching Service

## 2024-04-04 NOTE — Assessment & Plan Note (Signed)
 Per patient's daughter, patient with chronic intermittent dysphagia, dysarthria, and weakness since CVA in 2023.  - SLP consulted for speech evaluation

## 2024-04-04 NOTE — Assessment & Plan Note (Signed)
 Normal WBC. Patient was recently prescribed Keflex (11/3 -) and Doxycycline (10/18-11/1) for outpatient treatment of UTI, - Consider redosing abx to PO cefadroxil if tolerating p.o. - Urine culture pending - Pain : Tylenol  650 mg PO Q6H PRN

## 2024-04-04 NOTE — Assessment & Plan Note (Signed)
 Improved. Denies N/V since admission - PRN IV Zofran , Compazine  - Continue home Pepcid  daily - Daily AM BMP

## 2024-04-04 NOTE — Evaluation (Signed)
 Occupational Therapy Evaluation Patient Details Name: Lauren Craig MRN: [id number] DOB: [birth year] Today's Date: 04/04/2024   History of Present Illness   Pt is a 78 y.o. female who presented due to n/v and UTI.  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 However, she also reported there are no staff that can assist with ADLS as needed. At this time evaluation was limited due to BP and nurse was notified in session. Pt agreed to go to chair with RW and CGA and step pivot to chair and set up with wash cloth to wipe face. At this time pending recommendation depending on progress of SNF vs HH.   BP Sitting: 171/69 (97 hr 68 Repeat Sitting: 173/63 (94) hr 94 Repeat Sitting 162/73 (94)     If plan is discharge home, recommend the following:   A little help with walking and/or transfers;A little help with bathing/dressing/bathroom;Assistance with cooking/housework;Direct supervision/assist for medications management;Direct supervision/assist for financial management;Assist for transportation     Functional Status Assessment   Patient has had a recent decline in their functional status and demonstrates the ability to make significant improvements in function in a reasonable and predictable amount of time.     Equipment Recommendations   None recommended by OT     Recommendations for Other Services         Precautions/Restrictions   Precautions Precautions: Fall Recall of Precautions/Restrictions: Intact Restrictions Weight Bearing Restrictions Per Provider Order: No     Mobility Bed Mobility Overal bed mobility: Needs Assistance Bed Mobility: Supine to Sit     Supine to sit: Contact guard          Transfers Overall transfer level: Needs  assistance Equipment used: Rolling walker (2 wheels) Transfers: Sit to/from Stand Sit to Stand: Contact guard assist                  Balance Overall balance assessment: Needs assistance Sitting-balance support: Feet supported Sitting balance-Leahy Scale: Good     Standing balance support: Bilateral upper extremity supported Standing balance-Leahy Scale: Fair Standing balance comment: reliant on device                           ADL either performed or assessed with clinical judgement   ADL Overall ADL's : Needs assistance/impaired Eating/Feeding: Set up;Sitting   Grooming: Wash/dry face;Set up;Sitting   Upper Body Bathing: Set up;Sitting       Upper Body Dressing : Set up;Sitting       Toilet Transfer: Contact guard assist;Cueing for safety;Cueing for sequencing;Rolling walker (2 wheels) Toilet Transfer Details (indicate cue type and reason): step pivot to chair         Functional mobility during ADLs: Contact guard assist;Cueing for safety;Cueing for sequencing;Rolling walker (2 wheels)       Vision         Perception         Praxis         Pertinent Vitals/Pain Pain Assessment Pain Assessment: Faces Faces Pain Scale: Hurts little more Breathing: normal Negative Vocalization: none Facial Expression: smiling or inexpressive Body Language: relaxed Consolability: no need to console PAINAD Score: 0 Pain Location: stomache, pt would rub occasionally in session Pain Descriptors /  Indicators: Aching Pain Intervention(s): Limited activity within patient's tolerance, Monitored during session     Extremity/Trunk Assessment Upper Extremity Assessment Upper Extremity Assessment: Generalized weakness (edema at R elbow)   Lower Extremity Assessment Lower Extremity Assessment: Defer to PT evaluation   Cervical / Trunk Assessment Cervical / Trunk Assessment: Kyphotic   Communication     Cognition Arousal: Alert Behavior During  Therapy: [facility] for tasks assessed/performed                                         Cueing  General Comments          Exercises     Shoulder Instructions      Home Living Family/patient expects to be discharged to:: Assisted living                   Bathroom Shower/Tub: Walk-in shower         Home Equipment: Rollator (4 wheels);Cane - single point;Shower seat;Other (comment) (adjustable bed)   Additional Comments: pt confirmed they live at [facility]F      Prior Functioning/Environment Prior Level of Function : Independent/Modified Independent             Mobility Comments: reports using a rollator primarily, dtr drives her to MD appts ADLs Comments: Dtr assists with med mgt. Pt Ind with bathing, dressing, toileting    OT Problem List: Decreased strength;Decreased activity tolerance;Impaired vision/perception;Decreased safety awareness;Decreased knowledge of use of DME or AE;Pain   OT Treatment/Interventions: Self-care/ADL training;Therapeutic exercise;DME and/or AE instruction;Therapeutic activities;Patient/family education;Balance training      OT Goals(Current goals can be found in the care plan section)   Acute Rehab OT Goals Patient Stated Goal: to get better OT Goal Formulation: With patient Time For Goal Achievement: 04/18/24 Potential to Achieve Goals: Fair   OT Frequency:  Min 2X/week    Co-evaluation              AM-PAC OT 6 Clicks Daily Activity     Outcome Measure Help from another person eating meals?: None Help from another person taking care of personal grooming?: A Little Help from another person toileting, which includes using toliet, bedpan, or urinal?: A Little Help from another person bathing (including washing, rinsing, drying)?: A Little Help from another person to put on and taking off regular upper body clothing?: None Help from another person to put on and taking off regular lower body clothing?:  A Little 6 Click Score: 20   End of Session Equipment Utilized During Treatment: Gait belt;Rolling walker (2 wheels) Nurse Communication: Mobility status (bp)  Activity Tolerance: Other (comment) (limited due to bp) Patient left: in chair;with call bell/phone within reach;with chair alarm set  OT Visit Diagnosis: Unsteadiness on feet (R26.81);Other abnormalities of gait and mobility (R26.89);Repeated falls (R29.6);Muscle weakness (generalized) (M62.81);Pain Pain - part of body:  (stomache)                Time: 0930-1000 OT Time Calculation (min): 30 min Charges:  OT General Charges $OT Visit: 1 Visit OT Treatments $Self Care/Home Management : 23-37 mins  Warrick POUR OTR/L  Acute Rehab Services  [phone] office number   Warrick Berber 04/04/2024, 10:21 AM

## 2024-04-04 NOTE — Assessment & Plan Note (Signed)
 Home medications hold until medications reconcile by pharmacy Asthma : Hold home ventolin  HFA, continue PROVENTIL  neb Q6H PRN HTN : Continue home NORVASC  10 mg Daily, Continue cloNIDINE  0.1 tab BID, continue home HydraALAZINE 50 mg BID DMII : BCG x4 CKD : Continue ASA 81 mg Daily Hold Lasix  20 mg BID CVA - left MCA stenosis  PVD Mood disorder : Continue LEXAPRO  20 mg Daily HLD : Continue home CRESTOR  10 mg daily, ZETIA  10 mg Daily

## 2024-04-05 ENCOUNTER — Telehealth: Payer: Self-pay | Admitting: Family Medicine

## 2024-04-05 ENCOUNTER — Inpatient Hospital Stay (HOSPITAL_COMMUNITY)

## 2024-04-05 DIAGNOSIS — N3000 Acute cystitis without hematuria: Secondary | ICD-10-CM

## 2024-04-05 LAB — BASIC METABOLIC PANEL WITH GFR
Anion gap: 9 (ref 5–15)
BUN: 44 mg/dL — ABNORMAL HIGH (ref 8–23)
CO2: 23 mmol/L (ref 22–32)
Calcium: 7.8 mg/dL — ABNORMAL LOW (ref 8.9–10.3)
Chloride: 102 mmol/L (ref 98–111)
Creatinine, Ser: 2.2 mg/dL — ABNORMAL HIGH (ref 0.44–1.00)
GFR, Estimated: 22 mL/min — ABNORMAL LOW (ref >60)
Glucose, Bld: 120 mg/dL — ABNORMAL HIGH (ref 70–99)
Potassium: 4.3 mmol/L (ref 3.5–5.1)
Sodium: 134 mmol/L — ABNORMAL LOW (ref 135–145)

## 2024-04-05 LAB — GLUCOSE, CAPILLARY: Glucose-Capillary: 129 mg/dL — ABNORMAL HIGH (ref 70–99)

## 2024-04-05 MED ORDER — HYDROCERIN EX CREA
TOPICAL_CREAM | Freq: Every day | CUTANEOUS | Status: DC
  Administered 2024-04-05: 1 via TOPICAL
  Filled 2024-04-05 (×2): qty 113

## 2024-04-05 MED ORDER — ENSURE PLUS HIGH PROTEIN PO LIQD
237.0000 mL | Freq: Two times a day (BID) | ORAL | Status: DC
  Administered 2024-04-06 – 2024-04-07 (×2): 237 mL via ORAL

## 2024-04-05 MED ORDER — FUROSEMIDE 40 MG PO TABS: 40.0000 mg | ORAL_TABLET | Freq: Two times a day (BID) | ORAL | Status: DC

## 2024-04-05 MED ORDER — NITROFURANTOIN MACROCRYSTAL 50 MG PO CAPS
100.0000 mg | ORAL_CAPSULE | Freq: Two times a day (BID) | ORAL | Status: DC
  Administered 2024-04-05 – 2024-04-07 (×5): 100 mg via ORAL
  Filled 2024-04-05 (×3): qty 2
  Filled 2024-04-05: qty 1
  Filled 2024-04-05 (×2): qty 2
  Filled 2024-04-05 (×2): qty 1
  Filled 2024-04-05: qty 2

## 2024-04-05 MED ORDER — ADULT MULTIVITAMIN W/MINERALS CH
1.0000 | ORAL_TABLET | Freq: Every day | ORAL | Status: DC
  Administered 2024-04-05 – 2024-04-07 (×3): 1 via ORAL
  Filled 2024-04-05 (×3): qty 1

## 2024-04-05 NOTE — Procedures (Signed)
 Modified Barium Swallow Study  Patient Details  Name: Lauren Craig MRN: [id number] Date of Birth: 1946-02-14  Today's Date: 04/05/2024  Modified Barium Swallow completed.  Full report located under Chart Review in the Imaging Section.  History of Present Illness Lauren Craig is a 78 y.o. female presenting with nausea and vomiting in the setting of known UTI.  Presentation most likely continued UTI given recent known infection with limited antibiotic treatment due to vomiting, with UA suggestive of UTI again today.  Nausea and vomiting possibly secondary to poor antibiotic tolerance versus component of chronic nausea and vomiting versus viral gastroenteritis.  Unlikely acute abdomen given reassuring abdominal exam on arrival  Ct Ab 11/3: Mild mosaic attenuation at the lung bases is likely due to air trapping  Pt with hx CVA and chronic, intermittent dysphagia per family report.  Pt denies hx pna.   Clinical Impression Pt presents with oropharyngeal swallow within functional limits.  Swallow intitiation delayed to pyriform sinuses.  There was mild vallecula residue 2/2 reduced base of tongue retraction.  There was no penetration or aspiration of any consistencies trialed, including straw sips of thin liquid.  Coughing with straws observed during clinical assessment was not present during this study. Pt initially with poor acceptance of barium contrast (suspected to be related to distaste) which resulted in anterior spillage x1, but improved with straw presentation.  During pill simulation there slowed clearance of barium tablet through esophagus, but providing liquid wash and following with puree was beneficial to move pill through esophagus.  Suspect mildly reduced UES opening duration and dilation, but with adequate flow of boluses into esophagus for volumes taken today.  Pt has no further ST needs.  SLP will sign off.    Recommend continuing mechanical soft diet 2/2 edentulism with thin  liquids. Pt would be safe to advance to regular texture diet if desired.  DIGEST Swallow Severity Rating*  Safety: 0  Efficiency: 0  Overall Pharyngeal Swallow Severity: 0 1: mild; 2: moderate; 3: severe; 4: profound  *The Dynamic Imaging Grade of Swallowing Toxicity is standardized for the head and neck cancer population, however, demonstrates promising clinical applications across populations to standardize the clinical rating of pharyngeal swallow safety and severity.   Factors that may increase risk of adverse event in presence of aspiration Noe & Lianne 2021): Reduced cognitive function  Swallow Evaluation Recommendations Recommendations: PO diet PO Diet Recommendation: Dysphagia 3 (Mechanical soft);Thin liquids (Level 0) Liquid Administration via: Cup;Straw Medication Administration: Whole meds with liquid Supervision: Staff to assist with self-feeding (as needed) Swallowing strategies  : Slow rate;Small bites/sips Postural changes: Position pt fully upright for meals Oral care recommendations: Oral care BID (2x/day)      Anette FORBES Grippe, MA, CCC-SLP Acute Rehabilitation Services Office: [phone] 04/05/2024,11:38 AM

## 2024-04-05 NOTE — Progress Notes (Addendum)
     Daily Progress Note Intern Pager: [phone]  Patient name: Lauren Craig Medical record number: [id number] Date of birth: [birth year] Age: 78 y.o. Gender: female  Primary Care Provider: Delores Suzann HERO, MD Consultants: None Code Status: Full Code  Pt Overview and Major Events to Date:  11/5 : Admitted   Medical Decision Making:  Lauren Craig is 78 y.o. female admitted for intractable nausea and vomiting in the setting of known UTI. Has improved with IV antibiotics and has now transitioned to oral. Medically stable for discharge and awaiting SNF. Pertinent PMH/PSH includes Asthma, HTN, T2DM, CKD, Hx of CVA, PVD.  Assessment & Plan UTI (urinary tract infection) Remains afebrile with improved white count.  Failed outpatient antibiotics due to nausea and vomiting. - Continue PO Nitrofurantoin 100 mg BID for total of 5 days ( 11/7-11/11) - Urine culture with no growth to date - Pain : Tylenol  650 mg PO Q6H PRN Nausea & vomiting (Resolved: 04/06/2024) Improved. Denies N/V since admission. Able to [employer]. - Stop IV as needed Compazine  - Can order Zofran  ODT if needed - Continue home Pepcid  daily - Daily AM BMP Dysphagia Per patient's daughter, patient with chronic intermittent dysphagia, dysarthria, and weakness since CVA in 2023.  - SLP consulted for speech evaluation : recommending D3 diet w/ thin liquid  Chronic health problem Asthma : Hold home ventolin  HFA, continue PROVENTIL  neb Q6H PRN HTN : Continue home NORVASC  10 mg Daily, Continue cloNIDINE  0.1 tab BID, continue home HydraALAZINE 50 mg BID, and  DMII : BCG x4 CKD : Continue ASA 81 mg Daily Hold Lasix  20 mg BID CVA - left MCA stenosis  PVD Mood disorder : Continue LEXAPRO  20 mg Daily HLD : Continue home CRESTOR  10 mg daily, ZETIA  10 mg Daily   FEN/GI: Dysphagia 3 PPx: Lovenox  Dispo: Pending SNF placement  Subjective:  No acute events overnight.  Patient reports no issues or complaints at this time.   Patient sleeping and declined further questioning.  Objective: Temp:  [97.4 F (36.3 C)-97.7 F (36.5 C)] 97.4 F (36.3 C) (11/07 1521) Pulse Rate:  [48-51] 49 (11/07 1521) Resp:  [14-16] 16 (11/07 1521) BP: (136-189)/(44-60) 136/44 (11/07 1521) SpO2:  [97 %-100 %] 100 % (11/07 1521) Physical Exam: General: NAD, lying comfortably in hospital bed Neuro: A&O, easily arousable to voice, responds appropriately to questions Cardiovascular: RRR, no murmurs,  Respiratory: normal WOB on RA, CTAB, no wheezes, ronchi or rales Abdomen: soft, NTTP, no rebound or guarding Extremities: Moving all 4 extremities equally   Laboratory: Most recent CBC Lab Results  Component Value Date   WBC 8.7 04/03/2024   HGB 13.7 04/03/2024   HCT 45.1 04/03/2024   MCV 83.4 04/03/2024   PLT 355 04/03/2024   Most recent BMP    Latest Ref Rng & Units 04/05/2024    5:22 AM  BMP  Glucose 70 - 99 mg/dL 879   BUN 8 - 23 mg/dL 44   Creatinine 9.55 - 1.00 mg/dL 7.79   Sodium 864 - 854 mmol/L 134   Potassium 3.5 - 5.1 mmol/L 4.3   Chloride 98 - 111 mmol/L 102   CO2 22 - 32 mmol/L 23   Calcium  8.9 - 10.3 mg/dL 7.8     Imaging/Diagnostic Tests: No new imaging.  Lauren Sharper, MD 04/05/2024, 10:46 PM  PGY-3, [facility] Family Medicine FPTS Intern pager: [phone], text pages welcome Secure chat group [facility] Teaching Service

## 2024-04-05 NOTE — Progress Notes (Signed)
 Mobility Specialist Progress Note:    04/05/24 1345  Mobility  Activity Ambulated with assistance (In room)  Level of Assistance Contact guard assist, steadying assist  Assistive Device Front wheel walker  Distance Ambulated (ft) 30 ft  Activity Response Tolerated well  Mobility Referral Yes  Mobility visit 1 Mobility  Mobility Specialist Start Time (ACUTE ONLY) 1320  Mobility Specialist Stop Time (ACUTE ONLY) 1335  Mobility Specialist Time Calculation (min) (ACUTE ONLY) 15 min   Received pt in bed and agreeable to mobility. Pt required MinG for safety. No c/o. Pt left EOB with alarm on. Personal belongings and call light within reach. All needs met. NT aware.  Lavanda Pollack Mobility Specialist  Please contact via [employer] or  Rehab Office [phone]

## 2024-04-05 NOTE — TOC Progression Note (Addendum)
 Transition of Care [facility]) - Progression Note    Patient Details  Name: Lauren Craig MRN: [id number] Date of Birth: 07-03-1945  Transition of [facility]) CM/SW Contact  [name], LCSWA Phone Number: 04/05/2024, 11:44 AM  Clinical Narrative:     11:44 AM CSW spoke with patient's daughter, Samule, who consented CSW to email ([email or web address]) patient's current SNF options ([address], [place], [place], [place], [place], [place], [facility]) with their quarterly Medicare ratings. CSW will continue to follow.  12:06 PM [name] informed CSW of SNF decision [facility]). CSW informed [facility] SNF of bed acceptance who stated they would have bed ready for patient Sunday. CSW submitted SNF and ambulance transport insurance authorization requests which are currently pending. CSW made medical team aware.  Expected Discharge Plan: Home w Home Health Services Barriers to Discharge: Continued Medical Work up               Expected Discharge Plan and Services       Living arrangements for the past 2 months: Single Family Home                                       Social Drivers of Health (SDOH) Interventions SDOH Screenings   Food Insecurity: No Food Insecurity (04/03/2024)  Recent Concern: Food Insecurity - Food Insecurity Present (03/08/2024)  Housing: Low Risk  (04/03/2024)  Transportation Needs: No Transportation Needs (04/03/2024)  Utilities: Not At Risk (04/03/2024)  Alcohol Screen: Low Risk  (05/29/2023)  Depression (PHQ2-9): Low Risk  (03/14/2024)  Financial Resource Strain: Low Risk  (05/29/2023)  Physical Activity: Inactive (05/29/2023)  Social Connections: Moderately Isolated (04/03/2024)  Stress: Stress Concern Present (02/15/2021)  Tobacco Use: Medium Risk (04/04/2024)  Health Literacy: Adequate Health Literacy (05/29/2023)    Readmission Risk Interventions    12/05/2023   12:21 PM  Readmission Risk Prevention Plan   Transportation Screening Complete  PCP or Specialist Appt within 5-7 Days Complete  Home Care Screening Complete  Medication Review (RN CM) Referral to Pharmacy

## 2024-04-05 NOTE — Progress Notes (Signed)
 Initial Nutrition Assessment  DOCUMENTATION CODES:   Not applicable  INTERVENTION:  Multivitamin w/ minerals daily Continue diet per SLP recommendations Meal ordering with assist Encourage good PO intake  Magic cup TID with meals, each supplement provides 290 kcal and 9 grams of protein  NUTRITION DIAGNOSIS:  Increased nutrient needs related to acute illness as evidenced by estimated needs.  GOAL:  Patient will meet greater than or equal to 90% of their needs  MONITOR:  PO intake, Supplement acceptance, Weight trends  REASON FOR ASSESSMENT:  Consult Assessment of nutrition requirement/status  ASSESSMENT:  78 y.o. female presented to the ED with nausea,vomiting, and known UTI. PMH includes HTN, ICH, CKD, PVD, CVA,  dysphagia, and T2DM. Pt admitted with UTI and N/V, unable to keep antibiotics down.  11/05 - Admitted 11/06 - SLP eval, diet downgraded to dysphagia 3 11/07 - MBS  RD working remotely at time of assessment. RD unable to reach pt via phone in room. Discussed with RN, pt just received her breakfast and was about to eat. No additional meal intakes have been recorded. Per chart review, pt had RD visit at a previous admission and declined Ensure at that visit. RD to order an alternative oral nutrition supplement to help with pt PO intake.   Per chart review, pt weight has fluctuated between 66.8-73.8 kg over the past year and varies each encounter. Pt with noted weight loss of 4.1 kg or 5.8% weight loss since 03/18/24. Although, weight recorded three days prior to that was 3 kg lower and unsure accuracy of weight.   Nutrition Related Medications: Pepcid  Labs: Sodium 134, Potassium 4.3, BUN 44, Creatinine 2.20  CBG: 101-142 mg/dL x 24 hrs    NUTRITION - FOCUSED PHYSICAL EXAM:  Deferred to follow-up.  Diet Order:   Diet Order             DIET DYS 3 Room service appropriate? Yes; Fluid consistency: Thin  Diet effective now                  EDUCATION NEEDS:   No education needs have been identified at this time  Skin:  Skin Assessment: Reviewed RN Assessment  Last BM:  11/5  Height:  Ht Readings from Last 1 Encounters:  04/03/24 5' 5 (1.651 m)   Weight:  Wt Readings from Last 1 Encounters:  04/03/24 66.8 kg   Ideal Body Weight:  56.8 kg  BMI:  Body mass index is 24.51 kg/m.  Estimated Nutritional Needs:  Kcal:  1700-1900 Protein:  85-105 grams Fluid:  >/= 1.7 L   Nestora Glatter RD, LDN Clinical Dietitian

## 2024-04-05 NOTE — Progress Notes (Addendum)
     Daily Progress Note Intern Pager: [phone]  Patient name: Lauren Craig Medical record number: [id number] Date of birth: 1946/05/08 Age: 78 y.o. Gender: female  Primary Care Provider: Delores Suzann HERO, MD Consultants: None Code Status: Full Code  Pt Overview and Major Events to Date:  11/5 : Admitted  [name] is a 78 y.o. female presenting with nausea and vomiting in the setting of known UTI.   Assessment and Plan: Assessment & Plan UTI (urinary tract infection) Normal WBC. Patient was recently prescribed Keflex (11/3 - 11/5 ) and Doxycycline (10/18-11/1) for outpatient treatment of UTI, - Starting PO Nitrofurantoin 100 mg BID for total of 5 days ( 11/7-11/11) and stop IV ABX - Urine culture pending - Pain : Tylenol  650 mg PO Q6H PRN Nausea & vomiting Improved. Denies N/V since admission. Able to tolerated diet. - PRN IV Zofran , Compazine  - Continue home Pepcid  daily - Daily AM BMP Dysphagia Per patient's daughter, patient with chronic intermittent dysphagia, dysarthria, and weakness since CVA in 2023.  - SLP consulted for speech evaluation : recommending D3 diet w/ thin liquid  Chronic health problem Asthma : Hold home ventolin  HFA, continue PROVENTIL  neb Q6H PRN HTN : Continue home NORVASC  10 mg Daily, Continue cloNIDINE  0.1 tab BID, continue home HydraALAZINE 50 mg BID, and  DMII : BCG x4 CKD : Continue ASA 81 mg Daily Hold Lasix  20 mg BID CVA - left MCA stenosis  PVD Mood disorder : Continue LEXAPRO  20 mg Daily HLD : Continue home CRESTOR  10 mg daily, ZETIA  10 mg Daily  FEN/GI: Dysphagia diet w/ thin diet PPx: Lovenox  Dispo:SNF pending bed   Subjective:  Rest comfortable in bed. Slightly elevated BP OVN. Denies HA, vision change, or N/V  Objective: Temp:  [97.4 F (36.3 C)-97.7 F (36.5 C)] 97.4 F (36.3 C) (11/07 0741) Pulse Rate:  [48-62] 48 (11/07 0741) Resp:  [14-18] 16 (11/07 0741) BP: (134-189)/(55-60) 157/55 (11/07 0741) SpO2:  [97 %-100 %] 97  % (11/07 0741)  Physical Exam: General: No distress, Non toxic, appear as age Cardiovascular: S1S2 RRR Respiratory: No distress on RA Abdomen: Soft, no tenderness on palpation, last BM 04/03/24  Laboratory: Most recent CBC Lab Results  Component Value Date   WBC 8.7 04/03/2024   HGB 13.7 04/03/2024   HCT 45.1 04/03/2024   MCV 83.4 04/03/2024   PLT 355 04/03/2024   Most recent BMP    Latest Ref Rng & Units 04/05/2024    5:22 AM  BMP  Glucose 70 - 99 mg/dL 879   BUN 8 - 23 mg/dL 44   Creatinine 9.55 - 1.00 mg/dL 7.79   Sodium 864 - 854 mmol/L 134   Potassium 3.5 - 5.1 mmol/L 4.3   Chloride 98 - 111 mmol/L 102   CO2 22 - 32 mmol/L 23   Calcium  8.9 - 10.3 mg/dL 7.8      Imaging/Diagnostic Tests: None  Suzen Houston NOVAK, DO 04/05/2024, 9:06 AM  PGY-1, [facility] FPTS Intern pager: [phone], text pages welcome Secure chat group [facility] Teaching Service

## 2024-04-05 NOTE — Assessment & Plan Note (Addendum)
 Remains afebrile with improved white count.  Failed outpatient antibiotics due to nausea and vomiting. - Continue PO Nitrofurantoin 100 mg BID for total of 5 days ( 11/7-11/11) - Urine culture with no growth to date - Pain : Tylenol  650 mg PO Q6H PRN

## 2024-04-05 NOTE — Assessment & Plan Note (Signed)
 Improved. Denies N/V since admission. Able to tolerated diet. - PRN IV Zofran , Compazine  - Continue home Pepcid  daily - Daily AM BMP

## 2024-04-05 NOTE — Plan of Care (Signed)

## 2024-04-05 NOTE — Assessment & Plan Note (Addendum)
 Normal WBC. Patient was recently prescribed Keflex (11/3 - 11/5 ) and Doxycycline (10/18-11/1) for outpatient treatment of UTI, - Starting PO Nitrofurantoin 100 mg BID for total of 5 days ( 11/7-11/11) and stop IV ABX - Urine culture pending - Pain : Tylenol  650 mg PO Q6H PRN

## 2024-04-05 NOTE — Assessment & Plan Note (Addendum)
 Improved. Denies N/V since admission. Able to [employer]. - Stop IV as needed Compazine  - Can order Zofran  ODT if needed - Continue home Pepcid  daily - Daily AM BMP

## 2024-04-05 NOTE — Assessment & Plan Note (Signed)
 Per patient's daughter, patient with chronic intermittent dysphagia, dysarthria, and weakness since CVA in 2023.  - SLP consulted for speech evaluation : recommending D3 diet w/ thin liquid

## 2024-04-05 NOTE — Assessment & Plan Note (Signed)
 Asthma : Hold home ventolin  HFA, continue PROVENTIL  neb Q6H PRN HTN : Continue home NORVASC  10 mg Daily, Continue cloNIDINE  0.1 tab BID, continue home HydraALAZINE 50 mg BID, and  DMII : BCG x4 CKD : Continue ASA 81 mg Daily Hold Lasix  20 mg BID CVA - left MCA stenosis  PVD Mood disorder : Continue LEXAPRO  20 mg Daily HLD : Continue home CRESTOR  10 mg daily, ZETIA  10 mg Daily

## 2024-04-05 NOTE — Consult Note (Signed)
 Please note that the [facility] nursing team is utilizing a standardized work plan to manage patient consults. We are triaging consults and will try to see the patients within 48 hours. Wound photos in the patient's chart allow us  to consult on the patient in the most efficient and timely manner.    Daimian Sudberry [facility], CNS, CWON-AP [phone]

## 2024-04-05 NOTE — Telephone Encounter (Signed)
 Called daughter and answered concerns. All questions answered.  Suzann Daring, MD  Family Medicine Teaching Service

## 2024-04-05 NOTE — Consult Note (Signed)
 WOC Nurse Consult Note: Reason for Consult: Chronic BLE venous stasis  No history of vascular studies that I can locate, for that reason I will be conservative with tx.  Wound type: full thickness wound right medial lower leg; scabbed areas on the LLE  Pressure Injury POA: NA Measurement: see nursing flow sheets Wound bed: scabbed; 100% black right medial LE wound Drainage (amount, consistency, odor) see nursing flow sheets Periwound: intact; history of LE edema  Dressing procedure/placement/frequency: Cleanse LE wounds with saline, pat dry Apply Eucerin to the bilateral LEs, not between the toes.  Apply single layer of xeroform to the RLE wound, leave LLE wounds open to air.  Ok to wrapRLE with kerlix or top with foam.  Change every other day    Re consult if needed, will not follow at this time. Thanks  Alexica [employer], RN,CWOCN, CNS, [employer] [phone]

## 2024-04-05 NOTE — Assessment & Plan Note (Addendum)
 Asthma : Hold home ventolin  HFA, continue PROVENTIL  neb Q6H PRN HTN : Continue home NORVASC  10 mg Daily, Continue cloNIDINE  0.1 tab BID, continue home HydraALAZINE 50 mg BID, and  DMII : BCG x4 CKD : Continue ASA 81 mg Daily Hold Lasix  20 mg BID CVA - left MCA stenosis  PVD Mood disorder : Continue LEXAPRO  20 mg Daily HLD : Continue home CRESTOR  10 mg daily, ZETIA  10 mg Daily

## 2024-04-06 DIAGNOSIS — I10 Essential (primary) hypertension: Secondary | ICD-10-CM | POA: Diagnosis not present

## 2024-04-06 DIAGNOSIS — N3000 Acute cystitis without hematuria: Secondary | ICD-10-CM | POA: Diagnosis not present

## 2024-04-06 NOTE — Plan of Care (Addendum)
 FMTS Interim Progress Note  Attempted to contact peer to peer with phone number provided by social work team - [phone].  No response and phone number goes to personal voicemail therefore did not leave any identifying information.  Contacted social work team for alternative phone number.  Addendum 5:45PM: Spoke with SW team who confirmed correct phone number and requested to leave a voicemail for call back.  Attempted to call back number without success, left voicemail.  Will reattempt in the morning.  Theophilus Pagan, MD 04/06/2024, 3:57 PM PGY-3, [facility] Family Medicine Service pager [phone]

## 2024-04-06 NOTE — Progress Notes (Signed)
 Mobility Specialist Progress Note:    04/06/24 1256  Mobility  Activity Turned to back - supine (Ankle Pump, Leg Ext, Heel slides)  Level of Assistance Standby assist, set-up cues, supervision of patient - no hands on  Assistive Device None  Range of Motion/Exercises Active Assistive (pt able to perform movements once showing her once or twice what she should do and how she should feel.)  Activity Response Tolerated fair  Mobility Referral Yes  Mobility visit 1 Mobility  Mobility Specialist Start Time (ACUTE ONLY) 1256  Mobility Specialist Stop Time (ACUTE ONLY) 1305  Mobility Specialist Time Calculation (min) (ACUTE ONLY) 9 min   Received pt laying in bed refusing ambulation d/t pain and not feeling well but agreeable to trying bed level exercises. Pt c/o ankle and foot pain. Notified RN. Pt able to perform movements after demonstration. Left pt in bed w/ all needs met.   Venetia Keel Mobility Specialist Please Neurosurgeon or Rehab Office at [phone]

## 2024-04-06 NOTE — Plan of Care (Signed)
   Problem: Education: Goal: Knowledge of General Education information will improve Description Including pain rating scale, medication(s)/side effects and non-pharmacologic comfort measures Outcome: Progressing

## 2024-04-06 NOTE — TOC Progression Note (Addendum)
 Transition of Care [facility]) - Initial/Assessment Note    Patient Details  Name: Lauren Craig MRN: [id number] Date of Birth: 1945-08-02  Transition of [facility]) CM/SW Contact:    Lauren JULIANNA Bennetts, LCSW Phone Number: 04/06/2024, 11:56 AM  Clinical Narrative:                 CSW informed that [employer] (pt's insurance provider) is offering a peer to peer to be completed by noon tomorrow.  Contact number- [phone].   Attendings notified via secure chat.  TOC will continue to follow.  Expected Discharge Plan: Home w Home Health Services Barriers to Discharge: Continued Medical Work up   Patient Goals and CMS Choice            Expected Discharge Plan and Services       Living arrangements for the past 2 months: Single Family Home                                      Prior Living Arrangements/Services Living arrangements for the past 2 months: Single Family Home Lives with:: Adult Children Patient language and need for interpreter reviewed:: Yes        Need for Family Participation in Patient Care: Yes (Comment)   Current home services: DME Criminal Activity/Legal Involvement Pertinent to Current Situation/Hospitalization: No - Comment as needed  Activities of Daily Living   ADL Screening (condition at time of admission) Independently performs ADLs?: Yes (appropriate for developmental age) Is the patient deaf or have difficulty hearing?: No Does the patient have difficulty seeing, even when wearing glasses/contacts?: No Does the patient have difficulty concentrating, remembering, or making decisions?: No  Permission Sought/Granted Permission sought to share information with : Family Supports Permission granted to share information with : No (Contact information on chart)  Share Information with NAME: Lauren Craig     Permission granted to share info w Relationship: Daughter  Permission granted to share info w Contact Information:  [phone]  Emotional Assessment Appearance:: Appears stated age     Orientation: : Oriented to Self, Oriented to Place, Oriented to  Time, Oriented to Situation Alcohol / Substance Use: Not Applicable Psych Involvement: No (comment)  Admission diagnosis:  UTI (urinary tract infection) [N39.0] Patient Active Problem List   Diagnosis Date Noted   Abdominal pain 04/04/2024   UTI (urinary tract infection) 04/03/2024   Dysphagia 04/03/2024   Dysarthria 04/03/2024   History of CVA (cerebrovascular accident) 04/03/2024   Essential hypertension 12/01/2023   Normocytic anemia 12/01/2023   Leg swelling 12/01/2023   Bradycardia 12/01/2023   Chronic health problem 12/01/2023   Hyperkalemia 11/30/2023   CKD (chronic kidney disease), stage IV (HCC) 11/30/2023   Goals of care, counseling/discussion 12/16/2022   Mood disorder 11/09/2022   ICH (intracerebral hemorrhage) (HCC) 05/15/2022   Senile purpura 02/23/2022   Memory changes 02/23/2022   CKD (chronic kidney disease) 08/27/2021   Arthritis 03/15/2021   Esophagitis 03/15/2021   Renal cyst 12/17/2020   Visual field defect 11/08/2020   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 [place] [address] Phone: [phone] Fax: [phone]  [id number] [place], [place] - [address] AT [address] [place] [address] Phone: [phone] Fax: [phone]  [facility] [address] [place] [address] Phone: [phone] Fax: [phone]  [id number] [place], [place] - [address] [place] [address] Phone: [phone] Fax: [phone]     Social Drivers of Health (SDOH) Social History: SDOH Screenings   Food Insecurity: No Food Insecurity (04/03/2024)  Recent Concern: Food Insecurity - Food Insecurity Present (03/08/2024)  Housing: Low Risk  (04/03/2024)  Transportation Needs: No Transportation Needs (04/03/2024)  Utilities: Not At Risk (04/03/2024)  Alcohol Screen: Low Risk  (05/29/2023)  Depression (PHQ2-9): Low Risk  (03/14/2024)  Financial Resource Strain: Low Risk  (05/29/2023)  Physical Activity: Inactive (05/29/2023)  Social Connections: Moderately Isolated (04/03/2024)  Stress: Stress Concern Present (02/15/2021)  Tobacco Use: Medium Risk (04/04/2024)  Health Literacy: Adequate Health Literacy (05/29/2023)   SDOH Interventions:     Readmission Risk Interventions    12/05/2023   12:21 PM  Readmission Risk Prevention Plan  Transportation Screening Complete  PCP or Specialist Appt within 5-7 Days Complete  Home Care Screening Complete  Medication Review (RN CM) Referral to Pharmacy

## 2024-04-07 ENCOUNTER — Ambulatory Visit: Attending: Family Medicine

## 2024-04-07 DIAGNOSIS — R2681 Unsteadiness on feet: Secondary | ICD-10-CM | POA: Diagnosis not present

## 2024-04-07 DIAGNOSIS — I63512 Cerebral infarction due to unspecified occlusion or stenosis of left middle cerebral artery: Secondary | ICD-10-CM | POA: Diagnosis not present

## 2024-04-07 DIAGNOSIS — Z8673 Personal history of transient ischemic attack (TIA), and cerebral infarction without residual deficits: Secondary | ICD-10-CM | POA: Diagnosis not present

## 2024-04-07 DIAGNOSIS — R41841 Cognitive communication deficit: Secondary | ICD-10-CM | POA: Diagnosis not present

## 2024-04-07 DIAGNOSIS — M1990 Unspecified osteoarthritis, unspecified site: Secondary | ICD-10-CM | POA: Diagnosis not present

## 2024-04-07 DIAGNOSIS — D649 Anemia, unspecified: Secondary | ICD-10-CM | POA: Diagnosis not present

## 2024-04-07 DIAGNOSIS — F32A Depression, unspecified: Secondary | ICD-10-CM | POA: Diagnosis not present

## 2024-04-07 DIAGNOSIS — E119 Type 2 diabetes mellitus without complications: Secondary | ICD-10-CM | POA: Diagnosis not present

## 2024-04-07 DIAGNOSIS — F39 Unspecified mood [affective] disorder: Secondary | ICD-10-CM | POA: Diagnosis not present

## 2024-04-07 DIAGNOSIS — I739 Peripheral vascular disease, unspecified: Secondary | ICD-10-CM | POA: Diagnosis not present

## 2024-04-07 DIAGNOSIS — E785 Hyperlipidemia, unspecified: Secondary | ICD-10-CM | POA: Diagnosis not present

## 2024-04-07 DIAGNOSIS — E538 Deficiency of other specified B group vitamins: Secondary | ICD-10-CM | POA: Diagnosis not present

## 2024-04-07 DIAGNOSIS — N3000 Acute cystitis without hematuria: Secondary | ICD-10-CM | POA: Diagnosis not present

## 2024-04-07 DIAGNOSIS — N184 Chronic kidney disease, stage 4 (severe): Secondary | ICD-10-CM | POA: Diagnosis not present

## 2024-04-07 DIAGNOSIS — I10 Essential (primary) hypertension: Secondary | ICD-10-CM | POA: Diagnosis not present

## 2024-04-07 DIAGNOSIS — M6281 Muscle weakness (generalized): Secondary | ICD-10-CM | POA: Diagnosis not present

## 2024-04-07 DIAGNOSIS — R1311 Dysphagia, oral phase: Secondary | ICD-10-CM | POA: Diagnosis not present

## 2024-04-07 MED ORDER — NITROFURANTOIN MACROCRYSTAL 100 MG PO CAPS: 100.0000 mg | ORAL_CAPSULE | Freq: Once | ORAL | Status: AC

## 2024-04-07 NOTE — Discharge Instructions (Addendum)
 Dear Lauren Craig,   Thank you so much for allowing us  to be part of your care!  You were admitted to [facility] for UTI and you received antibiotics to treat this infection. Please finish the antibiotic as prescribed. You are going to a skilled nursing facility to continue building your strength and wellness.    POST-HOSPITAL & CARE INSTRUCTIONS You are scheduled to follow up with your PCP clinic on 04/12/24 at 2:10 PM.  Please let PCP/Specialists know of any changes that were made.  Please see medications section of this packet for any medication changes.   DOCTOR'S APPOINTMENT & FOLLOW UP CARE INSTRUCTIONS  Future Appointments  Date Time Provider Department Center  04/12/2024  1:30 PM Veva Bolt, LCSW [facility] None  04/12/2024  2:10 PM Elicia Hamlet, MD [facility]  04/15/2024 11:10 AM [facility] ANNUAL WELLNESS VISIT [facility]  04/15/2024  1:00 PM Delene Thersia PARAS [facility] None    Take care and be well!  Family Medicine Teaching Service  [place]  [facility]  [address] [place], [place] [address] [phone]

## 2024-04-07 NOTE — Progress Notes (Signed)
 Physical Therapy Treatment Patient Details Name: Lauren Craig MRN: [id number] DOB: [birth year] Today's Date: 04/07/2024   History of Present Illness Pt is a 78 y.o. female who presented due to n/v and UTI.  PMH: of HTN, Esophagitis, HLD, PVD, CVA, DM2, L 5th toe amp, and B12 deficiency    PT Comments  Continuing work on functional mobility and activity tolerance;  Session focused on functional transfers and continued work on gait and endurance; Lauren Craig participated well; Noting much improved sit to stand transfers with her shoes on; she has good rehab potential; Overall progressing slowly, but participating fully; Anticipate continuing good progress at post-acute rehabilitation.     If plan is discharge home, recommend the following: A little help with walking and/or transfers;A little help with bathing/dressing/bathroom;Assistance with cooking/housework;Direct supervision/assist for medications management;Direct supervision/assist for financial management;Assist for transportation;Help with stairs or ramp for entrance;Supervision due to cognitive status   Can travel by private vehicle     Yes  Equipment Recommendations  None recommended by PT    Recommendations for Other Services       Precautions / Restrictions Precautions Precautions: Fall Recall of Precautions/Restrictions: Intact Restrictions Weight Bearing Restrictions Per Provider Order: No     Mobility  Bed Mobility Overal bed mobility: Needs Assistance Bed Mobility: Supine to Sit     Supine to sit: Contact guard, Min assist     General bed mobility comments: CGA for safety; Min assist once up to stabilize in sitting as pt scooted hips to EOB    Transfers Overall transfer level: Needs assistance Equipment used: Rollator (4 wheels) Transfers: Sit to/from Stand Sit to Stand: Min assist, Contact guard assist           General transfer comment: Initial sit to stand with min assist, and sat back down nearly  immediately after standing; did not specifically state why she sat back down, but this PT noted her shoes under the bed, and offered for pt to put them on;  and she stood (with shoes on) to rollator with CGA -- noting also that she checked the brakes without need for cueing    Ambulation/Gait Ambulation/Gait assistance: Contact guard assist, Min assist Gait Distance (Feet): 8 Feet Assistive device: Rollator (4 wheels) Gait Pattern/deviations: Step-through pattern, Decreased stride length       General Gait Details: pt with slow gait, original goal was to get to the sink, limited to within room due to fatigue, and she chose to turn to get straight to the recliner   Stairs             Wheelchair Mobility     Tilt Bed    Modified Rankin (Stroke Patients Only)       Balance     Sitting balance-Leahy Scale: Good       Standing balance-Leahy Scale: Fair Standing balance comment: reliant on device                            Communication Communication Communication: Impaired Factors Affecting Communication: Reduced clarity of speech;Other (comment) (and often incr time to answer questions)  Cognition Arousal: Alert Behavior During Therapy: WFL for tasks assessed/performed   PT - Cognitive impairments: No family/caregiver present to determine baseline, Orientation, Awareness, Problem solving, Safety/Judgement                       PT - Cognition Comments: pt needing increased time to follow some  cues but generally WFL in session, not formally assessed Following commands: Intact      Cueing Cueing Techniques: Verbal cues  Exercises      General Comments General comments (skin integrity, edema, etc.): NAD on RA; pt incontinent of urine upon arrival and this PT assisted with washing her back and bottom in standing -- likely incr time in standing added to her fatigue; noted also pills in her bed and pills on her tray; saved tehm and shwoed them to  her bedside nurse      Pertinent Vitals/Pain Pain Assessment Pain Assessment: No/denies pain Pain Intervention(s): Monitored during session    Home Living                          Prior Function            PT Goals (current goals can now be found in the care plan section) Acute Rehab PT Goals Patient Stated Goal: to return home PT Goal Formulation: With patient Time For Goal Achievement: 04/18/24 Potential to Achieve Goals: Good Progress towards PT goals: Progressing toward goals    Frequency    Min 2X/week      PT Plan      Co-evaluation              AM-PAC PT 6 Clicks Mobility   Outcome Measure  Help needed turning from your back to your side while in a flat bed without using bedrails?: A Little Help needed moving from lying on your back to sitting on the side of a flat bed without using bedrails?: A Little Help needed moving to and from a bed to a chair (including a wheelchair)?: A Little Help needed standing up from a chair using your arms (e.g., wheelchair or bedside chair)?: A Little Help needed to walk in hospital room?: A Lot Help needed climbing 3-5 steps with a railing? : A Lot 6 Click Score: 16    End of Session Equipment Utilized During Treatment: Gait belt Activity Tolerance: Patient tolerated treatment well Patient left: in chair;with call bell/phone within reach;with chair alarm set Nurse Communication: Mobility status PT Visit Diagnosis: Unsteadiness on feet (R26.81);Other abnormalities of gait and mobility (R26.89);Muscle weakness (generalized) (M62.81)     Time: 9056-8984 PT Time Calculation (min) (ACUTE ONLY): 32 min  Charges:    $Gait Training: 8-22 mins $Therapeutic Activity: 8-22 mins PT General Charges $$ ACUTE PT VISIT: 1 Visit                     Silvano Currier, PT  Acute Rehabilitation Services Office [phone] Secure Chat welcomed    Silvano VEAR Currier 04/07/2024, 10:26 AM

## 2024-04-07 NOTE — Discharge Summary (Addendum)
 Family Medicine Teaching [facility] Discharge Summary  Patient name: Lauren Craig Medical record number: [id number] Date of birth: 1946-02-13 Age: 78 y.o. Gender: female Date of Admission: 04/03/2024  Date of Discharge: 04/07/24  Admitting Physician: Otto ONEIDA Fairly, MD  Primary Care Provider: Delores Suzann HERO, MD Consultants: None  Indication for Hospitalization: Intractable vomiting, UTI  Discharge Diagnoses/Problem List:  Principal Problem for Admission: UTI Other Problems addressed during stay:  Principal Problem:   UTI (urinary tract infection) Active Problems:   Essential hypertension   Dysphagia   Dysarthria   History of CVA (cerebrovascular accident)   Abdominal pain   Brief Hospital Course:  Lauren Craig is a 78 y.o. year old with a history of Asthma, HTN, DMII, CKD, CVA, and HLD who presented with inability to tolerated PO intake in the setting of UTI and was admitted to the [facility] Medicine Teaching Service for UTI.  Her hospital course is outlined below.  UTI (urinary tract infection) Patient with known UTI presenting for continued dysuria and difficulty tolerating outpatient regimen.  Initially presented afebrile without leukocytosis. She was recently prescribed Keflex (11/3-11/5 ) and doxycycline  (10/18-11/1) for outpatient treatment of UTI, though had difficulty taking oral antibiotics due to nausea and vomiting as below.  Prior urine culture with E. coli.  On admission she was placed on IV Ceftriaxone  and then transitioned to PO Nitrofurantoin for total of 5 days of antibiotics. Urine culture during admission showed no growth, though this was collected after already initiating antibiotics prior.  Nausea & vomiting Improved with antiemetics during admission.  Was tolerating p.o. prior to discharge.  Continued home Pepcid  during hospitalization.    Dysphagia Per patient's daughter, patient with chronic intermittent dysphagia, dysarthria, and weakness since  CVA in 2023. SLP consulted for speech evaluation and initially recommended dysphagia 3 diet.  Patient underwent modified barium swallow, which demonstrated mildly reduced UES but adequate flow of boluses into esophagus, so SLP recommended patient could advanced diet as tolerated.  Hx CVA Patient with limited mobility since CVA in 2023.  PT and OT evaluated patient during admission and recommended SNF.  Patient discharged to SNF.  Other chronic conditions were medically managed with home medications and formulary alternatives as necessary (asthma, HTN, T2DM, CKD, PVD, HLD, depressed mood).  PCP Follow-up Recommendations: Consider referral to outpatient urology given recurrent UTI. Lasix  held on admission and discharge.  Per chart review, patient uses Lasix  daily for lower extremity swelling.  Consider prn Lasix  rather than daily given kidney function Continue to optimize BP regimen as indicated   Results/Tests Pending at Time of Discharge:  Unresulted Labs (From admission, onward)    None        Disposition: SNF  Discharge Condition: Improved and stable  Discharge Exam:  Vitals:   04/06/24 2307 04/07/24 0851  BP: (!) 152/53 (!) 148/49  Pulse:  (!) 46  Resp:  18  Temp:  97.8 F (36.6 C)  SpO2:  98%   General: No acute distress resting comfortably in room. CV: Normal S1/S2. No extra heart sounds. Warm and well-perfused. Pulm: Breathing comfortably on room air. CTAB anteriorly. No increased WOB. Abd: Soft, non-tender, non-distended. Skin:  Warm, dry.   Significant Procedures:  MBS 11/7: Possible mildly reduced UES.  Adequate flow of boluses into the esophagus.  Significant Labs and Imaging:     Latest Ref Rng & Units 04/03/2024    2:00 PM 04/01/2024   11:41 AM 03/15/2024    5:00 PM  CBC  WBC 4.0 -  10.5 K/uL 8.7  13.7  6.5   Hemoglobin 12.0 - 15.0 g/dL 86.2  87.3  9.1   Hematocrit 36.0 - 46.0 % 45.1  40.8  30.0   Platelets 150 - 400 K/uL 355  383  278    Lab Results   Component Value Date   NA 134 (L) 04/05/2024   K 4.3 04/05/2024   CO2 23 04/05/2024   GLUCOSE 120 (H) 04/05/2024   BUN 44 (H) 04/05/2024   CREATININE 2.20 (H) 04/05/2024   CALCIUM  7.8 (L) 04/05/2024   EGFR 27 (L) 03/18/2024   GFRNONAA 22 (L) 04/05/2024   Urinalysis    Component Value Date/Time   COLORURINE YELLOW 04/03/2024 1641   APPEARANCEUR CLOUDY (A) 04/03/2024 1641   LABSPEC 1.013 04/03/2024 1641   PHURINE 5.0 04/03/2024 1641   GLUCOSEU NEGATIVE 04/03/2024 1641   HGBUR SMALL (A) 04/03/2024 1641   BILIRUBINUR NEGATIVE 04/03/2024 1641   KETONESUR NEGATIVE 04/03/2024 1641   PROTEINUR >=300 (A) 04/03/2024 1641   NITRITE NEGATIVE 04/03/2024 1641   LEUKOCYTESUR LARGE (A) 04/03/2024 1641   Urine Cx 11/5: No growth  Discharge Medications:  Allergies as of 04/07/2024       Reactions   Penicillins Hives, Rash, Other (See Comments)   Did it involve swelling of the face/tongue/throat, SOB, or low BP? Yes Did it involve sudden or severe rash/hives, skin peeling, or any reaction on the inside of your mouth or nose? No Did you need to seek medical attention at a hospital or doctor's office? Yes When did it last happen? ? If all above answers are "NO", may proceed with cephalosporin use.   Insulin  Lispro Itching, Other (See Comments)   Site of injection was inflamed, red, painful episode   Insulin  Lispro Prot & Lispro Other (See Comments)   Site of injection was inflamed, red, painful episode Humalog Mix   Semaglutide Nausea And Vomiting   Other Reaction(s): GI Intolerance   Metformin Diarrhea        Medication List     PAUSE taking these medications    furosemide  20 MG tablet Wait to take this until your doctor or other care provider tells you to start again. Commonly known as: LASIX  Take 2 tablets (40 mg total) by mouth 2 (two) times daily. For swelling       STOP taking these medications    cephALEXin 500 MG capsule Commonly known as: KEFLEX   doxycycline   100 MG capsule Commonly known as: VIBRAMYCIN        TAKE these medications    acetaminophen  325 MG tablet Commonly known as: TYLENOL  Take 2 tablets (650 mg total) by mouth every 6 (six) hours as needed for headache.   albuterol  108 (90 Base) MCG/ACT inhaler Commonly known as: VENTOLIN  HFA Inhale 2 puffs into the lungs every 6 (six) hours as needed for wheezing or shortness of breath.   amLODipine  10 MG tablet Commonly known as: NORVASC  Take 1 tablet (10 mg total) by mouth daily.   aspirin  EC 81 MG tablet Take 1 tablet (81 mg total) by mouth daily. Swallow whole.   cloNIDine  0.1 MG tablet Commonly known as: CATAPRES  Take 1 tablet (0.1 mg total) by mouth 2 (two) times daily.   escitalopram  20 MG tablet Commonly known as: Lexapro  Take 1 tablet (20 mg total) by mouth daily.   ezetimibe  10 MG tablet Commonly known as: Zetia  Take 1 tablet (10 mg total) by mouth daily.   famotidine  10 MG tablet Commonly known as: PEPCID   Take 1 tablet (10 mg total) by mouth daily.   fexofenadine  60 MG tablet Commonly known as: Allegra  Allergy Take 1 tablet (60 mg total) by mouth 2 (two) times daily. What changed:  when to take this reasons to take this   fluticasone  50 MCG/ACT nasal spray Commonly known as: FLONASE  SPRAY 2 SPRAYS INTO EACH NOSTRIL EVERY DAY What changed: See the new instructions.   Gerhardt's butt cream Crea Use twice daily in skin folds   hydrALAZINE  50 MG tablet Commonly known as: APRESOLINE  Take 1 tablet by mouth 2 (two) times daily.   hydrocortisone  2.5 % ointment Apply topically 2 (two) times daily. On red areas on legs   nitrofurantoin 100 MG capsule Commonly known as: MACRODANTIN Take 1 capsule (100 mg total) by mouth once for 1 dose. Take last dose on 04/07/24 evening.   ondansetron  4 MG disintegrating tablet Commonly known as: ZOFRAN -ODT Take 1 tablet (4 mg total) by mouth every 8 (eight) hours as needed for nausea or vomiting.   rosuvastatin  10 MG  tablet Commonly known as: CRESTOR  Take 1 tablet (10 mg total) by mouth daily.   triamcinolone  0.025 % ointment Commonly known as: KENALOG  Apply 1 Application topically 2 (two) times daily. Apply to right ankle rash        Discharge Instructions: Please refer to Patient Instructions section of EMR for full details.  Patient was counseled important signs and symptoms that should prompt return to medical care, changes in medications, dietary instructions, activity restrictions, and follow up appointments.   Follow-Up Appointments:  Contact information for after-discharge care     Destination     [place] [place], [place] .   Service: Skilled Nursing Contact information: [address] [place]  [address] [phone]                    Future Appointments  Date Time Provider Department Center  04/12/2024  1:30 PM Veva Bolt, [place] [facility] None  04/12/2024  2:10 PM Elicia Hamlet, MD [facility]  04/15/2024 11:10 AM [facility] ANNUAL FERDIE VISIT [facility]  04/15/2024  1:00 PM Delene Thersia PARAS [facility] None     Diona Perkins, MD 04/07/2024, 12:53 PM PGY-2, [facility] Family Medicine

## 2024-04-07 NOTE — TOC Progression Note (Addendum)
 Transition of Care [facility]) - Initial/Assessment Note    Patient Details  Name: Lauren Craig MRN: [id number] Date of Birth: 04/26/1946  [facility]) CM/SW Contact:    Lauren JULIANNA Bennetts, LCSW Phone Number: 04/07/2024, 8:34 AM  Clinical Narrative:                 CSW contacted [employer] and spoke with Lauren Craig after being informed again of numerous attempts by providers to complete the peer to peer without success.  (CSW also spoke with [facility] yesterday, 04/06/2024, at 4:30 pm as well expressing the same concern.)    Lauren Craig verified that the number provided ([phone]) is the correct number to call.  Lauren Craig states that she will contact her director and inform of concerns.  Attendings informed.  ADDENDUM: 09:00- CSW received a returned call from [place] with [employer] and was informed to contact Dr. Janit @ [phone] to complete the peer to peer. Attending updated via secure chat.  TOC will continue to follow.   Expected Discharge Plan: Home w Home Health Services Barriers to Discharge: Continued Medical Work up   Patient Goals and CMS Choice            Expected Discharge Plan and Services       Living arrangements for the past 2 months: Single Family Home                                      Prior Living Arrangements/Services Living arrangements for the past 2 months: Single Family Home Lives with:: Adult Children Patient language and need for interpreter reviewed:: Yes        Need for Family Participation in Patient Care: Yes (Comment)   Current home services: DME Criminal Activity/Legal Involvement Pertinent to Current Situation/Hospitalization: No - Comment as needed  Activities of Daily Living   ADL Screening (condition at time of admission) Independently performs ADLs?: Yes (appropriate for developmental age) Is the patient deaf or have difficulty hearing?: No Does the patient have difficulty seeing, even when wearing  glasses/contacts?: No Does the patient have difficulty concentrating, remembering, or making decisions?: No  Permission Sought/Granted Permission sought to share information with : Family Supports Permission granted to share information with : No (Contact information on chart)  Share Information with NAME: Lauren Craig     Permission granted to share info w Relationship: Daughter  Permission granted to share info w Contact Information: [phone]  Emotional Assessment Appearance:: Appears stated age     Orientation: : Oriented to Self, Oriented to Place, Oriented to  Time, Oriented to Situation Alcohol / Substance Use: Not Applicable Psych Involvement: No (comment)  Admission diagnosis:  UTI (urinary tract infection) [N39.0] Patient Active Problem List   Diagnosis Date Noted   Abdominal pain 04/04/2024   UTI (urinary tract infection) 04/03/2024   Dysphagia 04/03/2024   Dysarthria 04/03/2024   History of CVA (cerebrovascular accident) 04/03/2024   Essential hypertension 12/01/2023   Normocytic anemia 12/01/2023   Leg swelling 12/01/2023   Bradycardia 12/01/2023   Chronic health problem 12/01/2023   Hyperkalemia 11/30/2023   CKD (chronic kidney disease), stage IV (HCC) 11/30/2023   Goals of care, counseling/discussion 12/16/2022   Mood disorder 11/09/2022   ICH (intracerebral hemorrhage) (HCC) 05/15/2022   Senile purpura 02/23/2022   Memory changes 02/23/2022   CKD (chronic kidney disease) 08/27/2021   Arthritis 03/15/2021   Esophagitis 03/15/2021   Renal cyst  12/17/2020   Visual field defect 11/08/2020   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 [place] [address] Phone: [phone] Fax: [phone]  [id number] [place], [place] - [address] AT [address] [place] [address] Phone: [phone] Fax: [phone]  [facility]ren [facility] [address] [place] [address] Phone: [phone] Fax: [phone]  [id number] [place], [place] - [address] [place] [address] Phone: [phone] Fax: [phone]     Social Drivers of Health (SDOH) Social History: SDOH Screenings   Food Insecurity: No Food Insecurity (04/03/2024)  Recent Concern: Food Insecurity - Food Insecurity Present (03/08/2024)  Housing: Low Risk  (04/03/2024)  Transportation Needs: No Transportation Needs (04/03/2024)  Utilities: Not At Risk (04/03/2024)  Alcohol Screen: Low Risk  (05/29/2023)  Depression (PHQ2-9): Low Risk  (03/14/2024)  Financial Resource Strain: Low Risk  (05/29/2023)  Physical Activity: Inactive (05/29/2023)  Social Connections: Moderately Isolated (04/03/2024)  Stress: Stress Concern Present (02/15/2021)  Tobacco Use: Medium Risk (04/04/2024)  Health Literacy: Adequate Health Literacy (05/29/2023)   SDOH Interventions:     Readmission Risk Interventions    12/05/2023   12:21 PM  Readmission Risk Prevention Plan  Transportation Screening Complete  PCP or Specialist Appt within 5-7 Days Complete  Home Care Screening Complete  Medication Review (RN CM) Referral to Pharmacy

## 2024-04-07 NOTE — Plan of Care (Signed)

## 2024-04-07 NOTE — Assessment & Plan Note (Deleted)
 Per patient's daughter, patient with chronic intermittent dysphagia, dysarthria, and weakness since CVA in 2023.  - SLP consulted for speech evaluation : recommending D3 diet w/ thin liquid

## 2024-04-07 NOTE — TOC Transition Note (Signed)
 Transition of Care [facility]) - Discharge Note   Patient Details  Name: Lauren Craig MRN: [id number] Date of Birth: 1946-04-21  [facility]) CM/SW Contact:  Britt JULIANNA Bennetts, LCSW Phone Number: 04/07/2024, 1:18 PM   Clinical Narrative:    Patient will DC to: [facility] Anticipated DC date: 04/07/2024 Transport by: [employer] authorization for SNF approved.  Authorization number- [id number].  Ambulance transportation denied.  Per MD patient ready for DC to SNF. RN to call report prior to discharge [phone] room 216). RN, patient, patient's family, and facility notified of DC. Discharge Summary sent to facility. Patient's daughter, Samule, will transport.  CSW will sign off for now as social work intervention is no longer needed. Please consult us  again if new needs arise.    Final next level of care: Skilled Nursing Facility Barriers to Discharge: Barriers Resolved   Patient Goals and CMS Choice            Discharge Placement              Patient [facility] bed at: [facility] Patient to be transferred to facility by: Daughter, [name] Name of family member notified: Daughter Samule Patient and family notified of of transfer: 04/07/24  Discharge Plan and Services Additional resources added to the After Visit Summary for                                       Social Drivers of Health (SDOH) Interventions SDOH Screenings   Food Insecurity: No Food Insecurity (04/03/2024)  Recent Concern: Food Insecurity - Food Insecurity Present (03/08/2024)  Housing: Low Risk  (04/03/2024)  Transportation Needs: No Transportation Needs (04/03/2024)  Utilities: Not At Risk (04/03/2024)  Alcohol Screen: Low Risk  (05/29/2023)  Depression (PHQ2-9): Low Risk  (03/14/2024)  Financial Resource Strain: Low Risk  (05/29/2023)  Physical Activity: Inactive (05/29/2023)  Social Connections: Moderately Isolated (04/03/2024)  Stress: Stress Concern  Present (02/15/2021)  Tobacco Use: Medium Risk (04/04/2024)  Health Literacy: Adequate Health Literacy (05/29/2023)     Readmission Risk Interventions    12/05/2023   12:21 PM  Readmission Risk Prevention Plan  Transportation Screening Complete  PCP or Specialist Appt within 5-7 Days Complete  Home Care Screening Complete  Medication Review (RN CM) Referral to Pharmacy

## 2024-04-07 NOTE — Assessment & Plan Note (Deleted)
 Remains afebrile with improved white count.  Failed outpatient antibiotics due to nausea and vomiting. - Continue PO Nitrofurantoin 100 mg BID for total of 5 days ( 11/7-11/11) - Urine culture with no growth to date - Pain : Tylenol  650 mg PO Q6H PRN

## 2024-04-07 NOTE — Assessment & Plan Note (Deleted)
 Asthma : Hold home ventolin  HFA, continue PROVENTIL  neb Q6H PRN HTN : Continue home NORVASC  10 mg Daily, Continue cloNIDINE  0.1 tab BID, continue home HydraALAZINE 50 mg BID, and  DMII : BCG x4 CKD : Continue ASA 81 mg Daily Hold Lasix  20 mg BID CVA - left MCA stenosis  PVD Mood disorder : Continue LEXAPRO  20 mg Daily HLD : Continue home CRESTOR  10 mg daily, ZETIA  10 mg Daily

## 2024-04-07 NOTE — Plan of Care (Signed)
 Attempted to call contact number for [employer] at number provided by social work team (contact number [phone]), no response, reached voicemail, left general message with callback number.

## 2024-04-10 ENCOUNTER — Telehealth: Payer: Self-pay | Admitting: Licensed Clinical Social Worker

## 2024-04-10 NOTE — Patient Outreach (Signed)
 Patients VBCI enrollment has been paused at this time. Patient has been admitted to [facility] rehab for SNF, following a hospital admission. Patients daughter is also applying for Medicaid with hopes of finding her a long term care bed.   Cena Ligas, LCSW Clinical Social Worker VBCI Population Health

## 2024-04-12 ENCOUNTER — Inpatient Hospital Stay: Payer: Self-pay | Admitting: Family Medicine

## 2024-04-12 ENCOUNTER — Telehealth: Admitting: Licensed Clinical Social Worker

## 2024-04-12 NOTE — Progress Notes (Deleted)
    SUBJECTIVE:   CHIEF COMPLAINT / HPI:   *** BH is a 78 year old F that presents for hospital f/u.  - Recently admitted 11/5 for UTI, treated w/ antibiotics x5days.     Follow-up Recommendations Consider referral to outpatient urology given recurrent UTI. Lasix  held on admission and discharge.  Per chart review, patient uses Lasix  daily for lower extremity swelling.  Consider prn Lasix  rather than daily given kidney function Continue to optimize BP regimen as indicated   PERTINENT  PMH / PSH: ***  OBJECTIVE:   There were no vitals taken for this visit.  ***  ASSESSMENT/PLAN:   Assessment & Plan      Lauren Nearing, MD [facility]

## 2024-04-15 ENCOUNTER — Telehealth: Payer: Self-pay

## 2024-04-15 ENCOUNTER — Ambulatory Visit

## 2024-04-15 NOTE — Patient Outreach (Signed)
 SW cancelling appointment due to patient being inpatient in facility.  Thersia Hoar, HEDWIG, MHA [place]  [facility] Social Worker, Population Health [phone]

## 2024-04-15 NOTE — Telephone Encounter (Signed)
 Advised by Supervisor of AWV Team: This nurse left voicemail to advise patient's daughter, that the AWV scheduled for today was cancelled due to fact that patient was admitted to hospital and discharged to SNF. If patient is released from SNF back to home, then daughter can call [facility] at [phone] to reschedule AWV.   Treasa Bradshaw N. Tomie, LPN [facility] Annual Wellness Team Direct Dial: [phone]

## 2024-04-19 ENCOUNTER — Telehealth: Payer: Self-pay | Admitting: Pharmacist

## 2024-04-19 NOTE — Telephone Encounter (Signed)
Noted.   Josceline Chenard, MD  Family Medicine Teaching Service   

## 2024-04-19 NOTE — Telephone Encounter (Signed)
 Patient's daughter was contacted to clarify dose of cholesterol medication Rosuvastatin  40/10 mg for adherence QI '25.   Daughter reports that patient is currently at [facility] and long term placement in progress.

## 2024-04-29 ENCOUNTER — Other Ambulatory Visit: Payer: Self-pay

## 2024-05-08 ENCOUNTER — Ambulatory Visit: Attending: Family Medicine

## 2024-05-13 ENCOUNTER — Telehealth: Payer: Self-pay | Admitting: Licensed Clinical Social Worker

## 2024-05-13 NOTE — Patient Outreach (Signed)
 LCSW spoke to patients daughter Samule on the phone. Samule stated that patient is in a long term care bed at [facility]. LCSW will close from VBCI program.  Cena Ligas, LCSW Clinical Social Worker [employer]BCI Population Health

## 2024-05-14 ENCOUNTER — Other Ambulatory Visit: Payer: Self-pay

## 2024-05-16 ENCOUNTER — Telehealth: Payer: Self-pay | Admitting: Family Medicine

## 2024-05-16 ENCOUNTER — Other Ambulatory Visit: Payer: Self-pay | Admitting: *Deleted

## 2024-05-16 NOTE — Telephone Encounter (Signed)
 See mychart message under [employer].

## 2024-05-16 NOTE — Telephone Encounter (Signed)
 Cruise for completed. This needs to be signed. Reviewed, completed, and signed form.  Note routed to RN team inbasket and placed completed form in RN Wall pocket in the front office.  Suzann CHRISTELLA Daring, MD

## 2024-05-17 ENCOUNTER — Other Ambulatory Visit: Payer: Self-pay

## 2024-05-17 ENCOUNTER — Emergency Department (HOSPITAL_COMMUNITY)

## 2024-05-17 ENCOUNTER — Encounter (HOSPITAL_COMMUNITY): Payer: Self-pay

## 2024-05-17 ENCOUNTER — Emergency Department (HOSPITAL_COMMUNITY): Admission: EM | Admit: 2024-05-17 | Source: Home / Self Care

## 2024-05-17 DIAGNOSIS — Z8673 Personal history of transient ischemic attack (TIA), and cerebral infarction without residual deficits: Secondary | ICD-10-CM | POA: Insufficient documentation

## 2024-05-17 DIAGNOSIS — Z7951 Long term (current) use of inhaled steroids: Secondary | ICD-10-CM | POA: Diagnosis not present

## 2024-05-17 DIAGNOSIS — Z79899 Other long term (current) drug therapy: Secondary | ICD-10-CM | POA: Diagnosis not present

## 2024-05-17 DIAGNOSIS — J45909 Unspecified asthma, uncomplicated: Secondary | ICD-10-CM | POA: Diagnosis not present

## 2024-05-17 DIAGNOSIS — N189 Chronic kidney disease, unspecified: Secondary | ICD-10-CM | POA: Diagnosis not present

## 2024-05-17 DIAGNOSIS — R1013 Epigastric pain: Secondary | ICD-10-CM | POA: Insufficient documentation

## 2024-05-17 DIAGNOSIS — Z7982 Long term (current) use of aspirin: Secondary | ICD-10-CM | POA: Insufficient documentation

## 2024-05-17 DIAGNOSIS — E1122 Type 2 diabetes mellitus with diabetic chronic kidney disease: Secondary | ICD-10-CM | POA: Insufficient documentation

## 2024-05-17 LAB — COMPREHENSIVE METABOLIC PANEL WITH GFR
ALT: 7 U/L (ref 0–44)
AST: 13 U/L — ABNORMAL LOW (ref 15–41)
Albumin: 3.5 g/dL (ref 3.5–5.0)
Alkaline Phosphatase: 96 U/L (ref 38–126)
Anion gap: 10 (ref 5–15)
BUN: 22 mg/dL (ref 8–23)
CO2: 22 mmol/L (ref 22–32)
Calcium: 9.2 mg/dL (ref 8.9–10.3)
Chloride: 104 mmol/L (ref 98–111)
Creatinine, Ser: 1.96 mg/dL — ABNORMAL HIGH (ref 0.44–1.00)
GFR, Estimated: 25 mL/min — ABNORMAL LOW
Glucose, Bld: 121 mg/dL — ABNORMAL HIGH (ref 70–99)
Potassium: 4.4 mmol/L (ref 3.5–5.1)
Sodium: 136 mmol/L (ref 135–145)
Total Bilirubin: 0.4 mg/dL (ref 0.0–1.2)
Total Protein: 6.8 g/dL (ref 6.5–8.1)

## 2024-05-17 LAB — CBC WITH DIFFERENTIAL/PLATELET
Abs Immature Granulocytes: 0.02 K/uL (ref 0.00–0.07)
Basophils Absolute: 0.1 K/uL (ref 0.0–0.1)
Basophils Relative: 1 %
Eosinophils Absolute: 0.4 K/uL (ref 0.0–0.5)
Eosinophils Relative: 5 %
HCT: 31 % — ABNORMAL LOW (ref 36.0–46.0)
Hemoglobin: 9.8 g/dL — ABNORMAL LOW (ref 12.0–15.0)
Immature Granulocytes: 0 %
Lymphocytes Relative: 20 %
Lymphs Abs: 1.5 K/uL (ref 0.7–4.0)
MCH: 25.9 pg — ABNORMAL LOW (ref 26.0–34.0)
MCHC: 31.6 g/dL (ref 30.0–36.0)
MCV: 82 fL (ref 80.0–100.0)
Monocytes Absolute: 0.7 K/uL (ref 0.1–1.0)
Monocytes Relative: 9 %
Neutro Abs: 5.1 K/uL (ref 1.7–7.7)
Neutrophils Relative %: 65 %
Platelets: 228 K/uL (ref 150–400)
RBC: 3.78 MIL/uL — ABNORMAL LOW (ref 3.87–5.11)
RDW: 14.8 % (ref 11.5–15.5)
WBC: 7.8 K/uL (ref 4.0–10.5)
nRBC: 0 % (ref 0.0–0.2)

## 2024-05-17 LAB — TROPONIN T, HIGH SENSITIVITY
Troponin T High Sensitivity: 34 ng/L — ABNORMAL HIGH (ref 0–19)
Troponin T High Sensitivity: 28 ng/L — ABNORMAL HIGH (ref 0–19)

## 2024-05-17 LAB — LIPASE, BLOOD: Lipase: 29 U/L (ref 11–51)

## 2024-05-17 NOTE — ED Triage Notes (Signed)
 Patient BIB EMS from [facility] with complaint of chest pain x 1 day, with cough, & hot to the touch.   GCS 14  RR 24-26 EcO2 27 Spo2 89 RA placed on 2L SpO2 97% BP 176/74 CBG 126  18G RAC  Patient reports epigastric pain starting today, reports pain has resolved at this time.

## 2024-05-17 NOTE — ED Provider Notes (Incomplete)
 "  EMERGENCY DEPARTMENT AT [facility] Provider Note   CSN: [id number] Arrival date & time: 05/17/24  1954     Patient presents with: No chief complaint on file.   Lauren Craig is a 78 y.o. female presenting to ED with complaint of substernal chest pressure.  Patient is here from [facility].  She said she has had epigastric discomfort for about a day.  It is completely gone away.  EMS reports that the patient was hypoxic for them and needed 2 L nasal cannula but she is not hypoxic on arrival.  She denies cough or shortness of breath.  She does report a history of coronary disease.  Reports a history of cholecystectomy.  I reviewed her external records and she was discharged from the hospital approximately 1 month ago.  She is noted to history of chronic kidney disease, type 2 diabetes, high blood pressure, stroke, high cholesterol, asthma.  She was evaluated and treated for UTI in the hospital, with urine culture showing no growth.  She was also treated for nausea and vomiting in the hospital.  She is noted to have chronic dysphagia.  {Add pertinent medical, surgical, social history, OB history to HPI:32947} HPI     Prior to Admission medications  Medication Sig Start Date End Date Taking? Authorizing Provider  acetaminophen  (TYLENOL ) 325 MG tablet Take 2 tablets (650 mg total) by mouth every 6 (six) hours as needed for headache. 11/01/20   Malvina Ellen, MD  albuterol  (VENTOLIN  HFA) 108 (90 Base) MCG/ACT inhaler Inhale 2 puffs into the lungs every 6 (six) hours as needed for wheezing or shortness of breath. 01/16/24   Delores Suzann HERO, MD  amLODipine  (NORVASC ) 10 MG tablet Take 1 tablet (10 mg total) by mouth daily. 01/16/24   Delores Suzann HERO, MD  aspirin  EC 81 MG tablet Take 1 tablet (81 mg total) by mouth daily. Swallow whole. 01/16/24   Delores Suzann HERO, MD  cloNIDine  (CATAPRES ) 0.1 MG tablet Take 1 tablet (0.1 mg total) by mouth 2 (two) times daily. 02/15/24   Delores Suzann HERO, MD  escitalopram  (LEXAPRO ) 20 MG tablet Take 1 tablet (20 mg total) by mouth daily. 01/16/24   Delores Suzann HERO, MD  ezetimibe  (ZETIA ) 10 MG tablet Take 1 tablet (10 mg total) by mouth daily. 02/28/24   McDiarmid, Krystal BIRCH, MD  famotidine  (PEPCID ) 10 MG tablet Take 1 tablet (10 mg total) by mouth daily. 02/02/24   Delores Suzann HERO, MD  fexofenadine  (ALLEGRA  ALLERGY) 60 MG tablet Take 1 tablet (60 mg total) by mouth 2 (two) times daily. Patient taking differently: Take 60 mg by mouth 2 (two) times daily as needed for allergies. 01/16/24   Delores Suzann HERO, MD  fluticasone  (FLONASE ) 50 MCG/ACT nasal spray SPRAY 2 SPRAYS INTO EACH NOSTRIL EVERY DAY Patient taking differently: Place 2 sprays into both nostrils daily as needed. 10/19/23   Delores Suzann HERO, MD  [Paused] furosemide  (LASIX ) 20 MG tablet Take 2 tablets (40 mg total) by mouth 2 (two) times daily. For swelling Wait to take this until your doctor or other care provider tells you to start again. 02/02/24   Delores Suzann HERO, MD  hydrALAZINE  (APRESOLINE ) 50 MG tablet Take 1 tablet by mouth 2 (two) times daily. 12/11/23   [provider]  hydrocortisone  2.5 % ointment Apply topically 2 (two) times daily. On red areas on legs Patient taking differently: Apply 1 Application topically 2 (two) times daily as needed (On red areas  on legs). 11/09/22   Jeanelle Layman CROME, MD  Nystatin  (GERHARDT'S BUTT CREAM) CREA Use twice daily in skin folds 11/27/23   Delores Suzann HERO, MD  ondansetron  (ZOFRAN -ODT) 4 MG disintegrating tablet Take 1 tablet (4 mg total) by mouth every 8 (eight) hours as needed for nausea or vomiting. 01/16/24   Delores Suzann HERO, MD  rosuvastatin  (CRESTOR ) 10 MG tablet Take 1 tablet (10 mg total) by mouth daily. 01/16/24   Delores Suzann HERO, MD  triamcinolone  (KENALOG ) 0.025 % ointment Apply 1 Application topically 2 (two) times daily. Apply to right ankle rash Patient taking differently: Apply 1 Application topically 2 (two) times daily as  needed (right ankle rash). 05/10/23   Delores Suzann HERO, MD    Allergies: Penicillins, Insulin  lispro, Insulin  lispro prot & lispro, Semaglutide, and Metformin    Review of Systems  Updated Vital Signs BP (!) 179/68   Pulse 69   Temp 98.1 F (36.7 C) (Oral)   Resp 13   Ht 5' 5 (1.651 m)   Wt 69.9 kg   SpO2 99%   BMI 25.63 kg/m   Physical Exam Constitutional:      General: She is not in acute distress. HENT:     Head: Normocephalic and atraumatic.  Eyes:     Conjunctiva/sclera: Conjunctivae normal.     Pupils: Pupils are equal, round, and reactive to light.  Cardiovascular:     Rate and Rhythm: Normal rate and regular rhythm.  Pulmonary:     Effort: Pulmonary effort is normal. No respiratory distress.  Abdominal:     General: There is no distension.     Tenderness: There is no abdominal tenderness.  Skin:    General: Skin is warm and dry.  Neurological:     General: No focal deficit present.     Mental Status: She is alert. Mental status is at baseline.  Psychiatric:        Mood and Affect: Mood normal.        Behavior: Behavior normal.     (all labs ordered are listed, but only abnormal results are displayed) Labs Reviewed  COMPREHENSIVE METABOLIC PANEL WITH GFR  LIPASE, BLOOD  CBC WITH DIFFERENTIAL/PLATELET  TROPONIN T, HIGH SENSITIVITY    EKG: None  Radiology: No results found.  {Document cardiac monitor, telemetry assessment procedure when appropriate:32947} Procedures   Medications Ordered in the ED - No data to display    {Click here for ABCD2, HEART and other calculators REFRESH Note before signing:1}                              Medical Decision Making Amount and/or Complexity of Data Reviewed Labs: ordered. Radiology: ordered.   This patient presents to the ED with concern for epigastric discomfort. This involves an extensive number of treatment options, and is a complaint that carries with it a high risk of complications and morbidity.   The differential diagnosis includes gastritis versus pancreatitis versus atypical ACS versus aspiration pneumonia versus other  Co-morbidities that complicate the patient evaluation: History of dysphagia, risk of aspiration, cardiovascular risk factors  Additional history obtained from EMS  External records from outside source obtained and reviewed including hospital discharge summary from November  I ordered and personally interpreted labs.  The pertinent results include:  ***  I ordered imaging studies including x-ray of the chest I independently visualized and interpreted imaging which showed *** I agree with the radiologist interpretation  The patient was maintained on a cardiac monitor.  I personally viewed and interpreted the cardiac monitored which showed an underlying rhythm of: Regular heart rate  Per my interpretation the patient's ECG shows no acute ischemic findings  I ordered medication including ***  for ***  I have reviewed the patients home medicines and have made adjustments as needed  Test Considered: ***  I requested consultation with the ***,  and discussed lab and imaging findings as well as pertinent plan - they recommend: ***  After the interventions noted above, I reevaluated the patient and found that they have: {resolved/improved/worsened:23923::improved}  Social Determinants of Health:***  Dispostion:  After consideration of the diagnostic results and the patients response to treatment, I feel that the patent would benefit from ***.   {Document critical care time when appropriate  Document review of labs and clinical decision tools ie CHADS2VASC2, etc  Document your independent review of radiology images and any outside records  Document your discussion with family members, caretakers and with consultants  Document social determinants of health affecting pt's care  Document your decision making why or why not admission, treatments were  needed:32947:::1}   Final diagnoses:  None    ED Discharge Orders     None        "

## 2024-05-17 NOTE — Discharge Instructions (Addendum)
 Lauren Craig's workup was reassuring in the ER, including blood tests for her heart, liver, pancreas, and her xray of the chest.  She should return to the ER for new or worsening symptoms.

## 2024-05-18 NOTE — ED Notes (Signed)
Ptar here for transport 

## 2024-05-24 ENCOUNTER — Emergency Department (HOSPITAL_COMMUNITY)
Admission: EM | Admit: 2024-05-24 | Discharge: 2024-05-24 | Disposition: A | Discharge status: Skilled Nursing Facility | Attending: Emergency Medicine | Admitting: Emergency Medicine

## 2024-05-24 ENCOUNTER — Emergency Department (HOSPITAL_COMMUNITY)

## 2024-05-24 ENCOUNTER — Other Ambulatory Visit: Payer: Self-pay

## 2024-05-24 ENCOUNTER — Encounter (HOSPITAL_COMMUNITY): Payer: Self-pay | Admitting: Emergency Medicine

## 2024-05-24 DIAGNOSIS — R072 Precordial pain: Secondary | ICD-10-CM | POA: Diagnosis present

## 2024-05-24 DIAGNOSIS — Z8673 Personal history of transient ischemic attack (TIA), and cerebral infarction without residual deficits: Secondary | ICD-10-CM | POA: Insufficient documentation

## 2024-05-24 DIAGNOSIS — N189 Chronic kidney disease, unspecified: Secondary | ICD-10-CM | POA: Diagnosis not present

## 2024-05-24 DIAGNOSIS — Z7982 Long term (current) use of aspirin: Secondary | ICD-10-CM | POA: Diagnosis not present

## 2024-05-24 DIAGNOSIS — E1122 Type 2 diabetes mellitus with diabetic chronic kidney disease: Secondary | ICD-10-CM | POA: Insufficient documentation

## 2024-05-24 DIAGNOSIS — R079 Chest pain, unspecified: Secondary | ICD-10-CM

## 2024-05-24 LAB — COMPREHENSIVE METABOLIC PANEL WITH GFR
ALT: 5 U/L (ref 0–44)
AST: 18 U/L (ref 15–41)
Albumin: 3.4 g/dL — ABNORMAL LOW (ref 3.5–5.0)
Alkaline Phosphatase: 81 U/L (ref 38–126)
Anion gap: 10 (ref 5–15)
BUN: 27 mg/dL — ABNORMAL HIGH (ref 8–23)
CO2: 21 mmol/L — ABNORMAL LOW (ref 22–32)
Calcium: 8.5 mg/dL — ABNORMAL LOW (ref 8.9–10.3)
Chloride: 106 mmol/L (ref 98–111)
Creatinine, Ser: 2 mg/dL — ABNORMAL HIGH (ref 0.44–1.00)
GFR, Estimated: 25 mL/min — ABNORMAL LOW
Glucose, Bld: 129 mg/dL — ABNORMAL HIGH (ref 70–99)
Potassium: 4.8 mmol/L (ref 3.5–5.1)
Sodium: 136 mmol/L (ref 135–145)
Total Bilirubin: 0.2 mg/dL (ref 0.0–1.2)
Total Protein: 6.5 g/dL (ref 6.5–8.1)

## 2024-05-24 LAB — TROPONIN T, HIGH SENSITIVITY
Troponin T High Sensitivity: 33 ng/L — ABNORMAL HIGH (ref 0–19)
Troponin T High Sensitivity: 36 ng/L — ABNORMAL HIGH (ref 0–19)

## 2024-05-24 LAB — CBC
HCT: 29.2 % — ABNORMAL LOW (ref 36.0–46.0)
Hemoglobin: 9.2 g/dL — ABNORMAL LOW (ref 12.0–15.0)
MCH: 25.6 pg — ABNORMAL LOW (ref 26.0–34.0)
MCHC: 31.5 g/dL (ref 30.0–36.0)
MCV: 81.3 fL (ref 80.0–100.0)
Platelets: 255 K/uL (ref 150–400)
RBC: 3.59 MIL/uL — ABNORMAL LOW (ref 3.87–5.11)
RDW: 15.2 % (ref 11.5–15.5)
WBC: 6.9 K/uL (ref 4.0–10.5)
nRBC: 0 % (ref 0.0–0.2)

## 2024-05-24 LAB — LIPASE, BLOOD: Lipase: 32 U/L (ref 11–51)

## 2024-05-24 MED ORDER — HYDRALAZINE HCL 20 MG/ML IJ SOLN
10.0000 mg | Freq: Once | INTRAMUSCULAR | Status: AC
  Administered 2024-05-24: 10 mg via INTRAVENOUS
  Filled 2024-05-24: qty 1

## 2024-05-24 MED ORDER — ALUM & MAG HYDROXIDE-SIMETH 200-200-20 MG/5ML PO SUSP
30.0000 mL | Freq: Once | ORAL | Status: DC
  Filled 2024-05-24: qty 30

## 2024-05-24 MED ORDER — FAMOTIDINE 10 MG PO TABS
10.0000 mg | ORAL_TABLET | Freq: Every day | ORAL | 3 refills | Status: AC
  Filled 2024-05-24: qty 90, 90d supply, fill #0

## 2024-05-24 MED ORDER — MORPHINE SULFATE (PF) 2 MG/ML IV SOLN
2.0000 mg | Freq: Once | INTRAVENOUS | Status: AC
  Administered 2024-05-24: 2 mg via INTRAVENOUS
  Filled 2024-05-24: qty 1

## 2024-05-24 MED ORDER — SODIUM CHLORIDE 0.9 % IV BOLUS
500.0000 mL | Freq: Once | INTRAVENOUS | Status: AC
  Administered 2024-05-24: 500 mL via INTRAVENOUS

## 2024-05-24 MED ORDER — FAMOTIDINE 20 MG PO TABS
20.0000 mg | ORAL_TABLET | Freq: Once | ORAL | Status: AC
  Administered 2024-05-24: 20 mg via ORAL
  Filled 2024-05-24: qty 1

## 2024-05-24 MED ORDER — SUCRALFATE 1 G PO TABS
1.0000 g | ORAL_TABLET | Freq: Once | ORAL | Status: AC
  Administered 2024-05-24: 1 g via ORAL
  Filled 2024-05-24: qty 1

## 2024-05-24 NOTE — ED Notes (Signed)
 Ptar called

## 2024-05-24 NOTE — ED Provider Notes (Signed)
 " [place] EMERGENCY DEPARTMENT AT [facility] Provider Note   CSN: [id number] Arrival date & time: 05/24/24  1414     Patient presents with: Chest Pain   Lauren Craig is a 78 y.o. female coming ED with acute onset substernal chest pain and pressure that radiates to her left shoulder.  Patient reports this began earlier this morning.  It has been persistent.  She has similar symptoms in the past, though not frequently.  She denies shortness of breath, lightheadedness, cough or loss conscious.  She was given her 24 mg aspirin  by EMS as well as 3 several nitroglycerin tablets by her facility, with no improvement of her symptoms after nitroglycerin.  I did review his most recent hospitalization from last month, where the patient was noted to have UTI, nausea and vomiting, dysphagia, and a history of stroke in the past.  She is also noted to have high blood pressure, type 2 diabetes, chronic kidney disease and peripheral vascular disease.   HPI     Prior to Admission medications  Medication Sig Start Date End Date Taking? Authorizing Provider  acetaminophen  (TYLENOL ) 325 MG tablet Take 2 tablets (650 mg total) by mouth every 6 (six) hours as needed for headache. 11/01/20   Malvina Ellen, MD  albuterol  (VENTOLIN  HFA) 108 (90 Base) MCG/ACT inhaler Inhale 2 puffs into the lungs every 6 (six) hours as needed for wheezing or shortness of breath. 01/16/24   Delores Suzann HERO, MD  amLODipine  (NORVASC ) 10 MG tablet Take 1 tablet (10 mg total) by mouth daily. 01/16/24   Delores Suzann HERO, MD  aspirin  EC 81 MG tablet Take 1 tablet (81 mg total) by mouth daily. Swallow whole. 01/16/24   Delores Suzann HERO, MD  cloNIDine  (CATAPRES ) 0.1 MG tablet Take 1 tablet (0.1 mg total) by mouth 2 (two) times daily. 02/15/24   Delores Suzann HERO, MD  escitalopram  (LEXAPRO ) 20 MG tablet Take 1 tablet (20 mg total) by mouth daily. 01/16/24   Delores Suzann HERO, MD  ezetimibe  (ZETIA ) 10 MG tablet Take 1 tablet (10 mg total)  by mouth daily. 02/28/24   McDiarmid, Krystal BIRCH, MD  famotidine  (PEPCID ) 10 MG tablet Take 1 tablet (10 mg total) by mouth daily. 02/02/24   Delores Suzann HERO, MD  fexofenadine  (ALLEGRA  ALLERGY) 60 MG tablet Take 1 tablet (60 mg total) by mouth 2 (two) times daily. Patient taking differently: Take 60 mg by mouth 2 (two) times daily as needed for allergies. 01/16/24   Delores Suzann HERO, MD  fluticasone  (FLONASE ) 50 MCG/ACT nasal spray SPRAY 2 SPRAYS INTO EACH NOSTRIL EVERY DAY Patient taking differently: Place 2 sprays into both nostrils daily as needed. 10/19/23   Delores Suzann HERO, MD  [Paused] furosemide  (LASIX ) 20 MG tablet Take 2 tablets (40 mg total) by mouth 2 (two) times daily. For swelling Wait to take this until your doctor or other care provider tells you to start again. 02/02/24   Delores Suzann HERO, MD  hydrALAZINE  (APRESOLINE ) 50 MG tablet Take 1 tablet by mouth 2 (two) times daily. 12/11/23   [provider]  hydrocortisone  2.5 % ointment Apply topically 2 (two) times daily. On red areas on legs Patient taking differently: Apply 1 Application topically 2 (two) times daily as needed (On red areas on legs). 11/09/22   Jeanelle Layman CROME, MD  Nystatin  (GERHARDT'S BUTT CREAM) CREA Use twice daily in skin folds 11/27/23   Delores Suzann HERO, MD  ondansetron  (ZOFRAN -ODT) 4 MG disintegrating tablet Take  1 tablet (4 mg total) by mouth every 8 (eight) hours as needed for nausea or vomiting. 01/16/24   Delores Suzann HERO, MD  rosuvastatin  (CRESTOR ) 10 MG tablet Take 1 tablet (10 mg total) by mouth daily. 01/16/24   Delores Suzann HERO, MD  triamcinolone  (KENALOG ) 0.025 % ointment Apply 1 Application topically 2 (two) times daily. Apply to right ankle rash Patient taking differently: Apply 1 Application topically 2 (two) times daily as needed (right ankle rash). 05/10/23   Delores Suzann HERO, MD    Allergies: Penicillins, Insulin  lispro, Insulin  lispro prot & lispro, Semaglutide, and Metformin    Review of  Systems  Updated Vital Signs BP 139/84 (BP Location: Right Arm)   Pulse (!) 59   Temp 97.7 F (36.5 C) (Oral)   Resp 16   Ht 5' 5 (1.651 m)   Wt 68.9 kg   SpO2 99%   BMI 25.29 kg/m   Physical Exam Constitutional:      General: She is not in acute distress. HENT:     Head: Normocephalic and atraumatic.  Eyes:     Conjunctiva/sclera: Conjunctivae normal.     Pupils: Pupils are equal, round, and reactive to light.  Cardiovascular:     Rate and Rhythm: Normal rate and regular rhythm.  Pulmonary:     Effort: Pulmonary effort is normal. No respiratory distress.  Abdominal:     General: There is no distension.     Tenderness: There is no abdominal tenderness.  Skin:    General: Skin is warm and dry.  Neurological:     General: No focal deficit present.     Mental Status: She is alert. Mental status is at baseline.  Psychiatric:        Mood and Affect: Mood normal.        Behavior: Behavior normal.     (all labs ordered are listed, but only abnormal results are displayed) Labs Reviewed - No data to display  EKG: EKG Interpretation Date/Time:  Friday May 24 2024 14:24:45 EST Ventricular Rate:  59 PR Interval:  201 QRS Duration:  147 QT Interval:  466 QTC Calculation: 462 R Axis:   -71  Text Interpretation: Sinus rhythm Right bundle branch block No significant changes from prior tracing Confirmed by Cottie Cough [phone]) on 05/24/2024 2:35:09 PM  Radiology: No results found.   Procedures   Medications Ordered in the ED - No data to display                                  Medical Decision Making Amount and/or Complexity of Data Reviewed Labs: ordered. Radiology: ordered.  Risk OTC drugs. Prescription drug management.   This patient presents to the ED with concern for chest pain and epigastric discomfort. This involves an extensive number of treatment options, and is a complaint that carries with it a high risk of complications and morbidity.   The differential diagnosis includes gastritis versus reflux versus pneumonia versus ACS versus other  Co-morbidities that complicate the patient evaluation: Cardiovascular risk factor  Additional history obtained from EMS   I ordered and personally interpreted labs.  The pertinent results include: Labs pending at signout  I ordered imaging studies including x-ray of the chest I independently visualized and interpreted imaging which showed no emergent findings I agree with the radiologist interpretation  The patient was maintained on a cardiac monitor.  I personally viewed and interpreted the  cardiac monitored which showed an underlying rhythm of: Sinus rhythm  Per my interpretation the patient's ECG shows no acute ischemia  I ordered medication including medications for suspected GI reflux gastritis.  I have reviewed the patients home medicines and have made adjustments as needed  Test Considered: Lower suspicion for aortic dissection or acute pulmonary embolism based on initial presentation.    Dispostion:  Patient signed out to Dr Roxie Bough EDP at 4 pm pending follow up labs and symptom reassessment after GI medications.      Final diagnoses:  None    ED Discharge Orders     None          Cottie Donnice PARAS, MD 05/25/24 [phone]  "

## 2024-05-24 NOTE — ED Provider Notes (Signed)
 Patient signed out to me by previous provider. Please refer to their note for full HPI.  Briefly this is a 78 year old female who presented with sudden onset epigastric pain, midsternal with radiation to the left arm.  Associated with mild nausea.  Slight improvement after nitroglycerin and a baby aspirin .   Patient signed out pending medications, reevaluation, lab results.   ED Course: EKG shows right bundle branch block which is baseline for the patient.  Chest x-ray is unremarkable.  Blood work shows ongoing anemia and slight elevation in baseline kidney dysfunction.  No black stools or blood in stool. Initial troponin is 33, but comparable to levels when evaluated a week ago.  Patient has been seen before for epigastric discomfort, nausea/vomiting.  She states that this feels similar, especially to when she was evaluated about a week ago.  Because of this I have lower suspicion for dissection or other acute chest process.  After medications, GI cocktail she is feeling improved.  She now states that she is symptom-free.  Discussed with her establishing outpatient care with gastroenterology for further evaluation and treatment.  It is not appear that she is taking her daily Pepcid  so I have refilled this prescription and encouraged compliance.  Patient at this time appears safe and stable for discharge and close outpatient follow up. Discharge plan and strict return to ED precautions discussed, patient verbalizes understanding and agreement.     SABRAUltrasound ED Peripheral IV (Provider)  Date/Time: 05/24/2024 6:10 PM  Performed by: Bari Roxie HERO, DO Authorized by: Bari Roxie HERO, DO   Procedure details:    Indications: multiple failed IV attempts and poor IV access     Skin Prep: chlorhexidine  gluconate     Location:  Left AC   Angiocath:  20 G   Bedside Ultrasound Guided: Yes     Images: not archived     Patient tolerated procedure without complications: Yes     Dressing applied:  Yes       Bari Roxie HERO, DO 05/24/24 2130

## 2024-05-24 NOTE — ED Notes (Signed)
 Attempted to contact emergency contacts to give a report however they did not answer. I also called [facility] the facility and was able to reach them and give report. Patient stated to transport when they arrived that  she did not want to go back to the facility because they are mean to her

## 2024-05-24 NOTE — ED Triage Notes (Signed)
 Patient BIB [facility] from [facility] with sudden onset of chest pain describe as a pressure in mid chest, that radiate to the left arm. She was given 3 Nitroglycerin Sublingual by the facility with no relief. Her pain is 7/10. EMS gave her Aspirin  324 mg. A & O x4. Vital signs: Bp128/48, HR 68, Spo2 98% RA, and CBG 232.

## 2024-05-24 NOTE — Discharge Instructions (Addendum)
 You have been seen and discharged from the emergency department.  Your blood workup and heart workup were baseline for you.  You were treated with IV and oral medicine with improvement.  Your symptoms may be GI related.  I have attached a group that you can call to establish outpatient care for further evaluation/treatment.  It is important to continue taking Pepcid  daily, this prescription has been refilled.  Follow-up with your primary provider for further evaluation and further care. Take home medications as prescribed. If you have any worsening symptoms or further concerns for your health please return to an emergency department for further evaluation.

## 2024-05-25 ENCOUNTER — Other Ambulatory Visit (HOSPITAL_COMMUNITY): Payer: Self-pay

## 2024-06-08 ENCOUNTER — Ambulatory Visit: Attending: Family Medicine

## 2024-06-17 ENCOUNTER — Emergency Department (HOSPITAL_COMMUNITY)
Admission: EM | Admit: 2024-06-17 | Discharge: 2024-06-18 | Disposition: A | Discharge status: Home / Self Care | Attending: Emergency Medicine | Admitting: Emergency Medicine

## 2024-06-17 ENCOUNTER — Other Ambulatory Visit: Payer: Self-pay

## 2024-06-17 ENCOUNTER — Emergency Department (HOSPITAL_COMMUNITY)

## 2024-06-17 DIAGNOSIS — Z7982 Long term (current) use of aspirin: Secondary | ICD-10-CM | POA: Insufficient documentation

## 2024-06-17 DIAGNOSIS — N390 Urinary tract infection, site not specified: Secondary | ICD-10-CM | POA: Insufficient documentation

## 2024-06-17 DIAGNOSIS — Z8673 Personal history of transient ischemic attack (TIA), and cerebral infarction without residual deficits: Secondary | ICD-10-CM | POA: Insufficient documentation

## 2024-06-17 DIAGNOSIS — R7989 Other specified abnormal findings of blood chemistry: Secondary | ICD-10-CM | POA: Insufficient documentation

## 2024-06-17 DIAGNOSIS — R1030 Lower abdominal pain, unspecified: Secondary | ICD-10-CM | POA: Diagnosis present

## 2024-06-17 LAB — BASIC METABOLIC PANEL WITH GFR
Anion gap: 15 (ref 5–15)
BUN: 45 mg/dL — ABNORMAL HIGH (ref 8–23)
CO2: 20 mmol/L — ABNORMAL LOW (ref 22–32)
Calcium: 9 mg/dL (ref 8.9–10.3)
Chloride: 104 mmol/L (ref 98–111)
Creatinine, Ser: 2.34 mg/dL — ABNORMAL HIGH (ref 0.44–1.00)
GFR, Estimated: 21 mL/min — ABNORMAL LOW
Glucose, Bld: 162 mg/dL — ABNORMAL HIGH (ref 70–99)
Potassium: 4.2 mmol/L (ref 3.5–5.1)
Sodium: 139 mmol/L (ref 135–145)

## 2024-06-17 LAB — URINALYSIS, ROUTINE W REFLEX MICROSCOPIC
Bilirubin Urine: NEGATIVE
Glucose, UA: NEGATIVE mg/dL
Ketones, ur: NEGATIVE mg/dL
Nitrite: NEGATIVE
Protein, ur: 30 mg/dL — AB
Specific Gravity, Urine: 1.008 (ref 1.005–1.030)
WBC, UA: >50 WBC/hpf (ref 0–5)
pH: 5 (ref 5.0–8.0)

## 2024-06-17 LAB — CBC
HCT: 33.3 % — ABNORMAL LOW (ref 36.0–46.0)
Hemoglobin: 10.6 g/dL — ABNORMAL LOW (ref 12.0–15.0)
MCH: 25.2 pg — ABNORMAL LOW (ref 26.0–34.0)
MCHC: 31.8 g/dL (ref 30.0–36.0)
MCV: 79.3 fL — ABNORMAL LOW (ref 80.0–100.0)
Platelets: 352 K/uL (ref 150–400)
RBC: 4.2 MIL/uL (ref 3.87–5.11)
RDW: 14.7 % (ref 11.5–15.5)
WBC: 9.6 K/uL (ref 4.0–10.5)
nRBC: 0 % (ref 0.0–0.2)

## 2024-06-17 LAB — TROPONIN T, HIGH SENSITIVITY
Troponin T High Sensitivity: 41 ng/L — ABNORMAL HIGH (ref 0–19)
Troponin T High Sensitivity: 35 ng/L — ABNORMAL HIGH (ref 0–19)

## 2024-06-17 LAB — HEPATIC FUNCTION PANEL
ALT: 7 U/L (ref 0–44)
AST: 10 U/L — ABNORMAL LOW (ref 15–41)
Albumin: 3.9 g/dL (ref 3.5–5.0)
Alkaline Phosphatase: 98 U/L (ref 38–126)
Bilirubin, Direct: 0.1 mg/dL (ref 0.0–0.2)
Indirect Bilirubin: 0.2 mg/dL — ABNORMAL LOW (ref 0.3–0.9)
Total Bilirubin: 0.3 mg/dL (ref 0.0–1.2)
Total Protein: 7.5 g/dL (ref 6.5–8.1)

## 2024-06-17 MED ORDER — SODIUM CHLORIDE 0.9 % IV SOLN
1.0000 g | Freq: Once | INTRAVENOUS | Status: AC
  Administered 2024-06-17: 1 g via INTRAVENOUS
  Filled 2024-06-17: qty 10

## 2024-06-17 MED ORDER — FUROSEMIDE 10 MG/ML IJ SOLN
20.0000 mg | Freq: Once | INTRAMUSCULAR | Status: AC
  Administered 2024-06-17: 20 mg via INTRAVENOUS
  Filled 2024-06-17: qty 2

## 2024-06-17 MED ORDER — CEPHALEXIN 500 MG PO CAPS: 500.0000 mg | ORAL_CAPSULE | Freq: Four times a day (QID) | ORAL | 0 refills | Status: AC

## 2024-06-17 NOTE — ED Notes (Signed)
 Report given to [facility] and all questions answered. Awaiting ptar

## 2024-06-17 NOTE — Discharge Instructions (Signed)
 You have been seen and discharged from the emergency department.  You have been diagnosed with a urinary tract infection.  Take antibiotic as directed.  Urine culture has been sent.  Follow-up with your primary provider for further evaluation and further care. Take home medications as prescribed. If you have any worsening symptoms or further concerns for your health please return to an emergency department for further evaluation.

## 2024-06-17 NOTE — ED Notes (Signed)
 Patient transported to X-ray

## 2024-06-17 NOTE — ED Provider Notes (Signed)
 " [facility] Provider Note   CSN: [id number] Arrival date & time: 06/17/24  1546     Patient presents with: Altered Mental Status and Chest Pain   Lauren Craig is a 79 y.o. female.   HPI   79 year old female presents from [facility] for evaluation.  Report from staff is that she was complaining of chest pain and altered.  On my evaluation patient is complaining of lower abdominal discomfort.  She has minimal and her answers, sometimes only saying yes to answers.  But when pressed has correct answers and is alert and oriented x 3.  Prior to Admission medications  Medication Sig Start Date End Date Taking? Authorizing Provider  cephALEXin  (KEFLEX ) 500 MG capsule Take 1 capsule (500 mg total) by mouth 4 (four) times daily. 06/17/24  Yes Raunel Dimartino M, DO  acetaminophen  (TYLENOL ) 325 MG tablet Take 2 tablets (650 mg total) by mouth every 6 (six) hours as needed for headache. Patient not taking: Reported on 05/24/2024 11/01/20   Malvina Ellen, MD  albuterol  (VENTOLIN  HFA) [phone] Base) MCG/ACT inhaler Inhale 2 puffs into the lungs every 6 (six) hours as needed for wheezing or shortness of breath. 01/16/24   Delores Suzann HERO, MD  amLODipine  (NORVASC ) 10 MG tablet Take 1 tablet (10 mg total) by mouth daily. 01/16/24   Delores Suzann HERO, MD  aspirin  EC 81 MG tablet Take 1 tablet (81 mg total) by mouth daily. Swallow whole. Patient not taking: Reported on 05/24/2024 01/16/24   Delores Suzann HERO, MD  cloNIDine  (CATAPRES ) 0.1 MG tablet Take 1 tablet (0.1 mg total) by mouth 2 (two) times daily. 02/15/24   Delores Suzann HERO, MD  escitalopram  (LEXAPRO ) 20 MG tablet Take 1 tablet (20 mg total) by mouth daily. Patient not taking: Reported on 05/24/2024 01/16/24   Delores Suzann HERO, MD  ezetimibe  (ZETIA ) 10 MG tablet Take 1 tablet (10 mg total) by mouth daily. 02/28/24   McDiarmid, Krystal BIRCH, MD  famotidine  (PEPCID ) 10 MG tablet Take 1 tablet (10 mg total) by mouth daily.  05/24/24   Arshia Spellman, Roxie HERO, DO  fexofenadine  (ALLEGRA  ALLERGY) 60 MG tablet Take 1 tablet (60 mg total) by mouth 2 (two) times daily. Patient taking differently: Take 60 mg by mouth 2 (two) times daily as needed for allergies. 01/16/24   Delores Suzann HERO, MD  fluticasone  (FLONASE ) 50 MCG/ACT nasal spray SPRAY 2 SPRAYS INTO EACH NOSTRIL EVERY DAY Patient taking differently: Place 2 sprays into both nostrils daily as needed for allergies. 10/19/23   Delores Suzann HERO, MD  [Paused] furosemide  (LASIX ) 20 MG tablet Take 2 tablets (40 mg total) by mouth 2 (two) times daily. For swelling Wait to take this until your doctor or other care provider tells you to start again. 02/02/24   Delores Suzann HERO, MD  hydrALAZINE  (APRESOLINE ) 50 MG tablet Take 1 tablet by mouth 2 (two) times daily. 12/11/23   [provider]  hydrocortisone  2.5 % ointment Apply topically 2 (two) times daily. On red areas on legs Patient taking differently: Apply 1 Application topically 2 (two) times daily as needed (On red areas on legs). 11/09/22   Jeanelle Layman CROME, MD  Nystatin  (GERHARDT'S BUTT CREAM) CREA Use twice daily in skin folds 11/27/23   Delores Suzann HERO, MD  ondansetron  (ZOFRAN -ODT) 4 MG disintegrating tablet Take 1 tablet (4 mg total) by mouth every 8 (eight) hours as needed for nausea or vomiting. Patient not taking: Reported on 05/24/2024  01/16/24   Delores Suzann HERO, MD  rosuvastatin  (CRESTOR ) 10 MG tablet Take 1 tablet (10 mg total) by mouth daily. 01/16/24   Delores Suzann HERO, MD  triamcinolone  (KENALOG ) 0.025 % ointment Apply 1 Application topically 2 (two) times daily. Apply to right ankle rash Patient not taking: Reported on 05/24/2024 05/10/23   Delores Suzann HERO, MD    Allergies: Penicillins, Insulin  lispro, Insulin  lispro prot & lispro, and Metformin    Review of Systems  Constitutional:  Negative for fever.  Respiratory:  Negative for shortness of breath.   Cardiovascular:  Negative for chest pain.   Gastrointestinal:  Positive for abdominal pain. Negative for diarrhea and vomiting.  Skin:  Negative for rash.  Neurological:  Negative for headaches.    Updated Vital Signs BP (!) 149/55   Pulse (!) 54   Temp 98 F (36.7 C)   Resp 15   Ht 5' 5 (1.651 m)   Wt 68.9 kg   SpO2 98%   BMI 25.29 kg/m   Physical Exam Vitals and nursing note reviewed.  Constitutional:      Appearance: Normal appearance.  HENT:     Head: Normocephalic.     Mouth/Throat:     Mouth: Mucous membranes are moist.  Cardiovascular:     Rate and Rhythm: Normal rate.  Pulmonary:     Effort: Pulmonary effort is normal. No respiratory distress.  Abdominal:     Palpations: Abdomen is soft.     Comments: Suprapubic tenderness  Skin:    General: Skin is warm.  Neurological:     Mental Status: She is alert and oriented to person, place, and time. Mental status is at baseline.  Psychiatric:        Mood and Affect: Mood normal.     (all labs ordered are listed, but only abnormal results are displayed) Labs Reviewed  BASIC METABOLIC PANEL WITH GFR - Abnormal; Notable for the following components:      Result Value   CO2 20 (*)    Glucose, Bld 162 (*)    BUN 45 (*)    Creatinine, Ser 2.34 (*)    GFR, Estimated 21 (*)    All other components within normal limits  CBC - Abnormal; Notable for the following components:   Hemoglobin 10.6 (*)    HCT 33.3 (*)    MCV 79.3 (*)    MCH 25.2 (*)    All other components within normal limits  URINALYSIS, ROUTINE W REFLEX MICROSCOPIC - Abnormal; Notable for the following components:   APPearance CLOUDY (*)    Hgb urine dipstick SMALL (*)    Protein, ur 30 (*)    Leukocytes,Ua LARGE (*)    Bacteria, UA MANY (*)    All other components within normal limits  HEPATIC FUNCTION PANEL - Abnormal; Notable for the following components:   AST 10 (*)    Indirect Bilirubin 0.2 (*)    All other components within normal limits  TROPONIN T, HIGH SENSITIVITY - Abnormal;  Notable for the following components:   Troponin T High Sensitivity 41 (*)    All other components within normal limits  TROPONIN T, HIGH SENSITIVITY - Abnormal; Notable for the following components:   Troponin T High Sensitivity 35 (*)    All other components within normal limits  URINE CULTURE    EKG: None  Radiology: DG Chest 2 View Result Date: 06/17/2024 EXAM: 2 VIEW(S) XRAY OF THE CHEST 06/17/2024 05:59:00 PM COMPARISON: 05/24/2024 CLINICAL HISTORY: chest  pain FINDINGS: LUNGS AND PLEURA: Interstitial prominence could reflect early interstitial edema. Low lung volumes. No focal pulmonary opacity. No pleural effusion. No pneumothorax. HEART AND MEDIASTINUM: Cardiomegaly. Vascular congestion. BONES AND SOFT TISSUES: No acute osseous abnormality. IMPRESSION: 1. Cardiomegaly and vascular congestion. 2. Interstitial prominence, possibly reflecting early interstitial edema. 3. Low lung volumes. Electronically signed by: Franky Crease MD 06/17/2024 06:14 PM EST RP Workstation: [id number]     Procedures   Medications Ordered in the ED  cefTRIAXone  (ROCEPHIN ) 1 g in sodium chloride  0.9 % 100 mL IVPB (0 g Intravenous Stopped 06/17/24 2048)  furosemide  (LASIX ) injection 20 mg (20 mg Intravenous Given 06/17/24 2018)                                    Medical Decision Making Amount and/or Complexity of Data Reviewed Labs: ordered. Radiology: ordered.  Risk Prescription drug management.   79 year old female presents emergency department originally with the report of possible confusion and chest pain but her main complaint for me is lower abdominal pain.  Vitals are normal and stable.  She is alert and oriented x 3.  Vital signs are stable.  EKG is unchanged for the patient.  Blood work shows a baseline anemia, baseline AKI, normal LFT/bilirubin.  Urinalysis looks like a urinary tract infection, urine culture sent.  Troponins are slightly elevated but flat and around baseline.  Chest x-ray  shows mild cardiomegaly and congestion.  Patient is noted to be on Lasix , small dose ordered.  Otherwise no respiratory symptoms/decline.  Will treat the patient for urinary tract infection.  Urine culture has been sent.  Will plan to discharge with oral antibiotics.  She is tolerating p.o., alert and oriented x 3.  Patient at this time appears safe and stable for discharge and close outpatient follow up. Discharge plan and strict return to ED precautions discussed, patient verbalizes understanding and agreement.      Final diagnoses:  Urinary tract infection without hematuria, site unspecified    ED Discharge Orders          Ordered    cephALEXin  (KEFLEX ) 500 MG capsule  4 times daily        06/17/24 2240               Tilmon Wisehart, Roxie HERO, DO 06/17/24 2318  "

## 2024-06-17 NOTE — ED Triage Notes (Signed)
 Pt bib [facility] from Lauren Craig. Staff reported pt has been having CP all day. A&Ox2, baseline A&Ox4. Answering yes to everything. Hx of stroke. C/O chest pain

## 2024-06-20 ENCOUNTER — Other Ambulatory Visit: Payer: Self-pay

## 2024-06-21 LAB — URINE CULTURE: Culture: >=100000 — AB

## 2024-06-22 ENCOUNTER — Telehealth (HOSPITAL_BASED_OUTPATIENT_CLINIC_OR_DEPARTMENT_OTHER): Payer: Self-pay | Admitting: *Deleted

## 2024-06-22 NOTE — Telephone Encounter (Signed)
 Post ED Visit - Positive Culture Follow-up  Culture report reviewed by antimicrobial stewardship pharmacist: [facility] []  Rankin Dee, Pharm.D. []  Venetia Gully, Pharm.D., BCPS AQ-ID []  Garrel Crews, Pharm.D., BCPS []  Almarie Lunger, [address].D., BCPS []  [place], [place].D., BCPS, AAHIVP []  Rosaline Bihari, Pharm.D., BCPS, AAHIVP []  Vernell Meier, PharmD, BCPS []  Latanya Hint, PharmD, BCPS []  Donald Medley, PharmD, BCPS []  Rocky Bold, PharmD []  Dorothyann Alert, PharmD, BCPS [x]  Dorn Poot, PharmD  [facility] []  Rosaline Edison, PharmD []  Romona Bliss, PharmD []  Dolphus Roller, PharmD []  Veva Seip, Rph []  Vernell Daunt) Leonce, PharmD []  Eva Allis, PharmD []  Rosaline Millet, PharmD []  Iantha Batch, PharmD []  Arvin Gauss, PharmD []  Wanda Hasting, PharmD []  Ronal Rav, PharmD []  Rocky Slade, PharmD []  Bard Jeans, PharmD   Positive urine culture Treated with Cephalexin , organism sensitive to the same and no further patient follow-up is required at this time.  Albino Alan Novak 06/22/2024, 10:38 AM

## 2024-06-26 ENCOUNTER — Encounter: Payer: Self-pay | Admitting: Pharmacist

## 2024-06-26 NOTE — Progress Notes (Signed)
 This patient is appearing on a report for being at risk of failing the adherence measure for cholesterol (statin) medications this calendar year.   Medication: Rosuvastatin  Last fill date: 10/25 for 30 day supply  Attempted phone call - no answer

## 2024-07-09 ENCOUNTER — Ambulatory Visit

## 2024-08-09 ENCOUNTER — Ambulatory Visit

## 2024-09-09 ENCOUNTER — Ambulatory Visit

## 2024-10-10 ENCOUNTER — Ambulatory Visit

## 2024-11-10 ENCOUNTER — Ambulatory Visit

## 2024-12-11 ENCOUNTER — Ambulatory Visit

## 2025-01-11 ENCOUNTER — Ambulatory Visit
